# Patient Record
Sex: Female | Born: 1935 | Race: White | Hispanic: No | Marital: Married | Smoking: Former smoker
Health system: Southern US, Community
[De-identification: ages and names within clinical notes are randomized; demographics above are authoritative.]

## PROBLEM LIST (undated history)

## (undated) DIAGNOSIS — I509 Heart failure, unspecified: Secondary | ICD-10-CM

## (undated) DIAGNOSIS — E039 Hypothyroidism, unspecified: Secondary | ICD-10-CM

## (undated) DIAGNOSIS — I779 Disorder of arteries and arterioles, unspecified: Secondary | ICD-10-CM

## (undated) DIAGNOSIS — I10 Essential (primary) hypertension: Secondary | ICD-10-CM

## (undated) DIAGNOSIS — E785 Hyperlipidemia, unspecified: Secondary | ICD-10-CM

## (undated) DIAGNOSIS — R0602 Shortness of breath: Secondary | ICD-10-CM

## (undated) DIAGNOSIS — G459 Transient cerebral ischemic attack, unspecified: Secondary | ICD-10-CM

## (undated) DIAGNOSIS — I739 Peripheral vascular disease, unspecified: Secondary | ICD-10-CM

## (undated) DIAGNOSIS — I4891 Unspecified atrial fibrillation: Secondary | ICD-10-CM

## (undated) HISTORY — DX: Hyperlipidemia, unspecified: E78.5

## (undated) HISTORY — DX: Hypothyroidism, unspecified: E03.9

## (undated) HISTORY — DX: Disorder of arteries and arterioles, unspecified: I77.9

## (undated) HISTORY — DX: Shortness of breath: R06.02

## (undated) HISTORY — DX: Essential (primary) hypertension: I10

## (undated) HISTORY — DX: Peripheral vascular disease, unspecified: I73.9

## (undated) HISTORY — DX: Unspecified atrial fibrillation: I48.91

## (undated) HISTORY — DX: Transient cerebral ischemic attack, unspecified: G45.9

## (undated) HISTORY — PX: CHOLECYSTECTOMY: SHX55

---

## 1973-12-23 HISTORY — PX: ABDOMINAL HYSTERECTOMY: SHX81

## 1998-05-04 ENCOUNTER — Ambulatory Visit (HOSPITAL_COMMUNITY): Admission: RE | Admit: 1998-05-04 | Discharge: 1998-05-04 | Payer: Self-pay | Admitting: Pediatrics

## 1998-06-01 ENCOUNTER — Ambulatory Visit: Admission: RE | Admit: 1998-06-01 | Discharge: 1998-06-01 | Payer: Self-pay | Admitting: Vascular Surgery

## 1998-06-01 ENCOUNTER — Ambulatory Visit (HOSPITAL_COMMUNITY): Admission: RE | Admit: 1998-06-01 | Discharge: 1998-06-01 | Payer: Self-pay | Admitting: Vascular Surgery

## 1998-06-15 ENCOUNTER — Inpatient Hospital Stay: Admission: RE | Admit: 1998-06-15 | Discharge: 1998-06-16 | Payer: Self-pay | Admitting: Vascular Surgery

## 1999-12-24 DIAGNOSIS — G459 Transient cerebral ischemic attack, unspecified: Secondary | ICD-10-CM

## 1999-12-24 HISTORY — PX: CAROTID ENDARTERECTOMY: SUR193

## 1999-12-24 HISTORY — DX: Transient cerebral ischemic attack, unspecified: G45.9

## 2000-04-09 ENCOUNTER — Encounter: Payer: Self-pay | Admitting: Internal Medicine

## 2000-04-09 ENCOUNTER — Ambulatory Visit (HOSPITAL_COMMUNITY): Admission: RE | Admit: 2000-04-09 | Discharge: 2000-04-09 | Payer: Self-pay | Admitting: Obstetrics

## 2007-02-23 ENCOUNTER — Emergency Department (HOSPITAL_COMMUNITY): Admission: EM | Admit: 2007-02-23 | Discharge: 2007-02-23 | Payer: Self-pay | Admitting: Emergency Medicine

## 2007-02-27 ENCOUNTER — Ambulatory Visit: Payer: Self-pay | Admitting: Family Medicine

## 2007-02-27 LAB — CONVERTED CEMR LAB
ALT: 24 units/L (ref 0–35)
AST: 37 units/L (ref 0–37)
BUN: 14 mg/dL (ref 6–23)
Bilirubin, Direct: <0.1 mg/dL (ref 0.0–0.3)
CO2: 27 meq/L (ref 19–32)
Calcium: 10.3 mg/dL (ref 8.4–10.5)
Cholesterol: 246 mg/dL — ABNORMAL HIGH (ref 0–200)
Eosinophils Absolute: 0.2 10*3/uL (ref 0.0–0.7)
Eosinophils Relative: 3 % (ref 0–5)
Glucose, Bld: 77 mg/dL (ref 70–99)
HCT: 43.6 % (ref 36.0–46.0)
Hemoglobin: 14.4 g/dL (ref 12.0–15.0)
Lymphocytes Relative: 34 % (ref 12–46)
Lymphs Abs: 1.9 10*3/uL (ref 0.7–3.3)
MCV: 101.2 fL — ABNORMAL HIGH (ref 78.0–100.0)
Monocytes Absolute: 0.4 10*3/uL (ref 0.2–0.7)
Monocytes Relative: 8 % (ref 3–11)
RBC: 4.31 M/uL (ref 3.87–5.11)
T4, Total: 2.5 ug/dL — ABNORMAL LOW (ref 5.0–12.5)
TSH: 88.715 microintl units/mL — ABNORMAL HIGH (ref 0.350–5.50)
Total Bilirubin: 0.5 mg/dL (ref 0.3–1.2)
WBC: 5.5 10*3/uL (ref 4.0–10.5)

## 2007-03-07 ENCOUNTER — Emergency Department (HOSPITAL_COMMUNITY): Admission: EM | Admit: 2007-03-07 | Discharge: 2007-03-08 | Payer: Self-pay | Admitting: Emergency Medicine

## 2007-03-12 ENCOUNTER — Ambulatory Visit: Payer: Self-pay | Admitting: Family Medicine

## 2007-03-18 ENCOUNTER — Ambulatory Visit: Payer: Self-pay | Admitting: Family Medicine

## 2007-03-18 LAB — CONVERTED CEMR LAB
Free T4: 0.4 ng/dL — ABNORMAL LOW (ref 0.6–1.6)
T3 Uptake Ratio: 33.4 % (ref 22.5–37.0)
T3, Free: 2.4 pg/mL (ref 2.3–4.2)

## 2007-03-20 ENCOUNTER — Ambulatory Visit: Payer: Self-pay | Admitting: Family Medicine

## 2007-03-26 ENCOUNTER — Ambulatory Visit: Payer: Self-pay

## 2007-03-27 ENCOUNTER — Encounter: Admission: RE | Admit: 2007-03-27 | Discharge: 2007-03-27 | Payer: Self-pay | Admitting: Family Medicine

## 2007-03-30 ENCOUNTER — Ambulatory Visit: Payer: Self-pay | Admitting: Family Medicine

## 2007-04-08 ENCOUNTER — Encounter: Admission: RE | Admit: 2007-04-08 | Discharge: 2007-04-08 | Payer: Self-pay | Admitting: Family Medicine

## 2007-04-15 ENCOUNTER — Ambulatory Visit: Payer: Self-pay | Admitting: Family Medicine

## 2007-04-22 ENCOUNTER — Ambulatory Visit: Payer: Self-pay | Admitting: Internal Medicine

## 2007-05-05 ENCOUNTER — Encounter: Payer: Self-pay | Admitting: Family Medicine

## 2007-06-10 ENCOUNTER — Encounter: Payer: Self-pay | Admitting: Family Medicine

## 2007-06-10 DIAGNOSIS — E039 Hypothyroidism, unspecified: Secondary | ICD-10-CM

## 2007-07-03 ENCOUNTER — Telehealth: Payer: Self-pay | Admitting: Family Medicine

## 2007-07-03 ENCOUNTER — Ambulatory Visit: Payer: Self-pay | Admitting: Family Medicine

## 2007-07-03 DIAGNOSIS — I10 Essential (primary) hypertension: Secondary | ICD-10-CM

## 2007-07-03 DIAGNOSIS — E785 Hyperlipidemia, unspecified: Secondary | ICD-10-CM | POA: Insufficient documentation

## 2007-07-07 LAB — CONVERTED CEMR LAB
ALT: 32 units/L (ref 0–35)
AST: 44 units/L — ABNORMAL HIGH (ref 0–37)
Albumin: 3.9 g/dL (ref 3.5–5.2)
Calcium: 9.1 mg/dL (ref 8.4–10.5)
Chloride: 103 meq/L (ref 96–112)
Cholesterol: 192 mg/dL (ref 0–200)
Creatinine, Ser: 0.8 mg/dL (ref 0.4–1.2)
GFR calc non Af Amer: 76 mL/min
HDL: 60.4 mg/dL (ref >39.0)
LDL Cholesterol: 107 mg/dL — ABNORMAL HIGH (ref 0–99)
Sodium: 139 meq/L (ref 135–145)
Total Bilirubin: 0.8 mg/dL (ref 0.3–1.2)
VLDL: 25 mg/dL (ref 0–40)

## 2007-07-21 ENCOUNTER — Ambulatory Visit: Payer: Self-pay | Admitting: Family Medicine

## 2007-07-25 ENCOUNTER — Encounter: Payer: Self-pay | Admitting: Family Medicine

## 2007-07-30 LAB — CONVERTED CEMR LAB
Metaneph Total, Ur: 193 ug/24hr (ref 95–475)
Metanephrines, Ur: 50 (ref 19–140)
Normetanephrine, 24H Ur: 143 (ref 52–310)

## 2007-08-14 ENCOUNTER — Ambulatory Visit: Payer: Self-pay | Admitting: Family Medicine

## 2007-09-29 ENCOUNTER — Telehealth (INDEPENDENT_AMBULATORY_CARE_PROVIDER_SITE_OTHER): Payer: Self-pay | Admitting: *Deleted

## 2007-09-29 HISTORY — PX: DOPPLER ECHOCARDIOGRAPHY: SHX263

## 2007-11-17 ENCOUNTER — Ambulatory Visit: Payer: Self-pay | Admitting: Family Medicine

## 2007-11-17 DIAGNOSIS — M545 Low back pain: Secondary | ICD-10-CM

## 2007-11-26 ENCOUNTER — Encounter: Payer: Self-pay | Admitting: Family Medicine

## 2007-12-13 ENCOUNTER — Emergency Department (HOSPITAL_COMMUNITY): Admission: EM | Admit: 2007-12-13 | Discharge: 2007-12-13 | Payer: Self-pay | Admitting: Emergency Medicine

## 2007-12-14 ENCOUNTER — Ambulatory Visit: Payer: Self-pay | Admitting: Internal Medicine

## 2007-12-14 ENCOUNTER — Telehealth (INDEPENDENT_AMBULATORY_CARE_PROVIDER_SITE_OTHER): Payer: Self-pay | Admitting: *Deleted

## 2008-03-31 ENCOUNTER — Encounter: Payer: Self-pay | Admitting: Family Medicine

## 2008-06-06 ENCOUNTER — Encounter: Payer: Self-pay | Admitting: Family Medicine

## 2009-02-01 ENCOUNTER — Encounter: Payer: Self-pay | Admitting: Family Medicine

## 2009-02-23 ENCOUNTER — Ambulatory Visit: Payer: Self-pay | Admitting: Family Medicine

## 2009-02-23 DIAGNOSIS — M899 Disorder of bone, unspecified: Secondary | ICD-10-CM | POA: Insufficient documentation

## 2009-02-23 DIAGNOSIS — M949 Disorder of cartilage, unspecified: Secondary | ICD-10-CM

## 2009-04-10 ENCOUNTER — Encounter: Admission: RE | Admit: 2009-04-10 | Discharge: 2009-04-10 | Payer: Self-pay | Admitting: Family Medicine

## 2009-04-10 ENCOUNTER — Encounter: Payer: Self-pay | Admitting: Family Medicine

## 2009-04-12 ENCOUNTER — Encounter (INDEPENDENT_AMBULATORY_CARE_PROVIDER_SITE_OTHER): Payer: Self-pay | Admitting: *Deleted

## 2009-06-28 ENCOUNTER — Ambulatory Visit: Payer: Self-pay | Admitting: Family Medicine

## 2009-10-12 ENCOUNTER — Ambulatory Visit: Payer: Self-pay | Admitting: Family Medicine

## 2009-10-12 DIAGNOSIS — M7660 Achilles tendinitis, unspecified leg: Secondary | ICD-10-CM

## 2010-01-09 ENCOUNTER — Emergency Department (HOSPITAL_COMMUNITY): Admission: EM | Admit: 2010-01-09 | Discharge: 2010-01-10 | Payer: Self-pay | Admitting: Emergency Medicine

## 2010-01-19 ENCOUNTER — Encounter: Payer: Self-pay | Admitting: Family Medicine

## 2010-02-13 HISTORY — PX: OTHER SURGICAL HISTORY: SHX169

## 2010-02-13 HISTORY — PX: NM MYOVIEW LTD: HXRAD82

## 2010-02-13 HISTORY — PX: DOPPLER ECHOCARDIOGRAPHY: SHX263

## 2010-02-19 ENCOUNTER — Encounter: Payer: Self-pay | Admitting: Family Medicine

## 2010-03-28 ENCOUNTER — Ambulatory Visit: Payer: Self-pay | Admitting: Family Medicine

## 2010-03-28 DIAGNOSIS — L293 Anogenital pruritus, unspecified: Secondary | ICD-10-CM

## 2010-03-28 DIAGNOSIS — J019 Acute sinusitis, unspecified: Secondary | ICD-10-CM

## 2010-05-11 ENCOUNTER — Ambulatory Visit: Payer: Self-pay | Admitting: Family Medicine

## 2010-05-11 ENCOUNTER — Telehealth (INDEPENDENT_AMBULATORY_CARE_PROVIDER_SITE_OTHER): Payer: Self-pay | Admitting: *Deleted

## 2010-05-11 DIAGNOSIS — M546 Pain in thoracic spine: Secondary | ICD-10-CM

## 2010-06-21 ENCOUNTER — Ambulatory Visit: Payer: Self-pay | Admitting: Family Medicine

## 2010-06-21 ENCOUNTER — Telehealth (INDEPENDENT_AMBULATORY_CARE_PROVIDER_SITE_OTHER): Payer: Self-pay | Admitting: *Deleted

## 2010-06-21 DIAGNOSIS — M79609 Pain in unspecified limb: Secondary | ICD-10-CM | POA: Insufficient documentation

## 2010-06-22 ENCOUNTER — Encounter: Payer: Self-pay | Admitting: Family Medicine

## 2010-06-22 ENCOUNTER — Ambulatory Visit: Payer: Self-pay

## 2010-09-18 ENCOUNTER — Encounter: Payer: Self-pay | Admitting: Family Medicine

## 2010-11-01 HISTORY — PX: OTHER SURGICAL HISTORY: SHX169

## 2011-01-22 NOTE — Assessment & Plan Note (Signed)
Summary: sharp pain in side//lch   Vital Signs:  Patient profile:   76 year old female Weight:      184 pounds Pulse rate:   70 / minute Pulse rhythm:   regular BP sitting:   130 / 82  (left arm) Cuff size:   large  Vitals Entered By: Army Fossa CMA (May 11, 2010 11:29 AM) CC: Pt here c/o upper back pain that goes up to her shoulder onset Monday. , Back Pain   History of Present Illness:       This is a 76 year old woman who presents with Back Pain.  The symptoms began 5 days ago.  The patient denies fever, chills, weakness, loss of sensation, fecal incontinence, urinary incontinence, urinary retention, dysuria, rest pain, inability to work, and inability to care for self.  The pain is located in the mid thoracic back.  The pain began at home and suddenly.  The pain radiates to the right flank.  The pain is made worse by lying down, standing or walking, and activity.  The pain is made better by inactivity and acetaminophen.    Current Medications (verified): 1)  Fosamax 70 Mg  Tabs (Alendronate Sodium) .... Once Weekly 2)  Adult Aspirin Low Strength 81 Mg  Tbdp (Aspirin) .Marland Kitchen.. 1 Once Daily 3)  Synthroid 100 Mcg  Tabs (Levothyroxine Sodium) .Marland Kitchen.. 1 By Mouth Qd 4)  Lisinopril-Hydrochlorothiazide 20-12.5 Mg  Tabs (Lisinopril-Hydrochlorothiazide) .Marland Kitchen.. 1 [place] in Am 1 By Mouth in Pm 5)  Calcium 600mg  .... 1 By Mouth Bid 6)  Magnesium .Marland Kitchen.. 1 By Mouth Qd 7)  Atenolol 50 Mg Tabs (Atenolol) .... Take 1 Tab By Mouth Every Day 8)  Vitamin D3 1000 Unit Caps (Cholecalciferol) .... 2 By Mouth Once Daily 9)  Pravachol 20 Mg Tabs (Pravastatin Sodium) .Marland Kitchen.. 1 By Mouth Daily  Allergies (verified): No Known Drug Allergies  Past History:  Past medical, surgical, family and social histories (including risk factors) reviewed for relevance to current acute and chronic problems.  Past Medical History: Reviewed history from 07/03/2007 and no changes  required. Hyperlipidemia Hypertension Hypothyroidism Transient ischemic attack, hx of (2001)  Past Surgical History: Reviewed history from 07/03/2007 and no changes required. Cholecystectomy Hysterectomy (1975), TAH, BSO Carotid endarterectomy (2001), Left  Family History: Reviewed history and no changes required.  Social History: Reviewed history and no changes required.  Physical Exam  General:  Well-developed,well-nourished,in no acute distress; alert,appropriate and cooperative throughout examination Msk:  + muscle spasm/ tenderness R side paraspinally Extremities:  No clubbing, cyanosis, edema, or deformity noted with normal full range of motion of all joints.   Neurologic:  No cranial nerve deficits noted. Station and gait are normal. Plantar reflexes are down-going bilaterally. DTRs are symmetrical throughout. Sensory, motor and coordinative functions appear intact. Skin:  Intact without suspicious lesions or rashes Psych:  Oriented X3 and normally interactive.     Impression & Recommendations:  Problem # 1:  PAIN IN THORACIC SPINE (ICD-724.1)  Her updated medication list for this problem includes:    Adult Aspirin Low Strength 81 Mg Tbdp (Aspirin) .Marland Kitchen... 1 once daily    Ultram 50 Mg Tabs (Tramadol hcl) .Marland Kitchen... 1 by mouth every 6 hours as needed    Flexeril 10 Mg Tabs (Cyclobenzaprine hcl) .Marland Kitchen... 1 by mouth three times a day as needed as needed  Orders: T-2 View CXR ([id number]) T-Thoracic Spine 2 Views [phone])  Discussed use of moist heat or ice, modified activities, medications, and stretching/strengthening exercises. Back  care instructions given. To be seen in 2 weeks if no improvement; sooner if worsening of symptoms.   Complete Medication List: 1)  Fosamax 70 Mg Tabs (Alendronate sodium) .... Once weekly 2)  Adult Aspirin Low Strength 81 Mg Tbdp (Aspirin) .Marland Kitchen.. 1 once daily 3)  Synthroid 100 Mcg Tabs (Levothyroxine sodium) .Marland Kitchen.. 1 by mouth qd 4)   Lisinopril-hydrochlorothiazide 20-12.5 Mg Tabs (Lisinopril-hydrochlorothiazide) .Marland Kitchen.. 1 bymouth in am 1 by mouth in pm 5)  Calcium 600mg   .... 1 by mouth bid 6)  Magnesium  .Marland KitchenMarland Kitchen. 1 by mouth qd 7)  Atenolol 50 Mg Tabs (Atenolol) .... Take 1 tab by mouth every day 8)  Vitamin D3 1000 Unit Caps (Cholecalciferol) .... 2 by mouth once daily 9)  Pravachol 20 Mg Tabs (Pravastatin sodium) .Marland Kitchen.. 1 by mouth daily 10)  Ultram 50 Mg Tabs (Tramadol hcl) .Marland Kitchen.. 1 by mouth every 6 hours as needed 11)  Flexeril 10 Mg Tabs (Cyclobenzaprine hcl) .Marland Kitchen.. 1 by mouth three times a day as needed as needed Prescriptions: FLEXERIL 10 MG TABS (CYCLOBENZAPRINE HCL) 1 by mouth three times a day as needed as needed  #30 x 0   Entered and Authorized by:   Loreen Freud DO   Signed by:   Loreen Freud DO on 05/11/2010   Method used:   Electronically to        [facility]  [phone]* (retail)       [address]       [place], [place]  [address]       Ph: [phone] or [phone]       Fax: [phone]   RxID:   [id number] ULTRAM 50 MG TABS (TRAMADOL HCL) 1 by mouth every 6 hours as needed  #60 x 1   Entered and Authorized by:   Loreen Freud DO   Signed by:   Loreen Freud DO on 05/11/2010   Method used:   Electronically to        [facility]  [phone]* (retail)       [address]       [place], [place]  [address]       Ph: [phone] or [phone]       Fax: [phone]   RxID:   [id number]

## 2011-01-22 NOTE — Miscellaneous (Signed)
Summary: Orders Update  Clinical Lists Changes  Orders: Added new Test order of Venous Duplex Lower Extremity (Venous Duplex Lower) - Signed 

## 2011-01-22 NOTE — Letter (Signed)
Summary: [facility]  [facility]   Imported By: Lanelle Bal 09/28/2010 14:06:25  _____________________________________________________________________  External Attachment:    Type:   Image     Comment:   External Document

## 2011-01-22 NOTE — Assessment & Plan Note (Signed)
Summary: LEFT FOOT SWOLLEN--PH   Vital Signs:  Patient profile:   76 year old female Weight:      185 pounds Pulse rate:   46 / minute BP sitting:   130 / 80  (left arm)  Vitals Entered By: Doristine Devoid (June 21, 2010 11:46 AM) CC: L foot swollen and discolored    History of Present Illness: 76 yo woman here today for L foot pain.  sxs started Tuesday (9 days ago).  'had a great big bump'- points to lateral malleolus.  last night foot became discolored.  no pain w/ walking, only palpation.  no recent immobility or long trips.  no hx of clots.  takes ASA daily.  denies injury to ankle or lower leg.  ambulating normally, able to still wear heels.  Current Medications (verified): 1)  Fosamax 70 Mg  Tabs (Alendronate Sodium) .... Once Weekly 2)  Adult Aspirin Low Strength 81 Mg  Tbdp (Aspirin) .Marland Kitchen.. 1 Once Daily 3)  Synthroid 100 Mcg  Tabs (Levothyroxine Sodium) .Marland Kitchen.. 1 By Mouth Qd 4)  Lisinopril-Hydrochlorothiazide 20-12.5 Mg  Tabs (Lisinopril-Hydrochlorothiazide) .Marland Kitchen.. 1 [place] in Am 1 By Mouth in Pm 5)  Calcium 1500 Mg Tabs (Calcium Carbonate) .... 3 By Mouth Daily. 6)  Magnesium .Marland Kitchen.. 1 By Mouth Qd 7)  Atenolol 50 Mg Tabs (Atenolol) .... Take 1 Tab By Mouth Every Day 8)  Pravachol 20 Mg Tabs (Pravastatin Sodium) .Marland Kitchen.. 1 By Mouth Daily 9)  Vitamin D 2000 Unit Tabs (Cholecalciferol) .... Take One Tablet Daily  Allergies (verified): No Known Drug Allergies  Review of Systems      See HPI  Physical Exam  General:  Well-developed,well-nourished,in no acute distress; alert,appropriate and cooperative throughout examination Pulses:  +2 DP/PT Extremities:  + swelling and induration superior to L lateral malleolus, + TTP.  full ROM of L ankle.  + discoloration of dorsum of L foot consistent w/ bruising. Neurologic:  sensation intact to light touch.   Skin:  numerous broken superficial vessels on L lower leg   Impression & Recommendations:  Problem # 1:  LEG PAIN, LEFT  (ICD-729.5) Assessment New pt's lump on lower leg only painful to palpation.  able to ambulate normally and still wear heels.  discoloration of foot is likely due to gravity while actual injury appears to be just above lateral malleolus.  pt doesn't recall trauma.  + broken superficial vessels on the skin.  check xrays.  if no yield, despite low suspicion for clot will check [place].  reviewed supportive care and red flags that should prompt return.  Pt expresses understanding and is in agreement w/ this plan. Orders: T-Ankle Comp Left Min 3 Views ([id number])  Complete Medication List: 1)  Fosamax 70 Mg Tabs (Alendronate sodium) .... Once weekly 2)  Adult Aspirin Low Strength 81 Mg Tbdp (Aspirin) .Marland Kitchen.. 1 once daily 3)  Synthroid 100 Mcg Tabs (Levothyroxine sodium) .Marland Kitchen.. 1 by mouth qd 4)  Lisinopril-hydrochlorothiazide 20-12.5 Mg Tabs (Lisinopril-hydrochlorothiazide) .Marland Kitchen.. 1 bymouth in am 1 by mouth in pm 5)  Calcium 1500 Mg Tabs (Calcium carbonate) .... 3 by mouth daily. 6)  Magnesium  .Marland KitchenMarland Kitchen. 1 by mouth qd 7)  Atenolol 50 Mg Tabs (Atenolol) .... Take 1 tab by mouth every day 8)  Pravachol 20 Mg Tabs (Pravastatin sodium) .Marland Kitchen.. 1 by mouth daily 9)  Vitamin D 2000 Unit Tabs (Cholecalciferol) .... Take one tablet daily  Patient Instructions: 1)  Go to [address] for your xrays- we'll call you with  your results 2)  Ice, elevate, and Aleve (2 aleve every 12 hrs)- TAKE W/ FOOD!! 3)  If no info from xrays will proceed w/ Ultrasound 4)  Hang in there!!!

## 2011-01-22 NOTE — Progress Notes (Signed)
Summary: xray report  Phone Note Outgoing Call   Call placed by: Doristine Devoid,  June 21, 2010 4:31 PM Call placed to: Patient Summary of Call: no obvious injury to ankle or lower leg.  please arrange for pt to have LE doppler done tomorrow and let her know  Follow-up for Phone Call        patient aware of xray and appt for doppler to be set up tomorrow.......Marland KitchenDoristine Devoid  June 21, 2010 4:31 PM

## 2011-01-22 NOTE — Progress Notes (Signed)
Summary: X-ray Result  Phone Note Outgoing Call   Call placed by: Army Fossa CMA,  May 11, 2010 5:00 PM Reason for Call: Discuss lab or test results Summary of Call: X-ray Results, LMTCB:  arthritic changes and osteopenia call if symptoms do not improve take calcium 1500mg  daily with 1000u vita D Signed by Loreen Freud DO on 05/11/2010 at 4:57 PM  chest x-ray: Nothing acute   Follow-up for Phone Call        Pt is aware, will call if symptoms do not improve. Army Fossa CMA  May 14, 2010 9:18 AM     New/Updated Medications: CALCIUM 1500 MG TABS (CALCIUM CARBONATE) 1 by mouth daily.

## 2011-01-22 NOTE — Letter (Signed)
Summary: [facility]  [facility]   Imported By: Lanelle Bal 01/31/2010 10:25:23  _____________________________________________________________________  External Attachment:    Type:   Image     Comment:   External Document

## 2011-01-22 NOTE — Letter (Signed)
Summary: [facility]  [facility]   Imported By: Lanelle Bal 02/26/2010 12:19:01  _____________________________________________________________________  External Attachment:    Type:   Image     Comment:   External Document

## 2011-01-22 NOTE — Assessment & Plan Note (Signed)
Summary: COLD?/KDC   Vital Signs:  Patient profile:   76 year old female Height:      65.5 inches Weight:      183.25 pounds BMI:     30.14 Pulse rate:   60 / minute BP sitting:   120 / 80  Vitals Entered By: Kandice Hams (March 28, 2010 8:19 AM) CC: c/o cough keeps me up at night, raspy throat, denies fever no bodyaches, Cough   History of Present Illness:  Cough      This is a 76 year old woman who presents with Cough.  The symptoms began 6 days ago.  Pt started coughing since Thursday driving back from [place].  The patient reports productive cough, but denies non-productive cough, pleuritic chest pain, shortness of breath, wheezing, exertional dyspnea, fever, hemoptysis, and malaise.  Associated symtpoms include cold/URI symptoms, nasal congestion, and chronic rhinitis.  The patient denies the following symptoms: sore throat, weight loss, acid reflux symptoms, and peripheral edema.  The cough is worse with lying down.  Ineffective prior treatments have included OTC cough medication.    Allergies: No Known Drug Allergies  Physical Exam  General:  Well-developed,well-nourished,in no acute distress; alert,appropriate and cooperative throughout examination Ears:  External ear exam shows no significant lesions or deformities.  Otoscopic examination reveals clear canals, tympanic membranes are intact bilaterally without bulging, retraction, inflammation or discharge. Hearing is grossly normal bilaterally. Nose:  L maxillary sinus tenderness and R maxillary sinus tenderness.   Mouth:  Oral mucosa and oropharynx without lesions or exudates.  Teeth in good repair. Neck:  No deformities, masses, or tenderness noted. Lungs:  Normal respiratory effort, chest expands symmetrically. Lungs are clear to auscultation, no crackles or wheezes. Heart:  normal rate.   Psych:  Oriented X3 and normally interactive.     Impression & Recommendations:  Problem # 1:  ACUTE SINUSITIS, UNSPECIFIED  (ICD-461.9)  Her updated medication list for this problem includes:    Augmentin 875-125 Mg Tabs (Amoxicillin-pot clavulanate) .Marland Kitchen... 1 by mouth two times a day    Cheratussin Ac 100-10 Mg/51ml Syrp (Guaifenesin-codeine) .Marland Kitchen... 1-2 tsp by mouth at bedtime  Instructed on treatment. Call if symptoms persist or worsen.   Problem # 2:  VAGINAL PRURITUS (ICD-698.1) lotrisone cream externally diflucan secondary to abx rto after tx for sinusitis and we can do gyn exam  Complete Medication List: 1)  Fosamax 70 Mg Tabs (Alendronate sodium) .... Once weekly 2)  Adult Aspirin Low Strength 81 Mg Tbdp (Aspirin) .Marland Kitchen.. 1 once daily 3)  Synthroid 100 Mcg Tabs (Levothyroxine sodium) .Marland Kitchen.. 1 by mouth qd 4)  Lisinopril-hydrochlorothiazide 20-12.5 Mg Tabs (Lisinopril-hydrochlorothiazide) .Marland Kitchen.. 1 bymouth in am 1 by mouth in pm 5)  Calcium 600mg   .... 1 by mouth bid 6)  Magnesium  .Marland KitchenMarland Kitchen. 1 by mouth qd 7)  Atenolol 50 Mg Tabs (Atenolol) .... Take 1 tab by mouth every day 8)  Vitamin D3 1000 Unit Caps (Cholecalciferol) .... 2 by mouth once daily 9)  Pravachol 20 Mg Tabs (Pravastatin sodium) .Marland Kitchen.. 1 by mouth daily 10)  Augmentin 875-125 Mg Tabs (Amoxicillin-pot clavulanate) .Marland Kitchen.. 1 by mouth two times a day 11)  Cheratussin Ac 100-10 Mg/78ml Syrp (Guaifenesin-codeine) .Marland Kitchen.. 1-2 tsp by mouth at bedtime 12)  Lotrisone 1-0.05 % Crea (Clotrimazole-betamethasone) .... Apply two times a day 13)  Fluconazole 150 Mg Tabs (Fluconazole) .Marland Kitchen.. 1 by mouth x1 ,   may repeat 3 days as needed Prescriptions: FLUCONAZOLE 150 MG TABS (FLUCONAZOLE) 1 by mouth x1 ,  may repeat 3 days as needed  #2 x 0   Entered and Authorized by:   Loreen Freud DO   Signed by:   Loreen Freud DO on 03/28/2010   Method used:   Electronically to        [facility]  [phone]* (retail)       [address]       [place], [place]  [address]       Ph: [phone] or [phone]       Fax: [phone]   RxID:    [id number] LOTRISONE 1-0.05 % CREA (CLOTRIMAZOLE-BETAMETHASONE) APPLY two times a day  #30 g x 1   Entered and Authorized by:   Loreen Freud DO   Signed by:   Loreen Freud DO on 03/28/2010   Method used:   Electronically to        [facility]  [phone]* (retail)       [address]       [place], [place]  [address]       Ph: [phone] or [phone]       Fax: [phone]   RxID:   [id number] CHERATUSSIN AC 100-10 MG/5ML SYRP (GUAIFENESIN-CODEINE) 1-2 tsp by mouth at bedtime  #6 oz x 0   Entered and Authorized by:   Loreen Freud DO   Signed by:   Loreen Freud DO on 03/28/2010   Method used:   Print then Give to Patient   RxID:   [id number] AUGMENTIN 875-125 MG TABS (AMOXICILLIN-POT CLAVULANATE) 1 by mouth two times a day  #20 x 0   Entered and Authorized by:   Loreen Freud DO   Signed by:   Loreen Freud DO on 03/28/2010   Method used:   Electronically to        [facility]  [phone]* (retail)       [address]       [place], [place]  [address]       Ph: [phone] or [phone]       Fax: [phone]   RxID:   [id number]

## 2011-01-22 NOTE — Letter (Signed)
Summary: Results Follow-up Letter  [facility]  [address] [place], [place] [address]   Phone: [phone]  Fax: [phone]    04/12/2009       [facility] [address] [place], [place]  [address]  Dear Ms. NORLANDER,   The following are the results of your recent test(s):  Test     Result     Pap Smear    Normal_______  Not Normal_____       Comments: _________________________________________________________ Cholesterol LDL(Bad cholesterol):          Your goal is less than:         HDL (Good cholesterol):        Your goal is more than: _________________________________________________________ Other Tests:   _________________________________________________________  Please call for an appointment Or PLEASE SEE ATTACHED BONE DENSITY_________________________________________________________ _________________________________________________________ _________________________________________________________  Sincerely,  Ardyth Man [place] at [facility]

## 2011-03-11 LAB — URINALYSIS, ROUTINE W REFLEX MICROSCOPIC
Glucose, UA: NEGATIVE mg/dL
Ketones, ur: NEGATIVE mg/dL
Nitrite: NEGATIVE
Protein, ur: NEGATIVE mg/dL
Urobilinogen, UA: 1 mg/dL (ref 0.0–1.0)

## 2011-03-11 LAB — POCT I-STAT, CHEM 8
BUN: 12 mg/dL (ref 6–23)
Chloride: 108 mEq/L (ref 96–112)
Glucose, Bld: 104 mg/dL — ABNORMAL HIGH (ref 70–99)
HCT: 37 % (ref 36.0–46.0)
Potassium: 4.1 mEq/L (ref 3.5–5.1)

## 2011-03-11 LAB — URINE MICROSCOPIC-ADD ON

## 2011-05-10 NOTE — Assessment & Plan Note (Signed)
[facility]                                 ON-CALL NOTE   ALEYNA, CUEVA                         MRN:          [id number]  DATE:03/21/2007                            DOB:          1935-03-10    Mr. Jonita Albee is the caller at 8:44 a.m., phone number is [phone].  Dr.  Laury Axon is her regular doctor.   CHIEF COMPLAINT:  High blood pressure.  Mr. Jonita Albee states that Mrs.  Jonita Albee has been seeing Dr. Laury Axon for a while for high blood pressure.  She was diagnosed with a thyroid problem, and recently had her medicine  cut in half.  They have been having a very difficult time getting her  blood pressure down, and it usually peaks in the morning, goes up into  the mid 200s over 90s.  He said they have been to the emergency room 2-3  times with no avail.  They aer currently waiting for an endocrinologist  appointment/referral from Dr. Laury Axon to get this under control.  When her  blood pressure goes up, she gets flushed, but otherwise feels okay.  Her  blood pressure this morning, he said, is 202/99, which is actually down  10 points from usual.  She is on Coreg and clonidine, currently.  I told  him that I could not give him the endocrinology referral over the  weekend, but I would certainly try to contact Dr. Laury Axon first thing  Monday to let her know that that is a pressing issue.  I told Mr. Jonita Albee  that if Mrs. Worden's blood pressure went up any more, or she began to  feel flushed again, or had any other symptoms, to either take her to the  emergency room or call back, and, otherwise, to continue her regular  medicines, and that is what they are going to do.     Marne A. Tower, MD  Electronically Signed    MAT/MedQ  DD: 03/21/2007  DT: 03/21/2007  Job #: [id number]   cc:   Lelon Perla, DO

## 2011-06-19 ENCOUNTER — Ambulatory Visit (INDEPENDENT_AMBULATORY_CARE_PROVIDER_SITE_OTHER): Payer: Medicare Other | Admitting: Internal Medicine

## 2011-06-19 ENCOUNTER — Encounter: Payer: Self-pay | Admitting: Internal Medicine

## 2011-06-19 VITALS — BP 124/86 | HR 58 | Wt 187.4 lb

## 2011-06-19 DIAGNOSIS — IMO0002 Reserved for concepts with insufficient information to code with codable children: Secondary | ICD-10-CM

## 2011-06-19 DIAGNOSIS — M5410 Radiculopathy, site unspecified: Secondary | ICD-10-CM

## 2011-06-19 MED ORDER — HYDROCODONE-ACETAMINOPHEN 7.5-750 MG PO TABS: 1.0000 | ORAL_TABLET | Freq: Four times a day (QID) | ORAL | Status: AC | PRN

## 2011-06-19 MED ORDER — PREDNISONE 10 MG PO TABS: ORAL_TABLET | ORAL | Status: AC

## 2011-06-19 NOTE — Assessment & Plan Note (Addendum)
Given distribution of the pain,  suspect a S1 radiculopathy : Recommend conservative treatment for 10 days, if not better, she will let me know. Will need further testing/referral

## 2011-06-19 NOTE — Patient Instructions (Signed)
Rest, no heavy lifting. Warm compress to the back Prednisone as prescribed Tylenol as needed for pain, hydrocodone if pain severe. Watch for drowsiness. Call in 10 days if not improving, call anytime if the symptoms are severe

## 2011-06-19 NOTE — Progress Notes (Signed)
  Subjective:    Patient ID: ESMAY AMSPACHER, female    DOB: 1935/10/16, 76 y.o.   MRN: [id number]  HPI 3 day history of pain in that right knee  from the buttock down following an S1 distribution. The pain is described as a steady ache, is worse when she tries to stand up and walk.  Past Medical History  Diagnosis Date  . Hyperlipidemia   . Hypertension   . Hypothyroidism   . TIA (transient ischemic attack) 2001    Past Surgical History  Procedure Date  . Cholecystectomy   . Abdominal hysterectomy 1975    TAH,BSO  . Carotid endarterectomy 2001    left    Review of Systems Denies any history of fall or previous injury No fever or chills No bladder or bowel incontinence. No gross hematuria Some paresthesias in the third fourth and fifth toes on the left. No lower extremity edema. No rash    Objective:   Physical Exam  Constitutional: She appears well-developed and well-nourished. No distress.  Cardiovascular:       Palpable pedal pulses bilaterally  Abdominal: Soft. She exhibits no distension. There is no tenderness. There is no rebound and no guarding.  Musculoskeletal: She exhibits no edema.       Walk with some difficulty due to pain  Neurological:       DTRs symmetrical and decreased in the ankles. DTR right knee normal, left knee decreased. Motor exam normal + straight leg test elevation on the left. Pinprick examination normal          Assessment & Plan:

## 2011-10-25 ENCOUNTER — Other Ambulatory Visit: Payer: Self-pay | Admitting: Family Medicine

## 2012-11-05 ENCOUNTER — Other Ambulatory Visit (HOSPITAL_COMMUNITY): Payer: Self-pay | Admitting: Cardiovascular Disease

## 2012-11-05 DIAGNOSIS — I6529 Occlusion and stenosis of unspecified carotid artery: Secondary | ICD-10-CM

## 2012-11-24 ENCOUNTER — Ambulatory Visit (HOSPITAL_COMMUNITY)
Admission: RE | Admit: 2012-11-24 | Discharge: 2012-11-24 | Disposition: A | Discharge status: Home / Self Care | Payer: Medicare Other | Source: Ambulatory Visit | Attending: Cardiovascular Disease | Admitting: Cardiovascular Disease

## 2012-11-24 DIAGNOSIS — I6529 Occlusion and stenosis of unspecified carotid artery: Secondary | ICD-10-CM | POA: Insufficient documentation

## 2012-11-24 HISTORY — PX: OTHER SURGICAL HISTORY: SHX169

## 2012-11-24 NOTE — Progress Notes (Signed)
Carotid Doppler completed. Kajsa Butrum D  

## 2013-07-15 ENCOUNTER — Encounter: Payer: Self-pay | Admitting: Cardiovascular Disease

## 2013-07-15 ENCOUNTER — Ambulatory Visit (INDEPENDENT_AMBULATORY_CARE_PROVIDER_SITE_OTHER): Payer: Medicare Other | Admitting: Cardiovascular Disease

## 2013-07-15 VITALS — BP 118/72 | HR 48 | Ht 65.0 in | Wt 174.0 lb

## 2013-07-15 DIAGNOSIS — I779 Disorder of arteries and arterioles, unspecified: Secondary | ICD-10-CM

## 2013-07-15 DIAGNOSIS — I739 Peripheral vascular disease, unspecified: Secondary | ICD-10-CM

## 2013-07-15 DIAGNOSIS — E785 Hyperlipidemia, unspecified: Secondary | ICD-10-CM

## 2013-07-15 DIAGNOSIS — I10 Essential (primary) hypertension: Secondary | ICD-10-CM

## 2013-07-15 NOTE — Assessment & Plan Note (Signed)
Well-controlled on current medications 

## 2013-07-15 NOTE — Patient Instructions (Addendum)
Your physician wants you to follow-up in: 12 months with Dr Berry. You will receive a reminder letter in the mail two months in advance. If you don't receive a letter, please call our office to schedule the follow-up appointment.  

## 2013-07-15 NOTE — Assessment & Plan Note (Signed)
On statin therapy followed by her PCP. Her most recent lipid profile performed 02/02/13 revealed a total cholesterol of 165, LDL of 89 and HDL of 59.

## 2013-07-15 NOTE — Assessment & Plan Note (Signed)
Status post TIA approximately 1111 and 12 years ago with left carotid endarterectomy performed by Dr. Tawanna Cooler early. We follow her in our office by duplex ultrasound annually.

## 2013-07-15 NOTE — Progress Notes (Signed)
07/15/2013 Colleen [name]   08-04-1935  [id number]  Primary Physician Colleen Freud, DO Primary Cardiologist: Runell Gess MD Roseanne Reno   HPI:  The patient is a 78 year old mildly to moderately overweight married Caucasian female, the mother of 2, grandmother to 5 grandchildren, whom I last saw 7 months ago. She has a history of PVOD, status post left carotid endarterectomy in the past preceded by a TIA approximately 10-12 years ago by Dr. Tawanna Cooler Early. We have been following her Dopplers here in our office. Her most recent carotid Dopplers, performed November 01, 2011, revealed moderate right ICA stenosis with a widely patent left endarterectomy site. Other problems include hypertension and hyperlipidemia. She quit smoking approximately 20 years ago. She denies chest pain or shortness of breath. Echo and Myoview performed February 2011 were normal. Dr. Talmage Nap follows her lipid profile. Since I last saw her 06/19/12 she has remained completely asymptomatic. She specifically denies chest pain or shortness of breath.      Current Outpatient Prescriptions  Medication Sig Dispense Refill  . aspirin 81 MG tablet Take 81 mg by mouth daily. 2 tablets      . atenolol (TENORMIN) 50 MG tablet Take 50 mg by mouth daily.        . Calcium Carbonate (GNP CALCIUM) 1500 MG TABS Take by mouth.        . Ergocalciferol (VITAMIN D2) 2000 UNITS TABS Take by mouth.        . levothyroxine (SYNTHROID, LEVOTHROID) 100 MCG tablet Take 100 mcg by mouth daily.        Marland Kitchen lisinopril-hydrochlorothiazide (PRINZIDE,ZESTORETIC) 20-12.5 MG per tablet 1 tablet. 1 by mouth in the pm.      . magnesium 30 MG tablet Take 30 mg by mouth 2 (two) times daily.        . niacin 500 MG tablet Take 500 mg by mouth daily with breakfast. otc      . Potassium 99 MG TABS Take 1 tablet by mouth daily. otc      . pravastatin (PRAVACHOL) 20 MG tablet Take 20 mg by mouth daily.        . vitamin B-12 (CYANOCOBALAMIN) 500 MCG tablet  Take 500 mcg by mouth daily.       No current facility-administered medications for this visit.    No Known Allergies  History   Social History  . Marital Status: Married    Spouse Name: N/A    Number of Children: N/A  . Years of Education: N/A   Occupational History  . Not on file.   Social History Main Topics  . Smoking status: Never Smoker   . Smokeless tobacco: Not on file  . Alcohol Use: Not on file  . Drug Use: Not on file  . Sexually Active: Not on file   Other Topics Concern  . Not on file   Social History Narrative  . No narrative on file     Review of Systems: General: negative for chills, fever, night sweats or weight changes.  Cardiovascular: negative for chest pain, dyspnea on exertion, edema, orthopnea, palpitations, paroxysmal nocturnal dyspnea or shortness of breath Dermatological: negative for rash Respiratory: negative for cough or wheezing Urologic: negative for hematuria Abdominal: negative for nausea, vomiting, diarrhea, bright red blood per rectum, melena, or hematemesis Neurologic: negative for visual changes, syncope, or dizziness All other systems reviewed and are otherwise negative except as noted above.    Blood pressure 118/72, pulse 48, height 5\' 5"  (1.651 m),  weight 174 lb (78.926 kg).  General appearance: alert and no distress Neck: no adenopathy, no carotid bruit, no JVD, supple, symmetrical, trachea midline and thyroid not enlarged, symmetric, no tenderness/mass/nodules Lungs: clear to auscultation bilaterally Heart: regular rate and rhythm, S1, S2 normal, no murmur, click, rub or gallop Extremities: extremities normal, atraumatic, no cyanosis or edema  EKG sinus bradycardia at 49 with a left axis deviation  ASSESSMENT AND PLAN:   HYPERLIPIDEMIA On statin therapy followed by her PCP. Her most recent lipid profile performed 02/02/13 revealed a total cholesterol of 165, LDL of 89 and HDL of 59.  Unspecified essential  hypertension Well-controlled on current medications  Carotid artery disease Status post TIA approximately 1111 and 12 years ago with left carotid endarterectomy performed by Dr. Tawanna Cooler early. We follow her in our office by duplex ultrasound annually.      Runell Gess MD FACP,FACC,FAHA, [facility] 07/15/2013 11:25 AM

## 2013-07-23 ENCOUNTER — Encounter: Payer: Self-pay | Admitting: Cardiovascular Disease

## 2013-09-22 ENCOUNTER — Other Ambulatory Visit: Payer: Self-pay | Admitting: *Deleted

## 2013-09-22 MED ORDER — LISINOPRIL-HYDROCHLOROTHIAZIDE 20-12.5 MG PO TABS: 1.0000 | ORAL_TABLET | Freq: Every day | ORAL | Status: DC

## 2014-02-04 ENCOUNTER — Other Ambulatory Visit (HOSPITAL_COMMUNITY): Payer: Self-pay | Admitting: Cardiovascular Disease

## 2014-02-04 DIAGNOSIS — I6529 Occlusion and stenosis of unspecified carotid artery: Secondary | ICD-10-CM

## 2014-02-11 ENCOUNTER — Ambulatory Visit (HOSPITAL_COMMUNITY)
Admission: RE | Admit: 2014-02-11 | Discharge: 2014-02-11 | Disposition: A | Discharge status: Home / Self Care | Payer: Medicare Other | Source: Ambulatory Visit | Attending: Internal Medicine | Admitting: Internal Medicine

## 2014-02-11 DIAGNOSIS — I672 Cerebral atherosclerosis: Secondary | ICD-10-CM | POA: Insufficient documentation

## 2014-02-11 DIAGNOSIS — I6529 Occlusion and stenosis of unspecified carotid artery: Secondary | ICD-10-CM

## 2014-02-11 NOTE — Progress Notes (Signed)
Carotid Duplex Completed. °Brianna L Mazza,RVT °

## 2014-02-27 ENCOUNTER — Telehealth: Payer: Self-pay | Admitting: *Deleted

## 2014-02-27 ENCOUNTER — Encounter: Payer: Self-pay | Admitting: *Deleted

## 2014-02-27 DIAGNOSIS — I6529 Occlusion and stenosis of unspecified carotid artery: Secondary | ICD-10-CM

## 2014-02-27 NOTE — Telephone Encounter (Signed)
Message copied by Chauncy Lean on Sun Feb 27, 2014 10:03 PM ------      Message from: Lorretta Harp      Created: Sun Feb 27, 2014  5:32 PM       No change from prior study. Repeat in 12 months. ------

## 2014-02-27 NOTE — Telephone Encounter (Signed)
Order placed for repeat carotid dopplers in 1 year  

## 2014-04-18 ENCOUNTER — Ambulatory Visit (INDEPENDENT_AMBULATORY_CARE_PROVIDER_SITE_OTHER): Payer: Medicare Other | Admitting: Family Medicine

## 2014-04-18 ENCOUNTER — Encounter: Payer: Self-pay | Admitting: Family Medicine

## 2014-04-18 VITALS — BP 122/84 | HR 56 | Temp 98.8°F | Ht 65.0 in | Wt 176.2 lb

## 2014-04-18 DIAGNOSIS — R197 Diarrhea, unspecified: Secondary | ICD-10-CM

## 2014-04-18 DIAGNOSIS — J209 Acute bronchitis, unspecified: Secondary | ICD-10-CM

## 2014-04-18 MED ORDER — PROMETHAZINE-DM 6.25-15 MG/5ML PO SYRP: 5.0000 mL | ORAL_SOLUTION | Freq: Four times a day (QID) | ORAL | Status: DC | PRN

## 2014-04-18 MED ORDER — AMOXICILLIN-POT CLAVULANATE 875-125 MG PO TABS
1.0000 | ORAL_TABLET | Freq: Two times a day (BID) | ORAL | Status: DC
Start: 2014-04-18 — End: 2014-09-23

## 2014-04-18 MED ORDER — FLUTICASONE PROPIONATE 50 MCG/ACT NA SUSP: 2.0000 | Freq: Every day | NASAL | Status: DC

## 2014-04-18 NOTE — Patient Instructions (Signed)

## 2014-04-18 NOTE — Progress Notes (Signed)
Pre visit review using our clinic review tool, if applicable. No additional management support is needed unless otherwise documented below in the visit note. 

## 2014-04-18 NOTE — Progress Notes (Signed)
  Subjective:     Colleen [name] is a 79 y.o. female who presents for evaluation of symptoms of a URI. Symptoms include bilateral ear pressure/pain, cough described as productive, nasal congestion and sore throat. Onset of symptoms was 5 days ago, and has been gradually worsening since that time. Treatment to date: antihistamines and cough suppressants.   The following portions of the patient's history were reviewed and updated as appropriate: allergies, current medications, past family history, past medical history, past social history, past surgical history and problem list. Review of Systems Pertinent items are noted in HPI.   Objective:    BP 122/84  Pulse 56  Temp(Src) 98.8 F (37.1 C) (Oral)  Ht 5\' 5"  (1.651 m)  Wt 176 lb 3.2 oz (79.924 kg)  BMI 29.32 kg/m2  SpO2 95% General appearance: alert, cooperative and no distress Ears: normal TM's and external ear canals both ears Nose: green discharge, moderate congestion, no sinus tenderness Throat: lips, mucosa, and tongue normal; teeth and gums normal Neck: moderate anterior cervical adenopathy, supple, symmetrical, trachea midline and thyroid not enlarged, symmetric, no tenderness/mass/nodules Lungs: rhonchi bilaterally Heart: S1, S2 normal   Assessment:    bronchitis   Plan:    Suggested symptomatic OTC remedies. Nasal saline spray for congestion. Augmentin per orders. Nasal steroids per orders. Follow up as needed.

## 2014-04-25 ENCOUNTER — Encounter: Payer: Self-pay | Admitting: Internal Medicine

## 2014-05-05 ENCOUNTER — Other Ambulatory Visit: Payer: Self-pay | Admitting: Cardiovascular Disease

## 2014-05-06 NOTE — Telephone Encounter (Signed)
Rx refill sent to patient pharmacy   

## 2014-06-16 ENCOUNTER — Ambulatory Visit: Payer: Medicare Other | Admitting: Internal Medicine

## 2014-07-11 ENCOUNTER — Other Ambulatory Visit: Payer: Self-pay | Admitting: *Deleted

## 2014-07-11 MED ORDER — LISINOPRIL-HYDROCHLOROTHIAZIDE 20-12.5 MG PO TABS: ORAL_TABLET | ORAL | Status: DC

## 2014-07-11 NOTE — Telephone Encounter (Signed)
Rx refill sent to patient pharmacy with note to make appointment for further refills

## 2014-08-12 ENCOUNTER — Other Ambulatory Visit: Payer: Self-pay | Admitting: Cardiovascular Disease

## 2014-08-12 NOTE — Telephone Encounter (Signed)
Spoke with patient husband as patient was unavailable and reminded him that patient needs to make an appointment before any further refills can be sent in. Message sent to schedulers to call patient for appointment.  Husband voiced understanding.

## 2014-09-23 ENCOUNTER — Encounter: Payer: Self-pay | Admitting: Cardiovascular Disease

## 2014-09-23 ENCOUNTER — Ambulatory Visit (INDEPENDENT_AMBULATORY_CARE_PROVIDER_SITE_OTHER): Payer: Medicare Other | Admitting: Cardiovascular Disease

## 2014-09-23 VITALS — BP 149/74 | HR 61 | Ht 65.0 in | Wt 178.3 lb

## 2014-09-23 DIAGNOSIS — I779 Disorder of arteries and arterioles, unspecified: Secondary | ICD-10-CM

## 2014-09-23 DIAGNOSIS — I739 Peripheral vascular disease, unspecified: Principal | ICD-10-CM

## 2014-09-23 DIAGNOSIS — I10 Essential (primary) hypertension: Secondary | ICD-10-CM

## 2014-09-23 NOTE — Patient Instructions (Signed)
Your physician wants you to follow-up in: 1 year with Dr Berry. You will receive a reminder letter in the mail two months in advance. If you don't receive a letter, please call our office to schedule the follow-up appointment.  

## 2014-09-23 NOTE — Assessment & Plan Note (Signed)
Status post remote right carotid endarterectomy performed by Dr. Donnetta Hutching 11 or 12 years ago. We have been following this by duplex ultrasounds most recently performed 02/11/14 revealing a widely patent endarterectomy site unchanged from the prior study.

## 2014-09-23 NOTE — Progress Notes (Signed)
     09/23/2014 Colleen [name]   1935-01-06  [id number]  Primary Physician Garnet Koyanagi, DO Primary Cardiologist: Lorretta Harp MD [name]   HPI:  The patient is a 79 year old mildly to moderately overweight married Caucasian female, the mother of 2, grandmother to 5 grandchildren, whom I last saw 14 months ago. She has a history of PVOD, status post left carotid endarterectomy in the past preceded by a TIA approximately 10-12 years ago by Dr. Sherren Mocha Early. We have been following her Dopplers here in our office. Her most recent carotid Dopplers, performed November 01, 2011, revealed moderate right ICA stenosis with a widely patent left endarterectomy site. Other problems include hypertension and hyperlipidemia. She quit smoking approximately 20 years ago. She denies chest pain or shortness of breath. Echo and Myoview performed February 2011 were normal. Dr. Chalmers Cater follows her lipid profile. Since I last saw her 07/15/13 she has remained completely asymptomatic. She specifically denies chest pain or shortness of breath.     No Known Allergies  History   Social History  . Marital Status: Married    Spouse Name: N/A    Number of Children: N/A  . Years of Education: N/A   Occupational History  . Not on file.   Social History Main Topics  . Smoking status: Never Smoker   . Smokeless tobacco: Not on file  . Alcohol Use: Not on file  . Drug Use: Not on file  . Sexual Activity: Not on file   Other Topics Concern  . Not on file   Social History Narrative  . No narrative on file     Review of Systems: General: negative for chills, fever, night sweats or weight changes.  Cardiovascular: negative for chest pain, dyspnea on exertion, edema, orthopnea, palpitations, paroxysmal nocturnal dyspnea or shortness of breath Dermatological: negative for rash Respiratory: negative for cough or wheezing Urologic: negative for hematuria Abdominal: negative for nausea, vomiting,  diarrhea, bright red blood per rectum, melena, or hematemesis Neurologic: negative for visual changes, syncope, or dizziness All other systems reviewed and are otherwise negative except as noted above.    Blood pressure 149/74, pulse 61, height 5\' 5"  (1.651 m), weight 178 lb 4.8 oz (80.876 kg).  General appearance: alert and no distress Neck: no adenopathy, no carotid bruit, no JVD, supple, symmetrical, trachea midline and thyroid not enlarged, symmetric, no tenderness/mass/nodules Lungs: clear to auscultation bilaterally  EKG normal sinus rhythm at 61 with no ST or T wave changes  ASSESSMENT AND PLAN:   Carotid artery disease Status post remote right carotid endarterectomy performed by Dr. Donnetta Hutching 11 or 12 years ago. We have been following this by duplex ultrasounds most recently performed 02/11/14 revealing a widely patent endarterectomy site unchanged from the prior study.  Essential hypertension Controlled on current medications  HYPERLIPIDEMIA Followed by her endocrinologist Dr. Elvera Lennox MD [facility], [facility] 09/23/2014 12:55 PM

## 2014-09-23 NOTE — Assessment & Plan Note (Signed)
Controlled on current medications 

## 2014-09-23 NOTE — Assessment & Plan Note (Signed)
Followed by her endocrinologist Dr. Chalmers Cater

## 2014-10-14 ENCOUNTER — Ambulatory Visit: Payer: Medicare Other | Admitting: Cardiovascular Disease

## 2014-11-29 ENCOUNTER — Other Ambulatory Visit: Payer: Self-pay | Admitting: Cardiovascular Disease

## 2014-11-29 NOTE — Telephone Encounter (Signed)
Rx was sent to pharmacy electronically. 

## 2015-02-13 ENCOUNTER — Ambulatory Visit (HOSPITAL_COMMUNITY)
Admission: RE | Admit: 2015-02-13 | Discharge: 2015-02-13 | Disposition: A | Discharge status: Home / Self Care | Payer: Medicare Other | Source: Ambulatory Visit | Attending: Cardiovascular Disease | Admitting: Cardiovascular Disease

## 2015-02-13 DIAGNOSIS — I6521 Occlusion and stenosis of right carotid artery: Secondary | ICD-10-CM

## 2015-02-13 DIAGNOSIS — I779 Disorder of arteries and arterioles, unspecified: Secondary | ICD-10-CM

## 2015-02-13 DIAGNOSIS — I6529 Occlusion and stenosis of unspecified carotid artery: Secondary | ICD-10-CM

## 2015-02-13 NOTE — Progress Notes (Signed)
Carotid Duplex Completed. Colleen [name], BS, RDMS, RVT  

## 2015-02-15 ENCOUNTER — Ambulatory Visit (INDEPENDENT_AMBULATORY_CARE_PROVIDER_SITE_OTHER): Payer: Medicare Other | Admitting: Physician Assistant

## 2015-02-15 ENCOUNTER — Encounter: Payer: Self-pay | Admitting: Cardiovascular Disease

## 2015-02-15 ENCOUNTER — Encounter: Payer: Self-pay | Admitting: Physician Assistant

## 2015-02-15 VITALS — BP 138/88 | HR 58 | Ht 65.5 in | Wt 180.4 lb

## 2015-02-15 DIAGNOSIS — I209 Angina pectoris, unspecified: Secondary | ICD-10-CM | POA: Diagnosis not present

## 2015-02-15 DIAGNOSIS — I10 Essential (primary) hypertension: Secondary | ICD-10-CM

## 2015-02-15 DIAGNOSIS — E785 Hyperlipidemia, unspecified: Secondary | ICD-10-CM

## 2015-02-15 DIAGNOSIS — R079 Chest pain, unspecified: Secondary | ICD-10-CM | POA: Insufficient documentation

## 2015-02-15 DIAGNOSIS — R0789 Other chest pain: Secondary | ICD-10-CM | POA: Diagnosis not present

## 2015-02-15 DIAGNOSIS — I779 Disorder of arteries and arterioles, unspecified: Secondary | ICD-10-CM

## 2015-02-15 DIAGNOSIS — I739 Peripheral vascular disease, unspecified: Secondary | ICD-10-CM

## 2015-02-15 NOTE — Assessment & Plan Note (Signed)
Followed by Dr. Balan. 

## 2015-02-15 NOTE — Assessment & Plan Note (Signed)
Monitoring Dopplers were just completed on the 22nd. Review of the study pending

## 2015-02-15 NOTE — Patient Instructions (Signed)
Your physician recommends that you schedule a follow-up appointment in: after you have completed your stress test  We have ordered a stress test for you to get done

## 2015-02-15 NOTE — Assessment & Plan Note (Signed)
Blood pressure is mildly elevated. We'll continue to monitor and review at next office visit

## 2015-02-15 NOTE — Progress Notes (Signed)
Patient ID: Colleen [name], female   DOB: 05/11/1935, 80 y.o.   MRN: [id number]    Date:  02/15/2015   ID:  Colleen [name], DOB 05-09-1935, MRN [id number]  PCP:  Garnet Koyanagi, DO  Primary Cardiologist:  Gwenlyn Found   Chief Complaint  Patient presents with  . Chest Pain     History of Present Illness: Colleen [name] is a 80 y.o. female  overweight married Caucasian female, the mother of 2, grandmother to 5 grandchildren, whom I last saw 14 months ago. She has a history of PVOD, status post left carotid endarterectomy in the past preceded by a TIA approximately 10-12 years ago by Dr. Sherren Mocha Early. We have been following her Dopplers here in our office. Her most recent carotid Dopplers, performed November 01, 2011, revealed moderate right ICA stenosis with a widely patent left endarterectomy site. Other problems include hypertension and hyperlipidemia. She quit smoking approximately 20 years ago. She denies chest pain or shortness of breath. Echo and Myoview performed February 2011 were normal. Dr. Chalmers Cater follows her lipid profile.   Patient presented today with complaints of chest tightness, short of breath and fatigue for the last 1-1-1/2 weeks. She first noticed it all she was at the store carrying a bunch groceries and hurrying to get through the store. She's also noticed it while working in the kitchen and also occasionally while sitting eating dinner. She says if she takes a deep breath it seems to help. She denies any nausea, vomiting, radiation, diaphoresis.  The patient currently denies fever, orthopnea, dizziness, PND, cough, congestion, abdominal pain, hematochezia, melena, lower extremity edema, claudication.  Wt Readings from Last 3 Encounters:  02/15/15 180 lb 6.4 oz (81.829 kg)  09/23/14 178 lb 4.8 oz (80.876 kg)  04/18/14 176 lb 3.2 oz (79.924 kg)     Past Medical History  Diagnosis Date  . Hyperlipidemia   . Hypertension   . Hypothyroidism   . TIA (transient ischemic attack) 2001  .  Carotid artery disease     Current Outpatient Prescriptions  Medication Sig Dispense Refill  . atenolol (TENORMIN) 50 MG tablet Take 50 mg by mouth daily.      . Calcium Carbonate (GNP CALCIUM) 1500 MG TABS Take by mouth.      . Ergocalciferol (VITAMIN D2) 2000 UNITS TABS Take by mouth.      . levothyroxine (SYNTHROID, LEVOTHROID) 100 MCG tablet Take 100 mcg by mouth daily.      Marland Kitchen lisinopril-hydrochlorothiazide (PRINZIDE,ZESTORETIC) 20-12.5 MG per tablet Take 1 tablet by mouth daily. 30 tablet 10  . magnesium 30 MG tablet Take 30 mg by mouth 2 (two) times daily.      . niacin 500 MG tablet Take 500 mg by mouth daily with breakfast. otc    . Potassium 99 MG TABS Take 1 tablet by mouth daily. otc    . vitamin B-12 (CYANOCOBALAMIN) 500 MCG tablet Take 500 mcg by mouth daily.     No current facility-administered medications for this visit.    Allergies:   No Known Allergies  Social History:  The patient  reports that she has never smoked. She does not have any smokeless tobacco history on file.   Family history:  No family history on file.  ROS:  Please see the history of present illness.  All other systems reviewed and negative.   PHYSICAL EXAM: VS:  BP 138/88 mmHg  Pulse 58  Ht 5' 5.5" (1.664 m)  Wt 180 lb 6.4 oz (81.829  kg)  BMI 29.55 kg/m2 Overweight well developed, in no acute distress HEENT: Pupils are equal round react to light accommodation extraocular movements are intact.  Neck: no JVDNo cervical lymphadenopathy. Cardiac: Regular irregular without murmurs rubs or gallops. Lungs:  clear to auscultation bilaterally, no wheezing, rhonchi or rales Abd: soft, nontender, positive bowel sounds all quadrants, no hepatosplenomegaly Ext: Trace lower extremity edema.  2+ radial and dorsalis pedis pulses. Skin: warm and dry Neuro:  Grossly normal  EKG:  Sinus rhythm with regular PACs first-degree AV block   ASSESSMENT AND PLAN:  Problem List Items Addressed This Visit     Hyperlipidemia    Followed by Dr. Chalmers Cater      Essential hypertension    Blood pressure is mildly elevated. We'll continue to monitor and review at next office visit      Chest pain    Certainly concerning for angina. We'll schedule Lexiscan stress test.      Relevant Orders   EKG 12-Lead   Myocardial Perfusion Imaging   Carotid artery disease    Monitoring Dopplers were just completed on the 22nd. Review of the study pending       Other Visit Diagnoses    Other chest pain    -  Primary    Relevant Orders    EKG 12-Lead    Myocardial Perfusion Imaging

## 2015-02-15 NOTE — Assessment & Plan Note (Signed)
Certainly concerning for angina. We'll schedule Lexiscan stress test.

## 2015-02-20 ENCOUNTER — Encounter (HOSPITAL_COMMUNITY): Payer: Self-pay | Admitting: Emergency Medicine

## 2015-02-20 ENCOUNTER — Observation Stay (HOSPITAL_COMMUNITY)
Admission: EM | Admit: 2015-02-20 | Discharge: 2015-02-21 | Disposition: A | Discharge status: Home / Self Care | Payer: Medicare Other | Attending: Cardiovascular Disease | Admitting: Cardiovascular Disease

## 2015-02-20 ENCOUNTER — Emergency Department (HOSPITAL_COMMUNITY): Payer: Medicare Other

## 2015-02-20 DIAGNOSIS — I779 Disorder of arteries and arterioles, unspecified: Secondary | ICD-10-CM | POA: Insufficient documentation

## 2015-02-20 DIAGNOSIS — Z008 Encounter for other general examination: Secondary | ICD-10-CM

## 2015-02-20 DIAGNOSIS — I209 Angina pectoris, unspecified: Secondary | ICD-10-CM

## 2015-02-20 DIAGNOSIS — E785 Hyperlipidemia, unspecified: Secondary | ICD-10-CM | POA: Diagnosis not present

## 2015-02-20 DIAGNOSIS — R002 Palpitations: Secondary | ICD-10-CM

## 2015-02-20 DIAGNOSIS — G459 Transient cerebral ischemic attack, unspecified: Secondary | ICD-10-CM | POA: Diagnosis not present

## 2015-02-20 DIAGNOSIS — R079 Chest pain, unspecified: Principal | ICD-10-CM | POA: Insufficient documentation

## 2015-02-20 DIAGNOSIS — E039 Hypothyroidism, unspecified: Secondary | ICD-10-CM | POA: Diagnosis not present

## 2015-02-20 DIAGNOSIS — I10 Essential (primary) hypertension: Secondary | ICD-10-CM | POA: Insufficient documentation

## 2015-02-20 DIAGNOSIS — I739 Peripheral vascular disease, unspecified: Secondary | ICD-10-CM

## 2015-02-20 DIAGNOSIS — E038 Other specified hypothyroidism: Secondary | ICD-10-CM

## 2015-02-20 LAB — BASIC METABOLIC PANEL
Anion gap: 8 (ref 5–15)
BUN: 11 mg/dL (ref 6–23)
CALCIUM: 9.4 mg/dL (ref 8.4–10.5)
CO2: 26 mmol/L (ref 19–32)
Chloride: 108 mmol/L (ref 96–112)
Creatinine, Ser: 0.65 mg/dL (ref 0.50–1.10)
GFR calc Af Amer: >90 mL/min (ref >90)
GFR, EST NON AFRICAN AMERICAN: 83 mL/min — AB (ref >90)
GLUCOSE: 118 mg/dL — AB (ref 70–99)
Potassium: 3.6 mmol/L (ref 3.5–5.1)
SODIUM: 142 mmol/L (ref 135–145)

## 2015-02-20 LAB — CBC WITH DIFFERENTIAL/PLATELET
BASOS ABS: 0 10*3/uL (ref 0.0–0.1)
BASOS PCT: 0 % (ref 0–1)
EOS PCT: 4 % (ref 0–5)
Eosinophils Absolute: 0.2 10*3/uL (ref 0.0–0.7)
HCT: 41.3 % (ref 36.0–46.0)
Hemoglobin: 13.6 g/dL (ref 12.0–15.0)
LYMPHS PCT: 28 % (ref 12–46)
Lymphs Abs: 1.5 10*3/uL (ref 0.7–4.0)
MCH: 32.9 pg (ref 26.0–34.0)
MCHC: 32.9 g/dL (ref 30.0–36.0)
MCV: 100 fL (ref 78.0–100.0)
MONO ABS: 0.5 10*3/uL (ref 0.1–1.0)
Monocytes Relative: 9 % (ref 3–12)
Neutro Abs: 3.1 10*3/uL (ref 1.7–7.7)
Neutrophils Relative %: 59 % (ref 43–77)
PLATELETS: 200 10*3/uL (ref 150–400)
RBC: 4.13 MIL/uL (ref 3.87–5.11)
RDW: 12.7 % (ref 11.5–15.5)
WBC: 5.3 10*3/uL (ref 4.0–10.5)

## 2015-02-20 LAB — I-STAT TROPONIN, ED: Troponin i, poc: 0.01 ng/mL (ref 0.00–0.08)

## 2015-02-20 LAB — TROPONIN I

## 2015-02-20 LAB — TSH: TSH: 0.814 u[IU]/mL (ref 0.350–4.500)

## 2015-02-20 MED ORDER — LISINOPRIL 20 MG PO TABS
20.0000 mg | ORAL_TABLET | Freq: Every day | ORAL | Status: DC
  Administered 2015-02-20: 20 mg via ORAL
  Filled 2015-02-20: qty 1

## 2015-02-20 MED ORDER — LISINOPRIL-HYDROCHLOROTHIAZIDE 20-12.5 MG PO TABS: 1.0000 | ORAL_TABLET | Freq: Every day | ORAL | Status: DC

## 2015-02-20 MED ORDER — ACETAMINOPHEN 325 MG PO TABS: 650.0000 mg | ORAL_TABLET | ORAL | Status: DC | PRN

## 2015-02-20 MED ORDER — ASPIRIN 81 MG PO CHEW
324.0000 mg | CHEWABLE_TABLET | Freq: Once | ORAL | Status: AC
  Administered 2015-02-20: 324 mg via ORAL
  Filled 2015-02-20: qty 4

## 2015-02-20 MED ORDER — ATORVASTATIN CALCIUM 10 MG PO TABS
10.0000 mg | ORAL_TABLET | Freq: Every day | ORAL | Status: DC
  Administered 2015-02-20: 10 mg via ORAL
  Filled 2015-02-20 (×2): qty 1

## 2015-02-20 MED ORDER — NITROGLYCERIN 0.4 MG SL SUBL: 0.4000 mg | SUBLINGUAL_TABLET | SUBLINGUAL | Status: DC | PRN

## 2015-02-20 MED ORDER — LEVOTHYROXINE SODIUM 100 MCG PO TABS
100.0000 ug | ORAL_TABLET | Freq: Every day | ORAL | Status: DC
  Administered 2015-02-21: 100 ug via ORAL
  Filled 2015-02-20: qty 1

## 2015-02-20 MED ORDER — HYDROCHLOROTHIAZIDE 12.5 MG PO CAPS
12.5000 mg | ORAL_CAPSULE | Freq: Every day | ORAL | Status: DC
  Administered 2015-02-20: 12.5 mg via ORAL
  Filled 2015-02-20: qty 1

## 2015-02-20 MED ORDER — ONDANSETRON HCL 4 MG/2ML IJ SOLN: 4.0000 mg | Freq: Four times a day (QID) | INTRAMUSCULAR | Status: DC | PRN

## 2015-02-20 MED ORDER — HYDROCHLOROTHIAZIDE 12.5 MG PO CAPS: 12.5000 mg | ORAL_CAPSULE | Freq: Every day | ORAL | Status: DC

## 2015-02-20 MED ORDER — ASPIRIN EC 81 MG PO TBEC
81.0000 mg | DELAYED_RELEASE_TABLET | Freq: Every day | ORAL | Status: DC
  Administered 2015-02-21: 81 mg via ORAL
  Filled 2015-02-20: qty 1

## 2015-02-20 MED ORDER — ATENOLOL 25 MG PO TABS
50.0000 mg | ORAL_TABLET | Freq: Every day | ORAL | Status: DC
  Filled 2015-02-20: qty 1

## 2015-02-20 MED ORDER — LEVOTHYROXINE SODIUM 100 MCG PO TABS
100.0000 ug | ORAL_TABLET | Freq: Every day | ORAL | Status: DC
  Administered 2015-02-20: 100 ug via ORAL
  Filled 2015-02-20: qty 1

## 2015-02-20 MED ORDER — ATENOLOL 25 MG PO TABS
50.0000 mg | ORAL_TABLET | Freq: Every day | ORAL | Status: DC
  Administered 2015-02-20: 50 mg via ORAL
  Filled 2015-02-20: qty 2

## 2015-02-20 MED ORDER — LISINOPRIL 20 MG PO TABS: 20.0000 mg | ORAL_TABLET | Freq: Every day | ORAL | Status: DC

## 2015-02-20 NOTE — Plan of Care (Signed)
Problem: Phase II Progression Outcomes Goal: Stress Test if indicated Outcome: Progressing Scheduled for tomorrow AM.

## 2015-02-20 NOTE — ED Notes (Signed)
Pt states she woke up @ 0415 with midsternal chest tightness, return hx of heartburn. Pt states she felt her heart was working hard. Pt due to have stress test 3/8.

## 2015-02-20 NOTE — H&P (Signed)
Colleen [name] is an 80 y.o. female.   Chief Complaint:  CP HPI:   Colleen [name] is a 80 y.o. female overweight married Caucasian female, the mother of 2, grandmother to 5 grandchildren, whom I last saw 14 months ago. She has a history of PVOD, status post left carotid endarterectomy in the past preceded by a TIA approximately 10-12 years ago by Dr. Sherren Mocha Early. We have been following her Dopplers here in our office. Her most recent carotid Dopplers, performed November 01, 2011, revealed moderate right ICA stenosis with a widely patent left endarterectomy site. Other problems include hypertension and hyperlipidemia. She quit smoking approximately 20 years ago. Echo and Myoview performed February 2011 were normal. Dr. Chalmers Cater follows her lipid profile.   I saw the patient on 02/15/15 at which time she was complaining of chest tightness, shortness of breath and fatigue for 1-1-1/2 weeks. She first noticed it all she was at the store carrying a bunch groceries and hurrying to get through the store. She's also noticed it while working in the kitchen and also occasionally while sitting eating dinner. She says if she takes a deep breath it seems to help.  I ordered a Lexiscan which has not been completed yet.  She present's to the ER With chest tightness which started this morning around 0430 hrs.  She also reports a lot of gas and belching.  She says her heart was also pounding, BP was over 200 and HR irregular.  BP in the ER 193/86 at 0500hrs.  Still with some chest tightness in the ER. She denies nausea, vomiting, fever, shortness of breath, orthopnea, dizziness, PND, cough, congestion, abdominal pain, hematochezia, melena, lower extremity edema, claudication.     Past Medical History  Diagnosis Date  . Hyperlipidemia   . Hypertension   . Hypothyroidism   . TIA (transient ischemic attack) 2001  . Carotid artery disease     Past Surgical History  Procedure Laterality Date  . Cholecystectomy      . Abdominal hysterectomy  1975    TAH,BSO  . Carotid endarterectomy  2001    left    No family history on file. Social History:  reports that she has quit smoking. She does not have any smokeless tobacco history on file. She reports that she drinks alcohol. She reports that she does not use illicit drugs.  Allergies: No Known Allergies   (Not in a hospital admission)  Results for orders placed or performed during the hospital encounter of 02/20/15 (from the past 48 hour(s))  CBC with Differential/Platelet     Status: None   Collection Time: 02/20/15  6:31 AM  Result Value Ref Range   WBC 5.3 4.0 - 10.5 K/uL   RBC 4.13 3.87 - 5.11 MIL/uL   Hemoglobin 13.6 12.0 - 15.0 g/dL   HCT 41.3 36.0 - 46.0 %   MCV 100.0 78.0 - 100.0 fL   MCH 32.9 26.0 - 34.0 pg   MCHC 32.9 30.0 - 36.0 g/dL   RDW 12.7 11.5 - 15.5 %   Platelets 200 150 - 400 K/uL   Neutrophils Relative % 59 43 - 77 %   Neutro Abs 3.1 1.7 - 7.7 K/uL   Lymphocytes Relative 28 12 - 46 %   Lymphs Abs 1.5 0.7 - 4.0 K/uL   Monocytes Relative 9 3 - 12 %   Monocytes Absolute 0.5 0.1 - 1.0 K/uL   Eosinophils Relative 4 0 - 5 %  Eosinophils Absolute 0.2 0.0 - 0.7 K/uL   Basophils Relative 0 0 - 1 %   Basophils Absolute 0.0 0.0 - 0.1 K/uL  Basic metabolic panel     Status: Abnormal   Collection Time: 02/20/15  6:31 AM  Result Value Ref Range   Sodium 142 135 - 145 mmol/L   Potassium 3.6 3.5 - 5.1 mmol/L   Chloride 108 96 - 112 mmol/L   CO2 26 19 - 32 mmol/L   Glucose, Bld 118 (H) 70 - 99 mg/dL   BUN 11 6 - 23 mg/dL   Creatinine, Ser 0.65 0.50 - 1.10 mg/dL   Calcium 9.4 8.4 - 10.5 mg/dL   GFR calc non Af Amer 83 (L) >90 mL/min   GFR calc Af Amer >90 >90 mL/min    Comment: (NOTE) The eGFR has been calculated using the CKD EPI equation. This calculation has not been validated in all clinical situations. eGFR's persistently <90 mL/min signify possible Chronic Kidney Disease.    Anion gap 8 5 - 15  I-Stat Troponin, ED  (not at [facility])     Status: None   Collection Time: 02/20/15  6:53 AM  Result Value Ref Range   Troponin i, poc 0.01 0.00 - 0.08 ng/mL   Comment 3            Comment: Due to the release kinetics of cTnI, a negative result within the first hours of the onset of symptoms does not rule out myocardial infarction with certainty. If myocardial infarction is still suspected, repeat the test at appropriate intervals.    Dg Chest 2 View  02/20/2015   CLINICAL DATA:  New onset chest tightness and elevated blood pressure.  EXAM: CHEST  2 VIEW  COMPARISON:  None.  FINDINGS: There is mild pulmonary hyperinflation and interstitial coarsening, possible COPD. There is no edema, consolidation, effusion, or pneumothorax. Borderline cardiomegaly; aortic and hilar contours are negative. Noted eventration of the right diaphragm.  Cholecystectomy and left neck surgery.  IMPRESSION: No active cardiopulmonary disease.   Electronically Signed   By: Monte Fantasia M.D.   On: 02/20/2015 06:34    Review of Systems  Constitutional: Negative for fever and diaphoresis.  HENT: Negative for congestion and sore throat.   Respiratory: Negative for cough and shortness of breath.   Cardiovascular: Positive for chest pain and palpitations. Negative for leg swelling and PND.  Gastrointestinal: Negative for nausea, vomiting, abdominal pain, blood in stool and melena.  Musculoskeletal: Negative for myalgias.  Neurological: Positive for dizziness (A little last night).  All other systems reviewed and are negative.   Blood pressure 131/59, pulse 45, temperature 98.6 F (37 C), temperature source Oral, resp. rate 16, height _0  (1.651 m), weight 178 lb (80.74 kg), SpO2 94 %. Physical Exam  Nursing note and vitals reviewed. Constitutional: She is oriented to person, place, and time. She appears well-developed and well-nourished. No distress.  HENT:  Head: Normocephalic and atraumatic.  Eyes: EOM are normal. Pupils are equal,  round, and reactive to light.  Neck: Normal range of motion. Neck supple. No JVD present.  Cardiovascular: Normal rate, regular rhythm, S1 normal and S2 normal.   No murmur heard. Pulses:      Radial pulses are 2+ on the right side, and 2+ on the left side.       Dorsalis pedis pulses are 2+ on the right side, and 2+ on the left side.  No carotid Bruit  Respiratory: Effort normal and breath sounds  normal. She has no wheezes. She has no rales.  GI: Soft. Bowel sounds are normal. She exhibits no distension. There is no tenderness.  Musculoskeletal: She exhibits no edema.  Lymphadenopathy:    She has no cervical adenopathy.  Neurological: She is alert and oriented to person, place, and time. She exhibits normal muscle tone.  Skin: Skin is warm and dry.  Psychiatric: She has a normal mood and affect.     Assessment/Plan Principal Problem:   Chest pain Active Problems:   Hypothyroidism   Hyperlipidemia   Essential hypertension  Plan: Will admit to tele at [facility] and do a lexiscan tomorrow.  Cycle troponin which was negative x1.  No ischemic EKG changes.  This could be related to HTN and GI symptoms.  The symptoms a few weeks ago appeared exertional.  Continue home meds and monitor BP.  Nothing acute on CXR.  ASA, BB, liptior.  TSH and lipid panel  Lydie Stammen, PA-C 02/20/2015, 11:04 AM

## 2015-02-20 NOTE — ED Notes (Signed)
Report called to [facility] [address] [facility], [place] contacted

## 2015-02-20 NOTE — ED Provider Notes (Signed)
CSN: [id number]     Arrival date & time 02/20/15  0532 History   First MD Initiated Contact with Patient 02/20/15 0559     Chief Complaint  Patient presents with  . Palpitations     (Consider location/radiation/quality/duration/timing/severity/associated sxs/prior Treatment) HPI Comments: Patient presents to the ED with a chief complaint of heart palpitations.  She states that the symptoms started this morning at 0415.  States symptoms awoke her from sleep.  She reports feeling associated chest tightness, which has since resolved.  States that she had heartburn yesterday.  She states that she took a lisinopril this morning.  She denies any associated SOB, diaphoresis, or radiating symptoms.  She was just seen by Dr. Gwenlyn Found in cardiology clinic.  Scheduled for a stress test next week.  The history is provided by the patient. No language interpreter was used.    Past Medical History  Diagnosis Date  . Hyperlipidemia   . Hypertension   . Hypothyroidism   . TIA (transient ischemic attack) 2001  . Carotid artery disease    Past Surgical History  Procedure Laterality Date  . Cholecystectomy    . Abdominal hysterectomy  1975    TAH,BSO  . Carotid endarterectomy  2001    left   No family history on file. History  Substance Use Topics  . Smoking status: Former Research scientist (life sciences)  . Smokeless tobacco: Not on file  . Alcohol Use: Yes   OB History    No data available     Review of Systems  Constitutional: Negative for fever and chills.  Respiratory: Positive for chest tightness. Negative for shortness of breath.   Cardiovascular: Positive for chest pain and palpitations.  Gastrointestinal: Negative for nausea, vomiting, diarrhea and constipation.  Genitourinary: Negative for dysuria.  All other systems reviewed and are negative.     Allergies  Review of patient's allergies indicates no known allergies.  Home Medications   Prior to Admission medications   Medication Sig Start Date  End Date Taking? Authorizing Provider  atenolol (TENORMIN) 50 MG tablet Take 50 mg by mouth daily.      Historical Provider, MD  Calcium Carbonate (GNP CALCIUM) 1500 MG TABS Take by mouth.      Historical Provider, MD  Ergocalciferol (VITAMIN D2) 2000 UNITS TABS Take by mouth.      Historical Provider, MD  levothyroxine (SYNTHROID, LEVOTHROID) 100 MCG tablet Take 100 mcg by mouth daily.      Historical Provider, MD  lisinopril-hydrochlorothiazide (PRINZIDE,ZESTORETIC) 20-12.5 MG per tablet Take 1 tablet by mouth daily. 11/29/14   Lorretta Harp, MD  magnesium 30 MG tablet Take 30 mg by mouth 2 (two) times daily.      Historical Provider, MD  niacin 500 MG tablet Take 500 mg by mouth daily with breakfast. otc    Historical Provider, MD  Potassium 99 MG TABS Take 1 tablet by mouth daily. otc    Historical Provider, MD  vitamin B-12 (CYANOCOBALAMIN) 500 MCG tablet Take 500 mcg by mouth daily.    Historical Provider, MD   BP 193/86 mmHg  Pulse 63  Temp(Src) 97.8 F (36.6 C) (Oral)  Resp 18  Ht 5\' 5"  (1.651 m)  Wt 178 lb (80.74 kg)  BMI 29.62 kg/m2  SpO2 96% Physical Exam  Constitutional: She is oriented to person, place, and time. She appears well-developed and well-nourished.  HENT:  Head: Normocephalic and atraumatic.  Eyes: Conjunctivae and EOM are normal. Pupils are equal, round, and reactive to light.  Neck: Normal range of motion. Neck supple.  Cardiovascular: Normal rate and regular rhythm.  Exam reveals no gallop and no friction rub.   No murmur heard. Pulmonary/Chest: Effort normal and breath sounds normal. No respiratory distress. She has no wheezes. She has no rales. She exhibits no tenderness.  Abdominal: Soft. Bowel sounds are normal. She exhibits no distension and no mass. There is no tenderness. There is no rebound and no guarding.  Musculoskeletal: Normal range of motion. She exhibits no edema or tenderness.  Neurological: She is alert and oriented to person, place, and  time.  Skin: Skin is warm and dry.  Psychiatric: She has a normal mood and affect. Her behavior is normal. Judgment and thought content normal.  Nursing note and vitals reviewed.   ED Course  Procedures (including critical care time) Results for orders placed or performed during the hospital encounter of 02/20/15  CBC with Differential/Platelet  Result Value Ref Range   WBC 5.3 4.0 - 10.5 K/uL   RBC 4.13 3.87 - 5.11 MIL/uL   Hemoglobin 13.6 12.0 - 15.0 g/dL   HCT 41.3 36.0 - 46.0 %   MCV 100.0 78.0 - 100.0 fL   MCH 32.9 26.0 - 34.0 pg   MCHC 32.9 30.0 - 36.0 g/dL   RDW 12.7 11.5 - 15.5 %   Platelets 200 150 - 400 K/uL   Neutrophils Relative % 59 43 - 77 %   Neutro Abs 3.1 1.7 - 7.7 K/uL   Lymphocytes Relative 28 12 - 46 %   Lymphs Abs 1.5 0.7 - 4.0 K/uL   Monocytes Relative 9 3 - 12 %   Monocytes Absolute 0.5 0.1 - 1.0 K/uL   Eosinophils Relative 4 0 - 5 %   Eosinophils Absolute 0.2 0.0 - 0.7 K/uL   Basophils Relative 0 0 - 1 %   Basophils Absolute 0.0 0.0 - 0.1 K/uL  Basic metabolic panel  Result Value Ref Range   Sodium 142 135 - 145 mmol/L   Potassium 3.6 3.5 - 5.1 mmol/L   Chloride 108 96 - 112 mmol/L   CO2 26 19 - 32 mmol/L   Glucose, Bld 118 (H) 70 - 99 mg/dL   BUN 11 6 - 23 mg/dL   Creatinine, Ser 0.65 0.50 - 1.10 mg/dL   Calcium 9.4 8.4 - 10.5 mg/dL   GFR calc non Af Amer 83 (L) >90 mL/min   GFR calc Af Amer >90 >90 mL/min   Anion gap 8 5 - 15  I-Stat Troponin, ED (not at [facility])  Result Value Ref Range   Troponin i, poc 0.01 0.00 - 0.08 ng/mL   Comment 3           Dg Chest 2 View  02/20/2015   CLINICAL DATA:  New onset chest tightness and elevated blood pressure.  EXAM: CHEST  2 VIEW  COMPARISON:  None.  FINDINGS: There is mild pulmonary hyperinflation and interstitial coarsening, possible COPD. There is no edema, consolidation, effusion, or pneumothorax. Borderline cardiomegaly; aortic and hilar contours are negative. Noted eventration of the right diaphragm.   Cholecystectomy and left neck surgery.  IMPRESSION: No active cardiopulmonary disease.   Electronically Signed   By: Monte Fantasia M.D.   On: 02/20/2015 06:34      EKG Interpretation   Date/Time:  Monday February 20 2015 05:44:11 EST Ventricular Rate:  57 PR Interval:  202 QRS Duration: 94 QT Interval:  416 QTC Calculation: 405 R Axis:   -44 Text Interpretation:  Sinus rhythm Atrial premature complexes Left  anterior fascicular block unchanged from prior Reconfirmed by OTTER  MD,  OLGA ([id number]) on 02/20/2015 5:57:34 AM      MDM   Final diagnoses:  Encounter for medical assessment  Palpitations    Patient with hx of HTN, HL, CAD seen today for chest pain and palpitations that awoke her from sleep.  States that she is feeling better now.  States that she was not feeling well last week and was seen by cardiology.  Plans on having stress test next week.  Will check labs.  Patient seen by and discussed with Dr. Sharol Given, who recommends cardiology consultation and probable admission.  Patient to be admitted by cardiology and transferred to [facility].    Montine Circle, PA-C 02/20/15 [place], MD 02/21/15 [phone]

## 2015-02-21 ENCOUNTER — Observation Stay (HOSPITAL_COMMUNITY): Payer: Medicare Other

## 2015-02-21 ENCOUNTER — Inpatient Hospital Stay (HOSPITAL_COMMUNITY): Admit: 2015-02-21 | Payer: Medicare Other

## 2015-02-21 ENCOUNTER — Other Ambulatory Visit (HOSPITAL_COMMUNITY): Payer: Medicare Other

## 2015-02-21 DIAGNOSIS — I10 Essential (primary) hypertension: Secondary | ICD-10-CM | POA: Diagnosis not present

## 2015-02-21 DIAGNOSIS — R079 Chest pain, unspecified: Secondary | ICD-10-CM

## 2015-02-21 DIAGNOSIS — E785 Hyperlipidemia, unspecified: Secondary | ICD-10-CM | POA: Diagnosis not present

## 2015-02-21 DIAGNOSIS — E039 Hypothyroidism, unspecified: Secondary | ICD-10-CM | POA: Diagnosis not present

## 2015-02-21 LAB — BASIC METABOLIC PANEL
ANION GAP: 10 (ref 5–15)
BUN: 13 mg/dL (ref 6–23)
CO2: 29 mmol/L (ref 19–32)
CREATININE: 0.86 mg/dL (ref 0.50–1.10)
Calcium: 9.2 mg/dL (ref 8.4–10.5)
Chloride: 102 mmol/L (ref 96–112)
GFR calc Af Amer: 74 mL/min — ABNORMAL LOW (ref >90)
GFR calc non Af Amer: 64 mL/min — ABNORMAL LOW (ref >90)
Glucose, Bld: 104 mg/dL — ABNORMAL HIGH (ref 70–99)
POTASSIUM: 3.6 mmol/L (ref 3.5–5.1)
Sodium: 141 mmol/L (ref 135–145)

## 2015-02-21 LAB — LIPID PANEL
Cholesterol: 148 mg/dL (ref 0–200)
HDL: 47 mg/dL (ref >39)
LDL Cholesterol: 83 mg/dL (ref 0–99)
Total CHOL/HDL Ratio: 3.1 RATIO
Triglycerides: 90 mg/dL (ref <150)
VLDL: 18 mg/dL (ref 0–40)

## 2015-02-21 LAB — TROPONIN I: Troponin I: <0.03 ng/mL (ref <0.031)

## 2015-02-21 MED ORDER — TECHNETIUM TC 99M SESTAMIBI GENERIC - CARDIOLITE
30.0000 | Freq: Once | INTRAVENOUS | Status: AC | PRN
  Administered 2015-02-21: 30 via INTRAVENOUS

## 2015-02-21 MED ORDER — TECHNETIUM TC 99M SESTAMIBI GENERIC - CARDIOLITE
10.0000 | Freq: Once | INTRAVENOUS | Status: AC | PRN
  Administered 2015-02-21: 10 via INTRAVENOUS

## 2015-02-21 MED ORDER — LISINOPRIL 20 MG PO TABS: 30.0000 mg | ORAL_TABLET | Freq: Every day | ORAL | Status: DC

## 2015-02-21 MED ORDER — LISINOPRIL 30 MG PO TABS: 30.0000 mg | ORAL_TABLET | Freq: Every day | ORAL | Status: DC

## 2015-02-21 MED ORDER — HYDROCHLOROTHIAZIDE 12.5 MG PO CAPS: 12.5000 mg | ORAL_CAPSULE | Freq: Every day | ORAL | Status: DC

## 2015-02-21 MED ORDER — REGADENOSON 0.4 MG/5ML IV SOLN
INTRAVENOUS | Status: AC
  Administered 2015-02-21: 0.4 mg
  Filled 2015-02-21: qty 5

## 2015-02-21 NOTE — Progress Notes (Signed)
UR completed 

## 2015-02-21 NOTE — Progress Notes (Signed)
    Subjective: CP free.  Objective: Vital signs in last 24 hours: Temp:  [98.2 F (36.8 C)-98.6 F (37 C)] 98.5 F (36.9 C) (03/01 0500) Pulse Rate:  [42-59] 42 (03/01 0500) Resp:  [9-19] 18 (03/01 0500) BP: (119-190)/(57-99) 119/62 mmHg (03/01 0919) SpO2:  [91 %-96 %] 96 % (03/01 0500) Weight:  [177 lb 1.6 oz (80.332 kg)-179 lb 9.6 oz (81.466 kg)] 177 lb 1.6 oz (80.332 kg) (03/01 0500)    Intake/Output from previous day:   Intake/Output this shift:    Medications Current Facility-Administered Medications  Medication Dose Route Frequency Provider Last Rate Last Dose  . acetaminophen (TYLENOL) tablet 650 mg  650 mg Oral Q4H PRN Brett Canales, PA-C      . aspirin EC tablet 81 mg  81 mg Oral Daily Brett Canales, PA-C      . atenolol (TENORMIN) tablet 50 mg  50 mg Oral Daily Lorretta Harp, MD   50 mg at 02/20/15 2205  . atorvastatin (LIPITOR) tablet 10 mg  10 mg Oral q1800 Brett Canales, PA-C   10 mg at 02/20/15 1928  . lisinopril (PRINIVIL,ZESTRIL) tablet 20 mg  20 mg Oral Daily Lorretta Harp, MD   20 mg at 02/20/15 2205   And  . hydrochlorothiazide (MICROZIDE) capsule 12.5 mg  12.5 mg Oral QHS Lorretta Harp, MD   12.5 mg at 02/20/15 2205  . levothyroxine (SYNTHROID, LEVOTHROID) tablet 100 mcg  100 mcg Oral QAC breakfast Lorretta Harp, MD   100 mcg at 02/21/15 0609  . nitroGLYCERIN (NITROSTAT) SL tablet 0.4 mg  0.4 mg Sublingual Q5 Min x 3 PRN Brett Canales, PA-C      . ondansetron [facility]) injection 4 mg  4 mg Intravenous Q6H PRN Brett Canales, PA-C        PE: General appearance: alert, cooperative and no distress Lungs: clear to auscultation bilaterally Heart: reg rhythm.  rate slow.  no MM Extremities: No LEE Pulses: 2+ and symmetric Skin: Warm and dry Neurologic: Grossly normal  Lab Results:   Recent Labs  02/20/15 0631  WBC 5.3  HGB 13.6  HCT 41.3  PLT 200   BMET  Recent Labs  02/20/15 0631 02/21/15 0335  NA 142 141  K 3.6 3.6  CL 108  102  CO2 26 29  GLUCOSE 118* 104*  BUN 11 13  CREATININE 0.65 0.86  CALCIUM 9.4 9.2   PT/INR No results for input(s): LABPROT, INR in the last 72 hours. Cholesterol  Recent Labs  02/21/15 0335  CHOL 148   Lipid Panel     Component Value Date/Time   CHOL 148 02/21/2015 0335   TRIG 90 02/21/2015 0335   HDL 47 02/21/2015 0335   CHOLHDL 3.1 02/21/2015 0335   VLDL 18 02/21/2015 0335   LDLCALC 83 02/21/2015 0335     Assessment/Plan    Principal Problem:   Chest pain Active Problems:   Hypothyroidism   Hyperlipidemia   Essential hypertension    Plan:   Troponin negative x1.  Serial troponins were not drawn??.   She tolerated the lexiscan well.  DC home this afternoon if negative.  Increase lisinopril for better bp controll.  Lipids look good   Neev Mcmains PA-C 02/21/2015 9:37 AM

## 2015-02-21 NOTE — Discharge Summary (Addendum)
Physician Discharge Summary     Cardiologist Patient ID: Colleen [name] MRN: [id number] DOB/AGE: 1935/10/21 80 y.o.  Admit date: 02/20/2015 Discharge date: 02/21/2015  Admission Diagnoses:  Chest pain  Discharge Diagnoses:  Principal Problem:   Chest pain Active Problems:   Hypothyroidism   Hyperlipidemia   Essential hypertension   Discharged Condition: stable  Hospital Course:   Colleen [name] is a 80 y.o. female overweight married Caucasian female, the mother of 2, grandmother to 5 grandchildren, who was last seen 47 months ago. She has a history of PVOD, status post left carotid endarterectomy in the past preceded by a TIA approximately 10-12 years ago by Dr. Sherren Mocha Early. Dr. Tamala Julian  has been following her Dopplers here in  office. Her most recent carotid Dopplers, performed November 01, 2011, revealed moderate right ICA stenosis with a widely patent left endarterectomy site. Other problems include hypertension and hyperlipidemia. She quit smoking approximately 20 years ago. Echo and Myoview performed February 2011 were normal. Dr. Chalmers Cater follows her lipid profile.  Dr. Synetta Shadow the patient on 02/15/15 at which time she was complaining of chest tightness, shortness of breath and fatigue for 1-1-1/2 weeks. She first noticed it all she was at the store carrying a bunch groceries and hurrying to get through the store. She's also noticed it while working in the kitchen and also occasionally while sitting eating dinner. She says if she takes a deep breath it seems to help. He ordered a [place] which had not been completed prior to admission. She presented to the ER With chest tightness which started around 0430 hrs. She also reported a lot of gas and belching. She said her heart was also pounding, BP was over 200 and HR irregular. BP in the ER 193/86 at 0500hrs. Still with some chest tightness in the ER. She denied nausea, vomiting, fever, shortness of breath,  orthopnea, dizziness, PND, cough, congestion, abdominal pain, hematochezia, melena, lower extremity edema, claudication.  She was admitted for observation. She had no further chest pain. She ruled out for MI. She underwent a Lexiscan Myoview which was negative for ischemia and low risk. Lisinopril was increased to 30 mg daily due to poor blood pressure control.  Atenolol was discontinued due to persistent bradycardia in the 40s and 50s. The patient was seen by Dr. Radford Pax who felt she was stable for DC home.   Consults: none  Significant Diagnostic Studies:  MYOCARDIAL IMAGING WITH SPECT (REST AND PHARMACOLOGIC-STRESS)  GATED LEFT VENTRICULAR WALL MOTION STUDY  LEFT VENTRICULAR EJECTION FRACTION  TECHNIQUE: Standard myocardial SPECT imaging was performed after resting intravenous injection of 10 mCi Tc-43m sestamibi. Subsequently, intravenous infusion of Lexiscan was performed under the supervision of the Cardiology staff. At peak effect of the drug, 30 mCi Tc-78m sestamibi was injected intravenously and standard myocardial SPECT imaging was performed. Quantitative gated imaging was also performed to evaluate left ventricular wall motion, and estimate left ventricular ejection fraction.  COMPARISON: Chest radiograph of 02/20/2015.  FINDINGS: Perfusion: No decreased activity in the left ventricle on stress imaging to suggest reversible ischemia or infarction.  Wall Motion: Normal left ventricular wall motion. No left ventricular dilation.  Left Ventricular Ejection Fraction: 73 %  End diastolic volume 74 ml  End systolic volume 20 ml  IMPRESSION: 1. No reversible ischemia or infarction.  2. Normal left ventricular wall motion.  3. Left ventricular ejection fraction 73%  4. Low-risk stress test findings*.  Treatments: See above  Discharge Exam: Blood pressure 128/46, pulse 51, temperature  98.3 F (36.8 C), temperature source Oral, resp. rate 20, height 5'  5.5" (1.664 m), weight 177 lb 1.6 oz (80.332 kg), SpO2 95 %.   Disposition:       Discharge Instructions    Diet - low sodium heart healthy    Complete by:  As directed      Increase activity slowly    Complete by:  As directed             Medication List    STOP taking these medications        atenolol 50 MG tablet  Commonly known as:  TENORMIN     lisinopril-hydrochlorothiazide 20-12.5 MG per tablet  Commonly known as:  PRINZIDE,ZESTORETIC      TAKE these medications        GNP CALCIUM 1500 (600 CA) MG Tabs  Generic drug:  Calcium Carbonate  Take 1,500 mg by mouth daily.     hydrochlorothiazide 12.5 MG capsule  Commonly known as:  MICROZIDE  Take 1 capsule (12.5 mg total) by mouth at bedtime.     levothyroxine 100 MCG tablet  Commonly known as:  SYNTHROID, LEVOTHROID  Take 100 mcg by mouth daily.     lisinopril 30 MG tablet  Commonly known as:  PRINIVIL,ZESTRIL  Take 1 tablet (30 mg total) by mouth daily.     magnesium 30 MG tablet  Take 30 mg by mouth daily.     niacin 500 MG tablet  Take 500 mg by mouth daily with breakfast. otc     Potassium 99 MG Tabs  Take 99 mg by mouth daily. otc     vitamin B-12 500 MCG tablet  Commonly known as:  CYANOCOBALAMIN  Take 500 mcg by mouth daily.     Vitamin D3 5000 UNITS Caps  Take 5,000 Units by mouth every Monday.       Follow-up Information    Follow up with Lorretta Harp, MD On 04/04/2015.   Specialty:  Cardiology   Why:  11:45 AM   Contact information:   [address] [place] [address] [phone]      Greater than 30 minutes was spent completing the patient's discharge.    SignedSueanne Margarita, PAC 02/22/2015, 1:52 PM

## 2015-02-28 ENCOUNTER — Encounter (HOSPITAL_COMMUNITY): Payer: Medicare Other

## 2015-02-28 ENCOUNTER — Telehealth: Payer: Self-pay | Admitting: *Deleted

## 2015-02-28 DIAGNOSIS — I6523 Occlusion and stenosis of bilateral carotid arteries: Secondary | ICD-10-CM

## 2015-02-28 NOTE — Telephone Encounter (Signed)
-----   Message from Lorretta Harp, MD sent at 02/21/2015  4:05 PM EST ----- No change from prior study. Repeat in 24 months.

## 2015-02-28 NOTE — Telephone Encounter (Signed)
Carotid doppler results called. Voiced understanding.  Order placed for recheck in 2 years.

## 2015-03-08 ENCOUNTER — Ambulatory Visit: Payer: Medicare Other | Admitting: Cardiovascular Disease

## 2015-04-04 ENCOUNTER — Ambulatory Visit (INDEPENDENT_AMBULATORY_CARE_PROVIDER_SITE_OTHER): Payer: Medicare Other | Admitting: Cardiovascular Disease

## 2015-04-04 ENCOUNTER — Encounter: Payer: Self-pay | Admitting: Cardiovascular Disease

## 2015-04-04 VITALS — BP 138/82 | HR 57 | Ht 65.0 in | Wt 180.0 lb

## 2015-04-04 DIAGNOSIS — I779 Disorder of arteries and arterioles, unspecified: Secondary | ICD-10-CM | POA: Diagnosis not present

## 2015-04-04 DIAGNOSIS — I739 Peripheral vascular disease, unspecified: Secondary | ICD-10-CM

## 2015-04-04 DIAGNOSIS — R079 Chest pain, unspecified: Secondary | ICD-10-CM

## 2015-04-04 DIAGNOSIS — E785 Hyperlipidemia, unspecified: Secondary | ICD-10-CM | POA: Diagnosis not present

## 2015-04-04 DIAGNOSIS — I10 Essential (primary) hypertension: Secondary | ICD-10-CM

## 2015-04-04 NOTE — Assessment & Plan Note (Signed)
History of carotid artery disease status post left carotid endarterectomy performed by Dr. Verita Schneiders approximately 12 years ago. Recent Dopplers performed in our office 12/12/15 revealed mild right ICA stenosis with a widely patent left endarterectomy site. We will repeat these in 2 years.

## 2015-04-04 NOTE — Assessment & Plan Note (Signed)
History of hypertension blood pressure measured today 130/82. She is on atenolol, lisinopril and hydrochlorothiazide.. Continue current meds at current dosing

## 2015-04-04 NOTE — Patient Instructions (Signed)
Your physician wants you to follow-up in: 1 year with Dr Berry. You will receive a reminder letter in the mail two months in advance. If you don't receive a letter, please call our office to schedule the follow-up appointment.  

## 2015-04-04 NOTE — Assessment & Plan Note (Signed)
Colleen [name] had a recent admission for chest pain in February. A Myoview stress test was completely normal. She's had no recurrent symptoms.

## 2015-04-04 NOTE — Progress Notes (Signed)
04/04/2015 Colleen [name]   1934-12-27  [id number]  Primary Physician Garnet Koyanagi, DO Primary Cardiologist: Lorretta Harp MD [name]   HPI:   The patient is a 80 year old mildly to moderately overweight married Caucasian female, the mother of 2, grandmother to 5 grandchildren, whom I last saw 6 months ago. She has a history of PVOD, status post left carotid endarterectomy in the past preceded by a TIA approximately 10-12 years ago by Dr. Sherren Mocha Early. We have been following her Dopplers here in our office. Her most recent carotid Dopplers, performed November 01, 2011, revealed moderate right ICA stenosis with a widely patent left endarterectomy site. Other problems include hypertension and hyperlipidemia. She quit smoking approximately 20 years ago. She denies chest pain or shortness of breath. Echo and Myoview performed February 2011 were normal. Dr. Chalmers Cater follows her lipid profile. Since I saw her in the office back in October she did develop chest pain and was admitted to [facility] 02/20/15. She ruled out for myocardial infarction. A Myoview stress test was low risk and nonischemic with normal LV function. She has had no recurrent symptoms.  Current Outpatient Prescriptions  Medication Sig Dispense Refill  . atenolol (TENORMIN) 25 MG tablet Take 25 mg by mouth daily.    . Calcium Carbonate (GNP CALCIUM) 1500 MG TABS Take 1,500 mg by mouth daily.     . Cholecalciferol (VITAMIN D3) 5000 UNITS CAPS Take 5,000 Units by mouth every Monday.    . levothyroxine (SYNTHROID, LEVOTHROID) 100 MCG tablet Take 100 mcg by mouth daily.      Marland Kitchen lisinopril-hydrochlorothiazide (PRINZIDE,ZESTORETIC) 20-12.5 MG per tablet Take 1 tablet by mouth daily.    . magnesium 30 MG tablet Take 30 mg by mouth daily.     . niacin 500 MG tablet Take 500 mg by mouth daily with breakfast. otc    . Potassium 99 MG TABS Take 99 mg by mouth daily. otc    . vitamin B-12 (CYANOCOBALAMIN) 500 MCG tablet  Take 500 mcg by mouth daily.     No current facility-administered medications for this visit.    No Known Allergies  History   Social History  . Marital Status: Married    Spouse Name: N/A  . Number of Children: N/A  . Years of Education: N/A   Occupational History  . Not on file.   Social History Main Topics  . Smoking status: Former Research scientist (life sciences)  . Smokeless tobacco: Not on file  . Alcohol Use: Yes  . Drug Use: No  . Sexual Activity: Not on file   Other Topics Concern  . Not on file   Social History Narrative     Review of Systems: General: negative for chills, fever, night sweats or weight changes.  Cardiovascular: negative for chest pain, dyspnea on exertion, edema, orthopnea, palpitations, paroxysmal nocturnal dyspnea or shortness of breath Dermatological: negative for rash Respiratory: negative for cough or wheezing Urologic: negative for hematuria Abdominal: negative for nausea, vomiting, diarrhea, bright red blood per rectum, melena, or hematemesis Neurologic: negative for visual changes, syncope, or dizziness All other systems reviewed and are otherwise negative except as noted above.    Blood pressure 138/82, pulse 57, height 5\' 5"  (1.651 m), weight 180 lb (81.647 kg).  General appearance: alert and no distress Neck: no adenopathy, no carotid bruit, no JVD, supple, symmetrical, trachea midline and thyroid not enlarged, symmetric, no tenderness/mass/nodules Lungs: clear to auscultation bilaterally Heart: regular rate and rhythm, S1, S2 normal,  no murmur, click, rub or gallop Extremities: extremities normal, atraumatic, no cyanosis or edema  EKG sinus bradycardia 57 with left axis deviation. I personally reviewed this EKG  ASSESSMENT AND PLAN:   Hyperlipidemia History of hyperlipidemia with recent lipid profile performed 02/21/15 revealing a total cholesterol 140, LDL of 83 and HDL 47   Essential hypertension History of hypertension blood pressure measured  today 130/82. She is on atenolol, lisinopril and hydrochlorothiazide.. Continue current meds at current dosing   Chest pain Ms. Latella had a recent admission for chest pain in February. A Myoview stress test was completely normal. She's had no recurrent symptoms.   Carotid artery disease History of carotid artery disease status post left carotid endarterectomy performed by Dr. Verita Schneiders approximately 12 years ago. Recent Dopplers performed in our office 12/12/15 revealed mild right ICA stenosis with a widely patent left endarterectomy site. We will repeat these in 2 years.       Lorretta Harp MD FACP,FACC,FAHA, [facility] 04/04/2015 12:59 PM

## 2015-04-04 NOTE — Assessment & Plan Note (Signed)
History of hyperlipidemia with recent lipid profile performed 02/21/15 revealing a total cholesterol 140, LDL of 83 and HDL 47

## 2015-07-22 ENCOUNTER — Emergency Department (HOSPITAL_COMMUNITY): Payer: Medicare Other

## 2015-07-22 ENCOUNTER — Emergency Department (HOSPITAL_COMMUNITY)
Admission: EM | Admit: 2015-07-22 | Discharge: 2015-07-22 | Disposition: A | Discharge status: Home / Self Care | Payer: Medicare Other | Attending: Emergency Medicine | Admitting: Emergency Medicine

## 2015-07-22 ENCOUNTER — Encounter (HOSPITAL_COMMUNITY): Payer: Self-pay | Admitting: *Deleted

## 2015-07-22 DIAGNOSIS — I459 Conduction disorder, unspecified: Secondary | ICD-10-CM | POA: Diagnosis not present

## 2015-07-22 DIAGNOSIS — I10 Essential (primary) hypertension: Secondary | ICD-10-CM | POA: Diagnosis not present

## 2015-07-22 DIAGNOSIS — Z8673 Personal history of transient ischemic attack (TIA), and cerebral infarction without residual deficits: Secondary | ICD-10-CM | POA: Diagnosis not present

## 2015-07-22 DIAGNOSIS — E039 Hypothyroidism, unspecified: Secondary | ICD-10-CM | POA: Insufficient documentation

## 2015-07-22 DIAGNOSIS — R0602 Shortness of breath: Secondary | ICD-10-CM | POA: Diagnosis present

## 2015-07-22 DIAGNOSIS — Z79899 Other long term (current) drug therapy: Secondary | ICD-10-CM | POA: Insufficient documentation

## 2015-07-22 DIAGNOSIS — E785 Hyperlipidemia, unspecified: Secondary | ICD-10-CM | POA: Insufficient documentation

## 2015-07-22 DIAGNOSIS — Z87891 Personal history of nicotine dependence: Secondary | ICD-10-CM | POA: Insufficient documentation

## 2015-07-22 DIAGNOSIS — J90 Pleural effusion, not elsewhere classified: Secondary | ICD-10-CM | POA: Diagnosis not present

## 2015-07-22 LAB — BASIC METABOLIC PANEL
ANION GAP: 9 (ref 5–15)
BUN: 26 mg/dL — ABNORMAL HIGH (ref 6–20)
CO2: 25 mmol/L (ref 22–32)
CREATININE: 1.17 mg/dL — AB (ref 0.44–1.00)
Calcium: 9.1 mg/dL (ref 8.9–10.3)
Chloride: 102 mmol/L (ref 101–111)
GFR calc Af Amer: 51 mL/min — ABNORMAL LOW (ref >60)
GFR, EST NON AFRICAN AMERICAN: 44 mL/min — AB (ref >60)
Glucose, Bld: 122 mg/dL — ABNORMAL HIGH (ref 65–99)
Potassium: 4.3 mmol/L (ref 3.5–5.1)
SODIUM: 136 mmol/L (ref 135–145)

## 2015-07-22 LAB — I-STAT TROPONIN, ED: Troponin i, poc: 0.02 ng/mL (ref 0.00–0.08)

## 2015-07-22 LAB — CBC
HCT: 33.8 % — ABNORMAL LOW (ref 36.0–46.0)
Hemoglobin: 11.1 g/dL — ABNORMAL LOW (ref 12.0–15.0)
MCH: 33.6 pg (ref 26.0–34.0)
MCHC: 32.8 g/dL (ref 30.0–36.0)
MCV: 102.4 fL — ABNORMAL HIGH (ref 78.0–100.0)
Platelets: 223 10*3/uL (ref 150–400)
RBC: 3.3 MIL/uL — ABNORMAL LOW (ref 3.87–5.11)
RDW: 13 % (ref 11.5–15.5)
WBC: 6.2 10*3/uL (ref 4.0–10.5)

## 2015-07-22 LAB — BRAIN NATRIURETIC PEPTIDE: B NATRIURETIC PEPTIDE 5: 394.9 pg/mL — AB (ref 0.0–100.0)

## 2015-07-22 MED ORDER — FUROSEMIDE 10 MG/ML IJ SOLN
40.0000 mg | INTRAMUSCULAR | Status: AC
  Administered 2015-07-22: 40 mg via INTRAVENOUS
  Filled 2015-07-22: qty 4

## 2015-07-22 MED ORDER — FUROSEMIDE 20 MG PO TABS: 40.0000 mg | ORAL_TABLET | Freq: Every day | ORAL | Status: DC

## 2015-07-22 MED ORDER — ATENOLOL 25 MG PO TABS: 12.5000 mg | ORAL_TABLET | Freq: Every evening | ORAL | Status: DC

## 2015-07-22 NOTE — ED Provider Notes (Addendum)
CSN: [id number]     Arrival date & time 07/22/15  1645 History   First MD Initiated Contact with Patient 07/22/15 1712     Chief Complaint  Patient presents with  . Shortness of Breath     (Consider location/radiation/quality/duration/timing/severity/associated sxs/prior Treatment) HPI Comments: The patient is a 80 year old female, she has a history of hyperlipidemia, hypertension, hypothyroidism and an carotid endarterectomy on the left many years ago. She currently takes lisinopril and hydrochlorothiazide for hypertension and levothyroxine for hypothyroidism. She reports approximately 10 days ago she underwent right eye retinal surgery for a retinal growth. Since that time she has had ongoing dyspnea on exertion and notes that she cannot even climb the stairs without significant shortness of breath. She denies orthopnea or exertional chest pain but has had a heaviness or pressure feeling in her lower neck since surgery. This was not general anesthesia, she was not intubated, she has had no difficulty speaking swallowing or breathing and has had no fevers or chills. She does have coughing today only. She denies any chest pain on exertion, she states she had a stress test approximately 4 or 5 months ago which she reports to be normal.  Patient is a 80 y.o. female presenting with shortness of breath. The history is provided by the patient.  Shortness of Breath   Past Medical History  Diagnosis Date  . Hyperlipidemia   . Hypertension   . Hypothyroidism   . TIA (transient ischemic attack) 2001  . Carotid artery disease    Past Surgical History  Procedure Laterality Date  . Cholecystectomy    . Abdominal hysterectomy  1975    TAH,BSO  . Carotid endarterectomy  2001    left  . Doppler echocardiography  02/13/2010    EF>55%  . Doppler echocardiography  09/29/2007  . Nm myoview ltd  02/13/2010  . Carotid duplex  11/24/2012  . Carotid duplex  11/01/2010  . Renal duplex  02/13/2010   No  family history on file. History  Substance Use Topics  . Smoking status: Former Research scientist (life sciences)  . Smokeless tobacco: Not on file  . Alcohol Use: Yes   OB History    No data available     Review of Systems  Respiratory: Positive for shortness of breath.       Allergies  Review of patient's allergies indicates no known allergies.  Home Medications   Prior to Admission medications   Medication Sig Start Date End Date Taking? Authorizing Provider  Calcium Carbonate (GNP CALCIUM) 1500 MG TABS Take 1,500 mg by mouth every evening.    Yes Historical Provider, MD  Cholecalciferol 1000 UNITS tablet Take 1,000 Units by mouth every evening.    Yes Historical Provider, MD  levothyroxine (SYNTHROID, LEVOTHROID) 100 MCG tablet Take 100 mcg by mouth daily before breakfast.    Yes Historical Provider, MD  lisinopril-hydrochlorothiazide (PRINZIDE,ZESTORETIC) 20-12.5 MG per tablet Take 1 tablet by mouth every evening.    Yes Historical Provider, MD  magnesium 30 MG tablet Take 30 mg by mouth every evening.    Yes Historical Provider, MD  Misc Natural Products (TURMERIC CURCUMIN) CAPS Take 1 capsule by mouth every evening.   Yes Historical Provider, MD  niacin 500 MG tablet Take 500 mg by mouth every evening. otc   Yes Historical Provider, MD  Potassium 99 MG TABS Take 99 mg by mouth every evening. otc   Yes Historical Provider, MD  vitamin B-12 (CYANOCOBALAMIN) 500 MCG tablet Take 500 mcg by mouth every evening.  Yes Historical Provider, MD  atenolol (TENORMIN) 25 MG tablet Take 0.5 tablets (12.5 mg total) by mouth every evening. 07/22/15   Noemi Chapel, MD   BP 133/61 mmHg  Pulse 78  Temp(Src) 98.9 F (37.2 C)  Resp 17  SpO2 96% Physical Exam  Constitutional: She appears well-developed and well-nourished. No distress.  HENT:  Head: Normocephalic and atraumatic.  Mouth/Throat: Oropharynx is clear and moist. No oropharyngeal exudate.  Eyes: Conjunctivae and EOM are normal. Pupils are equal,  round, and reactive to light. Right eye exhibits no discharge. Left eye exhibits no discharge. No scleral icterus.  Neck: Normal range of motion. Neck supple. No JVD present. No thyromegaly present.  Cardiovascular: Normal rate, normal heart sounds and intact distal pulses.  Exam reveals no gallop and no friction rub.   No murmur heard. Occasional ectopy - seems to be dropping beats on the monitor  Pulmonary/Chest: Effort normal and breath sounds normal. No respiratory distress. She has no wheezes. She has no rales.  Abdominal: Soft. Bowel sounds are normal. She exhibits no distension and no mass. There is no tenderness.  Musculoskeletal: Normal range of motion. She exhibits no edema or tenderness.  Lymphadenopathy:    She has no cervical adenopathy.  Neurological: She is alert. Coordination normal.  Skin: Skin is warm and dry. No rash noted. No erythema.  Psychiatric: She has a normal mood and affect. Her behavior is normal.  Nursing note and vitals reviewed.   ED Course  Procedures (including critical care time) Labs Review Labs Reviewed  BASIC METABOLIC PANEL - Abnormal; Notable for the following:    Glucose, Bld 122 (*)    BUN 26 (*)    Creatinine, Ser 1.17 (*)    GFR calc non Af Amer 44 (*)    GFR calc Af Amer 51 (*)    All other components within normal limits  CBC - Abnormal; Notable for the following:    RBC 3.30 (*)    Hemoglobin 11.1 (*)    HCT 33.8 (*)    MCV 102.4 (*)    All other components within normal limits  BRAIN NATRIURETIC PEPTIDE - Abnormal; Notable for the following:    B Natriuretic Peptide 394.9 (*)    All other components within normal limits  I-STAT TROPOININ, ED    Imaging Review Dg Chest 2 View  07/22/2015   CLINICAL DATA:  Shortness of breath, dry cough.  EXAM: CHEST  2 VIEW  COMPARISON:  02/20/2015  FINDINGS: There is mild cardiomegaly and vascular congestion. No overt edema. No confluent opacities or effusions. No acute bony abnormality.   IMPRESSION: Cardiomegaly, vascular congestion.   Electronically Signed   By: Rolm Baptise M.D.   On: 07/22/2015 18:48     EKG Interpretation   Date/Time:  Saturday July 22 2015 17:04:13 EDT Ventricular Rate:  79 PR Interval:    QRS Duration: 84 QT Interval:  382 QTC Calculation: 438 R Axis:   -17 Text Interpretation:   Critical Test Result: AV Block Sinus tachycardia  with 2nd degree A-V block (Mobitz I) Low voltage QRS Cannot rule out  Anterior infarct , age undetermined Abnormal ECG prolonged PR interval  new, ddropped QRS new Abnormal ekg Confirmed by Freddrick Gladson  MD, Evaluna Utke ([id number])  on 07/22/2015 5:27:55 PM      MDM   Final diagnoses:  Pleural effusion  Heart block  Last stress test report from 3/16:  IMPRESSION: 1. No reversible ischemia or infarction.  2. Normal left ventricular wall  motion.  3. Left ventricular ejection fraction 73%  4. Low-risk stress test findings*.   The patient has evidence of a type II second-degree AV block, she has no prior first-degree AV block on prior EKGs of this is concerning. She has no history of cardiac disease but does have a low voltage QRS. Will obtain a bedside ultrasound to look for pericardial effusion. Chest x-ray, labs, troponin, chest x-ray.  The pt has had some  Low blood pressure in the last 24 hours and thus did not take her atenolol last night.    Will d/w Cardiology - Dr. Gwenlyn Found is primary cardiologist.  Bedside Echo shows that there are skipped beats every 4-5 beats.    Paged Cardiology at 6:35 PM - No signs of peripheral edema, no JVD and no rales / wheezing.  Discussed care with the cardiologist who has seen the patient in the emergency department at 7:00. He states that atenolol should be held this evening, start back at a half dose tomorrow at 12.5 mg. He also recommends Lasix here and for the next several days. She can follow up outpatient with her cardiologist. He believes this potential block is likely related to  the beta blocker. The patient has no hypotension, no dizziness, no generalized weakness to suggest that she has a clinically significant heart block. The patient is in agreement with the plan.   the patient has been seen by the cardiologist, she understands indications for return, she continues to have intermittent dropped beats, she has ambulated and has a hypoxic response going down to 89% on room air. She states that she does not want stay in the hospital after I explained why this would be important given her new hypoxia but would rather follow up outpatient. I have discussed with her the indications for return and she expresses her understanding.   Meds given in ED:  Medications  furosemide (LASIX) injection 40 mg (40 mg Intravenous Given 07/22/15 1953)    New Prescriptions   No medications on file      Noemi Chapel, MD 07/22/15 2125  Noemi Chapel, MD 08/11/15 [phone]

## 2015-07-22 NOTE — ED Notes (Signed)
Dr Miller at bedside. 

## 2015-07-22 NOTE — ED Notes (Signed)
Pt reports increasing SOB for 1 week. Pt denies chest pain SOB at rest and with exertion.

## 2015-07-22 NOTE — ED Notes (Signed)
Pt. Ambulated maintaining 90/91% o2 room air, lowest 87%. Resp 24, slight sob while walking and had to stop once. Pt. States breathing easier than before but still difficult.

## 2015-07-22 NOTE — Discharge Instructions (Signed)
Don't take the Atenolol tonight Start taking 12.5 mg tomorrow.  Return to the ER immediately if you develop increased weakness, fainting, chest pain or difficulty breathing worse than it is tonight.

## 2015-07-26 ENCOUNTER — Telehealth: Payer: Self-pay | Admitting: Family Medicine

## 2015-07-26 NOTE — Telephone Encounter (Signed)
Pt would like to transfer care to [facility] due to closer location

## 2015-07-27 NOTE — Telephone Encounter (Signed)
That is fine 

## 2015-07-27 NOTE — Telephone Encounter (Signed)
Pls advise.  

## 2015-07-28 ENCOUNTER — Ambulatory Visit (INDEPENDENT_AMBULATORY_CARE_PROVIDER_SITE_OTHER): Payer: Medicare Other | Admitting: Cardiovascular Disease

## 2015-07-28 ENCOUNTER — Other Ambulatory Visit: Payer: Self-pay | Admitting: Cardiovascular Disease

## 2015-07-28 ENCOUNTER — Encounter: Payer: Self-pay | Admitting: Cardiovascular Disease

## 2015-07-28 ENCOUNTER — Telehealth: Payer: Self-pay | Admitting: Cardiovascular Disease

## 2015-07-28 ENCOUNTER — Telehealth: Payer: Self-pay | Admitting: *Deleted

## 2015-07-28 ENCOUNTER — Encounter (HOSPITAL_COMMUNITY): Payer: Self-pay

## 2015-07-28 ENCOUNTER — Ambulatory Visit (HOSPITAL_COMMUNITY)
Admission: RE | Admit: 2015-07-28 | Discharge: 2015-07-28 | Disposition: A | Discharge status: Home / Self Care | Payer: Medicare Other | Source: Ambulatory Visit | Attending: Cardiovascular Disease | Admitting: Cardiovascular Disease

## 2015-07-28 ENCOUNTER — Ambulatory Visit (HOSPITAL_BASED_OUTPATIENT_CLINIC_OR_DEPARTMENT_OTHER)
Admission: RE | Admit: 2015-07-28 | Discharge: 2015-07-28 | Disposition: A | Discharge status: Home / Self Care | Payer: Medicare Other | Source: Ambulatory Visit | Attending: Cardiovascular Disease | Admitting: Cardiovascular Disease

## 2015-07-28 VITALS — BP 130/66 | HR 60 | Ht 65.0 in | Wt 185.0 lb

## 2015-07-28 DIAGNOSIS — R590 Localized enlarged lymph nodes: Secondary | ICD-10-CM

## 2015-07-28 DIAGNOSIS — E785 Hyperlipidemia, unspecified: Secondary | ICD-10-CM

## 2015-07-28 DIAGNOSIS — R0602 Shortness of breath: Secondary | ICD-10-CM

## 2015-07-28 DIAGNOSIS — I081 Rheumatic disorders of both mitral and tricuspid valves: Secondary | ICD-10-CM | POA: Insufficient documentation

## 2015-07-28 DIAGNOSIS — I2510 Atherosclerotic heart disease of native coronary artery without angina pectoris: Secondary | ICD-10-CM | POA: Insufficient documentation

## 2015-07-28 DIAGNOSIS — Z87891 Personal history of nicotine dependence: Secondary | ICD-10-CM | POA: Insufficient documentation

## 2015-07-28 DIAGNOSIS — J90 Pleural effusion, not elsewhere classified: Secondary | ICD-10-CM | POA: Insufficient documentation

## 2015-07-28 DIAGNOSIS — I517 Cardiomegaly: Secondary | ICD-10-CM

## 2015-07-28 DIAGNOSIS — R3911 Hesitancy of micturition: Secondary | ICD-10-CM

## 2015-07-28 DIAGNOSIS — R001 Bradycardia, unspecified: Secondary | ICD-10-CM | POA: Insufficient documentation

## 2015-07-28 DIAGNOSIS — I779 Disorder of arteries and arterioles, unspecified: Secondary | ICD-10-CM

## 2015-07-28 DIAGNOSIS — I10 Essential (primary) hypertension: Secondary | ICD-10-CM

## 2015-07-28 DIAGNOSIS — I739 Peripheral vascular disease, unspecified: Secondary | ICD-10-CM

## 2015-07-28 DIAGNOSIS — I5033 Acute on chronic diastolic (congestive) heart failure: Secondary | ICD-10-CM | POA: Diagnosis not present

## 2015-07-28 MED ORDER — IOHEXOL 350 MG/ML SOLN
100.0000 mL | Freq: Once | INTRAVENOUS | Status: AC | PRN
  Administered 2015-07-28: 100 mL via INTRAVENOUS

## 2015-07-28 NOTE — Progress Notes (Signed)
07/28/2015 Colleen [name]   25-Sep-1935  [id number]  Primary Physician Garnet Koyanagi, DO Primary Cardiologist: Lorretta Harp MD [name]   HPI:    The patient is a 80 year old mildly to moderately overweight married Caucasian female, the mother of 2, grandmother to 5 grandchildren, whom I last saw /12/16.Marland Kitchen She has a history of PVOD, status post left carotid endarterectomy in the past preceded by a TIA approximately 10-12 years ago by Dr. Sherren Mocha Early. We have been following her Dopplers here in our office. Her most recent carotid Dopplers, performed November 01, 2011, revealed moderate right ICA stenosis with a widely patent left endarterectomy site. Other problems include hypertension and hyperlipidemia. She quit smoking approximately 20 years ago. She denies chest pain or shortness of breath. Echo and Myoview performed February 2011 were normal. Dr. Chalmers Cater follows her lipid profile. Since I saw her in the office back in October she did develop chest pain and was admitted to [facility] 02/20/15. She ruled out for myocardial infarction. A Myoview stress test was low risk and nonischemic with normal LV function. She has had no recurrent symptoms. She had retinal surgery performed as an outpatient 07/12/15 and was somewhat inactive after that time. She gradually developed increasing shortness of breath at rest and with exertion. She was seen in the emergency room 07/22/15 where chest x-ray showed subtle interstitial edema. BNP was elevated at 394. Troponins were negative. She had when she block and a informal consultation with cardiology suggested that they decrease her beta blocker. She does appear mildly dyspneic. Her EKG shows junctional bradycardia at 52. She denies chest pain.   Current Outpatient Prescriptions  Medication Sig Dispense Refill  . atenolol (TENORMIN) 25 MG tablet Take 0.5 tablets (12.5 mg total) by mouth every evening. 30 tablet 1  . Calcium Carbonate (GNP  CALCIUM) 1500 MG TABS Take 1,500 mg by mouth every evening.     . Cholecalciferol 1000 UNITS tablet Take 1,000 Units by mouth every evening.     . furosemide (LASIX) 20 MG tablet Take 2 tablets (40 mg total) by mouth daily. 10 tablet 0  . levothyroxine (SYNTHROID, LEVOTHROID) 100 MCG tablet Take 100 mcg by mouth daily before breakfast.     . lisinopril-hydrochlorothiazide (PRINZIDE,ZESTORETIC) 20-12.5 MG per tablet Take 1 tablet by mouth every evening.     . magnesium 30 MG tablet Take 30 mg by mouth every evening.     . Misc Natural Products (TURMERIC CURCUMIN) CAPS Take 1 capsule by mouth every evening.    . niacin 500 MG tablet Take 500 mg by mouth every evening. otc    . Potassium 99 MG TABS Take 99 mg by mouth every evening. otc    . vitamin B-12 (CYANOCOBALAMIN) 500 MCG tablet Take 500 mcg by mouth every evening.      No current facility-administered medications for this visit.    No Known Allergies  History   Social History  . Marital Status: Married    Spouse Name: N/A  . Number of Children: N/A  . Years of Education: N/A   Occupational History  . Not on file.   Social History Main Topics  . Smoking status: Former Research scientist (life sciences)  . Smokeless tobacco: Not on file  . Alcohol Use: Yes  . Drug Use: No  . Sexual Activity: Not on file   Other Topics Concern  . Not on file   Social History Narrative     Review of Systems: General: negative  for chills, fever, night sweats or weight changes.  Cardiovascular: negative for chest pain, dyspnea on exertion, edema, orthopnea, palpitations, paroxysmal nocturnal dyspnea or shortness of breath Dermatological: negative for rash Respiratory: negative for cough or wheezing Urologic: negative for hematuria Abdominal: negative for nausea, vomiting, diarrhea, bright red blood per rectum, melena, or hematemesis Neurologic: negative for visual changes, syncope, or dizziness All other systems reviewed and are otherwise negative except as noted  above.    Blood pressure 130/66, pulse 60, height 5\' 5"  (1.651 m), weight 185 lb (83.915 kg).  General appearance: alert and no distress Neck: no adenopathy, no JVD, supple, symmetrical, trachea midline, thyroid not enlarged, symmetric, no tenderness/mass/nodules and bilateral carotid bruits, mild increased jugular venous pressure Lungs: clear to auscultation bilaterally Heart: regular rate and rhythm, S1, S2 normal, no murmur, click, rub or gallop Extremities: extremities normal, atraumatic, no cyanosis or edema  EKG junctional bradycardia at 52 without ST or T-wave changes.  ASSESSMENT AND PLAN:   Hyperlipidemia History of hyperlipidemia not on a statin drug with recent lipid profile performed 02/21/15 revealing total cholesterol 140, LDL 83 and HDL 47  Essential hypertension History of hypertension with blood pressure measured at 130/66. She is on lisinopril and hydrochlorothiazide as well as a reduced dose of atenolol. Continue current meds at current dosing  Carotid artery disease History of left carotid endarterectomy in the past performed by Dr. Donnetta Hutching over 10 years ago. Her most recent Dopplers performed 02/11/15 revealed mild right ICA stenosis with a widely patent left endarterectomy site  Junctional bradycardia The patient's EKG today shows junctional bradycardia with a heart rate of 52. She is on low-dose beta blocker which was recently reduced in the setting of Mobitz type II second-degree AV block. I'm going to discontinue her beta blocker today. This may be contributing to her shortness of breath  Shortness of breath Ms. Merced had retinal surgery on July 20, was somewhat inactive after that and developed increasing shortness of breath 4-5 days later requiring an ER visit on 7/30. She smoked remotely having stopped 15-20 years ago. She's had normal LV function in the past and a nonischemic Myoview as recently as March of this year. In the emergency room her chest x-ray showed  mild interstitial edema. She is placed on Lasix 40 mg a day which resulted in mild improvement. Her BNP was measured at 394. I'm going to stop her beta blocker today. We'll check a stat today. Limited echo for LV function and obtain a CT angiogram rule out embolism given the fact that she was inactive for a week. She'll be seen in [facility] next week for follow-up of her tests in the past week after.      Lorretta Harp MD FACP,FACC,FAHA, [facility] 07/28/2015 9:46 AM

## 2015-07-28 NOTE — Assessment & Plan Note (Signed)
History of hypertension with blood pressure measured at 130/66. She is on lisinopril and hydrochlorothiazide as well as a reduced dose of atenolol. Continue current meds at current dosing

## 2015-07-28 NOTE — Telephone Encounter (Signed)
Tedra Coupe notified that report is negative for PE. They will let patient go home. Patient will be notified of final report when it comes to MD basket  Received a call from [facility] with radiology and notified her that MD is aware test is negative for PE and that report is in [facility]

## 2015-07-28 NOTE — Telephone Encounter (Signed)
Diane is calling in to give a report on the pt's CT Angio chest test that was done today  Thanks

## 2015-07-28 NOTE — Patient Instructions (Signed)
Medication Instructions:   Wean off your Atenolol by taking 12.5mg  every other day for one week then stop.   Labwork:  n/a  Testing/Procedures:  1.  Echocardiogram. Echocardiography is a painless test that uses sound waves to create images of your heart. It provides your doctor with information about the size and shape of your heart and how well your heart's chambers and valves are working. This procedure takes approximately one hour. There are no restrictions for this procedure. This test is performed at [address]rd floor.    2. Ct Angio of your chest to rule out a blood clot in your lungs.   Follow-Up:  Next week with a provider or extender  2 weeks with Dr Gwenlyn Found  Any Other Special Instructions Will Be Listed Below (If Applicable).

## 2015-07-28 NOTE — Assessment & Plan Note (Addendum)
Colleen [name] had retinal surgery on July 20, was somewhat inactive after that and developed increasing shortness of breath 4-5 days later requiring an ER visit on 7/30. She smoked remotely having stopped 15-20 years ago. She's had normal LV function in the past and a nonischemic Myoview as recently as March of this year. In the emergency room her chest x-ray showed mild interstitial edema. She is placed on Lasix 40 mg a day which resulted in mild improvement. Her BNP was measured at 394. I'm going to stop her beta blocker today. We'll check a stat today. Limited echo for LV function and obtain a CT angiogram rule out embolism given the fact that she was inactive for a week. She'll be seen in [facility] next week for follow-up of her tests in the past week after.  N.B. Her resting oxygen saturations 92% on room air which caused 85% with talking.

## 2015-07-28 NOTE — Assessment & Plan Note (Signed)
History of left carotid endarterectomy in the past performed by Dr. Donnetta Hutching over 10 years ago. Her most recent Dopplers performed 02/11/15 revealed mild right ICA stenosis with a widely patent left endarterectomy site

## 2015-07-28 NOTE — Assessment & Plan Note (Signed)
History of hyperlipidemia not on a statin drug with recent lipid profile performed 02/21/15 revealing total cholesterol 140, LDL 83 and HDL 47

## 2015-07-28 NOTE — Telephone Encounter (Signed)
Colleen [name] is calling to see if you received a CT report on Mrs. Colleen [name]

## 2015-07-28 NOTE — Telephone Encounter (Signed)
I contacted patient to see how she was doing.  She reported that when she takes her lasix, she is not able to empty her bladder.  She just "sits there on the toilet."  She denies burning or discomfort.  I spoke with Dr Gwenlyn Found, we will go ahead and order a urine culture. Patient aware and will go tomorrow for a sample.

## 2015-07-28 NOTE — Assessment & Plan Note (Signed)
The patient's EKG today shows junctional bradycardia with a heart rate of 52. She is on low-dose beta blocker which was recently reduced in the setting of Mobitz type II second-degree AV block. I'm going to discontinue her beta blocker today. This may be contributing to her shortness of breath

## 2015-07-29 LAB — URINALYSIS
BILIRUBIN URINE: NEGATIVE
Glucose, UA: NEGATIVE
KETONES UR: NEGATIVE
NITRITE: NEGATIVE
PH: 5 (ref 5.0–8.0)
Specific Gravity, Urine: 1.036 — ABNORMAL HIGH (ref 1.001–1.035)

## 2015-07-30 ENCOUNTER — Inpatient Hospital Stay (HOSPITAL_COMMUNITY)
Admission: EM | Admit: 2015-07-30 | Discharge: 2015-08-16 | DRG: 286 | Disposition: A | Discharge status: Home / Self Care | Payer: Medicare Other | Attending: Internal Medicine | Admitting: Internal Medicine

## 2015-07-30 ENCOUNTER — Encounter (HOSPITAL_COMMUNITY): Payer: Self-pay | Admitting: *Deleted

## 2015-07-30 ENCOUNTER — Emergency Department (HOSPITAL_COMMUNITY): Payer: Medicare Other

## 2015-07-30 DIAGNOSIS — I5031 Acute diastolic (congestive) heart failure: Secondary | ICD-10-CM | POA: Diagnosis not present

## 2015-07-30 DIAGNOSIS — J9600 Acute respiratory failure, unspecified whether with hypoxia or hypercapnia: Secondary | ICD-10-CM

## 2015-07-30 DIAGNOSIS — I10 Essential (primary) hypertension: Secondary | ICD-10-CM | POA: Diagnosis present

## 2015-07-30 DIAGNOSIS — N179 Acute kidney failure, unspecified: Secondary | ICD-10-CM | POA: Diagnosis not present

## 2015-07-30 DIAGNOSIS — R0602 Shortness of breath: Secondary | ICD-10-CM

## 2015-07-30 DIAGNOSIS — Z79899 Other long term (current) drug therapy: Secondary | ICD-10-CM

## 2015-07-30 DIAGNOSIS — R001 Bradycardia, unspecified: Secondary | ICD-10-CM | POA: Diagnosis present

## 2015-07-30 DIAGNOSIS — E669 Obesity, unspecified: Secondary | ICD-10-CM | POA: Diagnosis present

## 2015-07-30 DIAGNOSIS — E039 Hypothyroidism, unspecified: Secondary | ICD-10-CM | POA: Diagnosis present

## 2015-07-30 DIAGNOSIS — I509 Heart failure, unspecified: Secondary | ICD-10-CM

## 2015-07-30 DIAGNOSIS — M7989 Other specified soft tissue disorders: Secondary | ICD-10-CM | POA: Diagnosis not present

## 2015-07-30 DIAGNOSIS — R918 Other nonspecific abnormal finding of lung field: Secondary | ICD-10-CM

## 2015-07-30 DIAGNOSIS — I495 Sick sinus syndrome: Secondary | ICD-10-CM | POA: Diagnosis not present

## 2015-07-30 DIAGNOSIS — I272 Other secondary pulmonary hypertension: Secondary | ICD-10-CM | POA: Diagnosis present

## 2015-07-30 DIAGNOSIS — E871 Hypo-osmolality and hyponatremia: Secondary | ICD-10-CM | POA: Diagnosis present

## 2015-07-30 DIAGNOSIS — J9621 Acute and chronic respiratory failure with hypoxia: Secondary | ICD-10-CM | POA: Diagnosis present

## 2015-07-30 DIAGNOSIS — R748 Abnormal levels of other serum enzymes: Secondary | ICD-10-CM | POA: Diagnosis present

## 2015-07-30 DIAGNOSIS — E876 Hypokalemia: Secondary | ICD-10-CM | POA: Diagnosis not present

## 2015-07-30 DIAGNOSIS — Z87891 Personal history of nicotine dependence: Secondary | ICD-10-CM | POA: Diagnosis not present

## 2015-07-30 DIAGNOSIS — J9620 Acute and chronic respiratory failure, unspecified whether with hypoxia or hypercapnia: Secondary | ICD-10-CM

## 2015-07-30 DIAGNOSIS — G4700 Insomnia, unspecified: Secondary | ICD-10-CM | POA: Diagnosis not present

## 2015-07-30 DIAGNOSIS — Z6828 Body mass index (BMI) 28.0-28.9, adult: Secondary | ICD-10-CM

## 2015-07-30 DIAGNOSIS — E785 Hyperlipidemia, unspecified: Secondary | ICD-10-CM | POA: Diagnosis present

## 2015-07-30 DIAGNOSIS — J189 Pneumonia, unspecified organism: Secondary | ICD-10-CM | POA: Diagnosis not present

## 2015-07-30 DIAGNOSIS — I441 Atrioventricular block, second degree: Secondary | ICD-10-CM | POA: Diagnosis present

## 2015-07-30 DIAGNOSIS — J9601 Acute respiratory failure with hypoxia: Secondary | ICD-10-CM | POA: Diagnosis not present

## 2015-07-30 DIAGNOSIS — Z9981 Dependence on supplemental oxygen: Secondary | ICD-10-CM

## 2015-07-30 DIAGNOSIS — R0600 Dyspnea, unspecified: Secondary | ICD-10-CM | POA: Diagnosis not present

## 2015-07-30 DIAGNOSIS — T502X5A Adverse effect of carbonic-anhydrase inhibitors, benzothiadiazides and other diuretics, initial encounter: Secondary | ICD-10-CM | POA: Diagnosis not present

## 2015-07-30 DIAGNOSIS — Z8673 Personal history of transient ischemic attack (TIA), and cerebral infarction without residual deficits: Secondary | ICD-10-CM | POA: Diagnosis not present

## 2015-07-30 DIAGNOSIS — I472 Ventricular tachycardia: Secondary | ICD-10-CM | POA: Diagnosis not present

## 2015-07-30 DIAGNOSIS — R7989 Other specified abnormal findings of blood chemistry: Secondary | ICD-10-CM

## 2015-07-30 DIAGNOSIS — I5033 Acute on chronic diastolic (congestive) heart failure: Secondary | ICD-10-CM | POA: Diagnosis present

## 2015-07-30 DIAGNOSIS — J439 Emphysema, unspecified: Secondary | ICD-10-CM | POA: Diagnosis present

## 2015-07-30 DIAGNOSIS — R0902 Hypoxemia: Secondary | ICD-10-CM

## 2015-07-30 DIAGNOSIS — R778 Other specified abnormalities of plasma proteins: Secondary | ICD-10-CM

## 2015-07-30 HISTORY — DX: Heart failure, unspecified: I50.9

## 2015-07-30 LAB — CBC
HCT: 34 % — ABNORMAL LOW (ref 36.0–46.0)
Hemoglobin: 11.3 g/dL — ABNORMAL LOW (ref 12.0–15.0)
MCH: 33.4 pg (ref 26.0–34.0)
MCHC: 33.2 g/dL (ref 30.0–36.0)
MCV: 100.6 fL — ABNORMAL HIGH (ref 78.0–100.0)
Platelets: 283 K/uL (ref 150–400)
RBC: 3.38 MIL/uL — ABNORMAL LOW (ref 3.87–5.11)
RDW: 13.2 % (ref 11.5–15.5)
WBC: 7.4 K/uL (ref 4.0–10.5)

## 2015-07-30 LAB — URINALYSIS, ROUTINE W REFLEX MICROSCOPIC
Bilirubin Urine: NEGATIVE
Glucose, UA: NEGATIVE mg/dL
Hgb urine dipstick: NEGATIVE
Ketones, ur: NEGATIVE mg/dL
Leukocytes, UA: NEGATIVE
Nitrite: NEGATIVE
PH: 5 (ref 5.0–8.0)
Protein, ur: NEGATIVE mg/dL
Specific Gravity, Urine: 1.005 (ref 1.005–1.030)
UROBILINOGEN UA: 0.2 mg/dL (ref 0.0–1.0)

## 2015-07-30 LAB — TROPONIN I: Troponin I: 0.04 ng/mL — ABNORMAL HIGH

## 2015-07-30 LAB — DIFFERENTIAL

## 2015-07-30 LAB — BASIC METABOLIC PANEL
ANION GAP: 12 (ref 5–15)
BUN: 31 mg/dL — AB (ref 6–20)
CHLORIDE: 95 mmol/L — AB (ref 101–111)
CO2: 24 mmol/L (ref 22–32)
CREATININE: 1.49 mg/dL — AB (ref 0.44–1.00)
Calcium: 9.2 mg/dL (ref 8.9–10.3)
GFR calc Af Amer: 38 mL/min — ABNORMAL LOW (ref >60)
GFR, EST NON AFRICAN AMERICAN: 33 mL/min — AB (ref >60)
Glucose, Bld: 115 mg/dL — ABNORMAL HIGH (ref 65–99)
POTASSIUM: 3.9 mmol/L (ref 3.5–5.1)
SODIUM: 131 mmol/L — AB (ref 135–145)

## 2015-07-30 LAB — BRAIN NATRIURETIC PEPTIDE: B Natriuretic Peptide: 346.3 pg/mL — ABNORMAL HIGH (ref 0.0–100.0)

## 2015-07-30 LAB — URINE CULTURE: Colony Count: >=100000

## 2015-07-30 MED ORDER — FUROSEMIDE 10 MG/ML IJ SOLN
40.0000 mg | Freq: Once | INTRAMUSCULAR | Status: AC
  Administered 2015-07-30: 40 mg via INTRAVENOUS
  Filled 2015-07-30: qty 4

## 2015-07-30 NOTE — ED Provider Notes (Signed)
CSN: [id number]     Arrival date & time 07/30/15  2022 History   First MD Initiated Contact with Patient 07/30/15 2042     Chief Complaint  Patient presents with  . Shortness of Breath     (Consider location/radiation/quality/duration/timing/severity/associated sxs/prior Treatment) Patient is a 80 y.o. female presenting with shortness of breath. The history is provided by the patient.  Shortness of Breath She had been in the emergency department one week ago with dyspnea and was sent home with prescription for furosemide. Since then, she's been taking the furosemide daily but states that she has not been urinating. She is gained 12 pounds in the week, and has noted that her legs are swelling. She did see her cardiologist to obtained a CT angiogram which showed no evidence of pulmonary embolism. She returns the ED because she was told to come back if symptoms were not getting any better. She states they're not better but they're not worse either. She does notice dyspnea on minimal exertion, and also orthopnea. She denies chest pain, heaviness, tightness, pressure. She has some mild nausea which is chronic. She denies diaphoresis. She denies palpitations.  Past Medical History  Diagnosis Date  . Hyperlipidemia   . Hypertension   . Hypothyroidism   . TIA (transient ischemic attack) 2001  . Carotid artery disease   . Shortness of breath   . CHF (congestive heart failure)    Past Surgical History  Procedure Laterality Date  . Cholecystectomy    . Abdominal hysterectomy  1975    TAH,BSO  . Carotid endarterectomy  2001    left  . Doppler echocardiography  02/13/2010    EF>55%  . Doppler echocardiography  09/29/2007  . Nm myoview ltd  02/13/2010  . Carotid duplex  11/24/2012  . Carotid duplex  11/01/2010  . Renal duplex  02/13/2010   No family history on file. History  Substance Use Topics  . Smoking status: Former Research scientist (life sciences)  . Smokeless tobacco: Not on file  . Alcohol Use: Yes   OB  History    No data available     Review of Systems  Respiratory: Positive for shortness of breath.   All other systems reviewed and are negative.     Allergies  Review of patient's allergies indicates no known allergies.  Home Medications   Prior to Admission medications   Medication Sig Start Date End Date Taking? Authorizing Provider  Calcium Carbonate (GNP CALCIUM) 1500 MG TABS Take 1,500 mg by mouth every evening.     Historical Provider, MD  Cholecalciferol 1000 UNITS tablet Take 1,000 Units by mouth every evening.     Historical Provider, MD  furosemide (LASIX) 20 MG tablet Take 2 tablets (40 mg total) by mouth daily. 07/22/15   Noemi Chapel, MD  levothyroxine (SYNTHROID, LEVOTHROID) 100 MCG tablet Take 100 mcg by mouth daily before breakfast.     Historical Provider, MD  lisinopril-hydrochlorothiazide (PRINZIDE,ZESTORETIC) 20-12.5 MG per tablet Take 1 tablet by mouth every evening.     Historical Provider, MD  magnesium 30 MG tablet Take 30 mg by mouth every evening.     Historical Provider, MD  Misc Natural Products (TURMERIC CURCUMIN) CAPS Take 1 capsule by mouth every evening.    Historical Provider, MD  niacin 500 MG tablet Take 500 mg by mouth every evening. otc    Historical Provider, MD  Potassium 99 MG TABS Take 99 mg by mouth every evening. otc    Historical Provider, MD  vitamin B-12 (  CYANOCOBALAMIN) 500 MCG tablet Take 500 mcg by mouth every evening.     Historical Provider, MD   Pulse 52  Temp(Src) 98.1 F (36.7 C) (Oral)  Resp 28  Wt 190 lb (86.183 kg)  SpO2 92% Physical Exam  Nursing note and vitals reviewed.  80 year old female, resting comfortably and in no acute distress. Vital signs are significant for bradycardia, and tachypnea. Oxygen saturation is 92%, which is normal. However, oxygen saturations 92% is with supper mental oxygen. At triage, she was reported to have oxygen saturation of 85% on room air, which is hypoxic. Head is normocephalic and  atraumatic. PERRLA, EOMI. Oropharynx is clear. Neck is nontender and supple without adenopathy. JVD is noted when sitting upright. Back is nontender and there is no CVA tenderness. Lungs are clear without rales, wheezes, or rhonchi. Chest is nontender. Heart has bradycardia with regular rate and rhythm without murmur. Abdomen is soft, flat, nontender without masses or hepatosplenomegaly and peristalsis is normoactive. Extremities have 2+ edema, full range of motion is present. There is no presacral edema. Skin is warm and dry without rash. Neurologic: Mental status is normal, cranial nerves are intact, there are no motor or sensory deficits.  ED Course  Procedures (including critical care time) Labs Review Results for orders placed or performed during the hospital encounter of 00/71/21  Basic metabolic panel  Result Value Ref Range   Sodium 131 (L) 135 - 145 mmol/L   Potassium 3.9 3.5 - 5.1 mmol/L   Chloride 95 (L) 101 - 111 mmol/L   CO2 24 22 - 32 mmol/L   Glucose, Bld 115 (H) 65 - 99 mg/dL   BUN 31 (H) 6 - 20 mg/dL   Creatinine, Ser 1.49 (H) 0.44 - 1.00 mg/dL   Calcium 9.2 8.9 - 10.3 mg/dL   GFR calc non Af Amer 33 (L) >60 mL/min   GFR calc Af Amer 38 (L) >60 mL/min   Anion gap 12 5 - 15  CBC  Result Value Ref Range   WBC 7.4 4.0 - 10.5 K/uL   RBC 3.38 (L) 3.87 - 5.11 MIL/uL   Hemoglobin 11.3 (L) 12.0 - 15.0 g/dL   HCT 34.0 (L) 36.0 - 46.0 %   MCV 100.6 (H) 78.0 - 100.0 fL   MCH 33.4 26.0 - 34.0 pg   MCHC 33.2 30.0 - 36.0 g/dL   RDW 13.2 11.5 - 15.5 %   Platelets 283 150 - 400 K/uL  Troponin I  Result Value Ref Range   Troponin I 0.04 (H) <0.031 ng/mL  Brain natriuretic peptide  Result Value Ref Range   B Natriuretic Peptide 346.3 (H) 0.0 - 100.0 pg/mL  Differential  Result Value Ref Range   Neutrophils Relative % PENDING 43 - 77 %   Neutro Abs PENDING 1.7 - 7.7 K/uL   Band Neutrophils PENDING 0 - 10 %   Lymphocytes Relative PENDING 12 - 46 %   Lymphs Abs PENDING  0.7 - 4.0 K/uL   Monocytes Relative PENDING 3 - 12 %   Monocytes Absolute PENDING 0.1 - 1.0 K/uL   Eosinophils Relative PENDING 0 - 5 %   Eosinophils Absolute PENDING 0.0 - 0.7 K/uL   Basophils Relative PENDING 0 - 1 %   Basophils Absolute PENDING 0.0 - 0.1 K/uL   WBC Morphology PENDING    RBC Morphology PENDING    Smear Review PENDING    nRBC PENDING 0 /100 WBC   Metamyelocytes Relative PENDING %   Myelocytes  PENDING %   Promyelocytes Absolute PENDING %   Blasts PENDING %  Urinalysis, Routine w reflex microscopic (not at [facility])  Result Value Ref Range   Color, Urine YELLOW YELLOW   APPearance CLEAR CLEAR   Specific Gravity, Urine 1.005 1.005 - 1.030   pH 5.0 5.0 - 8.0   Glucose, UA NEGATIVE NEGATIVE mg/dL   Hgb urine dipstick NEGATIVE NEGATIVE   Bilirubin Urine NEGATIVE NEGATIVE   Ketones, ur NEGATIVE NEGATIVE mg/dL   Protein, ur NEGATIVE NEGATIVE mg/dL   Urobilinogen, UA 0.2 0.0 - 1.0 mg/dL   Nitrite NEGATIVE NEGATIVE   Leukocytes, UA NEGATIVE NEGATIVE   Imaging Review Dg Chest Port 1 View  07/30/2015   CLINICAL DATA:  Patient with shortness of breath since last Saturday. Recent diagnosis congestive heart failure.  EXAM: PORTABLE CHEST - 1 VIEW  COMPARISON:  Chest CT 07/28/2015; chest radiograph 07/22/2015  FINDINGS: Monitoring leads overlie the patient. Cardiomegaly. Moderate left and small right pleural effusions. Bilateral perihilar interstitial pulmonary opacities. No pneumothorax.  IMPRESSION: Cardiomegaly with increasing interstitial opacities concerning for pulmonary edema. Moderate left and small right pleural effusions.   Electronically Signed   By: Lovey Newcomer M.D.   On: 07/30/2015 21:18   Images viewed by me.   EKG Interpretation   Date/Time:  Sunday July 30 2015 20:31:21 EDT Ventricular Rate:  52 PR Interval:    QRS Duration: 88 QT Interval:  444 QTC Calculation: 412 R Axis:   83 Text Interpretation:  Sinus bradycardia Low voltage QRS Cannot rule out   Anterior infarct , age undetermined Abnormal ECG When compared with ECG of  07/22/2015, HEART RATE has decreased Confirmed by [facility]  MD, Kenry Daubert ([id number])  on 07/30/2015 8:53:02 PM      MDM   Final diagnoses:  Acute exacerbation of CHF (congestive heart failure)  Acute kidney injury (nontraumatic)  Elevated troponin I level    Persistent dyspnea with worsening peripheral edema consistent with congestive heart failure. Reason for failure to respond to oral furosemide is unclear. Old records were reviewed confirming recent EEG visits and also recent cardiology office visit for dyspnea. CT angiogram obtained 2 days ago was consistent with congestive heart failure. She will be given intravenous furosemide in the ED and screening labs obtained. She may need to be admitted for failure to respond to outpatient management.  She did have some diuresis in response to furosemide. Laboratory workup shows mild elevation of BNP which is unchanged from one week ago. However, renal failure is significantly worse and this may account for her failure to respond to outpatient furosemide. Troponin has come back minimally elevated of uncertain significance. Without ECG changes and without chest pain, do not feel that she needs anticoagulation. Case is discussed with Dr. Arnoldo Morale [facility] who agrees to admit the patient.  Delora Fuel, MD 66/44/03 [id number]

## 2015-07-30 NOTE — ED Notes (Signed)
Bladder scan revealed 282 mL residual urine.

## 2015-07-30 NOTE — ED Notes (Signed)
The pt is c/o sob since last Saturday and was diagnosed with chf.  She saw her cards doctor on this past Friday and everything checked out.  She has also had trouble voiding since she was started on lasix.  Sob in triage

## 2015-07-30 NOTE — H&P (Signed)
[facility] Admission History and Physical       ZEHRA RUCCI EXN:[id number] DOB: [birth year] DOA: 07/30/2015  Referring physician: EDP PCP: Garnet Koyanagi, DO  Specialists:   Chief Complaint: Worsening SOB   HPI: Colleen [name] is a 80 y.o. female with recent diagnosis of Diastolic CHF who presents to the ED with complaints of worsening SOB, DOE, Orthopnea and Edema of both lower legs over a 1 week period.   She had been seen by her PCP and Cardiologist and sent for a CTA of the Chest  And 2D ECHO on 08/05 which were negative  For findings of  A Pulmonary embolism but revealed congestive hear Failure and  An EF > 55%.   She was placed on Lasix 40 mg daily for 5 days but reports that she had little response to the treatment.   She continued to worsen so she came to the ED.   She denies any chest pain or fevers or chills.      Review of Systems:  Constitutional: No Weight Loss, No Weight Gain, Night Sweats, Fevers, Chills, Dizziness, Light Headedness, Fatigue, or Generalized Weakness HEENT: No Headaches, Difficulty Swallowing,Tooth/Dental Problems,Sore Throat,  No Sneezing, Rhinitis, Ear Ache, Nasal Congestion, or Post Nasal Drip,  Cardio-vascular:  No Chest pain,+ Orthopnea, PND, +Edema in Lower Extremities, Anasarca, Dizziness, Palpitations  Resp:  +Dyspnea,  +DOE, No Productive Cough, No Non-Productive Cough, No Hemoptysis, No Wheezing.    GI: No Heartburn, Indigestion, Abdominal Pain, Nausea, Vomiting, Diarrhea, Constipation, Hematemesis, Hematochezia, Melena, Change in Bowel Habits,  Loss of Appetite  GU: No Dysuria, No Change in Color of Urine, No Urgency or Urinary Frequency, No Flank pain.  Musculoskeletal: No Joint Pain or Swelling, No Decreased Range of Motion, No Back Pain.  Neurologic: No Syncope, No Seizures, Muscle Weakness, Paresthesia, Vision Disturbance or Loss, No Diplopia, No Vertigo, No Difficulty Walking,  Skin: No Rash or Lesions. Psych: No Change in Mood or  Affect, No Depression or Anxiety, No Memory loss, No Confusion, or Hallucinations   Past Medical History  Diagnosis Date  . Hyperlipidemia   . Hypertension   . Hypothyroidism   . TIA (transient ischemic attack) 2001  . Carotid artery disease   . Shortness of breath   . CHF (congestive heart failure)      Past Surgical History  Procedure Laterality Date  . Cholecystectomy    . Abdominal hysterectomy  1975    TAH,BSO  . Carotid endarterectomy  2001    left  . Doppler echocardiography  02/13/2010    EF>55%  . Doppler echocardiography  09/29/2007  . Nm myoview ltd  02/13/2010  . Carotid duplex  11/24/2012  . Carotid duplex  11/01/2010  . Renal duplex  02/13/2010      Prior to Admission medications   Medication Sig Start Date End Date Taking? Authorizing Provider  Calcium Carbonate (GNP CALCIUM) 1500 MG TABS Take 1,500 mg by mouth every evening.     Historical Provider, MD  Cholecalciferol 1000 UNITS tablet Take 1,000 Units by mouth every evening.     Historical Provider, MD  furosemide (LASIX) 20 MG tablet Take 2 tablets (40 mg total) by mouth daily. 07/22/15   Noemi Chapel, MD  levothyroxine (SYNTHROID, LEVOTHROID) 100 MCG tablet Take 100 mcg by mouth daily before breakfast.     Historical Provider, MD  lisinopril-hydrochlorothiazide (PRINZIDE,ZESTORETIC) 20-12.5 MG per tablet Take 1 tablet by mouth every evening.     Historical Provider, MD  magnesium 30 MG  tablet Take 30 mg by mouth every evening.     Historical Provider, MD  Misc Natural Products (TURMERIC CURCUMIN) CAPS Take 1 capsule by mouth every evening.    Historical Provider, MD  niacin 500 MG tablet Take 500 mg by mouth every evening. otc    Historical Provider, MD  Potassium 99 MG TABS Take 99 mg by mouth every evening. otc    Historical Provider, MD  vitamin B-12 (CYANOCOBALAMIN) 500 MCG tablet Take 500 mcg by mouth every evening.     Historical Provider, MD     No Known Allergies  Social History:  reports that  she has quit smoking. She does not have any smokeless tobacco history on file. She reports that she drinks alcohol. She reports that she does not use illicit drugs.    No family history on file.     Physical Exam:  GEN:  Pleasant Obese Elderly  80 y.o. Caucasian female examined and in no acute distress; cooperative with exam Filed Vitals:   07/30/15 2200 07/30/15 2215 07/30/15 2230 07/30/15 2245  BP: 135/53 135/53 136/66 142/54  Pulse: 41 44 46 44  Temp:      TempSrc:      Resp: 18 21 19 18   Weight:      SpO2: 95% 94% 94% 95%   Blood pressure 142/54, pulse 44, temperature 98.1 F (36.7 C), temperature source Oral, resp. rate 18, weight 86.183 kg (190 lb), SpO2 95 %. PSYCH: She is alert and oriented x4; does not appear anxious does not appear depressed; affect is normal HEENT: Normocephalic and Atraumatic, Mucous membranes pink; PERRLA; EOM intact; Fundi:  Benign;  No scleral icterus, Nares: Patent, Oropharynx: Clear, Edentulous with Dentures Present,    Neck:  FROM, No Cervical Lymphadenopathy nor Thyromegaly or Carotid Bruit; No JVD; Breasts:: Not examined CHEST WALL: No tenderness CHEST:    Bibasilar Rales, Normal respiration, No Rhonchi or Wheezes HEART: Regular rate and rhythm; no murmurs rubs or gallops BACK: No kyphosis or scoliosis; No CVA tenderness ABDOMEN: Positive Bowel Sounds, Obese, Soft Non-Tender, No Rebound or Guarding; No Masses, No Organomegaly. Rectal Exam: Not done EXTREMITIES: No Cyanosis, Clubbing, 1+ BLE Edema; No Ulcerations. Genitalia: not examined PULSES: 2+ and symmetric SKIN: Normal hydration no rash or ulceration CNS:  Alert and Oriented x 4, No Focal Deficits Vascular: pulses palpable throughout    Labs on Admission:  Basic Metabolic Panel:  Recent Labs Lab 07/30/15 2050  NA 131*  K 3.9  CL 95*  CO2 24  GLUCOSE 115*  BUN 31*  CREATININE 1.49*  CALCIUM 9.2   Liver Function Tests: No results for input(s): AST, ALT, ALKPHOS, BILITOT,  PROT, ALBUMIN in the last 168 hours. No results for input(s): LIPASE, AMYLASE in the last 168 hours. No results for input(s): AMMONIA in the last 168 hours. CBC:  Recent Labs Lab 07/30/15 2050  WBC 7.4  NEUTROABS PENDING  HGB 11.3*  HCT 34.0*  MCV 100.6*  PLT 283   Cardiac Enzymes:  Recent Labs Lab 07/30/15 2050  TROPONINI 0.04*    BNP (last 3 results)  Recent Labs  07/22/15 1715 07/30/15 2050  BNP 394.9* 346.3*    ProBNP (last 3 results) No results for input(s): PROBNP in the last 8760 hours.  CBG: No results for input(s): GLUCAP in the last 168 hours.  Radiological Exams on Admission: Dg Chest Port 1 View  07/30/2015   CLINICAL DATA:  Patient with shortness of breath since last Saturday. Recent diagnosis congestive heart  failure.  EXAM: PORTABLE CHEST - 1 VIEW  COMPARISON:  Chest CT 07/28/2015; chest radiograph 07/22/2015  FINDINGS: Monitoring leads overlie the patient. Cardiomegaly. Moderate left and small right pleural effusions. Bilateral perihilar interstitial pulmonary opacities. No pneumothorax.  IMPRESSION: Cardiomegaly with increasing interstitial opacities concerning for pulmonary edema. Moderate left and small right pleural effusions.   Electronically Signed   By: Lovey Newcomer M.D.   On: 07/30/2015 21:18     EKG: Independently reviewed. Sinus Bradycardia rate = 52   Assessment/Plan:     80 y.o. female with  Principal Problem:   1.     Acute diastolic CHF (congestive heart failure)   Acute CHF Protocol -diurese with Lasix 80 mg IV q 12 hrs x 4 doses   Strict  I/Os, and Daily Weights   2D ECHO done on 07/28/2015   EF > 55%   No Beta blocker  Due to Bradycardia   No ACE/ARB  Rx due to Renal Insufficiency      Active Problems:   2.    Acute respiratory failure with hypoxia   O2 PRN   Monitor O2 sats     3.    Sinus bradycardia   Cardiac monitoring     4.    Hyponatremia- due to fluid Overload   Monitor Na+ Trend     5.    AKI (acute kidney  injury)- due to #1   Monitor BUN/Cr   Hold ACE Rx        6.    Essential hypertension   On Lasix IV     7.    Hyperlipidemia- Hypertriglyceridemia   On Niacin rx     8.    Hypothyroidism   Continue Levothyroxine Rx   Check TSH     9.   DVT Prophylaxis   Lovenox    Code Status:     FULL CODE        Family Communication:   Husband at Bedside  Disposition Plan:    Inpatient  Status        Time spent:  [facility] Pager [phone]   If [place] Please Contact the Day Rounding Team MD for [facility]  If 7PM-7AM, Please Contact Night-Floor Coverage  [email or web address] Password [email or web address] 07/30/2015, 11:36 PM     ADDENDUM:   Patient was seen and examined on 07/30/2015

## 2015-07-30 NOTE — ED Notes (Signed)
No pain

## 2015-07-31 ENCOUNTER — Ambulatory Visit: Payer: Medicare Other | Admitting: Cardiovascular Disease

## 2015-07-31 DIAGNOSIS — R001 Bradycardia, unspecified: Secondary | ICD-10-CM

## 2015-07-31 DIAGNOSIS — I5031 Acute diastolic (congestive) heart failure: Secondary | ICD-10-CM

## 2015-07-31 DIAGNOSIS — N179 Acute kidney failure, unspecified: Secondary | ICD-10-CM

## 2015-07-31 LAB — TROPONIN I
TROPONIN I: 0.04 ng/mL — AB (ref <0.031)
Troponin I: 0.03 ng/mL (ref <0.031)
Troponin I: 0.04 ng/mL — ABNORMAL HIGH (ref <0.031)

## 2015-07-31 LAB — BASIC METABOLIC PANEL
Anion gap: 9 (ref 5–15)
BUN: 31 mg/dL — ABNORMAL HIGH (ref 6–20)
CALCIUM: 8.9 mg/dL (ref 8.9–10.3)
CHLORIDE: 96 mmol/L — AB (ref 101–111)
CO2: 26 mmol/L (ref 22–32)
CREATININE: 1.52 mg/dL — AB (ref 0.44–1.00)
GFR calc Af Amer: 37 mL/min — ABNORMAL LOW (ref >60)
GFR, EST NON AFRICAN AMERICAN: 32 mL/min — AB (ref >60)
GLUCOSE: 101 mg/dL — AB (ref 65–99)
Potassium: 3.8 mmol/L (ref 3.5–5.1)
SODIUM: 131 mmol/L — AB (ref 135–145)

## 2015-07-31 LAB — TSH: TSH: 3.342 u[IU]/mL (ref 0.350–4.500)

## 2015-07-31 LAB — MAGNESIUM: Magnesium: 1.9 mg/dL (ref 1.7–2.4)

## 2015-07-31 MED ORDER — MAGNESIUM 30 MG PO TABS: 30.0000 mg | ORAL_TABLET | Freq: Every evening | ORAL | Status: DC

## 2015-07-31 MED ORDER — LEVOTHYROXINE SODIUM 100 MCG PO TABS
100.0000 ug | ORAL_TABLET | Freq: Every day | ORAL | Status: DC
  Administered 2015-07-31 – 2015-08-16 (×17): 100 ug via ORAL
  Filled 2015-07-31 (×22): qty 1

## 2015-07-31 MED ORDER — VITAMIN D 1000 UNITS PO TABS
1000.0000 [IU] | ORAL_TABLET | Freq: Every evening | ORAL | Status: DC
  Administered 2015-07-31: 1000 [IU] via ORAL
  Filled 2015-07-31 (×8): qty 1

## 2015-07-31 MED ORDER — NIACIN 500 MG PO TABS
500.0000 mg | ORAL_TABLET | Freq: Every evening | ORAL | Status: DC
  Administered 2015-07-31: 500 mg via ORAL
  Filled 2015-07-31 (×2): qty 1

## 2015-07-31 MED ORDER — CALCIUM CARBONATE 1250 (500 CA) MG PO TABS
1250.0000 mg | ORAL_TABLET | Freq: Every evening | ORAL | Status: DC
  Administered 2015-07-31: 1250 mg via ORAL
  Filled 2015-07-31 (×8): qty 1

## 2015-07-31 MED ORDER — CYANOCOBALAMIN 500 MCG PO TABS
500.0000 ug | ORAL_TABLET | Freq: Every evening | ORAL | Status: DC
  Administered 2015-07-31: 500 ug via ORAL
  Filled 2015-07-31 (×8): qty 1

## 2015-07-31 MED ORDER — ACETAMINOPHEN 325 MG PO TABS
650.0000 mg | ORAL_TABLET | ORAL | Status: DC | PRN
  Filled 2015-07-31: qty 2

## 2015-07-31 MED ORDER — POTASSIUM CHLORIDE CRYS ER 10 MEQ PO TBCR
10.0000 meq | EXTENDED_RELEASE_TABLET | Freq: Two times a day (BID) | ORAL | Status: DC
  Administered 2015-07-31 (×2): 10 meq via ORAL
  Filled 2015-07-31 (×4): qty 1

## 2015-07-31 MED ORDER — PNEUMOCOCCAL VAC POLYVALENT 25 MCG/0.5ML IJ INJ
0.5000 mL | INJECTION | INTRAMUSCULAR | Status: AC
  Administered 2015-08-01: 0.5 mL via INTRAMUSCULAR
  Filled 2015-07-31: qty 0.5

## 2015-07-31 MED ORDER — FUROSEMIDE 10 MG/ML IJ SOLN
80.0000 mg | Freq: Two times a day (BID) | INTRAMUSCULAR | Status: AC
  Administered 2015-07-31 – 2015-08-01 (×4): 80 mg via INTRAVENOUS
  Filled 2015-07-31 (×4): qty 8

## 2015-07-31 MED ORDER — ENOXAPARIN SODIUM 30 MG/0.3ML ~~LOC~~ SOLN
30.0000 mg | SUBCUTANEOUS | Status: DC
  Administered 2015-07-31 – 2015-08-03 (×4): 30 mg via SUBCUTANEOUS
  Filled 2015-07-31 (×4): qty 0.3

## 2015-07-31 MED ORDER — SODIUM CHLORIDE 0.9 % IJ SOLN
3.0000 mL | Freq: Two times a day (BID) | INTRAMUSCULAR | Status: DC
  Administered 2015-07-31 – 2015-08-14 (×27): 3 mL via INTRAVENOUS

## 2015-07-31 MED ORDER — SODIUM CHLORIDE 0.9 % IV SOLN
250.0000 mL | INTRAVENOUS | Status: DC | PRN
  Administered 2015-08-09: 250 mL via INTRAVENOUS

## 2015-07-31 MED ORDER — SODIUM CHLORIDE 0.9 % IJ SOLN
3.0000 mL | INTRAMUSCULAR | Status: DC | PRN
  Administered 2015-08-11: 3 mL via INTRAVENOUS
  Filled 2015-07-31: qty 3

## 2015-07-31 MED ORDER — ONDANSETRON HCL 4 MG/2ML IJ SOLN: 4.0000 mg | Freq: Four times a day (QID) | INTRAMUSCULAR | Status: DC | PRN

## 2015-07-31 NOTE — Progress Notes (Signed)
[facility] PROGRESS NOTE  Colleen [name] VOJ:[id number] DOB: 1935/11/03 DOA: 07/30/2015 PCP: Garnet Koyanagi, DO  Assessment/Plan: Acute diastolic CHF (congestive heart failure): Daily weight:  Continue with IV lasix.  Cardiology consulted.  2D ECHO done on 07/28/2015 EF > 55% Urine out put 1.1 L.   Acute respiratory failure with hypoxia In setting of HF.  Continue with lasix.   Sinus bradycardia Cardiology consulted, will this be contributing to HF, dyspnea.  BB stopped by Primary cardiology on 8-05 clinic visit.   AKI (acute kidney injury)- due to #1 Hold ACE Rx Had to had in and out last night. If continue to have retention will place foley.  Repeat urine culture. Urine from 8-5 growing multiple bacterial morphotype.   Hypothyroidism Continue with synthroid.   Hyponatremia; related to hypervolemia in setting HF.  On IV lasix.   Mildly elevated troponin; in setting of HF.  Cardiology following.   Code Status: Full Code.  Family Communication: care discussed with patient.  Disposition Plan: remain inpatient.    Consultants: cardiology   Procedures:  none  Antibiotics:  none  HPI/Subjective: Still with SOB. She denies dizziness. Denies chest pain.   Objective: Filed Vitals:   07/31/15 0552  BP: 123/49  Pulse: 43  Temp: 97.8 F (36.6 C)  Resp: 20    Intake/Output Summary (Last 24 hours) at 07/31/15 1110 Last data filed at 07/31/15 1036  Gross per 24 hour  Intake    360 ml  Output    995 ml  Net   -635 ml   Filed Weights   07/30/15 2033 07/31/15 0222  Weight: 86.183 kg (190 lb) 85.412 kg (188 lb 4.8 oz)    Exam:   General:  Alert mild dyspnea.   Cardiovascular: S 1, S 2 RRR  Respiratory: Bilateral crackles.   Abdomen: BS present, soft, nt  Musculoskeletal: trace edema.    Data Reviewed: Basic Metabolic Panel:  Recent Labs Lab 07/30/15 2050 07/31/15 0454 07/31/15 1012  NA 131* 131*  --   K 3.9 3.8  --   CL 95* 96*   --   CO2 24 26  --   GLUCOSE 115* 101*  --   BUN 31* 31*  --   CREATININE 1.49* 1.52*  --   CALCIUM 9.2 8.9  --   MG  --   --  1.9   Liver Function Tests: No results for input(s): AST, ALT, ALKPHOS, BILITOT, PROT, ALBUMIN in the last 168 hours. No results for input(s): LIPASE, AMYLASE in the last 168 hours. No results for input(s): AMMONIA in the last 168 hours. CBC:  Recent Labs Lab 07/30/15 2050  WBC 7.4  NEUTROABS PENDING  HGB 11.3*  HCT 34.0*  MCV 100.6*  PLT 283   Cardiac Enzymes:  Recent Labs Lab 07/30/15 2050 07/31/15 1012  TROPONINI 0.04* 0.04*   BNP (last 3 results)  Recent Labs  07/22/15 1715 07/30/15 2050  BNP 394.9* 346.3*    ProBNP (last 3 results) No results for input(s): PROBNP in the last 8760 hours.  CBG: No results for input(s): GLUCAP in the last 168 hours.  Recent Results (from the past 240 hour(s))  Urine Culture     Status: None   Collection Time: 07/28/15  5:48 PM  Result Value Ref Range Status   Colony Count >=100,000 COLONIES/ML  Final   Organism ID, Bacteria Multiple bacterial morphotypes present, none  Final   Organism ID, Bacteria predominant. Suggest appropriate recollection if   Final  Organism ID, Bacteria clinically indicated.  Final     Studies: Dg Chest Port 1 View  07/30/2015   CLINICAL DATA:  Patient with shortness of breath since last Saturday. Recent diagnosis congestive heart failure.  EXAM: PORTABLE CHEST - 1 VIEW  COMPARISON:  Chest CT 07/28/2015; chest radiograph 07/22/2015  FINDINGS: Monitoring leads overlie the patient. Cardiomegaly. Moderate left and small right pleural effusions. Bilateral perihilar interstitial pulmonary opacities. No pneumothorax.  IMPRESSION: Cardiomegaly with increasing interstitial opacities concerning for pulmonary edema. Moderate left and small right pleural effusions.   Electronically Signed   By: Lovey Newcomer M.D.   On: 07/30/2015 21:18    Scheduled Meds: . calcium carbonate  1,250 mg  Oral QPM  . cholecalciferol  1,000 Units Oral QPM  . cyanocobalamin  500 mcg Oral QPM  . enoxaparin (LOVENOX) injection  30 mg Subcutaneous Q24H  . furosemide  80 mg Intravenous Q12H  . levothyroxine  100 mcg Oral QAC breakfast  . niacin  500 mg Oral QPM  . [START ON 08/01/2015] pneumococcal 23 valent vaccine  0.5 mL Intramuscular Tomorrow-1000  . potassium chloride  10 mEq Oral BID  . sodium chloride  3 mL Intravenous Q12H   Continuous Infusions:   Principal Problem:   Acute diastolic CHF (congestive heart failure) Active Problems:   Hypothyroidism   Hyperlipidemia   Essential hypertension   Acute respiratory failure with hypoxia   AKI (acute kidney injury)   Sinus bradycardia   Hyponatremia    Time spent: 30 minutes.     Niel Hummer A  [facility] Pager [phone]. If 7PM-7AM, please contact night-coverage at [email or web address], password [facility] 07/31/2015, 11:10 AM  LOS: 1 day

## 2015-07-31 NOTE — Consult Note (Signed)
CARDIOLOGY CONSULT NOTE      Patient ID: Colleen [name] MRN: [id number] DOB/AGE: November 14, 1935 80 y.o.  Admit date: 07/30/2015 Referring PhysicianBelkys Desiree Lucy, MD Primary Seymour, DO Primary Cardiologist: Dr. Gwenlyn Found Reason for Consultation: CHF/ Bradycardia  HPI: Colleen [name] is a 80 yo female with PMHx of diastolic CHF (echo 06/24/52 EF 65-70%), HTN, Hypothyroidism, and sinus bradycardia who  presented to the ED on 07/30/15 with complaint of DOE, weight gain, orthopnea and increased edema. Patient's BNP was elevated at 346.3 (394 on 7/30) and CXR showed cardiomegaly with increased interstitial opacities concerning for pulmonary edema. There was also a moderate left and small right pleural effusions. Weight on admission was 189 (baseline weight 176-178 per patient). Patient was admitted for a CHF exacerbation.   Patient was recently seen in the ED for similar complaints of 07/22/15. Patient followed up with Dr. Gwenlyn Found in the clinic on 8/5. At that time, beta blocker was discontinued due to bradycardia and Mobitz Type II Heart Block. CTA was performed to r/o PE which was negative. Lasix 40 mg daily was initiated and lisinopril-HCTZ 20-12.5 mg daily was continued.   Patient was given lasix 40 mg IV in the ED and started on lasix 80 mg IV BID. Patient was seen and examined this morning. She continues to have shortness of breath, orthopnea and DOE but states the swelling in her legs have gone down some. She denies any chest pain, lightheadedness or syncope.   Review of systems complete and found to be negative unless listed above   Past Medical History  Diagnosis Date  . Hyperlipidemia   . Hypertension   . Hypothyroidism   . TIA (transient ischemic attack) 2001  . Carotid artery disease   . Shortness of breath   . CHF (congestive heart failure)     No family history on file.  History   Social History  . Marital Status: Married    Spouse Name: N/A  . Number of Children: N/A  .  Years of Education: N/A   Occupational History  . Not on file.   Social History Main Topics  . Smoking status: Former Research scientist (life sciences)  . Smokeless tobacco: Not on file  . Alcohol Use: Yes  . Drug Use: No  . Sexual Activity: Not on file   Other Topics Concern  . Not on file   Social History Narrative    Past Surgical History  Procedure Laterality Date  . Cholecystectomy    . Abdominal hysterectomy  1975    TAH,BSO  . Carotid endarterectomy  2001    left  . Doppler echocardiography  02/13/2010    EF>55%  . Doppler echocardiography  09/29/2007  . Nm myoview ltd  02/13/2010  . Carotid duplex  11/24/2012  . Carotid duplex  11/01/2010  . Renal duplex  02/13/2010     Prescriptions prior to admission  Medication Sig Dispense Refill Last Dose  . Calcium Carbonate (GNP CALCIUM) 1500 MG TABS Take 1,500 mg by mouth every evening.    Past Month at Unknown time  . Cholecalciferol 1000 UNITS tablet Take 1,000 Units by mouth every evening.    Past Month at Unknown time  . levothyroxine (SYNTHROID, LEVOTHROID) 100 MCG tablet Take 100 mcg by mouth daily before breakfast.    07/30/2015 at Unknown time  . lisinopril-hydrochlorothiazide (PRINZIDE,ZESTORETIC) 20-12.5 MG per tablet Take 1 tablet by mouth every evening.    07/29/2015  . magnesium 30 MG tablet Take 30 mg by mouth every evening.  Past Month at Unknown time  . Misc Natural Products (TURMERIC CURCUMIN) CAPS Take 1 capsule by mouth every evening.   Past Month at Unknown time  . niacin 500 MG tablet Take 500 mg by mouth every evening. otc   Past Month at Unknown time  . Potassium 99 MG TABS Take 99 mg by mouth every evening. otc   Past Month at Unknown time  . vitamin B-12 (CYANOCOBALAMIN) 500 MCG tablet Take 500 mcg by mouth every evening.    Past Month at Unknown time    Physical Exam: Filed Vitals:   07/31/15 0130 07/31/15 0145 07/31/15 0222 07/31/15 0552  BP: 129/53 121/51 150/52 123/49  Pulse: 43 42 43 43  Temp:   97.7 F (36.5 C) 97.8  F (36.6 C)  TempSrc:   Oral Oral  Resp: 14 16 21 20  Height:   5' 5" (1.651 m)   Weight:   188 lb 4.8 oz (85.412 kg)   SpO2: 96% 95% 93% 94%   General: Vital signs reviewed.  Patient is well-developed and well-nourished, in no acute distress and cooperative with exam.  Neck: + JVD, no carotid bruit present.  Cardiovascular: Bradycardic, S1 normal, S2 normal. Pulmonary/Chest: Mild inspiratory crackles in lower lung fields, no wheezes, or rhonchi. Abdominal: Soft, non-tender, non-distended, BS + Extremities: 1+ pitting edema in ankles bilaterally, pulses symmetric and intact bilaterally.  Neurological: A&O x3 Psychiatric: Normal mood and affect. speech and behavior is normal. Cognition and memory are normal.   I&O's:    Intake/Output Summary (Last 24 hours) at 07/31/15 1032 Last data filed at 07/31/15 1000  Gross per 24 hour  Intake    360 ml  Output    895 ml  Net   -535 ml   Labs:   Lab Results  Component Value Date   WBC 7.4 07/30/2015   HGB 11.3* 07/30/2015   HCT 34.0* 07/30/2015   MCV 100.6* 07/30/2015   PLT 283 07/30/2015     Recent Labs Lab 07/31/15 0454  NA 131*  K 3.8  CL 96*  CO2 26  BUN 31*  CREATININE 1.52*  CALCIUM 8.9  GLUCOSE 101*   Lab Results  Component Value Date   TROPONINI 0.04* 07/30/2015    Lab Results  Component Value Date   CHOL 148 02/21/2015   CHOL 192 07/03/2007   CHOL 246* 02/27/2007   Lab Results  Component Value Date   HDL 47 02/21/2015   HDL 60.4 07/03/2007   HDL 61 02/27/2007   Lab Results  Component Value Date   LDLCALC 83 02/21/2015   LDLCALC 107* 07/03/2007   LDLCALC 139* 02/27/2007   Lab Results  Component Value Date   TRIG 90 02/21/2015   TRIG 124 07/03/2007   TRIG 229* 02/27/2007   Lab Results  Component Value Date   CHOLHDL 3.1 02/21/2015   CHOLHDL 3.2 CALC 07/03/2007   CHOLHDL 4.0 Ratio 02/27/2007   EKG: Narrow Complex Bradycardia  ASSESSMENT AND PLAN:  Principal Problem:   Acute diastolic  CHF (congestive heart failure) Active Problems:   Hypothyroidism   Hyperlipidemia   Essential hypertension   Acute respiratory failure with hypoxia   AKI (acute kidney injury)   Sinus bradycardia   Hyponatremia  Diastolic CHF Exacerbation: Echo on 07/28/15 showed EF of 65-70%. BNP elevated at 346.3. CXR showed cardiomegaly, increased interstitial opacities concerning for pulmonary edema, and pleural effusions. Patient was given lasix 40 mg IV in the ED and started on lasix 80 mg IV   BID. Urine output since admission is -775 for total net of -415. ACEI is being held in the setting of AKI. No beta blocker secondary to bradycardia. Recommend continuing IV diuresis and following I/Os and daily weights.  -Continue Lasix 80 mg IV BID -Repeat BMET tomorrow am -Strict I/Os -Daily weights -Check TSH  Acute Kidney Injury: Baseline creatinine is 0.8-1.0. Creatinine on admission 1.49>1.52 this morning.  -Hold lisinopril -Repeat BMET tomorrow am  Narrow Complex Bradycardia: Likely Junctional Bradycardia. Previous diagnosis of Mobitz Type II. Beta blocker was discontinued 1-2 weeks ago. EKG shows sinus bradycardia with HR 52. Patient denies any lightheadedness, dizziness or syncopal episodes. Once patient is adequately diuresed, patient should be ambulated with monitoring of her heart rate. If patient's heart rate fails to increase with exertion, she develops symptomatic bradycardia, or heart rates begin to shift into the 30s-40s, pacemaker placement should be considered.  -Telemetry -Ambulate after diuresis with heart rate monitoring -Consider EP evaluation if criteria above is met  HTN: Well controlled 123/49 this morning.  -Continue to hold lisinopril -Avoid beta blockers or other rate slowing drugs. -Lasix 80 mg IV BID   Signed: Osa Craver, DO PGY-2 Internal Medicine Resident Pager # [phone] 07/31/2015 10:32 AM   I have examined the patient and reviewed assessment and plan and discussed  with patient.  Agree with above as stated.  D/w Dr. Marvel Plan. Continue diuresis as noted above. Episodic junctional heart rhythm. Will need to check for chronotropic competence. This may be contributing to her dyspnea on exertion as well. Watch for any other symptomatic bradycardia. Would need to consider EP consultation if she has further issues with bradycardia and this is contributing to her heart failure.  She has been off of atenolol for a week.    Danen Lapaglia S.

## 2015-07-31 NOTE — ED Notes (Signed)
Pt expelled 200 mL of urine. Appearance clear, straw yellow.

## 2015-07-31 NOTE — ED Notes (Signed)
Unsure on belongings - can go hoe with husband who is at the bedside.

## 2015-07-31 NOTE — Progress Notes (Signed)
Pt reports having hot flushes with her niacin, pt prefers flush free niacin or she don't want med at all. Reported off to oncoming RN. Francis Gaines Roddie Riegler RN.

## 2015-08-01 DIAGNOSIS — J9601 Acute respiratory failure with hypoxia: Secondary | ICD-10-CM

## 2015-08-01 DIAGNOSIS — I5033 Acute on chronic diastolic (congestive) heart failure: Secondary | ICD-10-CM | POA: Diagnosis not present

## 2015-08-01 DIAGNOSIS — I10 Essential (primary) hypertension: Secondary | ICD-10-CM

## 2015-08-01 LAB — BASIC METABOLIC PANEL
ANION GAP: 8 (ref 5–15)
BUN: 32 mg/dL — ABNORMAL HIGH (ref 6–20)
CHLORIDE: 96 mmol/L — AB (ref 101–111)
CO2: 28 mmol/L (ref 22–32)
Calcium: 9.4 mg/dL (ref 8.9–10.3)
Creatinine, Ser: 1.34 mg/dL — ABNORMAL HIGH (ref 0.44–1.00)
GFR calc Af Amer: 43 mL/min — ABNORMAL LOW (ref >60)
GFR calc non Af Amer: 37 mL/min — ABNORMAL LOW (ref >60)
Glucose, Bld: 113 mg/dL — ABNORMAL HIGH (ref 65–99)
POTASSIUM: 3.8 mmol/L (ref 3.5–5.1)
Sodium: 132 mmol/L — ABNORMAL LOW (ref 135–145)

## 2015-08-01 MED ORDER — POTASSIUM CHLORIDE CRYS ER 20 MEQ PO TBCR
20.0000 meq | EXTENDED_RELEASE_TABLET | Freq: Two times a day (BID) | ORAL | Status: DC
  Administered 2015-08-01 – 2015-08-07 (×14): 20 meq via ORAL
  Filled 2015-08-01 (×19): qty 1

## 2015-08-01 MED ORDER — METOLAZONE 2.5 MG PO TABS
2.5000 mg | ORAL_TABLET | Freq: Every day | ORAL | Status: DC
  Administered 2015-08-01 – 2015-08-09 (×9): 2.5 mg via ORAL
  Filled 2015-08-01 (×13): qty 1

## 2015-08-01 MED ORDER — METOLAZONE 2.5 MG PO TABS
2.5000 mg | ORAL_TABLET | Freq: Every day | ORAL | Status: DC
  Filled 2015-08-01 (×2): qty 1

## 2015-08-01 NOTE — Progress Notes (Signed)
Bladder scanned patient post void. Pt had post void residual of 243 mL. She urinated approximately 200 mL prior to bladder scan. Patient states she has difficulty with initiating urine stream. She was able to return to [facility] 10-15 minutes later to empty remainder of her bladder.

## 2015-08-01 NOTE — Progress Notes (Addendum)
[facility] PROGRESS NOTE  Colleen [name] LKG:[id number] DOB: October 09, 1935 DOA: 07/30/2015 PCP: Garnet Koyanagi, DO  Assessment/Plan: Colleen [name] is a 80 y.o. female with recent diagnosis of Diastolic CHF who presents to the ED with complaints of worsening SOB, DOE, Orthopnea and Edema of both lower legs over a 1 week period. She had been seen by her PCP and Cardiologist and sent for a CTA of the Chest And 2D ECHO on 08/05 which were negative For findings of A Pulmonary embolism but revealed congestive hear Failure and An EF > 55%. She was placed on Lasix 40 mg daily for 5 days but reports that she had little response to the treatment. She continued to worsen so she came to the ED 8-7. She denies any chest pain or fevers or chills.  Admitted for Heart Failure exacerbation.   Acute diastolic CHF (congestive heart failure): Daily weight: 86---85 Continue with IV lasix.  Cardiology consulted and following/  2D ECHO done on 07/28/2015 EF > 55% Urine out put 2.2 L. On 808 Started on metolazone 2.5 mg daily on 8-9  Acute respiratory failure with hypoxia In setting of HF.  Continue with lasix.   Sinus bradycardia Cardiology consulted, will this be contributing to HF, dyspnea.  BB stopped by Primary cardiology on 8-05 clinic visit.  Asymptomatic   AKI (acute kidney injury)- due to #1 Hold ACE Rx Had to had in and out during this admission.  If continue to have retention will place foley.  Repeat urine culture. Urine from 8-5 growing multiple bacterial morphotype.  Cr trending down.   Hypothyroidism Continue with synthroid.   Hyponatremia; related to hypervolemia in setting HF.  On IV lasix.   Mildly elevated troponin; in setting of HF.  Cardiology following.   Code Status: Full Code.  Family Communication: care discussed with patient.  Disposition Plan: remain inpatient for diuresis.     Consultants: cardiology    Procedures:  none  Antibiotics:  none  HPI/Subjective: Feeling better today, but still with SOB, not at baseline. She is on 3 L oxygen. She does not use oxygen at home/   Objective: Filed Vitals:   08/01/15 1427  BP: 141/58  Pulse: 53  Temp: 98.2 F (36.8 C)  Resp: 18    Intake/Output Summary (Last 24 hours) at 08/01/15 1555 Last data filed at 08/01/15 1520  Gross per 24 hour  Intake   1400 ml  Output   2520 ml  Net  -1120 ml   Filed Weights   07/30/15 2033 07/31/15 0222 08/01/15 0500  Weight: 86.183 kg (190 lb) 85.412 kg (188 lb 4.8 oz) 85.14 kg (187 lb 11.2 oz)    Exam:   General:  Alert no acute distress.   Cardiovascular: S 1, S 2 RRR  Respiratory: Bilateral crackles.   Abdomen: BS present, soft, nt  Musculoskeletal: trace edema.    Data Reviewed: Basic Metabolic Panel:  Recent Labs Lab 07/30/15 2050 07/31/15 0454 07/31/15 1012 08/01/15 0524  NA 131* 131*  --  132*  K 3.9 3.8  --  3.8  CL 95* 96*  --  96*  CO2 24 26  --  28  GLUCOSE 115* 101*  --  113*  BUN 31* 31*  --  32*  CREATININE 1.49* 1.52*  --  1.34*  CALCIUM 9.2 8.9  --  9.4  MG  --   --  1.9  --    Liver Function Tests: No results for input(s): AST, ALT, ALKPHOS,  BILITOT, PROT, ALBUMIN in the last 168 hours. No results for input(s): LIPASE, AMYLASE in the last 168 hours. No results for input(s): AMMONIA in the last 168 hours. CBC:  Recent Labs Lab 07/30/15 2050  WBC 7.4  NEUTROABS PENDING  HGB 11.3*  HCT 34.0*  MCV 100.6*  PLT 283   Cardiac Enzymes:  Recent Labs Lab 07/30/15 2050 07/31/15 1012 07/31/15 1735 07/31/15 2256  TROPONINI 0.04* 0.04* 0.03 0.04*   BNP (last 3 results)  Recent Labs  07/22/15 1715 07/30/15 2050  BNP 394.9* 346.3*    ProBNP (last 3 results) No results for input(s): PROBNP in the last 8760 hours.  CBG: No results for input(s): GLUCAP in the last 168 hours.  Recent Results (from the past 240 hour(s))  Urine Culture      Status: None   Collection Time: 07/28/15  5:48 PM  Result Value Ref Range Status   Colony Count >=100,000 COLONIES/ML  Final   Organism ID, Bacteria Multiple bacterial morphotypes present, none  Final   Organism ID, Bacteria predominant. Suggest appropriate recollection if   Final   Organism ID, Bacteria clinically indicated.  Final     Studies: Dg Chest Port 1 View  07/30/2015   CLINICAL DATA:  Patient with shortness of breath since last Saturday. Recent diagnosis congestive heart failure.  EXAM: PORTABLE CHEST - 1 VIEW  COMPARISON:  Chest CT 07/28/2015; chest radiograph 07/22/2015  FINDINGS: Monitoring leads overlie the patient. Cardiomegaly. Moderate left and small right pleural effusions. Bilateral perihilar interstitial pulmonary opacities. No pneumothorax.  IMPRESSION: Cardiomegaly with increasing interstitial opacities concerning for pulmonary edema. Moderate left and small right pleural effusions.   Electronically Signed   By: Lovey Newcomer M.D.   On: 07/30/2015 21:18    Scheduled Meds: . calcium carbonate  1,250 mg Oral QPM  . cholecalciferol  1,000 Units Oral QPM  . cyanocobalamin  500 mcg Oral QPM  . enoxaparin (LOVENOX) injection  30 mg Subcutaneous Q24H  . furosemide  80 mg Intravenous Q12H  . levothyroxine  100 mcg Oral QAC breakfast  . metolazone  2.5 mg Oral Daily  . potassium chloride  20 mEq Oral BID  . sodium chloride  3 mL Intravenous Q12H   Continuous Infusions:   Principal Problem:   Acute diastolic CHF (congestive heart failure) Active Problems:   Hypothyroidism   Hyperlipidemia   Essential hypertension   Acute respiratory failure with hypoxia   AKI (acute kidney injury)   Sinus bradycardia   Hyponatremia    Time spent: 30 minutes.     Colleen [name] A  [facility] Pager [phone]. If 7PM-7AM, please contact night-coverage at [email or web address], password [facility] 08/01/2015, 3:55 PM  LOS: 2 days

## 2015-08-01 NOTE — Progress Notes (Signed)
Patient Name: Colleen [name] Date of Encounter: 08/01/2015  Principal Problem:   Acute diastolic CHF (congestive heart failure) Active Problems:   Hypothyroidism   Hyperlipidemia   Essential hypertension   Acute respiratory failure with hypoxia   AKI (acute kidney injury)   Sinus bradycardia   Hyponatremia   Primary Cardiologist: Dr Gwenlyn Found  Patient Profile: 80 yo female with PMHx of diastolic CHF (echo 05/28/05 EF 65-70%), HTN, Hypothyroidism, and sinus bradycardia, admitted 08/08 w/ SOB  SUBJECTIVE: Breathing a little better today. This has been coming on for a while. She was in the ER 07/30 with volume overload, and IV Lasix helped but she became more SOB since then. She was drinking more water, trying to increase her urination.  OBJECTIVE Filed Vitals:   07/31/15 1430 07/31/15 2045 07/31/15 2153 08/01/15 0500  BP: 132/59 159/58 154/65 155/60  Pulse: 50 53 60 56  Temp: 98.2 F (36.8 C) 98.2 F (36.8 C) 98.1 F (36.7 C) 98.4 F (36.9 C)  TempSrc: Oral Oral Oral Oral  Resp: 18 22 22 18   Height:      Weight:    187 lb 11.2 oz (85.14 kg)  SpO2: 95% 91% 94% 95%    Intake/Output Summary (Last 24 hours) at 08/01/15 0806 Last data filed at 08/01/15 0626  Gross per 24 hour  Intake   1320 ml  Output   2240 ml  Net   -920 ml   Filed Weights   07/30/15 2033 07/31/15 0222 08/01/15 0500  Weight: 190 lb (86.183 kg) 188 lb 4.8 oz (85.412 kg) 187 lb 11.2 oz (85.14 kg)    PHYSICAL EXAM General: Well developed, well nourished, female in no acute distress. Head: Normocephalic, atraumatic.  Neck: Supple without bruits, JVD 9 cm Lungs:  Resp regular and unlabored, decreased BS bases bilaterally w/ rales. Heart: RRR, S1, S2, no S3, S4, or murmur; no rub. Abdomen: Soft, non-tender, non-distended, BS + x 4.  Extremities: No clubbing, cyanosis, edema.  Neuro: Alert and oriented X 3. Moves all extremities spontaneously. Psych: Normal affect.  LABS: CBC: Recent Labs   07/30/15 2050  WBC 7.4  NEUTROABS PENDING  HGB 11.3*  HCT 34.0*  MCV 100.6*  PLT 301   Basic Metabolic Panel: Recent Labs  07/31/15 0454 07/31/15 1012 08/01/15 0524  NA 131*  --  132*  K 3.8  --  3.8  CL 96*  --  96*  CO2 26  --  28  GLUCOSE 101*  --  113*  BUN 31*  --  32*  CREATININE 1.52*  --  1.34*  CALCIUM 8.9  --  9.4  MG  --  1.9  --    Cardiac Enzymes: Recent Labs  07/31/15 1012 07/31/15 1735 07/31/15 2256  TROPONINI 0.04* 0.03 0.04*   BNP:  B NATRIURETIC PEPTIDE  Date/Time Value Ref Range Status  07/30/2015 08:50 PM 346.3* 0.0 - 100.0 pg/mL Final  07/22/2015 05:15 PM 394.9* 0.0 - 100.0 pg/mL Final   Thyroid Function Tests: Recent Labs  07/31/15 1012  TSH 3.342   TELE:  SR, sinus brady, 40s at times      Radiology/Studies: Dg Chest Port 1 View 07/30/2015   CLINICAL DATA:  Patient with shortness of breath since last Saturday. Recent diagnosis congestive heart failure.  EXAM: PORTABLE CHEST - 1 VIEW  COMPARISON:  Chest CT 07/28/2015; chest radiograph 07/22/2015  FINDINGS: Monitoring leads overlie the patient. Cardiomegaly. Moderate left and small right pleural effusions. Bilateral perihilar interstitial  pulmonary opacities. No pneumothorax.  IMPRESSION: Cardiomegaly with increasing interstitial opacities concerning for pulmonary edema. Moderate left and small right pleural effusions.   Electronically Signed   By: Lovey Newcomer M.D.   On: 07/30/2015 21:18   Ct Angio Chest Pe W/cm &/or Wo Cm 07/28/2015   CLINICAL DATA:  Shortness breath for 2 weeks. Personal history of smoking. The patient quit smoking 15 years ago.  EXAM: CT ANGIOGRAPHY CHEST WITH CONTRAST  TECHNIQUE: Multidetector CT imaging of the chest was performed using the standard protocol during bolus administration of intravenous contrast. Multiplanar CT image reconstructions and MIPs were obtained to evaluate the vascular anatomy.  CONTRAST:  132mL OMNIPAQUE IOHEXOL 350 MG/ML SOLN  COMPARISON:  Two-view  chest x-ray 07/22/2015.  FINDINGS: Pulmonary arterial opacification is satisfactory. The study is mildly degraded by patient breathing motion. No focal filling defects are evident to suggest pulmonary emboli.  The heart is mildly enlarged. Coronary artery calcifications are present. There is no significant pericardial effusion.  A precarinal lymph node measures 1.2 cm in short axis. Subcentimeter prevascular lymph nodes are present. No other pathologically enlarged nodes are present. No significant axillary nodes are present.  Small moderate bilateral pleural effusions are present. There is some associated atelectasis.  Diffuse ground-glass attenuation is present throughout both lungs, likely representing edema. No significant airspace consolidation is present.  The bone windows demonstrate exaggerated kyphosis in the lower thoracic spine. No focal lytic or blastic lesions are present.  Review of the MIP images confirms the above findings.  IMPRESSION: 1. No evidence for pulmonary embolus. 2. Cardiomegaly, interstitial edema, and bilateral pleural effusions are compatible with congestive heart failure. 3. No focal airspace opacification other than dependent atelectasis. 4. Enlarged precarinal lymph node. Other smaller mediastinal nodes are present as well. These are likely reactive in the setting of congestive heart failure. These results will be called to the ordering clinician or representative by the Radiologist Assistant, and communication documented in the PACS or zVision Dashboard.   Electronically Signed   By: San Morelle M.D.   On: 07/28/2015 11:15    Current Medications:  . calcium carbonate  1,250 mg Oral QPM  . cholecalciferol  1,000 Units Oral QPM  . cyanocobalamin  500 mcg Oral QPM  . enoxaparin (LOVENOX) injection  30 mg Subcutaneous Q24H  . furosemide  80 mg Intravenous Q12H  . levothyroxine  100 mcg Oral QAC breakfast  . pneumococcal 23 valent vaccine  0.5 mL Intramuscular  Tomorrow-1000  . potassium chloride  10 mEq Oral BID  . sodium chloride  3 mL Intravenous Q12H      ASSESSMENT AND PLAN: Principal Problem:   Acute diastolic CHF (congestive heart failure) - diuresing some but Lasix dose is high, add metolazone for a short course to enhance diuresis - she may need PRN metolazone as OP - says oral Lasix not effective PTA, may need to switch to torsemide as OP - continue to follow weights and I/O, renal function - Increase K+ supp  Otherwise, per IM; Renal function improved from admission, but worse than 07/30 Active Problems:   Hypothyroidism   Hyperlipidemia   Essential hypertension   Acute respiratory failure with hypoxia   AKI (acute kidney injury)   Sinus bradycardia   Hyponatremia   Signed, Barrett, Rhonda , PA-C 8:06 AM 08/01/2015  Agree with note by Rosaria Ferries PA-C  Appears more comfortable. Diuresing on IV lasix. I/O neg. No CP. Labs OK. Scr improved. Exam benign. Lungs clear. Still has 1-2+  pitting ankle edema. Agree with adding metolazone to enhance diuresis for now.  Lorretta Harp, M.D., [place], [facility], [place] [address] [place], [place]  [id number]  [phone] 08/01/2015 8:50 AM

## 2015-08-02 DIAGNOSIS — E871 Hypo-osmolality and hyponatremia: Secondary | ICD-10-CM

## 2015-08-02 DIAGNOSIS — N179 Acute kidney failure, unspecified: Secondary | ICD-10-CM | POA: Insufficient documentation

## 2015-08-02 LAB — BASIC METABOLIC PANEL
ANION GAP: 12 (ref 5–15)
BUN: 27 mg/dL — ABNORMAL HIGH (ref 6–20)
CALCIUM: 9.4 mg/dL (ref 8.9–10.3)
CO2: 30 mmol/L (ref 22–32)
Chloride: 93 mmol/L — ABNORMAL LOW (ref 101–111)
Creatinine, Ser: 1.15 mg/dL — ABNORMAL HIGH (ref 0.44–1.00)
GFR calc Af Amer: 52 mL/min — ABNORMAL LOW (ref >60)
GFR, EST NON AFRICAN AMERICAN: 45 mL/min — AB (ref >60)
Glucose, Bld: 124 mg/dL — ABNORMAL HIGH (ref 65–99)
Potassium: 3.7 mmol/L (ref 3.5–5.1)
Sodium: 135 mmol/L (ref 135–145)

## 2015-08-02 LAB — CBC
HCT: 32.3 % — ABNORMAL LOW (ref 36.0–46.0)
HEMOGLOBIN: 10.5 g/dL — AB (ref 12.0–15.0)
MCH: 33.1 pg (ref 26.0–34.0)
MCHC: 32.5 g/dL (ref 30.0–36.0)
MCV: 101.9 fL — ABNORMAL HIGH (ref 78.0–100.0)
Platelets: 234 10*3/uL (ref 150–400)
RBC: 3.17 MIL/uL — AB (ref 3.87–5.11)
RDW: 13.3 % (ref 11.5–15.5)
WBC: 8.5 10*3/uL (ref 4.0–10.5)

## 2015-08-02 LAB — MAGNESIUM: Magnesium: 1.8 mg/dL (ref 1.7–2.4)

## 2015-08-02 MED ORDER — POTASSIUM CHLORIDE CRYS ER 20 MEQ PO TBCR
40.0000 meq | EXTENDED_RELEASE_TABLET | Freq: Once | ORAL | Status: AC
  Administered 2015-08-02: 40 meq via ORAL

## 2015-08-02 MED ORDER — FUROSEMIDE 10 MG/ML IJ SOLN
80.0000 mg | Freq: Two times a day (BID) | INTRAMUSCULAR | Status: DC
  Administered 2015-08-02 – 2015-08-07 (×12): 80 mg via INTRAVENOUS
  Filled 2015-08-02 (×15): qty 8

## 2015-08-02 NOTE — Progress Notes (Signed)
PATIENT DETAILS Name: Colleen [name] Age: 80 y.o. Sex: female Date of Birth: 12-01-1935 Admit Date: 07/30/2015 Admitting Physician Theressa Millard, MD TTS:[id number] [name], DO  Subjective: Shortness of breath has significantly improved, significant improvement in lower extremity edema.  Assessment/Plan: Principal Problem: Acute diastolic CHF: Improved with IV Lasix and metolazone, weight decreased to 182 pounds (190 on admit), negative balance of 3.8 L. Continue diuretics and follow closely. Appreciate cardiology follow-up  Active Problems: Acute hypoxic respiratory failure: Secondary to above. Titrate down oxygen as tolerated.  Sinus bradycardia: Heart rate much better, continued to monitor off beta blockers. Reviewed most recent outpatient cardiology note-patient had a history of junctional bradycardia, she apparently also had a history of Mobitz type II AV block.  ARF: Suspect secondary to CHF/diuretics. Creatinine improving, follow closely.  Hyponatremia: Likely secondary to hypervolemia, resolved with IV Lasix.  Hypertension: Continue to hold lisinopril/HCTZ-blood pressure controlled with IV Lasix. Follow  Hypothyroidism: Continue with Synthroid  Hyperlipidemia: Resume niacin on discharge  Disposition: Remain inpatient-home the next 2-3 days  Antimicrobial agents  See below  Anti-infectives    None      DVT Prophylaxis: Prophylactic Lovenox   Code Status: Full code   Family Communication None at bedside  Procedures: None  CONSULTS:  cardiology  Time spent 30 minutes-Greater than 50% of this time was spent in counseling, explanation of diagnosis, planning of further management, and coordination of care.  MEDICATIONS: Scheduled Meds: . calcium carbonate  1,250 mg Oral QPM  . cholecalciferol  1,000 Units Oral QPM  . cyanocobalamin  500 mcg Oral QPM  . enoxaparin (LOVENOX) injection  30 mg Subcutaneous Q24H  . furosemide  80 mg  Intravenous Q12H  . levothyroxine  100 mcg Oral QAC breakfast  . metolazone  2.5 mg Oral Daily  . potassium chloride  20 mEq Oral BID  . sodium chloride  3 mL Intravenous Q12H   Continuous Infusions:  PRN Meds:.sodium chloride, acetaminophen, ondansetron (ZOFRAN) IV, sodium chloride    PHYSICAL EXAM: Vital signs in last 24 hours: Filed Vitals:   08/01/15 2002 08/02/15 0311 08/02/15 0800 08/02/15 1023  BP: 147/54 163/62 128/51   Pulse: 56 64 56   Temp: 98.7 F (37.1 C) 98.1 F (36.7 C) 97.8 F (36.6 C)   TempSrc: Oral Oral Oral   Resp: 21 21 18    Height:      Weight:  82.736 kg (182 lb 6.4 oz)    SpO2: 95% 98% 97% 94%    Weight change: -2.404 kg (-5 lb 4.8 oz) Filed Weights   07/31/15 0222 08/01/15 0500 08/02/15 0311  Weight: 85.412 kg (188 lb 4.8 oz) 85.14 kg (187 lb 11.2 oz) 82.736 kg (182 lb 6.4 oz)   Body mass index is 30.35 kg/(m^2).   Gen Exam: Awake and alert with clear speech.   Neck: Supple, No JVD.   Chest: Few bibasilar rales CVS: S1 S2 Regular, no murmurs.  Abdomen: soft, BS +, non tender, non distended.  Extremities: + edema, lower extremities warm to touch. Neurologic: Non Focal.  Skin: No Rash.   Wounds: N/A.    Intake/Output from previous day:  Intake/Output Summary (Last 24 hours) at 08/02/15 1245 Last data filed at 08/02/15 1125  Gross per 24 hour  Intake   1220 ml  Output   2500 ml  Net  -1280 ml     LAB RESULTS: CBC  Recent Labs Lab  07/30/15 2050 08/02/15 0352  WBC 7.4 8.5  HGB 11.3* 10.5*  HCT 34.0* 32.3*  PLT 283 234  MCV 100.6* 101.9*  MCH 33.4 33.1  MCHC 33.2 32.5  RDW 13.2 13.3  LYMPHSABS PENDING  --   MONOABS PENDING  --   EOSABS PENDING  --   BASOSABS PENDING  --     Chemistries   Recent Labs Lab 07/30/15 2050 07/31/15 0454 07/31/15 1012 08/01/15 0524 08/02/15 0352  NA 131* 131*  --  132* 135  K 3.9 3.8  --  3.8 3.7  CL 95* 96*  --  96* 93*  CO2 24 26  --  28 30  GLUCOSE 115* 101*  --  113* 124*  BUN  31* 31*  --  32* 27*  CREATININE 1.49* 1.52*  --  1.34* 1.15*  CALCIUM 9.2 8.9  --  9.4 9.4  MG  --   --  1.9  --  1.8    CBG: No results for input(s): GLUCAP in the last 168 hours.  GFR Estimated Creatinine Clearance: 43.5 mL/min (by C-G formula based on Cr of 1.15).  Coagulation profile No results for input(s): INR, PROTIME in the last 168 hours.  Cardiac Enzymes  Recent Labs Lab 07/31/15 1012 07/31/15 1735 07/31/15 2256  TROPONINI 0.04* 0.03 0.04*    Invalid input(s): POCBNP No results for input(s): DDIMER in the last 72 hours. No results for input(s): HGBA1C in the last 72 hours. No results for input(s): CHOL, HDL, LDLCALC, TRIG, CHOLHDL, LDLDIRECT in the last 72 hours.  Recent Labs  07/31/15 1012  TSH 3.342   No results for input(s): VITAMINB12, FOLATE, FERRITIN, TIBC, IRON, RETICCTPCT in the last 72 hours. No results for input(s): LIPASE, AMYLASE in the last 72 hours.  Urine Studies No results for input(s): UHGB, CRYS in the last 72 hours.  Invalid input(s): UACOL, UAPR, USPG, UPH, UTP, UGL, UKET, UBIL, UNIT, UROB, ULEU, UEPI, UWBC, URBC, UBAC, CAST, UCOM, BILUA  MICROBIOLOGY: Recent Results (from the past 240 hour(s))  Urine Culture     Status: None   Collection Time: 07/28/15  5:48 PM  Result Value Ref Range Status   Colony Count >=100,000 COLONIES/ML  Final   Organism ID, Bacteria Multiple bacterial morphotypes present, none  Final   Organism ID, Bacteria predominant. Suggest appropriate recollection if   Final   Organism ID, Bacteria clinically indicated.  Final  Urine culture     Status: None (Preliminary result)   Collection Time: 08/01/15  4:31 PM  Result Value Ref Range Status   Specimen Description URINE, CLEAN CATCH  Final   Special Requests NONE  Final   Culture CULTURE REINCUBATED FOR BETTER GROWTH  Final   Report Status PENDING  Incomplete    RADIOLOGY STUDIES/RESULTS: Dg Chest 2 View  07/22/2015   CLINICAL DATA:  Shortness of breath,  dry cough.  EXAM: CHEST  2 VIEW  COMPARISON:  02/20/2015  FINDINGS: There is mild cardiomegaly and vascular congestion. No overt edema. No confluent opacities or effusions. No acute bony abnormality.  IMPRESSION: Cardiomegaly, vascular congestion.   Electronically Signed   By: Rolm Baptise M.D.   On: 07/22/2015 18:48   Ct Angio Chest Pe W/cm &/or Wo Cm  07/28/2015   CLINICAL DATA:  Shortness breath for 2 weeks. Personal history of smoking. The patient quit smoking 15 years ago.  EXAM: CT ANGIOGRAPHY CHEST WITH CONTRAST  TECHNIQUE: Multidetector CT imaging of the chest was performed using the standard protocol during  bolus administration of intravenous contrast. Multiplanar CT image reconstructions and MIPs were obtained to evaluate the vascular anatomy.  CONTRAST:  190mL OMNIPAQUE IOHEXOL 350 MG/ML SOLN  COMPARISON:  Two-view chest x-ray 07/22/2015.  FINDINGS: Pulmonary arterial opacification is satisfactory. The study is mildly degraded by patient breathing motion. No focal filling defects are evident to suggest pulmonary emboli.  The heart is mildly enlarged. Coronary artery calcifications are present. There is no significant pericardial effusion.  A precarinal lymph node measures 1.2 cm in short axis. Subcentimeter prevascular lymph nodes are present. No other pathologically enlarged nodes are present. No significant axillary nodes are present.  Small moderate bilateral pleural effusions are present. There is some associated atelectasis.  Diffuse ground-glass attenuation is present throughout both lungs, likely representing edema. No significant airspace consolidation is present.  The bone windows demonstrate exaggerated kyphosis in the lower thoracic spine. No focal lytic or blastic lesions are present.  Review of the MIP images confirms the above findings.  IMPRESSION: 1. No evidence for pulmonary embolus. 2. Cardiomegaly, interstitial edema, and bilateral pleural effusions are compatible with congestive  heart failure. 3. No focal airspace opacification other than dependent atelectasis. 4. Enlarged precarinal lymph node. Other smaller mediastinal nodes are present as well. These are likely reactive in the setting of congestive heart failure. These results will be called to the ordering clinician or representative by the Radiologist Assistant, and communication documented in the PACS or zVision Dashboard.   Electronically Signed   By: San Morelle M.D.   On: 07/28/2015 11:15   Dg Chest Port 1 View  07/30/2015   CLINICAL DATA:  Patient with shortness of breath since last Saturday. Recent diagnosis congestive heart failure.  EXAM: PORTABLE CHEST - 1 VIEW  COMPARISON:  Chest CT 07/28/2015; chest radiograph 07/22/2015  FINDINGS: Monitoring leads overlie the patient. Cardiomegaly. Moderate left and small right pleural effusions. Bilateral perihilar interstitial pulmonary opacities. No pneumothorax.  IMPRESSION: Cardiomegaly with increasing interstitial opacities concerning for pulmonary edema. Moderate left and small right pleural effusions.   Electronically Signed   By: Lovey Newcomer M.D.   On: 07/30/2015 21:18    Oren Binet, MD  [facility] Pager:[phone]  If 7PM-7AM, please contact night-coverage [email or web address] Password [email or web address] 08/02/2015, 12:45 PM   LOS: 3 days

## 2015-08-02 NOTE — Care Management Important Message (Signed)
Important Message  Patient Details  Name: Colleen [name] MRN: [id number] Date of Birth: 13-Jan-1935   Medicare Important Message Given:  Yes-second notification given    Pricilla Handler 08/02/2015, 10:02 AM

## 2015-08-02 NOTE — Evaluation (Signed)
Physical Therapy Evaluation Patient Details Name: Colleen [name] MRN: [id number] DOB: 12-Oct-1935 Today's Date: 08/02/2015   History of Present Illness  80 y.o. female with recent diagnosis of Diastolic CHF who presents to the ED with complaints of worsening SOB, DOE, Orthopnea and Edema of both lower legs over a 1 week period.  Clinical Impression  Pt admitted with above diagnosis. Pt currently with functional limitations due to the deficits listed below (see PT Problem List). Pt will benefit from skilled PT to increase their independence and safety with mobility to allow discharge to the venue listed below.  Pt presents with decreased activity tolerance with functional mobility.  Educated on pursed lip breathing.  She would benefit from acute PT for gait and stair training.  Do not feel she will need any post acute PT.     Follow Up Recommendations No PT follow up    Equipment Recommendations  None recommended by PT    Recommendations for Other Services       Precautions / Restrictions Precautions Precautions: Other (comment) Precaution Comments: monitor o2      Mobility  Bed Mobility Overal bed mobility: Modified Independent                Transfers Overall transfer level: Modified independent               General transfer comment: Pt MOD I with transfers from bed and BSC  Ambulation/Gait Ambulation/Gait assistance: Supervision Ambulation Distance (Feet): 120 Feet Assistive device: None Gait Pattern/deviations: Step-through pattern     General Gait Details: Amb with step through pattern on 2/4 dyspnea on 3L/min o2 in high 90's.  Decreased o2 to 2L and o2 90-91%.  Stairs            Wheelchair Mobility    Modified Rankin (Stroke Patients Only)       Balance Overall balance assessment: No apparent balance deficits (not formally assessed)                                           Pertinent Vitals/Pain Pain Assessment:  No/denies pain    Home Living Family/patient expects to be discharged to:: Private residence Living Arrangements: Spouse/significant other;Children Available Help at Discharge: Family;Available 24 hours/day Type of Home: House Home Access: Stairs to enter Entrance Stairs-Rails: None Entrance Stairs-Number of Steps: 3 Home Layout: Multi-level Home Equipment: Walker - 2 wheels;Cane - single point      Prior Function Level of Independence: Independent         Comments: occasional use of cane due to L knee pain     Hand Dominance        Extremity/Trunk Assessment   Upper Extremity Assessment: Overall WFL for tasks assessed           Lower Extremity Assessment: Overall WFL for tasks assessed      Cervical / Trunk Assessment: Normal  Communication   Communication: HOH  Cognition Arousal/Alertness: Awake/alert Behavior During Therapy: WFL for tasks assessed/performed Overall Cognitive Status: Within Functional Limits for tasks assessed                      General Comments General comments (skin integrity, edema, etc.): At rest on 2 L/min 02 sats 90-91%.  Educated on pursed lip breathing.    Exercises        Assessment/Plan  PT Assessment Patient needs continued PT services  PT Diagnosis Difficulty walking   PT Problem List Cardiopulmonary status limiting activity;Decreased mobility;Decreased balance;Decreased activity tolerance  PT Treatment Interventions Functional mobility training;Therapeutic activities;Therapeutic exercise;Gait training;Stair training   PT Goals (Current goals can be found in the Care Plan section) Acute Rehab PT Goals Patient Stated Goal: Get stronger PT Goal Formulation: With patient Time For Goal Achievement: 08/16/15 Potential to Achieve Goals: Good    Frequency Min 3X/week   Barriers to discharge        Co-evaluation               End of Session Equipment Utilized During Treatment: Gait  belt;Oxygen Activity Tolerance: Patient tolerated treatment well Patient left: in chair;with call bell/phone within reach;with family/visitor present Nurse Communication: Mobility status         Time: 1962-2297 PT Time Calculation (min) (ACUTE ONLY): 19 min   Charges:   PT Evaluation $Initial PT Evaluation Tier I: 1 Procedure     PT G Codes:        Adewale Pucillo LUBECK 08/02/2015, 12:42 PM

## 2015-08-02 NOTE — Progress Notes (Signed)
Patient Name: Colleen [name] Date of Encounter: 08/02/2015  Principal Problem:   Acute diastolic CHF (congestive heart failure) Active Problems:   Hypothyroidism   Hyperlipidemia   Essential hypertension   Acute respiratory failure with hypoxia   AKI (acute kidney injury)   Sinus bradycardia   Hyponatremia   Primary Cardiologist: Dr Gwenlyn Found  Patient Profile: 80 yo female with PMHx of diastolic CHF (echo 07/30/55 EF 65-70%), HTN, Hypothyroidism, and sinus bradycardia, admitted 08/08 w/ SOB  SUBJECTIVE: SOB has started to improve. Her dry weight is 175-176 lbs. Not on O2 at home.  OBJECTIVE Filed Vitals:   08/01/15 1427 08/01/15 2002 08/02/15 0311 08/02/15 0800  BP: 141/58 147/54 163/62 128/51  Pulse: 53 56 64 56  Temp: 98.2 F (36.8 C) 98.7 F (37.1 C) 98.1 F (36.7 C) 97.8 F (36.6 C)  TempSrc: Oral Oral Oral Oral  Resp: 18 21 21 18   Height:      Weight:   182 lb 6.4 oz (82.736 kg)   SpO2: 95% 95% 98% 97%    Intake/Output Summary (Last 24 hours) at 08/02/15 0853 Last data filed at 08/02/15 0800  Gross per 24 hour  Intake   1220 ml  Output   3075 ml  Net  -1855 ml   Filed Weights   07/31/15 0222 08/01/15 0500 08/02/15 0311  Weight: 188 lb 4.8 oz (85.412 kg) 187 lb 11.2 oz (85.14 kg) 182 lb 6.4 oz (82.736 kg)    PHYSICAL EXAM General: Well developed, well nourished, female in no acute distress. Head: Normocephalic, atraumatic.  Neck: Supple without bruits, JVD 9 cm. Lungs:  Resp regular and unlabored, decreased BS bases w/ rales. Heart: RRR, S1, S2, no S3, S4, or murmur; no rub. Abdomen: Soft, non-tender, non-distended, BS + x 4.  Extremities: No clubbing, cyanosis, edema.  Neuro: Alert and oriented X 3. Moves all extremities spontaneously. Psych: Normal affect.  LABS: CBC: Recent Labs  07/30/15 2050 08/02/15 0352  WBC 7.4 8.5  NEUTROABS PENDING  --   HGB 11.3* 10.5*  HCT 34.0* 32.3*  MCV 100.6* 101.9*  PLT 283 433   Basic Metabolic  Panel: Recent Labs  07/31/15 1012 08/01/15 0524 08/02/15 0352  NA  --  132* 135  K  --  3.8 3.7  CL  --  96* 93*  CO2  --  28 30  GLUCOSE  --  113* 124*  BUN  --  32* 27*  CREATININE  --  1.34* 1.15*  CALCIUM  --  9.4 9.4  MG 1.9  --  1.8   Cardiac Enzymes: Recent Labs  07/31/15 1012 07/31/15 1735 07/31/15 2256  TROPONINI 0.04* 0.03 0.04*   BNP:  B NATRIURETIC PEPTIDE  Date/Time Value Ref Range Status  07/30/2015 08:50 PM 346.3* 0.0 - 100.0 pg/mL Final  07/22/2015 05:15 PM 394.9* 0.0 - 100.0 pg/mL Final   Thyroid Function Tests: Recent Labs  07/31/15 1012  TSH 3.342   TELE:  Junctional rhythm, 6 bt run NSVT      Current Medications:  . calcium carbonate  1,250 mg Oral QPM  . cholecalciferol  1,000 Units Oral QPM  . cyanocobalamin  500 mcg Oral QPM  . enoxaparin (LOVENOX) injection  30 mg Subcutaneous Q24H  . levothyroxine  100 mcg Oral QAC breakfast  . metolazone  2.5 mg Oral Daily  . potassium chloride  20 mEq Oral BID  . sodium chloride  3 mL Intravenous Q12H      ASSESSMENT  AND PLAN: Principal Problem:  Acute diastolic CHF (congestive heart failure) - diuresing now that metolazone added (08/09) to enhance diuresis  - I/O neg > 2 L overnight - she may need PRN metolazone as OP - says oral Lasix not effective PTA, may need to switch to torsemide as OP - continue to follow weights and I/O, renal function - Increase K+ supp today only     Junctional Bradycardia - no rate-lowering rx - TSH OK - May need to assess for chronotropic incompetence, as this could contribute to DOE - increase activity as tolerated and follow HR with this    NSVT - asymptomatic - will give extra K+ today - ck Mg in am  Otherwise, per IM; Renal function improved to 07/30 values, worse than 03/01 Active Problems:  Hypothyroidism  Hyperlipidemia  Essential hypertension  Acute respiratory failure with hypoxia  AKI (acute kidney injury)  Sinus bradycardia   Hyponatremia  Signed, Barrett, [address] 8:53 AM 08/02/2015   Agree with note by Rosaria Ferries PA-C  Clinically improving with IV lasix and metolazone. 2L neg yesterday. Less SOB. Lungs clear SCr improved. Junct brady not on neg chronotropes. I would cont IV lasix today and tomorrow, transition to POP torsemide on Friday. Prob home Sat.  Lorretta Harp, M.D., [place], [facility], [place] [address] [place], [place]  [address]  [phone] 08/02/2015 9:37 AM

## 2015-08-02 NOTE — Research (Signed)
ReDS @ Discharge Informed Consent   Subject Name: Colleen [name]  Subject met inclusion and exclusion criteria.  The informed consent form, study requirements and expectations were reviewed with the subject and questions and concerns were addressed prior to the signing of the consent form.  The subject verbalized understanding of the trail requirements.  The subject agreed to participate in the ReDS @ Discharge study and signed the informed consent on 08/01/2015 at 1600.  The informed consent was obtained prior to performance of any protocol-specific procedures for the subject.  A copy of the signed informed consent was given to the subject and a copy was placed in the subject's medical record.  Blossom Hoops 08/02/2015, 8:52 AM

## 2015-08-03 LAB — BASIC METABOLIC PANEL
Anion gap: 11 (ref 5–15)
BUN: 28 mg/dL — ABNORMAL HIGH (ref 6–20)
CHLORIDE: 88 mmol/L — AB (ref 101–111)
CO2: 36 mmol/L — ABNORMAL HIGH (ref 22–32)
Calcium: 9.4 mg/dL (ref 8.9–10.3)
Creatinine, Ser: 1.23 mg/dL — ABNORMAL HIGH (ref 0.44–1.00)
GFR calc Af Amer: 48 mL/min — ABNORMAL LOW (ref >60)
GFR calc non Af Amer: 41 mL/min — ABNORMAL LOW (ref >60)
Glucose, Bld: 112 mg/dL — ABNORMAL HIGH (ref 65–99)
Potassium: 4.2 mmol/L (ref 3.5–5.1)
Sodium: 135 mmol/L (ref 135–145)

## 2015-08-03 LAB — URINE CULTURE

## 2015-08-03 LAB — MAGNESIUM: Magnesium: 1.9 mg/dL (ref 1.7–2.4)

## 2015-08-03 MED ORDER — CALCIUM CARBONATE ANTACID 500 MG PO CHEW
400.0000 mg | CHEWABLE_TABLET | Freq: Three times a day (TID) | ORAL | Status: DC | PRN
  Administered 2015-08-03: 400 mg via ORAL
  Filled 2015-08-03 (×2): qty 2

## 2015-08-03 MED ORDER — ENOXAPARIN SODIUM 40 MG/0.4ML ~~LOC~~ SOLN
40.0000 mg | SUBCUTANEOUS | Status: DC
  Administered 2015-08-04 – 2015-08-09 (×6): 40 mg via SUBCUTANEOUS
  Filled 2015-08-03 (×9): qty 0.4

## 2015-08-03 NOTE — Progress Notes (Signed)
Subjective:  Feeling much better, still on O2  Objective:  Temp:  [97.8 F (36.6 C)-98.7 F (37.1 C)] 97.8 F (36.6 C) (08/11 0643) Pulse Rate:  [54-64] 64 (08/11 0643) Resp:  [14-18] 18 (08/11 0643) BP: (129-140)/(44-52) 129/51 mmHg (08/11 0643) SpO2:  [93 %-99 %] 99 % (08/11 0643) Weight:  [181 lb (82.101 kg)] 181 lb (82.101 kg) (08/11 0643) Weight change: -1 lb 6.4 oz (-0.635 kg)  Intake/Output from previous day: 08/10 0701 - 08/11 0700 In: 1170 [P.O.:1170] Out: 2075 [Urine:2075]  Intake/Output from this shift:    Physical Exam: General appearance: alert and no distress Neck: no adenopathy, no carotid bruit, no JVD, supple, symmetrical, trachea midline and thyroid not enlarged, symmetric, no tenderness/mass/nodules Lungs: clear to auscultation bilaterally Heart: regular rate and rhythm, S1, S2 normal, no murmur, click, rub or gallop Extremities: extremities normal, atraumatic, no cyanosis or edema  Lab Results: Results for orders placed or performed during the hospital encounter of 07/30/15 (from the past 48 hour(s))  Urine culture     Status: None (Preliminary result)   Collection Time: 08/01/15  4:31 PM  Result Value Ref Range   Specimen Description URINE, CLEAN CATCH    Special Requests NONE    Culture CULTURE REINCUBATED FOR BETTER GROWTH    Report Status PENDING   Basic metabolic panel     Status: Abnormal   Collection Time: 08/02/15  3:52 AM  Result Value Ref Range   Sodium 135 135 - 145 mmol/L   Potassium 3.7 3.5 - 5.1 mmol/L   Chloride 93 (L) 101 - 111 mmol/L   CO2 30 22 - 32 mmol/L   Glucose, Bld 124 (H) 65 - 99 mg/dL   BUN 27 (H) 6 - 20 mg/dL   Creatinine, Ser 1.15 (H) 0.44 - 1.00 mg/dL   Calcium 9.4 8.9 - 10.3 mg/dL   GFR calc non Af Amer 45 (L) >60 mL/min   GFR calc Af Amer 52 (L) >60 mL/min    Comment: (NOTE) The eGFR has been calculated using the CKD EPI equation. This calculation has not been validated in all clinical  situations. eGFR's persistently <60 mL/min signify possible Chronic Kidney Disease.    Anion gap 12 5 - 15  Magnesium     Status: None   Collection Time: 08/02/15  3:52 AM  Result Value Ref Range   Magnesium 1.8 1.7 - 2.4 mg/dL  CBC     Status: Abnormal   Collection Time: 08/02/15  3:52 AM  Result Value Ref Range   WBC 8.5 4.0 - 10.5 K/uL   RBC 3.17 (L) 3.87 - 5.11 MIL/uL   Hemoglobin 10.5 (L) 12.0 - 15.0 g/dL   HCT 32.3 (L) 36.0 - 46.0 %   MCV 101.9 (H) 78.0 - 100.0 fL   MCH 33.1 26.0 - 34.0 pg   MCHC 32.5 30.0 - 36.0 g/dL   RDW 13.3 11.5 - 15.5 %   Platelets 234 150 - 400 K/uL  Basic metabolic panel     Status: Abnormal   Collection Time: 08/03/15  5:52 AM  Result Value Ref Range   Sodium 135 135 - 145 mmol/L   Potassium 4.2 3.5 - 5.1 mmol/L   Chloride 88 (L) 101 - 111 mmol/L   CO2 36 (H) 22 - 32 mmol/L   Glucose, Bld 112 (H) 65 - 99 mg/dL   BUN 28 (H) 6 - 20 mg/dL   Creatinine, Ser 1.23 (H) 0.44 - 1.00 mg/dL  Calcium 9.4 8.9 - 10.3 mg/dL   GFR calc non Af Amer 41 (L) >60 mL/min   GFR calc Af Amer 48 (L) >60 mL/min    Comment: (NOTE) The eGFR has been calculated using the CKD EPI equation. This calculation has not been validated in all clinical situations. eGFR's persistently <60 mL/min signify possible Chronic Kidney Disease.    Anion gap 11 5 - 15  Magnesium     Status: None   Collection Time: 08/03/15  5:52 AM  Result Value Ref Range   Magnesium 1.9 1.7 - 2.4 mg/dL    Imaging: Imaging results have been reviewed  Tele- SB 58  Assessment/Plan:   1. Principal Problem: 2.   Acute diastolic CHF (congestive heart failure) 3. Active Problems: 4.   Hypothyroidism 5.   Hyperlipidemia 6.   Essential hypertension 7.   Acute respiratory failure with hypoxia 8.   AKI (acute kidney injury) 9.   Sinus bradycardia 10.   Hyponatremia 11.   Acute kidney injury (nontraumatic) 12.   Time Spent Directly with Patient:  20 minutes  Length of Stay:  LOS: 4 days    Improving. I/O - > 4 L. On IV Lasix and metolazone. Wgt 181 # (dry wgt 176). Labs OK. 2D nl LV fxn. SB in high 50s. Will re check 12 lead. Can prob Tx to PO lasix ( 40 mg PO BID ) tomorrow and DC. She already has a scheduled appointment with me 8/16  Quay Burow 08/03/2015, 9:14 AM

## 2015-08-03 NOTE — Progress Notes (Signed)
Heart Failure Navigator Consult Note  Presentation: Colleen [name] is a 80 y.o. female with recent diagnosis of Diastolic CHF who presents to the ED with complaints of worsening SOB, DOE, Orthopnea and Edema of both lower legs over a 1 week period. She had been seen by her PCP and Cardiologist and sent for a CTA of the Chest And 2D ECHO on 08/05 which were negative For findings of A Pulmonary embolism but revealed Congestive Heart Failure and An EF > 55%. She was placed on Lasix 40 mg daily for 5 days but reports that she had little response to the treatment. She continued to worsen so she came to the ED. She denies any chest pain or fevers or chills.   Past Medical History  Diagnosis Date  . Hyperlipidemia   . Hypertension   . Hypothyroidism   . TIA (transient ischemic attack) 2001  . Carotid artery disease   . Shortness of breath   . CHF (congestive heart failure)     Social History   Social History  . Marital Status: Married    Spouse Name: N/A  . Number of Children: N/A  . Years of Education: N/A   Social History Main Topics  . Smoking status: Former Research scientist (life sciences)  . Smokeless tobacco: None  . Alcohol Use: Yes  . Drug Use: No  . Sexual Activity: Not Asked   Other Topics Concern  . None   Social History Narrative    ECHO:Study Conclusions--07/28/15  - Left ventricle: The cavity size was normal. Wall thickness was normal. Systolic function was vigorous. The estimated ejection fraction was in the range of 65% to 70%. Wall motion was normal; there were no regional wall motion abnormalities. - Mitral valve: Calcified annulus. There was mild regurgitation. - Left atrium: The atrium was mildly dilated. - Tricuspid valve: There was mild-moderate regurgitation directed centrally. - Pulmonary arteries: Systolic pressure was moderately increased. PA peak pressure: 49 mm Hg (S).  Transthoracic echocardiography. M-mode, limited 2D, limited spectral Doppler,  and color Doppler. Birthdate: Patient birthdate: 05/03/1935. Age: Patient is 80 yr old. Sex: Gender: female.  BMI: 31 kg/m^2. Blood pressure:   130/66 Patient status: Outpatient. Study date: Study date: 07/28/2015. Study time: 10:05 AM. BNP    Component Value Date/Time   BNP 346.3* 07/30/2015 2050    ProBNP No results found for: PROBNP   Education Assessment and Provision:  Detailed education and instructions provided on heart failure disease management including the following:  Signs and symptoms of Heart Failure When to call the physician Importance of daily weights Low sodium diet Fluid restriction Medication management Anticipated future follow-up appointments  Patient education given on each of the above topics.  Patient acknowledges understanding and acceptance of all instructions.  I spoke with Colleen [name] regarding her HF.  She tells me that this information is all "new" for her.  We discussed the importance of daily weights and how they pertain to signs and symptoms of HF.  She says that she was weighing prior to admission and had seen an increase in her weight even though her appetite was poor.  We reviewed a low sodium diet and high sodium foods to avoid.  She had been eating chicken noodle soup prior to admission.  She admits that she has struggles with not using salt.  I emphasized that sodium is in most foods, that she must be very careful and read labels.  I will also refer a dietician for more diet education.  She says  that she can get and take all medications with no issue.  She lives in [place] with her husband.  She sees Dr. Gwenlyn Found as an outpatient.   Education Materials:  "Living Better With Heart Failure" Booklet, Daily Weight Tracker Tool .   High Risk Criteria for Readmission and/or Poor Patient Outcomes:   EF <30%- No-65-70%  2 or more admissions in 6 months- yes --was recently here with HF  Difficult social situation- No  Demonstrates  medication noncompliance- No  Barriers of Care:  Knowledge and compliance  Discharge Planning:   Plans to return to home with husband

## 2015-08-03 NOTE — Progress Notes (Signed)
PATIENT DETAILS Name: Colleen [name] Age: 80 y.o. Sex: female Date of Birth: Apr 13, 1935 Admit Date: 07/30/2015 Admitting Physician Theressa Millard, MD [id number] [name], DO  Subjective: Shortness of breath much better this morning-almost close to usual baseline  Assessment/Plan: Principal Problem: Acute diastolic CHF: Improved with IV Lasix and metolazone, weight decreased to 181 pounds (190 on admit), negative balance of 4.6 L. Continue diuretics per cardiology  Active Problems: Acute hypoxic respiratory failure: Secondary to above. Titrate down oxygen as tolerated.  Sinus bradycardia: Heart rate much better, continued to monitor off beta blockers. Reviewed most recent outpatient cardiology note-patient had a history of junctional bradycardia, she apparently also had a history of Mobitz type II AV block.  ARF: Suspect secondary to CHF/diuretics. Creatinine stable  Hyponatremia: Likely secondary to hypervolemia, resolved with IV Lasix.  Hypertension: Continue to hold lisinopril/HCTZ-blood pressure controlled with IV Lasix. Follow  Hypothyroidism: Continue with Synthroid  Hyperlipidemia: Resume niacin on discharge  Disposition: Remain inpatient-home in the next 2 days  Antimicrobial agents  See below  Anti-infectives    None      DVT Prophylaxis: Prophylactic Lovenox   Code Status: Full code   Family Communication None at bedside  Procedures: None  CONSULTS:  cardiology  Time spent 25  minutes-Greater than 50% of this time was spent in counseling, explanation of diagnosis, planning of further management, and coordination of care.  MEDICATIONS: Scheduled Meds: . calcium carbonate  1,250 mg Oral QPM  . cholecalciferol  1,000 Units Oral QPM  . cyanocobalamin  500 mcg Oral QPM  . [START ON 08/04/2015] enoxaparin (LOVENOX) injection  40 mg Subcutaneous Q24H  . furosemide  80 mg Intravenous Q12H  . levothyroxine  100 mcg Oral QAC  breakfast  . metolazone  2.5 mg Oral Daily  . potassium chloride  20 mEq Oral BID  . sodium chloride  3 mL Intravenous Q12H   Continuous Infusions:  PRN Meds:.sodium chloride, acetaminophen, ondansetron (ZOFRAN) IV, sodium chloride    PHYSICAL EXAM: Vital signs in last 24 hours: Filed Vitals:   08/02/15 1401 08/02/15 1800 08/02/15 2112 08/03/15 0643  BP: 135/44 140/52 134/47 129/51  Pulse: 60 54 56 64  Temp: 98.5 F (36.9 C) 97.9 F (36.6 C) 98.7 F (37.1 C) 97.8 F (36.6 C)  TempSrc: Oral Oral Oral Oral  Resp: 14 18 18 18   Height:      Weight:    82.101 kg (181 lb)  SpO2: 94% 93% 95% 99%    Weight change: -0.635 kg (-1 lb 6.4 oz) Filed Weights   08/01/15 0500 08/02/15 0311 08/03/15 0643  Weight: 85.14 kg (187 lb 11.2 oz) 82.736 kg (182 lb 6.4 oz) 82.101 kg (181 lb)   Body mass index is 30.12 kg/(m^2).   Gen Exam: Awake and alert with clear speech.   Neck: Supple, No JVD.   Chest: B/L Clear CVS: S1 S2 Regular, no murmurs.  Abdomen: soft, BS +, non tender, non distended.  Extremities:trace edema, lower extremities warm to touch. Neurologic: Non Focal.  Skin: No Rash.   Wounds: N/A.    Intake/Output from previous day:  Intake/Output Summary (Last 24 hours) at 08/03/15 1307 Last data filed at 08/03/15 5427  Gross per 24 hour  Intake    600 ml  Output   1350 ml  Net   -750 ml     LAB RESULTS: CBC  Recent Labs Lab 07/30/15 2050 08/02/15  0352  WBC 7.4 8.5  HGB 11.3* 10.5*  HCT 34.0* 32.3*  PLT 283 234  MCV 100.6* 101.9*  MCH 33.4 33.1  MCHC 33.2 32.5  RDW 13.2 13.3  LYMPHSABS PENDING  --   MONOABS PENDING  --   EOSABS PENDING  --   BASOSABS PENDING  --     Chemistries   Recent Labs Lab 07/30/15 2050 07/31/15 0454 07/31/15 1012 08/01/15 0524 08/02/15 0352 08/03/15 0552  NA 131* 131*  --  132* 135 135  K 3.9 3.8  --  3.8 3.7 4.2  CL 95* 96*  --  96* 93* 88*  CO2 24 26  --  28 30 36*  GLUCOSE 115* 101*  --  113* 124* 112*  BUN 31* 31*   --  32* 27* 28*  CREATININE 1.49* 1.52*  --  1.34* 1.15* 1.23*  CALCIUM 9.2 8.9  --  9.4 9.4 9.4  MG  --   --  1.9  --  1.8 1.9    CBG: No results for input(s): GLUCAP in the last 168 hours.  GFR Estimated Creatinine Clearance: 40.5 mL/min (by C-G formula based on Cr of 1.23).  Coagulation profile No results for input(s): INR, PROTIME in the last 168 hours.  Cardiac Enzymes  Recent Labs Lab 07/31/15 1012 07/31/15 1735 07/31/15 2256  TROPONINI 0.04* 0.03 0.04*    Invalid input(s): POCBNP No results for input(s): DDIMER in the last 72 hours. No results for input(s): HGBA1C in the last 72 hours. No results for input(s): CHOL, HDL, LDLCALC, TRIG, CHOLHDL, LDLDIRECT in the last 72 hours. No results for input(s): TSH, T4TOTAL, T3FREE, THYROIDAB in the last 72 hours.  Invalid input(s): FREET3 No results for input(s): VITAMINB12, FOLATE, FERRITIN, TIBC, IRON, RETICCTPCT in the last 72 hours. No results for input(s): LIPASE, AMYLASE in the last 72 hours.  Urine Studies No results for input(s): UHGB, CRYS in the last 72 hours.  Invalid input(s): UACOL, UAPR, USPG, UPH, UTP, UGL, UKET, UBIL, UNIT, UROB, ULEU, UEPI, UWBC, URBC, UBAC, CAST, UCOM, BILUA  MICROBIOLOGY: Recent Results (from the past 240 hour(s))  Urine Culture     Status: None   Collection Time: 07/28/15  5:48 PM  Result Value Ref Range Status   Colony Count >=100,000 COLONIES/ML  Final   Organism ID, Bacteria Multiple bacterial morphotypes present, none  Final   Organism ID, Bacteria predominant. Suggest appropriate recollection if   Final   Organism ID, Bacteria clinically indicated.  Final  Urine culture     Status: None   Collection Time: 08/01/15  4:31 PM  Result Value Ref Range Status   Specimen Description URINE, CLEAN CATCH  Final   Special Requests NONE  Final   Culture MULTIPLE SPECIES PRESENT, SUGGEST RECOLLECTION  Final   Report Status 08/03/2015 FINAL  Final    RADIOLOGY STUDIES/RESULTS: Dg  Chest 2 View  07/22/2015   CLINICAL DATA:  Shortness of breath, dry cough.  EXAM: CHEST  2 VIEW  COMPARISON:  02/20/2015  FINDINGS: There is mild cardiomegaly and vascular congestion. No overt edema. No confluent opacities or effusions. No acute bony abnormality.  IMPRESSION: Cardiomegaly, vascular congestion.   Electronically Signed   By: Rolm Baptise M.D.   On: 07/22/2015 18:48   Ct Angio Chest Pe W/cm &/or Wo Cm  07/28/2015   CLINICAL DATA:  Shortness breath for 2 weeks. Personal history of smoking. The patient quit smoking 15 years ago.  EXAM: CT ANGIOGRAPHY CHEST WITH CONTRAST  TECHNIQUE:  Multidetector CT imaging of the chest was performed using the standard protocol during bolus administration of intravenous contrast. Multiplanar CT image reconstructions and MIPs were obtained to evaluate the vascular anatomy.  CONTRAST:  123mL OMNIPAQUE IOHEXOL 350 MG/ML SOLN  COMPARISON:  Two-view chest x-ray 07/22/2015.  FINDINGS: Pulmonary arterial opacification is satisfactory. The study is mildly degraded by patient breathing motion. No focal filling defects are evident to suggest pulmonary emboli.  The heart is mildly enlarged. Coronary artery calcifications are present. There is no significant pericardial effusion.  A precarinal lymph node measures 1.2 cm in short axis. Subcentimeter prevascular lymph nodes are present. No other pathologically enlarged nodes are present. No significant axillary nodes are present.  Small moderate bilateral pleural effusions are present. There is some associated atelectasis.  Diffuse ground-glass attenuation is present throughout both lungs, likely representing edema. No significant airspace consolidation is present.  The bone windows demonstrate exaggerated kyphosis in the lower thoracic spine. No focal lytic or blastic lesions are present.  Review of the MIP images confirms the above findings.  IMPRESSION: 1. No evidence for pulmonary embolus. 2. Cardiomegaly, interstitial edema,  and bilateral pleural effusions are compatible with congestive heart failure. 3. No focal airspace opacification other than dependent atelectasis. 4. Enlarged precarinal lymph node. Other smaller mediastinal nodes are present as well. These are likely reactive in the setting of congestive heart failure. These results will be called to the ordering clinician or representative by the Radiologist Assistant, and communication documented in the PACS or zVision Dashboard.   Electronically Signed   By: San Morelle M.D.   On: 07/28/2015 11:15   Dg Chest Port 1 View  07/30/2015   CLINICAL DATA:  Patient with shortness of breath since last Saturday. Recent diagnosis congestive heart failure.  EXAM: PORTABLE CHEST - 1 VIEW  COMPARISON:  Chest CT 07/28/2015; chest radiograph 07/22/2015  FINDINGS: Monitoring leads overlie the patient. Cardiomegaly. Moderate left and small right pleural effusions. Bilateral perihilar interstitial pulmonary opacities. No pneumothorax.  IMPRESSION: Cardiomegaly with increasing interstitial opacities concerning for pulmonary edema. Moderate left and small right pleural effusions.   Electronically Signed   By: Lovey Newcomer M.D.   On: 07/30/2015 21:18    Oren Binet, MD  [facility] Pager:[phone]  If 7PM-7AM, please contact night-coverage [email or web address] Password [email or web address] 08/03/2015, 1:07 PM   LOS: 4 days

## 2015-08-04 ENCOUNTER — Inpatient Hospital Stay (HOSPITAL_COMMUNITY): Payer: Medicare Other

## 2015-08-04 LAB — BASIC METABOLIC PANEL
Anion gap: 12 (ref 5–15)
BUN: 30 mg/dL — ABNORMAL HIGH (ref 6–20)
CALCIUM: 9.6 mg/dL (ref 8.9–10.3)
CO2: 37 mmol/L — ABNORMAL HIGH (ref 22–32)
CREATININE: 1.09 mg/dL — AB (ref 0.44–1.00)
Chloride: 87 mmol/L — ABNORMAL LOW (ref 101–111)
GFR calc Af Amer: 55 mL/min — ABNORMAL LOW (ref >60)
GFR, EST NON AFRICAN AMERICAN: 48 mL/min — AB (ref >60)
GLUCOSE: 105 mg/dL — AB (ref 65–99)
Potassium: 3.3 mmol/L — ABNORMAL LOW (ref 3.5–5.1)
SODIUM: 136 mmol/L (ref 135–145)

## 2015-08-04 MED ORDER — POTASSIUM CHLORIDE CRYS ER 20 MEQ PO TBCR
20.0000 meq | EXTENDED_RELEASE_TABLET | Freq: Once | ORAL | Status: AC
  Administered 2015-08-04: 20 meq via ORAL
  Filled 2015-08-04: qty 1

## 2015-08-04 NOTE — Progress Notes (Signed)
SATURATION QUALIFICATIONS: (This note is used to comply with regulatory documentation for home oxygen)  Patient Saturations on Room Air at Rest = 87%  Patient Saturations on Room Air while Ambulating = 81%  Patient Saturations on 2 Liters of oxygen while Ambulating = 92%  Please briefly explain why patient needs home oxygen: pt O2 on room air was 87, she took some deep breaths and O2 came up to 89, while amb on room air O2 dropped to 81%, put on 2L went to 92%

## 2015-08-04 NOTE — Research (Signed)
ReDS Vest Discharge Study  Results of ReDS reading  Your patient is in the Blinded arm of the Vest at Discharge study.  Your patient has had a ReDS Vest reading and the reading has been transmitted to the cloud.  Your patient is ok for discharge.    Thank You   The research team    

## 2015-08-04 NOTE — Progress Notes (Signed)
UR completed 

## 2015-08-04 NOTE — Progress Notes (Signed)
Nutrition Education Note  RD consulted for nutrition education regarding low sodium diet for CHF.  RD discussed "Heart Failure Nutrition Therapy" handout from the Academy of [employer]. Reviewed patient's dietary recall. Provided examples on ways to decrease sodium intake in diet. Discouraged intake of processed foods and use of salt shaker. Encouraged fresh fruits and vegetables as well as whole grain sources of carbohydrates to maximize fiber intake. Provided cooking tips, seasoning tips, and resources for low sodium recipes.   RD discussed why it is important for patient to adhere to diet recommendations, and emphasized the role of fluids, foods to avoid, and importance of weighing self daily. Teach back method used.  Expect good compliance.  Body mass index is 29.54 kg/(m^2). Pt meets criteria for Overweight based on current BMI.  Current diet order is Heart Healthy, patient is consuming approximately 100% of meals at this time. Labs and medications reviewed. No further nutrition interventions warranted at this time. RD contact information provided. If additional nutrition issues arise, please re-consult RD.   Scarlette Ar RD, LDN Inpatient Clinical Dietitian Pager: [phone] After Hours Pager: [phone]

## 2015-08-04 NOTE — Progress Notes (Signed)
Pt a/o, no c/o pain, pt having DOE, humidification put on O2 for dry nares and pt educated on incentive spirometer, pt stable

## 2015-08-04 NOTE — Progress Notes (Signed)
Physical Therapy Treatment Patient Details Name: Colleen [name] MRN: [id number] DOB: [birth year] Today's Date: 08/04/2015    History of Present Illness 80 y.o. female with recent diagnosis of Diastolic CHF who presents to the ED with complaints of worsening SOB, DOE, Orthopnea and Edema of both lower legs over a 1 week period.    PT Comments    Pt was too tired from walks without O2 to walk with PT, but did get a short walk in after radiology transporter arrived to take her to test.  Will be able to continue therapy next session and may try to eliminate O2 if able.  Follow Up Recommendations  No PT follow up     Equipment Recommendations  None recommended by PT    Recommendations for Other Services       Precautions / Restrictions Precautions Precautions: Fall;Other (comment) (O2 sats dropping without O2) Precaution Comments: monitor o2 Restrictions Weight Bearing Restrictions: No    Mobility  Bed Mobility               General bed mobility comments: up when PT entered  Transfers Overall transfer level: Modified independent                  Ambulation/Gait Ambulation/Gait assistance: Supervision Ambulation Distance (Feet): 15 Feet Assistive device: None Gait Pattern/deviations: Step-through pattern;Wide base of support Gait velocity: reduced Gait velocity interpretation: Below normal speed for age/gender General Gait Details: Limited walk due to arrival of radiology transporter to take for imaging   Stairs            Wheelchair Mobility    Modified Rankin (Stroke Patients Only)       Balance Overall balance assessment: No apparent balance deficits (not formally assessed)                                  Cognition Arousal/Alertness: Awake/alert Behavior During Therapy: WFL for tasks assessed/performed Overall Cognitive Status: Within Functional Limits for tasks assessed                      Exercises General  Exercises - Lower Extremity Ankle Circles/Pumps: AAROM;Both;5 reps Quad Sets: AROM;Both;10 reps Short Arc Quad: Strengthening;Both;10 reps Heel Slides: Both;Strengthening;10 reps Hip ABduction/ADduction: Strengthening;Both;10 reps Hip Flexion/Marching: AROM;Both;5 reps    General Comments General comments (skin integrity, edema, etc.): Pt had walk with nursing earlier to determine that her O2 sats are below 88% with no O2 and did not want to take an extensive walk with PT due to fatigue.      Pertinent Vitals/Pain Pain Assessment: No/denies pain    Home Living                      Prior Function            PT Goals (current goals can now be found in the care plan section) Acute Rehab PT Goals Patient Stated Goal: to get home Progress towards PT goals: Progressing toward goals    Frequency  Min 3X/week    PT Plan Current plan remains appropriate    Co-evaluation             End of Session Equipment Utilized During Treatment: Oxygen Activity Tolerance: Patient tolerated treatment well;Patient limited by fatigue Patient left: in chair;Other (comment) (leaving with tech/transporter to radiology)     Time: 0630-1601 PT Time Calculation (min) (ACUTE ONLY):  14 min  Charges:  $Therapeutic Exercise: 8-22 mins                    G Codes:      Ramond Dial 2015-08-30, 1:25 PM   [name], PT MS Acute Rehab Dept. Number: [facility] [phone] and [place] [phone]

## 2015-08-04 NOTE — Progress Notes (Signed)
PATIENT DETAILS Name: Colleen [name] Age: 80 y.o. Sex: female Date of Birth: 1935/06/11 Admit Date: 07/30/2015 Admitting Physician Theressa Millard, MD [id number] [name], DO  Subjective: Feels Shortness of breath on ambulation.  Assessment/Plan: Principal Problem: Acute diastolic CHF: Improved with IV Lasix and metolazone, weight decreased to 177 pounds (190 on admit), negative balance of 5.5 L. Although improved-hypoxic ambulation-therefore will hold planned discharge, and continue with IV Diuretics-CXR still shows congestion.    Active Problems: Acute hypoxic respiratory failure: Secondary to above. Titrate down oxygen as tolerated.  Sinus bradycardia: Heart rate much better, continued to monitor off beta blockers. Reviewed most recent outpatient cardiology note-patient had a history of junctional bradycardia, she apparently also had a history of Mobitz type II AV block.  ARF: Suspect secondary to CHF/diuretics. Creatinine stable  Hyponatremia: Likely secondary to hypervolemia, resolved with IV Lasix.  Hypertension: Continue to hold lisinopril/HCTZ-blood pressure controlled with IV Lasix. Follow  Hypothyroidism: Continue with Synthroid  Hyperlipidemia: Resume niacin on discharge  Disposition: Remain inpatient-home in the next 2 days  Antimicrobial agents  See below  Anti-infectives    None      DVT Prophylaxis: Prophylactic Lovenox   Code Status: Full code   Family Communication None at bedside  Procedures: None  CONSULTS:  cardiology  Time spent 25  minutes-Greater than 50% of this time was spent in counseling, explanation of diagnosis, planning of further management, and coordination of care.  MEDICATIONS: Scheduled Meds: . calcium carbonate  1,250 mg Oral QPM  . cholecalciferol  1,000 Units Oral QPM  . cyanocobalamin  500 mcg Oral QPM  . enoxaparin (LOVENOX) injection  40 mg Subcutaneous Q24H  . furosemide  80 mg Intravenous  Q12H  . levothyroxine  100 mcg Oral QAC breakfast  . metolazone  2.5 mg Oral Daily  . potassium chloride  20 mEq Oral BID  . sodium chloride  3 mL Intravenous Q12H   Continuous Infusions:  PRN Meds:.sodium chloride, acetaminophen, calcium carbonate, ondansetron (ZOFRAN) IV, sodium chloride    PHYSICAL EXAM: Vital signs in last 24 hours: Filed Vitals:   08/03/15 0643 08/03/15 1513 08/03/15 2100 08/04/15 0549  BP: 129/51 131/52 142/55 138/41  Pulse: 64 53 54 54  Temp: 97.8 F (36.6 C) 97.6 F (36.4 C) 98 F (36.7 C) 98 F (36.7 C)  TempSrc: Oral Oral Oral Oral  Resp: 18   18  Height:      Weight: 82.101 kg (181 lb)   80.513 kg (177 lb 8 oz)  SpO2: 99% 93% 93% 94%    Weight change: -1.588 kg (-3 lb 8 oz) Filed Weights   08/02/15 0311 08/03/15 0643 08/04/15 0549  Weight: 82.736 kg (182 lb 6.4 oz) 82.101 kg (181 lb) 80.513 kg (177 lb 8 oz)   Body mass index is 29.54 kg/(m^2).   Gen Exam: Awake and alert with clear speech.   Neck: Supple, No JVD.   Chest: B/L Clear CVS: S1 S2 Regular, no murmurs.  Abdomen: soft, BS +, non tender, non distended.  Extremities:trace edema, lower extremities warm to touch. Neurologic: Non Focal.  Skin: No Rash.   Wounds: N/A.    Intake/Output from previous day:  Intake/Output Summary (Last 24 hours) at 08/04/15 1310 Last data filed at 08/04/15 0600  Gross per 24 hour  Intake    720 ml  Output   1701 ml  Net   -981 ml  LAB RESULTS: CBC  Recent Labs Lab 07/30/15 2050 08/02/15 0352  WBC 7.4 8.5  HGB 11.3* 10.5*  HCT 34.0* 32.3*  PLT 283 234  MCV 100.6* 101.9*  MCH 33.4 33.1  MCHC 33.2 32.5  RDW 13.2 13.3  LYMPHSABS PENDING  --   MONOABS PENDING  --   EOSABS PENDING  --   BASOSABS PENDING  --     Chemistries   Recent Labs Lab 07/31/15 0454 07/31/15 1012 08/01/15 0524 08/02/15 0352 08/03/15 0552 08/04/15 0500  NA 131*  --  132* 135 135 136  K 3.8  --  3.8 3.7 4.2 3.3*  CL 96*  --  96* 93* 88* 87*  CO2 26   --  28 30 36* 37*  GLUCOSE 101*  --  113* 124* 112* 105*  BUN 31*  --  32* 27* 28* 30*  CREATININE 1.52*  --  1.34* 1.15* 1.23* 1.09*  CALCIUM 8.9  --  9.4 9.4 9.4 9.6  MG  --  1.9  --  1.8 1.9  --     CBG: No results for input(s): GLUCAP in the last 168 hours.  GFR Estimated Creatinine Clearance: 45.3 mL/min (by C-G formula based on Cr of 1.09).  Coagulation profile No results for input(s): INR, PROTIME in the last 168 hours.  Cardiac Enzymes  Recent Labs Lab 07/31/15 1012 07/31/15 1735 07/31/15 2256  TROPONINI 0.04* 0.03 0.04*    Invalid input(s): POCBNP No results for input(s): DDIMER in the last 72 hours. No results for input(s): HGBA1C in the last 72 hours. No results for input(s): CHOL, HDL, LDLCALC, TRIG, CHOLHDL, LDLDIRECT in the last 72 hours. No results for input(s): TSH, T4TOTAL, T3FREE, THYROIDAB in the last 72 hours.  Invalid input(s): FREET3 No results for input(s): VITAMINB12, FOLATE, FERRITIN, TIBC, IRON, RETICCTPCT in the last 72 hours. No results for input(s): LIPASE, AMYLASE in the last 72 hours.  Urine Studies No results for input(s): UHGB, CRYS in the last 72 hours.  Invalid input(s): UACOL, UAPR, USPG, UPH, UTP, UGL, UKET, UBIL, UNIT, UROB, ULEU, UEPI, UWBC, URBC, UBAC, CAST, UCOM, BILUA  MICROBIOLOGY: Recent Results (from the past 240 hour(s))  Urine Culture     Status: None   Collection Time: 07/28/15  5:48 PM  Result Value Ref Range Status   Colony Count >=100,000 COLONIES/ML  Final   Organism ID, Bacteria Multiple bacterial morphotypes present, none  Final   Organism ID, Bacteria predominant. Suggest appropriate recollection if   Final   Organism ID, Bacteria clinically indicated.  Final  Urine culture     Status: None   Collection Time: 08/01/15  4:31 PM  Result Value Ref Range Status   Specimen Description URINE, CLEAN CATCH  Final   Special Requests NONE  Final   Culture MULTIPLE SPECIES PRESENT, SUGGEST RECOLLECTION  Final    Report Status 08/03/2015 FINAL  Final    RADIOLOGY STUDIES/RESULTS: Dg Chest 2 View  08/04/2015   CLINICAL DATA:  Shortness of breath, low oxygen saturation  EXAM: CHEST  2 VIEW  COMPARISON:  07/30/2015  FINDINGS: There is stable cardiomegaly. There are trace bilateral pleural effusions. There is bilateral diffuse interstitial thickening. There is no pneumothorax. There is no acute osseous abnormality.  IMPRESSION: Findings most consistent with mild CHF.   Electronically Signed   By: Kathreen Devoid   On: 08/04/2015 12:36   Dg Chest 2 View  07/22/2015   CLINICAL DATA:  Shortness of breath, dry cough.  EXAM: CHEST  2 VIEW  COMPARISON:  02/20/2015  FINDINGS: There is mild cardiomegaly and vascular congestion. No overt edema. No confluent opacities or effusions. No acute bony abnormality.  IMPRESSION: Cardiomegaly, vascular congestion.   Electronically Signed   By: Rolm Baptise M.D.   On: 07/22/2015 18:48   Ct Angio Chest Pe W/cm &/or Wo Cm  07/28/2015   CLINICAL DATA:  Shortness breath for 2 weeks. Personal history of smoking. The patient quit smoking 15 years ago.  EXAM: CT ANGIOGRAPHY CHEST WITH CONTRAST  TECHNIQUE: Multidetector CT imaging of the chest was performed using the standard protocol during bolus administration of intravenous contrast. Multiplanar CT image reconstructions and MIPs were obtained to evaluate the vascular anatomy.  CONTRAST:  123mL OMNIPAQUE IOHEXOL 350 MG/ML SOLN  COMPARISON:  Two-view chest x-ray 07/22/2015.  FINDINGS: Pulmonary arterial opacification is satisfactory. The study is mildly degraded by patient breathing motion. No focal filling defects are evident to suggest pulmonary emboli.  The heart is mildly enlarged. Coronary artery calcifications are present. There is no significant pericardial effusion.  A precarinal lymph node measures 1.2 cm in short axis. Subcentimeter prevascular lymph nodes are present. No other pathologically enlarged nodes are present. No significant  axillary nodes are present.  Small moderate bilateral pleural effusions are present. There is some associated atelectasis.  Diffuse ground-glass attenuation is present throughout both lungs, likely representing edema. No significant airspace consolidation is present.  The bone windows demonstrate exaggerated kyphosis in the lower thoracic spine. No focal lytic or blastic lesions are present.  Review of the MIP images confirms the above findings.  IMPRESSION: 1. No evidence for pulmonary embolus. 2. Cardiomegaly, interstitial edema, and bilateral pleural effusions are compatible with congestive heart failure. 3. No focal airspace opacification other than dependent atelectasis. 4. Enlarged precarinal lymph node. Other smaller mediastinal nodes are present as well. These are likely reactive in the setting of congestive heart failure. These results will be called to the ordering clinician or representative by the Radiologist Assistant, and communication documented in the PACS or zVision Dashboard.   Electronically Signed   By: San Morelle M.D.   On: 07/28/2015 11:15   Dg Chest Port 1 View  07/30/2015   CLINICAL DATA:  Patient with shortness of breath since last Saturday. Recent diagnosis congestive heart failure.  EXAM: PORTABLE CHEST - 1 VIEW  COMPARISON:  Chest CT 07/28/2015; chest radiograph 07/22/2015  FINDINGS: Monitoring leads overlie the patient. Cardiomegaly. Moderate left and small right pleural effusions. Bilateral perihilar interstitial pulmonary opacities. No pneumothorax.  IMPRESSION: Cardiomegaly with increasing interstitial opacities concerning for pulmonary edema. Moderate left and small right pleural effusions.   Electronically Signed   By: Lovey Newcomer M.D.   On: 07/30/2015 21:18    Oren Binet, MD  [facility] Pager:[phone]  If 7PM-7AM, please contact night-coverage [email or web address] Password [email or web address] 08/04/2015, 1:10 PM   LOS: 5 days

## 2015-08-04 NOTE — Progress Notes (Signed)
Patient Profile: Colleen [name] is a 80 yo female with PMHx of diastolic CHF (echo 06/25/93 EF 65-70%), HTN, Hypothyroidism, and sinus bradycardia who presented to the ED on 07/30/15 with complaint of DOE, weight gain, orthopnea and increased edema. W/u including CXR and BNP c/w acute CHF exacerbation  Subjective: Feels better. Breathing improved.   Objective: Vital signs in last 24 hours: Temp:  [97.6 F (36.4 C)-98 F (36.7 C)] 98 F (36.7 C) (08/12 0549) Pulse Rate:  [53-54] 54 (08/12 0549) Resp:  [18] 18 (08/12 0549) BP: (131-142)/(41-55) 138/41 mmHg (08/12 0549) SpO2:  [93 %-94 %] 94 % (08/12 0549) Weight:  [177 lb 8 oz (80.513 kg)] 177 lb 8 oz (80.513 kg) (08/12 0549) Last BM Date: 08/03/15  Intake/Output from previous day: 08/11 0701 - 08/12 0700 In: 63 [P.O.:960] Out: 1901 [Urine:1900; Stool:1] Intake/Output this shift:    Medications Current Facility-Administered Medications  Medication Dose Route Frequency Provider Last Rate Last Dose  . 0.9 %  sodium chloride infusion  250 mL Intravenous PRN Theressa Millard, MD      . acetaminophen (TYLENOL) tablet 650 mg  650 mg Oral Q4H PRN Theressa Millard, MD      . calcium carbonate (OS-CAL - dosed in mg of elemental calcium) tablet 1,250 mg  1,250 mg Oral QPM Theressa Millard, MD   1,250 mg at 07/31/15 1745  . calcium carbonate (TUMS - dosed in mg elemental calcium) chewable tablet 400 mg of elemental calcium  400 mg of elemental calcium Oral TID PRN Rhetta Mura Schorr, NP   400 mg of elemental calcium at 08/03/15 2157  . cholecalciferol (VITAMIN D) tablet 1,000 Units  1,000 Units Oral QPM Theressa Millard, MD   1,000 Units at 07/31/15 1745  . cyanocobalamin tablet 500 mcg  500 mcg Oral QPM Theressa Millard, MD   500 mcg at 07/31/15 1745  . enoxaparin (LOVENOX) injection 40 mg  40 mg Subcutaneous Q24H Jonetta Osgood, MD      . furosemide (LASIX) injection 80 mg  80 mg Intravenous Q12H Rhonda G Barrett, PA-C   80 mg  at 08/03/15 2124  . levothyroxine (SYNTHROID, LEVOTHROID) tablet 100 mcg  100 mcg Oral QAC breakfast Theressa Millard, MD   100 mcg at 08/04/15 [phone]  . metolazone (ZAROXOLYN) tablet 2.5 mg  2.5 mg Oral Daily Belkys A Regalado, MD   2.5 mg at 08/03/15 1030  . ondansetron (ZOFRAN) injection 4 mg  4 mg Intravenous Q6H PRN Theressa Millard, MD      . potassium chloride SA (K-DUR,KLOR-CON) CR tablet 20 mEq  20 mEq Oral BID Evelene Croon Barrett, PA-C   20 mEq at 08/03/15 2124  . sodium chloride 0.9 % injection 3 mL  3 mL Intravenous Q12H Theressa Millard, MD   3 mL at 08/03/15 2124  . sodium chloride 0.9 % injection 3 mL  3 mL Intravenous PRN Theressa Millard, MD        PE: General appearance: alert, cooperative and no distress Neck: no carotid bruit and no JVD Lungs: faint left sided basilar rales Heart: regular rate and rhythm, S1, S2 normal, no murmur, click, rub or gallop Extremities: no LEE Pulses: 2+ and symmetric Skin: warm and dry Neurologic: Grossly normal  Lab Results:   Recent Labs  08/02/15 0352  WBC 8.5  HGB 10.5*  HCT 32.3*  PLT 234   BMET  Recent Labs  08/02/15 0352 08/03/15 0552 08/04/15 0500  NA 135 135 136  K 3.7 4.2 3.3*  CL 93* 88* 87*  CO2 30 36* 37*  GLUCOSE 124* 112* 105*  BUN 27* 28* 30*  CREATININE 1.15* 1.23* 1.09*  CALCIUM 9.4 9.4 9.6     Assessment/Plan  Principal Problem:   Acute diastolic CHF (congestive heart failure) Active Problems:   Hypothyroidism   Hyperlipidemia   Essential hypertension   Acute respiratory failure with hypoxia   AKI (acute kidney injury)   Sinus bradycardia   Hyponatremia   Acute kidney injury (nontraumatic)   1. A/C Diastolic CHF: EF normal at 65-70%.  She continues to diurese well. -1.9 L out in past 24 hrs. I/Os net negative 5.6L since admit. She is near her dry weight of 176 lb. Weight today is 177. Still with faint left basilar rales that improve after cough. Can transition to PO lasix, 40 mg BID.     2. HTN: BP is well controlled on current regimen.   3. Acute Kidney Injury: Scr continues to improve, now at 1.09 (1.23 yesterday). Recheck BMP as an outpatient and resume ACE-I once normalized.   4. Hypokalemia: K is 3.3 today. Supplement with K-dur.   Dispo: stable for discharge from a cardiac standpoint. F/u with Dr. Gwenlyn Found 08/08/15.   LOS: 5 days    Brittainy M. Ladoris Gene 08/04/2015 8:07 AM   Agree with note written by Ellen Henri  [facility]  Looks great. Good diuresis. Back to baseline. I/O  Neg > 5L. Labs OK. Can DC home on lasix 40 mg PO BID. Will get BMET as an OP Monday. She already has an appointment with me next Tuesday  Quay Burow 08/04/2015 11:06 AM

## 2015-08-05 LAB — BASIC METABOLIC PANEL
ANION GAP: 13 (ref 5–15)
BUN: 33 mg/dL — ABNORMAL HIGH (ref 6–20)
CO2: 37 mmol/L — ABNORMAL HIGH (ref 22–32)
Calcium: 9.5 mg/dL (ref 8.9–10.3)
Chloride: 84 mmol/L — ABNORMAL LOW (ref 101–111)
Creatinine, Ser: 1.16 mg/dL — ABNORMAL HIGH (ref 0.44–1.00)
GFR calc Af Amer: 51 mL/min — ABNORMAL LOW (ref >60)
GFR calc non Af Amer: 44 mL/min — ABNORMAL LOW (ref >60)
Glucose, Bld: 113 mg/dL — ABNORMAL HIGH (ref 65–99)
Potassium: 3.2 mmol/L — ABNORMAL LOW (ref 3.5–5.1)
SODIUM: 134 mmol/L — AB (ref 135–145)

## 2015-08-05 MED ORDER — POTASSIUM CHLORIDE CRYS ER 20 MEQ PO TBCR
20.0000 meq | EXTENDED_RELEASE_TABLET | Freq: Once | ORAL | Status: AC
  Administered 2015-08-05: 20 meq via ORAL
  Filled 2015-08-05: qty 1

## 2015-08-05 NOTE — Progress Notes (Signed)
PATIENT DETAILS Name: Colleen [name] Age: 80 y.o. Sex: female Date of Birth: 1935/02/03 Admit Date: 07/30/2015 Admitting Physician Theressa Millard, MD [id number] [name], DO  Subjective: Still Short of breath on ambulation, also hypoxic on ambulation 81% on room air.   Assessment/Plan: Principal Problem: Acute diastolic CHF: Improved with IV Lasix and metolazone, weight decreased to 177 pounds (190 on admit), negative balance of 5.5 L. Although improved-hypoxic on ambulation- continue with IV Diuretics-CXR on 8/12 still shows congestion.    Active Problems: Acute hypoxic respiratory failure: Secondary to above.Will check lower ext doppler's. Titrate down oxygen as tolerated.  Sinus bradycardia: Heart rate much better, continue to monitor off beta blockers. Reviewed most recent outpatient cardiology note-patient had a history of junctional bradycardia, she apparently also had a history of Mobitz type II AV block.  ARF: Suspect secondary to CHF/diuretics. Creatinine stable  Hyponatremia: Likely secondary to hypervolemia, improved with IV Lasix.  Hypertension: Continue to hold lisinopril/HCTZ-blood pressure controlled with IV Lasix. Follow  Hypothyroidism: Continue with Synthroid  Hyperlipidemia: Resume niacin on discharge  Disposition: Remain inpatient-home when no longer hypoxic/SOB on ambulation  Antimicrobial agents  See below  Anti-infectives    None      DVT Prophylaxis: Prophylactic Lovenox   Code Status: Full code   Family Communication None at bedside  Procedures: None  CONSULTS:  cardiology  Time spent 25  minutes-Greater than 50% of this time was spent in counseling, explanation of diagnosis, planning of further management, and coordination of care.  MEDICATIONS: Scheduled Meds: . calcium carbonate  1,250 mg Oral QPM  . cholecalciferol  1,000 Units Oral QPM  . cyanocobalamin  500 mcg Oral QPM  . enoxaparin (LOVENOX) injection   40 mg Subcutaneous Q24H  . furosemide  80 mg Intravenous Q12H  . levothyroxine  100 mcg Oral QAC breakfast  . metolazone  2.5 mg Oral Daily  . potassium chloride  20 mEq Oral BID  . sodium chloride  3 mL Intravenous Q12H   Continuous Infusions:  PRN Meds:.sodium chloride, acetaminophen, calcium carbonate, ondansetron (ZOFRAN) IV, sodium chloride    PHYSICAL EXAM: Vital signs in last 24 hours: Filed Vitals:   08/04/15 1412 08/04/15 2050 08/05/15 0553 08/05/15 1006  BP: 136/47 141/53 126/51 138/50  Pulse:  55 50 56  Temp: 98.2 F (36.8 C) 97.9 F (36.6 C) 97.9 F (36.6 C) 98 F (36.7 C)  TempSrc: Oral Oral Oral Oral  Resp: 20 18 18 20   Height:      Weight:   79.198 kg (174 lb 9.6 oz)   SpO2: 91% 94% 95% 92%    Weight change: -1.315 kg (-2 lb 14.4 oz) Filed Weights   08/03/15 0643 08/04/15 0549 08/05/15 0553  Weight: 82.101 kg (181 lb) 80.513 kg (177 lb 8 oz) 79.198 kg (174 lb 9.6 oz)   Body mass index is 29.05 kg/(m^2).   Gen Exam: Awake and alert with clear speech.   Neck: Supple, No JVD.   Chest: B/L Clear CVS: S1 S2 Regular, no murmurs.  Abdomen: soft, BS +, non tender, non distended.  Extremities:trace edema, lower extremities warm to touch. Neurologic: Non Focal.  Skin: No Rash.   Wounds: N/A.    Intake/Output from previous day:  Intake/Output Summary (Last 24 hours) at 08/05/15 1131 Last data filed at 08/05/15 1009  Gross per 24 hour  Intake   1013 ml  Output   1550 ml  Net   -537 ml     LAB RESULTS: CBC  Recent Labs Lab 07/30/15 2050 08/02/15 0352  WBC 7.4 8.5  HGB 11.3* 10.5*  HCT 34.0* 32.3*  PLT 283 234  MCV 100.6* 101.9*  MCH 33.4 33.1  MCHC 33.2 32.5  RDW 13.2 13.3  LYMPHSABS PENDING  --   MONOABS PENDING  --   EOSABS PENDING  --   BASOSABS PENDING  --     Chemistries   Recent Labs Lab 07/31/15 1012 08/01/15 0524 08/02/15 0352 08/03/15 0552 08/04/15 0500 08/05/15 0359  NA  --  132* 135 135 136 134*  K  --  3.8 3.7  4.2 3.3* 3.2*  CL  --  96* 93* 88* 87* 84*  CO2  --  28 30 36* 37* 37*  GLUCOSE  --  113* 124* 112* 105* 113*  BUN  --  32* 27* 28* 30* 33*  CREATININE  --  1.34* 1.15* 1.23* 1.09* 1.16*  CALCIUM  --  9.4 9.4 9.4 9.6 9.5  MG 1.9  --  1.8 1.9  --   --     CBG: No results for input(s): GLUCAP in the last 168 hours.  GFR Estimated Creatinine Clearance: 42.3 mL/min (by C-G formula based on Cr of 1.16).  Coagulation profile No results for input(s): INR, PROTIME in the last 168 hours.  Cardiac Enzymes  Recent Labs Lab 07/31/15 1012 07/31/15 1735 07/31/15 2256  TROPONINI 0.04* 0.03 0.04*    Invalid input(s): POCBNP No results for input(s): DDIMER in the last 72 hours. No results for input(s): HGBA1C in the last 72 hours. No results for input(s): CHOL, HDL, LDLCALC, TRIG, CHOLHDL, LDLDIRECT in the last 72 hours. No results for input(s): TSH, T4TOTAL, T3FREE, THYROIDAB in the last 72 hours.  Invalid input(s): FREET3 No results for input(s): VITAMINB12, FOLATE, FERRITIN, TIBC, IRON, RETICCTPCT in the last 72 hours. No results for input(s): LIPASE, AMYLASE in the last 72 hours.  Urine Studies No results for input(s): UHGB, CRYS in the last 72 hours.  Invalid input(s): UACOL, UAPR, USPG, UPH, UTP, UGL, UKET, UBIL, UNIT, UROB, ULEU, UEPI, UWBC, URBC, UBAC, CAST, UCOM, BILUA  MICROBIOLOGY: Recent Results (from the past 240 hour(s))  Urine Culture     Status: None   Collection Time: 07/28/15  5:48 PM  Result Value Ref Range Status   Colony Count >=100,000 COLONIES/ML  Final   Organism ID, Bacteria Multiple bacterial morphotypes present, none  Final   Organism ID, Bacteria predominant. Suggest appropriate recollection if   Final   Organism ID, Bacteria clinically indicated.  Final  Urine culture     Status: None   Collection Time: 08/01/15  4:31 PM  Result Value Ref Range Status   Specimen Description URINE, CLEAN CATCH  Final   Special Requests NONE  Final   Culture  MULTIPLE SPECIES PRESENT, SUGGEST RECOLLECTION  Final   Report Status 08/03/2015 FINAL  Final    RADIOLOGY STUDIES/RESULTS: Dg Chest 2 View  08/04/2015   CLINICAL DATA:  Shortness of breath, low oxygen saturation  EXAM: CHEST  2 VIEW  COMPARISON:  07/30/2015  FINDINGS: There is stable cardiomegaly. There are trace bilateral pleural effusions. There is bilateral diffuse interstitial thickening. There is no pneumothorax. There is no acute osseous abnormality.  IMPRESSION: Findings most consistent with mild CHF.   Electronically Signed   By: Kathreen Devoid   On: 08/04/2015 12:36   Dg Chest 2 View  07/22/2015   CLINICAL DATA:  Shortness of breath, dry cough.  EXAM: CHEST  2 VIEW  COMPARISON:  02/20/2015  FINDINGS: There is mild cardiomegaly and vascular congestion. No overt edema. No confluent opacities or effusions. No acute bony abnormality.  IMPRESSION: Cardiomegaly, vascular congestion.   Electronically Signed   By: Rolm Baptise M.D.   On: 07/22/2015 18:48   Ct Angio Chest Pe W/cm &/or Wo Cm  07/28/2015   CLINICAL DATA:  Shortness breath for 2 weeks. Personal history of smoking. The patient quit smoking 15 years ago.  EXAM: CT ANGIOGRAPHY CHEST WITH CONTRAST  TECHNIQUE: Multidetector CT imaging of the chest was performed using the standard protocol during bolus administration of intravenous contrast. Multiplanar CT image reconstructions and MIPs were obtained to evaluate the vascular anatomy.  CONTRAST:  19mL OMNIPAQUE IOHEXOL 350 MG/ML SOLN  COMPARISON:  Two-view chest x-ray 07/22/2015.  FINDINGS: Pulmonary arterial opacification is satisfactory. The study is mildly degraded by patient breathing motion. No focal filling defects are evident to suggest pulmonary emboli.  The heart is mildly enlarged. Coronary artery calcifications are present. There is no significant pericardial effusion.  A precarinal lymph node measures 1.2 cm in short axis. Subcentimeter prevascular lymph nodes are present. No other  pathologically enlarged nodes are present. No significant axillary nodes are present.  Small moderate bilateral pleural effusions are present. There is some associated atelectasis.  Diffuse ground-glass attenuation is present throughout both lungs, likely representing edema. No significant airspace consolidation is present.  The bone windows demonstrate exaggerated kyphosis in the lower thoracic spine. No focal lytic or blastic lesions are present.  Review of the MIP images confirms the above findings.  IMPRESSION: 1. No evidence for pulmonary embolus. 2. Cardiomegaly, interstitial edema, and bilateral pleural effusions are compatible with congestive heart failure. 3. No focal airspace opacification other than dependent atelectasis. 4. Enlarged precarinal lymph node. Other smaller mediastinal nodes are present as well. These are likely reactive in the setting of congestive heart failure. These results will be called to the ordering clinician or representative by the Radiologist Assistant, and communication documented in the PACS or zVision Dashboard.   Electronically Signed   By: San Morelle M.D.   On: 07/28/2015 11:15   Dg Chest Port 1 View  07/30/2015   CLINICAL DATA:  Patient with shortness of breath since last Saturday. Recent diagnosis congestive heart failure.  EXAM: PORTABLE CHEST - 1 VIEW  COMPARISON:  Chest CT 07/28/2015; chest radiograph 07/22/2015  FINDINGS: Monitoring leads overlie the patient. Cardiomegaly. Moderate left and small right pleural effusions. Bilateral perihilar interstitial pulmonary opacities. No pneumothorax.  IMPRESSION: Cardiomegaly with increasing interstitial opacities concerning for pulmonary edema. Moderate left and small right pleural effusions.   Electronically Signed   By: Lovey Newcomer M.D.   On: 07/30/2015 21:18    Oren Binet, MD  [facility] Pager:[phone]  If 7PM-7AM, please contact night-coverage [email or web address] Password [facility] 08/05/2015,  11:31 AM   LOS: 6 days

## 2015-08-05 NOTE — Progress Notes (Signed)
Patient Profile: Colleen [name] is a 80 yo female with PMHx of diastolic CHF (echo 08/31/82 EF 65-70%), HTN, Hypothyroidism, and sinus bradycardia who presented to the ED on 07/30/15 with complaint of DOE, weight gain, orthopnea and increased edema. W/u including CXR and BNP c/w acute CHF exacerbation  Subjective: Feels better. Earlier today still feels short of breath with activity   Objective: Vital signs in last 24 hours: Temp:  [97.9 F (36.6 C)-98.2 F (36.8 C)] 98 F (36.7 C) (08/13 1006) Pulse Rate:  [50-56] 56 (08/13 1006) Resp:  [18-20] 20 (08/13 1006) BP: (126-141)/(47-53) 138/50 mmHg (08/13 1006) SpO2:  [91 %-95 %] 92 % (08/13 1006) Weight:  [174 lb 9.6 oz (79.198 kg)] 174 lb 9.6 oz (79.198 kg) (08/13 0553) Last BM Date: 08/04/15  Intake/Output from previous day: 08/12 0701 - 08/13 0700 In: 890 [P.O.:890] Out: 1400 [Urine:1400] Intake/Output this shift: Total I/O In: 603 [P.O.:600; I.V.:3] Out: 650 [Urine:650]  Medications Current Facility-Administered Medications  Medication Dose Route Frequency Provider Last Rate Last Dose  . 0.9 %  sodium chloride infusion  250 mL Intravenous PRN Theressa Millard, MD      . acetaminophen (TYLENOL) tablet 650 mg  650 mg Oral Q4H PRN Theressa Millard, MD      . calcium carbonate (OS-CAL - dosed in mg of elemental calcium) tablet 1,250 mg  1,250 mg Oral QPM Theressa Millard, MD   1,250 mg at 07/31/15 1745  . calcium carbonate (TUMS - dosed in mg elemental calcium) chewable tablet 400 mg of elemental calcium  400 mg of elemental calcium Oral TID PRN Rhetta Mura Schorr, NP   400 mg of elemental calcium at 08/03/15 2157  . cholecalciferol (VITAMIN D) tablet 1,000 Units  1,000 Units Oral QPM Theressa Millard, MD   1,000 Units at 07/31/15 1745  . cyanocobalamin tablet 500 mcg  500 mcg Oral QPM Theressa Millard, MD   500 mcg at 07/31/15 1745  . enoxaparin (LOVENOX) injection 40 mg  40 mg Subcutaneous Q24H Jonetta Osgood, MD   40  mg at 08/05/15 1009  . furosemide (LASIX) injection 80 mg  80 mg Intravenous Q12H Rhonda G Barrett, PA-C   80 mg at 08/05/15 1050  . levothyroxine (SYNTHROID, LEVOTHROID) tablet 100 mcg  100 mcg Oral QAC breakfast Theressa Millard, MD   100 mcg at 08/05/15 0602  . metolazone (ZAROXOLYN) tablet 2.5 mg  2.5 mg Oral Daily Belkys A Regalado, MD   2.5 mg at 08/05/15 1008  . ondansetron (ZOFRAN) injection 4 mg  4 mg Intravenous Q6H PRN Theressa Millard, MD      . potassium chloride SA (K-DUR,KLOR-CON) CR tablet 20 mEq  20 mEq Oral BID Rhonda G Barrett, PA-C   20 mEq at 08/05/15 1009  . sodium chloride 0.9 % injection 3 mL  3 mL Intravenous Q12H Theressa Millard, MD   3 mL at 08/05/15 1009  . sodium chloride 0.9 % injection 3 mL  3 mL Intravenous PRN Theressa Millard, MD        PE: General appearance: alert, cooperative and no distress Neck: no carotid bruit and no JVD Lungs: faint left sided basilar rales Heart: regular rate and rhythm, S1, S2 normal, no murmur, click, rub or gallop Extremities: no LEE Pulses: 2+ and symmetric Skin: warm and dry Neurologic: Grossly normal  Lab Results:  No results for input(s): WBC, HGB, HCT, PLT in the last 72 hours. BMET  Recent  Labs  08/03/15 0552 08/04/15 0500 08/05/15 0359  NA 135 136 134*  K 4.2 3.3* 3.2*  CL 88* 87* 84*  CO2 36* 37* 37*  GLUCOSE 112* 105* 113*  BUN 28* 30* 33*  CREATININE 1.23* 1.09* 1.16*  CALCIUM 9.4 9.6 9.5     Assessment/Plan  Principal Problem:   Acute diastolic CHF (congestive heart failure) Active Problems:   Hypothyroidism   Hyperlipidemia   Essential hypertension   Acute respiratory failure with hypoxia   AKI (acute kidney injury)   Sinus bradycardia   Hyponatremia   Acute kidney injury (nontraumatic)   1. A/C Diastolic CHF: EF normal at 65-70%.  She continues to diurese well. -1.4 L out in past 24 hrs. I/Os net negative 6.1L since admit. Prior dry weight of 176 lb. Weight today is 174. Still  with faint left basilar rales that improve after cough. She is still feeling shortness breath with activity and we are giving her 1 extra day of IV Lasix with metolazone.  2. HTN: BP is well controlled on current regimen.   3. Acute Kidney Injury: Scr continues to improve, now at 1.09 (1.23 yesterday). Recheck BMP as an outpatient and resume ACE-I once normalized.   4. Hypokalemia: K is 3.2 today. Supplement with K-dur.   Dispo: Hopefully she will be able to go home tomorrow. F/u with Dr. Gwenlyn Found 08/08/15.   LOS: 6 days     Sanyia Dini 08/05/2015 1:58 PM

## 2015-08-06 ENCOUNTER — Inpatient Hospital Stay (HOSPITAL_COMMUNITY): Payer: Medicare Other

## 2015-08-06 DIAGNOSIS — M7989 Other specified soft tissue disorders: Secondary | ICD-10-CM

## 2015-08-06 DIAGNOSIS — R0602 Shortness of breath: Secondary | ICD-10-CM

## 2015-08-06 LAB — BRAIN NATRIURETIC PEPTIDE: B Natriuretic Peptide: 189.9 pg/mL — ABNORMAL HIGH (ref 0.0–100.0)

## 2015-08-06 LAB — BASIC METABOLIC PANEL
Anion gap: 12 (ref 5–15)
BUN: 38 mg/dL — ABNORMAL HIGH (ref 6–20)
CO2: 40 mmol/L — ABNORMAL HIGH (ref 22–32)
Calcium: 9.5 mg/dL (ref 8.9–10.3)
Chloride: 83 mmol/L — ABNORMAL LOW (ref 101–111)
Creatinine, Ser: 1.2 mg/dL — ABNORMAL HIGH (ref 0.44–1.00)
GFR calc Af Amer: 49 mL/min — ABNORMAL LOW (ref >60)
GFR, EST NON AFRICAN AMERICAN: 42 mL/min — AB (ref >60)
GLUCOSE: 116 mg/dL — AB (ref 65–99)
POTASSIUM: 3.3 mmol/L — AB (ref 3.5–5.1)
SODIUM: 135 mmol/L (ref 135–145)

## 2015-08-06 MED ORDER — POTASSIUM CHLORIDE CRYS ER 20 MEQ PO TBCR
40.0000 meq | EXTENDED_RELEASE_TABLET | Freq: Once | ORAL | Status: AC
  Administered 2015-08-06: 40 meq via ORAL
  Filled 2015-08-06: qty 2

## 2015-08-06 NOTE — Progress Notes (Signed)
Ambulate patient in hall way oxygen saturation start 84%/RA drop to 81%. No c/o pain, mild shortness of breath. MD notified. No new order given. Will continue to monitor patient.

## 2015-08-06 NOTE — Progress Notes (Signed)
PATIENT DETAILS Name: Colleen [name] Age: 80 y.o. Sex: female Date of Birth: November 04, 1935 Admit Date: 07/30/2015 Admitting Physician Theressa Millard, MD GNO:[id number] [name], DO  Subjective: Still Short of breath on ambulation  Assessment/Plan: Principal Problem: Acute diastolic CHF: Improved with IV Lasix and metolazone, weight decreased to 173 pounds (190 on admit), negative balance of 6.7 L. Although improved-hypoxic on ambulation- continue with IV Diuretics-CXR on 8/12 still shows congestion.    Active Problems: Acute hypoxic respiratory failure: Secondary to above.Will check lower ext doppler's. Titrate down oxygen as tolerated.  Sinus bradycardia: Heart rate much better, continue to monitor off beta blockers. Reviewed most recent outpatient cardiology note-patient had a history of junctional bradycardia, she apparently also had a history of Mobitz type II AV block.  ARF: Suspect secondary to CHF/diuretics. Creatinine stable around 1.2-follow lytes closely while on diuretics  Hyponatremia: Likely secondary to hypervolemia, improved with IV Lasix.  Hypertension: Continue to hold lisinopril/HCTZ-blood pressure controlled with IV Lasix. Follow  Hypothyroidism: Continue with Synthroid  Hyperlipidemia: Resume niacin on discharge  Disposition: Remain inpatient-home when no longer hypoxic/SOB on ambulation  Antimicrobial agents  See below  Anti-infectives    None      DVT Prophylaxis: Prophylactic Lovenox   Code Status: Full code   Family Communication None at bedside  Procedures: None  CONSULTS:  cardiology  Time spent 25  minutes-Greater than 50% of this time was spent in counseling, explanation of diagnosis, planning of further management, and coordination of care.  MEDICATIONS: Scheduled Meds: . calcium carbonate  1,250 mg Oral QPM  . cholecalciferol  1,000 Units Oral QPM  . cyanocobalamin  500 mcg Oral QPM  . enoxaparin (LOVENOX)  injection  40 mg Subcutaneous Q24H  . furosemide  80 mg Intravenous Q12H  . levothyroxine  100 mcg Oral QAC breakfast  . metolazone  2.5 mg Oral Daily  . potassium chloride  20 mEq Oral BID  . sodium chloride  3 mL Intravenous Q12H   Continuous Infusions:  PRN Meds:.sodium chloride, acetaminophen, calcium carbonate, ondansetron (ZOFRAN) IV, sodium chloride    PHYSICAL EXAM: Vital signs in last 24 hours: Filed Vitals:   08/05/15 1402 08/05/15 2119 08/06/15 0520 08/06/15 0952  BP: 137/52 144/58 125/50 133/46  Pulse: 55 52 54 54  Temp: 98.9 F (37.2 C) 98.6 F (37 C) 98.4 F (36.9 C) 98.6 F (37 C)  TempSrc: Oral Oral Oral Oral  Resp: 20 18 18 20   Height:      Weight:   78.79 kg (173 lb 11.2 oz)   SpO2: 94% 94% 92% 96%    Weight change: -0.408 kg (-14.4 oz) Filed Weights   08/04/15 0549 08/05/15 0553 08/06/15 0520  Weight: 80.513 kg (177 lb 8 oz) 79.198 kg (174 lb 9.6 oz) 78.79 kg (173 lb 11.2 oz)   Body mass index is 28.91 kg/(m^2).   Gen Exam: Awake and alert with clear speech.   Neck: Supple, No JVD.   Chest: B/L Clear CVS: S1 S2 Regular, no murmurs.  Abdomen: soft, BS +, non tender, non distended.  Extremities:trace edema, lower extremities warm to touch. Neurologic: Non Focal.  Skin: No Rash.   Wounds: N/A.    Intake/Output from previous day:  Intake/Output Summary (Last 24 hours) at 08/06/15 1157 Last data filed at 08/06/15 0528  Gross per 24 hour  Intake    720 ml  Output   1600 ml  Net   -880 ml     LAB RESULTS: CBC  Recent Labs Lab 07/30/15 2050 08/02/15 0352  WBC 7.4 8.5  HGB 11.3* 10.5*  HCT 34.0* 32.3*  PLT 283 234  MCV 100.6* 101.9*  MCH 33.4 33.1  MCHC 33.2 32.5  RDW 13.2 13.3  LYMPHSABS PENDING  --   MONOABS PENDING  --   EOSABS PENDING  --   BASOSABS PENDING  --     Chemistries   Recent Labs Lab 07/31/15 1012  08/02/15 0352 08/03/15 0552 08/04/15 0500 08/05/15 0359 08/06/15 0342  NA  --   < > 135 135 136 134* 135    K  --   < > 3.7 4.2 3.3* 3.2* 3.3*  CL  --   < > 93* 88* 87* 84* 83*  CO2  --   < > 30 36* 37* 37* 40*  GLUCOSE  --   < > 124* 112* 105* 113* 116*  BUN  --   < > 27* 28* 30* 33* 38*  CREATININE  --   < > 1.15* 1.23* 1.09* 1.16* 1.20*  CALCIUM  --   < > 9.4 9.4 9.6 9.5 9.5  MG 1.9  --  1.8 1.9  --   --   --   < > = values in this interval not displayed.  CBG: No results for input(s): GLUCAP in the last 168 hours.  GFR Estimated Creatinine Clearance: 40.7 mL/min (by C-G formula based on Cr of 1.2).  Coagulation profile No results for input(s): INR, PROTIME in the last 168 hours.  Cardiac Enzymes  Recent Labs Lab 07/31/15 1012 07/31/15 1735 07/31/15 2256  TROPONINI 0.04* 0.03 0.04*    Invalid input(s): POCBNP No results for input(s): DDIMER in the last 72 hours. No results for input(s): HGBA1C in the last 72 hours. No results for input(s): CHOL, HDL, LDLCALC, TRIG, CHOLHDL, LDLDIRECT in the last 72 hours. No results for input(s): TSH, T4TOTAL, T3FREE, THYROIDAB in the last 72 hours.  Invalid input(s): FREET3 No results for input(s): VITAMINB12, FOLATE, FERRITIN, TIBC, IRON, RETICCTPCT in the last 72 hours. No results for input(s): LIPASE, AMYLASE in the last 72 hours.  Urine Studies No results for input(s): UHGB, CRYS in the last 72 hours.  Invalid input(s): UACOL, UAPR, USPG, UPH, UTP, UGL, UKET, UBIL, UNIT, UROB, ULEU, UEPI, UWBC, URBC, UBAC, CAST, UCOM, BILUA  MICROBIOLOGY: Recent Results (from the past 240 hour(s))  Urine Culture     Status: None   Collection Time: 07/28/15  5:48 PM  Result Value Ref Range Status   Colony Count >=100,000 COLONIES/ML  Final   Organism ID, Bacteria Multiple bacterial morphotypes present, none  Final   Organism ID, Bacteria predominant. Suggest appropriate recollection if   Final   Organism ID, Bacteria clinically indicated.  Final  Urine culture     Status: None   Collection Time: 08/01/15  4:31 PM  Result Value Ref Range  Status   Specimen Description URINE, CLEAN CATCH  Final   Special Requests NONE  Final   Culture MULTIPLE SPECIES PRESENT, SUGGEST RECOLLECTION  Final   Report Status 08/03/2015 FINAL  Final    RADIOLOGY STUDIES/RESULTS: Dg Chest 2 View  08/04/2015   CLINICAL DATA:  Shortness of breath, low oxygen saturation  EXAM: CHEST  2 VIEW  COMPARISON:  07/30/2015  FINDINGS: There is stable cardiomegaly. There are trace bilateral pleural effusions. There is bilateral diffuse interstitial thickening. There is no pneumothorax. There is no acute osseous  abnormality.  IMPRESSION: Findings most consistent with mild CHF.   Electronically Signed   By: Kathreen Devoid   On: 08/04/2015 12:36   Dg Chest 2 View  07/22/2015   CLINICAL DATA:  Shortness of breath, dry cough.  EXAM: CHEST  2 VIEW  COMPARISON:  02/20/2015  FINDINGS: There is mild cardiomegaly and vascular congestion. No overt edema. No confluent opacities or effusions. No acute bony abnormality.  IMPRESSION: Cardiomegaly, vascular congestion.   Electronically Signed   By: Rolm Baptise M.D.   On: 07/22/2015 18:48   Ct Angio Chest Pe W/cm &/or Wo Cm  07/28/2015   CLINICAL DATA:  Shortness breath for 2 weeks. Personal history of smoking. The patient quit smoking 15 years ago.  EXAM: CT ANGIOGRAPHY CHEST WITH CONTRAST  TECHNIQUE: Multidetector CT imaging of the chest was performed using the standard protocol during bolus administration of intravenous contrast. Multiplanar CT image reconstructions and MIPs were obtained to evaluate the vascular anatomy.  CONTRAST:  145mL OMNIPAQUE IOHEXOL 350 MG/ML SOLN  COMPARISON:  Two-view chest x-ray 07/22/2015.  FINDINGS: Pulmonary arterial opacification is satisfactory. The study is mildly degraded by patient breathing motion. No focal filling defects are evident to suggest pulmonary emboli.  The heart is mildly enlarged. Coronary artery calcifications are present. There is no significant pericardial effusion.  A precarinal lymph  node measures 1.2 cm in short axis. Subcentimeter prevascular lymph nodes are present. No other pathologically enlarged nodes are present. No significant axillary nodes are present.  Small moderate bilateral pleural effusions are present. There is some associated atelectasis.  Diffuse ground-glass attenuation is present throughout both lungs, likely representing edema. No significant airspace consolidation is present.  The bone windows demonstrate exaggerated kyphosis in the lower thoracic spine. No focal lytic or blastic lesions are present.  Review of the MIP images confirms the above findings.  IMPRESSION: 1. No evidence for pulmonary embolus. 2. Cardiomegaly, interstitial edema, and bilateral pleural effusions are compatible with congestive heart failure. 3. No focal airspace opacification other than dependent atelectasis. 4. Enlarged precarinal lymph node. Other smaller mediastinal nodes are present as well. These are likely reactive in the setting of congestive heart failure. These results will be called to the ordering clinician or representative by the Radiologist Assistant, and communication documented in the PACS or zVision Dashboard.   Electronically Signed   By: San Morelle M.D.   On: 07/28/2015 11:15   Dg Chest Port 1 View  07/30/2015   CLINICAL DATA:  Patient with shortness of breath since last Saturday. Recent diagnosis congestive heart failure.  EXAM: PORTABLE CHEST - 1 VIEW  COMPARISON:  Chest CT 07/28/2015; chest radiograph 07/22/2015  FINDINGS: Monitoring leads overlie the patient. Cardiomegaly. Moderate left and small right pleural effusions. Bilateral perihilar interstitial pulmonary opacities. No pneumothorax.  IMPRESSION: Cardiomegaly with increasing interstitial opacities concerning for pulmonary edema. Moderate left and small right pleural effusions.   Electronically Signed   By: Lovey Newcomer M.D.   On: 07/30/2015 21:18    Oren Binet, MD  [facility] Pager:[phone]  [phone]  If 7PM-7AM, please contact night-coverage [email or web address] Password [facility] 08/06/2015, 11:57 AM   LOS: 7 days

## 2015-08-06 NOTE — Progress Notes (Signed)
Patient Profile: Colleen [name] is a 80 yo female with PMHx of diastolic CHF (echo 01/29/36 EF 65-70%), HTN, Hypothyroidism, and sinus bradycardia who presented to the ED on 07/30/15 with complaint of DOE, weight gain, orthopnea and increased edema. W/u including CXR and BNP c/w acute CHF exacerbation  Subjective: Feels better. Earlier today still feels short of breath with activity, desaturation in hallway to 81%. She jokingly states that she can't be here forever.  Objective: Vital signs in last 24 hours: Temp:  [98.4 F (36.9 C)-98.9 F (37.2 C)] 98.6 F (37 C) (08/14 0952) Pulse Rate:  [52-55] 54 (08/14 0952) Resp:  [18-20] 20 (08/14 0952) BP: (125-144)/(46-58) 133/46 mmHg (08/14 0952) SpO2:  [92 %-96 %] 96 % (08/14 0952) FiO2 (%):  [2 %] 2 % (08/13 1402) Weight:  [173 lb 11.2 oz (78.79 kg)] 173 lb 11.2 oz (78.79 kg) (08/14 0520) Last BM Date: 08/04/15  Intake/Output from previous day: 08/13 0701 - 08/14 0700 In: 1083 [P.O.:1080; I.V.:3] Out: 1750 [Urine:1750] Intake/Output this shift:    Medications Current Facility-Administered Medications  Medication Dose Route Frequency Provider Last Rate Last Dose  . 0.9 %  sodium chloride infusion  250 mL Intravenous PRN Theressa Millard, MD      . acetaminophen (TYLENOL) tablet 650 mg  650 mg Oral Q4H PRN Theressa Millard, MD      . calcium carbonate (OS-CAL - dosed in mg of elemental calcium) tablet 1,250 mg  1,250 mg Oral QPM Theressa Millard, MD   1,250 mg at 07/31/15 1745  . calcium carbonate (TUMS - dosed in mg elemental calcium) chewable tablet 400 mg of elemental calcium  400 mg of elemental calcium Oral TID PRN Rhetta Mura Schorr, NP   400 mg of elemental calcium at 08/03/15 2157  . cholecalciferol (VITAMIN D) tablet 1,000 Units  1,000 Units Oral QPM Theressa Millard, MD   1,000 Units at 07/31/15 1745  . cyanocobalamin tablet 500 mcg  500 mcg Oral QPM Theressa Millard, MD   500 mcg at 07/31/15 1745  . enoxaparin  (LOVENOX) injection 40 mg  40 mg Subcutaneous Q24H Jonetta Osgood, MD   40 mg at 08/06/15 0954  . furosemide (LASIX) injection 80 mg  80 mg Intravenous Q12H Evelene Croon Barrett, PA-C   80 mg at 08/05/15 2021  . levothyroxine (SYNTHROID, LEVOTHROID) tablet 100 mcg  100 mcg Oral QAC breakfast Theressa Millard, MD   100 mcg at 08/06/15 0729  . metolazone (ZAROXOLYN) tablet 2.5 mg  2.5 mg Oral Daily Belkys A Regalado, MD   2.5 mg at 08/06/15 0954  . ondansetron (ZOFRAN) injection 4 mg  4 mg Intravenous Q6H PRN Theressa Millard, MD      . potassium chloride SA (K-DUR,KLOR-CON) CR tablet 20 mEq  20 mEq Oral BID Evelene Croon Barrett, PA-C   20 mEq at 08/06/15 0954  . sodium chloride 0.9 % injection 3 mL  3 mL Intravenous Q12H Theressa Millard, MD   3 mL at 08/05/15 2200  . sodium chloride 0.9 % injection 3 mL  3 mL Intravenous PRN Theressa Millard, MD        PE: General appearance: alert, cooperative and no distress Neck: no carotid bruit and no JVD Lungs: faint left sided basilar rales Heart: regular rate and rhythm, S1, S2 normal, no murmur, click, rub or gallop Extremities: no LEE Pulses: 2+ and symmetric Skin: warm and dry Neurologic: Grossly normal  Lab Results:  No results for input(s): WBC, HGB, HCT, PLT in the last 72 hours. BMET  Recent Labs  08/04/15 0500 08/05/15 0359 08/06/15 0342  NA 136 134* 135  K 3.3* 3.2* 3.3*  CL 87* 84* 83*  CO2 37* 37* 40*  GLUCOSE 105* 113* 116*  BUN 30* 33* 38*  CREATININE 1.09* 1.16* 1.20*  CALCIUM 9.6 9.5 9.5     Assessment/Plan  Principal Problem:   Acute diastolic CHF (congestive heart failure) Active Problems:   Hypothyroidism   Hyperlipidemia   Essential hypertension   Acute respiratory failure with hypoxia   AKI (acute kidney injury)   Sinus bradycardia   Hyponatremia   Acute kidney injury (nontraumatic)   1. A/C Diastolic CHF: EF normal at 65-70%.   Prior dry weight of 176 lb. Weight today is 173. Lungs sound better  today. She is still feeling shortness breath with activity and we are giving her 1 extra day of IV Lasix with metolazone. BUN Creat mildly increased. Presumed lung water causing desaturation. I would presume that her BUN and creatinine will increase once again tomorrow and we should probably start by mouth Lasix. Chest x-ray on 08/04/15 showed findings consistent with heart failure.  2. HTN: BP is well controlled on current regimen.   3. Acute Kidney Injury: resume ACE-I once normalized.   4. Hypokalemia: K is 3.2 today. Supplement with K-dur.   Dispo: Hopefully she will be able to go home tomorrow. F/u with Dr. Gwenlyn Found 08/08/15.   LOS: 7 days     SKAINS, [place] 08/06/2015 10:37 AM

## 2015-08-06 NOTE — Progress Notes (Signed)
Bilateral lower extremity venous duplex completed:  No evidence of DVT, superficial thrombosis, or Baker's Cyst.

## 2015-08-07 ENCOUNTER — Inpatient Hospital Stay (HOSPITAL_COMMUNITY): Payer: Medicare Other

## 2015-08-07 DIAGNOSIS — J9620 Acute and chronic respiratory failure, unspecified whether with hypoxia or hypercapnia: Secondary | ICD-10-CM

## 2015-08-07 DIAGNOSIS — R918 Other nonspecific abnormal finding of lung field: Secondary | ICD-10-CM | POA: Insufficient documentation

## 2015-08-07 LAB — BASIC METABOLIC PANEL
Anion gap: 11 (ref 5–15)
BUN: 37 mg/dL — AB (ref 6–20)
CALCIUM: 9.5 mg/dL (ref 8.9–10.3)
CO2: 41 mmol/L — AB (ref 22–32)
Chloride: 83 mmol/L — ABNORMAL LOW (ref 101–111)
Creatinine, Ser: 1.15 mg/dL — ABNORMAL HIGH (ref 0.44–1.00)
GFR calc non Af Amer: 45 mL/min — ABNORMAL LOW (ref >60)
GFR, EST AFRICAN AMERICAN: 52 mL/min — AB (ref >60)
Glucose, Bld: 98 mg/dL (ref 65–99)
Potassium: 3 mmol/L — ABNORMAL LOW (ref 3.5–5.1)
Sodium: 135 mmol/L (ref 135–145)

## 2015-08-07 LAB — PULMONARY FUNCTION TEST
FEF 25-75 PRE: 0.66 L/s
FEF2575-%Pred-Pre: 41 %
FEV1-%Pred-Pre: 56 %
FEV1-Pre: 1.18 L
FEV1FVC-%PRED-PRE: 92 %
FEV6-%Pred-Pre: 64 %
FEV6-PRE: 1.72 L
FEV6FVC-%Pred-Pre: 104 %
FVC-%PRED-PRE: 60 %
PRE FEV6/FVC RATIO: 100 %
Pre FEV1/FVC ratio: 69 %

## 2015-08-07 LAB — SEDIMENTATION RATE: Sed Rate: 92 mm/hr — ABNORMAL HIGH (ref 0–22)

## 2015-08-07 LAB — CBC
HCT: 34.1 % — ABNORMAL LOW (ref 36.0–46.0)
Hemoglobin: 11.1 g/dL — ABNORMAL LOW (ref 12.0–15.0)
MCH: 33.2 pg (ref 26.0–34.0)
MCHC: 32.6 g/dL (ref 30.0–36.0)
MCV: 102.1 fL — ABNORMAL HIGH (ref 78.0–100.0)
PLATELETS: 258 10*3/uL (ref 150–400)
RBC: 3.34 MIL/uL — AB (ref 3.87–5.11)
RDW: 13 % (ref 11.5–15.5)
WBC: 7 10*3/uL (ref 4.0–10.5)

## 2015-08-07 LAB — C-REACTIVE PROTEIN: CRP: 0.7 mg/dL (ref <1.0)

## 2015-08-07 LAB — PROCALCITONIN: Procalcitonin: <0.1 ng/mL

## 2015-08-07 MED ORDER — TIOTROPIUM BROMIDE MONOHYDRATE 18 MCG IN CAPS
18.0000 ug | ORAL_CAPSULE | Freq: Every day | RESPIRATORY_TRACT | Status: DC
  Administered 2015-08-08 – 2015-08-16 (×7): 18 ug via RESPIRATORY_TRACT
  Filled 2015-08-07 (×2): qty 5

## 2015-08-07 NOTE — Progress Notes (Signed)
UR completed 

## 2015-08-07 NOTE — Progress Notes (Signed)
Patient Profile: [name] is a 80 yo female with PMHx of diastolic CHF (echo 05/24/68 EF 65-70%), HTN, Hypothyroidism, and sinus bradycardia who presented to the ED on 07/30/15 with complaint of DOE, weight gain, orthopnea and increased edema. W/u including CXR and BNP c/w acute CHF exacerbation  Subjective: +SOB with exertion  Objective: Vital signs in last 24 hours: Temp:  [98 F (36.7 C)-98.6 F (37 C)] 98.2 F (36.8 C) (08/15 0705) Pulse Rate:  [51-55] 51 (08/15 0705) Resp:  [18-20] 18 (08/15 0705) BP: (133-144)/(46-54) 136/49 mmHg (08/15 0705) SpO2:  [91 %-96 %] 94 % (08/15 0705) Weight:  [172 lb 6.4 oz (78.2 kg)] 172 lb 6.4 oz (78.2 kg) (08/15 0705) Weight change:  Last BM Date: 08/04/15 Intake/Output from previous day: -1870 08/14 0701 - 08/15 0700 In: 1080 [P.O.:1080] Out: 2950 [Urine:2950] Intake/Output this shift:    PE: General:Pleasant affect, NAD Skin:Warm and dry, brisk capillary refill HEENT:normocephalic, sclera clear, mucus membranes moist Neck:supple, + JVD Heart:S1S2 RRR without murmur, gallup, rub or click Lungs:clear with few rales, rhonchi, or wheezes SWN:IOEV, non tender, + BS, do not palpate liver spleen or masses Ext:no lower ext edema, 2+ pedal pulses, 2+ radial pulses Neuro:alert and oriented X 3, MAE, follows commands, + facial symmetry Tele: Junctional rhythm down to 44,     Lab Results:  Recent Labs  08/07/15 0315  WBC 7.0  HGB 11.1*  HCT 34.1*  PLT 258   BMET  Recent Labs  08/06/15 0342 08/07/15 0315  NA 135 135  K 3.3* 3.0*  CL 83* 83*  CO2 40* 41*  GLUCOSE 116* 98  BUN 38* 37*  CREATININE 1.20* 1.15*  CALCIUM 9.5 9.5   No results for input(s): TROPONINI in the last 72 hours.  Invalid input(s): CK, MB  Lab Results  Component Value Date   CHOL 148 02/21/2015   HDL 47 02/21/2015   LDLCALC 83 02/21/2015   TRIG 90 02/21/2015   CHOLHDL 3.1 02/21/2015   No results found for: HGBA1C   Lab Results    Component Value Date   TSH 3.342 07/31/2015     BNP (last 3 results)  Recent Labs  07/22/15 1715 07/30/15 2050 08/06/15 1703  BNP 394.9* 346.3* 189.9*      Studies/Results: Dg Chest Port 1 View  08/07/2015   CLINICAL DATA:  Shortness of Breath  EXAM: PORTABLE CHEST - 1 VIEW  COMPARISON:  08/04/2015  FINDINGS: Cardiac shadow remains enlarged. Prominent vascular congestion is again right greater than left with interstitial changes changes have progressed slightly in the interval from the prior exam although some of this may be technically related. Aortic calcifications are again seen. The bony structures are stable.  IMPRESSION: Changes consistent with CHF which have worsened slightly in the interval from the prior exam.   Electronically Signed   By: Inez Catalina M.D.   On: 08/07/2015 07:39    Medications: I have reviewed the patient's current medications. Scheduled Meds: . calcium carbonate  1,250 mg Oral QPM  . cholecalciferol  1,000 Units Oral QPM  . cyanocobalamin  500 mcg Oral QPM  . enoxaparin (LOVENOX) injection  40 mg Subcutaneous Q24H  . furosemide  80 mg Intravenous Q12H  . levothyroxine  100 mcg Oral QAC breakfast  . metolazone  2.5 mg Oral Daily  . potassium chloride  20 mEq Oral BID  . sodium chloride  3 mL Intravenous Q12H   Continuous Infusions:  PRN Meds:.sodium chloride, acetaminophen,  calcium carbonate, ondansetron (ZOFRAN) IV, sodium chloride  Assessment/Plan: Principal Problem:   Acute diastolic CHF (congestive heart failure) Active Problems:   Hypothyroidism   Hyperlipidemia   Essential hypertension   Acute respiratory failure with hypoxia   AKI (acute kidney injury)   Sinus bradycardia   Hyponatremia   Acute kidney injury (nontraumatic)  1. A/C Diastolic CHF: EF normal at 65-70%. Prior dry weight of 176 lb. Weight today is 172.6.  She is still feeling shortness breath with activity and rec'd 1 extra day of IV Lasix with metolazone and is -1870  today.  Since admit she in negative 8628.   BUN Creat stable today. Presumed lung water causing desaturation. Yesterday desat to 84% ON RA with walking.  Chest x-ray on 08/04/15 showed findings consistent with heart failure.  BNP is decreased.  Despite improvement with diuresis her hypoxia has increased.   2. HTN: BP is well controlled on current regimen.   3. Acute Kidney Injury: resume ACE-I once normalized. improved today.   4. Hypokalemia: K is 3.0 today. Supplement with K-dur. She is on 20 meq BID   5. Junctional rhythm, down to 44 previously was Mobitz II -her BB was stopped 07/28/15 HR still low ? EP consult, is this playing a role with her SOB   LOS: 8 days   Time spent with pt. : 15 minutes. [facility] R  Nurse Practitioner Certified Pager [phone] or after 5pm and on weekends call [phone] 08/07/2015, 8:29 AM   Patient examined chart reviewed  Still with crackles/rales in lungs.  Clinical picture is confusing with dyspnea disproportionate to cardiac findings.  She is a straight shooter and clearly has had a decline in breathing and functional Capacity since end of July after laser eye surgery.  She has had bradycardia for a long time and atenolol stopped 2 weeks ago. Don't think this is the issue  Would consult pulmonary.  Will get bubble study to r/o pulmonary shunt Continue diuretics.  BNP is under 200  Had normal stress test in March  [facility]

## 2015-08-07 NOTE — Care Management Important Message (Signed)
Important Message  Patient Details  Name: SHULAMIT DONOFRIO MRN: [id number] Date of Birth: 1935/03/03   Medicare Important Message Given:  Yes-third notification given    Pricilla Handler 08/07/2015, 3:02 PM

## 2015-08-07 NOTE — Progress Notes (Signed)
PT Cancellation Note  Patient Details Name: Colleen [name] MRN: [id number] DOB: 1935/07/15   Cancelled Treatment:    Reason Eval/Treat Not Completed: Patient at procedure or test/unavailable (RT doing test in room.  Will return tomorrow. )   Irwin Brakeman F 08/07/2015, 3:33 PM  [facility] [phone] (pager)

## 2015-08-07 NOTE — Progress Notes (Signed)
Patient's RN called and asked that vitamin D, calcium and vitamin B12 be removed from hospital profile.  Patient does not wish to take while she is here. She wishes to resume these when she is home.  [name] D. Kartik Fernando, PharmD, BCPS Clinical Pharmacist 08/07/2015 6:29 PM

## 2015-08-07 NOTE — Consult Note (Signed)
PULMONARY / CRITICAL CARE MEDICINE   Name: Colleen [name] MRN: [id number] DOB: 11-19-1935   PCP Garnet Koyanagi, DO   ADMISSION DATE:  07/30/2015 CONSULTATION DATE:  08/07/15 LOS 8 Justin Mend MD :  Dr Lovena Neighbours  CHIEF COMPLAINT:  Dyspnea, hypoxemia     HISTORY OF PRESENT ILLNESS:    80 year old female. Consult for dyspnea. This is associated with hypoxemia. History is obtained from the patient, review of the chart and in discussion with the referring hospitalist. Patient is ex heavy 40 pack per day smoker quit 15 years ago but without any residual dyspnea or oxygen use other than extremely mild dyspnea for heavy exertion. She's always been active with her activities of daily living. In February/March 2016 she had an admission for atypical chest pain. Myoview stress test was normal Approximately 3 weeks ago she decompensated with worsening dyspnea and was admitted to the hospital. In addition she says she had an admission in end of July 2016 presumably to this hospital and apparently she was diuresed and discharged. I do not see evidence of this hospitalization in our computer records. Within a week of discharge she had worsening edema and shortness of breath and associated hypoxemia and was admitted. This admission was on 07/30/2015. CT scan of the chest just prior to that on 07/28/2015 shows evidence of pulmonary congestion with bilateral pleural effusion [film was personally visualized]. According to the hospital she's been aggressively diuresed with no improvement in hypoxemia. She is now dependent on oxygen. According to the patient she still continues to be dyspneic but is significantly "at least 80%" improved since admission but she's not anywhere near her baseline. At admission over a week ago she was dyspneic even at rest but now she is able to go to the toilet and come back but she still does need oxygen. Pulmonary has been consulted for any pulmonary causes for dyspnea and  hypoxemia  Repeat CT scan high-resolution's of the chest today without contrast shows significant improvement compared to August 2016 based on personal review of the image. There is still some nonspecific infiltrates bilaterally consistent with a groundglass pattern. Formal report is still pending. Effusions are resolved. There is also some emphysema in the upper lobes    dyspnea and hypoxemia related workup  - Echo and Myoview in February 2011 are normal --Myoview March 2016 with a left ventricular ejection fraction of 73% low risk findings  -Ex heavy smoker 40 pack 15 years ago  - Pulmonary embolism ruled out 07/28/2015 CT angiogram chest  - DVT ruled out on Doppler ultrasound leg 08/06/2015   PAST MEDICAL HISTORY :   has a past medical history of Hyperlipidemia; Hypertension; Hypothyroidism; TIA (transient ischemic attack) (2001); Carotid artery disease; Shortness of breath; and CHF (congestive heart failure).  has past surgical history that includes Cholecystectomy; Abdominal hysterectomy (1975); Carotid endarterectomy (2001); doppler echocardiography (02/13/2010); doppler echocardiography (09/29/2007); NM MYOVIEW LTD (02/13/2010); carotid duplex (11/24/2012); carotid duplex (11/01/2010); and renal duplex (02/13/2010). Prior to Admission medications   Medication Sig Start Date End Date Taking? Authorizing Provider  Calcium Carbonate (GNP CALCIUM) 1500 MG TABS Take 1,500 mg by mouth every evening.    Yes Historical Provider, MD  Cholecalciferol 1000 UNITS tablet Take 1,000 Units by mouth every evening.    Yes Historical Provider, MD  levothyroxine (SYNTHROID, LEVOTHROID) 100 MCG tablet Take 100 mcg by mouth daily before breakfast.    Yes Historical Provider, MD  lisinopril-hydrochlorothiazide (PRINZIDE,ZESTORETIC) 20-12.5 MG per tablet Take 1 tablet by  mouth every evening.    Yes Historical Provider, MD  magnesium 30 MG tablet Take 30 mg by mouth every evening.    Yes Historical Provider, MD   Misc Natural Products (TURMERIC CURCUMIN) CAPS Take 1 capsule by mouth every evening.   Yes Historical Provider, MD  niacin 500 MG tablet Take 500 mg by mouth every evening. otc   Yes Historical Provider, MD  Potassium 99 MG TABS Take 99 mg by mouth every evening. otc   Yes Historical Provider, MD  vitamin B-12 (CYANOCOBALAMIN) 500 MCG tablet Take 500 mcg by mouth every evening.    Yes Historical Provider, MD   No Known Allergies  FAMILY HISTORY:  has no family status information on file.  SOCIAL HISTORY:  reports that she has quit smoking. She does not have any smokeless tobacco history on file. She reports that she drinks alcohol. She reports that she does not use illicit drugs.  REVIEW OF SYSTEMS:  *Positive for dyspnea. Anxious to go home. Needing oxygen. Otherwise 11 point review of systems is as per history of present illness and otherwise negative  VITAL SIGNS: Temp:  [98 F (36.7 C)-98.2 F (36.8 C)] 98.2 F (36.8 C) (08/15 0705) Pulse Rate:  [51-58] 58 (08/15 1123) Resp:  [18-20] 18 (08/15 0705) BP: (124-144)/(43-54) 124/43 mmHg (08/15 1123) SpO2:  [91 %-95 %] 94 % (08/15 0705) Weight:  [78.2 kg (172 lb 6.4 oz)] 78.2 kg (172 lb 6.4 oz) (08/15 0705) HEMODYNAMICS:   VENTILATOR SETTINGS:   INTAKE / OUTPUT:  Intake/Output Summary (Last 24 hours) at 08/07/15 1209 Last data filed at 08/07/15 1118  Gross per 24 hour  Intake   1495 ml  Output   2450 ml  Net   -955 ml    PHYSICAL EXAMINATION: General:  mildly obese lady. Sitting comfortable. Looks mildly deconditioned  Neuro:  Alert and oriented 3. Speech is normal. HEENT:  Mallampati class II airway. No neck nodes. Pupils equal and reactive to light. No elevated JVP. Oxygen on CardiovasculRegular rate and rhythm. No murmurs  Abdomen:  Soft nontender no organomegaly Musvloskeletal:  no cyanosis no clubbing no edema  Skin:  Intact LABS:  CBC  Recent Labs Lab 08/02/15 0352 08/07/15 0315  WBC 8.5 7.0  HGB 10.5*  11.1*  HCT 32.3* 34.1*  PLT 234 258   Coag's No results for input(s): APTT, INR in the last 168 hours. BMET  Recent Labs Lab 08/05/15 0359 08/06/15 0342 08/07/15 0315  NA 134* 135 135  K 3.2* 3.3* 3.0*  CL 84* 83* 83*  CO2 37* 40* 41*  BUN 33* 38* 37*  CREATININE 1.16* 1.20* 1.15*  GLUCOSE 113* 116* 98   Electrolytes  Recent Labs Lab 08/02/15 0352 08/03/15 0552  08/05/15 0359 08/06/15 0342 08/07/15 0315  CALCIUM 9.4 9.4  < > 9.5 9.5 9.5  MG 1.8 1.9  --   --   --   --   < > = values in this interval not displayed. Sepsis Markers No results for input(s): LATICACIDVEN, PROCALCITON, O2SATVEN in the last 168 hours. ABG No results for input(s): PHART, PCO2ART, PO2ART in the last 168 hours. Liver Enzymes No results for input(s): AST, ALT, ALKPHOS, BILITOT, ALBUMIN in the last 168 hours. Cardiac Enzymes  Recent Labs Lab 07/31/15 1735 07/31/15 2256  TROPONINI 0.03 0.04*   Glucose No results for input(s): GLUCAP in the last 168 hours.  Imaging Dg Chest Port 1 View  08/07/2015   CLINICAL DATA:  Shortness of Breath  EXAM: PORTABLE CHEST - 1 VIEW  COMPARISON:  08/04/2015  FINDINGS: Cardiac shadow remains enlarged. Prominent vascular congestion is again right greater than left with interstitial changes changes have progressed slightly in the interval from the prior exam although some of this may be technically related. Aortic calcifications are again seen. The bony structures are stable.  IMPRESSION: Changes consistent with CHF which have worsened slightly in the interval from the prior exam.   Electronically Signed   By: Inez Catalina M.D.   On: 08/07/2015 07:39     ASSESSMENT / PLAN:  PULMONARY  A: acute on chronic hypoxemic respiratory failure - class III dyspnea Requiring 2-3 L nasal cannula oxygen    - Clearly the first component was diastolic heart failure. Based on her history and history of diuresis and objective improvement on CT scan of the chest she is  significantly better. The issue is that of residual dyspnea and hypoxemia. At this point in time this can best be explained by rest with infiltrates and also some emphysema. There might be some underlying pulmonary hypertension but this is unclear at this point  - Etiology of the infiltrates could be viral or spontaneous clearing of HCAP or BOOP/ILD  P: - Await formal interpretation of the high-resolution CT scan of the chest - Get spirometry to document presence of obstructive lung disease and severity if any  - Empiric Spiriva inhaler treatment - Get autoimmune profile and sedimentation rate -  would like to figure out if she needs  steroid therapy systemic  -Oxygen therapy for pulse ox greater than 88%  - Regardless of the etiology of infiltrates it appears that the presence of these infiltrates suggest that it might take few to several weeks for them to resolve and it is possible that she may need to go home with oxygen therapy         Dr. Brand Males, M.D., [facility].C.P Pulmonary and Critical Care Medicine Staff Physician [facility] Pager: [phone], If no answer or between  15:00h - 7:00h: call [phone]  08/07/2015 12:21 PM

## 2015-08-07 NOTE — Progress Notes (Signed)
Pt refused bed and chair alarm.

## 2015-08-07 NOTE — Progress Notes (Signed)
SATURATION QUALIFICATIONS: (This note is used to comply with regulatory documentation for home oxygen)  Patient Saturations on Room Air at Rest = 90%  Patient Saturations on Room Air while Ambulating = 88 - 91%  Patient Saturations on 2 Liters of oxygen while Ambulating = 93%  Please briefly explain why patient needs home oxygen:  Pt does not qualify for home O2.

## 2015-08-07 NOTE — Progress Notes (Signed)
PT Cancellation Note  Patient Details Name: Colleen [name] MRN: [id number] DOB: 10/20/1935   Cancelled Treatment:    Reason Eval/Treat Not Completed: Patient at procedure or test/unavailable (Chest CTS, will check back as time permits.)   [name], SPT [phone]  08/07/2015, 10:24 AM

## 2015-08-07 NOTE — Progress Notes (Signed)
PATIENT DETAILS Name: Colleen [name] Age: 80 y.o. Sex: female Date of Birth: 10/29/1935 Admit Date: 07/30/2015 Admitting Physician Theressa Millard, MD GYJ:EHUDJS [name], DO  Subjective: Still Short of breath on ambulation-but without leg edema.   Assessment/Plan: Principal Problem: Acute hypoxic respiratory failure:Likely etiology felt to be  Acute diastolic heart failure, however despite of being below dry weight and significant diuresis, continues to have worsening x-ray changes along with hypoxia on ambulation. Have consulted PCCM-to evaluate for possible interstitial lung disease. CTA chest and lower extremity Dopplers negative for venous thromboembolism  Acute diastolic CHF: Improved with IV Lasix and metolazone, weight decreased to 172 pounds (190 on admit), negative balance of 8.1 L. Has no other signs of decompensation-still hypoxic in spite of significant diuresis. Await PCCM evaluation-if felt not to have a primary lung problem-may need a right heart catheterization   Sinus bradycardia: Heart rate much better, continue to monitor off beta blockers. Reviewed most recent outpatient cardiology note-patient had a history of junctional bradycardia, she apparently also had a history of Mobitz type II AV block.  ARF: Suspect secondary to CHF/diuretics. Creatinine stable-follow lytes closely while on diuretics  Hyponatremia: Likely secondary to hypervolemia, improved with IV Lasix.  Hypertension: Continue to hold lisinopril/HCTZ-blood pressure controlled with IV Lasix. Follow  Hypothyroidism: Continue with Synthroid  Hyperlipidemia: Resume niacin on discharge  Disposition: Remain inpatient-home when no longer hypoxic/SOB on ambulation  Antimicrobial agents  See below  Anti-infectives    None      DVT Prophylaxis: Prophylactic Lovenox   Code Status: Full code   Family Communication None at bedside  Procedures: None  CONSULTS:   cardiology  Time spent 25  minutes-Greater than 50% of this time was spent in counseling, explanation of diagnosis, planning of further management, and coordination of care.  MEDICATIONS: Scheduled Meds: . calcium carbonate  1,250 mg Oral QPM  . cholecalciferol  1,000 Units Oral QPM  . cyanocobalamin  500 mcg Oral QPM  . enoxaparin (LOVENOX) injection  40 mg Subcutaneous Q24H  . furosemide  80 mg Intravenous Q12H  . levothyroxine  100 mcg Oral QAC breakfast  . metolazone  2.5 mg Oral Daily  . potassium chloride  20 mEq Oral BID  . sodium chloride  3 mL Intravenous Q12H   Continuous Infusions:  PRN Meds:.sodium chloride, acetaminophen, calcium carbonate, ondansetron (ZOFRAN) IV, sodium chloride    PHYSICAL EXAM: Vital signs in last 24 hours: Filed Vitals:   08/06/15 0952 08/06/15 1413 08/06/15 2245 08/07/15 0705  BP: 133/46 140/52 144/54 136/49  Pulse: 54 55 54 51  Temp: 98.6 F (37 C) 98 F (36.7 C) 98 F (36.7 C) 98.2 F (36.8 C)  TempSrc: Oral Oral Oral Oral  Resp: 20 20 18 18   Height:      Weight:    78.2 kg (172 lb 6.4 oz)  SpO2: 96% 95% 91% 94%    Weight change:  Filed Weights   08/05/15 0553 08/06/15 0520 08/07/15 0705  Weight: 79.198 kg (174 lb 9.6 oz) 78.79 kg (173 lb 11.2 oz) 78.2 kg (172 lb 6.4 oz)   Body mass index is 28.69 kg/(m^2).   Gen Exam: Awake and alert with clear speech.   Neck: Supple, No JVD.   Chest: Few bibasilar rales-but mostly clear CVS: S1 S2 Regular, no murmurs.  Abdomen: soft, BS +, non tender, non distended.  Extremities:trace edema, lower extremities warm to touch. Neurologic:  Non Focal.  Skin: No Rash.   Wounds: N/A.    Intake/Output from previous day:  Intake/Output Summary (Last 24 hours) at 08/07/15 1041 Last data filed at 08/07/15 0919  Gross per 24 hour  Intake   1320 ml  Output   2450 ml  Net  -1130 ml     LAB RESULTS: CBC  Recent Labs Lab 08/02/15 0352 08/07/15 0315  WBC 8.5 7.0  HGB 10.5* 11.1*  HCT  32.3* 34.1*  PLT 234 258  MCV 101.9* 102.1*  MCH 33.1 33.2  MCHC 32.5 32.6  RDW 13.3 13.0    Chemistries   Recent Labs Lab 08/02/15 0352 08/03/15 0552 08/04/15 0500 08/05/15 0359 08/06/15 0342 08/07/15 0315  NA 135 135 136 134* 135 135  K 3.7 4.2 3.3* 3.2* 3.3* 3.0*  CL 93* 88* 87* 84* 83* 83*  CO2 30 36* 37* 37* 40* 41*  GLUCOSE 124* 112* 105* 113* 116* 98  BUN 27* 28* 30* 33* 38* 37*  CREATININE 1.15* 1.23* 1.09* 1.16* 1.20* 1.15*  CALCIUM 9.4 9.4 9.6 9.5 9.5 9.5  MG 1.8 1.9  --   --   --   --     CBG: No results for input(s): GLUCAP in the last 168 hours.  GFR Estimated Creatinine Clearance: 42.4 mL/min (by C-G formula based on Cr of 1.15).  Coagulation profile No results for input(s): INR, PROTIME in the last 168 hours.  Cardiac Enzymes  Recent Labs Lab 07/31/15 1735 07/31/15 2256  TROPONINI 0.03 0.04*    Invalid input(s): POCBNP No results for input(s): DDIMER in the last 72 hours. No results for input(s): HGBA1C in the last 72 hours. No results for input(s): CHOL, HDL, LDLCALC, TRIG, CHOLHDL, LDLDIRECT in the last 72 hours. No results for input(s): TSH, T4TOTAL, T3FREE, THYROIDAB in the last 72 hours.  Invalid input(s): FREET3 No results for input(s): VITAMINB12, FOLATE, FERRITIN, TIBC, IRON, RETICCTPCT in the last 72 hours. No results for input(s): LIPASE, AMYLASE in the last 72 hours.  Urine Studies No results for input(s): UHGB, CRYS in the last 72 hours.  Invalid input(s): UACOL, UAPR, USPG, UPH, UTP, UGL, UKET, UBIL, UNIT, UROB, ULEU, UEPI, UWBC, URBC, UBAC, CAST, UCOM, BILUA  MICROBIOLOGY: Recent Results (from the past 240 hour(s))  Urine Culture     Status: None   Collection Time: 07/28/15  5:48 PM  Result Value Ref Range Status   Colony Count >=100,000 COLONIES/ML  Final   Organism ID, Bacteria Multiple bacterial morphotypes present, none  Final   Organism ID, Bacteria predominant. Suggest appropriate recollection if   Final    Organism ID, Bacteria clinically indicated.  Final  Urine culture     Status: None   Collection Time: 08/01/15  4:31 PM  Result Value Ref Range Status   Specimen Description URINE, CLEAN CATCH  Final   Special Requests NONE  Final   Culture MULTIPLE SPECIES PRESENT, SUGGEST RECOLLECTION  Final   Report Status 08/03/2015 FINAL  Final    RADIOLOGY STUDIES/RESULTS: Dg Chest 2 View  08/04/2015   CLINICAL DATA:  Shortness of breath, low oxygen saturation  EXAM: CHEST  2 VIEW  COMPARISON:  07/30/2015  FINDINGS: There is stable cardiomegaly. There are trace bilateral pleural effusions. There is bilateral diffuse interstitial thickening. There is no pneumothorax. There is no acute osseous abnormality.  IMPRESSION: Findings most consistent with mild CHF.   Electronically Signed   By: Kathreen Devoid   On: 08/04/2015 12:36   Dg Chest 2  View  07/22/2015   CLINICAL DATA:  Shortness of breath, dry cough.  EXAM: CHEST  2 VIEW  COMPARISON:  02/20/2015  FINDINGS: There is mild cardiomegaly and vascular congestion. No overt edema. No confluent opacities or effusions. No acute bony abnormality.  IMPRESSION: Cardiomegaly, vascular congestion.   Electronically Signed   By: Rolm Baptise M.D.   On: 07/22/2015 18:48   Ct Angio Chest Pe W/cm &/or Wo Cm  07/28/2015   CLINICAL DATA:  Shortness breath for 2 weeks. Personal history of smoking. The patient quit smoking 15 years ago.  EXAM: CT ANGIOGRAPHY CHEST WITH CONTRAST  TECHNIQUE: Multidetector CT imaging of the chest was performed using the standard protocol during bolus administration of intravenous contrast. Multiplanar CT image reconstructions and MIPs were obtained to evaluate the vascular anatomy.  CONTRAST:  188mL OMNIPAQUE IOHEXOL 350 MG/ML SOLN  COMPARISON:  Two-view chest x-ray 07/22/2015.  FINDINGS: Pulmonary arterial opacification is satisfactory. The study is mildly degraded by patient breathing motion. No focal filling defects are evident to suggest pulmonary  emboli.  The heart is mildly enlarged. Coronary artery calcifications are present. There is no significant pericardial effusion.  A precarinal lymph node measures 1.2 cm in short axis. Subcentimeter prevascular lymph nodes are present. No other pathologically enlarged nodes are present. No significant axillary nodes are present.  Small moderate bilateral pleural effusions are present. There is some associated atelectasis.  Diffuse ground-glass attenuation is present throughout both lungs, likely representing edema. No significant airspace consolidation is present.  The bone windows demonstrate exaggerated kyphosis in the lower thoracic spine. No focal lytic or blastic lesions are present.  Review of the MIP images confirms the above findings.  IMPRESSION: 1. No evidence for pulmonary embolus. 2. Cardiomegaly, interstitial edema, and bilateral pleural effusions are compatible with congestive heart failure. 3. No focal airspace opacification other than dependent atelectasis. 4. Enlarged precarinal lymph node. Other smaller mediastinal nodes are present as well. These are likely reactive in the setting of congestive heart failure. These results will be called to the ordering clinician or representative by the Radiologist Assistant, and communication documented in the PACS or zVision Dashboard.   Electronically Signed   By: San Morelle M.D.   On: 07/28/2015 11:15   Dg Chest Port 1 View  08/07/2015   CLINICAL DATA:  Shortness of Breath  EXAM: PORTABLE CHEST - 1 VIEW  COMPARISON:  08/04/2015  FINDINGS: Cardiac shadow remains enlarged. Prominent vascular congestion is again right greater than left with interstitial changes changes have progressed slightly in the interval from the prior exam although some of this may be technically related. Aortic calcifications are again seen. The bony structures are stable.  IMPRESSION: Changes consistent with CHF which have worsened slightly in the interval from the prior  exam.   Electronically Signed   By: Inez Catalina M.D.   On: 08/07/2015 07:39   Dg Chest Port 1 View  07/30/2015   CLINICAL DATA:  Patient with shortness of breath since last Saturday. Recent diagnosis congestive heart failure.  EXAM: PORTABLE CHEST - 1 VIEW  COMPARISON:  Chest CT 07/28/2015; chest radiograph 07/22/2015  FINDINGS: Monitoring leads overlie the patient. Cardiomegaly. Moderate left and small right pleural effusions. Bilateral perihilar interstitial pulmonary opacities. No pneumothorax.  IMPRESSION: Cardiomegaly with increasing interstitial opacities concerning for pulmonary edema. Moderate left and small right pleural effusions.   Electronically Signed   By: Lovey Newcomer M.D.   On: 07/30/2015 21:18    Oren Binet, MD  [facility]  Pager:[phone]  If 7PM-7AM, please contact night-coverage [email or web address] Password [email or web address] 08/07/2015, 10:41 AM   LOS: 8 days

## 2015-08-08 ENCOUNTER — Ambulatory Visit: Payer: Medicare Other | Admitting: Cardiovascular Disease

## 2015-08-08 ENCOUNTER — Inpatient Hospital Stay (HOSPITAL_COMMUNITY): Payer: Medicare Other

## 2015-08-08 DIAGNOSIS — J9621 Acute and chronic respiratory failure with hypoxia: Secondary | ICD-10-CM

## 2015-08-08 DIAGNOSIS — E039 Hypothyroidism, unspecified: Secondary | ICD-10-CM

## 2015-08-08 DIAGNOSIS — J9601 Acute respiratory failure with hypoxia: Secondary | ICD-10-CM

## 2015-08-08 DIAGNOSIS — R0600 Dyspnea, unspecified: Secondary | ICD-10-CM

## 2015-08-08 LAB — BASIC METABOLIC PANEL
ANION GAP: 11 (ref 5–15)
BUN: 35 mg/dL — ABNORMAL HIGH (ref 6–20)
CALCIUM: 9.3 mg/dL (ref 8.9–10.3)
CO2: 41 mmol/L — AB (ref 22–32)
Chloride: 82 mmol/L — ABNORMAL LOW (ref 101–111)
Creatinine, Ser: 1.17 mg/dL — ABNORMAL HIGH (ref 0.44–1.00)
GFR, EST AFRICAN AMERICAN: 51 mL/min — AB (ref >60)
GFR, EST NON AFRICAN AMERICAN: 44 mL/min — AB (ref >60)
Glucose, Bld: 120 mg/dL — ABNORMAL HIGH (ref 65–99)
Potassium: 3.2 mmol/L — ABNORMAL LOW (ref 3.5–5.1)
SODIUM: 134 mmol/L — AB (ref 135–145)

## 2015-08-08 LAB — RHEUMATOID FACTOR: Rhuematoid fact SerPl-aCnc: 57.8 IU/mL — ABNORMAL HIGH (ref 0.0–13.9)

## 2015-08-08 LAB — HIV ANTIBODY (ROUTINE TESTING W REFLEX): HIV Screen 4th Generation wRfx: NONREACTIVE

## 2015-08-08 MED ORDER — SODIUM CHLORIDE 0.9 % IV SOLN
Freq: Once | INTRAVENOUS | Status: AC
  Administered 2015-08-08: 13:00:00 via INTRAVENOUS

## 2015-08-08 MED ORDER — FUROSEMIDE 10 MG/ML IJ SOLN
80.0000 mg | Freq: Two times a day (BID) | INTRAMUSCULAR | Status: DC
  Administered 2015-08-08 – 2015-08-09 (×3): 80 mg via INTRAVENOUS
  Filled 2015-08-08 (×6): qty 8

## 2015-08-08 MED ORDER — POTASSIUM CHLORIDE CRYS ER 20 MEQ PO TBCR
40.0000 meq | EXTENDED_RELEASE_TABLET | Freq: Two times a day (BID) | ORAL | Status: DC
  Administered 2015-08-08 – 2015-08-10 (×5): 40 meq via ORAL
  Filled 2015-08-08: qty 2
  Filled 2015-08-08: qty 4
  Filled 2015-08-08 (×6): qty 2

## 2015-08-08 NOTE — Progress Notes (Signed)
Home oxygen ordered as requested. B Javonnie Illescas RN,MHA,BSN [phone] 

## 2015-08-08 NOTE — Progress Notes (Signed)
PULMONARY / CRITICAL CARE MEDICINE   Name: Colleen [name] MRN: [id number] DOB: [birth year]   PCP Garnet Koyanagi, DO   ADMISSION DATE:  07/30/2015 CONSULTATION DATE:  08/07/15 LOS 80 das   REFERRING MD :  Dr Lovena Neighbours  CHIEF COMPLAINT:  Dyspnea, hypoxemia     HISTORY OF PRESENT ILLNESS:    80 year old female. Consult for dyspnea. This is associated with hypoxemia. History is obtained from the patient, review of the chart and in discussion with the referring hospitalist. Patient is ex heavy 40 pack per day smoker quit 15 years ago but without any residual dyspnea or oxygen use other than extremely mild dyspnea for heavy exertion. She's always been active with her activities of daily living. In February/March 2016 she had an admission for atypical chest pain. Myoview stress test was normal Approximately 3 weeks ago she decompensated with worsening dyspnea and was admitted to the hospital. In addition she says she had an admission in end of July 2016 presumably to this hospital and apparently she was diuresed and discharged. I do not see evidence of this hospitalization in our computer records. Within a week of discharge she had worsening edema and shortness of breath and associated hypoxemia and was admitted. This admission was on 07/30/2015. CT scan of the chest just prior to that on 07/28/2015 shows evidence of pulmonary congestion with bilateral pleural effusion [film was personally visualized]. According to the hospital she's been aggressively diuresed with no improvement in hypoxemia. She is now dependent on oxygen. According to the patient she still continues to be dyspneic but is significantly "at least 80%" improved since admission but she's not anywhere near her baseline. At admission over a week ago she was dyspneic even at rest but now she is able to go to the toilet and come back but she still does need oxygen. Pulmonary has been consulted for any pulmonary causes for dyspnea and  hypoxemia  Repeat CT scan high-resolution's of the chest today without contrast shows significant improvement compared to August 2016 based on personal review of the image. There is still some nonspecific infiltrates bilaterally consistent with a groundglass pattern. Formal report is still pending. Effusions are resolved. There is also some emphysema in the upper lobes    dyspnea and hypoxemia related workup  - Echo and Myoview in February 2011 are normal --Myoview March 2016 with a left ventricular ejection fraction of 73% low risk findings  -Ex heavy smoker 40 pack 15 years ago  - Pulmonary embolism ruled out 07/28/2015 CT angiogram chest  - DVT ruled out on Doppler ultrasound leg 08/06/2015    VITAL SIGNS: Temp:  [97.8 F (36.6 C)-98.4 F (36.9 C)] 97.8 F (36.6 C) (08/16 0542) Pulse Rate:  [54-58] 58 (08/16 0944) Resp:  [18-20] 18 (08/16 0542) BP: (124-145)/(39-61) 140/39 mmHg (08/16 0944) SpO2:  [92 %-96 %] 95 % (08/16 0818) Weight:  [171 lb 1.6 oz (77.61 kg)] 171 lb 1.6 oz (77.61 kg) (08/16 0542) HEMODYNAMICS:   VENTILATOR SETTINGS:   INTAKE / OUTPUT:  Intake/Output Summary (Last 24 hours) at 08/08/15 1000 Last data filed at 08/08/15 0945  Gross per 24 hour  Intake    955 ml  Output   2200 ml  Net  -1245 ml    PHYSICAL EXAMINATION: General:  mildly obese lady. Sitting comfortable. Looks mildly deconditioned  Neuro:  Alert and oriented 3. Speech is normal. HEENT:  Mallampati class II airway. No neck nodes. Pupils equal and reactive to light. No elevated JVP. Oxygen  on CardiovasculRegular rate and rhythm. No murmurs Lung: CTA Abdomen:  Soft nontender no organomegaly Musuloskeletal:  no cyanosis no clubbing no edema  Skin:  Intact LABS:  CBC  Recent Labs Lab 08/02/15 0352 08/07/15 0315  WBC 8.5 7.0  HGB 10.5* 11.1*  HCT 32.3* 34.1*  PLT 234 258   Coag's No results for input(s): APTT, INR in the last 168 hours. BMET  Recent Labs Lab 08/06/15 0342  08/07/15 0315 08/08/15 0356  NA 135 135 134*  K 3.3* 3.0* 3.2*  CL 83* 83* 82*  CO2 40* 41* 41*  BUN 38* 37* 35*  CREATININE 1.20* 1.15* 1.17*  GLUCOSE 116* 98 120*   Electrolytes  Recent Labs Lab 08/02/15 0352 08/03/15 0552  08/06/15 0342 08/07/15 0315 08/08/15 0356  CALCIUM 9.4 9.4  < > 9.5 9.5 9.3  MG 1.8 1.9  --   --   --   --   < > = values in this interval not displayed. Sepsis Markers  Recent Labs Lab 08/07/15 1222  PROCALCITON <0.10   ABG No results for input(s): PHART, PCO2ART, PO2ART in the last 168 hours. Liver Enzymes No results for input(s): AST, ALT, ALKPHOS, BILITOT, ALBUMIN in the last 168 hours. Cardiac Enzymes No results for input(s): TROPONINI, PROBNP in the last 168 hours. Glucose No results for input(s): GLUCAP in the last 168 hours.  Imaging Ct Chest High Resolution  08/07/2015   CLINICAL DATA:  80 year old female with history of diastolic congestive heart failure. Dyspnea on exertion. Weight gain. Orthopnea new increasing edema. History of asbestos exposure.  EXAM: CT CHEST WITHOUT CONTRAST  TECHNIQUE: Multidetector CT imaging of the chest was performed following the standard protocol without intravenous contrast. High resolution imaging of the lungs, as well as inspiratory and expiratory imaging, was performed.  COMPARISON:  Chest CT 07/28/2015.  FINDINGS: Mediastinum/Lymph Nodes: Heart size is mildly enlarged. There is no significant pericardial fluid, thickening or pericardial calcification. There is atherosclerosis of the thoracic aorta, the great vessels of the mediastinum and the coronary arteries, including calcified atherosclerotic plaque in the left anterior descending and right coronary arteries. Calcifications of the mitral annulus. Calcifications of the aortic valve. Multiple prominent borderline enlarged and mildly enlarged mediastinal lymph nodes, measuring up to 12 mm in short axis in the low right paratracheal station (unchanged).  Esophagus is unremarkable in appearance. No axillary lymphadenopathy.  Lungs/Pleura: Compared to the prior examination from 07/28/2015 there are multiple nodular and mass-like areas of peribronchovascular airspace consolidation now noted in the lower lobes of the lungs bilaterally, presumably infectious or inflammatory in etiology given their rapid development. There are additional patchy areas of ground-glass attenuation scattered throughout the lungs bilaterally, most confluent in the apex of the right upper lobe. Background of mild interlobular septal thickening, suggestive of a background of mild interstitial pulmonary edema. Previously noted small bilateral pleural effusions have nearly completely resolved (trace bilateral pleural effusions are noted on today's examination). Evaluation of high-resolution images is limited given the acute findings on today's examination. There are some areas of mild subpleural reticulation, however, this could simply be related to underlying edema. No calcified pleural plaques. Inspiratory and expiratory imaging demonstrates extensive air trapping, indicative of small airways disease.  Upper Abdomen: Status post cholecystectomy. Slightly nodular contour of the liver, could indicate early changes of cirrhosis. Atherosclerosis.  Musculoskeletal/Soft Tissues: There are no aggressive appearing lytic or blastic lesions noted in the visualized portions of the skeleton.  IMPRESSION: 1. Assessment for underlying interstitial lung disease is  significantly limited on today's examination secondary to multiple acute findings, as discussed above. No overt changes suggestive of interstitial lung disease are confidently identified. If there continues to be clinical concern for interstitial lung disease, follow-up outpatient evaluation with high-resolution chest CT in 6-12 months would be recommended. 2. Interval development of multifocal peribronchovascular nodular and mass-like appearing  airspace consolidation most evident in the lower lobes of the lungs bilaterally (left greater than right), presumably infectious or inflammatory in etiology. 3. Extensive air trapping, indicative of small airways disease. 4. No calcified pleural plaques to suggest asbestos related pleural disease. 5. Mild diffuse interlobular septal thickening, likely to reflect a background of mild interstitial pulmonary edema. Small bilateral pleural effusions have nearly completely resolved compared to the prior examination. 6. Mild cardiomegaly. 7. Atherosclerosis, including 2 vessel coronary artery disease. Assessment for potential risk factor modification, dietary therapy or pharmacologic therapy may be warranted, if clinically indicated. 8. Additional incidental findings, as above.   Electronically Signed   By: Vinnie Langton M.D.   On: 08/07/2015 12:29     ASSESSMENT / PLAN:  PULMONARY  A: acute on chronic hypoxemic respiratory failure - class III dyspnea Requiring 2-3 L nasal cannula oxygen    - Clearly the first component was diastolic heart failure. Based on her history and history of diuresis and objective improvement on CT scan of the chest she is significantly better. The issue is that of residual dyspnea and hypoxemia. At this point in time this can best be explained by rest with infiltrates and also some emphysema. There might be some underlying pulmonary hypertension but this is unclear at this point  - Etiology of the infiltrates could be viral or spontaneous clearing of HCAP or BOOP/ILD  P:  - Get spirometry to document presence of obstructive lung disease and severity if any  - Empiric Spiriva inhaler treatment - Get autoimmune profile and sedimentation rate(92) RA 57.8)-  would like to figure out if she needs  steroid therapy systemic  -Oxygen therapy for pulse ox greater than 88% -Consider FOB for examination possible bx.  - Regardless of the etiology of infiltrates it appears that the  presence of these infiltrates suggest that it might take few to several weeks for them to resolve and it is possible that she may need to go home with oxygen therapy        Richardson Landry Lakendra Helling ACNP Maryanna Shape PCCM Pager [phone] till 3 pm If no answer page [phone] 08/08/2015, 10:01 AM

## 2015-08-08 NOTE — Progress Notes (Signed)
Physical Therapy Treatment Patient Details Name: Colleen [name] MRN: [id number] DOB: April 08, 1935 Today's Date: 08/08/2015    History of Present Illness 80 y.o. female with recent diagnosis of Diastolic CHF who presents to the ED with complaints of worsening SOB, DOE, Orthopnea and Edema of both lower legs over a 1 week period.    PT Comments    Patient seated EOB, eager to participate in PT today. Patient was able to ambulate and transfer as described below. See O2 saturation note and Vitals for sats during ambulation. Patient reports she will walk with nursing staff to continue exercise when not being seen by PT. Patient will benefit from continued PT to address ambulatory endurance and independence to return her to her independent lifestyle.  Follow Up Recommendations  No PT follow up     Equipment Recommendations  Other (comment) (Home oxygen)    Recommendations for Other Services       Precautions / Restrictions Precautions Precautions: Fall Restrictions Weight Bearing Restrictions: No    Mobility  Bed Mobility               General bed mobility comments: Sitting EOB on PT arrival.  Transfers Overall transfer level: Modified independent               General transfer comment: Patient able to transfer with no physical assitance.  Ambulation/Gait Ambulation/Gait assistance: Supervision Ambulation Distance (Feet): 200 Feet Assistive device: None Gait Pattern/deviations: Step-through pattern;Narrow base of support;Drifts right/left Gait velocity: Decreased Gait velocity interpretation: Below normal speed for age/gender General Gait Details: Monitored O2 throughout ambulation, mod VC's to breathe through nose to utilize O2. Patient reports feeling "about 88%" of her baseline abilities, reports baseline minimally staggering gait but no LOB noted. She did use furniture in the room to help navigate, needed no physical assistance during ambulation in the  hallway.   Stairs            Wheelchair Mobility    Modified Rankin (Stroke Patients Only)       Balance Overall balance assessment: No apparent balance deficits (not formally assessed)                                  Cognition Arousal/Alertness: Awake/alert Behavior During Therapy: WFL for tasks assessed/performed Overall Cognitive Status: Within Functional Limits for tasks assessed                      Exercises      General Comments        Pertinent Vitals/Pain Pain Assessment: No/denies pain  Patient Saturations on Room Air at Rest, standing = 83-84%  Patient Saturations on Room Air while Ambulating = not completed due to low sats during rest  Patient Saturations on 2 Liters of oxygen while Ambulating = 88-90%  Patient Saturations on 2 Liters of oxygen at rest = 95%    Home Living                      Prior Function            PT Goals (current goals can now be found in the care plan section) Acute Rehab PT Goals Patient Stated Goal: to get home PT Goal Formulation: With patient Time For Goal Achievement: 08/16/15 Potential to Achieve Goals: Good Progress towards PT goals: Progressing toward goals    Frequency  Min 3X/week  PT Plan Current plan remains appropriate    Co-evaluation             End of Session Equipment Utilized During Treatment: Gait belt;Oxygen Activity Tolerance: Patient tolerated treatment well Patient left: in chair;with call bell/phone within reach     Time: 0905-0924 PT Time Calculation (min) (ACUTE ONLY): 19 min  Charges:  $Gait Training: 8-22 mins                    G CodesRoanna Epley, SPT [phone]  08/08/2015, 11:46 AM  I have read, reviewed and agree with student's note.   [place] [phone] (pager)

## 2015-08-08 NOTE — Progress Notes (Signed)
PATIENT DETAILS Name: Colleen [name] Age: 80 y.o. Sex: female Date of Birth: 11/17/1935 Admit Date: 07/30/2015 Admitting Physician Theressa Millard, MD EHU:[id number] [name], DO  Subjective: Continued shortness of breath on ambulation. No leg edema. Denies chest pain or fever.  Assessment/Plan: Principal Problem: Acute hypoxic respiratory failure: Likely etiology felt to be acute diastolic heart failure, however despite of being below dry weight and significant diuresis, continues to have worsening x-ray changes along with hypoxia on ambulation. CTA chest and lower extremity Dopplers negative for venous thromboembolism. PCCM attribute the residual dyspnea and hypoxemia to pulmonary infiltrates and some emphysema, possibly some underlying pulmonary hypertension as well. Cause of infiltrates could be viral or spontaneous clearing of HCAP or BOOP/ILD which might take several weeks to resolve; patient will likely need to go home with oxygen therapy. PCCM is following.  Acute diastolic CHF: Improved with IV Lasix and metolazone, weight decreased to 171 pounds (190 on admit), negative balance of 9.5 L. Has no other signs of decompensation, still hypoxic in spite of significant diuresis. PCCM is following. 8/15 CT showed mild pulmonary edema and CXR with slightly worse CHF. Even though patient is likely near dry weight, continue IV diuretics due to some evidence of mild CHF on imaging yesterday per Cardiology.  Sinus bradycardia: Heart rate is stable in the 50s, continue to monitor off beta blockers. History of Mobitz type II AV block as well as junctional bradycardia which is likely not contributing to her SOB per cardiology.  ARF: Suspect secondary to CHF/diuretics. Creatinine is 1.17. Resume Prinzide when creatinine is normalized.   Hypokalemia: K has bumped up to 3.2 from 3.0 yesterday. Continue to replenish, add on K-dur per IM.   Hyponatremia: Likely secondary to hypervolemia,  improved with IV Lasix.  Hypertension: BP is currently well controlled with IV Lasix. Continue to hold lisinopril/HCTZ. Resume home Prinzide when creatinine is normalized.   Hypothyroidism: Continue with Synthroid  Hyperlipidemia: Resume niacin on discharge  Disposition: Remain inpatient - discharge likely in 1-2 days with supplemental O2  Antimicrobial agents  See below  Anti-infectives    None      DVT Prophylaxis: Prophylactic Lovenox   Code Status: Full code   Family Communication None at bedside  Procedures: None  CONSULTS:  cardiology  Time spent 25  minutes-Greater than 50% of this time was spent in counseling, explanation of diagnosis, planning of further management, and coordination of care.  MEDICATIONS: Scheduled Meds: . enoxaparin (LOVENOX) injection  40 mg Subcutaneous Q24H  . furosemide  80 mg Intravenous BID  . levothyroxine  100 mcg Oral QAC breakfast  . metolazone  2.5 mg Oral Daily  . potassium chloride  40 mEq Oral BID  . sodium chloride  3 mL Intravenous Q12H  . tiotropium  18 mcg Inhalation Daily   Continuous Infusions:  PRN Meds:.sodium chloride, acetaminophen, calcium carbonate, ondansetron (ZOFRAN) IV, sodium chloride    PHYSICAL EXAM: Vital signs in last 24 hours: Filed Vitals:   08/07/15 2108 08/08/15 0542 08/08/15 0818 08/08/15 0944  BP: 135/56 130/52  140/39  Pulse: 56 54  58  Temp: 98.1 F (36.7 C) 97.8 F (36.6 C)    TempSrc: Oral Oral    Resp: 18 18    Height:      Weight:  77.61 kg (171 lb 1.6 oz)    SpO2: 95% 96% 95%     Weight change:  [employer]  08/06/15 0520 08/07/15 0705 08/08/15 0542  Weight: 78.79 kg (173 lb 11.2 oz) 78.2 kg (172 lb 6.4 oz) 77.61 kg (171 lb 1.6 oz)   Body mass index is 28.47 kg/(m^2).   Gen Exam: Awake and alert with clear speech.   Neck: Supple, No JVD.   Chest: Few bibasilar rales-but mostly clear CVS: S1 S2 Regular, no murmurs.  Abdomen: soft, BS +, non tender, non  distended.  Extremities:trace edema, lower extremities warm to touch. Neurologic: Non Focal.  Skin: No Rash.   Wounds: N/A.    Intake/Output from previous day:  Intake/Output Summary (Last 24 hours) at 08/08/15 1013 Last data filed at 08/08/15 0945  Gross per 24 hour  Intake    955 ml  Output   2200 ml  Net  -1245 ml     LAB RESULTS: CBC  Recent Labs Lab 08/02/15 0352 08/07/15 0315  WBC 8.5 7.0  HGB 10.5* 11.1*  HCT 32.3* 34.1*  PLT 234 258  MCV 101.9* 102.1*  MCH 33.1 33.2  MCHC 32.5 32.6  RDW 13.3 13.0    Chemistries   Recent Labs Lab 08/02/15 0352 08/03/15 0552 08/04/15 0500 08/05/15 0359 08/06/15 0342 08/07/15 0315 08/08/15 0356  NA 135 135 136 134* 135 135 134*  K 3.7 4.2 3.3* 3.2* 3.3* 3.0* 3.2*  CL 93* 88* 87* 84* 83* 83* 82*  CO2 30 36* 37* 37* 40* 41* 41*  GLUCOSE 124* 112* 105* 113* 116* 98 120*  BUN 27* 28* 30* 33* 38* 37* 35*  CREATININE 1.15* 1.23* 1.09* 1.16* 1.20* 1.15* 1.17*  CALCIUM 9.4 9.4 9.6 9.5 9.5 9.5 9.3  MG 1.8 1.9  --   --   --   --   --     GFR Estimated Creatinine Clearance: 41.4 mL/min (by C-G formula based on Cr of 1.17).  MICROBIOLOGY: Recent Results (from the past 240 hour(s))  Urine culture     Status: None   Collection Time: 08/01/15  4:31 PM  Result Value Ref Range Status   Specimen Description URINE, CLEAN CATCH  Final   Special Requests NONE  Final   Culture MULTIPLE SPECIES PRESENT, SUGGEST RECOLLECTION  Final   Report Status 08/03/2015 FINAL  Final    RADIOLOGY STUDIES/RESULTS: Dg Chest 2 View  08/04/2015   CLINICAL DATA:  Shortness of breath, low oxygen saturation  EXAM: CHEST  2 VIEW  COMPARISON:  07/30/2015  FINDINGS: There is stable cardiomegaly. There are trace bilateral pleural effusions. There is bilateral diffuse interstitial thickening. There is no pneumothorax. There is no acute osseous abnormality.  IMPRESSION: Findings most consistent with mild CHF.   Electronically Signed   By: Kathreen Devoid    On: 08/04/2015 12:36   Dg Chest 2 View  07/22/2015   CLINICAL DATA:  Shortness of breath, dry cough.  EXAM: CHEST  2 VIEW  COMPARISON:  02/20/2015  FINDINGS: There is mild cardiomegaly and vascular congestion. No overt edema. No confluent opacities or effusions. No acute bony abnormality.  IMPRESSION: Cardiomegaly, vascular congestion.   Electronically Signed   By: Rolm Baptise M.D.   On: 07/22/2015 18:48   Ct Angio Chest Pe W/cm &/or Wo Cm  07/28/2015   CLINICAL DATA:  Shortness breath for 2 weeks. Personal history of smoking. The patient quit smoking 15 years ago.  EXAM: CT ANGIOGRAPHY CHEST WITH CONTRAST  TECHNIQUE: Multidetector CT imaging of the chest was performed using the standard protocol during bolus administration of intravenous contrast. Multiplanar CT image reconstructions  and MIPs were obtained to evaluate the vascular anatomy.  CONTRAST:  153mL OMNIPAQUE IOHEXOL 350 MG/ML SOLN  COMPARISON:  Two-view chest x-ray 07/22/2015.  FINDINGS: Pulmonary arterial opacification is satisfactory. The study is mildly degraded by patient breathing motion. No focal filling defects are evident to suggest pulmonary emboli.  The heart is mildly enlarged. Coronary artery calcifications are present. There is no significant pericardial effusion.  A precarinal lymph node measures 1.2 cm in short axis. Subcentimeter prevascular lymph nodes are present. No other pathologically enlarged nodes are present. No significant axillary nodes are present.  Small moderate bilateral pleural effusions are present. There is some associated atelectasis.  Diffuse ground-glass attenuation is present throughout both lungs, likely representing edema. No significant airspace consolidation is present.  The bone windows demonstrate exaggerated kyphosis in the lower thoracic spine. No focal lytic or blastic lesions are present.  Review of the MIP images confirms the above findings.  IMPRESSION: 1. No evidence for pulmonary embolus. 2.  Cardiomegaly, interstitial edema, and bilateral pleural effusions are compatible with congestive heart failure. 3. No focal airspace opacification other than dependent atelectasis. 4. Enlarged precarinal lymph node. Other smaller mediastinal nodes are present as well. These are likely reactive in the setting of congestive heart failure. These results will be called to the ordering clinician or representative by the Radiologist Assistant, and communication documented in the PACS or zVision Dashboard.   Electronically Signed   By: San Morelle M.D.   On: 07/28/2015 11:15   Ct Chest High Resolution  08/07/2015   CLINICAL DATA:  80 year old female with history of diastolic congestive heart failure. Dyspnea on exertion. Weight gain. Orthopnea new increasing edema. History of asbestos exposure.  EXAM: CT CHEST WITHOUT CONTRAST  TECHNIQUE: Multidetector CT imaging of the chest was performed following the standard protocol without intravenous contrast. High resolution imaging of the lungs, as well as inspiratory and expiratory imaging, was performed.  COMPARISON:  Chest CT 07/28/2015.  FINDINGS: Mediastinum/Lymph Nodes: Heart size is mildly enlarged. There is no significant pericardial fluid, thickening or pericardial calcification. There is atherosclerosis of the thoracic aorta, the great vessels of the mediastinum and the coronary arteries, including calcified atherosclerotic plaque in the left anterior descending and right coronary arteries. Calcifications of the mitral annulus. Calcifications of the aortic valve. Multiple prominent borderline enlarged and mildly enlarged mediastinal lymph nodes, measuring up to 12 mm in short axis in the low right paratracheal station (unchanged). Esophagus is unremarkable in appearance. No axillary lymphadenopathy.  Lungs/Pleura: Compared to the prior examination from 07/28/2015 there are multiple nodular and mass-like areas of peribronchovascular airspace consolidation now  noted in the lower lobes of the lungs bilaterally, presumably infectious or inflammatory in etiology given their rapid development. There are additional patchy areas of ground-glass attenuation scattered throughout the lungs bilaterally, most confluent in the apex of the right upper lobe. Background of mild interlobular septal thickening, suggestive of a background of mild interstitial pulmonary edema. Previously noted small bilateral pleural effusions have nearly completely resolved (trace bilateral pleural effusions are noted on today's examination). Evaluation of high-resolution images is limited given the acute findings on today's examination. There are some areas of mild subpleural reticulation, however, this could simply be related to underlying edema. No calcified pleural plaques. Inspiratory and expiratory imaging demonstrates extensive air trapping, indicative of small airways disease.  Upper Abdomen: Status post cholecystectomy. Slightly nodular contour of the liver, could indicate early changes of cirrhosis. Atherosclerosis.  Musculoskeletal/Soft Tissues: There are no aggressive appearing lytic or  blastic lesions noted in the visualized portions of the skeleton.  IMPRESSION: 1. Assessment for underlying interstitial lung disease is significantly limited on today's examination secondary to multiple acute findings, as discussed above. No overt changes suggestive of interstitial lung disease are confidently identified. If there continues to be clinical concern for interstitial lung disease, follow-up outpatient evaluation with high-resolution chest CT in 6-12 months would be recommended. 2. Interval development of multifocal peribronchovascular nodular and mass-like appearing airspace consolidation most evident in the lower lobes of the lungs bilaterally (left greater than right), presumably infectious or inflammatory in etiology. 3. Extensive air trapping, indicative of small airways disease. 4. No  calcified pleural plaques to suggest asbestos related pleural disease. 5. Mild diffuse interlobular septal thickening, likely to reflect a background of mild interstitial pulmonary edema. Small bilateral pleural effusions have nearly completely resolved compared to the prior examination. 6. Mild cardiomegaly. 7. Atherosclerosis, including 2 vessel coronary artery disease. Assessment for potential risk factor modification, dietary therapy or pharmacologic therapy may be warranted, if clinically indicated. 8. Additional incidental findings, as above.   Electronically Signed   By: Vinnie Langton M.D.   On: 08/07/2015 12:29   Dg Chest Port 1 View  08/07/2015   CLINICAL DATA:  Shortness of Breath  EXAM: PORTABLE CHEST - 1 VIEW  COMPARISON:  08/04/2015  FINDINGS: Cardiac shadow remains enlarged. Prominent vascular congestion is again right greater than left with interstitial changes changes have progressed slightly in the interval from the prior exam although some of this may be technically related. Aortic calcifications are again seen. The bony structures are stable.  IMPRESSION: Changes consistent with CHF which have worsened slightly in the interval from the prior exam.   Electronically Signed   By: Inez Catalina M.D.   On: 08/07/2015 07:39   Dg Chest Port 1 View  07/30/2015   CLINICAL DATA:  Patient with shortness of breath since last Saturday. Recent diagnosis congestive heart failure.  EXAM: PORTABLE CHEST - 1 VIEW  COMPARISON:  Chest CT 07/28/2015; chest radiograph 07/22/2015  FINDINGS: Monitoring leads overlie the patient. Cardiomegaly. Moderate left and small right pleural effusions. Bilateral perihilar interstitial pulmonary opacities. No pneumothorax.  IMPRESSION: Cardiomegaly with increasing interstitial opacities concerning for pulmonary edema. Moderate left and small right pleural effusions.   Electronically Signed   By: Lovey Newcomer M.D.   On: 07/30/2015 21:18    Pharell Rolfson L. Guerry Bruin, PA-S  Oren Binet, MD [facility] Pager:[phone]  If 7PM-7AM, please contact night-coverage [email or web address] Password [email or web address] 08/08/2015, 10:13 AM   LOS: 9 days

## 2015-08-08 NOTE — Progress Notes (Signed)
SATURATION QUALIFICATIONS: (This note is used to comply with regulatory documentation for home oxygen)  Patient Saturations on Room Air at Rest, standing = 83-84%  Patient Saturations on Room Air while Ambulating = not completed due to low sats during rest  Patient Saturations on 2 Liters of oxygen while Ambulating = 88-90%  Patient Saturations on 2 Liters of oxygen at rest = 95%  Please briefly explain why patient needs home oxygen: At this time, patient requires O2 for sats to remain close to 90% during ambulation and >90% at rest. Thanks,  [name], SPT [phone] I have read, reviewed and agree with student's note.   [place] [phone] (pager)

## 2015-08-08 NOTE — Progress Notes (Signed)
Echocardiogram 2D Echocardiogram has been performed.  Colleen [name] 08/08/2015, 8:40 AM

## 2015-08-08 NOTE — Progress Notes (Signed)
Patient Name: Colleen [name] Date of Encounter: 08/08/2015     Principal Problem:   Acute diastolic CHF (congestive heart failure) Active Problems:   Hypothyroidism   Hyperlipidemia   Essential hypertension   Acute respiratory failure with hypoxia   AKI (acute kidney injury)   Sinus bradycardia   Hyponatremia   Acute kidney injury (nontraumatic)   Acute on chronic respiratory failure    SUBJECTIVE  Feeling "100% better than when she arrived, but not back to baseline." No CP and still with DOE.  CURRENT MEDS . enoxaparin (LOVENOX) injection  40 mg Subcutaneous Q24H  . furosemide  80 mg Intravenous BID  . levothyroxine  100 mcg Oral QAC breakfast  . metolazone  2.5 mg Oral Daily  . potassium chloride  40 mEq Oral BID  . sodium chloride  3 mL Intravenous Q12H  . tiotropium  18 mcg Inhalation Daily    OBJECTIVE  Filed Vitals:   08/07/15 1331 08/07/15 2108 08/08/15 0542 08/08/15 0818  BP: 145/61 135/56 130/52   Pulse: 57 56 54   Temp: 98.4 F (36.9 C) 98.1 F (36.7 C) 97.8 F (36.6 C)   TempSrc: Oral Oral Oral   Resp: _0 Height:      Weight:   171 lb 1.6 oz (77.61 kg)   SpO2: 92% 95% 96% 95%    Intake/Output Summary (Last 24 hours) at 08/08/15 0831 Last data filed at 08/08/15 0541  Gross per 24 hour  Intake   1255 ml  Output   2200 ml  Net   -945 ml   Filed Weights   08/06/15 0520 08/07/15 0705 08/08/15 0542  Weight: 173 lb 11.2 oz (78.79 kg) 172 lb 6.4 oz (78.2 kg) 171 lb 1.6 oz (77.61 kg)    PHYSICAL EXAM   PE: General:Pleasant affect, NAD Skin:Warm and dry, brisk capillary refill HEENT:normocephalic, sclera clear, mucus membranes moist Neck:supple, + JVD Heart:S1S2 RRR without murmur, gallup, rub or click Lungs:clear with few rales, rhonchi, or wheezes KVQ:QVZD, non tender, + BS, do not palpate liver spleen or masses Ext:no lower ext edema, 2+ pedal pulses, 2+ radial pulses Neuro:alert and oriented X 3, MAE, follows commands, + facial  symmetry  Accessory Clinical Findings  CBC  Recent Labs  08/07/15 0315  WBC 7.0  HGB 11.1*  HCT 34.1*  MCV 102.1*  PLT 638   Basic Metabolic Panel  Recent Labs  08/07/15 0315 08/08/15 0356  NA 135 134*  K 3.0* 3.2*  CL 83* 82*  CO2 41* 41*  GLUCOSE 98 120*  BUN 37* 35*  CREATININE 1.15* 1.17*  CALCIUM 9.5 9.3    TELE  Junctional rhythm HR in 50s.  Radiology/Studies  Dg Chest 2 View  08/04/2015   CLINICAL DATA:  Shortness of breath, low oxygen saturation  EXAM: CHEST  2 VIEW  COMPARISON:  07/30/2015  FINDINGS: There is stable cardiomegaly. There are trace bilateral pleural effusions. There is bilateral diffuse interstitial thickening. There is no pneumothorax. There is no acute osseous abnormality.  IMPRESSION: Findings most consistent with mild CHF.   Electronically Signed   By: Kathreen Devoid   On: 08/04/2015 12:36   Dg Chest 2 View  07/22/2015   CLINICAL DATA:  Shortness of breath, dry cough.  EXAM: CHEST  2 VIEW  COMPARISON:  02/20/2015  FINDINGS: There is mild cardiomegaly and vascular congestion. No overt edema. No confluent opacities or effusions. No acute bony abnormality.  IMPRESSION: Cardiomegaly, vascular congestion.  Electronically Signed   By: Rolm Baptise M.D.   On: 07/22/2015 18:48   Ct Angio Chest Pe W/cm &/or Wo Cm  07/28/2015   CLINICAL DATA:  Shortness breath for 2 weeks. Personal history of smoking. The patient quit smoking 15 years ago.  EXAM: CT ANGIOGRAPHY CHEST WITH CONTRAST  TECHNIQUE: Multidetector CT imaging of the chest was performed using the standard protocol during bolus administration of intravenous contrast. Multiplanar CT image reconstructions and MIPs were obtained to evaluate the vascular anatomy.  CONTRAST:  148m OMNIPAQUE IOHEXOL 350 MG/ML SOLN  COMPARISON:  Two-view chest x-ray 07/22/2015.  FINDINGS: Pulmonary arterial opacification is satisfactory. The study is mildly degraded by patient breathing motion. No focal filling defects are  evident to suggest pulmonary emboli.  The heart is mildly enlarged. Coronary artery calcifications are present. There is no significant pericardial effusion.  A precarinal lymph node measures 1.2 cm in short axis. Subcentimeter prevascular lymph nodes are present. No other pathologically enlarged nodes are present. No significant axillary nodes are present.  Small moderate bilateral pleural effusions are present. There is some associated atelectasis.  Diffuse ground-glass attenuation is present throughout both lungs, likely representing edema. No significant airspace consolidation is present.  The bone windows demonstrate exaggerated kyphosis in the lower thoracic spine. No focal lytic or blastic lesions are present.  Review of the MIP images confirms the above findings.  IMPRESSION: 1. No evidence for pulmonary embolus. 2. Cardiomegaly, interstitial edema, and bilateral pleural effusions are compatible with congestive heart failure. 3. No focal airspace opacification other than dependent atelectasis. 4. Enlarged precarinal lymph node. Other smaller mediastinal nodes are present as well. These are likely reactive in the setting of congestive heart failure. These results will be called to the ordering clinician or representative by the Radiologist Assistant, and communication documented in the PACS or zVision Dashboard.   Electronically Signed   By: CSan MorelleM.D.   On: 07/28/2015 11:15   Ct Chest High Resolution  08/07/2015   CLINICAL DATA:  80year old female with history of diastolic congestive heart failure. Dyspnea on exertion. Weight gain. Orthopnea new increasing edema. History of asbestos exposure.  EXAM: CT CHEST WITHOUT CONTRAST  TECHNIQUE: Multidetector CT imaging of the chest was performed following the standard protocol without intravenous contrast. High resolution imaging of the lungs, as well as inspiratory and expiratory imaging, was performed.  COMPARISON:  Chest CT 07/28/2015.   FINDINGS: Mediastinum/Lymph Nodes: Heart size is mildly enlarged. There is no significant pericardial fluid, thickening or pericardial calcification. There is atherosclerosis of the thoracic aorta, the great vessels of the mediastinum and the coronary arteries, including calcified atherosclerotic plaque in the left anterior descending and right coronary arteries. Calcifications of the mitral annulus. Calcifications of the aortic valve. Multiple prominent borderline enlarged and mildly enlarged mediastinal lymph nodes, measuring up to 12 mm in short axis in the low right paratracheal station (unchanged). Esophagus is unremarkable in appearance. No axillary lymphadenopathy.  Lungs/Pleura: Compared to the prior examination from 07/28/2015 there are multiple nodular and mass-like areas of peribronchovascular airspace consolidation now noted in the lower lobes of the lungs bilaterally, presumably infectious or inflammatory in etiology given their rapid development. There are additional patchy areas of ground-glass attenuation scattered throughout the lungs bilaterally, most confluent in the apex of the right upper lobe. Background of mild interlobular septal thickening, suggestive of a background of mild interstitial pulmonary edema. Previously noted small bilateral pleural effusions have nearly completely resolved (trace bilateral pleural effusions are noted on  today's examination). Evaluation of high-resolution images is limited given the acute findings on today's examination. There are some areas of mild subpleural reticulation, however, this could simply be related to underlying edema. No calcified pleural plaques. Inspiratory and expiratory imaging demonstrates extensive air trapping, indicative of small airways disease.  Upper Abdomen: Status post cholecystectomy. Slightly nodular contour of the liver, could indicate early changes of cirrhosis. Atherosclerosis.  Musculoskeletal/Soft Tissues: There are no aggressive  appearing lytic or blastic lesions noted in the visualized portions of the skeleton.  IMPRESSION: 1. Assessment for underlying interstitial lung disease is significantly limited on today's examination secondary to multiple acute findings, as discussed above. No overt changes suggestive of interstitial lung disease are confidently identified. If there continues to be clinical concern for interstitial lung disease, follow-up outpatient evaluation with high-resolution chest CT in 6-12 months would be recommended. 2. Interval development of multifocal peribronchovascular nodular and mass-like appearing airspace consolidation most evident in the lower lobes of the lungs bilaterally (left greater than right), presumably infectious or inflammatory in etiology. 3. Extensive air trapping, indicative of small airways disease. 4. No calcified pleural plaques to suggest asbestos related pleural disease. 5. Mild diffuse interlobular septal thickening, likely to reflect a background of mild interstitial pulmonary edema. Small bilateral pleural effusions have nearly completely resolved compared to the prior examination. 6. Mild cardiomegaly. 7. Atherosclerosis, including 2 vessel coronary artery disease. Assessment for potential risk factor modification, dietary therapy or pharmacologic therapy may be warranted, if clinically indicated. 8. Additional incidental findings, as above.   Electronically Signed   By: Vinnie Langton M.D.   On: 08/07/2015 12:29   Dg Chest Port 1 View  08/07/2015   CLINICAL DATA:  Shortness of Breath  EXAM: PORTABLE CHEST - 1 VIEW  COMPARISON:  08/04/2015  FINDINGS: Cardiac shadow remains enlarged. Prominent vascular congestion is again right greater than left with interstitial changes changes have progressed slightly in the interval from the prior exam although some of this may be technically related. Aortic calcifications are again seen. The bony structures are stable.  IMPRESSION: Changes consistent  with CHF which have worsened slightly in the interval from the prior exam.   Electronically Signed   By: Inez Catalina M.D.   On: 08/07/2015 07:39   Dg Chest Port 1 View  07/30/2015   CLINICAL DATA:  Patient with shortness of breath since last Saturday. Recent diagnosis congestive heart failure.  EXAM: PORTABLE CHEST - 1 VIEW  COMPARISON:  Chest CT 07/28/2015; chest radiograph 07/22/2015  FINDINGS: Monitoring leads overlie the patient. Cardiomegaly. Moderate left and small right pleural effusions. Bilateral perihilar interstitial pulmonary opacities. No pneumothorax.  IMPRESSION: Cardiomegaly with increasing interstitial opacities concerning for pulmonary edema. Moderate left and small right pleural effusions.   Electronically Signed   By: Lovey Newcomer M.D.   On: 07/30/2015 21:18   High Resolution CT 08/07/15:  IMPRESSION: 1. Assessment for underlying interstitial lung disease is significantly limited on today's examination secondary to multiple acute findings, as discussed above. No overt changes suggestive of interstitial lung disease are confidently identified. If there continues to be clinical concern for interstitial lung disease, follow-up outpatient evaluation with high-resolution chest CT in 6-12 months would be recommended. 2. Interval development of multifocal peribronchovascular nodular and mass-like appearing airspace consolidation most evident in the lower lobes of the lungs bilaterally (left greater than right), presumably infectious or inflammatory in etiology. 3. Extensive air trapping, indicative of small airways disease. 4. No calcified pleural plaques to suggest asbestos related  pleural disease. 5. Mild diffuse interlobular septal thickening, likely to reflect a background of mild interstitial pulmonary edema. Small bilateral pleural effusions have nearly completely resolved compared to the prior examination. 6. Mild cardiomegaly. 7. Atherosclerosis, including 2 vessel  coronary artery disease. Assessment for potential risk factor modification, dietary therapy or pharmacologic therapy may be warranted, if clinically indicated. 8. Additional incidental findings, as above.   ASSESSMENT AND PLAN  Ms. Quinones is a 80 yo female with PMHx of diastolic CHF (echo 0/3/54 EF 65-70%), HTN, Hypothyroidism, and sinus bradycardia who presented to the ED on 07/30/15 with complaint of DOE, weight gain, orthopnea and increased edema. W/u including CXR and BNP c/w acute CHF exacerbation and she has been diuresed. She has had continued problems with dyspnea and hypoxemia out of proportion to her cardiac disease and pulmonology has been consulted.   Dyspnea/Hypoxia- patient seen by PCCM yesterday. CT shows infiltrates of unknown etiology (viral or spontaneous clearing of HCAP or BOOP/ILD).  -- High-resolution CT scan of the chest 08/07/15 with interval development of multifocal peribronchovascular nodular and mass-like appearing airspace consolidation most evident in the lower lobes of the lungs bilaterally (L>R), presumably infectious or inflammatory in etiology and extensive air trapping, indicative of small airways disease (Full report copied above).  -- Awaiting spirometry and autoimmune profile and sedimentation rate per PCCM.  -- Started on empiric Spiriva inhaler treatment -- Oxygen therapy for pulse ox greater than 88% -- ECHO with bubble study to r/o pulmonary shunt done this morning- not yet read.   A/C Diastolic CHF: EF normal at 65-70%.   -- Net neg 9.5 L. Weight down 19lbs ( 190--> 171). Prior dry weight 176 lbs.  -- Despite improvement with diuresis her hypoxia has increased.  -- She is currently on IV Lasix 82m BID and metolazone 2.52mqd. CT yesterday with mild pulmonary edema and CXR with slightly worse CHF than prior exam. Physical exam improved. I think she is close to her dry weight. Would continue IV diuretics today as there was some evidence of mild CHF on CT  and CXR yesterday. Consider switching to PO tomorrow if she continues to do well.   HTN: BP is well controlled currently. Resume home Prinzide when creat normalized and she is fully diuresed.  Acute Kidney Injury: resume ACE-I once normalized. Creat 1.17 today.  Hypokalemia: K is 3.2 today. Supplement with K-dur per IM. She is on 20 meq BID   Junctional rhythm- She has had bradycardia for a long time and atenolol stopped 2 weeks ago. This is not felt to be contributing to her SOB.   SiJudy PimpleA-C  Pager [phone] examined chart reviewed  CT suggesting more inflammatory lung issues which makes more sense. Appreciate Pulmonary consult  Suspect ESR will be up and steroids appropriate.  CXR progressive despite Diuresis also supports non cardiac etiology.  Basilar crackles on exam  PeJenkins Rouge

## 2015-08-09 ENCOUNTER — Encounter (HOSPITAL_COMMUNITY)
Admission: EM | Disposition: A | Discharge status: Home / Self Care | Payer: Self-pay | Source: Home / Self Care | Attending: Internal Medicine

## 2015-08-09 ENCOUNTER — Inpatient Hospital Stay (HOSPITAL_COMMUNITY): Payer: Medicare Other

## 2015-08-09 DIAGNOSIS — J189 Pneumonia, unspecified organism: Secondary | ICD-10-CM

## 2015-08-09 HISTORY — PX: VIDEO BRONCHOSCOPY: SHX5072

## 2015-08-09 LAB — BASIC METABOLIC PANEL
Anion gap: 12 (ref 5–15)
BUN: 41 mg/dL — AB (ref 6–20)
CALCIUM: 9.2 mg/dL (ref 8.9–10.3)
CO2: 37 mmol/L — ABNORMAL HIGH (ref 22–32)
CREATININE: 1.07 mg/dL — AB (ref 0.44–1.00)
Chloride: 84 mmol/L — ABNORMAL LOW (ref 101–111)
GFR calc Af Amer: 57 mL/min — ABNORMAL LOW (ref >60)
GFR, EST NON AFRICAN AMERICAN: 49 mL/min — AB (ref >60)
Glucose, Bld: 119 mg/dL — ABNORMAL HIGH (ref 65–99)
Potassium: 2.9 mmol/L — ABNORMAL LOW (ref 3.5–5.1)
SODIUM: 133 mmol/L — AB (ref 135–145)

## 2015-08-09 LAB — PROCALCITONIN

## 2015-08-09 LAB — ANTINUCLEAR ANTIBODIES, IFA: ANTINUCLEAR ANTIBODIES, IFA: NEGATIVE

## 2015-08-09 LAB — MAGNESIUM: MAGNESIUM: 2.2 mg/dL (ref 1.7–2.4)

## 2015-08-09 SURGERY — VIDEO BRONCHOSCOPY WITHOUT FLUORO
Anesthesia: Moderate Sedation | Laterality: Bilateral

## 2015-08-09 MED ORDER — LIDOCAINE HCL 1 % IJ SOLN
INTRAMUSCULAR | Status: DC | PRN
  Administered 2015-08-09: 10 mL

## 2015-08-09 MED ORDER — POLYETHYLENE GLYCOL 3350 17 G PO PACK
17.0000 g | PACK | Freq: Two times a day (BID) | ORAL | Status: DC
  Administered 2015-08-14 – 2015-08-16 (×2): 17 g via ORAL
  Filled 2015-08-09 (×16): qty 1

## 2015-08-09 MED ORDER — FENTANYL CITRATE (PF) 100 MCG/2ML IJ SOLN
INTRAMUSCULAR | Status: DC | PRN
  Administered 2015-08-09: 25 ug via INTRAVENOUS

## 2015-08-09 MED ORDER — POTASSIUM CHLORIDE 10 MEQ/100ML IV SOLN
10.0000 meq | INTRAVENOUS | Status: DC
  Administered 2015-08-09: 10 meq via INTRAVENOUS
  Filled 2015-08-09: qty 100

## 2015-08-09 MED ORDER — FENTANYL CITRATE (PF) 100 MCG/2ML IJ SOLN
INTRAMUSCULAR | Status: AC
  Filled 2015-08-09: qty 4

## 2015-08-09 MED ORDER — LISINOPRIL 10 MG PO TABS
10.0000 mg | ORAL_TABLET | Freq: Every day | ORAL | Status: DC
  Administered 2015-08-09: 10 mg via ORAL
  Filled 2015-08-09 (×2): qty 1

## 2015-08-09 MED ORDER — MIDAZOLAM HCL 5 MG/ML IJ SOLN
INTRAMUSCULAR | Status: AC
  Filled 2015-08-09: qty 2

## 2015-08-09 MED ORDER — FUROSEMIDE 40 MG PO TABS
40.0000 mg | ORAL_TABLET | Freq: Two times a day (BID) | ORAL | Status: DC
  Administered 2015-08-09: 40 mg via ORAL
  Filled 2015-08-09 (×3): qty 1

## 2015-08-09 MED ORDER — MIDAZOLAM HCL 10 MG/2ML IJ SOLN
INTRAMUSCULAR | Status: DC | PRN
  Administered 2015-08-09: 1 mg via INTRAVENOUS

## 2015-08-09 NOTE — Progress Notes (Signed)
Text page sent to MD on call at 0540 regarding critical lab k+2.9. Received new orders for potassium runs. NPO after midnight per order for Bronchoscopy wash this morning. Uneventful night and safety maintained.

## 2015-08-09 NOTE — Progress Notes (Signed)
PATIENT DETAILS Name: Colleen [name] Age: 80 y.o. Sex: female Date of Birth: [birth year] Admit Date: 07/30/2015 Admitting Physician Theressa Millard, MD [id number] [name], DO  Subjective: Continued shortness of breath. Denies dizziness or weakness.  Assessment/Plan: Principal Problem: Acute hypoxic respiratory failure: Initially felt to be acute diastolic heart failure however despite of being below dry weight and significant diuresis, continues to have worsening x-ray changes along with hypoxia on ambulation. CTA chest negative and lower extremity Dopplers negative for venous thromboembolism. Due to her elevated ESR and rheumatoid factor, PCCM suspects an underlying inflammatory process. Despite negative Procalcitonin from her serum, an infectious etiology cannot be completely ruled out. Serum ANA & anti-CCP are currently pending. She was scheduled to undergo bronchoscopy with lavage today to rule out an infectious cause before initiating immunosuppression with prednisone but procedure was postponed due to patient being unable to perform 2/2 oxygen requirement. Procalcitonin remains undetectable indicating infectious etiology unlikely. PCCM following.  Acute diastolic CHF: EF normal at 65-70%. Improved with IV Lasix and metolazone, weight decreased 20 pounds, negative balance of 11.3 L. Has no other signs of decompensation, still hypoxic in spite of significant diuresis. Change IV to PO lasix. Cardiology following, myocardial perfusion imaging ordered.  Sinus bradycardia: Beta blocker was stopped 2 weeks ago. History of Mobitz type II AV block as well as long history of bradycardia which is likely not contributing to her SOB per cardiology.  ARF: Suspect secondary to CHF/diuretics. Creatinine is much improved at 1.07, resume Prinzide when normal.   Hypokalemia: K has dropped to 2.9. Magnesium is WNL. Discontinued metolazone and IV lasix. Replenished by IV K and  IM.  Hyponatremia: Likely secondary to hypervolemia, improved with IV Lasix.  Hypertension: BP is stable at this time, was well controlled on IV lasix. Creatinine nearing normal. Monitor BP's while transitioned from IV lasix to PO. Continue to hold Prinzide until creatinine normalized.   Hypothyroidism: Continue with Synthroid  Hyperlipidemia: Resume niacin on discharge  Disposition: Remain inpatient - discharge likely in 1-2 days with supplemental O2  Antimicrobial agents  See below  Anti-infectives    None      DVT Prophylaxis: Prophylactic Lovenox   Code Status: Full code   Family Communication: no family at bedside  Procedures: None  CONSULTS:  Cardiology  PCCM  Time spent 25 minutes-Greater than 50% of this time was spent in counseling, explanation of diagnosis, planning of further management, and coordination of care.  MEDICATIONS: Scheduled Meds: . enoxaparin (LOVENOX) injection  40 mg Subcutaneous Q24H  . furosemide  40 mg Oral BID  . levothyroxine  100 mcg Oral QAC breakfast  . potassium chloride  40 mEq Oral BID  . sodium chloride  3 mL Intravenous Q12H  . tiotropium  18 mcg Inhalation Daily   Continuous Infusions:  PRN Meds:.sodium chloride, acetaminophen, calcium carbonate, fentaNYL, lidocaine, midazolam, ondansetron (ZOFRAN) IV, sodium chloride    PHYSICAL EXAM: Vital signs in last 24 hours: Filed Vitals:   08/09/15 0800 08/09/15 0805 08/09/15 0810 08/09/15 0930  BP: 139/50 135/42  129/49  Pulse: 48 48 50 51  Temp:      TempSrc:      Resp: _0 Height:      Weight:      SpO2: 87% 91% 97%     Weight change: -1 kg (-2 lb 3.3 oz) Filed Weights   08/07/15 0705 08/08/15 0542 08/09/15  0640  Weight: 78.2 kg (172 lb 6.4 oz) 77.61 kg (171 lb 1.6 oz) 77.2 kg (170 lb 3.1 oz)   Body mass index is 28.32 kg/(m^2).   Gen Exam: Pleasant, sitting up on the side of the bed, in no acute distress. Neck: Supple, No JVD.   Chest: Few rales  noted but mostly clear CVS: S1 and S2 heard. Regular, no murmurs.  Abdomen: soft, BS +, non tender, non distended.  Extremities: no edema, lower extremities warm to touch. Neurologic: Non Focal.  Skin: No Rash.   Wounds: N/A.    Intake/Output from previous day:  Intake/Output Summary (Last 24 hours) at 08/09/15 1210 Last data filed at 08/09/15 4492  Gross per 24 hour  Intake 825.17 ml  Output   2200 ml  Net -1374.83 ml     LAB RESULTS: CBC  Recent Labs Lab 08/07/15 0315  WBC 7.0  HGB 11.1*  HCT 34.1*  PLT 258  MCV 102.1*  MCH 33.2  MCHC 32.6  RDW 13.0    Chemistries   Recent Labs Lab 08/03/15 0552  08/05/15 0359 08/06/15 0342 08/07/15 0315 08/08/15 0356 08/09/15 0351  NA 135  < > 134* 135 135 134* 133*  K 4.2  < > 3.2* 3.3* 3.0* 3.2* 2.9*  CL 88*  < > 84* 83* 83* 82* 84*  CO2 36*  < > 37* 40* 41* 41* 37*  GLUCOSE 112*  < > 113* 116* 98 120* 119*  BUN 28*  < > 33* 38* 37* 35* 41*  CREATININE 1.23*  < > 1.16* 1.20* 1.15* 1.17* 1.07*  CALCIUM 9.4  < > 9.5 9.5 9.5 9.3 9.2  MG 1.9  --   --   --   --   --  2.2  < > = values in this interval not displayed.  GFR Estimated Creatinine Clearance: 45.3 mL/min (by C-G formula based on Cr of 1.07).  MICROBIOLOGY: Recent Results (from the past 240 hour(s))  Urine culture     Status: None   Collection Time: 08/01/15  4:31 PM  Result Value Ref Range Status   Specimen Description URINE, CLEAN CATCH  Final   Special Requests NONE  Final   Culture MULTIPLE SPECIES PRESENT, SUGGEST RECOLLECTION  Final   Report Status 08/03/2015 FINAL  Final    RADIOLOGY STUDIES/RESULTS: Dg Chest 2 View  08/04/2015   CLINICAL DATA:  Shortness of breath, low oxygen saturation  EXAM: CHEST  2 VIEW  COMPARISON:  07/30/2015  FINDINGS: There is stable cardiomegaly. There are trace bilateral pleural effusions. There is bilateral diffuse interstitial thickening. There is no pneumothorax. There is no acute osseous abnormality.  IMPRESSION:  Findings most consistent with mild CHF.   Electronically Signed   By: Kathreen Devoid   On: 08/04/2015 12:36   Dg Chest 2 View  07/22/2015   CLINICAL DATA:  Shortness of breath, dry cough.  EXAM: CHEST  2 VIEW  COMPARISON:  02/20/2015  FINDINGS: There is mild cardiomegaly and vascular congestion. No overt edema. No confluent opacities or effusions. No acute bony abnormality.  IMPRESSION: Cardiomegaly, vascular congestion.   Electronically Signed   By: Rolm Baptise M.D.   On: 07/22/2015 18:48   Ct Angio Chest Pe W/cm &/or Wo Cm  07/28/2015   CLINICAL DATA:  Shortness breath for 2 weeks. Personal history of smoking. The patient quit smoking 15 years ago.  EXAM: CT ANGIOGRAPHY CHEST WITH CONTRAST  TECHNIQUE: Multidetector CT imaging of the chest was performed using  the standard protocol during bolus administration of intravenous contrast. Multiplanar CT image reconstructions and MIPs were obtained to evaluate the vascular anatomy.  CONTRAST:  154m OMNIPAQUE IOHEXOL 350 MG/ML SOLN  COMPARISON:  Two-view chest x-ray 07/22/2015.  FINDINGS: Pulmonary arterial opacification is satisfactory. The study is mildly degraded by patient breathing motion. No focal filling defects are evident to suggest pulmonary emboli.  The heart is mildly enlarged. Coronary artery calcifications are present. There is no significant pericardial effusion.  A precarinal lymph node measures 1.2 cm in short axis. Subcentimeter prevascular lymph nodes are present. No other pathologically enlarged nodes are present. No significant axillary nodes are present.  Small moderate bilateral pleural effusions are present. There is some associated atelectasis.  Diffuse ground-glass attenuation is present throughout both lungs, likely representing edema. No significant airspace consolidation is present.  The bone windows demonstrate exaggerated kyphosis in the lower thoracic spine. No focal lytic or blastic lesions are present.  Review of the MIP images  confirms the above findings.  IMPRESSION: 1. No evidence for pulmonary embolus. 2. Cardiomegaly, interstitial edema, and bilateral pleural effusions are compatible with congestive heart failure. 3. No focal airspace opacification other than dependent atelectasis. 4. Enlarged precarinal lymph node. Other smaller mediastinal nodes are present as well. These are likely reactive in the setting of congestive heart failure. These results will be called to the ordering clinician or representative by the Radiologist Assistant, and communication documented in the PACS or zVision Dashboard.   Electronically Signed   By: CSan MorelleM.D.   On: 07/28/2015 11:15   Ct Chest High Resolution  08/07/2015   CLINICAL DATA:  80year old female with history of diastolic congestive heart failure. Dyspnea on exertion. Weight gain. Orthopnea new increasing edema. History of asbestos exposure.  EXAM: CT CHEST WITHOUT CONTRAST  TECHNIQUE: Multidetector CT imaging of the chest was performed following the standard protocol without intravenous contrast. High resolution imaging of the lungs, as well as inspiratory and expiratory imaging, was performed.  COMPARISON:  Chest CT 07/28/2015.  FINDINGS: Mediastinum/Lymph Nodes: Heart size is mildly enlarged. There is no significant pericardial fluid, thickening or pericardial calcification. There is atherosclerosis of the thoracic aorta, the great vessels of the mediastinum and the coronary arteries, including calcified atherosclerotic plaque in the left anterior descending and right coronary arteries. Calcifications of the mitral annulus. Calcifications of the aortic valve. Multiple prominent borderline enlarged and mildly enlarged mediastinal lymph nodes, measuring up to 12 mm in short axis in the low right paratracheal station (unchanged). Esophagus is unremarkable in appearance. No axillary lymphadenopathy.  Lungs/Pleura: Compared to the prior examination from 07/28/2015 there are  multiple nodular and mass-like areas of peribronchovascular airspace consolidation now noted in the lower lobes of the lungs bilaterally, presumably infectious or inflammatory in etiology given their rapid development. There are additional patchy areas of ground-glass attenuation scattered throughout the lungs bilaterally, most confluent in the apex of the right upper lobe. Background of mild interlobular septal thickening, suggestive of a background of mild interstitial pulmonary edema. Previously noted small bilateral pleural effusions have nearly completely resolved (trace bilateral pleural effusions are noted on today's examination). Evaluation of high-resolution images is limited given the acute findings on today's examination. There are some areas of mild subpleural reticulation, however, this could simply be related to underlying edema. No calcified pleural plaques. Inspiratory and expiratory imaging demonstrates extensive air trapping, indicative of small airways disease.  Upper Abdomen: Status post cholecystectomy. Slightly nodular contour of the liver, could indicate early changes  of cirrhosis. Atherosclerosis.  Musculoskeletal/Soft Tissues: There are no aggressive appearing lytic or blastic lesions noted in the visualized portions of the skeleton.  IMPRESSION: 1. Assessment for underlying interstitial lung disease is significantly limited on today's examination secondary to multiple acute findings, as discussed above. No overt changes suggestive of interstitial lung disease are confidently identified. If there continues to be clinical concern for interstitial lung disease, follow-up outpatient evaluation with high-resolution chest CT in 6-12 months would be recommended. 2. Interval development of multifocal peribronchovascular nodular and mass-like appearing airspace consolidation most evident in the lower lobes of the lungs bilaterally (left greater than right), presumably infectious or inflammatory in  etiology. 3. Extensive air trapping, indicative of small airways disease. 4. No calcified pleural plaques to suggest asbestos related pleural disease. 5. Mild diffuse interlobular septal thickening, likely to reflect a background of mild interstitial pulmonary edema. Small bilateral pleural effusions have nearly completely resolved compared to the prior examination. 6. Mild cardiomegaly. 7. Atherosclerosis, including 2 vessel coronary artery disease. Assessment for potential risk factor modification, dietary therapy or pharmacologic therapy may be warranted, if clinically indicated. 8. Additional incidental findings, as above.   Electronically Signed   By: Vinnie Langton M.D.   On: 08/07/2015 12:29   Dg Chest Port 1 View  08/07/2015   CLINICAL DATA:  Shortness of Breath  EXAM: PORTABLE CHEST - 1 VIEW  COMPARISON:  08/04/2015  FINDINGS: Cardiac shadow remains enlarged. Prominent vascular congestion is again right greater than left with interstitial changes changes have progressed slightly in the interval from the prior exam although some of this may be technically related. Aortic calcifications are again seen. The bony structures are stable.  IMPRESSION: Changes consistent with CHF which have worsened slightly in the interval from the prior exam.   Electronically Signed   By: Inez Catalina M.D.   On: 08/07/2015 07:39   Dg Chest Port 1 View  07/30/2015   CLINICAL DATA:  Patient with shortness of breath since last Saturday. Recent diagnosis congestive heart failure.  EXAM: PORTABLE CHEST - 1 VIEW  COMPARISON:  Chest CT 07/28/2015; chest radiograph 07/22/2015  FINDINGS: Monitoring leads overlie the patient. Cardiomegaly. Moderate left and small right pleural effusions. Bilateral perihilar interstitial pulmonary opacities. No pneumothorax.  IMPRESSION: Cardiomegaly with increasing interstitial opacities concerning for pulmonary edema. Moderate left and small right pleural effusions.   Electronically Signed   By:  Lovey Newcomer M.D.   On: 07/30/2015 21:18    Julia Alkhatib L. Guerry Bruin, PA-S  Oren Binet, MD [facility] Pager:[phone]  If 7PM-7AM, please contact night-coverage [email or web address] Password [email or web address] 08/09/2015, 12:10 PM   LOS: 10 days

## 2015-08-09 NOTE — Progress Notes (Signed)
Patient in for video bronchoscopy.  Upon arrival, initial EKG rhythm was a junctional rhythm at 50.  MD aware; orders received to proceed.  Patient prepped per policy.  Patient started to desaturate.  Oxygen increased to 4l, then 6l [place] with increase of SpO2 to 95%.  MD stopped procedure.  Report called to RN.

## 2015-08-09 NOTE — Progress Notes (Signed)
Patient Name: Colleen [name] Date of Encounter: 08/09/2015     Principal Problem:   Acute diastolic CHF (congestive heart failure) Active Problems:   Hypothyroidism   Hyperlipidemia   Essential hypertension   Acute respiratory failure with hypoxia   AKI (acute kidney injury)   Sinus bradycardia   Hyponatremia   Acute kidney injury (nontraumatic)   Acute on chronic respiratory failure    SUBJECTIVE   No CP and still with mild DOE, much improved from admission.   CURRENT MEDS . enoxaparin (LOVENOX) injection  40 mg Subcutaneous Q24H  . furosemide  80 mg Intravenous BID  . levothyroxine  100 mcg Oral QAC breakfast  . metolazone  2.5 mg Oral Daily  . potassium chloride  10 mEq Intravenous Q1 Hr x 2  . potassium chloride  40 mEq Oral BID  . sodium chloride  3 mL Intravenous Q12H  . tiotropium  18 mcg Inhalation Daily    OBJECTIVE  Filed Vitals:   08/09/15 0735 08/09/15 0740 08/09/15 0745 08/09/15 0750  BP: 152/50 148/47 123/43 108/38  Pulse:  54 50 50  Temp:      TempSrc:      Resp: $Remo'15 18 12 14  'uMBiP$ Height:      Weight:      SpO2:  91% 92% 95%    Intake/Output Summary (Last 24 hours) at 08/09/15 0752 Last data filed at 08/09/15 8563  Gross per 24 hour  Intake 1070.17 ml  Output   2300 ml  Net -1229.83 ml   Filed Weights   08/07/15 0705 08/08/15 0542 08/09/15 0640  Weight: 172 lb 6.4 oz (78.2 kg) 171 lb 1.6 oz (77.61 kg) 170 lb 3.1 oz (77.2 kg)    PHYSICAL EXAM   PE: General:Pleasant affect, NAD Skin:Warm and dry, brisk capillary refill HEENT:normocephalic, sclera clear, mucus membranes moist Neck:supple, no JVD Heart:S1S2 RRR without murmur, gallup, rub or click Lungs:clear with few rales, rhonchi, or wheezes JSH:FWYO, non tender, + BS, do not palpate liver spleen or masses Ext:no lower ext edema, 2+ pedal pulses, 2+ radial pulses Neuro:alert and oriented X 3, MAE, follows commands, + facial symmetry  Accessory Clinical Findings  CBC  Recent  Labs  08/07/15 0315  WBC 7.0  HGB 11.1*  HCT 34.1*  MCV 102.1*  PLT 378   Basic Metabolic Panel  Recent Labs  08/08/15 0356 08/09/15 0351  NA 134* 133*  K 3.2* 2.9*  CL 82* 84*  CO2 41* 37*  GLUCOSE 120* 119*  BUN 35* 41*  CREATININE 1.17* 1.07*  CALCIUM 9.3 9.2    TELE  Sinus brady with HR in high 40s and 50s.   Radiology/Studies  Dg Chest 2 View  08/04/2015   CLINICAL DATA:  Shortness of breath, low oxygen saturation  EXAM: CHEST  2 VIEW  COMPARISON:  07/30/2015  FINDINGS: There is stable cardiomegaly. There are trace bilateral pleural effusions. There is bilateral diffuse interstitial thickening. There is no pneumothorax. There is no acute osseous abnormality.  IMPRESSION: Findings most consistent with mild CHF.   Electronically Signed   By: Kathreen Devoid   On: 08/04/2015 12:36   Dg Chest 2 View  07/22/2015   CLINICAL DATA:  Shortness of breath, dry cough.  EXAM: CHEST  2 VIEW  COMPARISON:  02/20/2015  FINDINGS: There is mild cardiomegaly and vascular congestion. No overt edema. No confluent opacities or effusions. No acute bony abnormality.  IMPRESSION: Cardiomegaly, vascular congestion.   Electronically Signed   By: Lennette Bihari  Dover M.D.   On: 07/22/2015 18:48   Ct Angio Chest Pe W/cm &/or Wo Cm  07/28/2015   CLINICAL DATA:  Shortness breath for 2 weeks. Personal history of smoking. The patient quit smoking 15 years ago.  EXAM: CT ANGIOGRAPHY CHEST WITH CONTRAST  TECHNIQUE: Multidetector CT imaging of the chest was performed using the standard protocol during bolus administration of intravenous contrast. Multiplanar CT image reconstructions and MIPs were obtained to evaluate the vascular anatomy.  CONTRAST:  176m OMNIPAQUE IOHEXOL 350 MG/ML SOLN  COMPARISON:  Two-view chest x-ray 07/22/2015.  FINDINGS: Pulmonary arterial opacification is satisfactory. The study is mildly degraded by patient breathing motion. No focal filling defects are evident to suggest pulmonary emboli.  The  heart is mildly enlarged. Coronary artery calcifications are present. There is no significant pericardial effusion.  A precarinal lymph node measures 1.2 cm in short axis. Subcentimeter prevascular lymph nodes are present. No other pathologically enlarged nodes are present. No significant axillary nodes are present.  Small moderate bilateral pleural effusions are present. There is some associated atelectasis.  Diffuse ground-glass attenuation is present throughout both lungs, likely representing edema. No significant airspace consolidation is present.  The bone windows demonstrate exaggerated kyphosis in the lower thoracic spine. No focal lytic or blastic lesions are present.  Review of the MIP images confirms the above findings.  IMPRESSION: 1. No evidence for pulmonary embolus. 2. Cardiomegaly, interstitial edema, and bilateral pleural effusions are compatible with congestive heart failure. 3. No focal airspace opacification other than dependent atelectasis. 4. Enlarged precarinal lymph node. Other smaller mediastinal nodes are present as well. These are likely reactive in the setting of congestive heart failure. These results will be called to the ordering clinician or representative by the Radiologist Assistant, and communication documented in the PACS or zVision Dashboard.   Electronically Signed   By: CSan MorelleM.D.   On: 07/28/2015 11:15   Ct Chest High Resolution  08/07/2015   CLINICAL DATA:  80year old female with history of diastolic congestive heart failure. Dyspnea on exertion. Weight gain. Orthopnea new increasing edema. History of asbestos exposure.  EXAM: CT CHEST WITHOUT CONTRAST  TECHNIQUE: Multidetector CT imaging of the chest was performed following the standard protocol without intravenous contrast. High resolution imaging of the lungs, as well as inspiratory and expiratory imaging, was performed.  COMPARISON:  Chest CT 07/28/2015.  FINDINGS: Mediastinum/Lymph Nodes: Heart size is  mildly enlarged. There is no significant pericardial fluid, thickening or pericardial calcification. There is atherosclerosis of the thoracic aorta, the great vessels of the mediastinum and the coronary arteries, including calcified atherosclerotic plaque in the left anterior descending and right coronary arteries. Calcifications of the mitral annulus. Calcifications of the aortic valve. Multiple prominent borderline enlarged and mildly enlarged mediastinal lymph nodes, measuring up to 12 mm in short axis in the low right paratracheal station (unchanged). Esophagus is unremarkable in appearance. No axillary lymphadenopathy.  Lungs/Pleura: Compared to the prior examination from 07/28/2015 there are multiple nodular and mass-like areas of peribronchovascular airspace consolidation now noted in the lower lobes of the lungs bilaterally, presumably infectious or inflammatory in etiology given their rapid development. There are additional patchy areas of ground-glass attenuation scattered throughout the lungs bilaterally, most confluent in the apex of the right upper lobe. Background of mild interlobular septal thickening, suggestive of a background of mild interstitial pulmonary edema. Previously noted small bilateral pleural effusions have nearly completely resolved (trace bilateral pleural effusions are noted on today's examination). Evaluation of high-resolution images is  limited given the acute findings on today's examination. There are some areas of mild subpleural reticulation, however, this could simply be related to underlying edema. No calcified pleural plaques. Inspiratory and expiratory imaging demonstrates extensive air trapping, indicative of small airways disease.  Upper Abdomen: Status post cholecystectomy. Slightly nodular contour of the liver, could indicate early changes of cirrhosis. Atherosclerosis.  Musculoskeletal/Soft Tissues: There are no aggressive appearing lytic or blastic lesions noted in the  visualized portions of the skeleton.  IMPRESSION: 1. Assessment for underlying interstitial lung disease is significantly limited on today's examination secondary to multiple acute findings, as discussed above. No overt changes suggestive of interstitial lung disease are confidently identified. If there continues to be clinical concern for interstitial lung disease, follow-up outpatient evaluation with high-resolution chest CT in 6-12 months would be recommended. 2. Interval development of multifocal peribronchovascular nodular and mass-like appearing airspace consolidation most evident in the lower lobes of the lungs bilaterally (left greater than right), presumably infectious or inflammatory in etiology. 3. Extensive air trapping, indicative of small airways disease. 4. No calcified pleural plaques to suggest asbestos related pleural disease. 5. Mild diffuse interlobular septal thickening, likely to reflect a background of mild interstitial pulmonary edema. Small bilateral pleural effusions have nearly completely resolved compared to the prior examination. 6. Mild cardiomegaly. 7. Atherosclerosis, including 2 vessel coronary artery disease. Assessment for potential risk factor modification, dietary therapy or pharmacologic therapy may be warranted, if clinically indicated. 8. Additional incidental findings, as above.   Electronically Signed   By: Vinnie Langton M.D.   On: 08/07/2015 12:29   Dg Chest Port 1 View  08/07/2015   CLINICAL DATA:  Shortness of Breath  EXAM: PORTABLE CHEST - 1 VIEW  COMPARISON:  08/04/2015  FINDINGS: Cardiac shadow remains enlarged. Prominent vascular congestion is again right greater than left with interstitial changes changes have progressed slightly in the interval from the prior exam although some of this may be technically related. Aortic calcifications are again seen. The bony structures are stable.  IMPRESSION: Changes consistent with CHF which have worsened slightly in the  interval from the prior exam.   Electronically Signed   By: Inez Catalina M.D.   On: 08/07/2015 07:39   Dg Chest Port 1 View  07/30/2015   CLINICAL DATA:  Patient with shortness of breath since last Saturday. Recent diagnosis congestive heart failure.  EXAM: PORTABLE CHEST - 1 VIEW  COMPARISON:  Chest CT 07/28/2015; chest radiograph 07/22/2015  FINDINGS: Monitoring leads overlie the patient. Cardiomegaly. Moderate left and small right pleural effusions. Bilateral perihilar interstitial pulmonary opacities. No pneumothorax.  IMPRESSION: Cardiomegaly with increasing interstitial opacities concerning for pulmonary edema. Moderate left and small right pleural effusions.   Electronically Signed   By: Lovey Newcomer M.D.   On: 07/30/2015 21:18   High Resolution CT 08/07/15:  IMPRESSION: 1. Assessment for underlying interstitial lung disease is significantly limited on today's examination secondary to multiple acute findings, as discussed above. No overt changes suggestive of interstitial lung disease are confidently identified. If there continues to be clinical concern for interstitial lung disease, follow-up outpatient evaluation with high-resolution chest CT in 6-12 months would be recommended. 2. Interval development of multifocal peribronchovascular nodular and mass-like appearing airspace consolidation most evident in the lower lobes of the lungs bilaterally (left greater than right), presumably infectious or inflammatory in etiology. 3. Extensive air trapping, indicative of small airways disease. 4. No calcified pleural plaques to suggest asbestos related pleural disease. 5. Mild diffuse interlobular septal  thickening, likely to reflect a background of mild interstitial pulmonary edema. Small bilateral pleural effusions have nearly completely resolved compared to the prior examination. 6. Mild cardiomegaly. 7. Atherosclerosis, including 2 vessel coronary artery disease. Assessment for potential  risk factor modification, dietary therapy or pharmacologic therapy may be warranted, if clinically indicated. 8. Additional incidental findings, as above.   ASSESSMENT AND PLAN  Ms. Cureton is a 80 yo female with PMHx of diastolic CHF (echo 12/28/58 EF 65-70%), HTN, Hypothyroidism, and sinus bradycardia who presented to the ED on 07/30/15 with complaint of DOE, weight gain, orthopnea and increased edema. W/u including CXR and BNP c/w acute CHF exacerbation and she has been diuresed. She has had continued problems with dyspnea and hypoxemia out of proportion to her cardiac disease and pulmonology has been consulted.   Dyspnea/Hypoxia- patient seen by PCCM. CT shows infiltrates of unknown etiology (viral or spontaneous clearing of HCAP or BOOP/ILD).  -- ECHO with bubble study negative -- PCCM suspects underlying inflammatory process that is autoimmune in etiology given her elevated ESR & rheumatoid factor. An infectious etiology cannot be completely ruled out despite negative Procalcitonin from her serum. Serum ANA & anti-CCP are pending. She will undergo bronchoscopy with lavage with the patient to rule out an infectious etiology prior to initiating immunosuppression with prednisone. This was attempted this AM but unable to perform 2/2 oxygen requirement. Procalcitonin remains undetectable indicating low probability for an infectious etiology. Awaiting results of further autoimmune workup with ANA & anti-CCP. May need to consider initiation of steroids without lavage to confirm lack of infectious etiology  A/C Diastolic CHF: EF normal at 65-70%.   -- Net neg 10.8 L. Weight down 20lbs ( 190--> 170). Prior dry weight 176 lbs.  -- She is currently on IV Lasix $Remove'80mg'TkvzOPi$  BID and metolazone 2.$RemoveBeforeDE'5mg'CuVYpMEiGGeSCTE$  qd. Consider switching her to PO lasix as she appear euvolemic. MD TO SEE.  HTN: BP is well controlled currently. Her home Prinzide has been held since admission   Acute Kidney Injury:  Creat normalizing: 1.07  today.  Hypokalemia: K is 2.9. This is being supplemented by IM w/ IV K+  Sinus bradycardia- She has had bradycardia for a long time and atenolol stopped 2 weeks ago. This is not felt to be contributing to her SOB.    Judy Pimple PA-C  Pager [phone]  Patient examined chart reviewed agree with oral diuretics at lower dose.  Dyspnea would not appear to be primarily cardiac in etiology.  Bubble study reviewed and negative for cardiac or pulmonary shunt.  Jenkins Rouge

## 2015-08-09 NOTE — Procedures (Addendum)
Bronchoscopy Procedure Note  Pre-Procedure Diagnoses: 1.  Bilateral pneumonia  Consent:  Informed consent was obtained from the patient after discussing the risks and benefits of the procedure including bleeding, infection, pneumothorax, medication allergy, vocal chord injury, and potentially death.  Medications Administered:   1. Lidocaine 1% gargle 10cc 2. Versed 1 mg IV 3. Fentanyl 25 g IV  Pre-Procedure Physical Exam: General:  No acute distress. Awake. Alert. ASA Class 3. HEENT:  Moist mucus membranes. No oral ulcers. Mallampati Class III. Cardiovascular:  Regular rhythm. Junctional bradycardia on telemetry. No edema. No appreciable JVD. Pulmonary:  Clear to auscultation with good aeration bilaterally. Normal work of breathing on supplemental oxygen. Abdomen:  Soft. Nontender. Nondistended. Normal bowel sounds. Musculoskeletal:  Normal bulk and tone. Normal neck flexion & extension. Neurological:  Oriented to person, place, and time. Moving all 4 extremities equally.  Description of Procedure: Patient was brought back to the endoscopy procedure room.  A time out was performed to identify the correct patient and procedure.  Lidocaine gargle was performed.  Patient was laid recumbent and conscious sedation was administered by respiratory therapy with Fentanyl & Versed.  Bite block was inserted and towel was placed over the patient's eyes.  The patient was saturating only 87% on 2 L/m via nasal cannula. This ultimately was titrated up to 6 L/m to achieve a saturation greater than 96%. Given the degree of oxygen requirement the procedure could not be performed safely with lavage with anticipated desaturation. The bite block was removed and the patient was returned to the upright position.  Blood Loss:  None.  Complications:  None.  Post Procedure Instructions: Patient to be returned to her room. At approximately 8:30 she may undergo bedside swallow evaluation by her nurse and if she  passes may resume her diet. We will continue to await results from her anti-CCP & ANA. Continuing to titrate oxygen to maintain saturation greater than 92%.

## 2015-08-09 NOTE — Progress Notes (Signed)
PULMONARY / CRITICAL CARE MEDICINE   Name: Colleen [name] MRN: [id number] DOB: [birth year]   PCP Garnet Koyanagi, DO   ADMISSION DATE:  07/30/2015 CONSULTATION DATE:  08/07/15 LOS 15 Justin Mend MD :  Dr Lovena Neighbours  CHIEF COMPLAINT:  Dyspnea, hypoxemia  SUBJECTIVE: Patient continues to report breathing has improved since admission. Denies any significant cough. Denies any subjective fever, chills, or sweats. Currently receiving IV potassium this morning for hypokalemia.  ROS: No nausea or vomiting. No chest pain or pressure.  VITAL SIGNS: Temp:  [98 F (36.7 C)-98.9 F (37.2 C)] 98.4 F (36.9 C) (08/17 0731) Pulse Rate:  [48-58] 50 (08/17 0810) Resp:  [11-18] 14 (08/17 0810) BP: (108-152)/(38-52) 135/42 mmHg (08/17 0805) SpO2:  [87 %-98 %] 97 % (08/17 0810) Weight:  [170 lb 3.1 oz (77.2 kg)] 170 lb 3.1 oz (77.2 kg) (08/17 0640) HEMODYNAMICS:   VENTILATOR SETTINGS:   INTAKE / OUTPUT:  Intake/Output Summary (Last 24 hours) at 08/09/15 0811 Last data filed at 08/09/15 3748  Gross per 24 hour  Intake 1070.17 ml  Output   2300 ml  Net -1229.83 ml    PHYSICAL EXAMINATION: General:  Awake. Alert. No acute distress. Laying in bed in endoscopy suite. Integument:  Warm & dry. No rash on exposed skin. No bruising. HEENT:  Moist mucus membranes. No oral ulcers. No scleral injection or icterus. PERRL. Cardiovascular:  Regular rate. No edema. No appreciable JVD.  Pulmonary:  Good aeration & clear to auscultation bilaterally. Symmetric chest wall expansion. No accessory muscle use. Abdomen: Soft. Normal bowel sounds. Nondistended. Grossly nontender. Neurological:  CN 2-12 grossly in tact. No meningismus. Moving all 4 extremities equally.   LABS:  CBC  Recent Labs Lab 08/07/15 0315  WBC 7.0  HGB 11.1*  HCT 34.1*  PLT 258   Coag's No results for input(s): APTT, INR in the last 168 hours. BMET  Recent Labs Lab 08/07/15 0315 08/08/15 0356 08/09/15 0351  NA 135 134*  133*  K 3.0* 3.2* 2.9*  CL 83* 82* 84*  CO2 41* 41* 37*  BUN 37* 35* 41*  CREATININE 1.15* 1.17* 1.07*  GLUCOSE 98 120* 119*   Electrolytes  Recent Labs Lab 08/03/15 0552  08/07/15 0315 08/08/15 0356 08/09/15 0351  CALCIUM 9.4  < > 9.5 9.3 9.2  MG 1.9  --   --   --   --   < > = values in this interval not displayed. Sepsis Markers  Recent Labs Lab 08/07/15 1222 08/09/15 0351  PROCALCITON <0.10 <0.10   ABG No results for input(s): PHART, PCO2ART, PO2ART in the last 168 hours. Liver Enzymes No results for input(s): AST, ALT, ALKPHOS, BILITOT, ALBUMIN in the last 168 hours. Cardiac Enzymes No results for input(s): TROPONINI, PROBNP in the last 168 hours. Glucose No results for input(s): GLUCAP in the last 168 hours.  Imaging No results found.   ASSESSMENT / PLAN: 1. Bilateral pneumonia: Unable to perform bronchoscopy and lavage today secondary to oxygen requirement. Procalcitonin remains undetectable indicating low probability for an infectious etiology. Awaiting results of further autoimmune workup with ANA & anti-CCP. May need to consider initiation of steroids without lavage to confirm lack of infectious etiology. 2. Possible rheumatoid arthritis: Awaiting serum ANA & anti-CCP. 3. Acute hypoxic respiratory failure: Recommend continuing diuresis with Lasix as renal function allows. Recommend continuing to wean FiO2 for saturation greater than 92%. 3. Hypokalemia: Receiving IV potassium chloride previously ordered. I have contacted the lab to add a serum  magnesium to her previously drawn blood work.    Sonia Baller Ashok Cordia, M.D. [facility] Pager:  [phone] After 3pm or if no response, call [phone] 08/09/2015, 8:11 AM

## 2015-08-10 ENCOUNTER — Telehealth: Payer: Self-pay | Admitting: *Deleted

## 2015-08-10 ENCOUNTER — Encounter (HOSPITAL_COMMUNITY): Payer: Self-pay | Admitting: Emergency Medicine

## 2015-08-10 ENCOUNTER — Telehealth: Payer: Self-pay | Admitting: Pulmonary Disease

## 2015-08-10 ENCOUNTER — Telehealth: Payer: Self-pay | Admitting: Cardiovascular Disease

## 2015-08-10 DIAGNOSIS — R918 Other nonspecific abnormal finding of lung field: Secondary | ICD-10-CM | POA: Insufficient documentation

## 2015-08-10 LAB — BASIC METABOLIC PANEL
Anion gap: 11 (ref 5–15)
BUN: 46 mg/dL — AB (ref 6–20)
CALCIUM: 9.1 mg/dL (ref 8.9–10.3)
CO2: 35 mmol/L — AB (ref 22–32)
Chloride: 88 mmol/L — ABNORMAL LOW (ref 101–111)
Creatinine, Ser: 2.23 mg/dL — ABNORMAL HIGH (ref 0.44–1.00)
GFR calc Af Amer: 23 mL/min — ABNORMAL LOW (ref >60)
GFR, EST NON AFRICAN AMERICAN: 20 mL/min — AB (ref >60)
GLUCOSE: 94 mg/dL (ref 65–99)
Potassium: 3.8 mmol/L (ref 3.5–5.1)
Sodium: 134 mmol/L — ABNORMAL LOW (ref 135–145)

## 2015-08-10 MED ORDER — POTASSIUM CHLORIDE CRYS ER 20 MEQ PO TBCR: 20.0000 meq | EXTENDED_RELEASE_TABLET | Freq: Two times a day (BID) | ORAL | Status: DC

## 2015-08-10 MED ORDER — FUROSEMIDE 40 MG PO TABS: 40.0000 mg | ORAL_TABLET | Freq: Every day | ORAL | Status: DC

## 2015-08-10 MED ORDER — TIOTROPIUM BROMIDE MONOHYDRATE 18 MCG IN CAPS: 18.0000 ug | ORAL_CAPSULE | Freq: Every day | RESPIRATORY_TRACT | Status: DC

## 2015-08-10 MED ORDER — FUROSEMIDE 40 MG PO TABS
40.0000 mg | ORAL_TABLET | Freq: Every day | ORAL | Status: DC
  Filled 2015-08-10: qty 1

## 2015-08-10 MED ORDER — ALBUTEROL SULFATE HFA 108 (90 BASE) MCG/ACT IN AERS: 2.0000 | INHALATION_SPRAY | Freq: Four times a day (QID) | RESPIRATORY_TRACT | Status: DC | PRN

## 2015-08-10 MED ORDER — POTASSIUM CHLORIDE CRYS ER 20 MEQ PO TBCR
40.0000 meq | EXTENDED_RELEASE_TABLET | Freq: Every day | ORAL | Status: DC
  Administered 2015-08-11: 40 meq via ORAL
  Filled 2015-08-10: qty 2

## 2015-08-10 MED ORDER — ENOXAPARIN SODIUM 30 MG/0.3ML ~~LOC~~ SOLN
30.0000 mg | SUBCUTANEOUS | Status: DC
  Administered 2015-08-11 – 2015-08-14 (×4): 30 mg via SUBCUTANEOUS
  Filled 2015-08-10 (×4): qty 0.3

## 2015-08-10 NOTE — Telephone Encounter (Signed)
Yes. Please wiggle her in for an appointment in the next week. Thanks.

## 2015-08-10 NOTE — Progress Notes (Signed)
 Patient Name: Colleen [name] Date of Encounter: 08/10/2015     Principal Problem:   Acute diastolic CHF (congestive heart failure) Active Problems:   Hypothyroidism   Hyperlipidemia   Essential hypertension   Acute respiratory failure with hypoxia   AKI (acute kidney injury)   Sinus bradycardia   Hyponatremia   Acute kidney injury (nontraumatic)   Acute on chronic respiratory failure    SUBJECTIVE  Eager to go home. She is feeling much better. No complaints.   CURRENT MEDS . enoxaparin (LOVENOX) injection  40 mg Subcutaneous Q24H  . furosemide  40 mg Oral Daily  . levothyroxine  100 mcg Oral QAC breakfast  . polyethylene glycol  17 g Oral BID  . potassium chloride  40 mEq Oral BID  . sodium chloride  3 mL Intravenous Q12H  . tiotropium  18 mcg Inhalation Daily    OBJECTIVE  Filed Vitals:   08/09/15 1703 08/09/15 1704 08/09/15 2110 08/10/15 0458  BP: 140/50 140/50 95/41 95/29  Pulse:   56 47  Temp:   98.2 F (36.8 C) 97.8 F (36.6 C)  TempSrc:   Oral Oral  Resp:   18 20  Height:      Weight:    174 lb 9.6 oz (79.198 kg)  SpO2:   100% 96%    Intake/Output Summary (Last 24 hours) at 08/10/15 0753 Last data filed at 08/10/15 0615  Gross per 24 hour  Intake 1359.83 ml  Output   1352 ml  Net   7.83 ml   Filed Weights   08/08/15 0542 08/09/15 0640 08/10/15 0458  Weight: 171 lb 1.6 oz (77.61 kg) 170 lb 3.1 oz (77.2 kg) 174 lb 9.6 oz (79.198 kg)    PHYSICAL EXAM   PE: General:Pleasant affect, NAD Skin:Warm and dry, brisk capillary refill HEENT:normocephalic, sclera clear, mucus membranes moist Neck:supple, no JVD Heart:S1S2 RRR without murmur, gallup, rub or click Lungs:clear with few rales, rhonchi, or wheezes Abd:soft, non tender, + BS, do not palpate liver spleen or masses Ext:no lower ext edema, 2+ pedal pulses, 2+ radial pulses Neuro:alert and oriented X 3, MAE, follows commands, + facial symmetry  Accessory Clinical Findings  CBC No results  for input(s): WBC, NEUTROABS, HGB, HCT, MCV, PLT in the last 72 hours. Basic Metabolic Panel  Recent Labs  08/09/15 0351 08/10/15 0510  NA 133* 134*  K 2.9* 3.8  CL 84* 88*  CO2 37* 35*  GLUCOSE 119* 94  BUN 41* 46*  CREATININE 1.07* 2.23*  CALCIUM 9.2 9.1  MG 2.2  --     TELE  Sinus brady with HR in high 40s and 50s.   Radiology/Studies  Dg Chest 2 View  08/04/2015   CLINICAL DATA:  Shortness of breath, low oxygen saturation  EXAM: CHEST  2 VIEW  COMPARISON:  07/30/2015  FINDINGS: There is stable cardiomegaly. There are trace bilateral pleural effusions. There is bilateral diffuse interstitial thickening. There is no pneumothorax. There is no acute osseous abnormality.  IMPRESSION: Findings most consistent with mild CHF.   Electronically Signed   By: Hetal  Patel   On: 08/04/2015 12:36   Dg Chest 2 View  07/22/2015   CLINICAL DATA:  Shortness of breath, dry cough.  EXAM: CHEST  2 VIEW  COMPARISON:  02/20/2015  FINDINGS: There is mild cardiomegaly and vascular congestion. No overt edema. No confluent opacities or effusions. No acute bony abnormality.  IMPRESSION: Cardiomegaly, vascular congestion.   Electronically Signed   By: Kevin    Dover M.D.   On: 07/22/2015 18:48   Ct Angio Chest Pe W/cm &/or Wo Cm  07/28/2015   CLINICAL DATA:  Shortness breath for 2 weeks. Personal history of smoking. The patient quit smoking 15 years ago.  EXAM: CT ANGIOGRAPHY CHEST WITH CONTRAST  TECHNIQUE: Multidetector CT imaging of the chest was performed using the standard protocol during bolus administration of intravenous contrast. Multiplanar CT image reconstructions and MIPs were obtained to evaluate the vascular anatomy.  CONTRAST:  176m OMNIPAQUE IOHEXOL 350 MG/ML SOLN  COMPARISON:  Two-view chest x-ray 07/22/2015.  FINDINGS: Pulmonary arterial opacification is satisfactory. The study is mildly degraded by patient breathing motion. No focal filling defects are evident to suggest pulmonary emboli.  The  heart is mildly enlarged. Coronary artery calcifications are present. There is no significant pericardial effusion.  A precarinal lymph node measures 1.2 cm in short axis. Subcentimeter prevascular lymph nodes are present. No other pathologically enlarged nodes are present. No significant axillary nodes are present.  Small moderate bilateral pleural effusions are present. There is some associated atelectasis.  Diffuse ground-glass attenuation is present throughout both lungs, likely representing edema. No significant airspace consolidation is present.  The bone windows demonstrate exaggerated kyphosis in the lower thoracic spine. No focal lytic or blastic lesions are present.  Review of the MIP images confirms the above findings.  IMPRESSION: 1. No evidence for pulmonary embolus. 2. Cardiomegaly, interstitial edema, and bilateral pleural effusions are compatible with congestive heart failure. 3. No focal airspace opacification other than dependent atelectasis. 4. Enlarged precarinal lymph node. Other smaller mediastinal nodes are present as well. These are likely reactive in the setting of congestive heart failure. These results will be called to the ordering clinician or representative by the Radiologist Assistant, and communication documented in the PACS or zVision Dashboard.   Electronically Signed   By: CSan MorelleM.D.   On: 07/28/2015 11:15   Ct Chest High Resolution  08/07/2015   CLINICAL DATA:  80year old female with history of diastolic congestive heart failure. Dyspnea on exertion. Weight gain. Orthopnea new increasing edema. History of asbestos exposure.  EXAM: CT CHEST WITHOUT CONTRAST  TECHNIQUE: Multidetector CT imaging of the chest was performed following the standard protocol without intravenous contrast. High resolution imaging of the lungs, as well as inspiratory and expiratory imaging, was performed.  COMPARISON:  Chest CT 07/28/2015.  FINDINGS: Mediastinum/Lymph Nodes: Heart size is  mildly enlarged. There is no significant pericardial fluid, thickening or pericardial calcification. There is atherosclerosis of the thoracic aorta, the great vessels of the mediastinum and the coronary arteries, including calcified atherosclerotic plaque in the left anterior descending and right coronary arteries. Calcifications of the mitral annulus. Calcifications of the aortic valve. Multiple prominent borderline enlarged and mildly enlarged mediastinal lymph nodes, measuring up to 12 mm in short axis in the low right paratracheal station (unchanged). Esophagus is unremarkable in appearance. No axillary lymphadenopathy.  Lungs/Pleura: Compared to the prior examination from 07/28/2015 there are multiple nodular and mass-like areas of peribronchovascular airspace consolidation now noted in the lower lobes of the lungs bilaterally, presumably infectious or inflammatory in etiology given their rapid development. There are additional patchy areas of ground-glass attenuation scattered throughout the lungs bilaterally, most confluent in the apex of the right upper lobe. Background of mild interlobular septal thickening, suggestive of a background of mild interstitial pulmonary edema. Previously noted small bilateral pleural effusions have nearly completely resolved (trace bilateral pleural effusions are noted on today's examination). Evaluation of high-resolution images is  limited given the acute findings on today's examination. There are some areas of mild subpleural reticulation, however, this could simply be related to underlying edema. No calcified pleural plaques. Inspiratory and expiratory imaging demonstrates extensive air trapping, indicative of small airways disease.  Upper Abdomen: Status post cholecystectomy. Slightly nodular contour of the liver, could indicate early changes of cirrhosis. Atherosclerosis.  Musculoskeletal/Soft Tissues: There are no aggressive appearing lytic or blastic lesions noted in the  visualized portions of the skeleton.  IMPRESSION: 1. Assessment for underlying interstitial lung disease is significantly limited on today's examination secondary to multiple acute findings, as discussed above. No overt changes suggestive of interstitial lung disease are confidently identified. If there continues to be clinical concern for interstitial lung disease, follow-up outpatient evaluation with high-resolution chest CT in 6-12 months would be recommended. 2. Interval development of multifocal peribronchovascular nodular and mass-like appearing airspace consolidation most evident in the lower lobes of the lungs bilaterally (left greater than right), presumably infectious or inflammatory in etiology. 3. Extensive air trapping, indicative of small airways disease. 4. No calcified pleural plaques to suggest asbestos related pleural disease. 5. Mild diffuse interlobular septal thickening, likely to reflect a background of mild interstitial pulmonary edema. Small bilateral pleural effusions have nearly completely resolved compared to the prior examination. 6. Mild cardiomegaly. 7. Atherosclerosis, including 2 vessel coronary artery disease. Assessment for potential risk factor modification, dietary therapy or pharmacologic therapy may be warranted, if clinically indicated. 8. Additional incidental findings, as above.   Electronically Signed   By: Vinnie Langton M.D.   On: 08/07/2015 12:29   Dg Chest Port 1 View  08/07/2015   CLINICAL DATA:  Shortness of Breath  EXAM: PORTABLE CHEST - 1 VIEW  COMPARISON:  08/04/2015  FINDINGS: Cardiac shadow remains enlarged. Prominent vascular congestion is again right greater than left with interstitial changes changes have progressed slightly in the interval from the prior exam although some of this may be technically related. Aortic calcifications are again seen. The bony structures are stable.  IMPRESSION: Changes consistent with CHF which have worsened slightly in the  interval from the prior exam.   Electronically Signed   By: Inez Catalina M.D.   On: 08/07/2015 07:39   Dg Chest Port 1 View  07/30/2015   CLINICAL DATA:  Patient with shortness of breath since last Saturday. Recent diagnosis congestive heart failure.  EXAM: PORTABLE CHEST - 1 VIEW  COMPARISON:  Chest CT 07/28/2015; chest radiograph 07/22/2015  FINDINGS: Monitoring leads overlie the patient. Cardiomegaly. Moderate left and small right pleural effusions. Bilateral perihilar interstitial pulmonary opacities. No pneumothorax.  IMPRESSION: Cardiomegaly with increasing interstitial opacities concerning for pulmonary edema. Moderate left and small right pleural effusions.   Electronically Signed   By: Lovey Newcomer M.D.   On: 07/30/2015 21:18   High Resolution CT 08/07/15:  IMPRESSION: 1. Assessment for underlying interstitial lung disease is significantly limited on today's examination secondary to multiple acute findings, as discussed above. No overt changes suggestive of interstitial lung disease are confidently identified. If there continues to be clinical concern for interstitial lung disease, follow-up outpatient evaluation with high-resolution chest CT in 6-12 months would be recommended. 2. Interval development of multifocal peribronchovascular nodular and mass-like appearing airspace consolidation most evident in the lower lobes of the lungs bilaterally (left greater than right), presumably infectious or inflammatory in etiology. 3. Extensive air trapping, indicative of small airways disease. 4. No calcified pleural plaques to suggest asbestos related pleural disease. 5. Mild diffuse interlobular septal  thickening, likely to reflect a background of mild interstitial pulmonary edema. Small bilateral pleural effusions have nearly completely resolved compared to the prior examination. 6. Mild cardiomegaly. 7. Atherosclerosis, including 2 vessel coronary artery disease. Assessment for potential  risk factor modification, dietary therapy or pharmacologic therapy may be warranted, if clinically indicated. 8. Additional incidental findings, as above.   ASSESSMENT AND PLAN  Colleen [name] is a 79 yo female with PMHx of diastolic CHF (echo 07/28/15 EF 65-70%), HTN, Hypothyroidism, and sinus bradycardia who presented to the ED on 07/30/15 with complaint of DOE, weight gain, orthopnea and increased edema. W/u including CXR and BNP c/w acute CHF exacerbation and she has been diuresed. She has had continued problems with dyspnea and hypoxemia out of proportion to her cardiac disease and pulmonology has been consulted.   Dyspnea/Hypoxia- patient seen by PCCM. CT shows infiltrates of unknown etiology (viral or spontaneous clearing of HCAP or BOOP/ILD).  -- ECHO with bubble study negative for cardiac or pulmonary shunt. -- PCCM suspects underlying inflammatory process that is autoimmune in etiology given her elevated ESR & rheumatoid factor. An infectious etiology cannot be completely ruled out despite negative Procalcitonin from her serum. Serum ANA & anti-CCP are pending. She will undergo bronchoscopy with lavage with the patient to rule out an infectious etiology prior to initiating immunosuppression with prednisone. This was attempted this AM but unable to perform 2/2 oxygen requirement. Procalcitonin remains undetectable indicating low probability for an infectious etiology. Awaiting results of further autoimmune workup with ANA & anti-CCP. May need to consider initiation of steroids without lavage to confirm lack of infectious etiology  A/C Diastolic CHF: EF normal at 65-70%.   -- Net neg 10.8 L. Weight down 16lbs ( 190--> 174). Prior dry weight 176 lbs.  -- IV Lasix 80mg BID and metolazone 2.5mg qd discontinued yesterday as she appeared to be euvolemic. She was started on Lasix 40mg po qd. Her weight is now up 4 lbs. (170--> 174) -- She is feeling well in terms of dyspnea and eager to go home. We went  over low salt diet and daily weights. I have made her a TOC appointment next week with laura ingold NP for close follow up and BMET.  HTN: BP is well controlled- a little soft currently. Her home Prinzide has been held since admission. I would continue to hold this at discharge. BP meds can be re-evaluated as an outpatient  Acute Kidney Injury:  Creat had normalized and now bumped to 2.23 overnight. BMET next week.   Hypokalemia: repleted  Sinus bradycardia- She has had bradycardia for a long time and atenolol stopped 2 weeks ago. This is not felt to be contributing to her SOB.    Signed, THOMPSON, KATHRYN R PA-C  Pager [phone]  Dyspnea improved but still significant.  With ESR 92 suspect she would benefit from steroids  Currently does not Appear to be any active cardiac issues.  Will sign off Needs close pulmonary f/u with f/u CT scan in about 8 weeks time  Peter Nishan  

## 2015-08-10 NOTE — Telephone Encounter (Signed)
Dr. Ashok Cordia, do you want to work the patient in sooner than 9/8 (next available).

## 2015-08-10 NOTE — Telephone Encounter (Signed)
New message     Pt has TOC w/Laura Ingold 8.25.16 per Katie(PA)

## 2015-08-10 NOTE — Telephone Encounter (Signed)
Do not call Dr. Sloan Leiter if an earlier appt is available, please call patient.

## 2015-08-10 NOTE — Progress Notes (Signed)
Physical Therapy Treatment Patient Details Name: Colleen [name] MRN: [id number] DOB: [birth year] Today's Date: 08/10/2015    History of Present Illness 80 y.o. female with recent diagnosis of Diastolic CHF who presents to the ED with complaints of worsening SOB, DOE, Orthopnea and Edema of both lower legs over a 1 week period.    PT Comments    Patient in bed, agreeable to participate in PT today. Patient was able to ambulate and navigate stairs as described below.  See Vitals below to see pertinent vitals during session, O2 sats monitored throughout. See updated goals as patient met both goals during today's session. Patient educated on benefits of exercising at moderate level (be able to maintain conversation) multiple times each day for her condition, as well as knowing when to take rest breaks and not to rush activity. Patient will benefit from continued PT to address ambulatory endurance, especially with stairs.  Follow Up Recommendations  No PT follow up     Equipment Recommendations  Other (comment) (Home oxygen)    Recommendations for Other Services       Precautions / Restrictions Precautions Precautions: Fall Restrictions Weight Bearing Restrictions: No    Mobility  Bed Mobility Overal bed mobility: Modified Independent Bed Mobility: Supine to Sit     Supine to sit: Modified independent (Device/Increase time)     General bed mobility comments: Used bed rails to rise from supine  Transfers Overall transfer level: Independent Equipment used: None             General transfer comment: Patient able to transfer with no physical assitance.  Ambulation/Gait Ambulation/Gait assistance: Independent Ambulation Distance (Feet): 150 Feet Assistive device: None Gait Pattern/deviations: Step-through pattern;WFL(Within Functional Limits)   Gait velocity interpretation: Below normal speed for age/gender General Gait Details: Monitored O2 throughout ambulation,  required less VC's for breathing techniques this session. Patient able to navigate hallway while managing her oxygen tank. She remained able to hold conversation while ambulating during most of the session.   Stairs Stairs: Yes Stairs assistance: Modified independent (Device/Increase time) Stair Management: Two rails;One rail Left;Alternating pattern;Step to pattern Number of Stairs: 5 (x2) General stair comments: Patient required assistance for management of oxygen tank, reports that family will help her with this task. First time up the stairs, patient required both rails on the way down for balance and support. After a rest break, patient able to navigate stairs using only the left handrail and a step-to pattern on the way down. Both times left rail, alternating pattern going up the stairs.  Wheelchair Mobility    Modified Rankin (Stroke Patients Only)       Balance Overall balance assessment: No apparent balance deficits (not formally assessed)                                  Cognition Arousal/Alertness: Awake/alert Behavior During Therapy: WFL for tasks assessed/performed Overall Cognitive Status: Within Functional Limits for tasks assessed                      Exercises      General Comments        Pertinent Vitals/Pain Pain Assessment: No/denies pain  O2 sats on RA in sitting = 88% O2 sats on 2L [place] during ambulation = 90-91% O2 sats after stairs = 95-97%    Home Living  Prior Function            PT Goals (current goals can now be found in the care plan section) Acute Rehab PT Goals Patient Stated Goal: To get home and cook meals. PT Goal Formulation: With patient Time For Goal Achievement: 08/24/15 Potential to Achieve Goals: Good Progress towards PT goals: Goals met and updated - see care plan    Frequency  Min 3X/week    PT Plan Current plan remains appropriate    Co-evaluation              End of Session Equipment Utilized During Treatment: Gait belt;Oxygen Activity Tolerance: Patient limited by fatigue Patient left: in chair;with call bell/phone within reach     Time: 0923-0941 PT Time Calculation (min) (ACUTE ONLY): 18 min  Charges:  $Gait Training: 8-22 mins                    G CodesRoanna Epley, SPT [phone] 08/10/2015, 11:02 AM

## 2015-08-10 NOTE — Progress Notes (Signed)
PULMONARY / CRITICAL CARE MEDICINE   Name: Colleen [name] MRN: [id number] DOB: 1935/05/02   PCP Garnet Koyanagi, DO   ADMISSION DATE:  07/30/2015 CONSULTATION DATE:  08/07/15 LOS 12 das   REFERRING MD :  Dr Lovena Neighbours  CHIEF COMPLAINT:  Dyspnea, hypoxemia  SUBJECTIVE: Patient again reaffirms this morning that her dyspnea is improving. Denies any significant cough. Denies any subjective fever, chills, or sweats. Of note the patient does have elevation of her creatinine this morning.  ROS: No nausea or vomiting. No chest pain, tightness, or pressure.  VITAL SIGNS: Temp:  [97.8 F (36.6 C)-98.4 F (36.9 C)] 97.8 F (36.6 C) (08/18 0458) Pulse Rate:  [47-56] 47 (08/18 0458) Resp:  [14-20] 20 (08/18 0458) BP: (95-140)/(29-55) 95/29 mmHg (08/18 0458) SpO2:  [96 %-100 %] 96 % (08/18 0458) Weight:  [174 lb 9.6 oz (79.198 kg)] 174 lb 9.6 oz (79.198 kg) (08/18 0458) HEMODYNAMICS:   VENTILATOR SETTINGS:   INTAKE / OUTPUT:  Intake/Output Summary (Last 24 hours) at 08/10/15 1008 Last data filed at 08/10/15 0615  Gross per 24 hour  Intake 1184.83 ml  Output   1102 ml  Net  82.83 ml    PHYSICAL EXAMINATION: General:  Awake. Alert. No distress.  Integument:  Warm & dry. No rash on exposed skin.  HEENT:  Moist mucus membranes. No oral ulcers. PERRL. Cardiovascular:  Regular rate. No edema.  Pulmonary:  Good aeration & clear to auscultation bilaterally. Symmetric chest wall expansion. No accessory muscle use on nasal cannula oxygen. Abdomen: Soft. Normal bowel sounds. Mildly protuberant. Grossly nontender.  LABS:  CBC  Recent Labs Lab 08/07/15 0315  WBC 7.0  HGB 11.1*  HCT 34.1*  PLT 258   Coag's No results for input(s): APTT, INR in the last 168 hours. BMET  Recent Labs Lab 08/08/15 0356 08/09/15 0351 08/10/15 0510  NA 134* 133* 134*  K 3.2* 2.9* 3.8  CL 82* 84* 88*  CO2 41* 37* 35*  BUN 35* 41* 46*  CREATININE 1.17* 1.07* 2.23*  GLUCOSE 120* 119* 94    Electrolytes  Recent Labs Lab 08/08/15 0356 08/09/15 0351 08/10/15 0510  CALCIUM 9.3 9.2 9.1  MG  --  2.2  --    Sepsis Markers  Recent Labs Lab 08/07/15 1222 08/09/15 0351  PROCALCITON <0.10 <0.10   ABG No results for input(s): PHART, PCO2ART, PO2ART in the last 168 hours. Liver Enzymes No results for input(s): AST, ALT, ALKPHOS, BILITOT, ALBUMIN in the last 168 hours. Cardiac Enzymes No results for input(s): TROPONINI, PROBNP in the last 168 hours. Glucose No results for input(s): GLUCAP in the last 168 hours.  Imaging No results found.   ASSESSMENT / PLAN: 1. Bilateral pneumonia: Unable to perform bronchoscopy and lavage secondary to oxygen requirement. ANA negative and anti-CCP are pending. Plan for close follow-up and possible steroid therapy depending upon this result. 2. Possible rheumatoid arthritis: Awaiting serum anti-CCP. Continues to have no joint symptoms. 3. Acute hypoxic respiratory failure: Diuresis ceased secondary to acute kidney injury/acute renal failure. Continuing to wean FiO2 for Sat >92%. 4. Hypokalemia: Resolved. 5. Followup:  Patient scheduled for followup with me on 8/26 at 11:30am.  Creig Hines E. Ashok Cordia, M.D. [facility] Pager:  [phone] After 3pm or if no response, call [phone] 08/10/2015, 10:08 AM

## 2015-08-10 NOTE — Progress Notes (Signed)
Systolic BP in 83'E -assymptomatic-holding discharge for today-repeat BMET in am. Giving 250 cc 0.9 NS over 2-3 hours. Hold Lasix today.

## 2015-08-10 NOTE — Discharge Summary (Addendum)
PATIENT DETAILS Name: Colleen [name] Age: 80 y.o. Sex: female Date of Birth: 1935/05/01 MRN: [id number]. Admitting Physician: Theressa Millard, MD ACZ:[id number] [name], DO  Admit Date: 07/30/2015 Discharge date: 08/10/2015  Recommendations for Outpatient Follow-up:  1. Please check BMET at next visit-and adjust dosing of Lasix accordingly 2. Lisinopril currently on hold 3.  Anti-CCP pending-PCCM-contemplating steroids at some point-outpatient appointment with PCCM already scheduled.  4. Started on home O2  PRIMARY DISCHARGE DIAGNOSIS:  Principal Problem:   Acute diastolic CHF (congestive heart failure) Active Problems:   Hypothyroidism   Hyperlipidemia   Essential hypertension   Acute respiratory failure with hypoxia   AKI (acute kidney injury)   Sinus bradycardia   Hyponatremia   Acute kidney injury (nontraumatic)   Acute on chronic respiratory failure   Pulmonary infiltrate      PAST MEDICAL HISTORY: Past Medical History  Diagnosis Date  . Hyperlipidemia   . Hypertension   . Hypothyroidism   . TIA (transient ischemic attack) 2001  . Carotid artery disease   . Shortness of breath   . CHF (congestive heart failure)     DISCHARGE MEDICATIONS: Current Discharge Medication List    START taking these medications   Details  albuterol (PROVENTIL HFA;VENTOLIN HFA) 108 (90 BASE) MCG/ACT inhaler Inhale 2 puffs into the lungs every 6 (six) hours as needed for wheezing or shortness of breath. Qty: 1 Inhaler, Refills: 0    furosemide (LASIX) 40 MG tablet Take 1 tablet (40 mg total) by mouth daily. Qty: 30 tablet, Refills: 0    potassium chloride SA (K-DUR,KLOR-CON) 20 MEQ tablet Take 1 tablet (20 mEq total) by mouth 2 (two) times daily. Qty: 60 tablet, Refills: 0    tiotropium (SPIRIVA) 18 MCG inhalation capsule Place 1 capsule (18 mcg total) into inhaler and inhale daily. Qty: 30 capsule, Refills: 0      CONTINUE these medications which have NOT CHANGED   Details    Calcium Carbonate (GNP CALCIUM) 1500 MG TABS Take 1,500 mg by mouth every evening.     Cholecalciferol 1000 UNITS tablet Take 1,000 Units by mouth every evening.     levothyroxine (SYNTHROID, LEVOTHROID) 100 MCG tablet Take 100 mcg by mouth daily before breakfast.     magnesium 30 MG tablet Take 30 mg by mouth every evening.     Misc Natural Products (TURMERIC CURCUMIN) CAPS Take 1 capsule by mouth every evening.    niacin 500 MG tablet Take 500 mg by mouth every evening. otc    vitamin B-12 (CYANOCOBALAMIN) 500 MCG tablet Take 500 mcg by mouth every evening.       STOP taking these medications     lisinopril-hydrochlorothiazide (PRINZIDE,ZESTORETIC) 20-12.5 MG per tablet      Potassium 99 MG TABS         ALLERGIES:  No Known Allergies  BRIEF HPI:  See H&P, Labs, Consult and Test reports for all details in brief, patient is a 80 year old female with diagnosis of chronic systolic heart failure, presented to the ED with worsening shortness of breath.She was subsequently admitted for further evaluation and treatment  CONSULTATIONS:   cardiology and pulmonary/intensive care  PERTINENT RADIOLOGIC STUDIES: Dg Chest 2 View  08/04/2015   CLINICAL DATA:  Shortness of breath, low oxygen saturation  EXAM: CHEST  2 VIEW  COMPARISON:  07/30/2015  FINDINGS: There is stable cardiomegaly. There are trace bilateral pleural effusions. There is bilateral diffuse interstitial thickening. There is no pneumothorax. There is no acute  osseous abnormality.  IMPRESSION: Findings most consistent with mild CHF.   Electronically Signed   By: Kathreen Devoid   On: 08/04/2015 12:36   Dg Chest 2 View  07/22/2015   CLINICAL DATA:  Shortness of breath, dry cough.  EXAM: CHEST  2 VIEW  COMPARISON:  02/20/2015  FINDINGS: There is mild cardiomegaly and vascular congestion. No overt edema. No confluent opacities or effusions. No acute bony abnormality.  IMPRESSION: Cardiomegaly, vascular congestion.   Electronically  Signed   By: Rolm Baptise M.D.   On: 07/22/2015 18:48   Ct Angio Chest Pe W/cm &/or Wo Cm  07/28/2015   CLINICAL DATA:  Shortness breath for 2 weeks. Personal history of smoking. The patient quit smoking 15 years ago.  EXAM: CT ANGIOGRAPHY CHEST WITH CONTRAST  TECHNIQUE: Multidetector CT imaging of the chest was performed using the standard protocol during bolus administration of intravenous contrast. Multiplanar CT image reconstructions and MIPs were obtained to evaluate the vascular anatomy.  CONTRAST:  15mL OMNIPAQUE IOHEXOL 350 MG/ML SOLN  COMPARISON:  Two-view chest x-ray 07/22/2015.  FINDINGS: Pulmonary arterial opacification is satisfactory. The study is mildly degraded by patient breathing motion. No focal filling defects are evident to suggest pulmonary emboli.  The heart is mildly enlarged. Coronary artery calcifications are present. There is no significant pericardial effusion.  A precarinal lymph node measures 1.2 cm in short axis. Subcentimeter prevascular lymph nodes are present. No other pathologically enlarged nodes are present. No significant axillary nodes are present.  Small moderate bilateral pleural effusions are present. There is some associated atelectasis.  Diffuse ground-glass attenuation is present throughout both lungs, likely representing edema. No significant airspace consolidation is present.  The bone windows demonstrate exaggerated kyphosis in the lower thoracic spine. No focal lytic or blastic lesions are present.  Review of the MIP images confirms the above findings.  IMPRESSION: 1. No evidence for pulmonary embolus. 2. Cardiomegaly, interstitial edema, and bilateral pleural effusions are compatible with congestive heart failure. 3. No focal airspace opacification other than dependent atelectasis. 4. Enlarged precarinal lymph node. Other smaller mediastinal nodes are present as well. These are likely reactive in the setting of congestive heart failure. These results will be  called to the ordering clinician or representative by the Radiologist Assistant, and communication documented in the PACS or zVision Dashboard.   Electronically Signed   By: San Morelle M.D.   On: 07/28/2015 11:15   Ct Chest High Resolution  08/07/2015   CLINICAL DATA:  80 year old female with history of diastolic congestive heart failure. Dyspnea on exertion. Weight gain. Orthopnea new increasing edema. History of asbestos exposure.  EXAM: CT CHEST WITHOUT CONTRAST  TECHNIQUE: Multidetector CT imaging of the chest was performed following the standard protocol without intravenous contrast. High resolution imaging of the lungs, as well as inspiratory and expiratory imaging, was performed.  COMPARISON:  Chest CT 07/28/2015.  FINDINGS: Mediastinum/Lymph Nodes: Heart size is mildly enlarged. There is no significant pericardial fluid, thickening or pericardial calcification. There is atherosclerosis of the thoracic aorta, the great vessels of the mediastinum and the coronary arteries, including calcified atherosclerotic plaque in the left anterior descending and right coronary arteries. Calcifications of the mitral annulus. Calcifications of the aortic valve. Multiple prominent borderline enlarged and mildly enlarged mediastinal lymph nodes, measuring up to 12 mm in short axis in the low right paratracheal station (unchanged). Esophagus is unremarkable in appearance. No axillary lymphadenopathy.  Lungs/Pleura: Compared to the prior examination from 07/28/2015 there are multiple nodular and  mass-like areas of peribronchovascular airspace consolidation now noted in the lower lobes of the lungs bilaterally, presumably infectious or inflammatory in etiology given their rapid development. There are additional patchy areas of ground-glass attenuation scattered throughout the lungs bilaterally, most confluent in the apex of the right upper lobe. Background of mild interlobular septal thickening, suggestive of a  background of mild interstitial pulmonary edema. Previously noted small bilateral pleural effusions have nearly completely resolved (trace bilateral pleural effusions are noted on today's examination). Evaluation of high-resolution images is limited given the acute findings on today's examination. There are some areas of mild subpleural reticulation, however, this could simply be related to underlying edema. No calcified pleural plaques. Inspiratory and expiratory imaging demonstrates extensive air trapping, indicative of small airways disease.  Upper Abdomen: Status post cholecystectomy. Slightly nodular contour of the liver, could indicate early changes of cirrhosis. Atherosclerosis.  Musculoskeletal/Soft Tissues: There are no aggressive appearing lytic or blastic lesions noted in the visualized portions of the skeleton.  IMPRESSION: 1. Assessment for underlying interstitial lung disease is significantly limited on today's examination secondary to multiple acute findings, as discussed above. No overt changes suggestive of interstitial lung disease are confidently identified. If there continues to be clinical concern for interstitial lung disease, follow-up outpatient evaluation with high-resolution chest CT in 6-12 months would be recommended. 2. Interval development of multifocal peribronchovascular nodular and mass-like appearing airspace consolidation most evident in the lower lobes of the lungs bilaterally (left greater than right), presumably infectious or inflammatory in etiology. 3. Extensive air trapping, indicative of small airways disease. 4. No calcified pleural plaques to suggest asbestos related pleural disease. 5. Mild diffuse interlobular septal thickening, likely to reflect a background of mild interstitial pulmonary edema. Small bilateral pleural effusions have nearly completely resolved compared to the prior examination. 6. Mild cardiomegaly. 7. Atherosclerosis, including 2 vessel coronary artery  disease. Assessment for potential risk factor modification, dietary therapy or pharmacologic therapy may be warranted, if clinically indicated. 8. Additional incidental findings, as above.   Electronically Signed   By: Vinnie Langton M.D.   On: 08/07/2015 12:29   Dg Chest Port 1 View  08/07/2015   CLINICAL DATA:  Shortness of Breath  EXAM: PORTABLE CHEST - 1 VIEW  COMPARISON:  08/04/2015  FINDINGS: Cardiac shadow remains enlarged. Prominent vascular congestion is again right greater than left with interstitial changes changes have progressed slightly in the interval from the prior exam although some of this may be technically related. Aortic calcifications are again seen. The bony structures are stable.  IMPRESSION: Changes consistent with CHF which have worsened slightly in the interval from the prior exam.   Electronically Signed   By: Inez Catalina M.D.   On: 08/07/2015 07:39   Dg Chest Port 1 View  07/30/2015   CLINICAL DATA:  Patient with shortness of breath since last Saturday. Recent diagnosis congestive heart failure.  EXAM: PORTABLE CHEST - 1 VIEW  COMPARISON:  Chest CT 07/28/2015; chest radiograph 07/22/2015  FINDINGS: Monitoring leads overlie the patient. Cardiomegaly. Moderate left and small right pleural effusions. Bilateral perihilar interstitial pulmonary opacities. No pneumothorax.  IMPRESSION: Cardiomegaly with increasing interstitial opacities concerning for pulmonary edema. Moderate left and small right pleural effusions.   Electronically Signed   By: Lovey Newcomer M.D.   On: 07/30/2015 21:18     PERTINENT LAB RESULTS: CBC: No results for input(s): WBC, HGB, HCT, PLT in the last 72 hours. CMET CMP     Component Value Date/Time   NA  134* 08/10/2015 0510   K 3.8 08/10/2015 0510   CL 88* 08/10/2015 0510   CO2 35* 08/10/2015 0510   GLUCOSE 94 08/10/2015 0510   BUN 46* 08/10/2015 0510   CREATININE 2.23* 08/10/2015 0510   CALCIUM 9.1 08/10/2015 0510   PROT 7.3 07/03/2007 1416    ALBUMIN 3.9 07/03/2007 1416   AST 44* 07/03/2007 1416   ALT 32 07/03/2007 1416   ALKPHOS 44 07/03/2007 1416   BILITOT 0.8 07/03/2007 1416   GFRNONAA 20* 08/10/2015 0510   GFRAA 23* 08/10/2015 0510    GFR Estimated Creatinine Clearance: 22 mL/min (by C-G formula based on Cr of 2.23). No results for input(s): LIPASE, AMYLASE in the last 72 hours. No results for input(s): CKTOTAL, CKMB, CKMBINDEX, TROPONINI in the last 72 hours. Invalid input(s): POCBNP No results for input(s): DDIMER in the last 72 hours. No results for input(s): HGBA1C in the last 72 hours. No results for input(s): CHOL, HDL, LDLCALC, TRIG, CHOLHDL, LDLDIRECT in the last 72 hours. No results for input(s): TSH, T4TOTAL, T3FREE, THYROIDAB in the last 72 hours.  Invalid input(s): FREET3 No results for input(s): VITAMINB12, FOLATE, FERRITIN, TIBC, IRON, RETICCTPCT in the last 72 hours. Coags: No results for input(s): INR in the last 72 hours.  Invalid input(s): PT Microbiology: Recent Results (from the past 240 hour(s))  Urine culture     Status: None   Collection Time: 08/01/15  4:31 PM  Result Value Ref Range Status   Specimen Description URINE, CLEAN CATCH  Final   Special Requests NONE  Final   Culture MULTIPLE SPECIES PRESENT, SUGGEST RECOLLECTION  Final   Report Status 08/03/2015 FINAL  Final     BRIEF HOSPITAL COURSE:  Acute hypoxic respiratory failure: Initially felt to be acute diastolic heart failure however despite of being below dry weight and significant diuresis, continued to have worsening x-ray changes along with hypoxia on ambulation. CTA chest negative and lower extremity Dopplers negative for venous thromboembolism. PCCM was subsequently consulted, patient underwent a high resolution CT chest which showed multifocal peribronchovascular nodular and mass-like appearing airspace consolidation most evident in the lower lobes of the lungs bilaterally (left greater than right),presumably infectious or  inflammatory in etiology. PCCM attempted a bronchoscopy on 8/17, however this was aborted because of significant bradycardia. Her rheumatoid factor is significantly elevated, however ANA is negative. Anti-CCP is currently pending. High suspicion for rheumatoid lung disease. PCCM is contemplating starting her on prednisone at some point in the near future, for now plans are to await further serological tests. Patient is still mostly hypoxic on ambulation, she has qualified for home O2-she is being discharged home in a stable [facility] plans for close outpatient follow-up with pulmonology and cardiology.  Acute diastolic CHF: EF normal at 65-70%. Improved with IV Lasix and metolazone, weight  Stable at 179 ls (190 lb on admission. Initially probably did start off with acute diastolic heart failure, but now her hypoxia is thought to be from above noted causes. Unfortunately, her creatinine today has increased to 2.23-I suspect that this was probably from overdiuresis-patient was on intravenous Lasix and metolazone until yesterday. We have backed off on Lasix-currently just on Lasix 40 mg daily. Cardiology has already evaluated the patient, plans are for outpatient follow-up in one week for repeat blood work-up appointment has been made-please see below  Sinus bradycardia: Beta blocker was stopped 2 weeks ago. History of Mobitz type II AV block as well as long history of bradycardia which is likely not contributing to  her SOB per cardiology.  ARF: Suspect secondary to CHF/diuretics. Diuretics adjusted-now only 40 mg oral Lasix daily. See above for further details.  Hypokalemia: Secondary to diuretics-repleted-on potassium supplementation on discharge  Hyponatremia: Likely secondary to hypervolemia, improved Lasix. Follow closely  Hypertension: BP is stable-will continue to hold lisinopril-resume when able  Hypothyroidism: Continue with Synthroid  Hyperlipidemia: Resume niacin on discharge  TODAY-DAY  OF DISCHARGE:  Subjective:   Colleen [name] today has no headache,no chest abdominal pain,no new weakness tingling or numbness, feels much better wants to go home today.   Objective:   Blood pressure 83/30, pulse 52, temperature 97.8 F (36.6 C), temperature source Oral, resp. rate 20, height 5\' 5"  (1.651 m), weight 79.198 kg (174 lb 9.6 oz), SpO2 96 %.  Intake/Output Summary (Last 24 hours) at 08/10/15 1053 Last data filed at 08/10/15 0615  Gross per 24 hour  Intake 1184.83 ml  Output   1102 ml  Net  82.83 ml   Filed Weights   08/08/15 0542 08/09/15 0640 08/10/15 0458  Weight: 77.61 kg (171 lb 1.6 oz) 77.2 kg (170 lb 3.1 oz) 79.198 kg (174 lb 9.6 oz)    Exam Awake Alert, Oriented *3, No new F.N deficits, Normal affect [place].AT,PERRAL Supple Neck,No JVD, No cervical lymphadenopathy appriciated.  Symmetrical Chest wall movement, Good air movement bilaterally, CTAB RRR,No Gallops,Rubs or new Murmurs, No Parasternal Heave +ve B.Sounds, Abd Soft, Non tender, No organomegaly appriciated, No rebound -guarding or rigidity. No Cyanosis, Clubbing or edema, No new Rash or bruise  DISCHARGE CONDITION: Stable  DISPOSITION: Home  DISCHARGE INSTRUCTIONS:    Activity:  As tolerated   Diet recommendation: Heart Healthy diet  Discharge Instructions    (HEART FAILURE PATIENTS) Call MD:  Anytime you have any of the following symptoms: 1) 3 pound weight gain in 24 hours or 5 pounds in 1 week 2) shortness of breath, with or without a dry hacking cough 3) swelling in the hands, feet or stomach 4) if you have to sleep on extra pillows at night in order to breathe.    Complete by:  As directed      Call MD for:  difficulty breathing, headache or visual disturbances    Complete by:  As directed      Diet - low sodium heart healthy    Complete by:  As directed      Increase activity slowly    Complete by:  As directed            Follow-up Information    Follow up with Garnet Koyanagi, DO On  08/11/2015.   Specialty:  Family Medicine   Why:  @ 1:30 PM Mackie Pai, PA for a hospital follow-up. Confirmed appointment with Oran Rein information:   [address] [place] [address] [phone]       Follow up with [facility], NP On 08/17/2015.   Specialties:  Cardiology, Radiology   Why:  @ 9:45am with some lab work   Contact information:   [address] [place] [address] [phone]       Follow up with Tera Partridge, MD On 08/18/2015.   Specialty:  Pulmonary Disease   Why:  hospital follow up with Pulmonologist-appt at 11:30 am   Contact information:   [address] [place] [address] [phone]       Total Time spent on discharge equals  45 minutes.  SignedOren Binet 08/10/2015 10:53  AM

## 2015-08-10 NOTE — Progress Notes (Signed)
Patient seen and examined, she is doing well-and is VERY anxious to go home. She is requesting discharge-her creatinine has increased today-she was on IV Diuretics till yesterday-we will back of to Lasix 40 mg daily. Per cards-they have already made her a follow up appointment next week for repeat BMET. Spoke with PCCM-Dr Lysle Dingwall to discharge without starting steroids-follow up appt with him schduled from 9/8-he will see if he can get her sooner into the office. She is being discharged home at her own request-with plans for close outpatient follow up.

## 2015-08-10 NOTE — Care Management Important Message (Signed)
Important Message  Patient Details  Name: Colleen [name] MRN: [id number] Date of Birth: 09/20/1935   Medicare Important Message Given:  Yes-fourth notification given    Pricilla Handler 08/10/2015, 1:51 PM

## 2015-08-10 NOTE — Telephone Encounter (Signed)
ATC home # line busy Called cell # [email or web address] x1 Can schedule pt on 8/26 at 11:30 am

## 2015-08-10 NOTE — Progress Notes (Signed)
1030 BP low . Asymptomatic . Referrde to Dr. Amadeo Garnet . Lasix po not given . Follow up bp done and reported to MD .0.9nss as per MD 250 cc given . D/c order hold .

## 2015-08-10 NOTE — Telephone Encounter (Signed)
Unable to reach patient at time of TCM Call. Left message for patient to return call when available.  

## 2015-08-11 ENCOUNTER — Telehealth: Payer: Self-pay | Admitting: Medical

## 2015-08-11 ENCOUNTER — Encounter: Payer: Medicare Other | Admitting: Medical

## 2015-08-11 LAB — BASIC METABOLIC PANEL
Anion gap: 13 (ref 5–15)
BUN: 65 mg/dL — AB (ref 6–20)
CALCIUM: 8.6 mg/dL — AB (ref 8.9–10.3)
CO2: 25 mmol/L (ref 22–32)
CREATININE: 3.85 mg/dL — AB (ref 0.44–1.00)
Chloride: 88 mmol/L — ABNORMAL LOW (ref 101–111)
GFR calc non Af Amer: 10 mL/min — ABNORMAL LOW (ref >60)
GFR, EST AFRICAN AMERICAN: 12 mL/min — AB (ref >60)
Glucose, Bld: 90 mg/dL (ref 65–99)
Potassium: 4.7 mmol/L (ref 3.5–5.1)
SODIUM: 126 mmol/L — AB (ref 135–145)

## 2015-08-11 LAB — URINALYSIS, ROUTINE W REFLEX MICROSCOPIC
GLUCOSE, UA: NEGATIVE mg/dL
Ketones, ur: NEGATIVE mg/dL
Nitrite: NEGATIVE
PROTEIN: NEGATIVE mg/dL
Specific Gravity, Urine: 1.017 (ref 1.005–1.030)
UROBILINOGEN UA: 0.2 mg/dL (ref 0.0–1.0)
pH: 5 (ref 5.0–8.0)

## 2015-08-11 LAB — URINE MICROSCOPIC-ADD ON

## 2015-08-11 LAB — PROCALCITONIN: PROCALCITONIN: 0.24 ng/mL

## 2015-08-11 MED ORDER — FUROSEMIDE 10 MG/ML IJ SOLN
80.0000 mg | Freq: Once | INTRAMUSCULAR | Status: AC
  Administered 2015-08-11: 80 mg via INTRAVENOUS
  Filled 2015-08-11: qty 8

## 2015-08-11 MED ORDER — MIDODRINE HCL 5 MG PO TABS: 5.0000 mg | ORAL_TABLET | Freq: Two times a day (BID) | ORAL | Status: DC

## 2015-08-11 MED ORDER — MIDODRINE HCL 5 MG PO TABS
5.0000 mg | ORAL_TABLET | Freq: Three times a day (TID) | ORAL | Status: DC
  Administered 2015-08-11 – 2015-08-14 (×7): 5 mg via ORAL
  Filled 2015-08-11 (×15): qty 1

## 2015-08-11 NOTE — Telephone Encounter (Signed)
LM to schedule pt to visit with JN Can schedule pt on 8/26 at 11:30 am

## 2015-08-11 NOTE — Progress Notes (Signed)
This encounter was created in error - please disregard.

## 2015-08-11 NOTE — Progress Notes (Signed)
Pt resting in bed . Vs taken and recorder

## 2015-08-11 NOTE — Consult Note (Signed)
HPI: I was asked to see Colleen [name] who is a 80 y.o. 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 She was in the hospital on 06/2015 with volume overload, was put on home lasix. Despite that, her weight increased and she felt overloaded. CXR showed mod cardiomegaly and pulm edema. Was admitted for acute diastolic CHF. Was diuresed since then with IV lasix. Had asympto sinus brady, recently had bblocker stopped due to this by primary cardiologist.   - Pulmonary embolism ruled out 07/28/2015 CT angiogram chest  - DVT ruled out on Doppler ultrasound leg 08/06/2015   however despite of being below dry weight and significant diuresis, continued to have worsening x-ray changes along with hypoxia on ambulation. PCCM was consulted to evaluate for possible interstitial lung disease 8/15. High res CT was done. Did not show any interstitial lung disease clearly, did have new mass like air space consolidation on lower lobes B/l, thought to be from infectious cause or autoimmune. Bronch as attempted but patient could not tolerate due to hypoxia. Discharge was planned on 8/18 b/c was anxious to go home but ended up staying b/c BP was low in 80's. She was restarted on lisinopril 2 days before this happened. Crt also went up around the same time.  Lasix was held, was given 250cc bolus.   Currently patient is off lasix, her weight is going up again. She feels SOB is same but feels heavier and congested. Her UOP has decreased, feels that she cannot empty her bladder fully. Denies any other complaints currently.   Past Medical History  Diagnosis Date  . Hyperlipidemia   . Hypertension   . Hypothyroidism   . TIA (transient ischemic attack) 2001  . Carotid artery disease   . Shortness of breath   . CHF (congestive heart failure)    Past  Surgical History  Procedure Laterality Date  . Cholecystectomy    . Abdominal hysterectomy  1975    TAH,BSO  . Carotid endarterectomy  2001    left  . Doppler echocardiography  02/13/2010    EF>55%  . Doppler echocardiography  09/29/2007  . Nm myoview ltd  02/13/2010  . Carotid duplex  11/24/2012  . Carotid duplex  11/01/2010  . Renal duplex  02/13/2010  . Video bronchoscopy Bilateral 08/09/2015    Procedure: VIDEO BRONCHOSCOPY WITHOUT FLUORO;  Surgeon: Collene Gobble, MD;  Location: [place];  Service: Cardiopulmonary;  Laterality: Bilateral;   Social History:  reports that she has quit smoking. She does not have any smokeless tobacco history on file. She reports that she drinks alcohol. She reports that she does not use illicit drugs. Allergies: No Known Allergies No family history on file.  Medications: I have reviewed the patient's current medications.   ROS:   Review of Systems  Constitutional: Negative for fever, chills and diaphoresis.  HENT: Negative for congestion and sore throat.   Respiratory: Positive for shortness of breath. Negative for cough, hemoptysis, sputum production and wheezing.   Cardiovascular: Positive for orthopnea. Negative for chest pain and palpitations.  Gastrointestinal: Negative for nausea, vomiting and diarrhea.  Genitourinary:       Can't empty bladder fully  Musculoskeletal: Negative.   Skin: Negative.   Neurological: Negative for dizziness, seizures, loss of consciousness and headaches.  Endo/Heme/Allergies: Negative.   Psychiatric/Behavioral:  Negative for depression.    Blood pressure 127/39, pulse 50, temperature 98 F (36.7 C), temperature source Oral, resp. rate 16, height 5' 5" (1.651 m), weight 179 lb 4.8 oz (81.33 kg), SpO2 96 %.  Physical Exam  Constitutional: She is oriented to person, place, and time. She appears well-developed and well-nourished. No distress.  HENT:  Head: Normocephalic and atraumatic.  Eyes: EOM are  normal. Pupils are equal, round, and reactive to light. Right eye exhibits no discharge. Left eye exhibits no discharge.  Neck:  Has JVD.   Cardiovascular: Normal rate and regular rhythm.  Exam reveals no gallop and no friction rub.   No murmur heard. Respiratory: Effort normal and breath sounds normal. No respiratory distress. She has no wheezes. She exhibits no tenderness.  GI: Soft. Bowel sounds are normal. She exhibits no distension and no mass.  Has tenderness over right Mid-Upper quadrant.  Musculoskeletal: Normal range of motion. She exhibits no edema or tenderness.  Neurological: She is alert and oriented to person, place, and time. No cranial nerve deficit.  Skin: She is not diaphoretic.  Psychiatric: She has a normal mood and affect.    Results for orders placed or performed during the hospital encounter of 07/30/15 (from the past 48 hour(s))  Basic metabolic panel     Status: Abnormal   Collection Time: 08/10/15  5:10 AM  Result Value Ref Range   Sodium 134 (L) 135 - 145 mmol/L   Potassium 3.8 3.5 - 5.1 mmol/L    Comment: DELTA CHECK NOTED   Chloride 88 (L) 101 - 111 mmol/L   CO2 35 (H) 22 - 32 mmol/L   Glucose, Bld 94 65 - 99 mg/dL   BUN 46 (H) 6 - 20 mg/dL   Creatinine, Ser 2.23 (H) 0.44 - 1.00 mg/dL    Comment: DELTA CHECK NOTED   Calcium 9.1 8.9 - 10.3 mg/dL   GFR calc non Af Amer 20 (L) >60 mL/min   GFR calc Af Amer 23 (L) >60 mL/min    Comment: (NOTE) The eGFR has been calculated using the CKD EPI equation. This calculation has not been validated in all clinical situations. eGFR's persistently <60 mL/min signify possible Chronic Kidney Disease.    Anion gap 11 5 - 15  Procalcitonin     Status: None   Collection Time: 08/11/15  5:26 AM  Result Value Ref Range   Procalcitonin 0.24 ng/mL    Comment:        Interpretation: PCT (Procalcitonin) <= 0.5 ng/mL: Systemic infection (sepsis) is not likely. Local bacterial infection is possible. (NOTE)         ICU  PCT Algorithm               Non ICU PCT Algorithm    ----------------------------     ------------------------------         PCT < 0.25 ng/mL                 PCT < 0.1 ng/mL     Stopping of antibiotics            Stopping of antibiotics       strongly encouraged.               strongly encouraged.    ----------------------------     ------------------------------       PCT level decrease by               PCT < 0.25 ng/mL       >=  80% from peak PCT       OR PCT 0.25 - 0.5 ng/mL          Stopping of antibiotics                                             encouraged.     Stopping of antibiotics           encouraged.    ----------------------------     ------------------------------       PCT level decrease by              PCT >= 0.25 ng/mL       < 80% from peak PCT        AND PCT >= 0.5 ng/mL            Continuin g antibiotics                                              encouraged.       Continuing antibiotics            encouraged.    ----------------------------     ------------------------------     PCT level increase compared          PCT > 0.5 ng/mL         with peak PCT AND          PCT >= 0.5 ng/mL             Escalation of antibiotics                                          strongly encouraged.      Escalation of antibiotics        strongly encouraged.   Basic metabolic panel     Status: Abnormal   Collection Time: 08/11/15  5:26 AM  Result Value Ref Range   Sodium 126 (L) 135 - 145 mmol/L    Comment: DELTA CHECK NOTED   Potassium 4.7 3.5 - 5.1 mmol/L    Comment: DELTA CHECK NOTED   Chloride 88 (L) 101 - 111 mmol/L   CO2 25 22 - 32 mmol/L   Glucose, Bld 90 65 - 99 mg/dL   BUN 65 (H) 6 - 20 mg/dL   Creatinine, Ser 3.85 (H) 0.44 - 1.00 mg/dL    Comment: DELTA CHECK NOTED   Calcium 8.6 (L) 8.9 - 10.3 mg/dL   GFR calc non Af Amer 10 (L) >60 mL/min   GFR calc Af Amer 12 (L) >60 mL/min    Comment: (NOTE) The eGFR has been calculated using the CKD EPI equation. This  calculation has not been validated in all clinical situations. eGFR's persistently <60 mL/min signify possible Chronic Kidney Disease.    Anion gap 13 5 - 15  Urinalysis, Routine w reflex microscopic (not at [facility])     Status: Abnormal   Collection Time: 08/11/15 12:44 PM  Result Value Ref Range   Color, Urine YELLOW YELLOW   APPearance CLOUDY (A) CLEAR   Specific Gravity, Urine 1.017 1.005 - 1.030   pH 5.0 5.0 - 8.0   Glucose, UA  NEGATIVE NEGATIVE mg/dL   Hgb urine dipstick SMALL (A) NEGATIVE   Bilirubin Urine SMALL (A) NEGATIVE   Ketones, ur NEGATIVE NEGATIVE mg/dL   Protein, ur NEGATIVE NEGATIVE mg/dL   Urobilinogen, UA 0.2 0.0 - 1.0 mg/dL   Nitrite NEGATIVE NEGATIVE   Leukocytes, UA LARGE (A) NEGATIVE  Urine microscopic-add on     Status: Abnormal   Collection Time: 08/11/15 12:44 PM  Result Value Ref Range   Squamous Epithelial / LPF MANY (A) RARE   WBC, UA TOO NUMEROUS TO COUNT <3 WBC/hpf   RBC / HPF 3-6 <3 RBC/hpf   Bacteria, UA FEW (A) RARE   No results found.  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  1. AKI - Likely 2/2 to hemodynamically mediated etiology (BP dropped to 80's) + Ace Inhibitor (last dose 8/17) + CHF. Could also have some obstructive component as having incomplete bladder emptying. Baseline Crt 0.86, was initially elevated likely from Cardionreal etiology, then downtrended with diuresis, however, on 8/18 went up from 1.07 >> 2.23 >> 3.85. This around the same time when her BP dropped to as low as 83/30 on 8/18, also had last dose of Lisinopril on 8/17. UA shows many leuks. HIV neg, ANA neg, ccp pending, RA. UOP is low  2. Hyponatremia - likely hypervolemic hyponatremia as sodium 134>>126 after stopping lasix when her weight 170 >>179. Has JVD and hepatic congestion on exam. 3. Acute hypoxic resp failure - likely 2/2 to CHF, but other ddx include inflammatory lung  diasese - PCCM and cards on board. Does appear to be overloaded based on exam and weight currently.  Plan:  1. Asked RN for bladder scan. May need foley. Will do trial of lasix to help with volume status.  2. Avoid ACE I and other nephrotoxins.  3. Monitor kidney function.  Ahmed, Tasrif 08/11/2015, 2:33 PM    Renal Attending:  Complex picture best explained by hemodynamic insult causing AKI and volume excess resulting in hyponatremia.   Will diurese and await recovery of function. POWELL,ALVIN C

## 2015-08-11 NOTE — Progress Notes (Addendum)
PATIENT DETAILS Name: Colleen [name] Age: 80 y.o. Sex: female Date of Birth: 27-Oct-1935 Admit Date: 07/30/2015 Admitting Physician Theressa Millard, MD FMB:[id number] [name], DO  Brief narrative:  80 year old female with history of diastolic heart failure admitted on 8/7 with worsening shortness of breath. She was initially felt to have acute diastolic heart failure, and was started on IV Lasix. In spite of aggressive diuresis, and with her being below her dry weight-she continued to have shortness of breath, x-ray during this time showed worsening congestive heart failure. She subsequently underwent a high-resolution CT of the chest that showed new minus-like appearing airspace consolidation-highly suggestive of a inflammatory process/autoimmune process. PCCM was consulted, bronchoscopy was attempted-could not be completed because of worsening bradycardia. Serological workup so far is positive for a significantly elevated rheumatoid factor. PCCM is contemplating starting patient on steroids once further serological tests are back. Unfortunately latter half of the hospital course has been complicated by development of acute renal failure, likely hemodynamically mediated due to borderline hypotension. Please see below for further details  Subjective: SOB unchanged-no leg edema-but weight now increasing.   Assessment/Plan: Acute hypoxic respiratory failure: Initially etiology felt to be acute diastolic heart failure, however despite of being below dry weight and significant diuresis, continued to have worsening x-ray changes along with hypoxia on ambulation. CTA chest and lower extremity Dopplers negative for venous thromboembolism.PCCM was subsequently consulted, patient underwent a high resolution CT chest which showed multifocal peribronchovascular nodular and mass-like appearing airspace consolidation most evident in the lower lobes of the lungs bilaterally (left greater than  right),presumably infectious or inflammatory in etiology. PCCM attempted a bronchoscopy on 8/17, however this was aborted because of significant bradycardia. Her rheumatoid factor is significantly elevated, however ANA is negative. Anti-CCP is currently pending. High suspicion for rheumatoid lung disease/autoimmune etiology. PCCM is contemplating starting her on prednisone at some point in the near future, for now plans are to await further serological tests. Patient is still mostly hypoxic on ambulation, she has qualified for home O2.   Acute diastolic CHF: Improved with IV Lasix and metolazone, weight decreased to 171 pounds (190 on admit)-however because of ARF-lasix on hold for 2 days-weight now increasing-upto 179 lbs. Remains in a negative balance of 10.9 L. Has no other signs of decompensation-except for weight gain. Continue to hold Lasix for now  DJT:TSVXBLT hemodynamically mediated-given soft BP/Lasix-no significant volume overload on exam. However given suspicion for inflammatory lung disease/autoimmune -will repeat UA to assess proteinuria and RBC casts. Will try to support BP with midodrine to increase renal perfusion, renal consulted as well.   Sinus bradycardia: Heart rate is stable in the 50s, continue to monitor off beta blockers. History of Mobitz type II AV block as well as junctional bradycardia which is likely not contributing to her SOB per cardiology.  Hypokalemia: follow closey  Hyponatremia: Likely secondary to hypervolemia,iniytially improved with IV Lasix-but now downtrending as Lasix on hold-follow  Hypertension: BP now soft-diuretics on hold-see above regarding midodrine  Hypothyroidism: Continue with Synthroid  Hyperlipidemia: Resume niacin on discharge  Disposition: Remain inpatient  Antimicrobial agents  See below  Anti-infectives    None      DVT Prophylaxis: Prophylactic Lovenox   Code Status: Full code   Family Communication None at  [email or web address]  Procedures: None  CONSULTS:  cardiology, pulmonary/intensive care and nephrology  Time spent 30 minutes-Greater than 50% of this time was spent in  counseling, explanation of diagnosis, planning of further management, and coordination of care.  MEDICATIONS: Scheduled Meds: . enoxaparin (LOVENOX) injection  30 mg Subcutaneous Q24H  . levothyroxine  100 mcg Oral QAC breakfast  . midodrine  5 mg Oral BID WC  . polyethylene glycol  17 g Oral BID  . potassium chloride  40 mEq Oral Daily  . sodium chloride  3 mL Intravenous Q12H  . tiotropium  18 mcg Inhalation Daily   Continuous Infusions:  PRN Meds:.sodium chloride, acetaminophen, calcium carbonate, fentaNYL, lidocaine, midazolam, ondansetron (ZOFRAN) IV, sodium chloride    PHYSICAL EXAM: Vital signs in last 24 hours: Filed Vitals:   08/10/15 1425 08/10/15 1500 08/10/15 2100 08/11/15 0526  BP: 90/30 96/43 98/35  97/42  Pulse: 45  50 45  Temp:  97.9 F (36.6 C) 97.9 F (36.6 C) 98 F (36.7 C)  TempSrc:  Oral Oral Oral  Resp:  18 18 18   Height:      Weight:    81.33 kg (179 lb 4.8 oz)  SpO2:  98% 96% 96%    Weight change: 2.132 kg (4 lb 11.2 oz) Filed Weights   08/09/15 0640 08/10/15 0458 08/11/15 0526  Weight: 77.2 kg (170 lb 3.1 oz) 79.198 kg (174 lb 9.6 oz) 81.33 kg (179 lb 4.8 oz)   Body mass index is 29.84 kg/(m^2).   Gen Exam: Awake and alert with clear speech.  Neck: Supple, No JVD.   Chest: B/L Clear except for a few bibasilar rales CVS: S1 S2 Regular, no murmurs.  Abdomen: soft, BS +, non tender, non distended.  Extremities: no edema, lower extremities warm to touch. Neurologic: Non Focal.   Skin: No Rash.   Wounds: N/A.   Intake/Output from previous day:  Intake/Output Summary (Last 24 hours) at 08/11/15 0842 Last data filed at 08/11/15 0600  Gross per 24 hour  Intake    540 ml  Output    652 ml  Net   -112 ml     LAB RESULTS: CBC  Recent Labs Lab 08/07/15 0315  WBC 7.0  HGB  11.1*  HCT 34.1*  PLT 258  MCV 102.1*  MCH 33.2  MCHC 32.6  RDW 13.0    Chemistries   Recent Labs Lab 08/07/15 0315 08/08/15 0356 08/09/15 0351 08/10/15 0510 08/11/15 0526  NA 135 134* 133* 134* 126*  K 3.0* 3.2* 2.9* 3.8 4.7  CL 83* 82* 84* 88* 88*  CO2 41* 41* 37* 35* 25  GLUCOSE 98 120* 119* 94 90  BUN 37* 35* 41* 46* 65*  CREATININE 1.15* 1.17* 1.07* 2.23* 3.85*  CALCIUM 9.5 9.3 9.2 9.1 8.6*  MG  --   --  2.2  --   --     CBG: No results for input(s): GLUCAP in the last 168 hours.  GFR Estimated Creatinine Clearance: 12.9 mL/min (by C-G formula based on Cr of 3.85).  Coagulation profile No results for input(s): INR, PROTIME in the last 168 hours.  Cardiac Enzymes No results for input(s): CKMB, TROPONINI, MYOGLOBIN in the last 168 hours.  Invalid input(s): CK  Invalid input(s): POCBNP No results for input(s): DDIMER in the last 72 hours. No results for input(s): HGBA1C in the last 72 hours. No results for input(s): CHOL, HDL, LDLCALC, TRIG, CHOLHDL, LDLDIRECT in the last 72 hours. No results for input(s): TSH, T4TOTAL, T3FREE, THYROIDAB in the last 72 hours.  Invalid input(s): FREET3 No results for input(s): VITAMINB12, FOLATE, FERRITIN, TIBC, IRON, RETICCTPCT in the last 72 hours.  No results for input(s): LIPASE, AMYLASE in the last 72 hours.  Urine Studies No results for input(s): UHGB, CRYS in the last 72 hours.  Invalid input(s): UACOL, UAPR, USPG, UPH, UTP, UGL, UKET, UBIL, UNIT, UROB, ULEU, UEPI, UWBC, URBC, UBAC, CAST, UCOM, BILUA  MICROBIOLOGY: Recent Results (from the past 240 hour(s))  Urine culture     Status: None   Collection Time: 08/01/15  4:31 PM  Result Value Ref Range Status   Specimen Description URINE, CLEAN CATCH  Final   Special Requests NONE  Final   Culture MULTIPLE SPECIES PRESENT, SUGGEST RECOLLECTION  Final   Report Status 08/03/2015 FINAL  Final    RADIOLOGY STUDIES/RESULTS: Dg Chest 2 View  08/04/2015   CLINICAL  DATA:  Shortness of breath, low oxygen saturation  EXAM: CHEST  2 VIEW  COMPARISON:  07/30/2015  FINDINGS: There is stable cardiomegaly. There are trace bilateral pleural effusions. There is bilateral diffuse interstitial thickening. There is no pneumothorax. There is no acute osseous abnormality.  IMPRESSION: Findings most consistent with mild CHF.   Electronically Signed   By: Kathreen Devoid   On: 08/04/2015 12:36   Dg Chest 2 View  07/22/2015   CLINICAL DATA:  Shortness of breath, dry cough.  EXAM: CHEST  2 VIEW  COMPARISON:  02/20/2015  FINDINGS: There is mild cardiomegaly and vascular congestion. No overt edema. No confluent opacities or effusions. No acute bony abnormality.  IMPRESSION: Cardiomegaly, vascular congestion.   Electronically Signed   By: Rolm Baptise M.D.   On: 07/22/2015 18:48   Ct Angio Chest Pe W/cm &/or Wo Cm  07/28/2015   CLINICAL DATA:  Shortness breath for 2 weeks. Personal history of smoking. The patient quit smoking 15 years ago.  EXAM: CT ANGIOGRAPHY CHEST WITH CONTRAST  TECHNIQUE: Multidetector CT imaging of the chest was performed using the standard protocol during bolus administration of intravenous contrast. Multiplanar CT image reconstructions and MIPs were obtained to evaluate the vascular anatomy.  CONTRAST:  118mL OMNIPAQUE IOHEXOL 350 MG/ML SOLN  COMPARISON:  Two-view chest x-ray 07/22/2015.  FINDINGS: Pulmonary arterial opacification is satisfactory. The study is mildly degraded by patient breathing motion. No focal filling defects are evident to suggest pulmonary emboli.  The heart is mildly enlarged. Coronary artery calcifications are present. There is no significant pericardial effusion.  A precarinal lymph node measures 1.2 cm in short axis. Subcentimeter prevascular lymph nodes are present. No other pathologically enlarged nodes are present. No significant axillary nodes are present.  Small moderate bilateral pleural effusions are present. There is some associated  atelectasis.  Diffuse ground-glass attenuation is present throughout both lungs, likely representing edema. No significant airspace consolidation is present.  The bone windows demonstrate exaggerated kyphosis in the lower thoracic spine. No focal lytic or blastic lesions are present.  Review of the MIP images confirms the above findings.  IMPRESSION: 1. No evidence for pulmonary embolus. 2. Cardiomegaly, interstitial edema, and bilateral pleural effusions are compatible with congestive heart failure. 3. No focal airspace opacification other than dependent atelectasis. 4. Enlarged precarinal lymph node. Other smaller mediastinal nodes are present as well. These are likely reactive in the setting of congestive heart failure. These results will be called to the ordering clinician or representative by the Radiologist Assistant, and communication documented in the PACS or zVision Dashboard.   Electronically Signed   By: San Morelle M.D.   On: 07/28/2015 11:15   Ct Chest High Resolution  08/07/2015   CLINICAL DATA:  80 year old female  with history of diastolic congestive heart failure. Dyspnea on exertion. Weight gain. Orthopnea new increasing edema. History of asbestos exposure.  EXAM: CT CHEST WITHOUT CONTRAST  TECHNIQUE: Multidetector CT imaging of the chest was performed following the standard protocol without intravenous contrast. High resolution imaging of the lungs, as well as inspiratory and expiratory imaging, was performed.  COMPARISON:  Chest CT 07/28/2015.  FINDINGS: Mediastinum/Lymph Nodes: Heart size is mildly enlarged. There is no significant pericardial fluid, thickening or pericardial calcification. There is atherosclerosis of the thoracic aorta, the great vessels of the mediastinum and the coronary arteries, including calcified atherosclerotic plaque in the left anterior descending and right coronary arteries. Calcifications of the mitral annulus. Calcifications of the aortic valve. Multiple  prominent borderline enlarged and mildly enlarged mediastinal lymph nodes, measuring up to 12 mm in short axis in the low right paratracheal station (unchanged). Esophagus is unremarkable in appearance. No axillary lymphadenopathy.  Lungs/Pleura: Compared to the prior examination from 07/28/2015 there are multiple nodular and mass-like areas of peribronchovascular airspace consolidation now noted in the lower lobes of the lungs bilaterally, presumably infectious or inflammatory in etiology given their rapid development. There are additional patchy areas of ground-glass attenuation scattered throughout the lungs bilaterally, most confluent in the apex of the right upper lobe. Background of mild interlobular septal thickening, suggestive of a background of mild interstitial pulmonary edema. Previously noted small bilateral pleural effusions have nearly completely resolved (trace bilateral pleural effusions are noted on today's examination). Evaluation of high-resolution images is limited given the acute findings on today's examination. There are some areas of mild subpleural reticulation, however, this could simply be related to underlying edema. No calcified pleural plaques. Inspiratory and expiratory imaging demonstrates extensive air trapping, indicative of small airways disease.  Upper Abdomen: Status post cholecystectomy. Slightly nodular contour of the liver, could indicate early changes of cirrhosis. Atherosclerosis.  Musculoskeletal/Soft Tissues: There are no aggressive appearing lytic or blastic lesions noted in the visualized portions of the skeleton.  IMPRESSION: 1. Assessment for underlying interstitial lung disease is significantly limited on today's examination secondary to multiple acute findings, as discussed above. No overt changes suggestive of interstitial lung disease are confidently identified. If there continues to be clinical concern for interstitial lung disease, follow-up outpatient evaluation  with high-resolution chest CT in 6-12 months would be recommended. 2. Interval development of multifocal peribronchovascular nodular and mass-like appearing airspace consolidation most evident in the lower lobes of the lungs bilaterally (left greater than right), presumably infectious or inflammatory in etiology. 3. Extensive air trapping, indicative of small airways disease. 4. No calcified pleural plaques to suggest asbestos related pleural disease. 5. Mild diffuse interlobular septal thickening, likely to reflect a background of mild interstitial pulmonary edema. Small bilateral pleural effusions have nearly completely resolved compared to the prior examination. 6. Mild cardiomegaly. 7. Atherosclerosis, including 2 vessel coronary artery disease. Assessment for potential risk factor modification, dietary therapy or pharmacologic therapy may be warranted, if clinically indicated. 8. Additional incidental findings, as above.   Electronically Signed   By: Vinnie Langton M.D.   On: 08/07/2015 12:29   Dg Chest Port 1 View  08/07/2015   CLINICAL DATA:  Shortness of Breath  EXAM: PORTABLE CHEST - 1 VIEW  COMPARISON:  08/04/2015  FINDINGS: Cardiac shadow remains enlarged. Prominent vascular congestion is again right greater than left with interstitial changes changes have progressed slightly in the interval from the prior exam although some of this may be technically related. Aortic calcifications are again seen.  The bony structures are stable.  IMPRESSION: Changes consistent with CHF which have worsened slightly in the interval from the prior exam.   Electronically Signed   By: Inez Catalina M.D.   On: 08/07/2015 07:39   Dg Chest Port 1 View  07/30/2015   CLINICAL DATA:  Patient with shortness of breath since last Saturday. Recent diagnosis congestive heart failure.  EXAM: PORTABLE CHEST - 1 VIEW  COMPARISON:  Chest CT 07/28/2015; chest radiograph 07/22/2015  FINDINGS: Monitoring leads overlie the patient.  Cardiomegaly. Moderate left and small right pleural effusions. Bilateral perihilar interstitial pulmonary opacities. No pneumothorax.  IMPRESSION: Cardiomegaly with increasing interstitial opacities concerning for pulmonary edema. Moderate left and small right pleural effusions.   Electronically Signed   By: Lovey Newcomer M.D.   On: 07/30/2015 21:18    Oren Binet, MD  [facility] Pager:[phone]  If 7PM-7AM, please contact night-coverage [email or web address] Password [email or web address] 08/11/2015, 8:42 AM   LOS: 12 days

## 2015-08-11 NOTE — Telephone Encounter (Signed)
Unable to reach patient at time of TCM Call.  Did not leave message as patient has appointment today.

## 2015-08-11 NOTE — Progress Notes (Signed)
1700 bladder scan done 60 cc  Residual . Bladder not distended no urge to urinate

## 2015-08-11 NOTE — Progress Notes (Signed)
PULMONARY / CRITICAL CARE MEDICINE   Name: Colleen [name] MRN: [id number] DOB: 02/03/1935   PCP Garnet Koyanagi, DO   ADMISSION DATE:  07/30/2015 CONSULTATION DATE:  08/07/15 LOS 11 das   REFERRING MD :  Dr Lovena Neighbours  CHIEF COMPLAINT:  Dyspnea, hypoxemia  SUBJECTIVE: Denies any dyspnea at rest. Ambulated in hallway with dyspnea yesterday but no near syncope. Denies any chest pain with ambulation but did have bilateral "shoulder pain". Continues to denies any joint pain or swelling.  ROS: No nausea or vomiting. Reports minimal urine output. No abdominal pain. No fever, chills, or sweats.  VITAL SIGNS: Temp:  [97.9 F (36.6 C)-98 F (36.7 C)] 98 F (36.7 C) (08/19 0526) Pulse Rate:  [45-52] 45 (08/19 0526) Resp:  [18] 18 (08/19 0526) BP: (83-98)/(30-43) 97/42 mmHg (08/19 0526) SpO2:  [96 %-98 %] 96 % (08/19 0526) Weight:  [179 lb 4.8 oz (81.33 kg)] 179 lb 4.8 oz (81.33 kg) (08/19 0526) HEMODYNAMICS:   VENTILATOR SETTINGS:   INTAKE / OUTPUT:  Intake/Output Summary (Last 24 hours) at 08/11/15 1027 Last data filed at 08/11/15 0937  Gross per 24 hour  Intake    540 ml  Output    651 ml  Net   -111 ml    PHYSICAL EXAMINATION: General:  Awake. Alert. Watching TV in bed. Integument:  Warm & dry. No rash. HEENT:  Moist mucus membranes. No oral ulcers. No scleral icterus. Cardiovascular:  Regular rate. No edema.  Pulmonary:  Clear bilaterally to auscultation. Normal WOB on [place] oxygen. Abdomen: Soft. Normal bowel sounds. Mildly protuberant. Grossly nontender.  LABS:  CBC  Recent Labs Lab 08/07/15 0315  WBC 7.0  HGB 11.1*  HCT 34.1*  PLT 258   Coag's No results for input(s): APTT, INR in the last 168 hours. BMET  Recent Labs Lab 08/09/15 0351 08/10/15 0510 08/11/15 0526  NA 133* 134* 126*  K 2.9* 3.8 4.7  CL 84* 88* 88*  CO2 37* 35* 25  BUN 41* 46* 65*  CREATININE 1.07* 2.23* 3.85*  GLUCOSE 119* 94 90   Electrolytes  Recent Labs Lab 08/09/15 0351  08/10/15 0510 08/11/15 0526  CALCIUM 9.2 9.1 8.6*  MG 2.2  --   --    Sepsis Markers  Recent Labs Lab 08/07/15 1222 08/09/15 0351 08/11/15 0526  PROCALCITON <0.10 <0.10 0.24   ABG No results for input(s): PHART, PCO2ART, PO2ART in the last 168 hours. Liver Enzymes No results for input(s): AST, ALT, ALKPHOS, BILITOT, ALBUMIN in the last 168 hours. Cardiac Enzymes No results for input(s): TROPONINI, PROBNP in the last 168 hours. Glucose No results for input(s): GLUCAP in the last 168 hours.  Imaging No results found.   ASSESSMENT / PLAN: 1. Bilateral pneumonia: Unable to perform bronchoscopy and lavage secondary to oxygen requirement. ANA negative. Reordering Anti-CCP. Plan for close follow-up and possible steroid therapy depending upon this result. 2. Possible Rheumatoid Arthritis:  Continues to have no joint complaints. Awaiting Anti-CCP. 3. Acute hypoxic respiratory failure: Diuresis ceased secondary to acute kidney injury/acute renal failure. Continuing to wean FiO2 for Sat >92%. 4. Acute Renal Failure:  Likely due to diuresis. Per primary service. U/A pending. 5. Hypokalemia: Resolved. 6. Followup:  Patient scheduled for followup with me on 8/26 at 11:30am.  Patient will not be seen over the weekend. Please call us if there is a change in clinical status or we can be of help.  Sonia Baller Ashok Cordia, M.D. [facility] Pager:  [phone] After  3pm or if no response, call [phone] 08/11/2015, 10:27 AM

## 2015-08-11 NOTE — Progress Notes (Signed)
Physical Therapy Treatment Patient Details Name: Colleen [name] MRN: [id number] DOB: [birth year] Today's Date: Aug 23, 2015    History of Present Illness 80 y.o. female with recent diagnosis of Diastolic CHF who presents to the ED with complaints of worsening SOB, DOE, Orthopnea and Edema of both lower legs over a 1 week period.    PT Comments    Pt was able to walk to BR x PT and noted sats 98% and pulse 51 after sitting.  Did bed ex with pt and noted pulse 48 and sat 98% and followed up with nursing about this.  Her vitals are supporting oxygen and may be mainly HR that attributes to her fatigue with effort, due to low values.  Follow Up Recommendations  No PT follow up     Equipment Recommendations  None recommended by PT    Recommendations for Other Services       Precautions / Restrictions Precautions Precautions: Fall Precaution Comments: O2 sat ck Restrictions Weight Bearing Restrictions: No    Mobility  Bed Mobility Overal bed mobility: Modified Independent                Transfers Overall transfer level: Modified independent                  Ambulation/Gait                 Stairs            Wheelchair Mobility    Modified Rankin (Stroke Patients Only)       Balance Overall balance assessment: No apparent balance deficits (not formally assessed)                                  Cognition Arousal/Alertness: Awake/alert Behavior During Therapy: WFL for tasks assessed/performed Overall Cognitive Status: Within Functional Limits for tasks assessed                      Exercises General Exercises - Lower Extremity Ankle Circles/Pumps: AROM;Both;10 reps Quad Sets: AROM;Both;15 reps Gluteal Sets: AROM;Both;15 reps Heel Slides: AROM;Both;Strengthening;15 reps Hip ABduction/ADduction: AROM;Strengthening;Both;15 reps Hip Flexion/Marching: AROM;Both;10 reps    General Comments        Pertinent  Vitals/Pain Pain Assessment: No/denies pain    Home Living                      Prior Function            PT Goals (current goals can now be found in the care plan section) Acute Rehab PT Goals Patient Stated Goal: to get better and get home Progress towards PT goals: Progressing toward goals    Frequency  Min 3X/week    PT Plan Current plan remains appropriate    Co-evaluation             End of Session   Activity Tolerance: Patient limited by fatigue (O2 sats were stable with gait to BR at 98% and with exercise) Patient left: in bed;with call bell/phone within reach     Time: 1452-1509 PT Time Calculation (min) (ACUTE ONLY): 17 min  Charges:  $Therapeutic Exercise: 8-22 mins                    G Codes:      Ramond Dial 08/23/2015, 4:13 PM   [name], PT MS Acute Rehab Dept. Number: [facility]  [id number] and [place] [phone]

## 2015-08-11 NOTE — Progress Notes (Signed)
Pt had episode of HR dropping to 38 and 39, no s/s VS were WNL, MD notified will continue to monitor, Thanks Arvella Nigh RN

## 2015-08-12 DIAGNOSIS — R918 Other nonspecific abnormal finding of lung field: Secondary | ICD-10-CM

## 2015-08-12 LAB — RENAL FUNCTION PANEL
ANION GAP: 9 (ref 5–15)
Albumin: 3.2 g/dL — ABNORMAL LOW (ref 3.5–5.0)
BUN: 72 mg/dL — ABNORMAL HIGH (ref 6–20)
CALCIUM: 8.8 mg/dL — AB (ref 8.9–10.3)
CO2: 28 mmol/L (ref 22–32)
Chloride: 90 mmol/L — ABNORMAL LOW (ref 101–111)
Creatinine, Ser: 2.33 mg/dL — ABNORMAL HIGH (ref 0.44–1.00)
GFR, EST AFRICAN AMERICAN: 22 mL/min — AB (ref >60)
GFR, EST NON AFRICAN AMERICAN: 19 mL/min — AB (ref >60)
Glucose, Bld: 133 mg/dL — ABNORMAL HIGH (ref 65–99)
Phosphorus: 5.6 mg/dL — ABNORMAL HIGH (ref 2.5–4.6)
Potassium: 4.3 mmol/L (ref 3.5–5.1)
SODIUM: 127 mmol/L — AB (ref 135–145)

## 2015-08-12 LAB — PROTIME-INR
INR: 1.2 (ref 0.00–1.49)
PROTHROMBIN TIME: 15.4 s — AB (ref 11.6–15.2)

## 2015-08-12 LAB — CBC
HCT: 32.4 % — ABNORMAL LOW (ref 36.0–46.0)
HEMOGLOBIN: 10.7 g/dL — AB (ref 12.0–15.0)
MCH: 33 pg (ref 26.0–34.0)
MCHC: 33 g/dL (ref 30.0–36.0)
MCV: 100 fL (ref 78.0–100.0)
Platelets: 238 10*3/uL (ref 150–400)
RBC: 3.24 MIL/uL — AB (ref 3.87–5.11)
RDW: 13.2 % (ref 11.5–15.5)
WBC: 6.9 10*3/uL (ref 4.0–10.5)

## 2015-08-12 MED ORDER — METHYLPREDNISOLONE SODIUM SUCC 125 MG IJ SOLR
60.0000 mg | Freq: Every day | INTRAMUSCULAR | Status: DC
  Administered 2015-08-12 – 2015-08-14 (×3): 60 mg via INTRAVENOUS
  Filled 2015-08-12: qty 0.96
  Filled 2015-08-12: qty 2
  Filled 2015-08-12 (×2): qty 0.96

## 2015-08-12 MED ORDER — FUROSEMIDE 10 MG/ML IJ SOLN
80.0000 mg | Freq: Once | INTRAMUSCULAR | Status: AC
  Administered 2015-08-12: 80 mg via INTRAVENOUS

## 2015-08-12 NOTE — Progress Notes (Signed)
PATIENT DETAILS Name: Colleen [name] Age: 80 y.o. Sex: female Date of Birth: October 22, 1935 Admit Date: 07/30/2015 Admitting Physician Theressa Millard, MD [id number] [name], DO  Brief narrative:   80 year old female with history of diastolic heart failure admitted on 8/7 with worsening shortness of breath. She was initially felt to have acute diastolic heart failure, and was started on IV Lasix. In spite of aggressive diuresis, and with her being below her dry weight-she continued to have shortness of breath, x-ray during this time showed worsening congestive heart failure. She subsequently underwent a high-resolution CT of the chest that showed new minus-like appearing airspace consolidation-highly suggestive of a inflammatory process/autoimmune process. PCCM was consulted, bronchoscopy was attempted-could not be completed because of worsening bradycardia. Serological workup so far is positive for a significantly elevated rheumatoid factor. PCCM is contemplating starting patient on steroids once further serological tests are back. Unfortunately latter half of the hospital course has been complicated by development of acute renal failure, likely hemodynamically mediated due to borderline hypotension. Please see below for further details  Subjective:  Patient in bed, denies any headache chest pain, does have shortness of breath but better than what she was upon admission, no focal weakness.  Assessment/Plan:   1. Acute hypoxic respiratory failure: Due to combination of acute on chronic diastolic CHF along with nonspecific inflammatory change in the lungs   A. Acute on chronic diastolic CHF. Has been adequately diuresed, cardiology on board, echocardiogram shows EF of 65% without any wall motion abnormalities, currently not on diuretic, cardiology on board and they are contemplating right heart cath  [facility] Weights   08/10/15 0458 08/11/15 0526 08/12/15 0500  Weight: 79.198 kg  (174 lb 9.6 oz) 81.33 kg (179 lb 4.8 oz) 82.3 kg (181 lb 7 oz)    B. Nonspecific lung infiltrates on CT angiogram and CT chest high resolution. No PE on CT angiogram chest and on lower extremity duplex, pulmonary on board, she is still short of breath and hypoxic, cardiology has started her on Solu-Medrol on 08/12/2015. I have requested pulmonary to reevaluate. Bronchoscopy could not be done earlier due to severe bradycardia. Continue supportive care with oxygen nebulizer treatment. We'll request pulmonary to evaluate and advise on further treatment plan. Her rheumatoid factor is high, ANA negative, anti-CCP antibody pending, pro-calcitonin unremarkable, ESR 92.      ARF with hyponatremia: No evidence of fluid overload, blood pressure borderline, placed on midodrine, renal consulted. We'll defer further management of ARF and hyponatremia to nephrology..   Sinus bradycardia: Heart rate is stable in the 50s, continue to monitor off beta blockers. History of Mobitz type II AV block as well as junctional bradycardia which is likely not contributing to her SOB per cardiology. Cardiology on board defer further management to cardiology.  Hypertension: BP now soft-diuretics on hold- on midodrine  Hypothyroidism: Continue with Synthroid  Hyperlipidemia: Resume niacin on discharge   Disposition: Remain inpatient  Antimicrobial agents  See below  Anti-infectives    None      DVT Prophylaxis: Prophylactic Lovenox   Code Status: Full code   Family Communication None at betsiytde  Procedures:  TTE  - Left ventricle: The cavity size was normal. Wall thickness wasnormal. Systolic function was vigorous. The estimated ejectionfraction was in the range of 65% to 70%. Wall motion was normal;there were no regional wall motion abnormalities. - Mitral valve: Mildly calcified annulus. There was no significantregurgitation. -  Left atrium: The atrium was moderately to severely dilated. -  Right ventricle: The cavity size was mildly dilated. Systolicfunction was normal. - Right atrium: Prominent Eustachian valve. - Atrial septum: No defect or patent foramen ovale was identified.Echo contrast study showed no right-to-left atrial level shunt, at baseline or with provocation. - Tricuspid valve: Peak RV-RA gradient (S): 38 mm Hg. - Pulmonary arteries: PA peak pressure: 46 mm Hg (S). - Systemic veins: IVC measured 2.3 cm with > 50% respirophasicvariation, suggesting RA pressure 8 mmHg.  Impressions:  Limited echo for bubble study (negative).   CONSULTS:  cardiology, pulmonary/intensive care and nephrology  Time spent 30 minutes-Greater than 50% of this time was spent in counseling, explanation of diagnosis, planning of further management, and coordination of care.  MEDICATIONS: Scheduled Meds: . enoxaparin (LOVENOX) injection  30 mg Subcutaneous Q24H  . levothyroxine  100 mcg Oral QAC breakfast  . methylPREDNISolone (SOLU-MEDROL) injection  60 mg Intravenous Daily  . midodrine  5 mg Oral TID WC  . polyethylene glycol  17 g Oral BID  . sodium chloride  3 mL Intravenous Q12H  . tiotropium  18 mcg Inhalation Daily   Continuous Infusions:  PRN Meds:.sodium chloride, acetaminophen, calcium carbonate, fentaNYL, lidocaine, midazolam, ondansetron (ZOFRAN) IV, sodium chloride    PHYSICAL EXAM: Vital signs in last 24 hours: Filed Vitals:   08/11/15 1831 08/11/15 2121 08/12/15 0500 08/12/15 0809  BP: 124/38 123/40 110/45   Pulse: 51 53 50   Temp:  98.7 F (37.1 C) 98.2 F (36.8 C)   TempSrc:  Oral Oral   Resp:  18 18   Height:      Weight:   82.3 kg (181 lb 7 oz)   SpO2:  95% 95% 93%    Weight change: 0.97 kg (2 lb 2.2 oz) Filed Weights   08/10/15 0458 08/11/15 0526 08/12/15 0500  Weight: 79.198 kg (174 lb 9.6 oz) 81.33 kg (179 lb 4.8 oz) 82.3 kg (181 lb 7 oz)   Body mass index is 30.19 kg/(m^2).   Gen Exam: Awake and alert with clear speech.  Neck: Supple,  No JVD.   Chest: B/L Clear except for a few bibasilar rales CVS: S1 S2 Regular, no murmurs.  Abdomen: soft, BS +, non tender, non distended.  Extremities: no edema, lower extremities warm to touch. Neurologic: Non Focal.   Skin: No Rash.   Wounds: N/A.   Intake/Output from previous day:  Intake/Output Summary (Last 24 hours) at 08/12/15 1044 Last data filed at 08/12/15 1025  Gross per 24 hour  Intake    960 ml  Output    770 ml  Net    190 ml     LAB RESULTS: CBC  Recent Labs Lab 08/07/15 0315  WBC 7.0  HGB 11.1*  HCT 34.1*  PLT 258  MCV 102.1*  MCH 33.2  MCHC 32.6  RDW 13.0    Chemistries   Recent Labs Lab 08/08/15 0356 08/09/15 0351 08/10/15 0510 08/11/15 0526 08/12/15 0305  NA 134* 133* 134* 126* 127*  K 3.2* 2.9* 3.8 4.7 4.3  CL 82* 84* 88* 88* 90*  CO2 41* 37* 35* 25 28  GLUCOSE 120* 119* 94 90 133*  BUN 35* 41* 46* 65* 72*  CREATININE 1.17* 1.07* 2.23* 3.85* 2.33*  CALCIUM 9.3 9.2 9.1 8.6* 8.8*  MG  --  2.2  --   --   --     CBG: No results for input(s): GLUCAP in the last 168 hours.  GFR Estimated Creatinine Clearance: 21.4 mL/min (by C-G formula based on Cr of 2.33).  Coagulation profile No results for input(s): INR, PROTIME in the last 168 hours.  Cardiac Enzymes No results for input(s): CKMB, TROPONINI, MYOGLOBIN in the last 168 hours.  Invalid input(s): CK  Invalid input(s): POCBNP No results for input(s): DDIMER in the last 72 hours. No results for input(s): HGBA1C in the last 72 hours. No results for input(s): CHOL, HDL, LDLCALC, TRIG, CHOLHDL, LDLDIRECT in the last 72 hours. No results for input(s): TSH, T4TOTAL, T3FREE, THYROIDAB in the last 72 hours.  Invalid input(s): FREET3 No results for input(s): VITAMINB12, FOLATE, FERRITIN, TIBC, IRON, RETICCTPCT in the last 72 hours. No results for input(s): LIPASE, AMYLASE in the last 72 hours.  Urine Studies No results for input(s): UHGB, CRYS in the last 72 hours.  Invalid  input(s): UACOL, UAPR, USPG, UPH, UTP, UGL, UKET, UBIL, UNIT, UROB, ULEU, UEPI, UWBC, URBC, UBAC, CAST, UCOM, BILUA  MICROBIOLOGY: No results found for this or any previous visit (from the past 240 hour(s)).  RADIOLOGY STUDIES/RESULTS: Dg Chest 2 View  08/04/2015   CLINICAL DATA:  Shortness of breath, low oxygen saturation  EXAM: CHEST  2 VIEW  COMPARISON:  07/30/2015  FINDINGS: There is stable cardiomegaly. There are trace bilateral pleural effusions. There is bilateral diffuse interstitial thickening. There is no pneumothorax. There is no acute osseous abnormality.  IMPRESSION: Findings most consistent with mild CHF.   Electronically Signed   By: Kathreen Devoid   On: 08/04/2015 12:36   Dg Chest 2 View  07/22/2015   CLINICAL DATA:  Shortness of breath, dry cough.  EXAM: CHEST  2 VIEW  COMPARISON:  02/20/2015  FINDINGS: There is mild cardiomegaly and vascular congestion. No overt edema. No confluent opacities or effusions. No acute bony abnormality.  IMPRESSION: Cardiomegaly, vascular congestion.   Electronically Signed   By: Rolm Baptise M.D.   On: 07/22/2015 18:48   Ct Angio Chest Pe W/cm &/or Wo Cm  07/28/2015   CLINICAL DATA:  Shortness breath for 2 weeks. Personal history of smoking. The patient quit smoking 15 years ago.  EXAM: CT ANGIOGRAPHY CHEST WITH CONTRAST  TECHNIQUE: Multidetector CT imaging of the chest was performed using the standard protocol during bolus administration of intravenous contrast. Multiplanar CT image reconstructions and MIPs were obtained to evaluate the vascular anatomy.  CONTRAST:  130m OMNIPAQUE IOHEXOL 350 MG/ML SOLN  COMPARISON:  Two-view chest x-ray 07/22/2015.  FINDINGS: Pulmonary arterial opacification is satisfactory. The study is mildly degraded by patient breathing motion. No focal filling defects are evident to suggest pulmonary emboli.  The heart is mildly enlarged. Coronary artery calcifications are present. There is no significant pericardial effusion.  A  precarinal lymph node measures 1.2 cm in short axis. Subcentimeter prevascular lymph nodes are present. No other pathologically enlarged nodes are present. No significant axillary nodes are present.  Small moderate bilateral pleural effusions are present. There is some associated atelectasis.  Diffuse ground-glass attenuation is present throughout both lungs, likely representing edema. No significant airspace consolidation is present.  The bone windows demonstrate exaggerated kyphosis in the lower thoracic spine. No focal lytic or blastic lesions are present.  Review of the MIP images confirms the above findings.  IMPRESSION: 1. No evidence for pulmonary embolus. 2. Cardiomegaly, interstitial edema, and bilateral pleural effusions are compatible with congestive heart failure. 3. No focal airspace opacification other than dependent atelectasis. 4. Enlarged precarinal lymph node. Other smaller mediastinal nodes are present as well.  These are likely reactive in the setting of congestive heart failure. These results will be called to the ordering clinician or representative by the Radiologist Assistant, and communication documented in the PACS or zVision Dashboard.   Electronically Signed   By: San Morelle M.D.   On: 07/28/2015 11:15   Ct Chest High Resolution  08/07/2015   CLINICAL DATA:  80 year old female with history of diastolic congestive heart failure. Dyspnea on exertion. Weight gain. Orthopnea new increasing edema. History of asbestos exposure.  EXAM: CT CHEST WITHOUT CONTRAST  TECHNIQUE: Multidetector CT imaging of the chest was performed following the standard protocol without intravenous contrast. High resolution imaging of the lungs, as well as inspiratory and expiratory imaging, was performed.  COMPARISON:  Chest CT 07/28/2015.  FINDINGS: Mediastinum/Lymph Nodes: Heart size is mildly enlarged. There is no significant pericardial fluid, thickening or pericardial calcification. There is  atherosclerosis of the thoracic aorta, the great vessels of the mediastinum and the coronary arteries, including calcified atherosclerotic plaque in the left anterior descending and right coronary arteries. Calcifications of the mitral annulus. Calcifications of the aortic valve. Multiple prominent borderline enlarged and mildly enlarged mediastinal lymph nodes, measuring up to 12 mm in short axis in the low right paratracheal station (unchanged). Esophagus is unremarkable in appearance. No axillary lymphadenopathy.  Lungs/Pleura: Compared to the prior examination from 07/28/2015 there are multiple nodular and mass-like areas of peribronchovascular airspace consolidation now noted in the lower lobes of the lungs bilaterally, presumably infectious or inflammatory in etiology given their rapid development. There are additional patchy areas of ground-glass attenuation scattered throughout the lungs bilaterally, most confluent in the apex of the right upper lobe. Background of mild interlobular septal thickening, suggestive of a background of mild interstitial pulmonary edema. Previously noted small bilateral pleural effusions have nearly completely resolved (trace bilateral pleural effusions are noted on today's examination). Evaluation of high-resolution images is limited given the acute findings on today's examination. There are some areas of mild subpleural reticulation, however, this could simply be related to underlying edema. No calcified pleural plaques. Inspiratory and expiratory imaging demonstrates extensive air trapping, indicative of small airways disease.  Upper Abdomen: Status post cholecystectomy. Slightly nodular contour of the liver, could indicate early changes of cirrhosis. Atherosclerosis.  Musculoskeletal/Soft Tissues: There are no aggressive appearing lytic or blastic lesions noted in the visualized portions of the skeleton.  IMPRESSION: 1. Assessment for underlying interstitial lung disease is  significantly limited on today's examination secondary to multiple acute findings, as discussed above. No overt changes suggestive of interstitial lung disease are confidently identified. If there continues to be clinical concern for interstitial lung disease, follow-up outpatient evaluation with high-resolution chest CT in 6-12 months would be recommended. 2. Interval development of multifocal peribronchovascular nodular and mass-like appearing airspace consolidation most evident in the lower lobes of the lungs bilaterally (left greater than right), presumably infectious or inflammatory in etiology. 3. Extensive air trapping, indicative of small airways disease. 4. No calcified pleural plaques to suggest asbestos related pleural disease. 5. Mild diffuse interlobular septal thickening, likely to reflect a background of mild interstitial pulmonary edema. Small bilateral pleural effusions have nearly completely resolved compared to the prior examination. 6. Mild cardiomegaly. 7. Atherosclerosis, including 2 vessel coronary artery disease. Assessment for potential risk factor modification, dietary therapy or pharmacologic therapy may be warranted, if clinically indicated. 8. Additional incidental findings, as above.   Electronically Signed   By: Vinnie Langton M.D.   On: 08/07/2015 12:29   Dg Chest  Port 1 View  08/07/2015   CLINICAL DATA:  Shortness of Breath  EXAM: PORTABLE CHEST - 1 VIEW  COMPARISON:  08/04/2015  FINDINGS: Cardiac shadow remains enlarged. Prominent vascular congestion is again right greater than left with interstitial changes changes have progressed slightly in the interval from the prior exam although some of this may be technically related. Aortic calcifications are again seen. The bony structures are stable.  IMPRESSION: Changes consistent with CHF which have worsened slightly in the interval from the prior exam.   Electronically Signed   By: Inez Catalina M.D.   On: 08/07/2015 07:39   Dg  Chest Port 1 View  07/30/2015   CLINICAL DATA:  Patient with shortness of breath since last Saturday. Recent diagnosis congestive heart failure.  EXAM: PORTABLE CHEST - 1 VIEW  COMPARISON:  Chest CT 07/28/2015; chest radiograph 07/22/2015  FINDINGS: Monitoring leads overlie the patient. Cardiomegaly. Moderate left and small right pleural effusions. Bilateral perihilar interstitial pulmonary opacities. No pneumothorax.  IMPRESSION: Cardiomegaly with increasing interstitial opacities concerning for pulmonary edema. Moderate left and small right pleural effusions.   Electronically Signed   By: Lovey Newcomer M.D.   On: 07/30/2015 21:18    Thurnell Lose, MD  [facility] Pager:[phone]  If 7PM-7AM, please contact night-coverage [email or web address] Password [email or web address] 08/12/2015, 10:44 AM   LOS: 13 days

## 2015-08-12 NOTE — Progress Notes (Signed)
PULMONARY / CRITICAL CARE MEDICINE   Name: Colleen [name] MRN: [id number] DOB: [birth year]   PCP Garnet Koyanagi, DO   ADMISSION DATE:  07/30/2015 CONSULTATION DATE:  08/07/15 LOS 6 das   REFERRING MD :  Dr Lovena Neighbours  CHIEF COMPLAINT:  Dyspnea, hypoxemia  SUBJECTIVE: Increased dyspnea today. Feeling distinctly better already since starting solumedrol today.  ROS: No nausea or vomiting. Reports minimal urine output. No abdominal pain. No fever, chills, or sweats.  VITAL SIGNS: Temp:  [98.2 F (36.8 C)-99.1 F (37.3 C)] 99.1 F (37.3 C) (08/20 1417) Pulse Rate:  [50-53] 51 (08/20 1417) Resp:  [18-20] 20 (08/20 1417) BP: (110-129)/(38-47) 129/47 mmHg (08/20 1417) SpO2:  [93 %-95 %] 95 % (08/20 1417) Weight:  [82.3 kg (181 lb 7 oz)] 82.3 kg (181 lb 7 oz) (08/20 0500) HEMODYNAMICS:   VENTILATOR SETTINGS:   INTAKE / OUTPUT:  Intake/Output Summary (Last 24 hours) at 08/12/15 1704 Last data filed at 08/12/15 1500  Gross per 24 hour  Intake    720 ml  Output    870 ml  Net   -150 ml    PHYSICAL EXAMINATION: General:  Awake. Alert. Up in chair, relaxed and conversational with visitor. Integument:  Warm & dry. No rash. HEENT:  Moist mucus membranes. No oral ulcers. No scleral icterus. Cardiovascular:  Regular rate. No edema.  Pulmonary:  Clear bilaterally to auscultation. Normal WOB on [place] oxygen. Abdomen: Soft. Normal bowel sounds. Mildly protuberant. Grossly nontender.  LABS:  CBC  Recent Labs Lab 08/07/15 0315 08/12/15 1211  WBC 7.0 6.9  HGB 11.1* 10.7*  HCT 34.1* 32.4*  PLT 258 238   Coag's  Recent Labs Lab 08/12/15 1211  INR 1.20   BMET  Recent Labs Lab 08/10/15 0510 08/11/15 0526 08/12/15 0305  NA 134* 126* 127*  K 3.8 4.7 4.3  CL 88* 88* 90*  CO2 35* 25 28  BUN 46* 65* 72*  CREATININE 2.23* 3.85* 2.33*  GLUCOSE 94 90 133*   Electrolytes  Recent Labs Lab 08/09/15 0351 08/10/15 0510 08/11/15 0526 08/12/15 0305  CALCIUM 9.2 9.1 8.6* 8.8*   MG 2.2  --   --   --   PHOS  --   --   --  5.6*   Sepsis Markers  Recent Labs Lab 08/07/15 1222 08/09/15 0351 08/11/15 0526  PROCALCITON <0.10 <0.10 0.24   ABG No results for input(s): PHART, PCO2ART, PO2ART in the last 168 hours. Liver Enzymes  Recent Labs Lab 08/12/15 0305  ALBUMIN 3.2*   Cardiac Enzymes No results for input(s): TROPONINI, PROBNP in the last 168 hours. Glucose No results for input(s): GLUCAP in the last 168 hours.  Imaging No results found.   ASSESSMENT / PLAN: 1. Bilateral pneumonia: Unable to perform bronchoscopy and lavage secondary to oxygen requirement. ANA negative. Reordering Anti-CCP. She was distinctly more SOB this AM and I was asked to see. Very appropriate that steroids were started. This would be quite brisk response to steroids, but she now feels much better, so will continue. I will see her again tomorrow. CXR ordered for AM. 2. Possible Rheumatoid Arthritis:  Continues to have no joint complaints. Awaiting Anti-CCP. 3. Acute hypoxic respiratory failure: Diuresis ceased secondary to acute kidney injury/acute renal failure. Continuing to wean FiO2 for Sat >92%. 4. Acute Renal Failure:  Likely due to diuresis. Per primary service. U/A pending. 5. Hypokalemia: Resolved. 6. Followup:  Patient scheduled for followup with me on 8/26 at 11:30am.    CD Young,  MD, . [facility]  After 3pm or if no response, call [phone] 08/12/2015, 5:04 PM

## 2015-08-12 NOTE — Progress Notes (Signed)
Pt would prefer to have the cath in the R radial rather than the R groin, she had a very bad experience from a previous cardiac cath in the R groin, thanks Arvella Nigh RN.

## 2015-08-12 NOTE — Progress Notes (Signed)
Patient ID: KAILENE STEINHART, female   DOB: October 27, 1935, 80 y.o.   MRN: [id number]   Patient Name: Colleen [name] Date of Encounter: 08/12/2015     Principal Problem:   Acute diastolic CHF (congestive heart failure) Active Problems:   Hypothyroidism   Hyperlipidemia   Essential hypertension   Acute respiratory failure with hypoxia   AKI (acute kidney injury)   Sinus bradycardia   Hyponatremia   Acute kidney injury (nontraumatic)   Acute on chronic respiratory failure   Pulmonary infiltrate    SUBJECTIVE  Frustrated at lack of diagnosis.  Breathing ok urinating better   CURRENT MEDS . enoxaparin (LOVENOX) injection  30 mg Subcutaneous Q24H  . levothyroxine  100 mcg Oral QAC breakfast  . midodrine  5 mg Oral TID WC  . polyethylene glycol  17 g Oral BID  . sodium chloride  3 mL Intravenous Q12H  . tiotropium  18 mcg Inhalation Daily    OBJECTIVE  Filed Vitals:   08/11/15 1831 08/11/15 2121 08/12/15 0500 08/12/15 0809  BP: 124/38 123/40 110/45   Pulse: 51 53 50   Temp:  98.7 F (37.1 C) 98.2 F (36.8 C)   TempSrc:  Oral Oral   Resp:  18 18   Height:      Weight:   82.3 kg (181 lb 7 oz)   SpO2:  95% 95% 93%    Intake/Output Summary (Last 24 hours) at 08/12/15 1034 Last data filed at 08/12/15 1025  Gross per 24 hour  Intake    960 ml  Output    770 ml  Net    190 ml   Filed Weights   08/10/15 0458 08/11/15 0526 08/12/15 0500  Weight: 79.198 kg (174 lb 9.6 oz) 81.33 kg (179 lb 4.8 oz) 82.3 kg (181 lb 7 oz)    PHYSICAL EXAM   PE: General:Pleasant affect, NAD Skin:Warm and dry, brisk capillary refill HEENT:normocephalic, sclera clear, mucus membranes moist Neck:supple, no JVD Heart:S1S2 RRR SEM murmur, gallup, rub or click Lungs: few rales, rhonchi, or wheezes OTL:XBWI, non tender, + BS, do not palpate liver spleen or masses Ext:no lower ext edema, 2+ pedal pulses, 2+ radial pulses Neuro:alert and oriented X 3, MAE, follows commands, + facial  symmetry  Accessory Clinical Findings  CBC Basic Metabolic Panel  Recent Labs  08/11/15 0526 08/12/15 0305  NA 126* 127*  K 4.7 4.3  CL 88* 90*  CO2 25 28  GLUCOSE 90 133*  BUN 65* 72*  CREATININE 3.85* 2.33*  CALCIUM 8.6* 8.8*  PHOS  --  5.6*    TELE  Sinus brady with HR in high 40s and 50s.   Radiology/Studies  Dg Chest 2 View  08/04/2015   CLINICAL DATA:  Shortness of breath, low oxygen saturation  EXAM: CHEST  2 VIEW  COMPARISON:  07/30/2015  FINDINGS: There is stable cardiomegaly. There are trace bilateral pleural effusions. There is bilateral diffuse interstitial thickening. There is no pneumothorax. There is no acute osseous abnormality.  IMPRESSION: Findings most consistent with mild CHF.   Electronically Signed   By: Kathreen Devoid   On: 08/04/2015 12:36   Dg Chest 2 View  07/22/2015   CLINICAL DATA:  Shortness of breath, dry cough.  EXAM: CHEST  2 VIEW  COMPARISON:  02/20/2015  FINDINGS: There is mild cardiomegaly and vascular congestion. No overt edema. No confluent opacities or effusions. No acute bony abnormality.  IMPRESSION: Cardiomegaly, vascular congestion.   Electronically Signed   By: Lennette Bihari  [place] M.D.   On: 07/22/2015 18:48   Ct Angio Chest Pe W/cm &/or Wo Cm  07/28/2015   CLINICAL DATA:  Shortness breath for 2 weeks. Personal history of smoking. The patient quit smoking 15 years ago.  EXAM: CT ANGIOGRAPHY CHEST WITH CONTRAST  TECHNIQUE: Multidetector CT imaging of the chest was performed using the standard protocol during bolus administration of intravenous contrast. Multiplanar CT image reconstructions and MIPs were obtained to evaluate the vascular anatomy.  CONTRAST:  151m OMNIPAQUE IOHEXOL 350 MG/ML SOLN  COMPARISON:  Two-view chest x-ray 07/22/2015.  FINDINGS: Pulmonary arterial opacification is satisfactory. The study is mildly degraded by patient breathing motion. No focal filling defects are evident to suggest pulmonary emboli.  The heart is mildly  enlarged. Coronary artery calcifications are present. There is no significant pericardial effusion.  A precarinal lymph node measures 1.2 cm in short axis. Subcentimeter prevascular lymph nodes are present. No other pathologically enlarged nodes are present. No significant axillary nodes are present.  Small moderate bilateral pleural effusions are present. There is some associated atelectasis.  Diffuse ground-glass attenuation is present throughout both lungs, likely representing edema. No significant airspace consolidation is present.  The bone windows demonstrate exaggerated kyphosis in the lower thoracic spine. No focal lytic or blastic lesions are present.  Review of the MIP images confirms the above findings.  IMPRESSION: 1. No evidence for pulmonary embolus. 2. Cardiomegaly, interstitial edema, and bilateral pleural effusions are compatible with congestive heart failure. 3. No focal airspace opacification other than dependent atelectasis. 4. Enlarged precarinal lymph node. Other smaller mediastinal nodes are present as well. These are likely reactive in the setting of congestive heart failure. These results will be called to the ordering clinician or representative by the Radiologist Assistant, and communication documented in the PACS or zVision Dashboard.   Electronically Signed   By: CSan MorelleM.D.   On: 07/28/2015 11:15   Ct Chest High Resolution  08/07/2015   CLINICAL DATA:  80year old female with history of diastolic congestive heart failure. Dyspnea on exertion. Weight gain. Orthopnea new increasing edema. History of asbestos exposure.  EXAM: CT CHEST WITHOUT CONTRAST  TECHNIQUE: Multidetector CT imaging of the chest was performed following the standard protocol without intravenous contrast. High resolution imaging of the lungs, as well as inspiratory and expiratory imaging, was performed.  COMPARISON:  Chest CT 07/28/2015.  FINDINGS: Mediastinum/Lymph Nodes: Heart size is mildly enlarged.  There is no significant pericardial fluid, thickening or pericardial calcification. There is atherosclerosis of the thoracic aorta, the great vessels of the mediastinum and the coronary arteries, including calcified atherosclerotic plaque in the left anterior descending and right coronary arteries. Calcifications of the mitral annulus. Calcifications of the aortic valve. Multiple prominent borderline enlarged and mildly enlarged mediastinal lymph nodes, measuring up to 12 mm in short axis in the low right paratracheal station (unchanged). Esophagus is unremarkable in appearance. No axillary lymphadenopathy.  Lungs/Pleura: Compared to the prior examination from 07/28/2015 there are multiple nodular and mass-like areas of peribronchovascular airspace consolidation now noted in the lower lobes of the lungs bilaterally, presumably infectious or inflammatory in etiology given their rapid development. There are additional patchy areas of ground-glass attenuation scattered throughout the lungs bilaterally, most confluent in the apex of the right upper lobe. Background of mild interlobular septal thickening, suggestive of a background of mild interstitial pulmonary edema. Previously noted small bilateral pleural effusions have nearly completely resolved (trace bilateral pleural effusions are noted on today's examination). Evaluation of high-resolution images is  limited given the acute findings on today's examination. There are some areas of mild subpleural reticulation, however, this could simply be related to underlying edema. No calcified pleural plaques. Inspiratory and expiratory imaging demonstrates extensive air trapping, indicative of small airways disease.  Upper Abdomen: Status post cholecystectomy. Slightly nodular contour of the liver, could indicate early changes of cirrhosis. Atherosclerosis.  Musculoskeletal/Soft Tissues: There are no aggressive appearing lytic or blastic lesions noted in the visualized  portions of the skeleton.  IMPRESSION: 1. Assessment for underlying interstitial lung disease is significantly limited on today's examination secondary to multiple acute findings, as discussed above. No overt changes suggestive of interstitial lung disease are confidently identified. If there continues to be clinical concern for interstitial lung disease, follow-up outpatient evaluation with high-resolution chest CT in 6-12 months would be recommended. 2. Interval development of multifocal peribronchovascular nodular and mass-like appearing airspace consolidation most evident in the lower lobes of the lungs bilaterally (left greater than right), presumably infectious or inflammatory in etiology. 3. Extensive air trapping, indicative of small airways disease. 4. No calcified pleural plaques to suggest asbestos related pleural disease. 5. Mild diffuse interlobular septal thickening, likely to reflect a background of mild interstitial pulmonary edema. Small bilateral pleural effusions have nearly completely resolved compared to the prior examination. 6. Mild cardiomegaly. 7. Atherosclerosis, including 2 vessel coronary artery disease. Assessment for potential risk factor modification, dietary therapy or pharmacologic therapy may be warranted, if clinically indicated. 8. Additional incidental findings, as above.   Electronically Signed   By: Vinnie Langton M.D.   On: 08/07/2015 12:29   Dg Chest Port 1 View  08/07/2015   CLINICAL DATA:  Shortness of Breath  EXAM: PORTABLE CHEST - 1 VIEW  COMPARISON:  08/04/2015  FINDINGS: Cardiac shadow remains enlarged. Prominent vascular congestion is again right greater than left with interstitial changes changes have progressed slightly in the interval from the prior exam although some of this may be technically related. Aortic calcifications are again seen. The bony structures are stable.  IMPRESSION: Changes consistent with CHF which have worsened slightly in the interval from  the prior exam.   Electronically Signed   By: Inez Catalina M.D.   On: 08/07/2015 07:39   Dg Chest Port 1 View  07/30/2015   CLINICAL DATA:  Patient with shortness of breath since last Saturday. Recent diagnosis congestive heart failure.  EXAM: PORTABLE CHEST - 1 VIEW  COMPARISON:  Chest CT 07/28/2015; chest radiograph 07/22/2015  FINDINGS: Monitoring leads overlie the patient. Cardiomegaly. Moderate left and small right pleural effusions. Bilateral perihilar interstitial pulmonary opacities. No pneumothorax.  IMPRESSION: Cardiomegaly with increasing interstitial opacities concerning for pulmonary edema. Moderate left and small right pleural effusions.   Electronically Signed   By: Lovey Newcomer M.D.   On: 07/30/2015 21:18   High Resolution CT 08/07/15:  IMPRESSION: 1. Assessment for underlying interstitial lung disease is significantly limited on today's examination secondary to multiple acute findings, as discussed above. No overt changes suggestive of interstitial lung disease are confidently identified. If there continues to be clinical concern for interstitial lung disease, follow-up outpatient evaluation with high-resolution chest CT in 6-12 months would be recommended. 2. Interval development of multifocal peribronchovascular nodular and mass-like appearing airspace consolidation most evident in the lower lobes of the lungs bilaterally (left greater than right), presumably infectious or inflammatory in etiology. 3. Extensive air trapping, indicative of small airways disease. 4. No calcified pleural plaques to suggest asbestos related pleural disease. 5. Mild diffuse interlobular septal  thickening, likely to reflect a background of mild interstitial pulmonary edema. Small bilateral pleural effusions have nearly completely resolved compared to the prior examination. 6. Mild cardiomegaly. 7. Atherosclerosis, including 2 vessel coronary artery disease. Assessment for potential risk factor  modification, dietary therapy or pharmacologic therapy may be warranted, if clinically indicated. 8. Additional incidental findings, as above.   ASSESSMENT AND PLAN  Ms. Casselman is a 80 yo female with PMHx of diastolic CHF (echo 12/28/08 EF 65-70%), HTN, Hypothyroidism, and sinus bradycardia who presented to the ED on 07/30/15 with complaint of DOE, weight gain, orthopnea and increased edema. W/u including CXR and BNP c/w acute CHF exacerbation and she has been diuresed. She has had continued problems with dyspnea and hypoxemia out of proportion to her cardiac disease and pulmonology has been consulted.   Dyspnea/Hypoxia- patient seen by PCCM. CT shows infiltrates of unknown etiology (viral or spontaneous clearing of HCAP or BOOP/ILD).  -- ECHO with bubble study negative for cardiac or pulmonary shunt. -- PCCM suspects underlying inflammatory process that is autoimmune in etiology given her elevated ESR & rheumatoid factor. An infectious etiology cannot be completely ruled out despite negative Procalcitonin from her serum. Serum ANA & anti-CCP are pending.  Procalcitonin remains undetectable indicating low probability for an infectious etiology.  This is not a cardiac issue.  She has a sed rate of 92 and positive RF.  I think she should be on steroids but no one has started them?  Pulmonary ? IM ? Now that her Cr is elevated discussed right heart cath with her to measure pressures and see where with stand with EDP and PA pressure Will start on solumedrol 60 mg daily.  Plan right heart cath for Monday.  Not clear why weights are up 10 lbs but has had 10 L overall diuresis   A/C Diastolic CHF: EF normal at 65-70%.   See above right heart planned for tomorrow   HTN: BP is well controlled- a little soft currently. Her home Prinzide has been held since admission. I would continue to hold this at discharge. BP meds can be re-evaluated as an outpatient  Acute Kidney Injury:  See nephrology note.   Although diuresis, ACE and low BP likely explanation ? Goodpastures or other pulmonary/renal syndrome  Hypokalemia: repleted  Sinus bradycardia- She has had bradycardia for a long time and atenolol stopped 2 weeks ago. This is not felt to be contributing to her SOB.    Signed, Jenkins Rouge PA-C

## 2015-08-12 NOTE — Progress Notes (Signed)
Patient ambulates to bathroom to void. She miss the measuring hat, putting most urine output in the toilet. Offered to place a second measuring hat in the back of the toilet to catch all urine output but patient refused because she doesn't want stool to be caught in the hat because she stools most when she voids.

## 2015-08-12 NOTE — Progress Notes (Signed)
Subjective: Interval History: breathing is the same, some SOB. No cp. No leg edema currently.   Objective: Vital signs in last 24 hours: Temp:  [98 F (36.7 C)-98.7 F (37.1 C)] 98.2 F (36.8 C) (08/20 0500) Pulse Rate:  [50-53] 50 (08/20 0500) Resp:  [16-18] 18 (08/20 0500) BP: (110-129)/(38-45) 110/45 mmHg (08/20 0500) SpO2:  [93 %-96 %] 93 % (08/20 0809) Weight:  [181 lb 7 oz (82.3 kg)] 181 lb 7 oz (82.3 kg) (08/20 0500) Weight change: 2 lb 2.2 oz (0.97 kg)  Intake/Output from previous day: 08/19 0701 - 08/20 0700 In: 720 [P.O.:720] Out: 570 [Urine:570] Intake/Output this shift: Total I/O In: 480 [P.O.:480] Out: -   General: sitting up, pleasant HEENT: [place]/at, eomi, perrla Lungs: ctab. Heart: rrr, no m/r/g. Mild JVD.  Abd: soft, nt, nd Ext: no edema.   Lab Results: No results for input(s): WBC, HGB, HCT, PLT in the last 72 hours. BMET:  Recent Labs  08/11/15 0526 08/12/15 0305  NA 126* 127*  K 4.7 4.3  CL 88* 90*  CO2 25 28  GLUCOSE 90 133*  BUN 65* 72*  CREATININE 3.85* 2.33*  CALCIUM 8.6* 8.8*   No results for input(s): PTH in the last 72 hours. Iron Studies: No results for input(s): IRON, TIBC, TRANSFERRIN, FERRITIN in the last 72 hours. Studies/Results: No results found.  I have reviewed the patient's current medications.  Assessment/Plan: 80 yo female with diastolic CHF, morbitz II AV block and junctional brady, here with hypoxic resp failure and AKI.  1. AKI - Likely 2/2 to hemodynamically mediated etiology (BP dropped to 80's) + Ace Inhibitor (last dose 8/17) + CHF. Could also have some obstructive component as having incomplete bladder emptying. Baseline Crt 0.86. Was initially high likely from cardiorenal, which improved, but then went up from 1.07 >> 2.23 >> 3.85. This around the same time when her BP dropped to as low as 83/30 on 8/18, also had last dose of Lisinopril on 8/17. UA shows many leuks. HIV neg, ANA neg. Improving to 2.33 today.  This  is not Pulmonary-renal etiology given no protein in urine and improvement with lasix.  2. Hyponatremia - likely hypervolemic hyponatremia as sodium 134>>126 after stopping lasix when her weight 170 >>179. Improving slowly with lasix.  3. Acute hypoxic resp failure - likely combination of volume overload and possible inflammatory lung disease.  Does appear to be overloaded based on exam and weight currently. ESR 92, RA factor 57.8 (could be nonspecific - no RA symptoms). Cards started steroid.    Plan:  1. Continue lasix today at same 54m IV once dose. 2. Agree with trial of steroid in case she has superimposed inflammatory lung disease.  3. Avoid ACE I and other nephrotoxins.  4. Monitor kidney function.    LOS: 13 days   Ahmed, Tasrif 08/12/2015,8:29 AM  Renal Attending I agree with note as articulated above. We will continue as outline above. Annibelle Brazie C

## 2015-08-13 ENCOUNTER — Inpatient Hospital Stay (HOSPITAL_COMMUNITY): Payer: Medicare Other

## 2015-08-13 LAB — BASIC METABOLIC PANEL
ANION GAP: 10 (ref 5–15)
BUN: 65 mg/dL — ABNORMAL HIGH (ref 6–20)
CALCIUM: 9.2 mg/dL (ref 8.9–10.3)
CO2: 27 mmol/L (ref 22–32)
Chloride: 91 mmol/L — ABNORMAL LOW (ref 101–111)
Creatinine, Ser: 1.34 mg/dL — ABNORMAL HIGH (ref 0.44–1.00)
GFR, EST AFRICAN AMERICAN: 43 mL/min — AB (ref >60)
GFR, EST NON AFRICAN AMERICAN: 37 mL/min — AB (ref >60)
GLUCOSE: 147 mg/dL — AB (ref 65–99)
Potassium: 4.6 mmol/L (ref 3.5–5.1)
Sodium: 128 mmol/L — ABNORMAL LOW (ref 135–145)

## 2015-08-13 LAB — TSH: TSH: 1.011 u[IU]/mL (ref 0.350–4.500)

## 2015-08-13 LAB — MAGNESIUM: Magnesium: 2.3 mg/dL (ref 1.7–2.4)

## 2015-08-13 MED ORDER — SODIUM CHLORIDE 0.9 % IJ SOLN: 3.0000 mL | INTRAMUSCULAR | Status: DC | PRN

## 2015-08-13 MED ORDER — SODIUM CHLORIDE 0.9 % IV SOLN: 250.0000 mL | INTRAVENOUS | Status: DC | PRN

## 2015-08-13 MED ORDER — SODIUM CHLORIDE 0.9 % IJ SOLN
3.0000 mL | Freq: Two times a day (BID) | INTRAMUSCULAR | Status: DC
  Administered 2015-08-13 – 2015-08-14 (×2): 3 mL via INTRAVENOUS

## 2015-08-13 MED ORDER — DIPHENHYDRAMINE HCL 25 MG PO CAPS
25.0000 mg | ORAL_CAPSULE | Freq: Once | ORAL | Status: AC
  Administered 2015-08-14: 25 mg via ORAL
  Filled 2015-08-13: qty 1

## 2015-08-13 MED ORDER — SODIUM CHLORIDE 0.9 % WEIGHT BASED INFUSION
3.0000 mL/kg/h | INTRAVENOUS | Status: AC
  Administered 2015-08-14: 3 mL/kg/h via INTRAVENOUS

## 2015-08-13 MED ORDER — FUROSEMIDE 10 MG/ML IJ SOLN
80.0000 mg | Freq: Once | INTRAMUSCULAR | Status: AC
  Administered 2015-08-13: 80 mg via INTRAVENOUS

## 2015-08-13 MED ORDER — SODIUM CHLORIDE 0.9 % WEIGHT BASED INFUSION
1.0000 mL/kg/h | INTRAVENOUS | Status: DC
  Administered 2015-08-14: 1 mL/kg/h via INTRAVENOUS

## 2015-08-13 MED ORDER — FUROSEMIDE 10 MG/ML IJ SOLN
80.0000 mg | Freq: Two times a day (BID) | INTRAMUSCULAR | Status: DC
  Administered 2015-08-13: 80 mg via INTRAVENOUS
  Filled 2015-08-13 (×3): qty 8

## 2015-08-13 MED ORDER — ASPIRIN 81 MG PO CHEW
81.0000 mg | CHEWABLE_TABLET | ORAL | Status: AC
  Administered 2015-08-14: 81 mg via ORAL
  Filled 2015-08-13: qty 1

## 2015-08-13 NOTE — Progress Notes (Signed)
Subjective: Interval History: feels breathing improved significantly. Denies any complaint currently. Objective: Vital signs in last 24 hours: Temp:  [97.6 F (36.4 C)-99.1 F (37.3 C)] 98.4 F (36.9 C) (08/21 0918) Pulse Rate:  [37-54] 54 (08/21 0918) Resp:  [18-20] 18 (08/21 0629) BP: (129-140)/(46-65) 138/46 mmHg (08/21 0918) SpO2:  [92 %-95 %] 92 % (08/21 0918) FiO2 (%):  [93 %] 93 % (08/21 0741) Weight:  [179 lb 10.8 oz (81.5 kg)] 179 lb 10.8 oz (81.5 kg) (08/21 0629) Weight change: -1 lb 12.2 oz (-0.8 kg)  Intake/Output from previous day: 08/20 0701 - 08/21 0700 In: 980 [P.O.:980] Out: 1225 [Urine:1225] Intake/Output this shift:    General: sitting up, pleasant HEENT: [place]/at, eomi, perrla Lungs: ctab. Heart: rrr, no m/r/g. Mild JVD.  Abd: soft, nt, nd Ext: no edema.   Lab Results:  Recent Labs  08/12/15 1211  WBC 6.9  HGB 10.7*  HCT 32.4*  PLT 238   BMET:   Recent Labs  08/12/15 0305 08/13/15 0435  NA 127* 128*  K 4.3 4.6  CL 90* 91*  CO2 28 27  GLUCOSE 133* 147*  BUN 72* 65*  CREATININE 2.33* 1.34*  CALCIUM 8.8* 9.2   No results for input(s): PTH in the last 72 hours. Iron Studies: No results for input(s): IRON, TIBC, TRANSFERRIN, FERRITIN in the last 72 hours. Studies/Results: Dg Chest Port 1 View  08/13/2015   CLINICAL DATA:  Patient with history pleural effusion.  CHF.  EXAM: PORTABLE CHEST - 1 VIEW  COMPARISON:  Chest radiograph 08/07/2015  FINDINGS: Monitoring leads overlie the patient. Stable enlarged cardiac and mediastinal contours. Interval improvement in right-greater-than-left airspace opacities. Probable small bilateral pleural effusions.  IMPRESSION: Interval improvement in bilateral airspace opacities, likely secondary to improving edema or infection. Continued radiographic follow-up is recommended to ensure complete resolution.   Electronically Signed   By: Lovey Newcomer M.D.   On: 08/13/2015 10:01    I have reviewed the patient's  current medications.  Assessment/Plan: 80 yo female with diastolic CHF, morbitz II AV block and junctional brady, here with hypoxic resp failure and AKI.  1. AKI - Likely 2/2 to hemodynamically mediated etiology (BP dropped to 80's) + Ace Inhibitor (last dose 8/17) + CHF. Baseline Crt 0.86. Was initially high likely from cardiorenal, which improved, but then went up from 1.07 >> 2.23 >> 3.85. This around the same time when her BP dropped to as low as 83/30 on 8/18, also had last dose of Lisinopril on 8/17. Crt continues to improve with diuresis 1.34 today. This is not Pulmonary-renal etiology given no protein in urine and improvement with lasix.  2. Hyponatremia - likely hypervolemic hyponatremia as sodium 134>>126 after stopping lasix when her weight 170 >>179. Improving slowly with lasix.  3. Acute hypoxic resp failure - likely combination of volume overload and possible inflammatory lung disease. Feels that SOB is better with steroid (al though only got it for 1 day before improvement). CXR also slightly better with lasix. ESR 92, RA factor 57.8 (could be nonspecific - no RA symptoms).    Plan:  1. Continue further diuresis. Will increase to lasix 72m IV BID today. Home dose was 471monce a day, we suspect she will need a higher dose discharge.  2. Agree with continuation of prednisone.  3. Avoid ACE I and other nephrotoxins.  4. Monitor kidney function.    LOS: 14 days   Ahmed, Tasrif 08/13/2015,12:50 PM  Renal Attending: Hemodynamically mediated AKI improving and hypervolemic  hyponatremia associated with CHF improving.  Will continue current plan as articulated above. Greyden Besecker C

## 2015-08-13 NOTE — Progress Notes (Signed)
PATIENT DETAILS Name: Colleen [name] Age: 80 y.o. Sex: female Date of Birth: July 29, 1935 Admit Date: 07/30/2015 Admitting Physician Theressa Millard, MD DHW:YSHUOH Colleen Sjogren, DO  Brief narrative:   80 year old female with history of diastolic heart failure admitted on 8/7 with worsening shortness of breath. She was initially felt to have acute diastolic heart failure, and was started on IV Lasix. In spite of aggressive diuresis, and with her being below her dry weight-she continued to have shortness of breath, x-ray during this time showed worsening congestive heart failure. She subsequently underwent a high-resolution CT of the chest that showed new minus-like appearing airspace consolidation-highly suggestive of a inflammatory process/autoimmune process. PCCM was consulted, bronchoscopy was attempted-could not be completed because of worsening bradycardia. Serological workup so far is positive for a significantly elevated rheumatoid factor. PCCM is contemplating starting patient on steroids once further serological tests are back. Unfortunately latter half of the hospital course has been complicated by development of acute renal failure, likely hemodynamically mediated due to borderline hypotension. Please see below for further details  Subjective:  Patient in bed, denies any headache chest pain, does have shortness of breath but better than what she was upon admission, no focal weakness.  Assessment/Plan:   1. Acute hypoxic respiratory failure: Due to combination of acute on chronic diastolic CHF along with nonspecific inflammatory change in the lungs   A. Acute on chronic diastolic CHF. Has been adequately diuresed, cardiology on board, echocardiogram shows EF of 65% without any wall motion abnormalities, currently not on diuretic, cardiology on board and they are contemplating right heart cath  [facility] Weights   08/11/15 0526 08/12/15 0500 08/13/15 0629  Weight: 81.33 kg  (179 lb 4.8 oz) 82.3 kg (181 lb 7 oz) 81.5 kg (179 lb 10.8 oz)    B. Nonspecific lung infiltrates on CT angiogram and CT chest high resolution. No PE on CT angiogram chest and on lower extremity duplex, pulmonary on board, she is still short of breath and hypoxic, she is currently on Solu-Medrol since 08/12/2015 with some clinical improvement and chest x-ray improvement on 08/13/2015. I have requested pulmonary to reevaluate. Bronchoscopy could not be done earlier due to severe bradycardia. Continue supportive care with oxygen nebulizer treatment. We'll request pulmonary to evaluate and advise on further treatment plan. Her rheumatoid factor is high, ANA negative, anti-CCP antibody pending, pro-calcitonin unremarkable, ESR 92.      ARF with hyponatremia: No evidence of fluid overload, blood pressure borderline, placed on midodrine, renal consulted. We'll defer further management of ARF and hyponatremia to nephrology. Currently being gently diuresed with Lasix by nephrology.   Sinus bradycardia: Heart rate is stable in the 50s, continue to monitor off beta blockers. History of Mobitz type II AV block as well as junctional bradycardia which is likely not contributing to her SOB per cardiology. Cardiology on board who recommended no further treatment. She used to be on beta blocker but has been off for the last 2 weeks. TSH pending.  Hypertension: BP now soft-diuretics on hold- on midodrine  Hypothyroidism: Continue with Synthroid, check TSH due to mild bradycardia.  Hyperlipidemia: Resume niacin on discharge   Disposition: Remain inpatient  Antimicrobial agents  See below  Anti-infectives    None      DVT Prophylaxis: Prophylactic Lovenox   Code Status: Full code   Family Communication None at betsiytde  Procedures:  TTE  - Left ventricle: The cavity  size was normal. Wall thickness wasnormal. Systolic function was vigorous. The estimated ejectionfraction was in the range of  65% to 70%. Wall motion was normal;there were no regional wall motion abnormalities. - Mitral valve: Mildly calcified annulus. There was no significantregurgitation. - Left atrium: The atrium was moderately to severely dilated. - Right ventricle: The cavity size was mildly dilated. Systolicfunction was normal. - Right atrium: Prominent Eustachian valve. - Atrial septum: No defect or patent foramen ovale was identified.Echo contrast study showed no right-to-left atrial level shunt, at baseline or with provocation. - Tricuspid valve: Peak RV-RA gradient (S): 38 mm Hg. - Pulmonary arteries: PA peak pressure: 46 mm Hg (S). - Systemic veins: IVC measured 2.3 cm with > 50% respirophasicvariation, suggesting RA pressure 8 mmHg.  Impressions:  Limited echo for bubble study (negative).   CONSULTS:  cardiology, pulmonary/intensive care and nephrology  Time spent 30 minutes-Greater than 50% of this time was spent in counseling, explanation of diagnosis, planning of further management, and coordination of care.  MEDICATIONS: Scheduled Meds: . enoxaparin (LOVENOX) injection  30 mg Subcutaneous Q24H  . levothyroxine  100 mcg Oral QAC breakfast  . methylPREDNISolone (SOLU-MEDROL) injection  60 mg Intravenous Daily  . midodrine  5 mg Oral TID WC  . polyethylene glycol  17 g Oral BID  . sodium chloride  3 mL Intravenous Q12H  . tiotropium  18 mcg Inhalation Daily   Continuous Infusions:  PRN Meds:.sodium chloride, acetaminophen, calcium carbonate, fentaNYL, lidocaine, midazolam, ondansetron (ZOFRAN) IV, sodium chloride    PHYSICAL EXAM: Vital signs in last 24 hours: Filed Vitals:   08/12/15 2201 08/13/15 0122 08/13/15 0629 08/13/15 0918  BP: 140/47  136/65 138/46  Pulse: 53 37 49 54  Temp: 98.2 F (36.8 C)  97.6 F (36.4 C) 98.4 F (36.9 C)  TempSrc: Oral  Oral Oral  Resp: 18  18   Height:      Weight:   81.5 kg (179 lb 10.8 oz)   SpO2: 94%  94% 92%    Weight change: -0.8 kg  (-1 lb 12.2 oz) Filed Weights   08/11/15 0526 08/12/15 0500 08/13/15 0629  Weight: 81.33 kg (179 lb 4.8 oz) 82.3 kg (181 lb 7 oz) 81.5 kg (179 lb 10.8 oz)   Body mass index is 29.9 kg/(m^2).   Gen Exam: Awake and alert with clear speech.  Neck: Supple, No JVD.   Chest: B/L Clear except for a few bibasilar rales CVS: S1 S2 Regular, no murmurs.  Abdomen: soft, BS +, non tender, non distended.  Extremities: no edema, lower extremities warm to touch. Neurologic: Non Focal.   Skin: No Rash.   Wounds: N/A.   Intake/Output from previous day:  Intake/Output Summary (Last 24 hours) at 08/13/15 1002 Last data filed at 08/13/15 5631  Gross per 24 hour  Intake    500 ml  Output   1225 ml  Net   -725 ml     LAB RESULTS: CBC  Recent Labs Lab 08/07/15 0315 08/12/15 1211  WBC 7.0 6.9  HGB 11.1* 10.7*  HCT 34.1* 32.4*  PLT 258 238  MCV 102.1* 100.0  MCH 33.2 33.0  MCHC 32.6 33.0  RDW 13.0 13.2    Chemistries   Recent Labs Lab 08/09/15 0351 08/10/15 0510 08/11/15 0526 08/12/15 0305 08/13/15 0435  NA 133* 134* 126* 127* 128*  K 2.9* 3.8 4.7 4.3 4.6  CL 84* 88* 88* 90* 91*  CO2 37* 35* _0 GLUCOSE 119* 94 90  133* 147*  BUN 41* 46* 65* 72* 65*  CREATININE 1.07* 2.23* 3.85* 2.33* 1.34*  CALCIUM 9.2 9.1 8.6* 8.8* 9.2  MG 2.2  --   --   --  2.3    CBG: No results for input(s): GLUCAP in the last 168 hours.  GFR Estimated Creatinine Clearance: 37.1 mL/min (by C-G formula based on Cr of 1.34).  Coagulation profile  Recent Labs Lab 08/12/15 1211  INR 1.20    Cardiac Enzymes No results for input(s): CKMB, TROPONINI, MYOGLOBIN in the last 168 hours.  Invalid input(s): CK  Invalid input(s): POCBNP No results for input(s): DDIMER in the last 72 hours. No results for input(s): HGBA1C in the last 72 hours. No results for input(s): CHOL, HDL, LDLCALC, TRIG, CHOLHDL, LDLDIRECT in the last 72 hours. No results for input(s): TSH, T4TOTAL, T3FREE, THYROIDAB in  the last 72 hours.  Invalid input(s): FREET3 No results for input(s): VITAMINB12, FOLATE, FERRITIN, TIBC, IRON, RETICCTPCT in the last 72 hours. No results for input(s): LIPASE, AMYLASE in the last 72 hours.  Urine Studies No results for input(s): UHGB, CRYS in the last 72 hours.  Invalid input(s): UACOL, UAPR, USPG, UPH, UTP, UGL, UKET, UBIL, UNIT, UROB, ULEU, UEPI, UWBC, URBC, UBAC, CAST, UCOM, BILUA  MICROBIOLOGY: No results found for this or any previous visit (from the past 240 hour(s)).  RADIOLOGY STUDIES/RESULTS: Dg Chest 2 View  08/04/2015   CLINICAL DATA:  Shortness of breath, low oxygen saturation  EXAM: CHEST  2 VIEW  COMPARISON:  07/30/2015  FINDINGS: There is stable cardiomegaly. There are trace bilateral pleural effusions. There is bilateral diffuse interstitial thickening. There is no pneumothorax. There is no acute osseous abnormality.  IMPRESSION: Findings most consistent with mild CHF.   Electronically Signed   By: Kathreen Devoid   On: 08/04/2015 12:36   Dg Chest 2 View  07/22/2015   CLINICAL DATA:  Shortness of breath, dry cough.  EXAM: CHEST  2 VIEW  COMPARISON:  02/20/2015  FINDINGS: There is mild cardiomegaly and vascular congestion. No overt edema. No confluent opacities or effusions. No acute bony abnormality.  IMPRESSION: Cardiomegaly, vascular congestion.   Electronically Signed   By: Rolm Baptise M.D.   On: 07/22/2015 18:48   Ct Angio Chest Pe W/cm &/or Wo Cm  07/28/2015   CLINICAL DATA:  Shortness breath for 2 weeks. Personal history of smoking. The patient quit smoking 15 years ago.  EXAM: CT ANGIOGRAPHY CHEST WITH CONTRAST  TECHNIQUE: Multidetector CT imaging of the chest was performed using the standard protocol during bolus administration of intravenous contrast. Multiplanar CT image reconstructions and MIPs were obtained to evaluate the vascular anatomy.  CONTRAST:  156m OMNIPAQUE IOHEXOL 350 MG/ML SOLN  COMPARISON:  Two-view chest x-ray 07/22/2015.  FINDINGS:  Pulmonary arterial opacification is satisfactory. The study is mildly degraded by patient breathing motion. No focal filling defects are evident to suggest pulmonary emboli.  The heart is mildly enlarged. Coronary artery calcifications are present. There is no significant pericardial effusion.  A precarinal lymph node measures 1.2 cm in short axis. Subcentimeter prevascular lymph nodes are present. No other pathologically enlarged nodes are present. No significant axillary nodes are present.  Small moderate bilateral pleural effusions are present. There is some associated atelectasis.  Diffuse ground-glass attenuation is present throughout both lungs, likely representing edema. No significant airspace consolidation is present.  The bone windows demonstrate exaggerated kyphosis in the lower thoracic spine. No focal lytic or blastic lesions are present.  Review of  the MIP images confirms the above findings.  IMPRESSION: 1. No evidence for pulmonary embolus. 2. Cardiomegaly, interstitial edema, and bilateral pleural effusions are compatible with congestive heart failure. 3. No focal airspace opacification other than dependent atelectasis. 4. Enlarged precarinal lymph node. Other smaller mediastinal nodes are present as well. These are likely reactive in the setting of congestive heart failure. These results will be called to the ordering clinician or representative by the Radiologist Assistant, and communication documented in the PACS or zVision Dashboard.   Electronically Signed   By: San Morelle M.D.   On: 07/28/2015 11:15   Ct Chest High Resolution  08/07/2015   CLINICAL DATA:  80 year old female with history of diastolic congestive heart failure. Dyspnea on exertion. Weight gain. Orthopnea new increasing edema. History of asbestos exposure.  EXAM: CT CHEST WITHOUT CONTRAST  TECHNIQUE: Multidetector CT imaging of the chest was performed following the standard protocol without intravenous contrast. High  resolution imaging of the lungs, as well as inspiratory and expiratory imaging, was performed.  COMPARISON:  Chest CT 07/28/2015.  FINDINGS: Mediastinum/Lymph Nodes: Heart size is mildly enlarged. There is no significant pericardial fluid, thickening or pericardial calcification. There is atherosclerosis of the thoracic aorta, the great vessels of the mediastinum and the coronary arteries, including calcified atherosclerotic plaque in the left anterior descending and right coronary arteries. Calcifications of the mitral annulus. Calcifications of the aortic valve. Multiple prominent borderline enlarged and mildly enlarged mediastinal lymph nodes, measuring up to 12 mm in short axis in the low right paratracheal station (unchanged). Esophagus is unremarkable in appearance. No axillary lymphadenopathy.  Lungs/Pleura: Compared to the prior examination from 07/28/2015 there are multiple nodular and mass-like areas of peribronchovascular airspace consolidation now noted in the lower lobes of the lungs bilaterally, presumably infectious or inflammatory in etiology given their rapid development. There are additional patchy areas of ground-glass attenuation scattered throughout the lungs bilaterally, most confluent in the apex of the right upper lobe. Background of mild interlobular septal thickening, suggestive of a background of mild interstitial pulmonary edema. Previously noted small bilateral pleural effusions have nearly completely resolved (trace bilateral pleural effusions are noted on today's examination). Evaluation of high-resolution images is limited given the acute findings on today's examination. There are some areas of mild subpleural reticulation, however, this could simply be related to underlying edema. No calcified pleural plaques. Inspiratory and expiratory imaging demonstrates extensive air trapping, indicative of small airways disease.  Upper Abdomen: Status post cholecystectomy. Slightly nodular  contour of the liver, could indicate early changes of cirrhosis. Atherosclerosis.  Musculoskeletal/Soft Tissues: There are no aggressive appearing lytic or blastic lesions noted in the visualized portions of the skeleton.  IMPRESSION: 1. Assessment for underlying interstitial lung disease is significantly limited on today's examination secondary to multiple acute findings, as discussed above. No overt changes suggestive of interstitial lung disease are confidently identified. If there continues to be clinical concern for interstitial lung disease, follow-up outpatient evaluation with high-resolution chest CT in 6-12 months would be recommended. 2. Interval development of multifocal peribronchovascular nodular and mass-like appearing airspace consolidation most evident in the lower lobes of the lungs bilaterally (left greater than right), presumably infectious or inflammatory in etiology. 3. Extensive air trapping, indicative of small airways disease. 4. No calcified pleural plaques to suggest asbestos related pleural disease. 5. Mild diffuse interlobular septal thickening, likely to reflect a background of mild interstitial pulmonary edema. Small bilateral pleural effusions have nearly completely resolved compared to the prior examination. 6. Mild cardiomegaly.  7. Atherosclerosis, including 2 vessel coronary artery disease. Assessment for potential risk factor modification, dietary therapy or pharmacologic therapy may be warranted, if clinically indicated. 8. Additional incidental findings, as above.   Electronically Signed   By: Vinnie Langton M.D.   On: 08/07/2015 12:29   Dg Chest Port 1 View  08/07/2015   CLINICAL DATA:  Shortness of Breath  EXAM: PORTABLE CHEST - 1 VIEW  COMPARISON:  08/04/2015  FINDINGS: Cardiac shadow remains enlarged. Prominent vascular congestion is again right greater than left with interstitial changes changes have progressed slightly in the interval from the prior exam although some  of this may be technically related. Aortic calcifications are again seen. The bony structures are stable.  IMPRESSION: Changes consistent with CHF which have worsened slightly in the interval from the prior exam.   Electronically Signed   By: Inez Catalina M.D.   On: 08/07/2015 07:39   Dg Chest Port 1 View  07/30/2015   CLINICAL DATA:  Patient with shortness of breath since last Saturday. Recent diagnosis congestive heart failure.  EXAM: PORTABLE CHEST - 1 VIEW  COMPARISON:  Chest CT 07/28/2015; chest radiograph 07/22/2015  FINDINGS: Monitoring leads overlie the patient. Cardiomegaly. Moderate left and small right pleural effusions. Bilateral perihilar interstitial pulmonary opacities. No pneumothorax.  IMPRESSION: Cardiomegaly with increasing interstitial opacities concerning for pulmonary edema. Moderate left and small right pleural effusions.   Electronically Signed   By: Lovey Newcomer M.D.   On: 07/30/2015 21:18    Thurnell Lose, MD  [facility] Pager:[phone]  If 7PM-7AM, please contact night-coverage [email or web address] Password [email or web address] 08/13/2015, 10:02 AM   LOS: 14 days

## 2015-08-13 NOTE — Progress Notes (Addendum)
Charting error note for 3e06

## 2015-08-13 NOTE — Progress Notes (Signed)
Patient ID: Colleen [name], female   DOB: 29-May-1935, 80 y.o.   MRN: [id number] Patient had bad experience with previous heart cath from femoral approach. Painful and large hematoma.  Discussed the fact that she is having just a right Heart cath.  She is addiment about alternative approach  Indicated it can be done from antecubital vein or IJ She is willing to try this and will sign consent  Wrote order for iv team to start right medial antecubital iv  Jenkins Rouge

## 2015-08-13 NOTE — Progress Notes (Signed)
PULMONARY / CRITICAL CARE MEDICINE   Name: Colleen [name] MRN: [id number] DOB: 11/23/1935   PCP Garnet Koyanagi, DO   ADMISSION DATE:  07/30/2015 CONSULTATION DATE:  08/07/15 LOS 16 das   REFERRING MD :  Dr Lovena Neighbours  CHIEF COMPLAINT:  Dyspnea, hypoxemia  SUBJECTIVE: Breathing better, diuresing  ROS:  VITAL SIGNS: Temp:  [97.6 F (36.4 C)-99.1 F (37.3 C)] 98.4 F (36.9 C) (08/21 0918) Pulse Rate:  [37-54] 54 (08/21 0918) Resp:  [18-20] 18 (08/21 0629) BP: (129-140)/(46-65) 138/46 mmHg (08/21 0918) SpO2:  [92 %-95 %] 92 % (08/21 0918) FiO2 (%):  [93 %] 93 % (08/21 0741) Weight:  [81.5 kg (179 lb 10.8 oz)] 81.5 kg (179 lb 10.8 oz) (08/21 0629) HEMODYNAMICS:   VENTILATOR SETTINGS: Vent Mode:  [-]  FiO2 (%):  [93 %] 93 % INTAKE / OUTPUT:  Intake/Output Summary (Last 24 hours) at 08/13/15 1139 Last data filed at 08/13/15 3016  Gross per 24 hour  Intake    400 ml  Output   1025 ml  Net   -625 ml    PHYSICAL EXAMINATION: General:  Awake. Alert. Up in chair, relaxed and conversational  Integument:  Warm & dry. No rash. HEENT:  Moist mucus membranes. No oral ulcers. No scleral icterus. Cardiovascular:  Regular rate. No edema. No JVD Pulmonary:  Clear bilaterally to auscultation. Normal WOB on [place] oxygen. Abdomen: Soft. Normal bowel sounds. Mildly protuberant. Grossly nontender.  LABS:  CBC  Recent Labs Lab 08/07/15 0315 08/12/15 1211  WBC 7.0 6.9  HGB 11.1* 10.7*  HCT 34.1* 32.4*  PLT 258 238   Coag's  Recent Labs Lab 08/12/15 1211  INR 1.20   BMET  Recent Labs Lab 08/11/15 0526 08/12/15 0305 08/13/15 0435  NA 126* 127* 128*  K 4.7 4.3 4.6  CL 88* 90* 91*  CO2 25 28 27   BUN 65* 72* 65*  CREATININE 3.85* 2.33* 1.34*  GLUCOSE 90 133* 147*   Electrolytes  Recent Labs Lab 08/09/15 0351  08/11/15 0526 08/12/15 0305 08/13/15 0435  CALCIUM 9.2  < > 8.6* 8.8* 9.2  MG 2.2  --   --   --  2.3  PHOS  --   --   --  5.6*  --   < > = values in this  interval not displayed. Sepsis Markers  Recent Labs Lab 08/07/15 1222 08/09/15 0351 08/11/15 0526  PROCALCITON <0.10 <0.10 0.24   ABG No results for input(s): PHART, PCO2ART, PO2ART in the last 168 hours. Liver Enzymes  Recent Labs Lab 08/12/15 0305  ALBUMIN 3.2*   Cardiac Enzymes No results for input(s): TROPONINI, PROBNP in the last 168 hours. Glucose No results for input(s): GLUCAP in the last 168 hours.  Imaging Dg Chest Port 1 View  08/13/2015   CLINICAL DATA:  Patient with history pleural effusion.  CHF.  EXAM: PORTABLE CHEST - 1 VIEW  COMPARISON:  Chest radiograph 08/07/2015  FINDINGS: Monitoring leads overlie the patient. Stable enlarged cardiac and mediastinal contours. Interval improvement in right-greater-than-left airspace opacities. Probable small bilateral pleural effusions.  IMPRESSION: Interval improvement in bilateral airspace opacities, likely secondary to improving edema or infection. Continued radiographic follow-up is recommended to ensure complete resolution.   Electronically Signed   By: Lovey Newcomer M.D.   On: 08/13/2015 10:01     ASSESSMENT / PLAN: 1. Bilateral pneumonia: Much improved since steroids started. She is also beginning to diurese. Would continue solumedrol 60 mg/ daily today. CXR ordered for AM. 2. Possible  Rheumatoid Arthritis:  Continues to have no joint complaints. Awaiting Anti-CCP. 3. Acute hypoxic respiratory failure: Improving. Continuing to wean FiO2 for Sat >92%. 4. Acute Renal Failure:  Likely due to diuresis. Per primary service. U/A pending. May now be entering diuretic phase of ATN 5. Hypokalemia: Resolved. 6. Followup:  Patient scheduled for followup with Dr Ashok Cordia / [facility] on 8/26 at 11:30am.    CD Annamaria Boots, MD, . [facility] [phone] After 3pm or if no response, call [phone] 08/13/2015, 11:39 AM

## 2015-08-13 NOTE — Plan of Care (Signed)
Problem: Phase I Progression Outcomes Goal: EF % per last Echo/documented,Core Reminder form on chart Outcome: Completed/Met Date Met:  08/13/15 65-70%(08-08-15)

## 2015-08-13 NOTE — Progress Notes (Signed)
Pt HR dropped to 37 then back to 45-48, no s/s, pt was sleeping, MD notified will continue to monitor, thanks Arvella Nigh RN

## 2015-08-14 ENCOUNTER — Encounter (HOSPITAL_COMMUNITY): Payer: Self-pay | Admitting: Cardiology

## 2015-08-14 ENCOUNTER — Encounter (HOSPITAL_COMMUNITY)
Admission: EM | Disposition: A | Discharge status: Home / Self Care | Payer: Medicare Other | Source: Home / Self Care | Attending: Internal Medicine

## 2015-08-14 ENCOUNTER — Inpatient Hospital Stay (HOSPITAL_COMMUNITY): Payer: Medicare Other

## 2015-08-14 DIAGNOSIS — I495 Sick sinus syndrome: Secondary | ICD-10-CM

## 2015-08-14 DIAGNOSIS — R0600 Dyspnea, unspecified: Secondary | ICD-10-CM

## 2015-08-14 DIAGNOSIS — E876 Hypokalemia: Secondary | ICD-10-CM

## 2015-08-14 HISTORY — PX: CARDIAC CATHETERIZATION: SHX172

## 2015-08-14 LAB — POCT I-STAT 3, VENOUS BLOOD GAS (G3P V)
ACID-BASE EXCESS: 4 mmol/L — AB (ref 0.0–2.0)
Acid-Base Excess: 4 mmol/L — ABNORMAL HIGH (ref 0.0–2.0)
Bicarbonate: 29.8 mEq/L — ABNORMAL HIGH (ref 20.0–24.0)
Bicarbonate: 29.8 mEq/L — ABNORMAL HIGH (ref 20.0–24.0)
O2 SAT: 64 %
O2 Saturation: 66 %
PCO2 VEN: 47.3 mmHg (ref 45.0–50.0)
PH VEN: 7.407 — AB (ref 7.250–7.300)
PO2 VEN: 34 mmHg (ref 30.0–45.0)
TCO2: 31 mmol/L (ref 0–100)
TCO2: 31 mmol/L (ref 0–100)
pCO2, Ven: 48.6 mmHg (ref 45.0–50.0)
pH, Ven: 7.396 — ABNORMAL HIGH (ref 7.250–7.300)
pO2, Ven: 34 mmHg (ref 30.0–45.0)

## 2015-08-14 LAB — BASIC METABOLIC PANEL
Anion gap: 8 (ref 5–15)
BUN: 60 mg/dL — AB (ref 6–20)
CHLORIDE: 91 mmol/L — AB (ref 101–111)
CO2: 32 mmol/L (ref 22–32)
CREATININE: 1.18 mg/dL — AB (ref 0.44–1.00)
Calcium: 9.1 mg/dL (ref 8.9–10.3)
GFR calc Af Amer: 50 mL/min — ABNORMAL LOW (ref >60)
GFR calc non Af Amer: 43 mL/min — ABNORMAL LOW (ref >60)
GLUCOSE: 127 mg/dL — AB (ref 65–99)
Potassium: 3.8 mmol/L (ref 3.5–5.1)
SODIUM: 131 mmol/L — AB (ref 135–145)

## 2015-08-14 LAB — CBC WITH DIFFERENTIAL/PLATELET
BASOS ABS: 0 10*3/uL (ref 0.0–0.1)
Basophils Relative: 0 % (ref 0–1)
EOS PCT: 0 % (ref 0–5)
Eosinophils Absolute: 0 10*3/uL (ref 0.0–0.7)
HCT: 30.8 % — ABNORMAL LOW (ref 36.0–46.0)
Hemoglobin: 10 g/dL — ABNORMAL LOW (ref 12.0–15.0)
LYMPHS PCT: 17 % (ref 12–46)
Lymphs Abs: 0.8 10*3/uL (ref 0.7–4.0)
MCH: 32.6 pg (ref 26.0–34.0)
MCHC: 32.5 g/dL (ref 30.0–36.0)
MCV: 100.3 fL — AB (ref 78.0–100.0)
MONO ABS: 0.5 10*3/uL (ref 0.1–1.0)
MONOS PCT: 9 % (ref 3–12)
Neutro Abs: 3.5 10*3/uL (ref 1.7–7.7)
Neutrophils Relative %: 74 % (ref 43–77)
PLATELETS: 185 10*3/uL (ref 150–400)
RBC: 3.07 MIL/uL — ABNORMAL LOW (ref 3.87–5.11)
RDW: 13.2 % (ref 11.5–15.5)
WBC: 4.8 10*3/uL (ref 4.0–10.5)

## 2015-08-14 LAB — MAGNESIUM: MAGNESIUM: 2.1 mg/dL (ref 1.7–2.4)

## 2015-08-14 SURGERY — RIGHT HEART CATH
Anesthesia: LOCAL

## 2015-08-14 MED ORDER — FENTANYL CITRATE (PF) 100 MCG/2ML IJ SOLN
INTRAMUSCULAR | Status: AC
  Filled 2015-08-14: qty 4

## 2015-08-14 MED ORDER — MIDAZOLAM HCL 2 MG/2ML IJ SOLN
INTRAMUSCULAR | Status: DC | PRN
Start: 2015-08-14 — End: 2015-08-14
  Administered 2015-08-14: 1 mg via INTRAVENOUS

## 2015-08-14 MED ORDER — FUROSEMIDE 10 MG/ML IJ SOLN
80.0000 mg | Freq: Three times a day (TID) | INTRAMUSCULAR | Status: DC
  Administered 2015-08-14 – 2015-08-16 (×6): 80 mg via INTRAVENOUS
  Filled 2015-08-14 (×9): qty 8

## 2015-08-14 MED ORDER — LIDOCAINE HCL (PF) 1 % IJ SOLN
INTRAMUSCULAR | Status: AC
  Filled 2015-08-14: qty 30

## 2015-08-14 MED ORDER — POTASSIUM CHLORIDE CRYS ER 20 MEQ PO TBCR
40.0000 meq | EXTENDED_RELEASE_TABLET | Freq: Once | ORAL | Status: AC
  Administered 2015-08-14: 40 meq via ORAL
  Filled 2015-08-14: qty 2

## 2015-08-14 MED ORDER — HEPARIN (PORCINE) IN NACL 2-0.9 UNIT/ML-% IJ SOLN
INTRAMUSCULAR | Status: AC
  Filled 2015-08-14: qty 500

## 2015-08-14 MED ORDER — ENOXAPARIN SODIUM 40 MG/0.4ML ~~LOC~~ SOLN
40.0000 mg | SUBCUTANEOUS | Status: DC
  Administered 2015-08-15 – 2015-08-16 (×2): 40 mg via SUBCUTANEOUS
  Filled 2015-08-14 (×2): qty 0.4

## 2015-08-14 MED ORDER — MIDAZOLAM HCL 2 MG/2ML IJ SOLN
INTRAMUSCULAR | Status: AC
  Filled 2015-08-14: qty 4

## 2015-08-14 MED ORDER — DIPHENHYDRAMINE HCL 25 MG PO CAPS
25.0000 mg | ORAL_CAPSULE | Freq: Once | ORAL | Status: AC
  Administered 2015-08-14: 25 mg via ORAL
  Filled 2015-08-14: qty 1

## 2015-08-14 MED ORDER — FENTANYL CITRATE (PF) 100 MCG/2ML IJ SOLN
INTRAMUSCULAR | Status: DC | PRN
  Administered 2015-08-14: 25 ug via INTRAVENOUS

## 2015-08-14 SURGICAL SUPPLY — 5 items
CATH BALLN WEDGE 5F 110CM (CATHETERS) ×2 IMPLANT
KIT HEART LEFT (KITS) ×2 IMPLANT
PACK CARDIAC CATHETERIZATION (CUSTOM PROCEDURE TRAY) ×2 IMPLANT
SHEATH FAST CATH BRACH 5F 5CM (SHEATH) ×2 IMPLANT
TRANSDUCER W/STOPCOCK (MISCELLANEOUS) ×3 IMPLANT

## 2015-08-14 NOTE — Progress Notes (Signed)
1840 refused ambulation to cheched on O2 while walking as ordered

## 2015-08-14 NOTE — Progress Notes (Signed)
Subjective: Interval History: breathing continues to be better. Had some insomnia last night. Otherwise continues to feel better. Feels overall "lighter". Had one episode of blood per rectum.   Objective: Vital signs in last 24 hours: Temp:  [97.7 F (36.5 C)-98.4 F (36.9 C)] 97.7 F (36.5 C) (08/22 0551) Pulse Rate:  [50-59] 50 (08/22 0551) Resp:  [18-20] 18 (08/22 0551) BP: (131-150)/(46-56) 131/52 mmHg (08/22 0551) SpO2:  [92 %-96 %] 96 % (08/22 0820) Weight:  [179 lb 7.3 oz (81.4 kg)] 179 lb 7.3 oz (81.4 kg) (08/22 0551) Weight change: -3.5 oz (-0.1 kg)  Intake/Output from previous day: 08/21 0701 - 08/22 0700 In: 820 [P.O.:820] Out: 2350 [Urine:2350] Intake/Output this shift: Total I/O In: -  Out: 450 [Urine:450]  General: sitting up, pleasant HEENT: [place]/at, eomi, perrla Lungs: ctab. Heart: rrr, no m/r/g. No JVD. Abd: soft, nt, nd Ext: no edema.   Lab Results:  Recent Labs  08/12/15 1211 08/14/15 0338  WBC 6.9 4.8  HGB 10.7* 10.0*  HCT 32.4* 30.8*  PLT 238 185   BMET:   Recent Labs  08/13/15 0435 08/14/15 0338  NA 128* 131*  K 4.6 3.8  CL 91* 91*  CO2 27 32  GLUCOSE 147* 127*  BUN 65* 60*  CREATININE 1.34* 1.18*  CALCIUM 9.2 9.1   No results for input(s): PTH in the last 72 hours. Iron Studies: No results for input(s): IRON, TIBC, TRANSFERRIN, FERRITIN in the last 72 hours. Studies/Results: Dg Chest Port 1 View  08/14/2015   CLINICAL DATA:  Pneumonia .  EXAM: PORTABLE CHEST - 1 VIEW  COMPARISON:  08/13/2015.  FINDINGS: Mediastinum and hilar structures are normal. Stable cardiomegaly with pulmonary vascular prominence and bilateral interstitial prominence with small left pleural effusion. Findings consistent with congestive heart failure. Slight worsening from prior exam. No pneumothorax. Surgical clips left neck.  IMPRESSION: Cardiomegaly with pulmonary vascular prominence and bilateral interstitial prominence with small left pleural effusion.  Findings consistent with congestive heart failure. Slight worsening from prior exam.   Electronically Signed   By: Marcello Moores  Register   On: 08/14/2015 07:29   Dg Chest Port 1 View  08/13/2015   CLINICAL DATA:  Patient with history pleural effusion.  CHF.  EXAM: PORTABLE CHEST - 1 VIEW  COMPARISON:  Chest radiograph 08/07/2015  FINDINGS: Monitoring leads overlie the patient. Stable enlarged cardiac and mediastinal contours. Interval improvement in right-greater-than-left airspace opacities. Probable small bilateral pleural effusions.  IMPRESSION: Interval improvement in bilateral airspace opacities, likely secondary to improving edema or infection. Continued radiographic follow-up is recommended to ensure complete resolution.   Electronically Signed   By: Lovey Newcomer M.D.   On: 08/13/2015 10:01    I have reviewed the patient's current medications.  Assessment/Plan: 80 yo female with diastolic CHF, morbitz II AV block and junctional brady, here with hypoxic resp failure and AKI.  1. AKI - Likely 2/2 to hemodynamically mediated etiology (BP dropped to 80's) + Ace Inhibitor (last dose 8/17) + CHF. Baseline Crt 0.86. Was initially high likely from cardiorenal, which improved, but then went up from 1.07 >> 2.23 >> 3.85. This around the same time when her BP dropped to as low as 83/30 on 8/18, also had last dose of Lisinopril on 8/17. Crt continues to improve with diuresis 1.18 today. This is not Pulmonary-renal etiology given no protein in urine and improvement with lasix.  2. Hyponatremia - likely hypervolemic hyponatremia as sodium 134>>126 after stopping lasix when her weight 170 >>179. Improving slowly  with lasix.   3. Acute hypoxic resp failure - improving - likely combination of volume overload and possible inflammatory lung disease. Feels that SOB is better with steroid (al though only got it for 1 day before improvement). CXR also slightly better with lasix. ESR 92, RA factor 57.8 (could be  nonspecific - no RA symptoms). Cards doing RHC today. 4. BRBPR - only 1x per patient, FOBT pending.   Plan:  1. Continue further diuresis with lasix 55m IV BID today. Home dose was 440monce a day, we suspect she will need a higher dose discharge.  2. Agree with continuation of prednisone.  3. Avoid ACE I and other nephrotoxins.  4. Monitor kidney function.    LOS: 15 days   Ahmed, Tasrif 08/14/2015,8:40 AM I have seen and examined this patient and agree with plan as outlined by Dr. AhGenene Churn Continue with IV Diuresis and await right heart cath results.  Continue to follow sodium and Scr. Fern Canova A,MD 08/14/2015 11:03 AM

## 2015-08-14 NOTE — Progress Notes (Signed)
PCCM PROGRESS NOTE  ADMISSION DATE: 07/30/2015 CONSULT DATE: 08/07/2015 REFERRING PROVIDER: TRIAD  CC: Short of breath  SUBJECTIVE: Breathing continues to improve.  Still on supplemental oxygen.  Denies cough, wheeze, chest pain.  Had cardiac cath this AM.  OBJECTIVE: Temp:  [97.7 F (36.5 C)-98 F (36.7 C)] 97.7 F (36.5 C) (08/22 0551) Pulse Rate:  [50-59] 50 (08/22 0551) Resp:  [18-20] 18 (08/22 0551) BP: (131-150)/(48-56) 131/52 mmHg (08/22 0551) SpO2:  [93 %-96 %] 96 % (08/22 0820) Weight:  [179 lb 7.3 oz (81.4 kg)] 179 lb 7.3 oz (81.4 kg) (08/22 0551) General: no distress HEENT: no sinus tenderness Cardiac: regular Chest: scattered rhonchi, no wheeze Abd: soft, non tender Ext: no edema Neuro: normal strength Skin: no rashes   CMP Latest Ref Rng 08/14/2015 08/13/2015 08/12/2015  Glucose 65 - 99 mg/dL 127(H) 147(H) 133(H)  BUN 6 - 20 mg/dL 60(H) 65(H) 72(H)  Creatinine 0.44 - 1.00 mg/dL 1.18(H) 1.34(H) 2.33(H)  Sodium 135 - 145 mmol/L 131(L) 128(L) 127(L)  Potassium 3.5 - 5.1 mmol/L 3.8 4.6 4.3  Chloride 101 - 111 mmol/L 91(L) 91(L) 90(L)  CO2 22 - 32 mmol/L 32 27 28  Calcium 8.9 - 10.3 mg/dL 9.1 9.2 8.8(L)  Total Protein 6.0-8.3 g/dL - - -  Total Bilirubin 0.3-1.2 mg/dL - - -  Alkaline Phos 39-117 units/L - - -  AST 0-37 units/L - - -  ALT 0-35 units/L - - -    CBC Latest Ref Rng 08/14/2015 08/12/2015 08/07/2015  WBC 4.0 - 10.5 K/uL 4.8 6.9 7.0  Hemoglobin 12.0 - 15.0 g/dL 10.0(L) 10.7(L) 11.1(L)  Hematocrit 36.0 - 46.0 % 30.8(L) 32.4(L) 34.1(L)  Platelets 150 - 400 K/uL 185 238 258    BNP (last 3 results)  Recent Labs  07/22/15 1715 07/30/15 2050 08/06/15 1703  BNP 394.9* 346.3* 189.9*    Lab Results  Component Value Date   ESRSEDRATE 92* 08/07/2015    Dg Chest Port 1 View  08/14/2015   CLINICAL DATA:  Pneumonia .  EXAM: PORTABLE CHEST - 1 VIEW  COMPARISON:  08/13/2015.  FINDINGS: Mediastinum and hilar structures are normal. Stable cardiomegaly with  pulmonary vascular prominence and bilateral interstitial prominence with small left pleural effusion. Findings consistent with congestive heart failure. Slight worsening from prior exam. No pneumothorax. Surgical clips left neck.  IMPRESSION: Cardiomegaly with pulmonary vascular prominence and bilateral interstitial prominence with small left pleural effusion. Findings consistent with congestive heart failure. Slight worsening from prior exam.   Electronically Signed   By: Marcello Moores  Register   On: 08/14/2015 07:29   Dg Chest Port 1 View  08/13/2015   CLINICAL DATA:  Patient with history pleural effusion.  CHF.  EXAM: PORTABLE CHEST - 1 VIEW  COMPARISON:  Chest radiograph 08/07/2015  FINDINGS: Monitoring leads overlie the patient. Stable enlarged cardiac and mediastinal contours. Interval improvement in right-greater-than-left airspace opacities. Probable small bilateral pleural effusions.  IMPRESSION: Interval improvement in bilateral airspace opacities, likely secondary to improving edema or infection. Continued radiographic follow-up is recommended to ensure complete resolution.   Electronically Signed   By: Lovey Newcomer M.D.   On: 08/13/2015 10:01   STUDIES: 8/14 Doppler legs b/l >> no DVT 8/15 Spirometry >> FEV1 1.18 (56%), FEV1% 69 8/15 HR CT chest >> motion artifact, multifocal nodular/mass like ASD Lt > Rt, air trapping, interlobular septal thickening, small b/l effusions 8/15 Labs >> RF 57.8, ANA negative 8/16 Echo bubble study >> EF 65 to 70%, mod/severe LA dilation, PAS 46  mmHg, no shunt 8/17 Bronchoscopy 8/22 Rt heart cardiac cath >> RA 20, RV 72/20, PA 72/27/39, PCWP 29 with prominent V wave, CI 3.06, PVR 1.7  EVENTS: 8/07 Admit 8/08 Cardiology consulted 8/15 Add spiriva 8/19 Renal consulted 8/20 Add solumedrol  DISCUSSION: 80 yo female former smoker presented with dyspnea, edema with initial concern for CHF exacerbation.  She only had partial improvement with diuresis.  CT chest  showed b/l nodular/mass like ASD, elevated ESR, mildly elevated RF, and she did have clinical improvement after starting steroids.  Rt heart cath shows volume overload also.  ASSESSMENT/PLAN:  Acute hypoxic respiratory failure with b/l nodular pulmonary infiltrates with increased ESR >> improved with addition of steroids. Plan: - continue solumedrol today >> plan to transition to prednisone 8/23 - she will need to be assessed for home oxygen prior to discharge  COPD. Plan: - continue spiriva  Acute on chronic diastolic CHF. Hx of HTN, CAD, Mobitz type II, HLD. Plan: - continue diuresis per primary team and cardiology  AKI, hyponatremia. Hx of hypothyroidism. Plan: - per primary team   She has f/u with Dr. Ashok Cordia in pulmonary office on 08/18/15.  Updated pt's husband at bedside.  Chesley Mires, MD [facility] Pulmonary/Critical Care 08/14/2015, 10:57 AM Pager:  [phone] After 3pm call: [phone]

## 2015-08-14 NOTE — Telephone Encounter (Signed)
charge 

## 2015-08-14 NOTE — Interval H&P Note (Signed)
History and Physical Interval Note:  08/14/2015 9:55 AM  Colleen [name]  has presented today for surgery, with the diagnosis of heart failure  The various methods of treatment have been discussed with the patient and family. After consideration of risks, benefits and other options for treatment, the patient has consented to  Procedure(s): Right Heart Cath (N/A) as a surgical intervention .  The patient's history has been reviewed, patient examined, no change in status, stable for surgery.  I have reviewed the patient's chart and labs.  Questions were answered to the patient's satisfaction.     Colleen [name] [employer]

## 2015-08-14 NOTE — Telephone Encounter (Signed)
Called spoke with pt spouse. He reports pt is still currently hospitalized and has been told not sure when pt will be d/c'd. Pt spouse did not want to make an appt this week yet. He will call once they know for sure when pt will be d/c'd. FYI for Dr. Ashok Cordia.

## 2015-08-14 NOTE — Telephone Encounter (Signed)
Lmtcb. Patient needs to be scheduled to see Dr. Ashok Cordia on 08/18/2015 at 11:30am... Ok to [employer] per Dr. Ashok Cordia.

## 2015-08-14 NOTE — Telephone Encounter (Signed)
Pt was no show 08/11/15 1:30pm, hospital f/u appt, left msg for pt to call and reschedule, charge for no show?

## 2015-08-14 NOTE — H&P (View-Only) (Signed)
Patient ID: KAILENE STEINHART, female   DOB: October 27, 1935, 80 y.o.   MRN: [id number]   Patient Name: Colleen [name] Date of Encounter: 08/12/2015     Principal Problem:   Acute diastolic CHF (congestive heart failure) Active Problems:   Hypothyroidism   Hyperlipidemia   Essential hypertension   Acute respiratory failure with hypoxia   AKI (acute kidney injury)   Sinus bradycardia   Hyponatremia   Acute kidney injury (nontraumatic)   Acute on chronic respiratory failure   Pulmonary infiltrate    SUBJECTIVE  Frustrated at lack of diagnosis.  Breathing ok urinating better   CURRENT MEDS . enoxaparin (LOVENOX) injection  30 mg Subcutaneous Q24H  . levothyroxine  100 mcg Oral QAC breakfast  . midodrine  5 mg Oral TID WC  . polyethylene glycol  17 g Oral BID  . sodium chloride  3 mL Intravenous Q12H  . tiotropium  18 mcg Inhalation Daily    OBJECTIVE  Filed Vitals:   08/11/15 1831 08/11/15 2121 08/12/15 0500 08/12/15 0809  BP: 124/38 123/40 110/45   Pulse: 51 53 50   Temp:  98.7 F (37.1 C) 98.2 F (36.8 C)   TempSrc:  Oral Oral   Resp:  18 18   Height:      Weight:   82.3 kg (181 lb 7 oz)   SpO2:  95% 95% 93%    Intake/Output Summary (Last 24 hours) at 08/12/15 1034 Last data filed at 08/12/15 1025  Gross per 24 hour  Intake    960 ml  Output    770 ml  Net    190 ml   Filed Weights   08/10/15 0458 08/11/15 0526 08/12/15 0500  Weight: 79.198 kg (174 lb 9.6 oz) 81.33 kg (179 lb 4.8 oz) 82.3 kg (181 lb 7 oz)    PHYSICAL EXAM   PE: General:Pleasant affect, NAD Skin:Warm and dry, brisk capillary refill HEENT:normocephalic, sclera clear, mucus membranes moist Neck:supple, no JVD Heart:S1S2 RRR SEM murmur, gallup, rub or click Lungs: few rales, rhonchi, or wheezes OTL:XBWI, non tender, + BS, do not palpate liver spleen or masses Ext:no lower ext edema, 2+ pedal pulses, 2+ radial pulses Neuro:alert and oriented X 3, MAE, follows commands, + facial  symmetry  Accessory Clinical Findings  CBC Basic Metabolic Panel  Recent Labs  08/11/15 0526 08/12/15 0305  NA 126* 127*  K 4.7 4.3  CL 88* 90*  CO2 25 28  GLUCOSE 90 133*  BUN 65* 72*  CREATININE 3.85* 2.33*  CALCIUM 8.6* 8.8*  PHOS  --  5.6*    TELE  Sinus brady with HR in high 40s and 50s.   Radiology/Studies  Dg Chest 2 View  08/04/2015   CLINICAL DATA:  Shortness of breath, low oxygen saturation  EXAM: CHEST  2 VIEW  COMPARISON:  07/30/2015  FINDINGS: There is stable cardiomegaly. There are trace bilateral pleural effusions. There is bilateral diffuse interstitial thickening. There is no pneumothorax. There is no acute osseous abnormality.  IMPRESSION: Findings most consistent with mild CHF.   Electronically Signed   By: Kathreen Devoid   On: 08/04/2015 12:36   Dg Chest 2 View  07/22/2015   CLINICAL DATA:  Shortness of breath, dry cough.  EXAM: CHEST  2 VIEW  COMPARISON:  02/20/2015  FINDINGS: There is mild cardiomegaly and vascular congestion. No overt edema. No confluent opacities or effusions. No acute bony abnormality.  IMPRESSION: Cardiomegaly, vascular congestion.   Electronically Signed   By: Lennette Bihari  [place] M.D.   On: 07/22/2015 18:48   Ct Angio Chest Pe W/cm &/or Wo Cm  07/28/2015   CLINICAL DATA:  Shortness breath for 2 weeks. Personal history of smoking. The patient quit smoking 15 years ago.  EXAM: CT ANGIOGRAPHY CHEST WITH CONTRAST  TECHNIQUE: Multidetector CT imaging of the chest was performed using the standard protocol during bolus administration of intravenous contrast. Multiplanar CT image reconstructions and MIPs were obtained to evaluate the vascular anatomy.  CONTRAST:  151m OMNIPAQUE IOHEXOL 350 MG/ML SOLN  COMPARISON:  Two-view chest x-ray 07/22/2015.  FINDINGS: Pulmonary arterial opacification is satisfactory. The study is mildly degraded by patient breathing motion. No focal filling defects are evident to suggest pulmonary emboli.  The heart is mildly  enlarged. Coronary artery calcifications are present. There is no significant pericardial effusion.  A precarinal lymph node measures 1.2 cm in short axis. Subcentimeter prevascular lymph nodes are present. No other pathologically enlarged nodes are present. No significant axillary nodes are present.  Small moderate bilateral pleural effusions are present. There is some associated atelectasis.  Diffuse ground-glass attenuation is present throughout both lungs, likely representing edema. No significant airspace consolidation is present.  The bone windows demonstrate exaggerated kyphosis in the lower thoracic spine. No focal lytic or blastic lesions are present.  Review of the MIP images confirms the above findings.  IMPRESSION: 1. No evidence for pulmonary embolus. 2. Cardiomegaly, interstitial edema, and bilateral pleural effusions are compatible with congestive heart failure. 3. No focal airspace opacification other than dependent atelectasis. 4. Enlarged precarinal lymph node. Other smaller mediastinal nodes are present as well. These are likely reactive in the setting of congestive heart failure. These results will be called to the ordering clinician or representative by the Radiologist Assistant, and communication documented in the PACS or zVision Dashboard.   Electronically Signed   By: CSan MorelleM.D.   On: 07/28/2015 11:15   Ct Chest High Resolution  08/07/2015   CLINICAL DATA:  80year old female with history of diastolic congestive heart failure. Dyspnea on exertion. Weight gain. Orthopnea new increasing edema. History of asbestos exposure.  EXAM: CT CHEST WITHOUT CONTRAST  TECHNIQUE: Multidetector CT imaging of the chest was performed following the standard protocol without intravenous contrast. High resolution imaging of the lungs, as well as inspiratory and expiratory imaging, was performed.  COMPARISON:  Chest CT 07/28/2015.  FINDINGS: Mediastinum/Lymph Nodes: Heart size is mildly enlarged.  There is no significant pericardial fluid, thickening or pericardial calcification. There is atherosclerosis of the thoracic aorta, the great vessels of the mediastinum and the coronary arteries, including calcified atherosclerotic plaque in the left anterior descending and right coronary arteries. Calcifications of the mitral annulus. Calcifications of the aortic valve. Multiple prominent borderline enlarged and mildly enlarged mediastinal lymph nodes, measuring up to 12 mm in short axis in the low right paratracheal station (unchanged). Esophagus is unremarkable in appearance. No axillary lymphadenopathy.  Lungs/Pleura: Compared to the prior examination from 07/28/2015 there are multiple nodular and mass-like areas of peribronchovascular airspace consolidation now noted in the lower lobes of the lungs bilaterally, presumably infectious or inflammatory in etiology given their rapid development. There are additional patchy areas of ground-glass attenuation scattered throughout the lungs bilaterally, most confluent in the apex of the right upper lobe. Background of mild interlobular septal thickening, suggestive of a background of mild interstitial pulmonary edema. Previously noted small bilateral pleural effusions have nearly completely resolved (trace bilateral pleural effusions are noted on today's examination). Evaluation of high-resolution images is  limited given the acute findings on today's examination. There are some areas of mild subpleural reticulation, however, this could simply be related to underlying edema. No calcified pleural plaques. Inspiratory and expiratory imaging demonstrates extensive air trapping, indicative of small airways disease.  Upper Abdomen: Status post cholecystectomy. Slightly nodular contour of the liver, could indicate early changes of cirrhosis. Atherosclerosis.  Musculoskeletal/Soft Tissues: There are no aggressive appearing lytic or blastic lesions noted in the visualized  portions of the skeleton.  IMPRESSION: 1. Assessment for underlying interstitial lung disease is significantly limited on today's examination secondary to multiple acute findings, as discussed above. No overt changes suggestive of interstitial lung disease are confidently identified. If there continues to be clinical concern for interstitial lung disease, follow-up outpatient evaluation with high-resolution chest CT in 6-12 months would be recommended. 2. Interval development of multifocal peribronchovascular nodular and mass-like appearing airspace consolidation most evident in the lower lobes of the lungs bilaterally (left greater than right), presumably infectious or inflammatory in etiology. 3. Extensive air trapping, indicative of small airways disease. 4. No calcified pleural plaques to suggest asbestos related pleural disease. 5. Mild diffuse interlobular septal thickening, likely to reflect a background of mild interstitial pulmonary edema. Small bilateral pleural effusions have nearly completely resolved compared to the prior examination. 6. Mild cardiomegaly. 7. Atherosclerosis, including 2 vessel coronary artery disease. Assessment for potential risk factor modification, dietary therapy or pharmacologic therapy may be warranted, if clinically indicated. 8. Additional incidental findings, as above.   Electronically Signed   By: Vinnie Langton M.D.   On: 08/07/2015 12:29   Dg Chest Port 1 View  08/07/2015   CLINICAL DATA:  Shortness of Breath  EXAM: PORTABLE CHEST - 1 VIEW  COMPARISON:  08/04/2015  FINDINGS: Cardiac shadow remains enlarged. Prominent vascular congestion is again right greater than left with interstitial changes changes have progressed slightly in the interval from the prior exam although some of this may be technically related. Aortic calcifications are again seen. The bony structures are stable.  IMPRESSION: Changes consistent with CHF which have worsened slightly in the interval from  the prior exam.   Electronically Signed   By: Inez Catalina M.D.   On: 08/07/2015 07:39   Dg Chest Port 1 View  07/30/2015   CLINICAL DATA:  Patient with shortness of breath since last Saturday. Recent diagnosis congestive heart failure.  EXAM: PORTABLE CHEST - 1 VIEW  COMPARISON:  Chest CT 07/28/2015; chest radiograph 07/22/2015  FINDINGS: Monitoring leads overlie the patient. Cardiomegaly. Moderate left and small right pleural effusions. Bilateral perihilar interstitial pulmonary opacities. No pneumothorax.  IMPRESSION: Cardiomegaly with increasing interstitial opacities concerning for pulmonary edema. Moderate left and small right pleural effusions.   Electronically Signed   By: Lovey Newcomer M.D.   On: 07/30/2015 21:18   High Resolution CT 08/07/15:  IMPRESSION: 1. Assessment for underlying interstitial lung disease is significantly limited on today's examination secondary to multiple acute findings, as discussed above. No overt changes suggestive of interstitial lung disease are confidently identified. If there continues to be clinical concern for interstitial lung disease, follow-up outpatient evaluation with high-resolution chest CT in 6-12 months would be recommended. 2. Interval development of multifocal peribronchovascular nodular and mass-like appearing airspace consolidation most evident in the lower lobes of the lungs bilaterally (left greater than right), presumably infectious or inflammatory in etiology. 3. Extensive air trapping, indicative of small airways disease. 4. No calcified pleural plaques to suggest asbestos related pleural disease. 5. Mild diffuse interlobular septal  thickening, likely to reflect a background of mild interstitial pulmonary edema. Small bilateral pleural effusions have nearly completely resolved compared to the prior examination. 6. Mild cardiomegaly. 7. Atherosclerosis, including 2 vessel coronary artery disease. Assessment for potential risk factor  modification, dietary therapy or pharmacologic therapy may be warranted, if clinically indicated. 8. Additional incidental findings, as above.   ASSESSMENT AND PLAN  Ms. Casselman is a 80 yo female with PMHx of diastolic CHF (echo 12/28/08 EF 65-70%), HTN, Hypothyroidism, and sinus bradycardia who presented to the ED on 07/30/15 with complaint of DOE, weight gain, orthopnea and increased edema. W/u including CXR and BNP c/w acute CHF exacerbation and she has been diuresed. She has had continued problems with dyspnea and hypoxemia out of proportion to her cardiac disease and pulmonology has been consulted.   Dyspnea/Hypoxia- patient seen by PCCM. CT shows infiltrates of unknown etiology (viral or spontaneous clearing of HCAP or BOOP/ILD).  -- ECHO with bubble study negative for cardiac or pulmonary shunt. -- PCCM suspects underlying inflammatory process that is autoimmune in etiology given her elevated ESR & rheumatoid factor. An infectious etiology cannot be completely ruled out despite negative Procalcitonin from her serum. Serum ANA & anti-CCP are pending.  Procalcitonin remains undetectable indicating low probability for an infectious etiology.  This is not a cardiac issue.  She has a sed rate of 92 and positive RF.  I think she should be on steroids but no one has started them?  Pulmonary ? IM ? Now that her Cr is elevated discussed right heart cath with her to measure pressures and see where with stand with EDP and PA pressure Will start on solumedrol 60 mg daily.  Plan right heart cath for Monday.  Not clear why weights are up 10 lbs but has had 10 L overall diuresis   A/C Diastolic CHF: EF normal at 65-70%.   See above right heart planned for tomorrow   HTN: BP is well controlled- a little soft currently. Her home Prinzide has been held since admission. I would continue to hold this at discharge. BP meds can be re-evaluated as an outpatient  Acute Kidney Injury:  See nephrology note.   Although diuresis, ACE and low BP likely explanation ? Goodpastures or other pulmonary/renal syndrome  Hypokalemia: repleted  Sinus bradycardia- She has had bradycardia for a long time and atenolol stopped 2 weeks ago. This is not felt to be contributing to her SOB.    Signed, Jenkins Rouge PA-C

## 2015-08-14 NOTE — Progress Notes (Signed)
Patient ID: Colleen [name], female   DOB: March 21, 1935, 80 y.o.   MRN: [id number]   Patient Name: Colleen [name] Date of Encounter: 08/14/2015     Principal Problem:   Acute diastolic CHF (congestive heart failure) Active Problems:   Hypothyroidism   Hyperlipidemia   Essential hypertension   Acute respiratory failure with hypoxia   AKI (acute kidney injury)   Sinus bradycardia   Hyponatremia   Acute kidney injury (nontraumatic)   Acute on chronic respiratory failure   Pulmonary infiltrate    SUBJECTIVE  Right heart Cath today : Conclusion    Elevated right and left heart filling pressures.  Primarily pulmonary venous hypertension.   She will need ongoing diuresis, will increase Lasix to 80 mg IV every 8 hrs for now, follow creatinine closely.      Right Heart Pressures Procedural Findings: Hemodynamics (mmHg) RA mean 20 RV 72/20 PA 72/27, mean 39 PCWP mean 29, prominent V-waves  Oxygen saturations: PA 65% AO 97%  Cardiac Output (Fick) 5.75  Cardiac Index (Fick) 3.06  PVR 1.7 WU      CURRENT MEDS . enoxaparin (LOVENOX) injection  30 mg Subcutaneous Q24H  . furosemide  80 mg Intravenous Q8H  . levothyroxine  100 mcg Oral QAC breakfast  . methylPREDNISolone (SOLU-MEDROL) injection  60 mg Intravenous Daily  . midodrine  5 mg Oral TID WC  . polyethylene glycol  17 g Oral BID  . potassium chloride  40 mEq Oral Once  . sodium chloride  3 mL Intravenous Q12H  . sodium chloride  3 mL Intravenous Q12H  . tiotropium  18 mcg Inhalation Daily    OBJECTIVE  Filed Vitals:   08/14/15 0932 08/14/15 0937 08/14/15 0942 08/14/15 0946  BP: 154/64 135/63 139/62 137/66  Pulse: 45 50 50 68  Temp:      TempSrc:      Resp: _0 Height:      Weight:      SpO2: 91% 93% 95% 96%    Intake/Output Summary (Last 24 hours) at 08/14/15 1008 Last data filed at 08/14/15 0830  Gross per 24 hour  Intake    820 ml  Output   2600 ml  Net  -1780 ml   Filed Weights     08/12/15 0500 08/13/15 0629 08/14/15 0551  Weight: 82.3 kg (181 lb 7 oz) 81.5 kg (179 lb 10.8 oz) 81.4 kg (179 lb 7.3 oz)    PHYSICAL EXAM   PE: General:Pleasant affect, NAD Skin:Warm and dry, brisk capillary refill HEENT:normocephalic, sclera clear, mucus membranes moist Neck:supple, no JVD Heart:S1S2 RRR SEM murmur, gallup, rub or click Lungs: few rales, rhonchi, or wheezes YDX:AJOI, non tender, + BS, do not palpate liver spleen or masses Ext:no lower ext edema, 2+ pedal pulses, 2+ radial pulses, right bracial vein cath site looks good.  Neuro:alert and oriented X 3, MAE, follows commands, + facial symmetry  Accessory Clinical Findings  CBC Basic Metabolic Panel  Recent Labs  08/12/15 0305 08/13/15 0435 08/14/15 0338  NA 127* 128* 131*  K 4.3 4.6 3.8  CL 90* 91* 91*  CO2 28 27 32  GLUCOSE 133* 147* 127*  BUN 72* 65* 60*  CREATININE 2.33* 1.34* 1.18*  CALCIUM 8.8* 9.2 9.1  MG  --  2.3 2.1  PHOS 5.6*  --   --     TELE  Sinus brady with HR in high 40s and 50s.   Radiology/Studies  Dg Chest 2 View  08/04/2015  CLINICAL DATA:  Shortness of breath, low oxygen saturation  EXAM: CHEST  2 VIEW  COMPARISON:  07/30/2015  FINDINGS: There is stable cardiomegaly. There are trace bilateral pleural effusions. There is bilateral diffuse interstitial thickening. There is no pneumothorax. There is no acute osseous abnormality.  IMPRESSION: Findings most consistent with mild CHF.   Electronically Signed   By: Kathreen Devoid   On: 08/04/2015 12:36   Dg Chest 2 View  07/22/2015   CLINICAL DATA:  Shortness of breath, dry cough.  EXAM: CHEST  2 VIEW  COMPARISON:  02/20/2015  FINDINGS: There is mild cardiomegaly and vascular congestion. No overt edema. No confluent opacities or effusions. No acute bony abnormality.  IMPRESSION: Cardiomegaly, vascular congestion.   Electronically Signed   By: Rolm Baptise M.D.   On: 07/22/2015 18:48   Ct Angio Chest Pe W/cm &/or Wo Cm  07/28/2015    CLINICAL DATA:  Shortness breath for 2 weeks. Personal history of smoking. The patient quit smoking 15 years ago.  EXAM: CT ANGIOGRAPHY CHEST WITH CONTRAST  TECHNIQUE: Multidetector CT imaging of the chest was performed using the standard protocol during bolus administration of intravenous contrast. Multiplanar CT image reconstructions and MIPs were obtained to evaluate the vascular anatomy.  CONTRAST:  153m OMNIPAQUE IOHEXOL 350 MG/ML SOLN  COMPARISON:  Two-view chest x-ray 07/22/2015.  FINDINGS: Pulmonary arterial opacification is satisfactory. The study is mildly degraded by patient breathing motion. No focal filling defects are evident to suggest pulmonary emboli.  The heart is mildly enlarged. Coronary artery calcifications are present. There is no significant pericardial effusion.  A precarinal lymph node measures 1.2 cm in short axis. Subcentimeter prevascular lymph nodes are present. No other pathologically enlarged nodes are present. No significant axillary nodes are present.  Small moderate bilateral pleural effusions are present. There is some associated atelectasis.  Diffuse ground-glass attenuation is present throughout both lungs, likely representing edema. No significant airspace consolidation is present.  The bone windows demonstrate exaggerated kyphosis in the lower thoracic spine. No focal lytic or blastic lesions are present.  Review of the MIP images confirms the above findings.  IMPRESSION: 1. No evidence for pulmonary embolus. 2. Cardiomegaly, interstitial edema, and bilateral pleural effusions are compatible with congestive heart failure. 3. No focal airspace opacification other than dependent atelectasis. 4. Enlarged precarinal lymph node. Other smaller mediastinal nodes are present as well. These are likely reactive in the setting of congestive heart failure. These results will be called to the ordering clinician or representative by the Radiologist Assistant, and communication documented  in the PACS or zVision Dashboard.   Electronically Signed   By: CSan MorelleM.D.   On: 07/28/2015 11:15   Ct Chest High Resolution  08/07/2015   CLINICAL DATA:  80year old female with history of diastolic congestive heart failure. Dyspnea on exertion. Weight gain. Orthopnea new increasing edema. History of asbestos exposure.  EXAM: CT CHEST WITHOUT CONTRAST  TECHNIQUE: Multidetector CT imaging of the chest was performed following the standard protocol without intravenous contrast. High resolution imaging of the lungs, as well as inspiratory and expiratory imaging, was performed.  COMPARISON:  Chest CT 07/28/2015.  FINDINGS: Mediastinum/Lymph Nodes: Heart size is mildly enlarged. There is no significant pericardial fluid, thickening or pericardial calcification. There is atherosclerosis of the thoracic aorta, the great vessels of the mediastinum and the coronary arteries, including calcified atherosclerotic plaque in the left anterior descending and right coronary arteries. Calcifications of the mitral annulus. Calcifications of the aortic valve. Multiple prominent  borderline enlarged and mildly enlarged mediastinal lymph nodes, measuring up to 12 mm in short axis in the low right paratracheal station (unchanged). Esophagus is unremarkable in appearance. No axillary lymphadenopathy.  Lungs/Pleura: Compared to the prior examination from 07/28/2015 there are multiple nodular and mass-like areas of peribronchovascular airspace consolidation now noted in the lower lobes of the lungs bilaterally, presumably infectious or inflammatory in etiology given their rapid development. There are additional patchy areas of ground-glass attenuation scattered throughout the lungs bilaterally, most confluent in the apex of the right upper lobe. Background of mild interlobular septal thickening, suggestive of a background of mild interstitial pulmonary edema. Previously noted small bilateral pleural effusions have nearly  completely resolved (trace bilateral pleural effusions are noted on today's examination). Evaluation of high-resolution images is limited given the acute findings on today's examination. There are some areas of mild subpleural reticulation, however, this could simply be related to underlying edema. No calcified pleural plaques. Inspiratory and expiratory imaging demonstrates extensive air trapping, indicative of small airways disease.  Upper Abdomen: Status post cholecystectomy. Slightly nodular contour of the liver, could indicate early changes of cirrhosis. Atherosclerosis.  Musculoskeletal/Soft Tissues: There are no aggressive appearing lytic or blastic lesions noted in the visualized portions of the skeleton.  IMPRESSION: 1. Assessment for underlying interstitial lung disease is significantly limited on today's examination secondary to multiple acute findings, as discussed above. No overt changes suggestive of interstitial lung disease are confidently identified. If there continues to be clinical concern for interstitial lung disease, follow-up outpatient evaluation with high-resolution chest CT in 6-12 months would be recommended. 2. Interval development of multifocal peribronchovascular nodular and mass-like appearing airspace consolidation most evident in the lower lobes of the lungs bilaterally (left greater than right), presumably infectious or inflammatory in etiology. 3. Extensive air trapping, indicative of small airways disease. 4. No calcified pleural plaques to suggest asbestos related pleural disease. 5. Mild diffuse interlobular septal thickening, likely to reflect a background of mild interstitial pulmonary edema. Small bilateral pleural effusions have nearly completely resolved compared to the prior examination. 6. Mild cardiomegaly. 7. Atherosclerosis, including 2 vessel coronary artery disease. Assessment for potential risk factor modification, dietary therapy or pharmacologic therapy may be  warranted, if clinically indicated. 8. Additional incidental findings, as above.   Electronically Signed   By: Vinnie Langton M.D.   On: 08/07/2015 12:29   Dg Chest Port 1 View  08/14/2015   CLINICAL DATA:  Pneumonia .  EXAM: PORTABLE CHEST - 1 VIEW  COMPARISON:  08/13/2015.  FINDINGS: Mediastinum and hilar structures are normal. Stable cardiomegaly with pulmonary vascular prominence and bilateral interstitial prominence with small left pleural effusion. Findings consistent with congestive heart failure. Slight worsening from prior exam. No pneumothorax. Surgical clips left neck.  IMPRESSION: Cardiomegaly with pulmonary vascular prominence and bilateral interstitial prominence with small left pleural effusion. Findings consistent with congestive heart failure. Slight worsening from prior exam.   Electronically Signed   By: Marcello Moores  Register   On: 08/14/2015 07:29   Dg Chest Port 1 View  08/13/2015   CLINICAL DATA:  Patient with history pleural effusion.  CHF.  EXAM: PORTABLE CHEST - 1 VIEW  COMPARISON:  Chest radiograph 08/07/2015  FINDINGS: Monitoring leads overlie the patient. Stable enlarged cardiac and mediastinal contours. Interval improvement in right-greater-than-left airspace opacities. Probable small bilateral pleural effusions.  IMPRESSION: Interval improvement in bilateral airspace opacities, likely secondary to improving edema or infection. Continued radiographic follow-up is recommended to ensure complete resolution.   Electronically Signed  By: Lovey Newcomer M.D.   On: 08/13/2015 10:01   Dg Chest Port 1 View  08/07/2015   CLINICAL DATA:  Shortness of Breath  EXAM: PORTABLE CHEST - 1 VIEW  COMPARISON:  08/04/2015  FINDINGS: Cardiac shadow remains enlarged. Prominent vascular congestion is again right greater than left with interstitial changes changes have progressed slightly in the interval from the prior exam although some of this may be technically related. Aortic calcifications are again  seen. The bony structures are stable.  IMPRESSION: Changes consistent with CHF which have worsened slightly in the interval from the prior exam.   Electronically Signed   By: Inez Catalina M.D.   On: 08/07/2015 07:39   Dg Chest Port 1 View  07/30/2015   CLINICAL DATA:  Patient with shortness of breath since last Saturday. Recent diagnosis congestive heart failure.  EXAM: PORTABLE CHEST - 1 VIEW  COMPARISON:  Chest CT 07/28/2015; chest radiograph 07/22/2015  FINDINGS: Monitoring leads overlie the patient. Cardiomegaly. Moderate left and small right pleural effusions. Bilateral perihilar interstitial pulmonary opacities. No pneumothorax.  IMPRESSION: Cardiomegaly with increasing interstitial opacities concerning for pulmonary edema. Moderate left and small right pleural effusions.   Electronically Signed   By: Lovey Newcomer M.D.   On: 07/30/2015 21:18   High Resolution CT 08/07/15:  IMPRESSION: 1. Assessment for underlying interstitial lung disease is significantly limited on today's examination secondary to multiple acute findings, as discussed above. No overt changes suggestive of interstitial lung disease are confidently identified. If there continues to be clinical concern for interstitial lung disease, follow-up outpatient evaluation with high-resolution chest CT in 6-12 months would be recommended.  2. Interval development of multifocal peribronchovascular nodular and mass-like appearing airspace consolidation most evident in the lower lobes of the lungs bilaterally (left greater than right), presumably infectious or inflammatory in etiology. 3. Extensive air trapping, indicative of small airways disease. 4. No calcified pleural plaques to suggest asbestos related pleural disease. 5. Mild diffuse interlobular septal thickening, likely to reflect a background of mild interstitial pulmonary edema. Small bilateral pleural effusions have nearly completely resolved compared to the prior  examination. 6. Mild cardiomegaly. 7. Atherosclerosis, including 2 vessel coronary artery disease. Assessment for potential risk factor modification, dietary therapy or pharmacologic therapy may be warranted, if clinically indicated. 8. Additional incidental findings, as above.   ASSESSMENT AND PLAN  Ms. Torelli is a 80 yo female with PMHx of diastolic CHF (echo 05/29/43 EF 65-70%), HTN, Hypothyroidism, and sinus bradycardia who presented to the ED on 07/30/15 with complaint of DOE, weight gain, orthopnea and increased edema. W/u including CXR and BNP c/w acute CHF exacerbation and she has been diuresed. She has had continued problems with dyspnea and hypoxemia out of proportion to her cardiac disease and pulmonology has been consulted.   Dyspnea/Hypoxia- patient seen by PCCM. CT shows infiltrates of unknown etiology (viral or spontaneous clearing of HCAP or BOOP/ILD).  -- ECHO with bubble study negative for cardiac or pulmonary shunt. -- PCCM suspects underlying inflammatory process that is autoimmune in etiology given her elevated ESR & rheumatoid factor. An infectious etiology cannot be completely ruled out despite negative Procalcitonin from her serum. Serum ANA & anti-CCP are pending.  Procalcitonin remains undetectable indicating low probability for an infectious etiology. Prednisone was started this past weekend   A/C Diastolic CHF: EF normal at 65-70%.   Has elevated filling pressures.  Lasix has been increased.  HTN: BP is well controlled- a little soft currently. Her home Prinzide has  been held since admission. I would continue to hold this at discharge. BP meds can be re-evaluated as an outpatient  Acute Kidney Injury:  See nephrology note.  Although diuresis, ACE and low BP likely explanation ? Goodpastures or other pulmonary/renal syndrome  Hypokalemia: repleted  Sinus bradycardia- She has had bradycardia for a long time and atenolol stopped 2 weeks ago. This is not felt to be  contributing to her SOB.      Massiel Stipp, Wonda Cheng, MD  08/14/2015 10:12 AM    [address],  [place], [place]  [address] Pager [phone] Phone: [phone]; Fax: [phone]   [facility]  [address] [place], [place]  [address] [phone]   Fax [phone]

## 2015-08-14 NOTE — Telephone Encounter (Signed)
Pt husband returned call  [phone]

## 2015-08-14 NOTE — Telephone Encounter (Signed)
Ok. Thank you for the update

## 2015-08-14 NOTE — Telephone Encounter (Signed)
No charge if she missed due to hospitalization

## 2015-08-14 NOTE — Telephone Encounter (Signed)
Patient was in the hospital

## 2015-08-14 NOTE — Telephone Encounter (Signed)
Update..the patient was in the hospital

## 2015-08-14 NOTE — Progress Notes (Addendum)
PATIENT DETAILS Name: Colleen [name] Age: 80 y.o. Sex: female Date of Birth: 1935-06-21 Admit Date: 07/30/2015 Admitting Physician Theressa Millard, MD UEK:[id number] [name], DO  Brief narrative:   80 year old female with history of diastolic heart failure admitted on 8/7 with worsening shortness of breath. She was initially felt to have acute diastolic heart failure, and was started on IV Lasix. In spite of aggressive diuresis, and with her being below her dry weight-she continued to have shortness of breath, x-ray during this time showed worsening congestive heart failure. She subsequently underwent a high-resolution CT of the chest that showed new minus-like appearing airspace consolidation-highly suggestive of a inflammatory process/autoimmune process. PCCM was consulted, bronchoscopy was attempted-could not be completed because of worsening bradycardia. Serological workup so far is positive for a significantly elevated rheumatoid factor. PCCM is contemplating starting patient on steroids once further serological tests are back. Unfortunately latter half of the hospital course has been complicated by development of acute renal failure, likely hemodynamically mediated due to borderline hypotension. Please see below for further details  Subjective:  Patient in bed, denies any headache chest pain, does have shortness of breath but better than what she was upon admission, no focal weakness.  Assessment/Plan:   1. Acute hypoxic respiratory failure: Due to combination of acute on chronic diastolic CHF along with nonspecific inflammatory change in the lungs   A. Acute on chronic diastolic CHF. Has been adequately diuresed, cardiology on board, echocardiogram shows EF of 65% without any wall motion abnormalities, currently not on diuretic, cardiology on board and they are contemplating right heart cath  [facility] Weights   08/12/15 0500 08/13/15 0629 08/14/15 0551  Weight: 82.3 kg  (181 lb 7 oz) 81.5 kg (179 lb 10.8 oz) 81.4 kg (179 lb 7.3 oz)    B. Nonspecific lung infiltrates on CT angiogram and CT chest high resolution. No PE on CT angiogram chest and on lower extremity duplex, pulmonary on board, she is still short of breath and hypoxic, she is currently on Solu-Medrol since 08/12/2015 with some clinical improvement per pulmonary switch to oral prednisone on 08/15/2015 thereafter outpatient pulmonary follow-up. Pulmonary following as well. Bronchoscopy could not be done earlier due to severe bradycardia. Continue supportive care with oxygen nebulizer treatment. We'll request pulmonary to evaluate and advise on further treatment plan. Her rheumatoid factor is high, ANA negative, anti-CCP antibody pending, pro-calcitonin unremarkable, ESR 92.  She underwent a right heart catheterization by cardiology a few hours ago, report pending.      ARF with hyponatremia: No evidence of fluid overload, blood pressure borderline, placed on midodrine, renal consulted. We'll defer further management of ARF and hyponatremia to nephrology. Currently being gently diuresed with Lasix by nephrology.   Sinus bradycardia: Heart rate is stable in the 50s, continue to monitor off beta blockers. History of Mobitz type II AV block as well as junctional bradycardia which is likely not contributing to her SOB per cardiology. Cardiology on board who recommended no further treatment. She used to be on beta blocker but has been off for the last 2 weeks. TSH pending.  Hypertension: BP now soft-diuretics on hold- on midodrine  Hypothyroidism: Continue with Synthroid, Stable TSH .  Hyperlipidemia: Resume niacin on discharge     Disposition: Remain inpatient  Antimicrobial agents  See below  Anti-infectives    None      DVT Prophylaxis: Prophylactic Lovenox   Code Status: Full  code   Family Communication None at betsiytde  Procedures:  Right heart catheterization by cardiology on  08/14/2015.   TTE  - Left ventricle: The cavity size was normal. Wall thickness wasnormal. Systolic function was vigorous. The estimated ejectionfraction was in the range of 65% to 70%. Wall motion was normal;there were no regional wall motion abnormalities. - Mitral valve: Mildly calcified annulus. There was no significantregurgitation. - Left atrium: The atrium was moderately to severely dilated. - Right ventricle: The cavity size was mildly dilated. Systolicfunction was normal. - Right atrium: Prominent Eustachian valve. - Atrial septum: No defect or patent foramen ovale was identified.Echo contrast study showed no right-to-left atrial level shunt, at baseline or with provocation. - Tricuspid valve: Peak RV-RA gradient (S): 38 mm Hg. - Pulmonary arteries: PA peak pressure: 46 mm Hg (S). - Systemic veins: IVC measured 2.3 cm with > 50% respirophasicvariation, suggesting RA pressure 8 mmHg.  Impressions:  Limited echo for bubble study (negative).   CONSULTS:  cardiology, pulmonary/intensive care and nephrology  Time spent 30 minutes-Greater than 50% of this time was spent in counseling, explanation of diagnosis, planning of further management, and coordination of care.  MEDICATIONS: Scheduled Meds: . enoxaparin (LOVENOX) injection  30 mg Subcutaneous Q24H  . furosemide  80 mg Intravenous Q8H  . levothyroxine  100 mcg Oral QAC breakfast  . methylPREDNISolone (SOLU-MEDROL) injection  60 mg Intravenous Daily  . midodrine  5 mg Oral TID WC  . polyethylene glycol  17 g Oral BID  . potassium chloride  40 mEq Oral Once  . sodium chloride  3 mL Intravenous Q12H  . sodium chloride  3 mL Intravenous Q12H  . tiotropium  18 mcg Inhalation Daily   Continuous Infusions: . sodium chloride 1 mL/kg/hr (08/14/15 0135)   PRN Meds:.sodium chloride, sodium chloride, acetaminophen, calcium carbonate, fentaNYL, lidocaine, midazolam, ondansetron (ZOFRAN) IV, sodium chloride, sodium  chloride    PHYSICAL EXAM: Vital signs in last 24 hours: Filed Vitals:   08/14/15 0932 08/14/15 0937 08/14/15 0942 08/14/15 0946  BP: 154/64 135/63 139/62 137/66  Pulse: 45 50 50 68  Temp:      TempSrc:      Resp: '13 25 12 14  ' Height:      Weight:      SpO2: 91% 93% 95% 96%    Weight change: -0.1 kg (-3.5 oz) Filed Weights   08/12/15 0500 08/13/15 0629 08/14/15 0551  Weight: 82.3 kg (181 lb 7 oz) 81.5 kg (179 lb 10.8 oz) 81.4 kg (179 lb 7.3 oz)   Body mass index is 29.86 kg/(m^2).   Gen Exam: Awake and alert with clear speech.  Neck: Supple, No JVD.   Chest: B/L Clear except for a few bibasilar rales CVS: S1 S2 Regular, no murmurs.  Abdomen: soft, BS +, non tender, non distended.  Extremities: no edema, lower extremities warm to touch. Neurologic: Non Focal.   Skin: No Rash.   Wounds: N/A.   Intake/Output from previous day:  Intake/Output Summary (Last 24 hours) at 08/14/15 1138 Last data filed at 08/14/15 1114  Gross per 24 hour  Intake    820 ml  Output   2800 ml  Net  -1980 ml     LAB RESULTS: CBC  Recent Labs Lab 08/12/15 1211 08/14/15 0338  WBC 6.9 4.8  HGB 10.7* 10.0*  HCT 32.4* 30.8*  PLT 238 185  MCV 100.0 100.3*  MCH 33.0 32.6  MCHC 33.0 32.5  RDW 13.2 13.2  LYMPHSABS  --  0.8  MONOABS  --  0.5  EOSABS  --  0.0  BASOSABS  --  0.0    Chemistries   Recent Labs Lab 08/09/15 0351 08/10/15 0510 08/11/15 0526 08/12/15 0305 08/13/15 0435 08/14/15 0338  NA 133* 134* 126* 127* 128* 131*  K 2.9* 3.8 4.7 4.3 4.6 3.8  CL 84* 88* 88* 90* 91* 91*  CO2 37* 35* '25 28 27 ' 32  GLUCOSE 119* 94 90 133* 147* 127*  BUN 41* 46* 65* 72* 65* 60*  CREATININE 1.07* 2.23* 3.85* 2.33* 1.34* 1.18*  CALCIUM 9.2 9.1 8.6* 8.8* 9.2 9.1  MG 2.2  --   --   --  2.3 2.1    CBG: No results for input(s): GLUCAP in the last 168 hours.  GFR Estimated Creatinine Clearance: 42.1 mL/min (by C-G formula based on Cr of 1.18).  Coagulation profile  Recent  Labs Lab 08/12/15 1211  INR 1.20    Cardiac Enzymes No results for input(s): CKMB, TROPONINI, MYOGLOBIN in the last 168 hours.  Invalid input(s): CK  Invalid input(s): POCBNP No results for input(s): DDIMER in the last 72 hours. No results for input(s): HGBA1C in the last 72 hours. No results for input(s): CHOL, HDL, LDLCALC, TRIG, CHOLHDL, LDLDIRECT in the last 72 hours.  Recent Labs  08/13/15 1150  TSH 1.011   No results for input(s): VITAMINB12, FOLATE, FERRITIN, TIBC, IRON, RETICCTPCT in the last 72 hours. No results for input(s): LIPASE, AMYLASE in the last 72 hours.  Urine Studies No results for input(s): UHGB, CRYS in the last 72 hours.  Invalid input(s): UACOL, UAPR, USPG, UPH, UTP, UGL, UKET, UBIL, UNIT, UROB, ULEU, UEPI, UWBC, URBC, UBAC, CAST, UCOM, BILUA  MICROBIOLOGY: No results found for this or any previous visit (from the past 240 hour(s)).  RADIOLOGY STUDIES/RESULTS: Dg Chest 2 View  08/04/2015   CLINICAL DATA:  Shortness of breath, low oxygen saturation  EXAM: CHEST  2 VIEW  COMPARISON:  07/30/2015  FINDINGS: There is stable cardiomegaly. There are trace bilateral pleural effusions. There is bilateral diffuse interstitial thickening. There is no pneumothorax. There is no acute osseous abnormality.  IMPRESSION: Findings most consistent with mild CHF.   Electronically Signed   By: Kathreen Devoid   On: 08/04/2015 12:36   Dg Chest 2 View  07/22/2015   CLINICAL DATA:  Shortness of breath, dry cough.  EXAM: CHEST  2 VIEW  COMPARISON:  02/20/2015  FINDINGS: There is mild cardiomegaly and vascular congestion. No overt edema. No confluent opacities or effusions. No acute bony abnormality.  IMPRESSION: Cardiomegaly, vascular congestion.   Electronically Signed   By: Rolm Baptise M.D.   On: 07/22/2015 18:48   Ct Angio Chest Pe W/cm &/or Wo Cm  07/28/2015   CLINICAL DATA:  Shortness breath for 2 weeks. Personal history of smoking. The patient quit smoking 15 years ago.   EXAM: CT ANGIOGRAPHY CHEST WITH CONTRAST  TECHNIQUE: Multidetector CT imaging of the chest was performed using the standard protocol during bolus administration of intravenous contrast. Multiplanar CT image reconstructions and MIPs were obtained to evaluate the vascular anatomy.  CONTRAST:  127m OMNIPAQUE IOHEXOL 350 MG/ML SOLN  COMPARISON:  Two-view chest x-ray 07/22/2015.  FINDINGS: Pulmonary arterial opacification is satisfactory. The study is mildly degraded by patient breathing motion. No focal filling defects are evident to suggest pulmonary emboli.  The heart is mildly enlarged. Coronary artery calcifications are present. There is no significant pericardial effusion.  A precarinal lymph node measures 1.2 cm  in short axis. Subcentimeter prevascular lymph nodes are present. No other pathologically enlarged nodes are present. No significant axillary nodes are present.  Small moderate bilateral pleural effusions are present. There is some associated atelectasis.  Diffuse ground-glass attenuation is present throughout both lungs, likely representing edema. No significant airspace consolidation is present.  The bone windows demonstrate exaggerated kyphosis in the lower thoracic spine. No focal lytic or blastic lesions are present.  Review of the MIP images confirms the above findings.  IMPRESSION: 1. No evidence for pulmonary embolus. 2. Cardiomegaly, interstitial edema, and bilateral pleural effusions are compatible with congestive heart failure. 3. No focal airspace opacification other than dependent atelectasis. 4. Enlarged precarinal lymph node. Other smaller mediastinal nodes are present as well. These are likely reactive in the setting of congestive heart failure. These results will be called to the ordering clinician or representative by the Radiologist Assistant, and communication documented in the PACS or zVision Dashboard.   Electronically Signed   By: San Morelle M.D.   On: 07/28/2015 11:15    Ct Chest High Resolution  08/07/2015   CLINICAL DATA:  80 year old female with history of diastolic congestive heart failure. Dyspnea on exertion. Weight gain. Orthopnea new increasing edema. History of asbestos exposure.  EXAM: CT CHEST WITHOUT CONTRAST  TECHNIQUE: Multidetector CT imaging of the chest was performed following the standard protocol without intravenous contrast. High resolution imaging of the lungs, as well as inspiratory and expiratory imaging, was performed.  COMPARISON:  Chest CT 07/28/2015.  FINDINGS: Mediastinum/Lymph Nodes: Heart size is mildly enlarged. There is no significant pericardial fluid, thickening or pericardial calcification. There is atherosclerosis of the thoracic aorta, the great vessels of the mediastinum and the coronary arteries, including calcified atherosclerotic plaque in the left anterior descending and right coronary arteries. Calcifications of the mitral annulus. Calcifications of the aortic valve. Multiple prominent borderline enlarged and mildly enlarged mediastinal lymph nodes, measuring up to 12 mm in short axis in the low right paratracheal station (unchanged). Esophagus is unremarkable in appearance. No axillary lymphadenopathy.  Lungs/Pleura: Compared to the prior examination from 07/28/2015 there are multiple nodular and mass-like areas of peribronchovascular airspace consolidation now noted in the lower lobes of the lungs bilaterally, presumably infectious or inflammatory in etiology given their rapid development. There are additional patchy areas of ground-glass attenuation scattered throughout the lungs bilaterally, most confluent in the apex of the right upper lobe. Background of mild interlobular septal thickening, suggestive of a background of mild interstitial pulmonary edema. Previously noted small bilateral pleural effusions have nearly completely resolved (trace bilateral pleural effusions are noted on today's examination). Evaluation of  high-resolution images is limited given the acute findings on today's examination. There are some areas of mild subpleural reticulation, however, this could simply be related to underlying edema. No calcified pleural plaques. Inspiratory and expiratory imaging demonstrates extensive air trapping, indicative of small airways disease.  Upper Abdomen: Status post cholecystectomy. Slightly nodular contour of the liver, could indicate early changes of cirrhosis. Atherosclerosis.  Musculoskeletal/Soft Tissues: There are no aggressive appearing lytic or blastic lesions noted in the visualized portions of the skeleton.  IMPRESSION: 1. Assessment for underlying interstitial lung disease is significantly limited on today's examination secondary to multiple acute findings, as discussed above. No overt changes suggestive of interstitial lung disease are confidently identified. If there continues to be clinical concern for interstitial lung disease, follow-up outpatient evaluation with high-resolution chest CT in 6-12 months would be recommended. 2. Interval development of multifocal peribronchovascular nodular and  mass-like appearing airspace consolidation most evident in the lower lobes of the lungs bilaterally (left greater than right), presumably infectious or inflammatory in etiology. 3. Extensive air trapping, indicative of small airways disease. 4. No calcified pleural plaques to suggest asbestos related pleural disease. 5. Mild diffuse interlobular septal thickening, likely to reflect a background of mild interstitial pulmonary edema. Small bilateral pleural effusions have nearly completely resolved compared to the prior examination. 6. Mild cardiomegaly. 7. Atherosclerosis, including 2 vessel coronary artery disease. Assessment for potential risk factor modification, dietary therapy or pharmacologic therapy may be warranted, if clinically indicated. 8. Additional incidental findings, as above.   Electronically Signed    By: Vinnie Langton M.D.   On: 08/07/2015 12:29   Dg Chest Port 1 View  08/14/2015   CLINICAL DATA:  Pneumonia .  EXAM: PORTABLE CHEST - 1 VIEW  COMPARISON:  08/13/2015.  FINDINGS: Mediastinum and hilar structures are normal. Stable cardiomegaly with pulmonary vascular prominence and bilateral interstitial prominence with small left pleural effusion. Findings consistent with congestive heart failure. Slight worsening from prior exam. No pneumothorax. Surgical clips left neck.  IMPRESSION: Cardiomegaly with pulmonary vascular prominence and bilateral interstitial prominence with small left pleural effusion. Findings consistent with congestive heart failure. Slight worsening from prior exam.   Electronically Signed   By: Marcello Moores  Register   On: 08/14/2015 07:29   Dg Chest Port 1 View  08/13/2015   CLINICAL DATA:  Patient with history pleural effusion.  CHF.  EXAM: PORTABLE CHEST - 1 VIEW  COMPARISON:  Chest radiograph 08/07/2015  FINDINGS: Monitoring leads overlie the patient. Stable enlarged cardiac and mediastinal contours. Interval improvement in right-greater-than-left airspace opacities. Probable small bilateral pleural effusions.  IMPRESSION: Interval improvement in bilateral airspace opacities, likely secondary to improving edema or infection. Continued radiographic follow-up is recommended to ensure complete resolution.   Electronically Signed   By: Lovey Newcomer M.D.   On: 08/13/2015 10:01   Dg Chest Port 1 View  08/07/2015   CLINICAL DATA:  Shortness of Breath  EXAM: PORTABLE CHEST - 1 VIEW  COMPARISON:  08/04/2015  FINDINGS: Cardiac shadow remains enlarged. Prominent vascular congestion is again right greater than left with interstitial changes changes have progressed slightly in the interval from the prior exam although some of this may be technically related. Aortic calcifications are again seen. The bony structures are stable.  IMPRESSION: Changes consistent with CHF which have worsened slightly  in the interval from the prior exam.   Electronically Signed   By: Inez Catalina M.D.   On: 08/07/2015 07:39   Dg Chest Port 1 View  07/30/2015   CLINICAL DATA:  Patient with shortness of breath since last Saturday. Recent diagnosis congestive heart failure.  EXAM: PORTABLE CHEST - 1 VIEW  COMPARISON:  Chest CT 07/28/2015; chest radiograph 07/22/2015  FINDINGS: Monitoring leads overlie the patient. Cardiomegaly. Moderate left and small right pleural effusions. Bilateral perihilar interstitial pulmonary opacities. No pneumothorax.  IMPRESSION: Cardiomegaly with increasing interstitial opacities concerning for pulmonary edema. Moderate left and small right pleural effusions.   Electronically Signed   By: Lovey Newcomer M.D.   On: 07/30/2015 21:18    Thurnell Lose, MD  [facility] Pager:[phone]  If 7PM-7AM, please contact night-coverage [email or web address] Password [facility] 08/14/2015, 11:38 AM   LOS: 15 days

## 2015-08-15 LAB — BASIC METABOLIC PANEL
ANION GAP: 11 (ref 5–15)
BUN: 48 mg/dL — AB (ref 6–20)
CALCIUM: 9.3 mg/dL (ref 8.9–10.3)
CO2: 30 mmol/L (ref 22–32)
CREATININE: 1.04 mg/dL — AB (ref 0.44–1.00)
Chloride: 93 mmol/L — ABNORMAL LOW (ref 101–111)
GFR calc Af Amer: 59 mL/min — ABNORMAL LOW (ref >60)
GFR, EST NON AFRICAN AMERICAN: 50 mL/min — AB (ref >60)
GLUCOSE: 117 mg/dL — AB (ref 65–99)
Potassium: 4.1 mmol/L (ref 3.5–5.1)
Sodium: 134 mmol/L — ABNORMAL LOW (ref 135–145)

## 2015-08-15 MED ORDER — METOLAZONE 2.5 MG PO TABS
2.5000 mg | ORAL_TABLET | Freq: Once | ORAL | Status: AC
Start: 2015-08-15 — End: 2015-08-15
  Administered 2015-08-15: 2.5 mg via ORAL
  Filled 2015-08-15: qty 1

## 2015-08-15 MED ORDER — DIPHENHYDRAMINE HCL 25 MG PO CAPS
25.0000 mg | ORAL_CAPSULE | Freq: Every evening | ORAL | Status: DC | PRN
  Administered 2015-08-16: 25 mg via ORAL
  Filled 2015-08-15: qty 1

## 2015-08-15 MED ORDER — PREDNISONE 20 MG PO TABS
40.0000 mg | ORAL_TABLET | Freq: Every day | ORAL | Status: DC
  Administered 2015-08-15 – 2015-08-16 (×2): 40 mg via ORAL
  Filled 2015-08-15 (×2): qty 2

## 2015-08-15 MED FILL — Heparin Sodium (Porcine) 2 Unit/ML in Sodium Chloride 0.9%: INTRAMUSCULAR | Qty: 500 | Status: AC

## 2015-08-15 MED FILL — Lidocaine HCl Local Preservative Free (PF) Inj 1%: INTRAMUSCULAR | Qty: 30 | Status: AC

## 2015-08-15 NOTE — Progress Notes (Signed)
Physical Therapy Treatment Patient Details Name: Colleen [name] MRN: [id number] DOB: 12/09/1935 Today's Date: 08/15/2015    History of Present Illness 80 y.o. female with recent diagnosis of Diastolic CHF who presents to the ED with complaints of worsening SOB, DOE, Orthopnea and Edema of both lower legs over a 1 week period.    PT Comments    Pt progressing towards physical therapy goals. Was able to ambulate well in hall however O2 sats continue to drop during gait training. On 2L/min supplemental O2 initially and was increased to 3L/min when sats dropped to 84%. Pt was educated in self-monitoring fatigue level, pursed-lip breathing, and using RW for energy conservation. Will continue to follow and progress as able per POC.   Follow Up Recommendations  No PT follow up     Equipment Recommendations  None recommended by PT    Recommendations for Other Services       Precautions / Restrictions Precautions Precautions: Fall Precaution Comments: O2 sat ck Restrictions Weight Bearing Restrictions: No    Mobility  Bed Mobility               General bed mobility comments: Pt received up in the chair  Transfers Overall transfer level: Modified independent Equipment used: Rolling walker (2 wheeled);None             General transfer comment: Transferred with RW from recliner and no AD from the low toilet in room. Pt did not require any assistance.  Ambulation/Gait Ambulation/Gait assistance: Modified independent (Device/Increase time) Ambulation Distance (Feet): 375 Feet Assistive device: Rolling walker (2 wheeled) Gait Pattern/deviations: Step-through pattern;Decreased stride length Gait velocity: Decreased Gait velocity interpretation: Below normal speed for age/gender General Gait Details: Pt used RW for energy conservation mostly during session. Was able to ambulate in room without AD well. 1 standing rest break due to SOB/fatigue. O2 sats monitored and pt  dropped to 84% on 2L/min supplemental O2. O2 was increased to 3L/min and pt recovered to ~87% by the time she returned to the room, and up to 90% with seated rest break. Pt was returned to 2L/min after she reached sats of 90%.    Stairs         General stair comments: Pt declined stair training today.   Wheelchair Mobility    Modified Rankin (Stroke Patients Only)       Balance Overall balance assessment: No apparent balance deficits (not formally assessed)                                  Cognition Arousal/Alertness: Awake/alert Behavior During Therapy: WFL for tasks assessed/performed Overall Cognitive Status: Within Functional Limits for tasks assessed                      Exercises      General Comments General comments (skin integrity, edema, etc.): Verbally reviewed HEP and pt demonstrated a few reps of each.       Pertinent Vitals/Pain Pain Assessment: No/denies pain    Home Living                      Prior Function            PT Goals (current goals can now be found in the care plan section) Acute Rehab PT Goals Patient Stated Goal: to get better and get home PT Goal Formulation: With patient Time For Goal  Achievement: 08/24/15 Potential to Achieve Goals: Good Progress towards PT goals: Progressing toward goals    Frequency  Min 3X/week    PT Plan Current plan remains appropriate    Co-evaluation             End of Session Equipment Utilized During Treatment: Gait belt;Oxygen Activity Tolerance: Patient limited by fatigue;Treatment limited secondary to medical complications (Comment) (Decreased O2 status with mobility) Patient left: in chair;with call bell/phone within reach     Time: 4383-8184 PT Time Calculation (min) (ACUTE ONLY): 20 min  Charges:  $Gait Training: 8-22 mins                    G Codes:      [name] September 11, 2015, 2:53 PM   [name], PT, DPT Acute Rehabilitation  Services Pager: [phone]

## 2015-08-15 NOTE — Progress Notes (Signed)
Advanced Heart Failure Rounding Note   Subjective:   RHC with elevated filling pressures noted. Diuretic regimen increased. Negative 1.1 liters. Weight down 3 pounds.   Mid dyspnea with exertion   RHC 8/22 RA mean 20 RV 72/20 PA 72/27, mean 39 PCWP mean 29, prominent V-waves Oxygen saturations: PA 65% AO 97% Cardiac Output (Fick) 5.75  Cardiac Index (Fick) 3.06  PVR 1.7 WU  Objective:   Weight Range:  Vital Signs:   Temp:  [97.5 F (36.4 C)-97.9 F (36.6 C)] 97.7 F (36.5 C) (08/23 0609) Pulse Rate:  [45-99] 50 (08/23 0609) Resp:  [12-25] 18 (08/23 0609) BP: (133-178)/(59-82) 147/71 mmHg (08/23 0700) SpO2:  [91 %-96 %] 95 % (08/23 0609) Weight:  [176 lb 8 oz (80.06 kg)] 176 lb 8 oz (80.06 kg) (08/23 0544) Last BM Date: 08/13/15  Weight change: Filed Weights   08/13/15 0629 08/14/15 0551 08/15/15 0544  Weight: 179 lb 10.8 oz (81.5 kg) 179 lb 7.3 oz (81.4 kg) 176 lb 8 oz (80.06 kg)    Intake/Output:   Intake/Output Summary (Last 24 hours) at 08/15/15 0814 Last data filed at 08/15/15 0542  Gross per 24 hour  Intake    240 ml  Output    900 ml  Net   -660 ml     Physical Exam: General:  Well appearing. No resp difficulty. Sitting on the side of the bed.  HEENT: normal Neck: supple. JVP to jaw  Carotids 2+ bilat; no bruits. No lymphadenopathy or thryomegaly appreciated. Cor: PMI nondisplaced. Regular rate & rhythm. No rubs, gallops or murmurs. Lungs: clear Abdomen: soft, nontender, ++ distended. No hepatosplenomegaly. No bruits or masses. Good bowel sounds. Extremities: no cyanosis, clubbing, rash, edema Neuro: alert & orientedx3, cranial nerves grossly intact. moves all 4 extremities w/o difficulty. Affect pleasant  Telemetry: SR 60s   Labs: Basic Metabolic Panel:  Recent Labs Lab 08/09/15 0351  08/11/15 0526 08/12/15 0305 08/13/15 0435 08/14/15 0338 08/15/15 0557  NA 133*  < > 126* 127* 128* 131* 134*  K 2.9*  < > 4.7 4.3 4.6 3.8 4.1  CL 84*  <  > 88* 90* 91* 91* 93*  CO2 37*  < > '25 28 27 ' 32 30  GLUCOSE 119*  < > 90 133* 147* 127* 117*  BUN 41*  < > 65* 72* 65* 60* 48*  CREATININE 1.07*  < > 3.85* 2.33* 1.34* 1.18* 1.04*  CALCIUM 9.2  < > 8.6* 8.8* 9.2 9.1 9.3  MG 2.2  --   --   --  2.3 2.1  --   PHOS  --   --   --  5.6*  --   --   --   < > = values in this interval not displayed.  Liver Function Tests:  Recent Labs Lab 08/12/15 0305  ALBUMIN 3.2*   No results for input(s): LIPASE, AMYLASE in the last 168 hours. No results for input(s): AMMONIA in the last 168 hours.  CBC:  Recent Labs Lab 08/12/15 1211 08/14/15 0338  WBC 6.9 4.8  NEUTROABS  --  3.5  HGB 10.7* 10.0*  HCT 32.4* 30.8*  MCV 100.0 100.3*  PLT 238 185    Cardiac Enzymes: No results for input(s): CKTOTAL, CKMB, CKMBINDEX, TROPONINI in the last 168 hours.  BNP: BNP (last 3 results)  Recent Labs  07/22/15 1715 07/30/15 2050 08/06/15 1703  BNP 394.9* 346.3* 189.9*    ProBNP (last 3 results) No results for input(s): PROBNP in the last  8760 hours.    Other results:  Imaging: Dg Chest Port 1 View  08/14/2015   CLINICAL DATA:  Pneumonia .  EXAM: PORTABLE CHEST - 1 VIEW  COMPARISON:  08/13/2015.  FINDINGS: Mediastinum and hilar structures are normal. Stable cardiomegaly with pulmonary vascular prominence and bilateral interstitial prominence with small left pleural effusion. Findings consistent with congestive heart failure. Slight worsening from prior exam. No pneumothorax. Surgical clips left neck.  IMPRESSION: Cardiomegaly with pulmonary vascular prominence and bilateral interstitial prominence with small left pleural effusion. Findings consistent with congestive heart failure. Slight worsening from prior exam.   Electronically Signed   By: Marcello Moores  Register   On: 08/14/2015 07:29      Medications:     Scheduled Medications: . enoxaparin (LOVENOX) injection  40 mg Subcutaneous Q24H  . furosemide  80 mg Intravenous Q8H  . levothyroxine   100 mcg Oral QAC breakfast  . methylPREDNISolone (SOLU-MEDROL) injection  60 mg Intravenous Daily  . midodrine  5 mg Oral TID WC  . polyethylene glycol  17 g Oral BID  . tiotropium  18 mcg Inhalation Daily     Infusions: . sodium chloride 1 mL/kg/hr (08/14/15 0135)     PRN Medications:  acetaminophen, calcium carbonate, fentaNYL, lidocaine, midazolam, ondansetron (ZOFRAN) IV   Assessment/Plan :  Colleen [name] is a 80 yo female with PMHx of diastolic CHF (echo 12/27/95 EF 65-70%), HTN, hypothyroidism, and sinus bradycardia who presented to the ED on 07/30/15 with complaint of DOE, weight gain, orthopnea and increased edema.   1. Dyspnea/Hypoxia- patient seen by PCCM. CT shows infiltrates of unknown etiology with concern for inflammatory lung disease.  -- ECHO with bubble study negative for cardiac or pulmonary shunt. -- PCCM suspects underlying inflammatory process that is autoimmune in etiology given her elevated ESR & rheumatoid factor. An infectious etiology cannot be completely ruled out despite unremarkable PCT.  ANA negative.  Anti-CCP pending. Currently on Solumedrol. 2. A/C Diastolic CHF: EF normal at 65-70%, RV mildly dilated. RHC with elevated filling pressures noted as well as pulmonary venous hypertension. Lasix was increased 80 mg IV tid. Weight down 3 pounds.  -- Continue IV lasix 80 mg every 8 hours and add 2.5 mg metolazone today.  3. HTN: controlled. Continue current regimen.  4. Acute Kidney Injury: Renal function improving.  Will keep off ACEI.  Suspect AKI was mediated by hypotension.  She is now on midodrine but appears hypertensive.  Think we can probably hold this.  5. Hypokalemia: repleted 6. Sinus bradycardia: Off BB for the last 2 weeks. Rhythm has been a stable junctional rhythm here at times.  Will repeat ECG today.   Consult cardiac rehab.   Length of Stay: 16   Colleen [name],Colleen [name]  08/15/2015, 8:14 AM  Advanced Heart Failure Team Pager [phone] (M-F; 7a -  4p)  Please contact [facility] for night-coverage after hours (4p -7a ) and weekends on [email or web address]  Patient seen with NP, agree with the above note.  RHC with markedly elevated left and right heart filling pressures and pulmonary venous hypertension from diastolic CHF.  Will need ongoing diuresis as above.   Suspect co-existing inflammatory lung disease, on steroids per pulmonary service.   Renal function recovering, watch closely with diuresis.  Avoid ACEI and keep BP reasonable as AKI suspected due to hypotension.  Probably does not need midodrine at this point.   Colleen [name] 08/15/2015 8:46 AM

## 2015-08-15 NOTE — Progress Notes (Signed)
PATIENT DETAILS Name: Colleen [name] Age: 80 y.o. Sex: female Date of Birth: 1935/06/28 Admit Date: 07/30/2015 Admitting Physician Theressa Millard, MD YIR:SWNIOE [name], DO  Brief narrative:   80 year old female with history of diastolic heart failure admitted on 8/7 with worsening shortness of breath. She was initially felt to have acute diastolic heart failure, and was started on IV Lasix. In spite of aggressive diuresis, and with her being below her dry weight-she continued to have shortness of breath, x-ray during this time showed worsening congestive heart failure. She subsequently underwent a high-resolution CT of the chest that showed new minus-like appearing airspace consolidation-highly suggestive of a inflammatory process/autoimmune process. PCCM was consulted, bronchoscopy was attempted-could not be completed because of worsening bradycardia. Serological workup so far is positive for a significantly elevated rheumatoid factor. PCCM is contemplating starting patient on steroids once further serological tests are back. Unfortunately latter half of the hospital course has been complicated by development of acute renal failure, likely hemodynamically mediated due to borderline hypotension. Please see below for further details  Subjective:  Patient in bed, denies any headache chest pain, does have shortness of breath but better than what she was upon admission, no focal weakness.  Assessment/Plan:   1. Acute hypoxic respiratory failure: Due to combination of acute on chronic diastolic CHF along with nonspecific inflammatory change in the lungs   A. Acute on chronic diastolic CHF. Has been adequately diuresed, cardiology on board, echocardiogram shows EF of 65% without any wall motion abnormalities, currently not on diuretic, cardiology on board and they are contemplating right heart cath  [facility] Weights   08/13/15 0629 08/14/15 0551 08/15/15 0544  Weight: 81.5 kg  (179 lb 10.8 oz) 81.4 kg (179 lb 7.3 oz) 80.06 kg (176 lb 8 oz)    B. Nonspecific lung infiltrates on CT angiogram and CT chest high resolution. No PE on CT angiogram chest and on lower extremity duplex, pulmonary on board, she is still short of breath and hypoxic, she is currently on Solu-Medrol since 08/12/2015 with some clinical improvement per pulmonary switched to oral prednisone on 08/15/2015 thereafter outpatient pulmonary follow-up. Pulmonary following as well. Bronchoscopy could not be done earlier due to severe bradycardia. Continue supportive care with oxygen nebulizer treatment. We'll request pulmonary to evaluate and advise on further treatment plan. Her rheumatoid factor is high, ANA negative, anti-CCP antibody pending, pro-calcitonin unremarkable, ESR 92.  She underwent a right heart catheterization by cardiology on 08/14/2015 suggestive of pulmonary hypertension likely due to underlying diastolic dysfunction.      ARF with hyponatremia: . Currently being gently diuresed with Lasix by nephrology and cardiology.   Sinus bradycardia: Heart rate is stable in the 50s, continue to monitor off beta blockers. History of Mobitz type II AV block as well as junctional bradycardia which is likely not contributing to her SOB per cardiology. Cardiology on board who recommended no further treatment. She used to be on beta blocker but has been off for the last 2 weeks. TSH pending.  Hypertension: BP is stable continue to monitor closely stopped midodrine as she is now on prednisone with stable blood pressures.  Hypothyroidism: Continue with Synthroid, Stable TSH .  Hyperlipidemia: Resume niacin on discharge     Disposition: Remain inpatient  Antimicrobial agents  See below  Anti-infectives    None      DVT Prophylaxis: Prophylactic Lovenox   Code Status: Full code  Family Communication None at betsiytde  Procedures:  Right heart catheterization by cardiology on  08/14/2015.   TTE  - Left ventricle: The cavity size was normal. Wall thickness wasnormal. Systolic function was vigorous. The estimated ejectionfraction was in the range of 65% to 70%. Wall motion was normal;there were no regional wall motion abnormalities. - Mitral valve: Mildly calcified annulus. There was no significantregurgitation. - Left atrium: The atrium was moderately to severely dilated. - Right ventricle: The cavity size was mildly dilated. Systolicfunction was normal. - Right atrium: Prominent Eustachian valve. - Atrial septum: No defect or patent foramen ovale was identified.Echo contrast study showed no right-to-left atrial level shunt, at baseline or with provocation. - Tricuspid valve: Peak RV-RA gradient (S): 38 mm Hg. - Pulmonary arteries: PA peak pressure: 46 mm Hg (S). - Systemic veins: IVC measured 2.3 cm with > 50% respirophasicvariation, suggesting RA pressure 8 mmHg.  Impressions:  Limited echo for bubble study (negative).   CONSULTS:  cardiology, pulmonary/intensive care and nephrology  Time spent 30 minutes-Greater than 50% of this time was spent in counseling, explanation of diagnosis, planning of further management, and coordination of care.  MEDICATIONS: Scheduled Meds: . enoxaparin (LOVENOX) injection  40 mg Subcutaneous Q24H  . furosemide  80 mg Intravenous Q8H  . levothyroxine  100 mcg Oral QAC breakfast  . midodrine  5 mg Oral TID WC  . polyethylene glycol  17 g Oral BID  . predniSONE  40 mg Oral Q breakfast  . tiotropium  18 mcg Inhalation Daily   Continuous Infusions: . sodium chloride 1 mL/kg/hr (08/14/15 0135)   PRN Meds:.acetaminophen, calcium carbonate, fentaNYL, lidocaine, midazolam, ondansetron (ZOFRAN) IV    PHYSICAL EXAM: Vital signs in last 24 hours: Filed Vitals:   08/15/15 0544 08/15/15 0609 08/15/15 0700 08/15/15 0833  BP:  136/65 147/71   Pulse:  50    Temp:  97.7 F (36.5 C)    TempSrc:  Oral    Resp:  18      Height:      Weight: 80.06 kg (176 lb 8 oz)     SpO2:  95%  96%    Weight change: -1.34 kg (-2 lb 15.3 oz) Filed Weights   08/13/15 0629 08/14/15 0551 08/15/15 0544  Weight: 81.5 kg (179 lb 10.8 oz) 81.4 kg (179 lb 7.3 oz) 80.06 kg (176 lb 8 oz)   Body mass index is 29.37 kg/(m^2).   Gen Exam: Awake and alert with clear speech.  Neck: Supple, No JVD.   Chest: B/L Clear except for a few bibasilar rales CVS: S1 S2 Regular, no murmurs.  Abdomen: soft, BS +, non tender, non distended.  Extremities: no edema, lower extremities warm to touch. Neurologic: Non Focal.   Skin: No Rash.   Wounds: N/A.   Intake/Output from previous day:  Intake/Output Summary (Last 24 hours) at 08/15/15 1117 Last data filed at 08/15/15 0900  Gross per 24 hour  Intake    600 ml  Output    900 ml  Net   -300 ml     LAB RESULTS: CBC  Recent Labs Lab 08/12/15 1211 08/14/15 0338  WBC 6.9 4.8  HGB 10.7* 10.0*  HCT 32.4* 30.8*  PLT 238 185  MCV 100.0 100.3*  MCH 33.0 32.6  MCHC 33.0 32.5  RDW 13.2 13.2  LYMPHSABS  --  0.8  MONOABS  --  0.5  EOSABS  --  0.0  BASOSABS  --  0.0    Chemistries  Recent Labs Lab 08/09/15 0351  08/11/15 0526 08/12/15 0305 08/13/15 0435 08/14/15 0338 08/15/15 0557  NA 133*  < > 126* 127* 128* 131* 134*  K 2.9*  < > 4.7 4.3 4.6 3.8 4.1  CL 84*  < > 88* 90* 91* 91* 93*  CO2 37*  < > '25 28 27 ' 32 30  GLUCOSE 119*  < > 90 133* 147* 127* 117*  BUN 41*  < > 65* 72* 65* 60* 48*  CREATININE 1.07*  < > 3.85* 2.33* 1.34* 1.18* 1.04*  CALCIUM 9.2  < > 8.6* 8.8* 9.2 9.1 9.3  MG 2.2  --   --   --  2.3 2.1  --   < > = values in this interval not displayed.  CBG: No results for input(s): GLUCAP in the last 168 hours.  GFR Estimated Creatinine Clearance: 47.3 mL/min (by C-G formula based on Cr of 1.04).  Coagulation profile  Recent Labs Lab 08/12/15 1211  INR 1.20    Cardiac Enzymes No results for input(s): CKMB, TROPONINI, MYOGLOBIN in the last 168  hours.  Invalid input(s): CK  Invalid input(s): POCBNP No results for input(s): DDIMER in the last 72 hours. No results for input(s): HGBA1C in the last 72 hours. No results for input(s): CHOL, HDL, LDLCALC, TRIG, CHOLHDL, LDLDIRECT in the last 72 hours.  Recent Labs  08/13/15 1150  TSH 1.011   No results for input(s): VITAMINB12, FOLATE, FERRITIN, TIBC, IRON, RETICCTPCT in the last 72 hours. No results for input(s): LIPASE, AMYLASE in the last 72 hours.  Urine Studies No results for input(s): UHGB, CRYS in the last 72 hours.  Invalid input(s): UACOL, UAPR, USPG, UPH, UTP, UGL, UKET, UBIL, UNIT, UROB, ULEU, UEPI, UWBC, URBC, UBAC, CAST, UCOM, BILUA  MICROBIOLOGY: No results found for this or any previous visit (from the past 240 hour(s)).  RADIOLOGY STUDIES/RESULTS: Dg Chest 2 View  08/04/2015   CLINICAL DATA:  Shortness of breath, low oxygen saturation  EXAM: CHEST  2 VIEW  COMPARISON:  07/30/2015  FINDINGS: There is stable cardiomegaly. There are trace bilateral pleural effusions. There is bilateral diffuse interstitial thickening. There is no pneumothorax. There is no acute osseous abnormality.  IMPRESSION: Findings most consistent with mild CHF.   Electronically Signed   By: Kathreen Devoid   On: 08/04/2015 12:36   Dg Chest 2 View  07/22/2015   CLINICAL DATA:  Shortness of breath, dry cough.  EXAM: CHEST  2 VIEW  COMPARISON:  02/20/2015  FINDINGS: There is mild cardiomegaly and vascular congestion. No overt edema. No confluent opacities or effusions. No acute bony abnormality.  IMPRESSION: Cardiomegaly, vascular congestion.   Electronically Signed   By: Rolm Baptise M.D.   On: 07/22/2015 18:48   Ct Angio Chest Pe W/cm &/or Wo Cm  07/28/2015   CLINICAL DATA:  Shortness breath for 2 weeks. Personal history of smoking. The patient quit smoking 15 years ago.  EXAM: CT ANGIOGRAPHY CHEST WITH CONTRAST  TECHNIQUE: Multidetector CT imaging of the chest was performed using the standard  protocol during bolus administration of intravenous contrast. Multiplanar CT image reconstructions and MIPs were obtained to evaluate the vascular anatomy.  CONTRAST:  147m OMNIPAQUE IOHEXOL 350 MG/ML SOLN  COMPARISON:  Two-view chest x-ray 07/22/2015.  FINDINGS: Pulmonary arterial opacification is satisfactory. The study is mildly degraded by patient breathing motion. No focal filling defects are evident to suggest pulmonary emboli.  The heart is mildly enlarged. Coronary artery calcifications are present. There is no  significant pericardial effusion.  A precarinal lymph node measures 1.2 cm in short axis. Subcentimeter prevascular lymph nodes are present. No other pathologically enlarged nodes are present. No significant axillary nodes are present.  Small moderate bilateral pleural effusions are present. There is some associated atelectasis.  Diffuse ground-glass attenuation is present throughout both lungs, likely representing edema. No significant airspace consolidation is present.  The bone windows demonstrate exaggerated kyphosis in the lower thoracic spine. No focal lytic or blastic lesions are present.  Review of the MIP images confirms the above findings.  IMPRESSION: 1. No evidence for pulmonary embolus. 2. Cardiomegaly, interstitial edema, and bilateral pleural effusions are compatible with congestive heart failure. 3. No focal airspace opacification other than dependent atelectasis. 4. Enlarged precarinal lymph node. Other smaller mediastinal nodes are present as well. These are likely reactive in the setting of congestive heart failure. These results will be called to the ordering clinician or representative by the Radiologist Assistant, and communication documented in the PACS or zVision Dashboard.   Electronically Signed   By: San Morelle M.D.   On: 07/28/2015 11:15   Ct Chest High Resolution  08/07/2015   CLINICAL DATA:  80 year old female with history of diastolic congestive heart  failure. Dyspnea on exertion. Weight gain. Orthopnea new increasing edema. History of asbestos exposure.  EXAM: CT CHEST WITHOUT CONTRAST  TECHNIQUE: Multidetector CT imaging of the chest was performed following the standard protocol without intravenous contrast. High resolution imaging of the lungs, as well as inspiratory and expiratory imaging, was performed.  COMPARISON:  Chest CT 07/28/2015.  FINDINGS: Mediastinum/Lymph Nodes: Heart size is mildly enlarged. There is no significant pericardial fluid, thickening or pericardial calcification. There is atherosclerosis of the thoracic aorta, the great vessels of the mediastinum and the coronary arteries, including calcified atherosclerotic plaque in the left anterior descending and right coronary arteries. Calcifications of the mitral annulus. Calcifications of the aortic valve. Multiple prominent borderline enlarged and mildly enlarged mediastinal lymph nodes, measuring up to 12 mm in short axis in the low right paratracheal station (unchanged). Esophagus is unremarkable in appearance. No axillary lymphadenopathy.  Lungs/Pleura: Compared to the prior examination from 07/28/2015 there are multiple nodular and mass-like areas of peribronchovascular airspace consolidation now noted in the lower lobes of the lungs bilaterally, presumably infectious or inflammatory in etiology given their rapid development. There are additional patchy areas of ground-glass attenuation scattered throughout the lungs bilaterally, most confluent in the apex of the right upper lobe. Background of mild interlobular septal thickening, suggestive of a background of mild interstitial pulmonary edema. Previously noted small bilateral pleural effusions have nearly completely resolved (trace bilateral pleural effusions are noted on today's examination). Evaluation of high-resolution images is limited given the acute findings on today's examination. There are some areas of mild subpleural  reticulation, however, this could simply be related to underlying edema. No calcified pleural plaques. Inspiratory and expiratory imaging demonstrates extensive air trapping, indicative of small airways disease.  Upper Abdomen: Status post cholecystectomy. Slightly nodular contour of the liver, could indicate early changes of cirrhosis. Atherosclerosis.  Musculoskeletal/Soft Tissues: There are no aggressive appearing lytic or blastic lesions noted in the visualized portions of the skeleton.  IMPRESSION: 1. Assessment for underlying interstitial lung disease is significantly limited on today's examination secondary to multiple acute findings, as discussed above. No overt changes suggestive of interstitial lung disease are confidently identified. If there continues to be clinical concern for interstitial lung disease, follow-up outpatient evaluation with high-resolution chest CT in 6-12 months  would be recommended. 2. Interval development of multifocal peribronchovascular nodular and mass-like appearing airspace consolidation most evident in the lower lobes of the lungs bilaterally (left greater than right), presumably infectious or inflammatory in etiology. 3. Extensive air trapping, indicative of small airways disease. 4. No calcified pleural plaques to suggest asbestos related pleural disease. 5. Mild diffuse interlobular septal thickening, likely to reflect a background of mild interstitial pulmonary edema. Small bilateral pleural effusions have nearly completely resolved compared to the prior examination. 6. Mild cardiomegaly. 7. Atherosclerosis, including 2 vessel coronary artery disease. Assessment for potential risk factor modification, dietary therapy or pharmacologic therapy may be warranted, if clinically indicated. 8. Additional incidental findings, as above.   Electronically Signed   By: Vinnie Langton M.D.   On: 08/07/2015 12:29   Dg Chest Port 1 View  08/14/2015   CLINICAL DATA:  Pneumonia .  EXAM:  PORTABLE CHEST - 1 VIEW  COMPARISON:  08/13/2015.  FINDINGS: Mediastinum and hilar structures are normal. Stable cardiomegaly with pulmonary vascular prominence and bilateral interstitial prominence with small left pleural effusion. Findings consistent with congestive heart failure. Slight worsening from prior exam. No pneumothorax. Surgical clips left neck.  IMPRESSION: Cardiomegaly with pulmonary vascular prominence and bilateral interstitial prominence with small left pleural effusion. Findings consistent with congestive heart failure. Slight worsening from prior exam.   Electronically Signed   By: Marcello Moores  Register   On: 08/14/2015 07:29   Dg Chest Port 1 View  08/13/2015   CLINICAL DATA:  Patient with history pleural effusion.  CHF.  EXAM: PORTABLE CHEST - 1 VIEW  COMPARISON:  Chest radiograph 08/07/2015  FINDINGS: Monitoring leads overlie the patient. Stable enlarged cardiac and mediastinal contours. Interval improvement in right-greater-than-left airspace opacities. Probable small bilateral pleural effusions.  IMPRESSION: Interval improvement in bilateral airspace opacities, likely secondary to improving edema or infection. Continued radiographic follow-up is recommended to ensure complete resolution.   Electronically Signed   By: Lovey Newcomer M.D.   On: 08/13/2015 10:01   Dg Chest Port 1 View  08/07/2015   CLINICAL DATA:  Shortness of Breath  EXAM: PORTABLE CHEST - 1 VIEW  COMPARISON:  08/04/2015  FINDINGS: Cardiac shadow remains enlarged. Prominent vascular congestion is again right greater than left with interstitial changes changes have progressed slightly in the interval from the prior exam although some of this may be technically related. Aortic calcifications are again seen. The bony structures are stable.  IMPRESSION: Changes consistent with CHF which have worsened slightly in the interval from the prior exam.   Electronically Signed   By: Inez Catalina M.D.   On: 08/07/2015 07:39   Dg Chest Port  1 View  07/30/2015   CLINICAL DATA:  Patient with shortness of breath since last Saturday. Recent diagnosis congestive heart failure.  EXAM: PORTABLE CHEST - 1 VIEW  COMPARISON:  Chest CT 07/28/2015; chest radiograph 07/22/2015  FINDINGS: Monitoring leads overlie the patient. Cardiomegaly. Moderate left and small right pleural effusions. Bilateral perihilar interstitial pulmonary opacities. No pneumothorax.  IMPRESSION: Cardiomegaly with increasing interstitial opacities concerning for pulmonary edema. Moderate left and small right pleural effusions.   Electronically Signed   By: Lovey Newcomer M.D.   On: 07/30/2015 21:18    Thurnell Lose, MD  [facility] Pager:[phone]  If 7PM-7AM, please contact night-coverage [email or web address] Password [email or web address] 08/15/2015, 11:17 AM   LOS: 16 days

## 2015-08-15 NOTE — Progress Notes (Signed)
Subjective: Interval History: breathing continues to be better. RHC yesterday showed increased right and left sided filling pressure, Pulm HTN. Lasix was increased to 30m tid. No complaints today.   Objective: Vital signs in last 24 hours: Temp:  [97.5 F (36.4 C)-97.9 F (36.6 C)] 97.7 F (36.5 C) (08/23 0609) Pulse Rate:  [45-99] 50 (08/23 0609) Resp:  [12-25] 18 (08/23 0609) BP: (133-178)/(59-82) 136/65 mmHg (08/23 0609) SpO2:  [91 %-96 %] 95 % (08/23 0609) Weight:  [176 lb 8 oz (80.06 kg)] 176 lb 8 oz (80.06 kg) (08/23 0544) Weight change: -2 lb 15.3 oz (-1.34 kg)  Intake/Output from previous day: 08/22 0701 - 08/23 0700 In: 240 [P.O.:240] Out: 1350 [Urine:1350] Intake/Output this shift:    General: sitting up, pleasant HEENT: Carmen/at, eomi, perrla Lungs: ctab. Heart: rrr, no m/r/g. No JVD. Abd: soft, nt, nd Ext: no edema.   Lab Results:  Recent Labs  08/12/15 1211 08/14/15 0338  WBC 6.9 4.8  HGB 10.7* 10.0*  HCT 32.4* 30.8*  PLT 238 185   BMET:   Recent Labs  08/14/15 0338 08/15/15 0557  NA 131* 134*  K 3.8 4.1  CL 91* 93*  CO2 32 30  GLUCOSE 127* 117*  BUN 60* 48*  CREATININE 1.18* 1.04*  CALCIUM 9.1 9.3   No results for input(s): PTH in the last 72 hours. Iron Studies: No results for input(s): IRON, TIBC, TRANSFERRIN, FERRITIN in the last 72 hours. Studies/Results: Dg Chest Port 1 View  08/14/2015   CLINICAL DATA:  Pneumonia .  EXAM: PORTABLE CHEST - 1 VIEW  COMPARISON:  08/13/2015.  FINDINGS: Mediastinum and hilar structures are normal. Stable cardiomegaly with pulmonary vascular prominence and bilateral interstitial prominence with small left pleural effusion. Findings consistent with congestive heart failure. Slight worsening from prior exam. No pneumothorax. Surgical clips left neck.  IMPRESSION: Cardiomegaly with pulmonary vascular prominence and bilateral interstitial prominence with small left pleural effusion. Findings consistent with  congestive heart failure. Slight worsening from prior exam.   Electronically Signed   By: TMarcello Moores Register   On: 08/14/2015 07:29    I have reviewed the patient's current medications.  Assessment/Plan: 80yo female with diastolic CHF, morbitz II AV block and junctional brady, here with hypoxic resp failure and AKI.  1. AKI - Likely 2/2 to hemodynamically mediated etiology (BP dropped to 80's) + Ace Inhibitor (last dose 8/17) + CHF. Baseline Crt 0.86. Was initially high likely from cardiorenal, which improved, but then went up from 1.07 >> 2.23 >> 3.85. This around the same time when her BP dropped to as low as 83/30 on 8/18, also had last dose of Lisinopril on 8/17. Crt now back to baseline after diuresis. RHC consistent with volume overload.  This is not Pulmonary-renal etiology given no protein in urine and improvement with lasix.  2. Hyponatremia -Resolved with lasix.  likely hypervolemic hyponatremia as sodium 134>>126 after stopping lasix when her weight 170 >>179.   3. Acute hypoxic resp failure - improving - likely combination of volume overload and possible inflammatory lung disease. Feels that SOB is better with steroid (al though only got it for 1 day before improvement).  ESR 92, RA factor 57.8 (could be nonspecific - no RA symptoms). RHC consistent with volume overload.  Plan:  1. Continue further diuresis with lasix 852mIV today. Cards has increased it to 8059mID. Home dose was 34m34mce a day, we suspect she will need a higher dose discharge.  2. Agree with continuation  of prednisone.  3. Avoid ACE I and other nephrotoxins.  4. We will sign off as kidney function is at baseline and HypoNa+ has resolved.     LOS: 16 days   Ahmed, Tasrif 08/15/2015,7:16 AM  I have seen and examined this patient and agree with plan as outlined by Dr. Genene Churn.  Improving with diuresis.  Cardiology managing diuretics and nothing further to add.  No need to follow up with our practice as this is  related to CHF.  Will sign off.  Please call with questions or concerns. Keeva Reisen A,MD 08/15/2015 12:04 PM

## 2015-08-15 NOTE — Progress Notes (Signed)
PCCM PROGRESS NOTE  ADMISSION DATE: 07/30/2015 CONSULT DATE: 08/07/2015 REFERRING PROVIDER: TRIAD  CC: Short of breath  SUBJECTIVE: Anxious to go home.  OBJECTIVE: Temp:  [97.5 F (36.4 C)-97.9 F (36.6 C)] 97.7 F (36.5 C) (08/23 0609) Pulse Rate:  [50-68] 50 (08/23 0609) Resp:  [14-18] 18 (08/23 0609) BP: (133-150)/(59-71) 147/71 mmHg (08/23 0700) SpO2:  [92 %-96 %] 96 % (08/23 0833) Weight:  [176 lb 8 oz (80.06 kg)] 176 lb 8 oz (80.06 kg) (08/23 0544)   General: no distress HEENT: no sinus tenderness Cardiac: regular Chest: no wheeze Abd: soft, non tender Ext: no edema Neuro: normal strength Skin: no rashes   CMP Latest Ref Rng 08/15/2015 08/14/2015 08/13/2015  Glucose 65 - 99 mg/dL 117(H) 127(H) 147(H)  BUN 6 - 20 mg/dL 48(H) 60(H) 65(H)  Creatinine 0.44 - 1.00 mg/dL 1.04(H) 1.18(H) 1.34(H)  Sodium 135 - 145 mmol/L 134(L) 131(L) 128(L)  Potassium 3.5 - 5.1 mmol/L 4.1 3.8 4.6  Chloride 101 - 111 mmol/L 93(L) 91(L) 91(L)  CO2 22 - 32 mmol/L 30 32 27  Calcium 8.9 - 10.3 mg/dL 9.3 9.1 9.2  Total Protein 6.0-8.3 g/dL - - -  Total Bilirubin 0.3-1.2 mg/dL - - -  Alkaline Phos 39-117 units/L - - -  AST 0-37 units/L - - -  ALT 0-35 units/L - - -    CBC Latest Ref Rng 08/14/2015 08/12/2015 08/07/2015  WBC 4.0 - 10.5 K/uL 4.8 6.9 7.0  Hemoglobin 12.0 - 15.0 g/dL 10.0(L) 10.7(L) 11.1(L)  Hematocrit 36.0 - 46.0 % 30.8(L) 32.4(L) 34.1(L)  Platelets 150 - 400 K/uL 185 238 258    BNP (last 3 results)  Recent Labs  07/22/15 1715 07/30/15 2050 08/06/15 1703  BNP 394.9* 346.3* 189.9*    Lab Results  Component Value Date   ESRSEDRATE 92* 08/07/2015    Dg Chest Port 1 View  08/14/2015   CLINICAL DATA:  Pneumonia .  EXAM: PORTABLE CHEST - 1 VIEW  COMPARISON:  08/13/2015.  FINDINGS: Mediastinum and hilar structures are normal. Stable cardiomegaly with pulmonary vascular prominence and bilateral interstitial prominence with small left pleural effusion. Findings consistent  with congestive heart failure. Slight worsening from prior exam. No pneumothorax. Surgical clips left neck.  IMPRESSION: Cardiomegaly with pulmonary vascular prominence and bilateral interstitial prominence with small left pleural effusion. Findings consistent with congestive heart failure. Slight worsening from prior exam.   Electronically Signed   By: Marcello Moores  Register   On: 08/14/2015 07:29   STUDIES: 8/14 Doppler legs b/l >> no DVT 8/15 Spirometry >> FEV1 1.18 (56%), FEV1% 69 8/15 HR CT chest >> motion artifact, multifocal nodular/mass like ASD Lt > Rt, air trapping, interlobular septal thickening, small b/l effusions 8/15 Labs >> RF 57.8, ANA negative 8/16 Echo bubble study >> EF 65 to 70%, mod/severe LA dilation, PAS 46 mmHg, no shunt 8/17 Bronchoscopy 8/22 Rt heart cardiac cath >> RA 20, RV 72/20, PA 72/27/39, PCWP 29 with prominent V wave, CI 3.06, PVR 1.7  EVENTS: 8/07 Admit 8/08 Cardiology consulted 8/15 Add spiriva 8/19 Renal consulted 8/20 Add solumedrol 8/23 changed to prednisone  DISCUSSION: 80 yo female former smoker presented with dyspnea, edema with initial concern for CHF exacerbation.  She only had partial improvement with diuresis.  CT chest showed b/l nodular/mass like ASD, elevated ESR, mildly elevated RF, and she did have clinical improvement after starting steroids.  Rt heart cath shows volume overload also.  ASSESSMENT/PLAN:  Acute hypoxic respiratory failure with b/l nodular pulmonary infiltrates with  increased ESR >> improved with addition of steroids. Plan: - continue prednisone 40 mg daily until pulmonary follow up visit - she has home oxygen set up  COPD. Plan: - continue spiriva  Acute on chronic diastolic CHF. Hx of HTN, CAD, Mobitz type II, HLD. Plan: - continue diuresis per primary team and cardiology  AKI, hyponatremia. Hx of hypothyroidism. Plan: - per primary team   She has f/u with Dr. Ashok Cordia in pulmonary office on 08/18/15.  PCCM can  be available as needed while she remains in hospital >> call if there are any questions.  Chesley Mires, MD [facility] Pulmonary/Critical Care 08/15/2015, 9:46 AM Pager:  [phone] After 3pm call: [phone]

## 2015-08-16 LAB — BASIC METABOLIC PANEL
ANION GAP: 16 — AB (ref 5–15)
BUN: 52 mg/dL — ABNORMAL HIGH (ref 6–20)
CALCIUM: 9.6 mg/dL (ref 8.9–10.3)
CO2: 32 mmol/L (ref 22–32)
Chloride: 87 mmol/L — ABNORMAL LOW (ref 101–111)
Creatinine, Ser: 1.28 mg/dL — ABNORMAL HIGH (ref 0.44–1.00)
GFR calc Af Amer: 46 mL/min — ABNORMAL LOW (ref >60)
GFR, EST NON AFRICAN AMERICAN: 39 mL/min — AB (ref >60)
GLUCOSE: 113 mg/dL — AB (ref 65–99)
POTASSIUM: 3.4 mmol/L — AB (ref 3.5–5.1)
SODIUM: 135 mmol/L (ref 135–145)

## 2015-08-16 MED ORDER — FUROSEMIDE 40 MG PO TABS: 40.0000 mg | ORAL_TABLET | Freq: Two times a day (BID) | ORAL | Status: DC

## 2015-08-16 MED ORDER — FUROSEMIDE 40 MG PO TABS
40.0000 mg | ORAL_TABLET | Freq: Two times a day (BID) | ORAL | Status: DC
  Administered 2015-08-16: 40 mg via ORAL
  Filled 2015-08-16: qty 1

## 2015-08-16 MED ORDER — AMLODIPINE BESYLATE 10 MG PO TABS: 10.0000 mg | ORAL_TABLET | Freq: Every day | ORAL | Status: DC

## 2015-08-16 MED ORDER — CARVEDILOL 3.125 MG PO TABS: 3.1250 mg | ORAL_TABLET | Freq: Two times a day (BID) | ORAL | Status: DC

## 2015-08-16 MED ORDER — PREDNISONE 20 MG PO TABS: 40.0000 mg | ORAL_TABLET | Freq: Every day | ORAL | Status: DC

## 2015-08-16 MED ORDER — POTASSIUM CHLORIDE CRYS ER 20 MEQ PO TBCR
40.0000 meq | EXTENDED_RELEASE_TABLET | ORAL | Status: AC
  Administered 2015-08-16: 40 meq via ORAL
  Filled 2015-08-16: qty 2

## 2015-08-16 NOTE — Discharge Instructions (Signed)
Follow with Primary MD Garnet Koyanagi, DO in 7 days   Get CBC, CMP, 2 view Chest X ray checked  by Primary MD next visit.    Activity: As tolerated with Full fall precautions use walker/cane & assistance as needed   Disposition Home     Diet: Heart Healthy Check your Weight same time everyday, if you gain over 2 pounds, or you develop in leg swelling, experience more shortness of breath or chest pain, call your Primary MD immediately. Follow Cardiac Low Salt Diet and 1.5 lit/day fluid restriction.   On your next visit with your primary care physician please Get Medicines reviewed and adjusted.   Please request your Prim.MD to go over all Hospital Tests and Procedure/Radiological results at the follow up, please get all Hospital records sent to your Prim MD by signing hospital release before you go home.   If you experience worsening of your admission symptoms, develop shortness of breath, life threatening emergency, suicidal or homicidal thoughts you must seek medical attention immediately by calling 911 or calling your MD immediately  if symptoms less severe.  You Must read complete instructions/literature along with all the possible adverse reactions/side effects for all the Medicines you take and that have been prescribed to you. Take any new Medicines after you have completely understood and accpet all the possible adverse reactions/side effects.   Do not drive, operating heavy machinery, perform activities at heights, swimming or participation in water activities or provide baby sitting services if your were admitted for syncope or siezures until you have seen by Primary MD or a Neurologist and advised to do so again.  Do not drive when taking Pain medications.    Do not take more than prescribed Pain, Sleep and Anxiety Medications  Special Instructions: If you have smoked or chewed Tobacco  in the last 2 yrs please stop smoking, stop any regular Alcohol  and or any Recreational  drug use.  Wear Seat belts while driving.   Please note  You were cared for by a hospitalist during your hospital stay. If you have any questions about your discharge medications or the care you received while you were in the hospital after you are discharged, you can call the unit and asked to speak with the hospitalist on call if the hospitalist that took care of you is not available. Once you are discharged, your primary care physician will handle any further medical issues. Please note that NO REFILLS for any discharge medications will be authorized once you are discharged, as it is imperative that you return to your primary care physician (or establish a relationship with a primary care physician if you do not have one) for your aftercare needs so that they can reassess your need for medications and monitor your lab values.

## 2015-08-16 NOTE — Care Management Important Message (Signed)
Important Message  Patient Details  Name: Colleen [name] MRN: [id number] Date of Birth: 12/11/1935   Medicare Important Message Given:  Yes-second notification given    Pricilla Handler 08/16/2015, 12:05 PM

## 2015-08-16 NOTE — Discharge Summary (Signed)
Colleen [name], is a 80 y.o. female  DOB 01/20/1935  MRN [id number].  Admission date:  07/30/2015  Admitting Physician  Theressa Millard, MD  Discharge Date:  08/16/2015   Primary MD  Garnet Koyanagi, DO  Recommendations for primary care physician for things to follow:   Monitor CBC, BMP, Weight and diuretic use closely   Admission Diagnosis  SOB (shortness of breath) [R06.02] Acute kidney injury (nontraumatic) [N17.9] Acute exacerbation of CHF (congestive heart failure) [I50.9] Elevated troponin I level [R79.89]   Discharge Diagnosis  SOB (shortness of breath) [R06.02] Acute kidney injury (nontraumatic) [N17.9] Acute exacerbation of CHF (congestive heart failure) [I50.9] Elevated troponin I level [R79.89]     Principal Problem:   Acute diastolic CHF (congestive heart failure) Active Problems:   Hypothyroidism   Hyperlipidemia   Essential hypertension   Acute respiratory failure with hypoxia   AKI (acute kidney injury)   Sinus bradycardia   Hyponatremia   Acute kidney injury (nontraumatic)   Acute on chronic respiratory failure   Pulmonary infiltrate      Past Medical History  Diagnosis Date  . Hyperlipidemia   . Hypertension   . Hypothyroidism   . TIA (transient ischemic attack) 2001  . Carotid artery disease   . Shortness of breath   . CHF (congestive heart failure)     Past Surgical History  Procedure Laterality Date  . Cholecystectomy    . Abdominal hysterectomy  1975    TAH,BSO  . Carotid endarterectomy  2001    left  . Doppler echocardiography  02/13/2010    EF>55%  . Doppler echocardiography  09/29/2007  . Nm myoview ltd  02/13/2010  . Carotid duplex  11/24/2012  . Carotid duplex  11/01/2010  . Renal duplex  02/13/2010  . Video bronchoscopy Bilateral 08/09/2015    Procedure:  VIDEO BRONCHOSCOPY WITHOUT FLUORO;  Surgeon: Collene Gobble, MD;  Location: [facility];  Service: Cardiopulmonary;  Laterality: Bilateral;  . Cardiac catheterization N/A 08/14/2015    Procedure: Right Heart Cath;  Surgeon: Larey Dresser, MD;  Location: [facility];  Service: Cardiovascular;  Laterality: N/A;       HPI  from the history and physical done on the day of admission:    80 year old female with history of diastolic heart failure admitted on 8/7 with worsening shortness of breath. She was initially felt to have acute diastolic heart failure, and was started on IV Lasix. In spite of aggressive diuresis, and with her being below her dry weight-she continued to have shortness of breath, x-ray during this time showed worsening congestive heart failure. She subsequently underwent a high-resolution CT of the chest that showed new minus-like appearing airspace consolidation-highly suggestive of a inflammatory process/autoimmune process. PCCM was consulted, bronchoscopy was attempted-could not be completed because of worsening bradycardia. Serological workup so far is positive for a significantly elevated rheumatoid factor. PCCM is contemplating starting patient on steroids once further serological tests are back. Unfortunately latter half of the hospital course has been complicated  by development of acute renal failure, likely hemodynamically mediated due to borderline hypotension. Please see below for further details     Hospital Course:     1. Acute hypoxic respiratory failure: Due to combination of acute on chronic diastolic CHF along with nonspecific inflammatory change in the lungs   A. Acute on chronic diastolic CHF. Has been adequately diuresed, cardiology on board, echocardiogram shows EF of 65% without any wall motion abnormalities, currently not on diuretic, cardiology on board and they are contemplating right heart cath.  She underwent a right heart catheterization by  cardiology on 08/14/2015 suggestive of pulmonary hypertension likely due to underlying diastolic dysfunction. Cleared by cards for DC today.  B. Nonspecific lung infiltrates on CT angiogram and CT chest high resolution. No PE on CT angiogram chest and on lower extremity duplex, pulmonary on board, she is still short of breath and hypoxic, she is currently on Solu-Medrol since 08/12/2015 with good clinical improvement per pulmonary switched to oral prednisone on 08/15/2015 thereafter outpatient pulmonary follow-up. Pulmonary followed the patient as well. Bronchoscopy could not be done earlier due to severe bradycardia. Will get home oxygen . Cleared for DC with outpt follow up by PCCM. Her rheumatoid factor is high, ANA negative, anti-CCP antibody pending, pro-calcitonin unremarkable, ESR 92.      ARF with hyponatremia: Currently being gently diuresed with Lasix by nephrology and cardiology. Renal function stable now.  Sinus bradycardia: Heart rate is stable in the 50s, continue to monitor off beta blockers. History of Mobitz type II AV block as well as junctional bradycardia which is likely not contributing to her SOB per cardiology. Cardiology on board who recommended no further treatment. She used to be on beta blocker but has been off for the last 2 weeks. TSH stable.  Hypertension: BP is stable continue to monitor closely stopped midodrine as she is now on prednisone with stable blood pressures. Will add Norvasc.  Hypothyroidism: Continue with Synthroid, Stable TSH .  Hyperlipidemia: Resumed niacin on discharge    Discharge Condition: Stable  Follow UP  Follow-up Information    Follow up with Garnet Koyanagi, DO On 08/11/2015.   Specialty:  Family Medicine   Why:  @ 1:30 PM Mackie Pai, PA for a hospital follow-up. Confirmed appointment with Oran Rein information:   [address] [place] [address] [phone]       Follow up with [facility], NP  On 08/17/2015.   Specialties:  Cardiology, Radiology   Why:  @ 9:45am with some lab work   Contact information:   [address] [place] [address] [phone]       Follow up with Tera Partridge, MD On 08/31/2015.   Specialty:  Pulmonary Disease   Why:  hospital follow up with Pulmonologist-appt at 3 PM   Contact information:   [address] [place] [address] [phone]       Follow up with Loralie Champagne, MD On 08/24/2015.   Specialty:  Cardiology   Why:  at [facility] information:   [address] [place] [address] [phone]        Consults obtained - cardiology, pulmonary/intensive care and nephrology  Diet and Activity recommendation: See Discharge Instructions below  Discharge Instructions           Discharge Instructions    (HEART FAILURE PATIENTS) Call MD:  Anytime you have any  of the following symptoms: 1) 3 pound weight gain in 24 hours or 5 pounds in 1 week 2) shortness of breath, with or without a dry hacking cough 3) swelling in the hands, feet or stomach 4) if you have to sleep on extra pillows at night in order to breathe.    Complete by:  As directed      Call MD for:  difficulty breathing, headache or visual disturbances    Complete by:  As directed      Diet - low sodium heart healthy    Complete by:  As directed      Discharge instructions    Complete by:  As directed   Follow with Primary MD Garnet Koyanagi, DO in 7 days   Get CBC, CMP, 2 view Chest X ray checked  by Primary MD next visit.    Activity: As tolerated with Full fall precautions use walker/cane & assistance as needed   Disposition Home     Diet: Heart Healthy Check your Weight same time everyday, if you gain over 2 pounds, or you develop in leg swelling, experience more shortness of breath or chest pain, call your Primary MD immediately. Follow Cardiac Low Salt Diet and 1.5 lit/day fluid restriction.   On your  next visit with your primary care physician please Get Medicines reviewed and adjusted.   Please request your Prim.MD to go over all Hospital Tests and Procedure/Radiological results at the follow up, please get all Hospital records sent to your Prim MD by signing hospital release before you go home.   If you experience worsening of your admission symptoms, develop shortness of breath, life threatening emergency, suicidal or homicidal thoughts you must seek medical attention immediately by calling 911 or calling your MD immediately  if symptoms less severe.  You Must read complete instructions/literature along with all the possible adverse reactions/side effects for all the Medicines you take and that have been prescribed to you. Take any new Medicines after you have completely understood and accpet all the possible adverse reactions/side effects.   Do not drive, operating heavy machinery, perform activities at heights, swimming or participation in water activities or provide baby sitting services if your were admitted for syncope or siezures until you have seen by Primary MD or a Neurologist and advised to do so again.  Do not drive when taking Pain medications.    Do not take more than prescribed Pain, Sleep and Anxiety Medications  Special Instructions: If you have smoked or chewed Tobacco  in the last 2 yrs please stop smoking, stop any regular Alcohol  and or any Recreational drug use.  Wear Seat belts while driving.   Please note  You were cared for by a hospitalist during your hospital stay. If you have any questions about your discharge medications or the care you received while you were in the hospital after you are discharged, you can call the unit and asked to speak with the hospitalist on call if the hospitalist that took care of you is not available. Once you are discharged, your primary care physician will handle any further medical issues. Please note that NO REFILLS for any  discharge medications will be authorized once you are discharged, as it is imperative that you return to your primary care physician (or establish a relationship with a primary care physician if you do not have one) for your aftercare needs so that they can reassess your need for medications and monitor your lab values.  Increase activity slowly    Complete by:  As directed      Increase activity slowly    Complete by:  As directed              Discharge Medications       Medication List    STOP taking these medications        lisinopril-hydrochlorothiazide 20-12.5 MG per tablet  Commonly known as:  PRINZIDE,ZESTORETIC     Potassium 99 MG Tabs      TAKE these medications        albuterol 108 (90 BASE) MCG/ACT inhaler  Commonly known as:  PROVENTIL HFA;VENTOLIN HFA  Inhale 2 puffs into the lungs every 6 (six) hours as needed for wheezing or shortness of breath.     amLODipine 10 MG tablet  Commonly known as:  NORVASC  Take 1 tablet (10 mg total) by mouth daily.     Cholecalciferol 1000 UNITS tablet  Take 1,000 Units by mouth every evening.     furosemide 40 MG tablet  Commonly known as:  LASIX  Take 1 tablet (40 mg total) by mouth 2 (two) times daily.     GNP CALCIUM 1500 (600 CA) MG Tabs  Generic drug:  Calcium Carbonate  Take 1,500 mg by mouth every evening.     levothyroxine 100 MCG tablet  Commonly known as:  SYNTHROID, LEVOTHROID  Take 100 mcg by mouth daily before breakfast.     magnesium 30 MG tablet  Take 30 mg by mouth every evening.     niacin 500 MG tablet  Take 500 mg by mouth every evening. otc     potassium chloride SA 20 MEQ tablet  Commonly known as:  K-DUR,KLOR-CON  Take 1 tablet (20 mEq total) by mouth 2 (two) times daily.     predniSONE 20 MG tablet  Commonly known as:  DELTASONE  Take 2 tablets (40 mg total) by mouth daily with breakfast.     tiotropium 18 MCG inhalation capsule  Commonly known as:  SPIRIVA  Place 1 capsule (18  mcg total) into inhaler and inhale daily.     Turmeric Curcumin Caps  Take 1 capsule by mouth every evening.     vitamin B-12 500 MCG tablet  Commonly known as:  CYANOCOBALAMIN  Take 500 mcg by mouth every evening.        Major procedures and Radiology Reports - PLEASE review detailed and final reports for all details, in brief -    Procedures:  Right heart catheterization by cardiology on 08/14/2015.   TTE  - Left ventricle: The cavity size was normal. Wall thickness wasnormal. Systolic function was vigorous. The estimated ejectionfraction was in the range of 65% to 70%. Wall motion was normal;there were no regional wall motion abnormalities. - Mitral valve: Mildly calcified annulus. There was no significantregurgitation. - Left atrium: The atrium was moderately to severely dilated. - Right ventricle: The cavity size was mildly dilated. Systolicfunction was normal. - Right atrium: Prominent Eustachian valve. - Atrial septum: No defect or patent foramen ovale was identified.Echo contrast study showed no right-to-left atrial level shunt, at baseline or with provocation. - Tricuspid valve: Peak RV-RA gradient (S): 38 mm Hg. - Pulmonary arteries: PA peak pressure: 46 mm Hg (S). - Systemic veins: IVC measured 2.3 cm with > 50% respirophasicvariation, suggesting RA pressure 8 mmHg.  Impressions: Limited echo for bubble study (negative).   Dg Chest 2 View  08/04/2015   CLINICAL DATA:  Shortness of breath,  low oxygen saturation  EXAM: CHEST  2 VIEW  COMPARISON:  07/30/2015  FINDINGS: There is stable cardiomegaly. There are trace bilateral pleural effusions. There is bilateral diffuse interstitial thickening. There is no pneumothorax. There is no acute osseous abnormality.  IMPRESSION: Findings most consistent with mild CHF.   Electronically Signed   By: Kathreen Devoid   On: 08/04/2015 12:36   Dg Chest 2 View  07/22/2015   CLINICAL DATA:  Shortness of breath, dry cough.  EXAM:  CHEST  2 VIEW  COMPARISON:  02/20/2015  FINDINGS: There is mild cardiomegaly and vascular congestion. No overt edema. No confluent opacities or effusions. No acute bony abnormality.  IMPRESSION: Cardiomegaly, vascular congestion.   Electronically Signed   By: Rolm Baptise M.D.   On: 07/22/2015 18:48   Ct Angio Chest Pe W/cm &/or Wo Cm  07/28/2015   CLINICAL DATA:  Shortness breath for 2 weeks. Personal history of smoking. The patient quit smoking 15 years ago.  EXAM: CT ANGIOGRAPHY CHEST WITH CONTRAST  TECHNIQUE: Multidetector CT imaging of the chest was performed using the standard protocol during bolus administration of intravenous contrast. Multiplanar CT image reconstructions and MIPs were obtained to evaluate the vascular anatomy.  CONTRAST:  132m OMNIPAQUE IOHEXOL 350 MG/ML SOLN  COMPARISON:  Two-view chest x-ray 07/22/2015.  FINDINGS: Pulmonary arterial opacification is satisfactory. The study is mildly degraded by patient breathing motion. No focal filling defects are evident to suggest pulmonary emboli.  The heart is mildly enlarged. Coronary artery calcifications are present. There is no significant pericardial effusion.  A precarinal lymph node measures 1.2 cm in short axis. Subcentimeter prevascular lymph nodes are present. No other pathologically enlarged nodes are present. No significant axillary nodes are present.  Small moderate bilateral pleural effusions are present. There is some associated atelectasis.  Diffuse ground-glass attenuation is present throughout both lungs, likely representing edema. No significant airspace consolidation is present.  The bone windows demonstrate exaggerated kyphosis in the lower thoracic spine. No focal lytic or blastic lesions are present.  Review of the MIP images confirms the above findings.  IMPRESSION: 1. No evidence for pulmonary embolus. 2. Cardiomegaly, interstitial edema, and bilateral pleural effusions are compatible with congestive heart failure. 3. No  focal airspace opacification other than dependent atelectasis. 4. Enlarged precarinal lymph node. Other smaller mediastinal nodes are present as well. These are likely reactive in the setting of congestive heart failure. These results will be called to the ordering clinician or representative by the Radiologist Assistant, and communication documented in the PACS or zVision Dashboard.   Electronically Signed   By: CSan MorelleM.D.   On: 07/28/2015 11:15   Ct Chest High Resolution  08/07/2015   CLINICAL DATA:  80year old female with history of diastolic congestive heart failure. Dyspnea on exertion. Weight gain. Orthopnea new increasing edema. History of asbestos exposure.  EXAM: CT CHEST WITHOUT CONTRAST  TECHNIQUE: Multidetector CT imaging of the chest was performed following the standard protocol without intravenous contrast. High resolution imaging of the lungs, as well as inspiratory and expiratory imaging, was performed.  COMPARISON:  Chest CT 07/28/2015.  FINDINGS: Mediastinum/Lymph Nodes: Heart size is mildly enlarged. There is no significant pericardial fluid, thickening or pericardial calcification. There is atherosclerosis of the thoracic aorta, the great vessels of the mediastinum and the coronary arteries, including calcified atherosclerotic plaque in the left anterior descending and right coronary arteries. Calcifications of the mitral annulus. Calcifications of the aortic valve. Multiple prominent borderline enlarged and mildly enlarged mediastinal  lymph nodes, measuring up to 12 mm in short axis in the low right paratracheal station (unchanged). Esophagus is unremarkable in appearance. No axillary lymphadenopathy.  Lungs/Pleura: Compared to the prior examination from 07/28/2015 there are multiple nodular and mass-like areas of peribronchovascular airspace consolidation now noted in the lower lobes of the lungs bilaterally, presumably infectious or inflammatory in etiology given their rapid  development. There are additional patchy areas of ground-glass attenuation scattered throughout the lungs bilaterally, most confluent in the apex of the right upper lobe. Background of mild interlobular septal thickening, suggestive of a background of mild interstitial pulmonary edema. Previously noted small bilateral pleural effusions have nearly completely resolved (trace bilateral pleural effusions are noted on today's examination). Evaluation of high-resolution images is limited given the acute findings on today's examination. There are some areas of mild subpleural reticulation, however, this could simply be related to underlying edema. No calcified pleural plaques. Inspiratory and expiratory imaging demonstrates extensive air trapping, indicative of small airways disease.  Upper Abdomen: Status post cholecystectomy. Slightly nodular contour of the liver, could indicate early changes of cirrhosis. Atherosclerosis.  Musculoskeletal/Soft Tissues: There are no aggressive appearing lytic or blastic lesions noted in the visualized portions of the skeleton.  IMPRESSION: 1. Assessment for underlying interstitial lung disease is significantly limited on today's examination secondary to multiple acute findings, as discussed above. No overt changes suggestive of interstitial lung disease are confidently identified. If there continues to be clinical concern for interstitial lung disease, follow-up outpatient evaluation with high-resolution chest CT in 6-12 months would be recommended. 2. Interval development of multifocal peribronchovascular nodular and mass-like appearing airspace consolidation most evident in the lower lobes of the lungs bilaterally (left greater than right), presumably infectious or inflammatory in etiology. 3. Extensive air trapping, indicative of small airways disease. 4. No calcified pleural plaques to suggest asbestos related pleural disease. 5. Mild diffuse interlobular septal thickening, likely  to reflect a background of mild interstitial pulmonary edema. Small bilateral pleural effusions have nearly completely resolved compared to the prior examination. 6. Mild cardiomegaly. 7. Atherosclerosis, including 2 vessel coronary artery disease. Assessment for potential risk factor modification, dietary therapy or pharmacologic therapy may be warranted, if clinically indicated. 8. Additional incidental findings, as above.   Electronically Signed   By: Vinnie Langton M.D.   On: 08/07/2015 12:29   Dg Chest Port 1 View  08/14/2015   CLINICAL DATA:  Pneumonia .  EXAM: PORTABLE CHEST - 1 VIEW  COMPARISON:  08/13/2015.  FINDINGS: Mediastinum and hilar structures are normal. Stable cardiomegaly with pulmonary vascular prominence and bilateral interstitial prominence with small left pleural effusion. Findings consistent with congestive heart failure. Slight worsening from prior exam. No pneumothorax. Surgical clips left neck.  IMPRESSION: Cardiomegaly with pulmonary vascular prominence and bilateral interstitial prominence with small left pleural effusion. Findings consistent with congestive heart failure. Slight worsening from prior exam.   Electronically Signed   By: Marcello Moores  Register   On: 08/14/2015 07:29   Dg Chest Port 1 View  08/13/2015   CLINICAL DATA:  Patient with history pleural effusion.  CHF.  EXAM: PORTABLE CHEST - 1 VIEW  COMPARISON:  Chest radiograph 08/07/2015  FINDINGS: Monitoring leads overlie the patient. Stable enlarged cardiac and mediastinal contours. Interval improvement in right-greater-than-left airspace opacities. Probable small bilateral pleural effusions.  IMPRESSION: Interval improvement in bilateral airspace opacities, likely secondary to improving edema or infection. Continued radiographic follow-up is recommended to ensure complete resolution.   Electronically Signed   By: Lovey Newcomer  M.D.   On: 08/13/2015 10:01   Dg Chest Port 1 View  08/07/2015   CLINICAL DATA:  Shortness of  Breath  EXAM: PORTABLE CHEST - 1 VIEW  COMPARISON:  08/04/2015  FINDINGS: Cardiac shadow remains enlarged. Prominent vascular congestion is again right greater than left with interstitial changes changes have progressed slightly in the interval from the prior exam although some of this may be technically related. Aortic calcifications are again seen. The bony structures are stable.  IMPRESSION: Changes consistent with CHF which have worsened slightly in the interval from the prior exam.   Electronically Signed   By: Inez Catalina M.D.   On: 08/07/2015 07:39   Dg Chest Port 1 View  07/30/2015   CLINICAL DATA:  Patient with shortness of breath since last Saturday. Recent diagnosis congestive heart failure.  EXAM: PORTABLE CHEST - 1 VIEW  COMPARISON:  Chest CT 07/28/2015; chest radiograph 07/22/2015  FINDINGS: Monitoring leads overlie the patient. Cardiomegaly. Moderate left and small right pleural effusions. Bilateral perihilar interstitial pulmonary opacities. No pneumothorax.  IMPRESSION: Cardiomegaly with increasing interstitial opacities concerning for pulmonary edema. Moderate left and small right pleural effusions.   Electronically Signed   By: Lovey Newcomer M.D.   On: 07/30/2015 21:18    Micro Results      No results found for this or any previous visit (from the past 240 hour(s)).     Today   Subjective   Colleen [name] today has no headache,no chest abdominal pain,no new weakness tingling or numbness, feels much better wants to go home today.     Objective   Blood pressure 142/48, pulse 55, temperature 97.6 F (36.4 C), temperature source Oral, resp. rate 16, height _0  (1.651 m), weight 77.883 kg (171 lb 11.2 oz), SpO2 97 %.   Intake/Output Summary (Last 24 hours) at 08/16/15 1317 Last data filed at 08/16/15 1051  Gross per 24 hour  Intake    720 ml  Output   2870 ml  Net  -2150 ml    Exam  Awake Alert, Oriented x 3, No new F.N deficits, Normal affect [place].AT,PERRAL Supple  Neck,No JVD, No cervical lymphadenopathy appriciated.  Symmetrical Chest wall movement, Good air movement bilaterally, CTAB RRR,No Gallops,Rubs or new Murmurs, No Parasternal Heave +ve B.Sounds, Abd Soft, Non tender, No organomegaly appriciated, No rebound -guarding or rigidity. No Cyanosis, Clubbing or edema, No new Rash or bruise   Data Review   CBC w Diff:  Lab Results  Component Value Date   WBC 4.8 08/14/2015   HGB 10.0* 08/14/2015   HCT 30.8* 08/14/2015   PLT 185 08/14/2015   LYMPHOPCT 17 08/14/2015   BANDSPCT PENDING 07/30/2015   MONOPCT 9 08/14/2015   EOSPCT 0 08/14/2015   BASOPCT 0 08/14/2015    CMP:  Lab Results  Component Value Date   NA 135 08/16/2015   K 3.4* 08/16/2015   CL 87* 08/16/2015   CO2 32 08/16/2015   BUN 52* 08/16/2015   CREATININE 1.28* 08/16/2015   PROT 7.3 07/03/2007   ALBUMIN 3.2* 08/12/2015   BILITOT 0.8 07/03/2007   ALKPHOS 44 07/03/2007   AST 44* 07/03/2007   ALT 32 07/03/2007   Lab Results  Component Value Date   TSH 1.011 08/13/2015    Total Time in preparing paper work, data evaluation and todays exam - 35 minutes  Lala Lund K M.D on 08/16/2015 at 1:17 PM  [facility]   Office  [phone]

## 2015-08-16 NOTE — Progress Notes (Addendum)
Advanced Heart Failure Rounding Note   Subjective:    Yesterday diuresed with IV lasix+ metolazone. Weight down 5 pounds.   Denies SOB/Orthopnea.   RHC 8/22 RA mean 20 RV 72/20 PA 72/27, mean 39 PCWP mean 29, prominent V-waves Oxygen saturations: PA 65% AO 97% Cardiac Output (Fick) 5.75  Cardiac Index (Fick) 3.06  PVR 1.7 WU  Objective:   Weight Range:  Vital Signs:   Temp:  [97.6 F (36.4 C)-98.4 F (36.9 C)] 97.6 F (36.4 C) (08/24 5809) Pulse Rate:  [55-61] 55 (08/24 0633) Resp:  [16-18] 16 (08/24 0633) BP: (131-142)/(46-60) 142/48 mmHg (08/24 0633) SpO2:  [94 %-97 %] 97 % (08/24 9833) Weight:  [171 lb 11.2 oz (77.883 kg)] 171 lb 11.2 oz (77.883 kg) (08/24 0633) Last BM Date: 08/15/15  Weight change: Filed Weights   08/14/15 0551 08/15/15 0544 08/16/15 8250  Weight: 179 lb 7.3 oz (81.4 kg) 176 lb 8 oz (80.06 kg) 171 lb 11.2 oz (77.883 kg)    Intake/Output:   Intake/Output Summary (Last 24 hours) at 08/16/15 0818 Last data filed at 08/16/15 0630  Gross per 24 hour  Intake   1200 ml  Output   3270 ml  Net  -2070 ml     Physical Exam: General:  Well appearing. No resp difficulty. Standing in room.   HEENT: normal Neck: supple. JVP 8-9 cm.  Carotids 2+ bilat; no bruits. No lymphadenopathy or thryomegaly appreciated. Cor: PMI nondisplaced. Regular rate & rhythm. No rubs, gallops or murmurs. Lungs: clear Abdomen: soft, nontender, mildly distended. No hepatosplenomegaly. No bruits or masses. Good bowel sounds. Extremities: no cyanosis, clubbing, rash, edema Neuro: alert & orientedx3, cranial nerves grossly intact. moves all 4 extremities w/o difficulty. Affect pleasant  Telemetry: Junctional Rhythm 60s   Labs: Basic Metabolic Panel:  Recent Labs Lab 08/12/15 0305 08/13/15 0435 08/14/15 0338 08/15/15 0557 08/16/15 0317  NA 127* 128* 131* 134* 135  K 4.3 4.6 3.8 4.1 3.4*  CL 90* 91* 91* 93* 87*  CO2 28 27 32 30 32  GLUCOSE 133* 147* 127* 117*  113*  BUN 72* 65* 60* 48* 52*  CREATININE 2.33* 1.34* 1.18* 1.04* 1.28*  CALCIUM 8.8* 9.2 9.1 9.3 9.6  MG  --  2.3 2.1  --   --   PHOS 5.6*  --   --   --   --     Liver Function Tests:  Recent Labs Lab 08/12/15 0305  ALBUMIN 3.2*   No results for input(s): LIPASE, AMYLASE in the last 168 hours. No results for input(s): AMMONIA in the last 168 hours.  CBC:  Recent Labs Lab 08/12/15 1211 08/14/15 0338  WBC 6.9 4.8  NEUTROABS  --  3.5  HGB 10.7* 10.0*  HCT 32.4* 30.8*  MCV 100.0 100.3*  PLT 238 185    Cardiac Enzymes: No results for input(s): CKTOTAL, CKMB, CKMBINDEX, TROPONINI in the last 168 hours.  BNP: BNP (last 3 results)  Recent Labs  07/22/15 1715 07/30/15 2050 08/06/15 1703  BNP 394.9* 346.3* 189.9*    ProBNP (last 3 results) No results for input(s): PROBNP in the last 8760 hours.    Other results:  Imaging: No results found.   Medications:     Scheduled Medications: . enoxaparin (LOVENOX) injection  40 mg Subcutaneous Q24H  . furosemide  80 mg Intravenous Q8H  . levothyroxine  100 mcg Oral QAC breakfast  . polyethylene glycol  17 g Oral BID  . potassium chloride  40 mEq Oral Q4H  .  predniSONE  40 mg Oral Q breakfast  . tiotropium  18 mcg Inhalation Daily    Infusions: . sodium chloride 1 mL/kg/hr (08/14/15 0135)    PRN Medications: acetaminophen, calcium carbonate, diphenhydrAMINE, fentaNYL, lidocaine, midazolam, ondansetron (ZOFRAN) IV   Assessment/Plan :  Colleen [name] is a 80 yo female with PMHx of diastolic CHF (echo 01/29/50 EF 65-70%), HTN, hypothyroidism, and sinus bradycardia who presented to the ED on 07/30/15 with complaint of DOE, weight gain, orthopnea and increased edema.   1. Dyspnea/Hypoxia- patient seen by PCCM. CT shows infiltrates of unknown etiology with concern for inflammatory lung disease.  -- ECHO with bubble study negative for cardiac or pulmonary shunt. -- PCCM suspects underlying inflammatory process that is  autoimmune in etiology given her elevated ESR & rheumatoid factor. An infectious etiology cannot be completely ruled out despite unremarkable PCT.  ANA negative.  Anti-CCP pending. Currently on prednisone. 2. A/C Diastolic CHF: EF normal at 65-70%, RV mildly dilated. RHC with elevated filling pressures noted as well as pulmonary venous hypertension. Mild residual volume overload but diuresed well yesterday and weight down. Mild rise in creatinine.  - Stop IV lasix. Start po lasix today--> 40 mg twice daily .  3. HTN: controlled. Continue current regimen.  4. Acute Kidney Injury: Mild rise in creatinine today.  Will keep off ACEI.  Suspect AKI was mediated by hypotension.  Off midodrine now.   5. Hypokalemia: repleted 6. Junctional rhythm: Off BB for the last 2 weeks. Stable.    Will schedule follow up in HF clinic next week.   D/C meds Kdur 40 meq daily Lasix 40 mg twice daily Would stop lisinopril/HCT for now.    Length of Stay: 17  CLEGG,AMY NP-C  08/16/2015, 8:18 AM  Advanced Heart Failure Team Pager [phone] (M-F; [place])  Please contact [facility] for night-coverage after hours (4p -7a ) and weekends on [email or web address]  Patient seen with NP, agree with the above note.  Mild rise in creatinine, good diuresis with weight down.  Still with mild volume overload, but will deal with this slowly with po Lasix.   - Lasix 40 mg po bid - Continue to hold off on lisinopril/HCT  Suspected inflammatory lung disease => prednisone 40 mg daily until sees pulmonary as outpatient.  Looks like she will need home oxygen.   Overall improved.   Loralie Champagne 08/16/2015 8:37 AM

## 2015-08-17 ENCOUNTER — Telehealth: Payer: Self-pay

## 2015-08-17 ENCOUNTER — Encounter: Payer: Medicare Other | Admitting: Cardiology

## 2015-08-17 NOTE — Telephone Encounter (Signed)
Pt agreed to hospital follow up appointment.  TCM information provided below.  Admission date: 07/30/2015   Discharge Date: 08/16/2015   Recommendations for primary care physician for things to follow:  Monitor CBC, BMP, Weight and diuretic use closely   Admission Diagnosis SOB (shortness of breath) [R06.02]  Acute kidney injury (nontraumatic) [N17.9]  Acute exacerbation of CHF (congestive heart failure) [I50.9]  Elevated troponin I level [R79.89]   Transition Care Management Follow-up Telephone Call  Hospital Follow Up Appt Scheduled: 09/04/15 at 11:30 am with Dr. [name]    How have you been since you were released from the hospital?  Pt states she's doing much better today.  Weight is stable at 173 lbs.  No c/o chest pain, SOB, edema in extremities, or excessive weakness/fatigue.  She on O2 @ 3 L [place].  She was able to shower and dress today without difficulty.     Do you understand why you were in the hospital? yes   Do you understand the discharge instructions? yes  Items Reviewed:  Medications reviewed: yes, pt is not taking Spiriva due to cost.    Allergies reviewed: yes  Dietary changes reviewed: yes, low sodium heart healthy  Referrals reviewed: yes, appts scheduled.    Functional Questionnaire:   Activities of Daily Living (ADLs):   She states they are independent in the following: ambulation, bathing and hygiene, feeding, continence, grooming, toileting and dressing States they require assistance with the following: Husband drives her where she needs to go.  Pt is newly on oxygen and is afraid to drive by herself long distances.     Any transportation issues/concerns?: Yes, she can only go to appointment on the days her husband can drive her.     Any patient concerns? yes, pt would like to switch providers due to traveling distance.    Confirmed importance and date/time of follow-up visits scheduled: yes   Confirmed with patient if condition begins to worsen  call PCP or go to the ER: yes

## 2015-08-17 NOTE — Telephone Encounter (Signed)
Needs hosp f/u. 

## 2015-08-17 NOTE — Telephone Encounter (Addendum)
Colleen [name], is a 80 y.o. female DOB July 23, 1935 MRN [id number].  Admission date: 07/30/2015 Admitting Physician Theressa Millard, MD  Discharge Date: 08/16/2015   Primary MD Garnet Koyanagi, DO  Recommendations for primary care physician for things to follow:   Monitor CBC, BMP, Weight and diuretic use closely   Admission Diagnosis SOB (shortness of breath) [R06.02] Acute kidney injury (nontraumatic) [N17.9] Acute exacerbation of CHF (congestive heart failure) [I50.9] Elevated troponin I level [R79.89]    Called patient to schedule hospital follow up.  Pt stated that she was in the process of finding a new doctor because she is just not able to drive out to [facility] to see a doctor.  Pt was strongly advised to follow up with Dr. [name] first for at least the hospital follow up, because Dr. [name] is more familiar with her.  Pt declined again, stating she's going to have to see if her new PCP (once she finds one) will see her instead.  She again stated that she's not able to make it all the way out to [facility] and she does not have any one to take her.    Routing message to Dr. [name] for [facility]

## 2015-08-21 ENCOUNTER — Encounter: Payer: Self-pay | Admitting: Cardiology

## 2015-08-21 ENCOUNTER — Ambulatory Visit (INDEPENDENT_AMBULATORY_CARE_PROVIDER_SITE_OTHER): Payer: Medicare Other | Admitting: Cardiology

## 2015-08-21 VITALS — BP 138/62 | HR 60 | Ht 65.0 in | Wt 176.6 lb

## 2015-08-21 DIAGNOSIS — R918 Other nonspecific abnormal finding of lung field: Secondary | ICD-10-CM

## 2015-08-21 DIAGNOSIS — I10 Essential (primary) hypertension: Secondary | ICD-10-CM

## 2015-08-21 DIAGNOSIS — I5031 Acute diastolic (congestive) heart failure: Secondary | ICD-10-CM | POA: Diagnosis not present

## 2015-08-21 DIAGNOSIS — R001 Bradycardia, unspecified: Secondary | ICD-10-CM

## 2015-08-21 DIAGNOSIS — I779 Disorder of arteries and arterioles, unspecified: Secondary | ICD-10-CM

## 2015-08-21 DIAGNOSIS — I739 Peripheral vascular disease, unspecified: Secondary | ICD-10-CM

## 2015-08-21 DIAGNOSIS — R0789 Other chest pain: Secondary | ICD-10-CM | POA: Diagnosis not present

## 2015-08-21 LAB — BASIC METABOLIC PANEL
BUN: 32 mg/dL — ABNORMAL HIGH (ref 6–23)
CO2: 35 mEq/L — ABNORMAL HIGH (ref 19–32)
Calcium: 10.5 mg/dL (ref 8.4–10.5)
Chloride: 96 mEq/L (ref 96–112)
Creatinine, Ser: 1.04 mg/dL (ref 0.40–1.20)
GFR: 54.5 mL/min — ABNORMAL LOW (ref >60.00)
Glucose, Bld: 113 mg/dL — ABNORMAL HIGH (ref 70–99)
Potassium: 3.6 mEq/L (ref 3.5–5.1)
Sodium: 138 mEq/L (ref 135–145)

## 2015-08-21 MED ORDER — AMLODIPINE BESYLATE 10 MG PO TABS: 10.0000 mg | ORAL_TABLET | Freq: Every day | ORAL | Status: DC

## 2015-08-21 NOTE — Assessment & Plan Note (Signed)
Controlled, she had not taken her medications this.

## 2015-08-21 NOTE — Assessment & Plan Note (Signed)
On high dose steroids, pulmonary f/u 08/31/15

## 2015-08-21 NOTE — Assessment & Plan Note (Signed)
Low risk Myoview Feb 2016 

## 2015-08-21 NOTE — Patient Instructions (Addendum)
Medication Instructions:  Amlodipine( 10 mg) daily  was filled at patients office visit today sent per patients request, [facility] on [address]  Labwork: bmet  Testing/Procedures: -none  Follow-Up: Your physician recommends that you keep your scheduled  follow-up appointment with Dr. Gwenlyn Found on November 16th   Any Other Special Instructions Will Be Listed Below (If Applicable).

## 2015-08-21 NOTE — Assessment & Plan Note (Signed)
Grade 2 diastolic dysfunction, Rt heart cath 08/14/15

## 2015-08-21 NOTE — Assessment & Plan Note (Signed)
Taken off beta blocker

## 2015-08-21 NOTE — Progress Notes (Signed)
08/21/2015 Colleen [name]   [birth year]  [id number]  Primary Physician Garnet Koyanagi, DO Primary Cardiologist: Dr Gwenlyn Found  HPI:  80 y/o female, followed by Dr Gwenlyn Found. She had chest pain in Feb 2016 and had a low risk Myoview. She was admitted 8/7-8/24/16 with respiratory failure, acute diastolic CHF, and pulmonary inflammatory process. She was placed on high dose steroids and is seen in follow up today in the office. She has been breathing better but is still on O2.    Current Outpatient Prescriptions  Medication Sig Dispense Refill  . amLODipine (NORVASC) 10 MG tablet Take 1 tablet (10 mg total) by mouth daily. 90 tablet 2  . Calcium Carbonate (GNP CALCIUM) 1500 MG TABS Take 1,500 mg by mouth every evening.     . Cholecalciferol 1000 UNITS tablet Take 1,000 Units by mouth every evening.     . furosemide (LASIX) 40 MG tablet Take 1 tablet (40 mg total) by mouth 2 (two) times daily. 30 tablet 1  . levothyroxine (SYNTHROID, LEVOTHROID) 100 MCG tablet Take 100 mcg by mouth daily before breakfast.     . magnesium 30 MG tablet Take 30 mg by mouth every evening.     . Misc Natural Products (TURMERIC CURCUMIN) CAPS Take 1 capsule by mouth every evening.    . niacin 500 MG tablet Take 500 mg by mouth every evening. otc    . NON FORMULARY Place 1.5 L into the nose continuous. 1.5 liters in office/ 2 liters at home    . potassium chloride SA (K-DUR,KLOR-CON) 20 MEQ tablet Take 1 tablet (20 mEq total) by mouth 2 (two) times daily. 60 tablet 0  . predniSONE (DELTASONE) 20 MG tablet Take 2 tablets (40 mg total) by mouth daily with breakfast. 30 tablet 0  . tiotropium (SPIRIVA) 18 MCG inhalation capsule Place 1 capsule (18 mcg total) into inhaler and inhale daily. 30 capsule 0  . vitamin B-12 (CYANOCOBALAMIN) 500 MCG tablet Take 500 mcg by mouth every evening.      No current facility-administered medications for this visit.    No Known Allergies  Social History   Social History  . Marital  Status: Married    Spouse Name: N/A  . Number of Children: N/A  . Years of Education: N/A   Occupational History  . Not on file.   Social History Main Topics  . Smoking status: Former Research scientist (life sciences)  . Smokeless tobacco: Not on file  . Alcohol Use: Yes  . Drug Use: No  . Sexual Activity: Not on file   Other Topics Concern  . Not on file   Social History Narrative     Review of Systems: General: negative for chills, fever, night sweats or weight changes.  Cardiovascular: negative for chest pain, dyspnea on exertion, edema, orthopnea, palpitations, paroxysmal nocturnal dyspnea or shortness of breath Dermatological: negative for rash Respiratory: negative for cough or wheezing Urologic: negative for hematuria Abdominal: negative for nausea, vomiting, diarrhea, bright red blood per rectum, melena, or hematemesis Neurologic: negative for visual changes, syncope, or dizziness All other systems reviewed and are otherwise negative except as noted above.    Blood pressure 138/62, pulse 60, height 5\' 5"  (1.651 m), weight 176 lb 9.6 oz (80.105 kg).  General appearance: alert, cooperative and no distress Neck: no JVD Lungs: clear to auscultation bilaterally Heart: regular rate and rhythm Extremities: extremities normal, atraumatic, no cyanosis or edema    ASSESSMENT AND PLAN:   Acute diastolic heart failure Grade 2 diastolic  dysfunction, Rt heart cath 08/14/15  Chest pain Low risk Myoview Feb 2016  Carotid artery disease Prior CEA  Essential hypertension Controlled, she had not taken her medications this.  Pulmonary infiltrate On high dose steroids, pulmonary f/u 08/31/15  Sinus bradycardia Taken off beta blocker   PLAN  Check BMP today. She has a f/u with Dr Julianne Handler and pulmonary in a few days will keep this. JB in 3 months  Colleen [name] K PA-C 08/21/2015 10:27 AM

## 2015-08-21 NOTE — Assessment & Plan Note (Signed)
Prior CEA

## 2015-08-24 ENCOUNTER — Ambulatory Visit (HOSPITAL_COMMUNITY)
Admit: 2015-08-24 | Discharge: 2015-08-24 | Disposition: A | Discharge status: Home / Self Care | Payer: Medicare Other | Source: Ambulatory Visit | Attending: Internal Medicine | Admitting: Internal Medicine

## 2015-08-24 VITALS — BP 128/73 | HR 74 | Resp 18 | Wt 179.2 lb

## 2015-08-24 DIAGNOSIS — R918 Other nonspecific abnormal finding of lung field: Secondary | ICD-10-CM

## 2015-08-24 DIAGNOSIS — I4891 Unspecified atrial fibrillation: Secondary | ICD-10-CM | POA: Diagnosis not present

## 2015-08-24 DIAGNOSIS — Z8673 Personal history of transient ischemic attack (TIA), and cerebral infarction without residual deficits: Secondary | ICD-10-CM | POA: Insufficient documentation

## 2015-08-24 DIAGNOSIS — I272 Other secondary pulmonary hypertension: Secondary | ICD-10-CM | POA: Diagnosis not present

## 2015-08-24 DIAGNOSIS — I10 Essential (primary) hypertension: Secondary | ICD-10-CM | POA: Diagnosis not present

## 2015-08-24 DIAGNOSIS — E785 Hyperlipidemia, unspecified: Secondary | ICD-10-CM | POA: Insufficient documentation

## 2015-08-24 DIAGNOSIS — E039 Hypothyroidism, unspecified: Secondary | ICD-10-CM | POA: Diagnosis not present

## 2015-08-24 DIAGNOSIS — I5032 Chronic diastolic (congestive) heart failure: Secondary | ICD-10-CM

## 2015-08-24 DIAGNOSIS — I480 Paroxysmal atrial fibrillation: Secondary | ICD-10-CM

## 2015-08-24 DIAGNOSIS — Z79899 Other long term (current) drug therapy: Secondary | ICD-10-CM | POA: Insufficient documentation

## 2015-08-24 DIAGNOSIS — Z9981 Dependence on supplemental oxygen: Secondary | ICD-10-CM | POA: Diagnosis not present

## 2015-08-24 DIAGNOSIS — R001 Bradycardia, unspecified: Secondary | ICD-10-CM | POA: Diagnosis not present

## 2015-08-24 DIAGNOSIS — Z87891 Personal history of nicotine dependence: Secondary | ICD-10-CM | POA: Diagnosis not present

## 2015-08-24 DIAGNOSIS — J9610 Chronic respiratory failure, unspecified whether with hypoxia or hypercapnia: Secondary | ICD-10-CM | POA: Diagnosis not present

## 2015-08-24 MED ORDER — WARFARIN SODIUM 5 MG PO TABS: 5.0000 mg | ORAL_TABLET | Freq: Every day | ORAL | Status: DC

## 2015-08-24 NOTE — Progress Notes (Signed)
Advanced Heart Failure Medication Review by a Pharmacist  Does the patient  feel that his/her medications are working for him/her?  yes  Has the patient been experiencing any side effects to the medications prescribed?  no  Does the patient measure his/her own blood pressure or blood glucose at home?  yes   Does the patient have any problems obtaining medications due to transportation or finances?   no  Understanding of regimen: good Understanding of indications: good Potential of compliance: excellent    Pharmacist comments:  Colleen [name] is a pleasant 80 yo F presenting with her up-to-date medication list. She states excellent compliance with all of her medications and has a great understanding of the importance of continued use. She did not have any specific medication-related questions or concerns at this time.   Colleen [name]. Colleen [name], PharmD, BCPS, CPP Clinical Pharmacist Pager: [phone] Phone: [phone] 08/24/2015 10:59 AM

## 2015-08-24 NOTE — Progress Notes (Signed)
Patient ID: Colleen [name], female   DOB: 03/24/1935, 80 y.o.   MRN: [id number] PCP: Primary HF Cardiologist:Dr Aundra Dubin   HPI: Colleen [name] is a 80 yo female with PMHx of diastolic CHF (echo 01/30/24 EF 65-70%), HTN, hypothyroidism, and sinus bradycardia.   Admitted to [facility] 07/30/15 with complaint of DOE, weight gain, orthopnea and increased edema. Diuresed with IV lasix and transitioned to lasix 40 mg twice a day. Hospital course compliacted by hypoxic respiratory failure. CT of chest negative for PE but she had bilateral lung infiltrates concerning for inflammatory lung disease.  Therefore, placed on solumedrol then transtioned to prednisone Prinizide was stopped.  Discharge weight was 171 pounds.    She returns for post hospital follow up. Mild dyspnea with exertion. SOB with steps. Does admit to fatigue after performing ADLs. Wearing 1.5 liters oxygen daily. Weight at home had  Increased -->173-178 pounds. Remains prednisone. Increased lower extremity edema. Following low salt diet. Limiting fluid intake.   At times in the past, she has been noted to have a junctional rhythm.  Today, she is in clear atrial fibrillation with HR 60s.    RHC 08/14/2015  RA mean 20 RV 72/20 PA 72/27, mean 39 PCWP mean 29, prominent V-waves Oxygen saturations: PA 65% AO 97% Cardiac Output (Fick) 5.75  Cardiac Index (Fick) 3.06  PVR 1.7 WU  Labs 8/29//2016: K 3.6 Creatinine 1.04 , hemoglobin 10   ROS: All systems negative except as listed in HPI, PMH and Problem List.  SH:  Social History   Social History  . Marital Status: Married    Spouse Name: N/A  . Number of Children: N/A  . Years of Education: N/A   Occupational History  . Not on file.   Social History Main Topics  . Smoking status: Former Research scientist (life sciences)  . Smokeless tobacco: Not on file  . Alcohol Use: Yes  . Drug Use: No  . Sexual Activity: Not on file   Other Topics Concern  . Not on file   Social History Narrative    FH: No premature  CAD  Past Medical History  Diagnosis Date  . Hyperlipidemia   . Hypertension   . Hypothyroidism   . TIA (transient ischemic attack) 2001  . Carotid artery disease   . Shortness of breath   . CHF (congestive heart failure)     Current Outpatient Prescriptions  Medication Sig Dispense Refill  . amLODipine (NORVASC) 10 MG tablet Take 1 tablet (10 mg total) by mouth daily. 90 tablet 2  . Calcium Carbonate (GNP CALCIUM) 1500 MG TABS Take 1,500 mg by mouth every evening.     . Cholecalciferol 1000 UNITS tablet Take 1,000 Units by mouth every evening.     . furosemide (LASIX) 40 MG tablet Take 1 tablet (40 mg total) by mouth 2 (two) times daily. 30 tablet 1  . levothyroxine (SYNTHROID, LEVOTHROID) 100 MCG tablet Take 100 mcg by mouth daily before breakfast.     . magnesium 30 MG tablet Take 30 mg by mouth every evening.     . Misc Natural Products (TURMERIC CURCUMIN) CAPS Take 1 capsule by mouth every evening.    . niacin 500 MG tablet Take 500 mg by mouth every evening. otc    . NON FORMULARY Place 1.5 L into the nose continuous. 1.5 liters in office/ 2 liters at home    . potassium chloride SA (K-DUR,KLOR-CON) 20 MEQ tablet Take 1 tablet (20 mEq total) by mouth 2 (two) times daily. [place]  tablet 0  . predniSONE (DELTASONE) 20 MG tablet Take 2 tablets (40 mg total) by mouth daily with breakfast. 30 tablet 0  . vitamin B-12 (CYANOCOBALAMIN) 500 MCG tablet Take 500 mcg by mouth every evening.      No current facility-administered medications for this encounter.    Filed Vitals:   08/24/15 1049  BP: 128/73  Pulse: 74  Resp: 18  Weight: 179 lb 4 oz (81.307 kg)  SpO2: 94%    PHYSICAL EXAM:  General:  Well appearing. No resp difficulty Colleen [name] present   HEENT: normal Neck: supple. JVP ~10 . Carotids 2+ bilaterally; no bruits. No lymphadenopathy or thryomegaly appreciated. Cor: PMI normal. Irregular.  & rhythm. No rubs, gallops or murmurs. Lungs: clear Abdomen: soft, nontender,  nondistended. No hepatosplenomegaly. No bruits or masses. Good bowel sounds. Extremities: no cyanosis, clubbing, rash,  R and LLE 2+ edema Neuro: alert & orientedx3, cranial nerves grossly intact. Moves all 4 extremities w/o difficulty. Affect pleasant.  EKG: A fib 55 BPM    ASSESSMENT & PLAN:  1. Chronic  Respiratory Failure: Pulmonary work up - CT showed infiltrates of unknown etiology with concern for inflammatory lung disease. ECHO with bubble study negative for cardiac or pulmonary shunt.  PCCM suspects underlying inflammatory process that is autoimmune in etiology given her elevated ESR & rheumatoid factor. An infectious etiology cannot be completely ruled out despite unremarkable PCT. ANA negative.  - Completing prednisone course.  - Has followup with pulmonary.  - Remains on home oxygen.  2. Chronic Diastolic CHF: EF normal at 65-70%, RV mildly dilated. RHC with elevated filling pressures noted as well as pulmonary venous hypertension.  Volume status elevated.  - Will increase lasix. ---> X4  days. 80 mg twice daily + 40 meq K twice daily then decrease lasix to 60 mg twice daily + 20 meq K twice daily.   3. HTN: controlled. Continue current regimen.  4. H/o accelerated junctional rhythm 5. A fIB: Newly noted today.  CHADSVASC Score 6. EKG today --> A fib. Controlled rated. No BB with bradycardia. Needs to start coumadin. Does not want eliquis due to cost. Today the HF pharmacist provided educations on coumadin. Plan to send to Coumadin Clinic at [facility] to manage INR.   CLEGG,AMY NP-C  8:38 PM   Patient seen with NP, agree with the above note.  1. Chronic hypoxemic respiratory failure:  CT in hospital showed multifocal infiltrates of uncertain etiology.  There was concern for inflammatory lung disease with elevated ESR. She remains on oxygen.  She is getting steroids per pulmonary and has followup with pulmonary.  2. Chronic diastolic CHF: She remains volume overloaded on exam  today.  Will increase Lasix to 80 mg bid x 4 days then 60 mg bid after that.  Increase KCl to 40 bid.  BMET/BNP today and followup here in 2 wks.  3. Atrial fibrillation: With controlled response, HR 50s.  She has had periodic accelerated junctional rhythm in the past, but today is the first day atrial fibrillation has been noted.  CHADSVASC = 4.   - We discussed DOACs but she has to pay for all meds out of pocket so we will be starting coumadin. No h/o GI bleeding.  - No rate control meds needed.  - I do not think that cardioversion is going to be helpful, as her prior rhythm has frequently been junctional (sinus node dysfunction).   Loralie Champagne 08/25/2015 11:59 AM

## 2015-08-24 NOTE — Patient Instructions (Addendum)
INCREASE Lasix to 80mg  (2 tabs) twice daily for FOUR DAYS, then reduce back to normal dose of 40mg  (1 tab) twice daily.  INCREASE Potassium to 40 meq (2 tabs) twice daily for FOUR DAYS, then reduce back to normal dose of 20 meq (1 tab) twice daily.  START Coumadin 5mg  tablet once daily.  Will refer you to Coumadin Clinic. [facility]. Address: [address], [place], [place] [address]  Phone: [phone]  Follow up 2 weeks.  Do the following things EVERYDAY: 1) Weigh yourself in the morning before breakfast. Write it down and keep it in a log. 2) Take your medicines as prescribed 3) Eat low salt foods-Limit salt (sodium) to 2000 mg per day.  4) Stay as active as you can everyday 5) Limit all fluids for the day to less than 2 liters

## 2015-08-25 DIAGNOSIS — I5032 Chronic diastolic (congestive) heart failure: Secondary | ICD-10-CM | POA: Insufficient documentation

## 2015-08-30 ENCOUNTER — Ambulatory Visit (INDEPENDENT_AMBULATORY_CARE_PROVIDER_SITE_OTHER): Payer: Medicare Other | Admitting: Pharmacist Clinician (PhC)/ Clinical Pharmacy Specialist

## 2015-08-30 DIAGNOSIS — I480 Paroxysmal atrial fibrillation: Secondary | ICD-10-CM

## 2015-08-30 DIAGNOSIS — Z7901 Long term (current) use of anticoagulants: Secondary | ICD-10-CM | POA: Diagnosis not present

## 2015-08-30 LAB — POCT INR: INR: 3.4

## 2015-08-31 ENCOUNTER — Ambulatory Visit (INDEPENDENT_AMBULATORY_CARE_PROVIDER_SITE_OTHER): Payer: Medicare Other | Admitting: Pulmonary Disease

## 2015-08-31 ENCOUNTER — Encounter: Payer: Self-pay | Admitting: Pulmonary Disease

## 2015-08-31 ENCOUNTER — Telehealth: Payer: Self-pay | Admitting: Pulmonary Disease

## 2015-08-31 VITALS — BP 142/64 | HR 88 | Ht 65.5 in | Wt 174.6 lb

## 2015-08-31 DIAGNOSIS — J9611 Chronic respiratory failure with hypoxia: Secondary | ICD-10-CM | POA: Diagnosis not present

## 2015-08-31 DIAGNOSIS — R918 Other nonspecific abnormal finding of lung field: Secondary | ICD-10-CM

## 2015-08-31 MED ORDER — PREDNISONE 10 MG PO TABS: ORAL_TABLET | ORAL | Status: DC

## 2015-08-31 NOTE — Telephone Encounter (Signed)
PFT 08/07/15: FVC 1.72 L (60%) FEV1 1.18 L (86%) FEV1/FVC 0.69FEF 25-75 0.66 L (41%)  IMAGING HRCT CHEST W/O 08/07/15 (personally reviewed by me):mild cardiomegaly without pericardial effusion. Mediastinal adenopathy with largest lymph node measuring 1.2 cm in short axis.Nodularity overall bronchovascular bundles predominantly in the lower lobes.Mild intralobular septal thickening again predominantly in the lower lobes. Trace bilateral pleural effusions.No honeycombing changes. Patchy groundglass opacities appreciated.Nodular contour to the liver suggesting cirrhosis.  CARDIAC TTE W/ BUBBLE STUDY (08/08/15):  LV normal in size with normal wall thickness. EF 65-70%. No regional wall motion abnormalities. LA moderately to severely dilated. RA normal in size. No shunt or PFO on bubble study. RV mildly dilated with preserved systolic function. Pulmonary artery systolic pressure 46 mmHg. No mitral stenosis or regurg. IVC 2.3 cm.  LABS 08/21/15 BMP: 138/3.6/96/35/32/1.0/113/10.5  08/08/15 ANA:  Negative  08/07/15 HIV:  Negative ESR:  98 CRP:  0.7 Procalcitonin:  <0.1 RF:  57.8

## 2015-08-31 NOTE — Patient Instructions (Signed)
1. Monitoring her oxygen as we continue to wean your oral prednisone. 2. We will schedule you for a walking test to determine how much oxygen you need if any. 3. We will perform a breathing test at your next appointment to see how your lungs are functioning. 4. I am tapering her prednisone over a period of 3 weeks. Please contact me if her breathing worsens or you have any problems as we do this. 5. You will return to clinic in 3-4 weeks.

## 2015-08-31 NOTE — Progress Notes (Signed)
Subjective:    Patient ID: Colleen [name], female    DOB: 1935/08/09, 80 y.o.   MRN: [id number]  C.C.:  Follow-up for Lung Nodules & Hypoxic Respiratory Failure.  HPI Lung Nodules: Unable to perform bronchoscopy and lavage during hospitalization due to degree of hypoxia. Rheumatoid factor positive but ANA negative on serum testing. Patient empirically started on Solu-Medrol that was tapered to prednisone 40 mg by mouth daily while hospitalized. She reports her dyspnea on exertion is improving slightly. No coughing.   Hypoxic Respiratory Failure:  Patient underwent diuresis in hospital that caused acute renal failure.  She reports she is now off oxygen during the day. She reports she is currently using oxygen at 1 L/m at night while sleeping. She has adjusted it herself based on pulse oximetry at home. In the hospital she reports she was on 1 L/m.  Review of Systems No fever chills, or sweats. No chest pain or pressure. No nausea, emesis, or diarrhea.   No Known Allergies  Current Outpatient Prescriptions on File Prior to Visit  Medication Sig Dispense Refill  . amLODipine (NORVASC) 10 MG tablet Take 1 tablet (10 mg total) by mouth daily. 90 tablet 2  . Calcium Carbonate (GNP CALCIUM) 1500 MG TABS Take 1,500 mg by mouth every evening.     . Cholecalciferol 1000 UNITS tablet Take 1,000 Units by mouth every evening.     . furosemide (LASIX) 40 MG tablet Take 1 tablet (40 mg total) by mouth 2 (two) times daily. 30 tablet 1  . levothyroxine (SYNTHROID, LEVOTHROID) 100 MCG tablet Take 100 mcg by mouth daily before breakfast.     . magnesium 30 MG tablet Take 30 mg by mouth every evening.     . niacin 500 MG tablet Take 500 mg by mouth every evening. otc    . NON FORMULARY Place 1.5 L into the nose continuous. 1.5 liters in office/ 2 liters at home    . potassium chloride SA (K-DUR,KLOR-CON) 20 MEQ tablet Take 1 tablet (20 mEq total) by mouth 2 (two) times daily. 60 tablet 0  . vitamin B-12  (CYANOCOBALAMIN) 500 MCG tablet Take 500 mcg by mouth every evening.     . warfarin (COUMADIN) 5 MG tablet Take 1 tablet (5 mg total) by mouth daily. 30 tablet 3   No current facility-administered medications on file prior to visit.   Past Medical History  Diagnosis Date  . Hyperlipidemia   . Hypertension   . Hypothyroidism   . TIA (transient ischemic attack) 2001  . Carotid artery disease   . Shortness of breath   . CHF (congestive heart failure)   . Atrial fibrillation    Past Surgical History  Procedure Laterality Date  . Cholecystectomy    . Abdominal hysterectomy  1975    TAH,BSO  . Carotid endarterectomy  2001    left  . Doppler echocardiography  02/13/2010    EF>55%  . Doppler echocardiography  09/29/2007  . Nm myoview ltd  02/13/2010  . Carotid duplex  11/24/2012  . Carotid duplex  11/01/2010  . Renal duplex  02/13/2010  . Video bronchoscopy Bilateral 08/09/2015    Procedure: VIDEO BRONCHOSCOPY WITHOUT FLUORO;  Surgeon: Collene Gobble, MD;  Location: [facility];  Service: Cardiopulmonary;  Laterality: Bilateral;  . Cardiac catheterization N/A 08/14/2015    Procedure: Right Heart Cath;  Surgeon: Larey Dresser, MD;  Location: [facility];  Service: Cardiovascular;  Laterality: N/A;   Family History  Problem Relation Age of Onset  . Heart failure Mother   . Parkinson's disease Mother   . Arthritis Mother   . Arthritis Sister   . Colon cancer Brother   . Rheumatologic disease Neg Hx   . Lung disease Neg Hx    Social History   Social History  . Marital Status: Married    Spouse Name: N/A  . Number of Children: 2  . Years of Education: N/A   Occupational History  . retired    Social History Main Topics  . Smoking status: Former Smoker -- 1.00 packs/day for 47 years    Types: Cigarettes    Quit date: 12/24/1999  . Smokeless tobacco: None  . Alcohol Use: 0.0 oz/week    0 Standard drinks or equivalent per week     Comment: 1 glass of wine rarely  .  Drug Use: No  . Sexual Activity: Not Asked   Other Topics Concern  . None   Social History Narrative   Originally she is from [place]. She has previously lived in [place] & moved to [place] in 1985. 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 No mold or hot tub exposure. No bird exposure.       Objective:   Physical Exam Blood pressure 142/64, pulse 88, height 5' 5.5" (1.664 m), weight 174 lb 9.6 oz (79.198 kg), SpO2 94 %. General:  Awake. Alert. Mild central obesity. Accompanied by husband today.  Integument:  Warm & dry. No rash on exposed skin. Mild bruising on bilateral forearms. Lymphatics:  No appreciated cervical or supraclavicular lymphadenoapthy. HEENT:  Moist mucus membranes. No oral ulcers. No scleral injection or icterus.  Cardiovascular:  Regular rhythm & rate. No edema.   Pulmonary:  Good aeration & clear to auscultation bilaterally. Symmetric chest wall expansion. No accessory muscle use on room air. Musculoskeletal:  Normal bulk and tone. Hand grip strength 5/5 bilaterally. No joint deformity or effusion appreciated.  PFT 08/07/15: FVC 1.72 L (60%) FEV1 1.18 L (86%) FEV1/FVC 0.69FEF 25-75 0.66 L (41%)  IMAGING HRCT CHEST W/O 08/07/15 (personally reviewed by me):mild cardiomegaly without pericardial effusion. Mediastinal adenopathy with largest lymph node measuring 1.2 cm in short axis.Nodularity overall bronchovascular bundles predominantly in the lower lobes.Mild intralobular septal thickening again predominantly in the lower lobes. Trace bilateral pleural effusions.No honeycombing changes. Patchy groundglass opacities appreciated.Nodular contour to the liver suggesting cirrhosis.  CARDIAC TTE W/ BUBBLE STUDY (08/08/15): LV normal in size with normal wall thickness. EF 65-70%. No regional wall motion abnormalities. LA moderately to severely dilated. RA normal in size. No shunt or PFO on bubble study. RV mildly dilated with preserved systolic function. Pulmonary  artery systolic pressure 46 mmHg. No mitral stenosis or regurg. IVC 2.3 cm.  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 Symptomatically the patient did seem to have a significant response after initiation of steroid therapy. She is continuing to use oxygen at home but predominantly only uses at night while sleeping given her daytime improvement on her own portable pulse oximeter. I did caution the patient that as we continue to taper her steroid therapy her breathing may worsen. The patient is highly motivated to wean her  prednisone given that it is causing significant insomnia and preventing further weight loss. I instructed the patient to contact my office if she developed any new breathing problems before her next appointment.  1. 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 Tapering prednisone by 10 mg every 7 days. Repeat high-resolution CT scan of the chest without contrast. Checking full pulmonary function testing without bronchodilator challenge at next appointment. Did caution the patient we may need to bring in a steroid sparing agent such as CellCept or methotrexate if she does require prednisone moving forward. Before that I would check a QuantiFERON-TB and repeat serum autoimmune workup. 2. Chronic hypoxic respiratory failure: Patient appears euvolemic. Checking 6 minute walk test. 3. Follow-up: Patient to return to clinic in 3-4 weeks.

## 2015-09-04 ENCOUNTER — Ambulatory Visit (INDEPENDENT_AMBULATORY_CARE_PROVIDER_SITE_OTHER): Payer: Medicare Other | Admitting: Pharmacist Clinician (PhC)/ Clinical Pharmacy Specialist

## 2015-09-04 ENCOUNTER — Encounter: Payer: Self-pay | Admitting: Family Medicine

## 2015-09-04 ENCOUNTER — Ambulatory Visit (INDEPENDENT_AMBULATORY_CARE_PROVIDER_SITE_OTHER): Payer: Medicare Other | Admitting: Family Medicine

## 2015-09-04 VITALS — BP 122/70 | HR 84 | Resp 16 | Ht 66.0 in | Wt 174.4 lb

## 2015-09-04 DIAGNOSIS — J9611 Chronic respiratory failure with hypoxia: Secondary | ICD-10-CM

## 2015-09-04 DIAGNOSIS — E039 Hypothyroidism, unspecified: Secondary | ICD-10-CM

## 2015-09-04 DIAGNOSIS — I5032 Chronic diastolic (congestive) heart failure: Secondary | ICD-10-CM

## 2015-09-04 DIAGNOSIS — R918 Other nonspecific abnormal finding of lung field: Secondary | ICD-10-CM | POA: Diagnosis not present

## 2015-09-04 DIAGNOSIS — I480 Paroxysmal atrial fibrillation: Secondary | ICD-10-CM

## 2015-09-04 DIAGNOSIS — Z7901 Long term (current) use of anticoagulants: Secondary | ICD-10-CM

## 2015-09-04 DIAGNOSIS — N179 Acute kidney failure, unspecified: Secondary | ICD-10-CM | POA: Diagnosis not present

## 2015-09-04 DIAGNOSIS — J9610 Chronic respiratory failure, unspecified whether with hypoxia or hypercapnia: Secondary | ICD-10-CM | POA: Insufficient documentation

## 2015-09-04 LAB — POCT INR: INR: 3.6

## 2015-09-04 NOTE — Assessment & Plan Note (Signed)
Per pulm Pt on prednisone--- weaning off

## 2015-09-04 NOTE — Assessment & Plan Note (Signed)
Being followed by pulmonary.   

## 2015-09-04 NOTE — Assessment & Plan Note (Signed)
.   Lab Results  Component Value Date   CREATININE 1.04 08/21/2015

## 2015-09-04 NOTE — Assessment & Plan Note (Signed)
Lab Results  Component Value Date   TSH 1.011 08/13/2015   con't synthroid Recheck cpe

## 2015-09-04 NOTE — Progress Notes (Signed)
Pre visit review using our clinic review tool, if applicable. No additional management support is needed unless otherwise documented below in the visit note. 

## 2015-09-04 NOTE — Progress Notes (Signed)
Patient ID: Colleen [name], female    DOB: December 19, 1935  Age: 80 y.o. MRN: [id number]    Subjective:  Subjective HPI Colleen [name] presents for f/u hosp for dias/ sys chf and renal injury and resp failure hypoxia.   Pt has been weighing daily and weights have been stable.   No complaints.   She has upcoming appointments with pulmonary and cardiology.     Review of Systems  Constitutional: Negative for diaphoresis, appetite change, fatigue and unexpected weight change.  Eyes: Negative for pain, redness and visual disturbance.  Respiratory: Negative for cough, chest tightness, shortness of breath and wheezing.   Cardiovascular: Negative for chest pain, palpitations and leg swelling.  Endocrine: Negative for cold intolerance, heat intolerance, polydipsia, polyphagia and polyuria.  Genitourinary: Negative for dysuria, frequency and difficulty urinating.  Neurological: Negative for dizziness, light-headedness, numbness and headaches.    History Past Medical History  Diagnosis Date  . Hyperlipidemia   . Hypertension   . Hypothyroidism   . TIA (transient ischemic attack) 2001  . Carotid artery disease   . Shortness of breath   . CHF (congestive heart failure)   . Atrial fibrillation     She has past surgical history that includes Cholecystectomy; Abdominal hysterectomy (1975); Carotid endarterectomy (2001); doppler echocardiography (02/13/2010); doppler echocardiography (09/29/2007); NM MYOVIEW LTD (02/13/2010); carotid duplex (11/24/2012); carotid duplex (11/01/2010); renal duplex (02/13/2010); Video bronchoscopy (Bilateral, 08/09/2015); and Cardiac catheterization (N/A, 08/14/2015).   Her family history includes Arthritis in her mother and sister; Colon cancer in her brother; Heart failure in her mother; Parkinson's disease in her mother. There is no history of Rheumatologic disease or Lung disease.She reports that she quit smoking about 15 years ago. Her smoking use included Cigarettes. She  has a 47 pack-year smoking history. She does not have any smokeless tobacco history on file. She reports that she drinks alcohol. She reports that she does not use illicit drugs.  Current Outpatient Prescriptions on File Prior to Visit  Medication Sig Dispense Refill  . amLODipine (NORVASC) 10 MG tablet Take 1 tablet (10 mg total) by mouth daily. 90 tablet 2  . Calcium Carbonate (GNP CALCIUM) 1500 MG TABS Take 1,500 mg by mouth every evening.     . Cholecalciferol 1000 UNITS tablet Take 1,000 Units by mouth every evening.     . furosemide (LASIX) 40 MG tablet Take 1 tablet (40 mg total) by mouth 2 (two) times daily. 30 tablet 1  . levothyroxine (SYNTHROID, LEVOTHROID) 100 MCG tablet Take 100 mcg by mouth daily before breakfast.     . magnesium 30 MG tablet Take 30 mg by mouth every evening.     . niacin 500 MG tablet Take 500 mg by mouth every evening. otc    . NON FORMULARY Place 1.5 L into the nose continuous. 1.5 liters in office/ 2 liters at home    . potassium chloride SA (K-DUR,KLOR-CON) 20 MEQ tablet Take 1 tablet (20 mEq total) by mouth 2 (two) times daily. 60 tablet 0  . predniSONE (DELTASONE) 10 MG tablet Take 3 tabs daily x 7 days, 2 tabs daily x 7 days, then 1 tab daily x 7 days then stop 42 tablet 0  . vitamin B-12 (CYANOCOBALAMIN) 500 MCG tablet Take 500 mcg by mouth every evening.     . warfarin (COUMADIN) 5 MG tablet Take 1 tablet (5 mg total) by mouth daily. 30 tablet 3   No current facility-administered medications on file prior to visit.  Objective:  Objective Physical Exam  Constitutional: She is oriented to person, place, and time. She appears well-developed and well-nourished.  HENT:  Head: Normocephalic and atraumatic.  Eyes: Conjunctivae and EOM are normal.  Neck: Normal range of motion. Neck supple. No JVD present. Carotid bruit is not present. No thyromegaly present.  Cardiovascular: Normal rate, regular rhythm and normal heart sounds.   No murmur  heard. Pulmonary/Chest: Effort normal and breath sounds normal. No respiratory distress. She has no wheezes. She has no rales. She exhibits no tenderness.  Musculoskeletal: She exhibits no edema.  Neurological: She is alert and oriented to person, place, and time.  Psychiatric: She has a normal mood and affect.   BP 122/70 mmHg  Pulse 84  Resp 16  Ht 5\' 6"  (1.676 m)  Wt 174 lb 6.4 oz (79.107 kg)  BMI 28.16 kg/m2  SpO2 97% Wt Readings from Last 3 Encounters:  09/04/15 174 lb 6.4 oz (79.107 kg)  08/31/15 174 lb 9.6 oz (79.198 kg)  08/24/15 179 lb 4 oz (81.307 kg)     Lab Results  Component Value Date   WBC 4.8 08/14/2015   HGB 10.0* 08/14/2015   HCT 30.8* 08/14/2015   PLT 185 08/14/2015   GLUCOSE 113* 08/21/2015   CHOL 148 02/21/2015   TRIG 90 02/21/2015   HDL 47 02/21/2015   LDLCALC 83 02/21/2015   ALT 32 07/03/2007   AST 44* 07/03/2007   NA 138 08/21/2015   K 3.6 08/21/2015   CL 96 08/21/2015   CREATININE 1.04 08/21/2015   BUN 32* 08/21/2015   CO2 35* 08/21/2015   TSH 1.011 08/13/2015   INR 3.6 09/04/2015    No results found.   Assessment & Plan:  Plan I am having Colleen [name] maintain her Calcium Carbonate, magnesium, levothyroxine, vitamin B-12, niacin, Cholecalciferol, potassium chloride SA, furosemide, NON FORMULARY, amLODipine, warfarin, and predniSONE.  No orders of the defined types were placed in this encounter.    Problem List Items Addressed This Visit      Unprioritized   Multiple lung nodules on CT - Primary    Being followed by pulmonary      Hypothyroidism    Lab Results  Component Value Date   TSH 1.011 08/13/2015   con't synthroid Recheck cpe      Chronic respiratory failure    Per pulm Pt on prednisone--- weaning off      Chronic diastolic CHF (congestive heart failure)    con't lasix Pt doing well with weights No sob      AKI (acute kidney injury)    . Lab Results  Component Value Date   CREATININE 1.04 08/21/2015             Follow-up: Return if symptoms worsen or fail to improve.  Garnet Koyanagi, DO

## 2015-09-04 NOTE — Assessment & Plan Note (Signed)
con't lasix Pt doing well with weights No sob

## 2015-09-04 NOTE — Patient Instructions (Signed)

## 2015-09-05 ENCOUNTER — Encounter (HOSPITAL_COMMUNITY): Payer: Self-pay

## 2015-09-05 ENCOUNTER — Ambulatory Visit (HOSPITAL_COMMUNITY)
Admission: RE | Admit: 2015-09-05 | Discharge: 2015-09-05 | Disposition: A | Discharge status: Home / Self Care | Payer: Medicare Other | Source: Ambulatory Visit | Attending: Cardiology | Admitting: Cardiology

## 2015-09-05 VITALS — BP 128/70 | HR 78 | Wt 173.0 lb

## 2015-09-05 DIAGNOSIS — Z9981 Dependence on supplemental oxygen: Secondary | ICD-10-CM | POA: Diagnosis not present

## 2015-09-05 DIAGNOSIS — I272 Other secondary pulmonary hypertension: Secondary | ICD-10-CM | POA: Diagnosis not present

## 2015-09-05 DIAGNOSIS — Z7901 Long term (current) use of anticoagulants: Secondary | ICD-10-CM | POA: Insufficient documentation

## 2015-09-05 DIAGNOSIS — I10 Essential (primary) hypertension: Secondary | ICD-10-CM | POA: Diagnosis not present

## 2015-09-05 DIAGNOSIS — J9611 Chronic respiratory failure with hypoxia: Secondary | ICD-10-CM | POA: Diagnosis not present

## 2015-09-05 DIAGNOSIS — Z79899 Other long term (current) drug therapy: Secondary | ICD-10-CM | POA: Diagnosis not present

## 2015-09-05 DIAGNOSIS — E785 Hyperlipidemia, unspecified: Secondary | ICD-10-CM | POA: Insufficient documentation

## 2015-09-05 DIAGNOSIS — Z8673 Personal history of transient ischemic attack (TIA), and cerebral infarction without residual deficits: Secondary | ICD-10-CM | POA: Diagnosis not present

## 2015-09-05 DIAGNOSIS — I480 Paroxysmal atrial fibrillation: Secondary | ICD-10-CM | POA: Diagnosis not present

## 2015-09-05 DIAGNOSIS — Z87891 Personal history of nicotine dependence: Secondary | ICD-10-CM | POA: Insufficient documentation

## 2015-09-05 DIAGNOSIS — E039 Hypothyroidism, unspecified: Secondary | ICD-10-CM | POA: Diagnosis not present

## 2015-09-05 DIAGNOSIS — I5032 Chronic diastolic (congestive) heart failure: Secondary | ICD-10-CM | POA: Diagnosis present

## 2015-09-05 DIAGNOSIS — J9610 Chronic respiratory failure, unspecified whether with hypoxia or hypercapnia: Secondary | ICD-10-CM | POA: Insufficient documentation

## 2015-09-05 LAB — BASIC METABOLIC PANEL
ANION GAP: 8 (ref 5–15)
BUN: 20 mg/dL (ref 6–20)
CALCIUM: 9.2 mg/dL (ref 8.9–10.3)
CO2: 30 mmol/L (ref 22–32)
Chloride: 99 mmol/L — ABNORMAL LOW (ref 101–111)
Creatinine, Ser: 1.03 mg/dL — ABNORMAL HIGH (ref 0.44–1.00)
GFR, EST AFRICAN AMERICAN: 59 mL/min — AB (ref >60)
GFR, EST NON AFRICAN AMERICAN: 51 mL/min — AB (ref >60)
GLUCOSE: 159 mg/dL — AB (ref 65–99)
Potassium: 4.2 mmol/L (ref 3.5–5.1)
Sodium: 137 mmol/L (ref 135–145)

## 2015-09-05 NOTE — Progress Notes (Signed)
Patient ID: Colleen [name], female   DOB: 1935/12/11, 80 y.o.   MRN: [id number] PCP: Dr [name] Primary HF Cardiologist: Dr Aundra Dubin  Pulmonary: Dr Ashok Cordia  HPI: Colleen [name] is a 80 yo female with PMHx of diastolic CHF (echo 8/0/88 EF 65-70%), HTN, hypothyroidism, and paroxysmal atrial fibrillation.   Admitted to [facility] 07/30/15 with complaint of DOE, weight gain, orthopnea and increased edema. Diuresed with IV lasix and transitioned to lasix 40 mg twice a day. Hospital course compliacted by hypoxic respiratory failure. CT of chest negative for PE but she had bilateral lung infiltrates concerning for inflammatory lung disease.  Therefore, placed on solumedrol then transtioned to prednisone Prinizide was stopped.  Discharge weight was 171 pounds.    At last visit, she was noted to be in rate-controlled atrial fibrillation and was started on coumadin. Today she is back in NSR.   She returns for follow up.  Last visit,  Lasix was increased for 4 days then she went back to 40 mg twice a day. Overall feeling better. Breathing much better. More energy.  Weight down 6 lbs.  Denies PND/orthopnea.  Able to walk up steps.  Weaning off prednisone. No bleeding problems. Only wearing oxygen at night. Following low salt diet. Limiting fluid intake.   She saw pulmonary.  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  She will be getting another high resolution CT chest.   RHC 08/14/2015  RA mean 20 RV 72/20 PA 72/27, mean 39 PCWP mean 29, prominent V-waves Oxygen saturations: PA 65% AO 97% Cardiac Output (Fick) 5.75  Cardiac Index (Fick) 3.06  PVR 1.7 WU  Labs 8/29//2016: K 3.6 Creatinine 1.04 , hemoglobin 10  ECG: NSR, left axis deviation.   ROS: All systems negative except as listed in HPI, PMH and Problem List.  SH:  Social History   Social History  . Marital Status: Married    Spouse Name: N/A  . Number of Children: 2  . Years of Education: N/A   Occupational History  . retired    Social History Main  Topics  . Smoking status: Former Smoker -- 1.00 packs/day for 47 years    Types: Cigarettes    Quit date: 12/24/1999  . Smokeless tobacco: Not on file  . Alcohol Use: 0.0 oz/week    0 Standard drinks or equivalent per week     Comment: 1 glass of wine rarely  . Drug Use: No  . Sexual Activity: Not on file   Other Topics Concern  . Not on file   Social History Narrative   Originally she is from [place]. She has previously lived in [place] & moved to [place] in 1985. 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 No mold or hot tub exposure. No bird exposure.     FH: No premature CAD  Past Medical History  Diagnosis Date  . Hyperlipidemia   . Hypertension   . Hypothyroidism   . TIA (transient ischemic attack) 2001  . Carotid artery disease   . Shortness of breath   . CHF (congestive heart failure)   . Atrial fibrillation     Current Outpatient Prescriptions  Medication Sig Dispense Refill  . amLODipine (NORVASC) 10 MG tablet Take 1 tablet (10 mg total) by mouth daily. 90 tablet 2  . Calcium Carbonate (GNP CALCIUM) 1500 MG TABS Take 1,500 mg by mouth every evening.     . Cholecalciferol 1000 UNITS tablet Take 1,000 Units by mouth every evening.     Marland Kitchen  furosemide (LASIX) 40 MG tablet Take 1 tablet (40 mg total) by mouth 2 (two) times daily. 30 tablet 1  . levothyroxine (SYNTHROID, LEVOTHROID) 100 MCG tablet Take 100 mcg by mouth daily before breakfast.     . magnesium 30 MG tablet Take 30 mg by mouth every evening.     . niacin 500 MG tablet Take 500 mg by mouth every evening. otc    . NON FORMULARY Place 1.5 L into the nose continuous. 1.5 liters in office/ 2 liters at home    . potassium chloride SA (K-DUR,KLOR-CON) 20 MEQ tablet Take 1 tablet (20 mEq total) by mouth 2 (two) times daily. 60 tablet 0  . predniSONE (DELTASONE) 10 MG tablet Take 3 tabs daily x 7 days, 2 tabs daily x 7 days, then 1 tab daily x 7 days then stop 42 tablet 0  . vitamin B-12 (CYANOCOBALAMIN)  500 MCG tablet Take 500 mcg by mouth every evening.     . warfarin (COUMADIN) 5 MG tablet Take 1 tablet (5 mg total) by mouth daily. 30 tablet 3   No current facility-administered medications for this encounter.    Filed Vitals:   09/05/15 1138  BP: 128/70  Pulse: 78  Weight: 173 lb (78.472 kg)  SpO2: 97%    PHYSICAL EXAM:  General:  Well appearing. No resp difficulty  HEENT: normal Neck: supple. JVP 6-7 . Carotids 2+ bilaterally; no bruits. No lymphadenopathy or thryomegaly appreciated. Cor: PMI normal. Irregular.  & rhythm. No rubs, gallops or murmurs. Lungs: clear Abdomen: soft, nontender, nondistended. No hepatosplenomegaly. No bruits or masses. Good bowel sounds. Extremities: no cyanosis, clubbing, rash,  R and LLE trace edema.  Neuro: alert & orientedx3, cranial nerves grossly intact. Moves all 4 extremities w/o difficulty. Affect pleasant.  ASSESSMENT & PLAN:  1. Chronic  Respiratory Failure: Pulmonary work up - CT showed infiltrates of unknown etiology with concern for inflammatory lung disease. ECHO with bubble study negative for cardiac or pulmonary shunt.  PCCM suspects underlying inflammatory process that is autoimmune in etiology given her elevated ESR & rheumatoid factor. ANA negative.  - Tapering off prednisone.   - Has followup high resolution CT chest.  - Remains on home oxygen at night.  2. Chronic Diastolic CHF: EF normal at 65-70%, RV mildly dilated. RHC with elevated filling pressures noted as well as pulmonary venous hypertension.  NYHA II. Volume status improved with weight down.  - Continue lasix 40 mg twice a day + 20 meq K twice daily.  BMET today.  3. HTN: controlled. Continue current regimen.  4. H/o accelerated junctional rhythm 5. Atrial fibrillation: Paroxysmal, CHADSVASC Score 4.  She is in NSR today.  She did not require nodal blockade while she was in atrial fibrillation.  On coumadin. [facility]  [address] to manage INR.    Follow up in 8 weeks. Check BMET today   CLEGG,AMY NP-C  11:47 AM   Patient seen with NP, agree with the above note.  Volume status is much improved with weight down 6 lbs.  Continue Lasix 40 mg bid.  She will need a BMET.  Of note, she is back in NSR this visit.  Continue coumadin.   Loralie Champagne 09/05/2015

## 2015-09-05 NOTE — Patient Instructions (Addendum)
Labs today   TAKE Lasix 40 mg twice daily.  8 week follow up with Dr. Aundra Dubin.

## 2015-09-09 ENCOUNTER — Other Ambulatory Visit: Payer: Self-pay | Admitting: Physician Assistant

## 2015-09-09 DIAGNOSIS — I5032 Chronic diastolic (congestive) heart failure: Secondary | ICD-10-CM

## 2015-09-09 MED ORDER — POTASSIUM CHLORIDE CRYS ER 20 MEQ PO TBCR: 20.0000 meq | EXTENDED_RELEASE_TABLET | Freq: Two times a day (BID) | ORAL | Status: DC

## 2015-09-09 MED ORDER — FUROSEMIDE 40 MG PO TABS
40.0000 mg | ORAL_TABLET | Freq: Two times a day (BID) | ORAL | Status: DC
Start: 2015-09-09 — End: 2015-09-26

## 2015-09-11 ENCOUNTER — Ambulatory Visit (INDEPENDENT_AMBULATORY_CARE_PROVIDER_SITE_OTHER): Payer: Medicare Other | Admitting: Pharmacist Clinician (PhC)/ Clinical Pharmacy Specialist

## 2015-09-11 ENCOUNTER — Ambulatory Visit (INDEPENDENT_AMBULATORY_CARE_PROVIDER_SITE_OTHER)
Admission: RE | Admit: 2015-09-11 | Discharge: 2015-09-11 | Disposition: A | Discharge status: Home / Self Care | Payer: Medicare Other | Source: Ambulatory Visit | Attending: Pulmonary Disease | Admitting: Pulmonary Disease

## 2015-09-11 DIAGNOSIS — I480 Paroxysmal atrial fibrillation: Secondary | ICD-10-CM | POA: Diagnosis not present

## 2015-09-11 DIAGNOSIS — Z7901 Long term (current) use of anticoagulants: Secondary | ICD-10-CM | POA: Diagnosis not present

## 2015-09-11 DIAGNOSIS — R918 Other nonspecific abnormal finding of lung field: Secondary | ICD-10-CM | POA: Diagnosis not present

## 2015-09-11 LAB — POCT INR: INR: 2.5

## 2015-09-15 ENCOUNTER — Telehealth: Payer: Self-pay | Admitting: Pulmonary Disease

## 2015-09-15 NOTE — Telephone Encounter (Signed)
Called spoke with pt. Made her aware when she comes in for OV we are doing 47mw to determine if she still needs the O2. If not, then we can d/c then. Nothing further needed

## 2015-09-18 ENCOUNTER — Ambulatory Visit (INDEPENDENT_AMBULATORY_CARE_PROVIDER_SITE_OTHER): Payer: Medicare Other | Admitting: Pharmacist Clinician (PhC)/ Clinical Pharmacy Specialist

## 2015-09-18 DIAGNOSIS — I480 Paroxysmal atrial fibrillation: Secondary | ICD-10-CM | POA: Diagnosis not present

## 2015-09-18 DIAGNOSIS — Z7901 Long term (current) use of anticoagulants: Secondary | ICD-10-CM

## 2015-09-18 LAB — POCT INR: INR: 2.3

## 2015-09-18 MED ORDER — METOPROLOL TARTRATE 25 MG PO TABS: 12.5000 mg | ORAL_TABLET | Freq: Every day | ORAL | Status: DC

## 2015-09-18 NOTE — Progress Notes (Signed)
Patient in office for INR check, was having rapid heart rate and some DOE.  Also complained about increase in swelling of her feet, enough that she could not get shoes on last night to go to church.   RN did EKG and patient was in NSR with HR of 91.  She previously had been on atenolol, but that was discontinued in March, during hospitalization, because of bradycardia.   Reviewed EKG and information with Rosaria Ferries PA.  It was decided that most of her bradycardia noted in the chart occurred during night hours.  Therefore we will start her on metoprolol tartrated 12.5 mg once daily in the mornings.  She will continue to watch her HR at home with her pulse/ox.   As for the swelling, I have increased her furosemide from 40 mg bid to 80 mg am/40 mg pm for 3 days then she is to resume 40 mg bid.  She will take an extra potassium tablet for those 3 days as well.  If the swelling does not go down, she is to either call Dr. Kennon Holter RN or the HF clinic.    Reviewed this information with patient, she voices understanding.

## 2015-09-18 NOTE — Patient Instructions (Signed)
Start taking metoprolol tartrate 12.5 mg once daily in the mornings (1/2 of 25 mg tablet).  Continue checking heart rate and BP if able  Take 80 mg furosemide (2 tablets) each morning and 40 mg each afternoon for 3 days, then resume with 1 tablet twice daily.  Take extra potassium tablet each morning as well.  If swelling continues please call HF clinic or [place]

## 2015-09-26 ENCOUNTER — Telehealth (HOSPITAL_COMMUNITY): Payer: Self-pay | Admitting: Cardiology

## 2015-09-26 DIAGNOSIS — I4891 Unspecified atrial fibrillation: Secondary | ICD-10-CM

## 2015-09-26 DIAGNOSIS — I5022 Chronic systolic (congestive) heart failure: Secondary | ICD-10-CM

## 2015-09-26 MED ORDER — FUROSEMIDE 40 MG PO TABS: 60.0000 mg | ORAL_TABLET | Freq: Two times a day (BID) | ORAL | Status: DC

## 2015-09-26 MED ORDER — METOPROLOL SUCCINATE ER 25 MG PO TB24: 25.0000 mg | ORAL_TABLET | Freq: Every day | ORAL | Status: DC

## 2015-09-26 NOTE — Telephone Encounter (Signed)
PATIENT CALLED REGARDING INCREASED HR AND INCREASED SOB WEIGHT STILL UP ~3 LBS (175 LBS) HR 111-118  PATIENT WAS RECENTLY SEEN IN COUMADIN CLINIC AND WAS TACHY AT THIS VISIT PATIENT WAS STARTED ON METOPROLOL 12.5 MG DAILY LASIX WAS ALSO INCREASED TO 80 MG IN AM AND 40 IN THE PM X 3 DAYS AND RESUME 40 BID  PLEASE ADVISE AS PT IS STILL CONCERNED REGARDING HR

## 2015-09-26 NOTE — Telephone Encounter (Signed)
Pt aware and voiced understanding, will return for labs 09/28/15 Labs x 1 week 10/04/15 Return to see MD x 2-3 weeks

## 2015-09-26 NOTE — Telephone Encounter (Signed)
Start Toprol XL 25 mg bid (don't use metoprolol tartrate).  She also needs to come by for ECG to document atrial fibrillation.  She can increase Lasix to 60 mg bid.  Needs BMET in 1 week.  Needs followup in office. In the next 7-10 after these changes are made.

## 2015-09-28 ENCOUNTER — Encounter (HOSPITAL_COMMUNITY): Payer: Self-pay

## 2015-09-28 ENCOUNTER — Ambulatory Visit (HOSPITAL_COMMUNITY)
Admission: RE | Admit: 2015-09-28 | Discharge: 2015-09-28 | Disposition: A | Discharge status: Home / Self Care | Payer: Medicare Other | Source: Ambulatory Visit | Attending: Cardiology | Admitting: Cardiology

## 2015-09-28 DIAGNOSIS — R9431 Abnormal electrocardiogram [ECG] [EKG]: Secondary | ICD-10-CM | POA: Insufficient documentation

## 2015-09-28 DIAGNOSIS — I509 Heart failure, unspecified: Secondary | ICD-10-CM | POA: Insufficient documentation

## 2015-09-28 DIAGNOSIS — I5022 Chronic systolic (congestive) heart failure: Secondary | ICD-10-CM | POA: Diagnosis not present

## 2015-10-02 ENCOUNTER — Ambulatory Visit (INDEPENDENT_AMBULATORY_CARE_PROVIDER_SITE_OTHER): Payer: Medicare Other | Admitting: Pharmacist Clinician (PhC)/ Clinical Pharmacy Specialist

## 2015-10-02 DIAGNOSIS — Z7901 Long term (current) use of anticoagulants: Secondary | ICD-10-CM

## 2015-10-02 DIAGNOSIS — I480 Paroxysmal atrial fibrillation: Secondary | ICD-10-CM | POA: Diagnosis not present

## 2015-10-02 LAB — POCT INR: INR: 1.6

## 2015-10-03 ENCOUNTER — Ambulatory Visit: Payer: Medicare Other | Admitting: Pulmonary Disease

## 2015-10-03 ENCOUNTER — Ambulatory Visit: Payer: Medicare Other

## 2015-10-04 ENCOUNTER — Other Ambulatory Visit (HOSPITAL_COMMUNITY): Payer: Medicare Other

## 2015-10-05 ENCOUNTER — Ambulatory Visit (HOSPITAL_COMMUNITY)
Admission: RE | Admit: 2015-10-05 | Discharge: 2015-10-05 | Disposition: A | Discharge status: Home / Self Care | Payer: Medicare Other | Source: Ambulatory Visit | Attending: Cardiology | Admitting: Cardiology

## 2015-10-05 DIAGNOSIS — I5032 Chronic diastolic (congestive) heart failure: Secondary | ICD-10-CM | POA: Insufficient documentation

## 2015-10-05 DIAGNOSIS — I5031 Acute diastolic (congestive) heart failure: Secondary | ICD-10-CM

## 2015-10-05 LAB — BASIC METABOLIC PANEL
ANION GAP: 10 (ref 5–15)
BUN: 8 mg/dL (ref 6–20)
CALCIUM: 9.3 mg/dL (ref 8.9–10.3)
CHLORIDE: 98 mmol/L — AB (ref 101–111)
CO2: 30 mmol/L (ref 22–32)
Creatinine, Ser: 0.89 mg/dL (ref 0.44–1.00)
GFR calc Af Amer: >60 mL/min (ref >60)
GFR calc non Af Amer: >60 mL/min (ref >60)
GLUCOSE: 154 mg/dL — AB (ref 65–99)
Potassium: 3.5 mmol/L (ref 3.5–5.1)
Sodium: 138 mmol/L (ref 135–145)

## 2015-10-05 LAB — TSH: TSH: 1.955 u[IU]/mL (ref 0.350–4.500)

## 2015-10-05 LAB — T4, FREE: Free T4: 1.14 ng/dL — ABNORMAL HIGH (ref 0.61–1.12)

## 2015-10-06 LAB — T3, FREE: T3, Free: 2.9 pg/mL (ref 2.0–4.4)

## 2015-10-06 LAB — T4: T4, Total: 10.6 ug/dL (ref 4.5–12.0)

## 2015-10-06 LAB — T3: T3 TOTAL: 113 ng/dL (ref 71–180)

## 2015-10-16 ENCOUNTER — Ambulatory Visit (INDEPENDENT_AMBULATORY_CARE_PROVIDER_SITE_OTHER): Payer: Medicare Other | Admitting: Pharmacist Clinician (PhC)/ Clinical Pharmacy Specialist

## 2015-10-16 DIAGNOSIS — I480 Paroxysmal atrial fibrillation: Secondary | ICD-10-CM

## 2015-10-16 DIAGNOSIS — Z7901 Long term (current) use of anticoagulants: Secondary | ICD-10-CM | POA: Diagnosis not present

## 2015-10-16 LAB — POCT INR: INR: 2.1

## 2015-10-19 ENCOUNTER — Ambulatory Visit (INDEPENDENT_AMBULATORY_CARE_PROVIDER_SITE_OTHER): Payer: Medicare Other | Admitting: Pulmonary Disease

## 2015-10-19 ENCOUNTER — Encounter: Payer: Self-pay | Admitting: Pulmonary Disease

## 2015-10-19 ENCOUNTER — Other Ambulatory Visit: Payer: Medicare Other

## 2015-10-19 VITALS — BP 124/58 | HR 84 | Ht 65.0 in | Wt 176.0 lb

## 2015-10-19 DIAGNOSIS — J9611 Chronic respiratory failure with hypoxia: Secondary | ICD-10-CM

## 2015-10-19 DIAGNOSIS — J432 Centrilobular emphysema: Secondary | ICD-10-CM | POA: Diagnosis not present

## 2015-10-19 DIAGNOSIS — J439 Emphysema, unspecified: Secondary | ICD-10-CM | POA: Insufficient documentation

## 2015-10-19 DIAGNOSIS — J984 Other disorders of lung: Secondary | ICD-10-CM | POA: Diagnosis not present

## 2015-10-19 DIAGNOSIS — R0602 Shortness of breath: Secondary | ICD-10-CM

## 2015-10-19 LAB — PULMONARY FUNCTION TEST
DL/VA % pred: 61 %
DL/VA: 3.04 ml/min/mmHg/L
DLCO UNC % PRED: 39 %
DLCO unc: 10.04 ml/min/mmHg
FEF 25-75 PRE: 1.09 L/s
FEF 25-75 Post: 0.94 L/sec
FEF2575-%CHANGE-POST: -13 %
FEF2575-%PRED-POST: 60 %
FEF2575-%PRED-PRE: 69 %
FEV1-%CHANGE-POST: -2 %
FEV1-%Pred-Post: 69 %
FEV1-%Pred-Pre: 71 %
FEV1-PRE: 1.5 L
FEV1-Post: 1.46 L
FEV1FVC-%CHANGE-POST: 4 %
FEV1FVC-%PRED-PRE: 100 %
FEV6-%Change-Post: -7 %
FEV6-%PRED-POST: 70 %
FEV6-%Pred-Pre: 75 %
FEV6-PRE: 2.03 L
FEV6-Post: 1.88 L
FEV6FVC-%PRED-PRE: 105 %
FEV6FVC-%Pred-Post: 105 %
FVC-%CHANGE-POST: -6 %
FVC-%PRED-POST: 66 %
FVC-%Pred-Pre: 71 %
FVC-Post: 1.89 L
FVC-Pre: 2.03 L
POST FEV1/FVC RATIO: 78 %
PRE FEV6/FVC RATIO: 100 %
Post FEV6/FVC ratio: 100 %
Pre FEV1/FVC ratio: 74 %
RV % PRED: 72 %
RV: 1.74 L
TLC % pred: 72 %
TLC: 3.75 L

## 2015-10-19 MED ORDER — UMECLIDINIUM BROMIDE 62.5 MCG/INH IN AEPB: 1.0000 | INHALATION_SPRAY | Freq: Every day | RESPIRATORY_TRACT | Status: DC

## 2015-10-19 NOTE — Progress Notes (Signed)
PFT done today. 

## 2015-10-19 NOTE — Patient Instructions (Signed)
1. Use your Incruse inhaler once daily. Call me if this seems to significantly help your shortness of breath. If you have any trouble with this medication such as difficulty urinating, blurry vision, rash, etc. discontinue its use. 2. I'm repeating breathing tests at your next appointment. 3. I'm checking an overnight oxygen saturation on room air through your oxygen company. 4. Please contact my office if you have any new breathing problems before your next appointment.

## 2015-10-19 NOTE — Progress Notes (Signed)
PFT reviewed. Refer to progress note.

## 2015-10-19 NOTE — Progress Notes (Signed)
Subjective:    Patient ID: Colleen [name], female    DOB: 1935/06/07, 80 y.o.   MRN: [id number]  C.C.:  Follow-up for Emphysema, Lung Nodules & Hypoxic Respiratory Failure.  HPI  Emphysema: Mild central lobular emphysema noted on high-resolution CT scan. 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 The last week she has had a cough that she attributes to sinus congestion & drainage.   Lung Nodules: Likely due to autoimmune pathology given significant improvement with steroid therapy. Prior autoimmune workup inconclusive. No definitive ILD on high-resolution CT scan. She finished her steroid taper in late September.   Hypoxic Respiratory Failure:  Patient presently prescribed oxygen at 1 L per minute while sleeping. Patient demonstrated oxygen requirement of 2 L/m with exertion today. She reports 3 weeks ago she had intermittent palpitations with minimal exertion & had to resume oxygen.   Review of Systems She denies any chest pain or pressure. No syncope or near syncope. No fever, chills, or sweats.   No Known Allergies  Current Outpatient Prescriptions on File Prior to Visit  Medication Sig Dispense Refill  . amLODipine (NORVASC) 10 MG tablet Take 1 tablet (10 mg total) by mouth daily. 90 tablet 2  . Calcium Carbonate (GNP CALCIUM) 1500 MG TABS Take 1,500 mg by mouth every evening.     . Cholecalciferol 1000 UNITS tablet Take 1,000 Units by mouth every evening.     . furosemide (LASIX) 40 MG tablet Take 1.5 tablets (60 mg total) by mouth 2 (two) times daily. 90 tablet 5  . levothyroxine (SYNTHROID, LEVOTHROID) 100 MCG tablet Take 100 mcg by mouth daily before breakfast.     . magnesium 30 MG tablet Take 30 mg by mouth every evening.     . metoprolol succinate (TOPROL-XL) 25 MG 24 hr tablet Take 1 tablet (25 mg total) by mouth daily. 60 tablet 6  . niacin 500 MG tablet Take 500 mg by mouth every evening. otc    . NON FORMULARY Place 1.5 L into the nose  continuous. 1.5 liters in office/ 2 liters at home    . potassium chloride SA (K-DUR,KLOR-CON) 20 MEQ tablet Take 1 tablet (20 mEq total) by mouth 2 (two) times daily. 60 tablet 5  . vitamin B-12 (CYANOCOBALAMIN) 500 MCG tablet Take 500 mcg by mouth every evening.     . warfarin (COUMADIN) 5 MG tablet Take 1 tablet (5 mg total) by mouth daily. 30 tablet 3   No current facility-administered medications on file prior to visit.   Past Medical History  Diagnosis Date  . Hyperlipidemia   . Hypertension   . Hypothyroidism   . TIA (transient ischemic attack) 2001  . Carotid artery disease ([place])   . Shortness of breath   . CHF (congestive heart failure) ([place])   . Atrial fibrillation [facility])    Past Surgical History  Procedure Laterality Date  . Cholecystectomy    . Abdominal hysterectomy  1975    TAH,BSO  . Carotid endarterectomy  2001    left  . Doppler echocardiography  02/13/2010    EF>55%  . Doppler echocardiography  09/29/2007  . Nm myoview ltd  02/13/2010  . Carotid duplex  11/24/2012  . Carotid duplex  11/01/2010  . Renal duplex  02/13/2010  . Video bronchoscopy Bilateral 08/09/2015    Procedure: VIDEO BRONCHOSCOPY WITHOUT FLUORO;  Surgeon: Collene Gobble, MD;  Location: [place];  Service: Cardiopulmonary;  Laterality: Bilateral;  . Cardiac catheterization N/A 08/14/2015  Procedure: Right Heart Cath;  Surgeon: Larey Dresser, MD;  Location: [place] [facility];  Service: Cardiovascular;  Laterality: N/A;   Family History  Problem Relation Age of Onset  . Heart failure Mother   . Parkinson's disease Mother   . Arthritis Mother   . Arthritis Sister   . Colon cancer Brother   . Rheumatologic disease Neg Hx   . Lung disease Neg Hx    Social History   Social History  . Marital Status: Married    Spouse Name: N/A  . Number of Children: 2  . Years of Education: N/A   Occupational History  . retired    Social History Main Topics  . Smoking status: Former Smoker --  1.00 packs/day for 47 years    Types: Cigarettes    Quit date: 12/24/1999  . Smokeless tobacco: None  . Alcohol Use: 0.0 oz/week    0 Standard drinks or equivalent per week     Comment: 1 glass of wine rarely  . Drug Use: No  . Sexual Activity: Not Asked   Other Topics Concern  . None   Social History Narrative   Originally she is from [place]. She has previously lived in [place] & moved to [place] in 1985. 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 No mold or hot tub exposure. No bird exposure.       Objective:   Physical Exam Blood pressure 142/64, pulse 88, height 5' 5.5" (1.664 m), weight 174 lb 9.6 oz (79.198 kg), SpO2 94 %. General:  Awake. Alert. Mild central obesity. Accompanied by husband today.  Integument:  Warm & dry. No rash on exposed skin. Mild bruising on bilateral forearms. Lymphatics:  No appreciated cervical or supraclavicular lymphadenoapthy. HEENT:  Moist mucus membranes. No oral ulcers. Mild bilateral nasal turbinate swelling  Cardiovascular: Regular rate & rhythm. Bilateral lower extremity edema. Pulmonary:  Clear bilaterally to auscultation. Normal work of breathing on supplemental oxygen. Musculoskeletal:  Normal bulk and tone. No joint deformity or effusion appreciated.  PFT 10/19/15: FVC 2.03 L (71%) FEV1 1.50 L (71%) FEV1/FVC 0.74 FEF 25-75 1.09 L (69%) no bronchodilator response TLC 3.75 L (72%) RV 72% ERV 12% DLCO uncorrected 39%  08/07/15: FVC 1.72 L (60%) FEV1 1.18 L (86%) FEV1/FVC 0.69FEF 25-75 0.66 L (41%)  6MWT 10/19/15:  Walked 144 meters / Baseline Sat 92% on RA / Nadir Sat 84% on RA (stopped due to hip pain & required 2 L/m on exertion to maintain)  IMAGING HRCT CHEST W/O 09/11/15 (personally reviewed by me): Essentially complete resolution of prior groundglass opacities. No honeycombing changes or intralobular septal thickening to suggest ILD. No pleural effusion or thickening. No pathologic mediastinal adenopathy or mediastinal  mass. No pericardial effusion. Upper lobe predominant centrilobular emphysema noted.  HRCT CHEST W/O 08/07/15 (previously reviewed by me): Mild cardiomegaly without pericardial effusion. Mediastinal adenopathy with largest lymph node measuring 1.2 cm in short axis.Nodularity overall bronchovascular bundles predominantly in the lower lobes.Mild intralobular septal thickening again predominantly in the lower lobes. Trace bilateral pleural effusions.No honeycombing changes. Patchy groundglass opacities appreciated.Nodular contour to the liver suggesting cirrhosis.  CARDIAC TTE W/ BUBBLE STUDY (08/08/15): LV normal in size with normal wall thickness. EF 65-70%. No regional wall motion abnormalities. LA moderately to severely dilated. RA normal in size. No shunt or PFO on bubble study. RV mildly dilated with preserved systolic function. Pulmonary artery systolic pressure 46 mmHg. No mitral stenosis or regurg. IVC 2.3 cm.  LABS  08/21/15 BMP: 138/3.6/96/35/32/1.0/113/10.5  08/08/15 ANA: Negative  08/07/15 HIV: Negative ESR: 98 CRP: 0.7 Procalcitonin: <0.1 RF: 57.8    Assessment & Plan:  80 year old female with underlying emphysema & chronic hypoxic respiratory failure. Patient does have an oxygen requirement of 2 L/m with exertion on her walk test today. Her spirometry has never really improved since previous testing but she still has mild restriction on her lung volumes along with a substantially decreased carbon dioxide diffusion capacity. She does have follow-up appointment with cardiology to  further discuss her palpitations/tachycardia that she is experiencing.  her repeat high-resolution CT scan shows resolution of the lung nodules and I remain suspicious about an underlying autoimmune process despite prior testing. We discussed initiating inhaler therapy for her underlying emphysema, but the patient is highly against this given the potential for increased cost to her already fixed  income.  1. Centrilobular emphysema: Screening for alpha-1 antitrypsin deficiency. Patient given samples for Incruse and instructed on proper technique. Repeat spirometry with DLCO next appointment. 2. Chronic hypoxic respiratory failure: Prescribing the patient oxygen at 2 L/m with exertion. Checking overnight pulse oximetry on room air. Repeat 6 minute walk test with oxygen titration at next appointment. Inquiring regarding portable concentrator for the patient. 3. Mild restrictive lung disease: Likely secondary to underlying cardiomyopathy as well as central obesity. Repeat spirometry with DLCO at next appointment. 4. Lung nodules: Seemed to resolve. Still highly suspicious for underlying autoimmune process. If restriction or spirometry worsen consider repeat chest imaging. 5. Follow-up: Patient to return to clinic in 2-3 months or sooner if needed.

## 2015-10-23 ENCOUNTER — Ambulatory Visit (HOSPITAL_COMMUNITY)
Admission: RE | Admit: 2015-10-23 | Discharge: 2015-10-23 | Disposition: A | Discharge status: Home / Self Care | Payer: Medicare Other | Source: Ambulatory Visit | Attending: Cardiology | Admitting: Cardiology

## 2015-10-23 VITALS — BP 126/64 | HR 84 | Wt 177.0 lb

## 2015-10-23 DIAGNOSIS — E039 Hypothyroidism, unspecified: Secondary | ICD-10-CM | POA: Diagnosis not present

## 2015-10-23 DIAGNOSIS — I480 Paroxysmal atrial fibrillation: Secondary | ICD-10-CM | POA: Diagnosis not present

## 2015-10-23 DIAGNOSIS — I110 Hypertensive heart disease with heart failure: Secondary | ICD-10-CM | POA: Diagnosis not present

## 2015-10-23 DIAGNOSIS — I5032 Chronic diastolic (congestive) heart failure: Secondary | ICD-10-CM | POA: Diagnosis not present

## 2015-10-23 DIAGNOSIS — Z9981 Dependence on supplemental oxygen: Secondary | ICD-10-CM | POA: Diagnosis not present

## 2015-10-23 DIAGNOSIS — E785 Hyperlipidemia, unspecified: Secondary | ICD-10-CM | POA: Insufficient documentation

## 2015-10-23 DIAGNOSIS — Z8673 Personal history of transient ischemic attack (TIA), and cerebral infarction without residual deficits: Secondary | ICD-10-CM | POA: Insufficient documentation

## 2015-10-23 DIAGNOSIS — Z7901 Long term (current) use of anticoagulants: Secondary | ICD-10-CM | POA: Diagnosis not present

## 2015-10-23 DIAGNOSIS — Z87891 Personal history of nicotine dependence: Secondary | ICD-10-CM | POA: Diagnosis not present

## 2015-10-23 DIAGNOSIS — I5022 Chronic systolic (congestive) heart failure: Secondary | ICD-10-CM | POA: Diagnosis not present

## 2015-10-23 DIAGNOSIS — Z79899 Other long term (current) drug therapy: Secondary | ICD-10-CM | POA: Diagnosis not present

## 2015-10-23 DIAGNOSIS — J9610 Chronic respiratory failure, unspecified whether with hypoxia or hypercapnia: Secondary | ICD-10-CM | POA: Diagnosis not present

## 2015-10-23 MED ORDER — LISINOPRIL 10 MG PO TABS: 10.0000 mg | ORAL_TABLET | Freq: Every day | ORAL | Status: DC

## 2015-10-23 MED ORDER — FUROSEMIDE 40 MG PO TABS: 80.0000 mg | ORAL_TABLET | Freq: Two times a day (BID) | ORAL | Status: DC

## 2015-10-23 MED ORDER — ATENOLOL 25 MG PO TABS: 12.5000 mg | ORAL_TABLET | Freq: Every day | ORAL | Status: DC

## 2015-10-23 MED ORDER — POTASSIUM CHLORIDE CRYS ER 20 MEQ PO TBCR: EXTENDED_RELEASE_TABLET | ORAL | Status: DC

## 2015-10-23 NOTE — Patient Instructions (Signed)
CHANGE Lasix to 80 mg, 2 tabs twice a day CHANGE Potassium to 40 mg eQ ( 2 tabs ) in the AM and 20 meQ (1 tab) in the PM STOP AMLODIPINE AND TOPROL START Lisinopril 10 mg, one tab daily START Atenolol 12.5 mg, one tab daily   Your physician recommends that you schedule a follow-up appointment in: 2 weeks with labs (BMET)  Do the following things EVERYDAY: 1) Weigh yourself in the morning before breakfast. Write it down and keep it in a log. 2) Take your medicines as prescribed 3) Eat low salt foods-Limit salt (sodium) to 2000 mg per day.  4) Stay as active as you can everyday 5) Limit all fluids for the day to less than 2 liters 6)

## 2015-10-23 NOTE — Progress Notes (Signed)
Advanced Heart Failure Medication Review by a Pharmacist  Does the patient  feel that his/her medications are working for him/her?  yes  Has the patient been experiencing any side effects to the medications prescribed?  yes  Does the patient measure his/her own blood pressure or blood glucose at home?  yes   Does the patient have any problems obtaining medications due to transportation or finances?   yes  Understanding of regimen: good Understanding of indications: good Potential of compliance: good Patient understands to avoid NSAIDs. Patient understands to avoid decongestants.  Issues to address at subsequent visits: None   Pharmacist comments:  Colleen [name] is a pleasant 80 yo F presenting with her husband and without a medication list. She is able to verbalize all of her medications to me including dosages. She reports good compliance with her medications but does state that she has been having ankle/foot swelling since being switched from lisinopril/hctz to amlodipine. She states that she does not have Rx insurance coverage and since she never had Medicare Part D she would have to pay a fine for the last 12 years so she is not interested in obtaining coverage at this time.   Ruta Hinds. Velva Harman, PharmD, BCPS, CPP Clinical Pharmacist Pager: [phone] Phone: [phone] 10/23/2015 2:23 PM    Time with patient: 8 minutes Preparation and documentation time: 2 minutes Total time: 10 minutes

## 2015-10-24 NOTE — Progress Notes (Signed)
Patient ID: Colleen [name], female   DOB: 03/16/1935, 79 y.o.   MRN: [id number] PCP: Dr Lowne Primary HF Cardiologist: Dr McLean  Pulmonary: Dr Nestor  HPI: Colleen [name] is a 79 yo female with PMHx of diastolic CHF (echo 07/28/15 EF 65-70%), HTN, hypothyroidism, and paroxysmal atrial fibrillation.   [facility]/15 with complaint of DOE, weight gain, orthopnea and increased edema. Diuresed with IV lasix and transitioned to lasix 40 mg twice a day. Hospital course compliacted by hypoxic respiratory failure. CT of chest negative for PE but she had bilateral lung infiltrates concerning for inflammatory lung disease.  Therefore, placed on solumedrol then transtioned to prednisone Prinizide was stopped.  Discharge weight was 171 pounds.    At a prior visit, she was noted to be in rate-controlled atrial fibrillation and was started on coumadin. At last visit and today, she has been in NSR.    She saw pulmonary.  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   She returns for follow up.  Off prednisone.  Overall, seems to be doing worse.  Weight is up 4 lbs.  She has had to go back on oxygen at all times.  She has developed significant lower extremity edema since starting amlodipine.  She is short of breath walking short distances or going up steps.  Does ok generally if she walks slowly.  + orthopnea, +bendopnea.  No chest pain.  No tachypalpitations.  No lightheadedness.  She also says that Toprol XL is too expensive.   RHC 08/14/2015  RA mean 20 RV 72/20 PA 72/27, mean 39 PCWP mean 29, prominent V-waves Oxygen saturations: PA 65% AO 97% Cardiac Output (Fick) 5.75  Cardiac Index (Fick) 3.06  PVR 1.7 WU  Labs (08/21/2015): K 3.6 Creatinine 1.04 , hemoglobin 10 Labs (10/16): K 3.5, creatinine 0.89  ROS: All systems negative except as listed in HPI, PMH and Problem List.  SH:  Social History   Social History  . Marital Status: Married    Spouse Name: N/A  . Number of Children: 2  . Years of  Education: N/A   Occupational History  . retired    Social History Main Topics  . Smoking status: Former Smoker -- 1.00 packs/day for 47 years    Types: Cigarettes    Quit date: 12/24/1999  . Smokeless tobacco: Not on file  . Alcohol Use: 0.0 oz/week    0 Standard drinks or equivalent per week     Comment: 1 glass of wine rarely  . Drug Use: No  . Sexual Activity: Not on file   Other Topics Concern  . Not on file   Social History Narrative   Originally she is from [place]. She has previously lived in [place] & moved to [place] in 1985. 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 Has cats currently. No mold or hot tub exposure. No bird exposure.     FH: No premature CAD  Past Medical History  Diagnosis Date  . Hyperlipidemia   . Hypertension   . Hypothyroidism   . TIA (transient ischemic attack) 2001  . Carotid artery disease ([facility])   . Shortness of breath   . CHF (congestive heart failure) ([facility])   . Atrial fibrillation ([facility])     Current Outpatient Prescriptions  Medication Sig Dispense Refill  . Calcium Carbonate (GNP CALCIUM) 1500 MG TABS Take 1,500 mg by mouth every evening.     . Cholecalciferol 1000 UNITS tablet Take 1,000 Units by mouth every evening.     .   furosemide (LASIX) 40 MG tablet Take 2 tablets (80 mg total) by mouth 2 (two) times daily. 120 tablet 5  . levothyroxine (SYNTHROID, LEVOTHROID) 100 MCG tablet Take 100 mcg by mouth daily before breakfast.     . magnesium 30 MG tablet Take 30 mg by mouth every evening.     . niacin 500 MG tablet Take 500 mg by mouth every evening. otc    . NON FORMULARY Place 1.5 L into the nose continuous. 1.5 liters in office/ 2 liters at home    . potassium chloride SA (K-DUR,KLOR-CON) 20 MEQ tablet Take 2 tabs (40 meQ) in the am and 1 tab in the PM (20 meq) 90 tablet 5  . Umeclidinium Bromide (INCRUSE ELLIPTA) 62.5 MCG/INH AEPB Inhale 1 puff into the lungs daily. 2 each 0  . vitamin B-12 (CYANOCOBALAMIN) 500 MCG tablet Take 500 mcg  by mouth every evening.     . warfarin (COUMADIN) 5 MG tablet Take 1 tablet (5 mg total) by mouth daily. 30 tablet 3  . atenolol (TENORMIN) 25 MG tablet Take 0.5 tablets (12.5 mg total) by mouth daily. 15 tablet 6  . lisinopril (ZESTRIL) 10 MG tablet Take 1 tablet (10 mg total) by mouth daily. 30 tablet 6   No current facility-administered medications for this encounter.    Filed Vitals:   10/23/15 1404  BP: 126/64  Pulse: 84  Weight: 177 lb (80.287 kg)  SpO2: 93%    PHYSICAL EXAM:  General:  Well appearing. No resp difficulty  HEENT: normal Neck: supple. JVP 9-10. Carotids 2+ bilaterally; no bruits. No lymphadenopathy or thryomegaly appreciated. Cor: PMI normal. Regular rate & rhythm. No rubs, gallops or murmurs. Lungs: clear Abdomen: soft, nontender, nondistended. No hepatosplenomegaly. No bruits or masses. Good bowel sounds. Extremities: no cyanosis, clubbing, rash,  1+ edema to knees bilaterally.  Neuro: alert & orientedx3, cranial nerves grossly intact. Moves all 4 extremities w/o difficulty. Affect pleasant.  ASSESSMENT & PLAN:  1. Chronic  Respiratory Failure: Pulmonary work up => CT showed infiltrates of unknown etiology with concern for inflammatory lung disease. ECHO with bubble study negative for cardiac or pulmonary shunt.  PCCM suspects underlying inflammatory process that is autoimmune in etiology given her elevated ESR & rheumatoid factor. ANA negative.  - She is now off prednisone.   - She will get a followup high resolution CT chest.  - She remains on home oxygen.  2. Chronic Diastolic CHF: EF normal at 65-70%, RV mildly dilated. RHC with elevated filling pressures noted as well as pulmonary venous hypertension.  NYHA III. She looks volume overloaded today and is symptomatically worse. - Increase Lasix to 80 mg bid.  Increase KCl to 40 qam/20 qpm.  BMET in 2 wks.   3. HTN: Amlodipine may be contributing to peripheral edema.  Will stop amlodipine and start  lisinopril 10 mg daily.   4. H/o accelerated junctional rhythm 5. Atrial fibrillation: Paroxysmal, CHADSVASC Score 4.  She is in NSR today by exam.  Toprol XL is too expensive, will put her back on atenolol 12.5 mg daily.  On coumadin. [facility]  [address] to manage INR.   Followup in 2 wks.   Dalton McLean 10/24/2015  

## 2015-10-25 LAB — ALPHA-1 ANTITRYPSIN PHENOTYPE: A1 ANTITRYPSIN: 154 mg/dL (ref 83–199)

## 2015-10-30 ENCOUNTER — Ambulatory Visit (INDEPENDENT_AMBULATORY_CARE_PROVIDER_SITE_OTHER): Payer: Medicare Other | Admitting: Pharmacist Clinician (PhC)/ Clinical Pharmacy Specialist

## 2015-10-30 DIAGNOSIS — I480 Paroxysmal atrial fibrillation: Secondary | ICD-10-CM | POA: Diagnosis not present

## 2015-10-30 DIAGNOSIS — Z7901 Long term (current) use of anticoagulants: Secondary | ICD-10-CM

## 2015-10-30 LAB — POCT INR: INR: 1.7

## 2015-11-06 ENCOUNTER — Encounter (HOSPITAL_COMMUNITY): Payer: Self-pay

## 2015-11-06 ENCOUNTER — Telehealth: Payer: Self-pay | Admitting: Pulmonary Disease

## 2015-11-06 ENCOUNTER — Ambulatory Visit (HOSPITAL_COMMUNITY)
Admission: RE | Admit: 2015-11-06 | Discharge: 2015-11-06 | Disposition: A | Discharge status: Home / Self Care | Payer: Medicare Other | Source: Ambulatory Visit | Attending: Cardiology | Admitting: Cardiology

## 2015-11-06 VITALS — BP 106/60 | HR 73 | Wt 171.5 lb

## 2015-11-06 DIAGNOSIS — E039 Hypothyroidism, unspecified: Secondary | ICD-10-CM | POA: Insufficient documentation

## 2015-11-06 DIAGNOSIS — Z8673 Personal history of transient ischemic attack (TIA), and cerebral infarction without residual deficits: Secondary | ICD-10-CM | POA: Insufficient documentation

## 2015-11-06 DIAGNOSIS — I110 Hypertensive heart disease with heart failure: Secondary | ICD-10-CM | POA: Insufficient documentation

## 2015-11-06 DIAGNOSIS — E785 Hyperlipidemia, unspecified: Secondary | ICD-10-CM | POA: Diagnosis not present

## 2015-11-06 DIAGNOSIS — Z87891 Personal history of nicotine dependence: Secondary | ICD-10-CM | POA: Diagnosis not present

## 2015-11-06 DIAGNOSIS — Z79899 Other long term (current) drug therapy: Secondary | ICD-10-CM | POA: Diagnosis not present

## 2015-11-06 DIAGNOSIS — I5032 Chronic diastolic (congestive) heart failure: Secondary | ICD-10-CM

## 2015-11-06 DIAGNOSIS — J9610 Chronic respiratory failure, unspecified whether with hypoxia or hypercapnia: Secondary | ICD-10-CM | POA: Insufficient documentation

## 2015-11-06 DIAGNOSIS — I480 Paroxysmal atrial fibrillation: Secondary | ICD-10-CM | POA: Diagnosis not present

## 2015-11-06 DIAGNOSIS — Z7901 Long term (current) use of anticoagulants: Secondary | ICD-10-CM | POA: Insufficient documentation

## 2015-11-06 LAB — BASIC METABOLIC PANEL
Anion gap: 11 (ref 5–15)
BUN: 13 mg/dL (ref 6–20)
CALCIUM: 9.9 mg/dL (ref 8.9–10.3)
CO2: 25 mmol/L (ref 22–32)
Chloride: 101 mmol/L (ref 101–111)
Creatinine, Ser: 0.96 mg/dL (ref 0.44–1.00)
GFR calc Af Amer: >60 mL/min (ref >60)
GFR calc non Af Amer: 56 mL/min — ABNORMAL LOW (ref >60)
GLUCOSE: 113 mg/dL — AB (ref 65–99)
Potassium: 4.4 mmol/L (ref 3.5–5.1)
Sodium: 137 mmol/L (ref 135–145)

## 2015-11-06 LAB — BRAIN NATRIURETIC PEPTIDE: B Natriuretic Peptide: 72.1 pg/mL (ref 0.0–100.0)

## 2015-11-06 NOTE — Telephone Encounter (Signed)
Overnight Pulse Ox 10/26/15:  Performed on RA. 8:11:28 of analyzed data. 90 desaturation events. Nadir saturation 75%. 479.2 minutes with saturation <88%.

## 2015-11-06 NOTE — Patient Instructions (Addendum)
Routine lab work today. (bmet bnp) Will notify you of abnormal results   FOLLOW UP: 2 months  Do the following things EVERYDAY: 1) Weigh yourself in the morning before breakfast. Write it down and keep it in a log. 2) Take your medicines as prescribed 3) Eat low salt foods-Limit salt (sodium) to 2000 mg per day.  4) Stay as active as you can everyday 5) Limit all fluids for the day to less than 2 liters

## 2015-11-06 NOTE — Telephone Encounter (Signed)
Spoke with patient regarding overnight pulse ox results. She reports her CHF medications have been adjusted & she has lost 6 pounds, is breathing better, and has less edema. As such I am repeating her overnight pulse ox on room air.

## 2015-11-06 NOTE — Progress Notes (Signed)
Patient ID: Colleen [name], female   DOB: 17-May-1935, 80 y.o.   MRN: [id number] PCP: Dr [name] HF Cardiologist: Dr Aundra Dubin  Pulmonary: Dr Ashok Cordia  HPI: [name] is a 80 yo female with PMHx of diastolic CHF (echo 06/22/68 EF 65-70%), HTN, hypothyroidism, and paroxysmal atrial fibrillation.   Admitted to [facility] 07/30/15 with complaint of DOE, weight gain, orthopnea and increased edema. Diuresed with IV lasix and transitioned to lasix 40 mg twice a day. Hospital course compliacted by hypoxic respiratory failure. CT of chest negative for PE but she had bilateral lung infiltrates concerning for inflammatory lung disease.  Therefore, placed on solumedrol then transtioned to prednisone Prinizide was stopped.  Discharge weight was 171 pounds.    At a prior visit, she was noted to be in rate-controlled atrial fibrillation and was started on coumadin. At last visit and today, she has been in NSR.    She saw pulmonary.  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  She was on prednisone but was tapered off.   Significant edema/volume overload at last appointment.  Amlodipine was stopped and Lasix was increased.  She is doing much better.  Weight is down 6 lbs.  Lower extremity swelling has resolved.  No dyspnea walking on flat ground.  Mild dyspnea walking up a flight of steps.  No orthopnea/PND.   She is no longer using oxygen. Oxygen saturation good today.   RHC 08/14/2015  RA mean 20 RV 72/20 PA 72/27, mean 39 PCWP mean 29, prominent V-waves Oxygen saturations: PA 65% AO 97% Cardiac Output (Fick) 5.75  Cardiac Index (Fick) 3.06  PVR 1.7 WU  Labs (08/21/2015): K 3.6 Creatinine 1.04 , hemoglobin 10 Labs (10/16): K 3.5, creatinine 0.89  ROS: All systems negative except as listed in HPI, PMH and Problem List.  SH:  Social History   Social History  . Marital Status: Married    Spouse Name: N/A  . Number of Children: 2  . Years of Education: N/A   Occupational History  . retired    Social History Main  Topics  . Smoking status: Former Smoker -- 1.00 packs/day for 47 years    Types: Cigarettes    Quit date: 12/24/1999  . Smokeless tobacco: Not on file  . Alcohol Use: 0.0 oz/week    0 Standard drinks or equivalent per week     Comment: 1 glass of wine rarely  . Drug Use: No  . Sexual Activity: Not on file   Other Topics Concern  . Not on file   Social History Narrative   Originally she is from [place]. She has previously lived in [place] & moved to [place] in 1985. 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 No mold or hot tub exposure. No bird exposure.     FH: No premature CAD  Past Medical History  Diagnosis Date  . Hyperlipidemia   . Hypertension   . Hypothyroidism   . TIA (transient ischemic attack) 2001  . Carotid artery disease ([place])   . Shortness of breath   . CHF (congestive heart failure) ([place])   . Atrial fibrillation [facility])     Current Outpatient Prescriptions  Medication Sig Dispense Refill  . atenolol (TENORMIN) 50 MG tablet Take 25 mg by mouth daily.    . Calcium Carbonate (GNP CALCIUM) 1500 MG TABS Take 1,500 mg by mouth every evening.     . Cholecalciferol 1000 UNITS tablet Take 1,000 Units by mouth every evening.     Marland Kitchen  furosemide (LASIX) 40 MG tablet Take 2 tablets (80 mg total) by mouth 2 (two) times daily. 120 tablet 5  . levothyroxine (SYNTHROID, LEVOTHROID) 100 MCG tablet Take 100 mcg by mouth daily before breakfast.     . lisinopril (ZESTRIL) 10 MG tablet Take 1 tablet (10 mg total) by mouth daily. 30 tablet 6  . magnesium 30 MG tablet Take 30 mg by mouth every evening.     . niacin 500 MG tablet Take 500 mg by mouth every evening. otc    . potassium chloride SA (K-DUR,KLOR-CON) 20 MEQ tablet Take 2 tabs (40 meQ) in the am and 1 tab in the PM (20 meq) 90 tablet 5  . vitamin B-12 (CYANOCOBALAMIN) 500 MCG tablet Take 500 mcg by mouth every evening.     . warfarin (COUMADIN) 5 MG tablet Take 1 tablet (5 mg total) by mouth daily. 30 tablet 3  .  NON FORMULARY Place 1.5 L into the nose continuous. 1.5 liters in office/ 2 liters at home     No current facility-administered medications for this encounter.    Filed Vitals:   11/06/15 1032  BP: 106/60  Pulse: 73  Weight: 171 lb 8 oz (77.792 kg)  SpO2: 97%    PHYSICAL EXAM:  General:  Well appearing. No resp difficulty  HEENT: normal Neck: supple. JVP 7. Carotids 2+ bilaterally; no bruits. No lymphadenopathy or thryomegaly appreciated. Cor: PMI normal. Regular rate & rhythm. No rubs, gallops or murmurs. Lungs: clear Abdomen: soft, nontender, nondistended. No hepatosplenomegaly. No bruits or masses. Good bowel sounds. Extremities: no cyanosis, clubbing, rash.  No edema.  Neuro: alert & orientedx3, cranial nerves grossly intact. Moves all 4 extremities w/o difficulty. Affect pleasant.  ASSESSMENT & PLAN:  1. Chronic  Respiratory Failure: Pulmonary work up => CT showed infiltrates of unknown etiology with concern for inflammatory lung disease. ECHO with bubble study negative for cardiac or pulmonary shunt.  PCCM suspects underlying inflammatory process that is autoimmune in etiology given her elevated ESR & rheumatoid factor. ANA negative.  - She is now off prednisone.   - She will get a followup high resolution CT chest and pulmonary followup in 1/17.  - Good oxygen saturation today, she is now off home oxygen.   2. Chronic Diastolic CHF: EF normal at 65-70%, RV mildly dilated. RHC during last admission with elevated filling pressures noted as well as pulmonary venous hypertension.  NYHA class II.  Volume status much improved after increasing Lasix and stopping amlodipine.  Weight is down 6 lbs.  - Continue Lasix 80 mg bid.  Continue KCl 40 qam/20 qpm.  BMET/BNP today.   3. HTN: Lower extremity edema with amlodipine.  BP controlled with lisinopril currently.  4. H/o accelerated junctional rhythm 5. Atrial fibrillation: Paroxysmal, CHADSVASC Score 4.  She is in NSR today by  exam.  Continue atenolol 12.5 mg daily.  On coumadin. Followed at [facility] at [facility] to manage INR.  I am concerned that she may have OSA, which could increase her risk of recurrent atrial fibrillation.  She is not interested in a sleep study as she says that she would not wear CPAP.  Followup in 2 months.   Loralie Champagne 11/06/2015

## 2015-11-06 NOTE — Progress Notes (Signed)
Advanced Heart Failure Medication Review by a Pharmacist  Does the patient  feel that his/her medications are working for him/her?  yes  Has the patient been experiencing any side effects to the medications prescribed?  no  Does the patient measure his/her own blood pressure or blood glucose at home?  yes   Does the patient have any problems obtaining medications due to transportation or finances?   no  Understanding of regimen: good Understanding of indications: good Potential of compliance: good Patient understands to avoid NSAIDs. Patient understands to avoid decongestants.  Issues to address at subsequent visits: None   Pharmacist comments:  [name] is a pleasant 80 yo F presenting without a medication list but with excellent recall of her medications including dosages. She reports good compliance with her medications and is happy to report that since her Norvasc was discontinued and lisinopril started, she no longer has lower extremity edema and has been breathing much better even without her oxygen. She did not have any specific medication-related questions or concerns for me at this time.   Colleen [name]. Colleen [name], PharmD, BCPS, CPP Clinical Pharmacist Pager: [phone] Phone: [phone] 11/06/2015 10:56 AM      Time with patient: 8 minutes Preparation and documentation time: 2 minutes Total time: 10 minutes

## 2015-11-07 ENCOUNTER — Telehealth: Payer: Self-pay | Admitting: *Deleted

## 2015-11-07 DIAGNOSIS — J9601 Acute respiratory failure with hypoxia: Secondary | ICD-10-CM

## 2015-11-07 NOTE — Telephone Encounter (Signed)
-----   Message from [name], MD sent at 11/06/2015  6:51 PM EST ----- Colleen [name]. Spoke with Mrs. Blankenship about pulse ox results. Needs repeat overnight pulse ox on room air.  Thanks.

## 2015-11-07 NOTE — Telephone Encounter (Signed)
I have replaced order

## 2015-11-08 ENCOUNTER — Encounter: Payer: Self-pay | Admitting: Cardiovascular Disease

## 2015-11-08 ENCOUNTER — Telehealth: Payer: Self-pay

## 2015-11-08 ENCOUNTER — Ambulatory Visit (INDEPENDENT_AMBULATORY_CARE_PROVIDER_SITE_OTHER): Payer: Medicare Other | Admitting: Cardiovascular Disease

## 2015-11-08 ENCOUNTER — Ambulatory Visit (INDEPENDENT_AMBULATORY_CARE_PROVIDER_SITE_OTHER): Payer: Medicare Other | Admitting: Pharmacist Clinician (PhC)/ Clinical Pharmacy Specialist

## 2015-11-08 VITALS — BP 108/68 | HR 67 | Ht 65.0 in | Wt 171.0 lb

## 2015-11-08 DIAGNOSIS — Z7901 Long term (current) use of anticoagulants: Secondary | ICD-10-CM

## 2015-11-08 DIAGNOSIS — I5032 Chronic diastolic (congestive) heart failure: Secondary | ICD-10-CM

## 2015-11-08 DIAGNOSIS — I480 Paroxysmal atrial fibrillation: Secondary | ICD-10-CM | POA: Diagnosis not present

## 2015-11-08 DIAGNOSIS — E785 Hyperlipidemia, unspecified: Secondary | ICD-10-CM | POA: Diagnosis not present

## 2015-11-08 DIAGNOSIS — I10 Essential (primary) hypertension: Secondary | ICD-10-CM | POA: Diagnosis not present

## 2015-11-08 DIAGNOSIS — R0602 Shortness of breath: Secondary | ICD-10-CM | POA: Diagnosis not present

## 2015-11-08 DIAGNOSIS — R001 Bradycardia, unspecified: Secondary | ICD-10-CM | POA: Diagnosis not present

## 2015-11-08 LAB — POCT INR: INR: 2.2

## 2015-11-08 NOTE — Progress Notes (Signed)
11/08/2015 Colleen [name]   May 27, 1935  [id number]  Primary Physician Garnet Koyanagi, DO Primary Cardiologist: Lorretta Harp MD [name]   HPI:   The patient is a 80 year old mildly to moderately overweight married Caucasian female, the mother of 2, grandmother to 5 grandchildren, whom I last saw  07/28/15.Marland Kitchen She has a history of PVOD, status post left carotid endarterectomy in the past preceded by a TIA approximately 10-12 years ago by Dr. Sherren Mocha Early. We have been following her Dopplers here in our office. Her most recent carotid Dopplers, performed November 01, 2011, revealed moderate right ICA stenosis with a widely patent left endarterectomy site. Other problems include hypertension and hyperlipidemia. She quit smoking approximately 20 years ago. She denies chest pain or shortness of breath. Echo and Myoview performed February 2011 were normal. Dr. Chalmers Cater follows her lipid profile. Since I saw her in the office back in October she did develop chest pain and was admitted to [facility] 02/20/15. She ruled out for myocardial infarction. A Myoview stress test was low risk and nonischemic with normal LV function. She has had no recurrent symptoms. She had retinal surgery performed as an outpatient 07/12/15 and was somewhat inactive after that time. She gradually developed increasing shortness of breath at rest and with exertion. She was seen in the emergency room 07/22/15 where chest x-ray showed subtle interstitial edema. BNP was elevated at 394. Troponins were negative. A 2-D echo performed in August showed normal LV systolic function. She underwent right heart cath by Dr. Aundra Dubin 08/14/15 revealing pulmonary hypertension with mean wedge pressure 29 and prominent V waves. She ultimately was diuresed. She also was diagnosed with an autoimmune lung disease and had a course of prednisone which led to peripheral edema in addition to her amlodipine. The amlodipine was discontinued. Her  Toprol was changed to atenolol and her diuretics adjusted. She no longer requires supplemental oxygen.  Current Outpatient Prescriptions  Medication Sig Dispense Refill  . atenolol (TENORMIN) 50 MG tablet Take 25 mg by mouth daily.    . Calcium Carbonate (GNP CALCIUM) 1500 MG TABS Take 1,500 mg by mouth every evening.     . Cholecalciferol 1000 UNITS tablet Take 1,000 Units by mouth every evening.     . furosemide (LASIX) 40 MG tablet Take 2 tablets (80 mg total) by mouth 2 (two) times daily. 120 tablet 5  . levothyroxine (SYNTHROID, LEVOTHROID) 100 MCG tablet Take 100 mcg by mouth daily before breakfast.     . lisinopril (ZESTRIL) 10 MG tablet Take 1 tablet (10 mg total) by mouth daily. 30 tablet 6  . magnesium 30 MG tablet Take 30 mg by mouth every evening.     . niacin 500 MG tablet Take 500 mg by mouth every evening. otc    . potassium chloride SA (K-DUR,KLOR-CON) 20 MEQ tablet Take 2 tabs (40 meQ) in the am and 1 tab in the PM (20 meq) 90 tablet 5  . vitamin B-12 (CYANOCOBALAMIN) 500 MCG tablet Take 500 mcg by mouth every evening.     . warfarin (COUMADIN) 5 MG tablet Take 1 tablet (5 mg total) by mouth daily. 30 tablet 3   No current facility-administered medications for this visit.    No Known Allergies  Social History   Social History  . Marital Status: Married    Spouse Name: N/A  . Number of Children: 2  . Years of Education: N/A   Occupational History  . retired  Social History Main Topics  . Smoking status: Former Smoker -- 1.00 packs/day for 47 years    Types: Cigarettes    Quit date: 12/24/1999  . Smokeless tobacco: Not on file  . Alcohol Use: 0.0 oz/week    0 Standard drinks or equivalent per week     Comment: 1 glass of wine rarely  . Drug Use: No  . Sexual Activity: Not on file   Other Topics Concern  . Not on file   Social History Narrative   Originally she is from [place]. She has previously lived in [place] & moved to [place] in 1985. 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 No mold or hot tub exposure. No bird exposure.      Review of Systems: General: negative for chills, fever, night sweats or weight changes.  Cardiovascular: negative for chest pain, dyspnea on exertion, edema, orthopnea, palpitations, paroxysmal nocturnal dyspnea or shortness of breath Dermatological: negative for rash Respiratory: negative for cough or wheezing Urologic: negative for hematuria Abdominal: negative for nausea, vomiting, diarrhea, bright red blood per rectum, melena, or hematemesis Neurologic: negative for visual changes, syncope, or dizziness All other systems reviewed and are otherwise negative except as noted above.    Blood pressure 108/68, pulse 67, height 5\' 5"  (1.651 m), weight 171 lb (77.565 kg).  General appearance: alert and no distress Neck: no adenopathy, no carotid bruit, no JVD, supple, symmetrical, trachea midline and thyroid not enlarged, symmetric, no tenderness/mass/nodules Lungs: clear to auscultation bilaterally Heart: regular rate and rhythm, S1, S2 normal, no murmur, click, rub or gallop Extremities: extremities normal, atraumatic, no cyanosis or edema  EKG not performed today  ASSESSMENT AND PLAN:   Sinus bradycardia Currently has heart rates in the 60s on atenolol 25 mg a day  Shortness of breath Markedly improved on furosemide currently off supplemental oxygen  Hyperlipidemia History of hyperlipidemia not on statin therapy. Recent lipid profile performed 02/21/15 revealed a total cholesterol 148, LDL 83 and HDL of 47  Essential hypertension History of hypertension with blood pressure measurements at 108/68. She is on atenolol and lisinopril. Continue current meds at current dosing  Chronic diastolic CHF (congestive heart failure) (HCC) History of diastolic heart failure with echo revealing normal LV systolic function and right heart cath performed by Dr. Aundra Dubin 08/14/15 revealing a mean  pulmonary capillary wedge pressure 29 with prominent V waves. She is on twice a day diuretics and appears to be euvolemic. She does have mild dyspnea on exertion but this is markedly improved compared to several months ago.  Paroxysmal atrial fibrillation (HCC) History of PAF maintaining sinus rhythm on Coumadin anticoagulation      Lorretta Harp MD [facility], [facility] 11/08/2015 11:25 AM

## 2015-11-08 NOTE — Assessment & Plan Note (Signed)
Markedly improved on furosemide currently off supplemental oxygen

## 2015-11-08 NOTE — Assessment & Plan Note (Signed)
History of diastolic heart failure with echo revealing normal LV systolic function and right heart cath performed by Dr. Aundra Dubin 08/14/15 revealing a mean pulmonary capillary wedge pressure 29 with prominent V waves. She is on twice a day diuretics and appears to be euvolemic. She does have mild dyspnea on exertion but this is markedly improved compared to several months ago.

## 2015-11-08 NOTE — Assessment & Plan Note (Signed)
History of hyperlipidemia not on statin therapy. Recent lipid profile performed 02/21/15 revealed a total cholesterol 148, LDL 83 and HDL of 47

## 2015-11-08 NOTE — Assessment & Plan Note (Signed)
Currently has heart rates in the 60s on atenolol 25 mg a day

## 2015-11-08 NOTE — Telephone Encounter (Signed)
Pt in today for OV had questions about a new PCP.  Dr. Gwenlyn Found recommended DR. Rachell Cipro [phone]  Left message on pt home machine with Dr. Ernie Hew information and to call back if have any questions.

## 2015-11-08 NOTE — Patient Instructions (Signed)

## 2015-11-08 NOTE — Assessment & Plan Note (Signed)
History of PAF maintaining sinus rhythm on Coumadin anticoagulation 

## 2015-11-08 NOTE — Assessment & Plan Note (Signed)
History of hypertension with blood pressure measurements at 108/68. She is on atenolol and lisinopril. Continue current meds at current dosing

## 2015-11-10 ENCOUNTER — Telehealth: Payer: Self-pay | Admitting: *Deleted

## 2015-11-10 NOTE — Telephone Encounter (Signed)
I called patient for 22-month follow -up phone call. Patient is doing well and has not been hospitalized in last 3 months.

## 2015-11-24 ENCOUNTER — Encounter: Payer: Self-pay | Admitting: Pulmonary Disease

## 2015-11-24 ENCOUNTER — Ambulatory Visit (INDEPENDENT_AMBULATORY_CARE_PROVIDER_SITE_OTHER): Payer: Medicare Other | Admitting: Pharmacist Clinician (PhC)/ Clinical Pharmacy Specialist

## 2015-11-24 DIAGNOSIS — I480 Paroxysmal atrial fibrillation: Secondary | ICD-10-CM | POA: Diagnosis not present

## 2015-11-24 DIAGNOSIS — Z7901 Long term (current) use of anticoagulants: Secondary | ICD-10-CM

## 2015-11-24 LAB — POCT INR: INR: 2.7

## 2015-12-01 ENCOUNTER — Telehealth: Payer: Self-pay | Admitting: Pulmonary Disease

## 2015-12-01 NOTE — Telephone Encounter (Signed)
OVERNIGHT PULSE OX 11/20/15:  6:29:56 analyzed time. Lowest saturation 81% on room air. 1:52:36 with saturation </= 88% on room air.  Relayed results of overnight pulse ox to patient. Concerned that she may have underlying OSA but she does not want to wear a CPAP. She is concerned about the cost of her oxygen ($90/month) after Medicare. She would really like to remove the machine/oxygen from her house. Explained that it is all her choice. She will restart using oxygen at 2 L/m while sleeping. Scheduled for a 6MWT on 01/03/16 before her appointment with me where we will discuss it further.

## 2015-12-22 ENCOUNTER — Ambulatory Visit (INDEPENDENT_AMBULATORY_CARE_PROVIDER_SITE_OTHER): Payer: Medicare Other | Admitting: Pharmacist Clinician (PhC)/ Clinical Pharmacy Specialist

## 2015-12-22 DIAGNOSIS — Z7901 Long term (current) use of anticoagulants: Secondary | ICD-10-CM | POA: Diagnosis not present

## 2015-12-22 DIAGNOSIS — I480 Paroxysmal atrial fibrillation: Secondary | ICD-10-CM | POA: Diagnosis not present

## 2015-12-22 LAB — POCT INR: INR: 3.1

## 2016-01-03 ENCOUNTER — Ambulatory Visit: Payer: Medicare Other | Admitting: Pulmonary Disease

## 2016-01-03 ENCOUNTER — Ambulatory Visit: Payer: Medicare Other

## 2016-01-04 ENCOUNTER — Encounter: Payer: Self-pay | Admitting: Pulmonary Disease

## 2016-01-19 ENCOUNTER — Ambulatory Visit (INDEPENDENT_AMBULATORY_CARE_PROVIDER_SITE_OTHER): Payer: Medicare Other | Admitting: Pharmacist Clinician (PhC)/ Clinical Pharmacy Specialist

## 2016-01-19 DIAGNOSIS — Z7901 Long term (current) use of anticoagulants: Secondary | ICD-10-CM | POA: Diagnosis not present

## 2016-01-19 DIAGNOSIS — I480 Paroxysmal atrial fibrillation: Secondary | ICD-10-CM | POA: Diagnosis not present

## 2016-01-19 LAB — POCT INR: INR: 2.8

## 2016-01-29 ENCOUNTER — Ambulatory Visit (HOSPITAL_COMMUNITY)
Admission: RE | Admit: 2016-01-29 | Discharge: 2016-01-29 | Disposition: A | Discharge status: Home / Self Care | Payer: Medicare Other | Source: Ambulatory Visit | Attending: Cardiology | Admitting: Cardiology

## 2016-01-29 VITALS — BP 109/62 | HR 58 | Resp 18 | Wt 172.0 lb

## 2016-01-29 DIAGNOSIS — E785 Hyperlipidemia, unspecified: Secondary | ICD-10-CM | POA: Diagnosis not present

## 2016-01-29 DIAGNOSIS — I110 Hypertensive heart disease with heart failure: Secondary | ICD-10-CM | POA: Diagnosis not present

## 2016-01-29 DIAGNOSIS — Z7901 Long term (current) use of anticoagulants: Secondary | ICD-10-CM | POA: Insufficient documentation

## 2016-01-29 DIAGNOSIS — Z87891 Personal history of nicotine dependence: Secondary | ICD-10-CM | POA: Insufficient documentation

## 2016-01-29 DIAGNOSIS — Z79899 Other long term (current) drug therapy: Secondary | ICD-10-CM | POA: Insufficient documentation

## 2016-01-29 DIAGNOSIS — J9610 Chronic respiratory failure, unspecified whether with hypoxia or hypercapnia: Secondary | ICD-10-CM | POA: Diagnosis not present

## 2016-01-29 DIAGNOSIS — I480 Paroxysmal atrial fibrillation: Secondary | ICD-10-CM

## 2016-01-29 DIAGNOSIS — I5032 Chronic diastolic (congestive) heart failure: Secondary | ICD-10-CM

## 2016-01-29 DIAGNOSIS — E039 Hypothyroidism, unspecified: Secondary | ICD-10-CM | POA: Insufficient documentation

## 2016-01-29 DIAGNOSIS — Z8673 Personal history of transient ischemic attack (TIA), and cerebral infarction without residual deficits: Secondary | ICD-10-CM | POA: Diagnosis not present

## 2016-01-29 DIAGNOSIS — R918 Other nonspecific abnormal finding of lung field: Secondary | ICD-10-CM

## 2016-01-29 LAB — BASIC METABOLIC PANEL
ANION GAP: 13 (ref 5–15)
BUN: 13 mg/dL (ref 6–20)
CALCIUM: 9.8 mg/dL (ref 8.9–10.3)
CHLORIDE: 104 mmol/L (ref 101–111)
CO2: 24 mmol/L (ref 22–32)
Creatinine, Ser: 1 mg/dL (ref 0.44–1.00)
GFR calc non Af Amer: 53 mL/min — ABNORMAL LOW (ref >60)
Glucose, Bld: 105 mg/dL — ABNORMAL HIGH (ref 65–99)
Potassium: 4.6 mmol/L (ref 3.5–5.1)
Sodium: 141 mmol/L (ref 135–145)

## 2016-01-29 LAB — BRAIN NATRIURETIC PEPTIDE: B Natriuretic Peptide: 108.1 pg/mL — ABNORMAL HIGH (ref 0.0–100.0)

## 2016-01-29 NOTE — Progress Notes (Signed)
Patient ID: Colleen [name], female   DOB: May 10, 1935, 81 y.o.   MRN: [id number] PCP: Dr [name] HF Cardiologist: Dr Aundra Dubin  Pulmonary: Dr Ashok Cordia  HPI: [name] is a 81 yo female with PMHx of diastolic CHF (echo 03/29/81 EF 65-70%), HTN, hypothyroidism, and paroxysmal atrial fibrillation.   Admitted to [facility] 07/30/15 with complaint of DOE, weight gain, orthopnea and increased edema. Diuresed with IV lasix and transitioned to lasix 40 mg twice a day. Hospital course compliacted by hypoxic respiratory failure. CT of chest negative for PE but she had bilateral lung infiltrates concerning for inflammatory lung disease.  Therefore, placed on solumedrol then transtioned to prednisone Prinizide was stopped.  Discharge weight was 171 pounds.    At a prior visit, she was noted to be in rate-controlled atrial fibrillation and was started on coumadin. At last visit and today, she has been in NSR.    She saw pulmonary.  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  She was on prednisone but was tapered off.   She continues to do well from a CHF standpoint. She is now taking Lasix 40 mg bid rather than 80 mg bid but weight is stable.  She is off oxygen.  No significant exertional dyspnea normally.  Able to walk up a flight of steps without problems.  Mild dyspnea only if she walks fast. She does have periodic episodes of tachypalpitations. These happen up to twice a day now and can last for up to an hour.  No lightheadedness or chest pain with episodes but she finds them uncomfortable.   RHC 08/14/2015  RA mean 20 RV 72/20 PA 72/27, mean 39 PCWP mean 29, prominent V-waves Oxygen saturations: PA 65% AO 97% Cardiac Output (Fick) 5.75  Cardiac Index (Fick) 3.06  PVR 1.7 WU  Labs (08/21/2015): K 3.6 Creatinine 1.04 , hemoglobin 10 Labs (10/16): K 3.5, creatinine 0.89 Labs (11/16): K 4.4, creatinine 0.96, BNP 72  ECG: NSR, poor RWP, old inferior MI  ROS: All systems negative except as listed in HPI, PMH and Problem  List.  SH:  Social History   Social History  . Marital Status: Married    Spouse Name: N/A  . Number of Children: 2  . Years of Education: N/A   Occupational History  . retired    Social History Main Topics  . Smoking status: Former Smoker -- 1.00 packs/day for 47 years    Types: Cigarettes    Quit date: 12/24/1999  . Smokeless tobacco: Not on file  . Alcohol Use: 0.0 oz/week    0 Standard drinks or equivalent per week     Comment: 1 glass of wine rarely  . Drug Use: No  . Sexual Activity: Not on file   Other Topics Concern  . Not on file   Social History Narrative   Originally she is from [place]. She has previously lived in [place] & moved to [place] in 1985. 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 No mold or hot tub exposure. No bird exposure.     FH: No premature CAD  Past Medical History  Diagnosis Date  . Hyperlipidemia   . Hypertension   . Hypothyroidism   . TIA (transient ischemic attack) 2001  . Carotid artery disease ([place])   . Shortness of breath   . CHF (congestive heart failure) ([place])   . Atrial fibrillation [facility])     Current Outpatient Prescriptions  Medication Sig Dispense Refill  . atenolol (TENORMIN) 50 MG tablet  Take 25 mg by mouth daily.    . Calcium Carbonate (GNP CALCIUM) 1500 MG TABS Take 1,500 mg by mouth every evening.     . Cholecalciferol 1000 UNITS tablet Take 1,000 Units by mouth every evening.     . furosemide (LASIX) 40 MG tablet Take 40 mg by mouth 2 (two) times daily. Will take extra 43m PRN for swelling    . levothyroxine (SYNTHROID, LEVOTHROID) 100 MCG tablet Take 100 mcg by mouth daily before breakfast.     . lisinopril (ZESTRIL) 10 MG tablet Take 1 tablet (10 mg total) by mouth daily. 30 tablet 6  . magnesium 30 MG tablet Take 30 mg by mouth every evening.     . niacin 500 MG tablet Take 500 mg by mouth every evening. otc    . potassium chloride SA (K-DUR,KLOR-CON) 20 MEQ tablet Take 20 mEq by mouth 2 (two)  times daily.    . vitamin B-12 (CYANOCOBALAMIN) 500 MCG tablet Take 500 mcg by mouth every evening.     . warfarin (COUMADIN) 5 MG tablet Take 1 tablet (5 mg total) by mouth daily. 30 tablet 3   No current facility-administered medications for this encounter.    Filed Vitals:   01/29/16 0931  BP: 109/62  Pulse: 58  Resp: 18  Weight: 172 lb (78.019 kg)  SpO2: 94%    PHYSICAL EXAM:  General:  Well appearing. No resp difficulty  HEENT: normal Neck: supple. JVP 7. Carotids 2+ bilaterally; no bruits. No lymphadenopathy or thryomegaly appreciated. Cor: PMI normal. Regular rate & rhythm. No rubs, gallops or murmurs. Lungs: clear Abdomen: soft, nontender, nondistended. No hepatosplenomegaly. No bruits or masses. Good bowel sounds. Extremities: no cyanosis, clubbing, rash.  No edema.  Neuro: alert & orientedx3, cranial nerves grossly intact. Moves all 4 extremities w/o difficulty. Affect pleasant.  ASSESSMENT & PLAN:  1. Chronic  Respiratory Failure: Pulmonary work up => CT showed infiltrates of unknown etiology with concern for inflammatory lung disease. ECHO with bubble study negative for cardiac or pulmonary shunt.  PCCM suspects underlying inflammatory process that is autoimmune in etiology given her elevated ESR & rheumatoid factor. ANA negative.  - She is now off prednisone.   - She did not go to pulmonary followup in January, was supposed to get repeat high resolution CT. I told her that it would be a good idea to arrange pulmonary followup. - Good oxygen saturation today, she is now off home oxygen.   2. Chronic Diastolic CHF: EF normal at 65-70%, RV mildly dilated. RHC during last admission with elevated filling pressures noted as well as pulmonary venous hypertension.  NYHA class II.  Volume status much improved compared to prior.  - She can continue Lasix 40 mg bid.  BMET/BNP today.  3. HTN: Lower extremity edema with amlodipine.  BP controlled with lisinopril currently.  4.  H/o accelerated junctional rhythm 5. Atrial fibrillation: Paroxysmal, CHADSVASC Score 4.  She is in NSR today.   - Continue atenolol 12.5 mg daily.   - On [facility]in. [facility] - I am concerned that she may have OSA, which could increase her risk of recurrent atrial fibrillation.  She is not interested in a sleep study as she says that she would not wear CPAP. - Frequent episodes of tachypalpitations recently concerning for paroxysmal atrial fibrillation.  I will arrange for 2 week monitor to assess for PAF.  If she is indeed having symptomatic PAF, she would be  a candidate for flecainide (no history of CAD).  If flecainide were used, she would need a stress test done.   Loralie Champagne 01/29/2016

## 2016-01-29 NOTE — Patient Instructions (Signed)
Routine lab work today. (bmet bnp) Will notify you of abnormal results  Your provider requests you have a 2 week event monitor.   Follow up in 3 months with Dr.McLean

## 2016-01-31 ENCOUNTER — Other Ambulatory Visit (HOSPITAL_COMMUNITY): Payer: Self-pay | Admitting: Cardiology

## 2016-01-31 DIAGNOSIS — I480 Paroxysmal atrial fibrillation: Secondary | ICD-10-CM

## 2016-01-31 DIAGNOSIS — I5032 Chronic diastolic (congestive) heart failure: Secondary | ICD-10-CM

## 2016-02-01 ENCOUNTER — Ambulatory Visit (INDEPENDENT_AMBULATORY_CARE_PROVIDER_SITE_OTHER): Payer: Medicare Other

## 2016-02-01 DIAGNOSIS — I5032 Chronic diastolic (congestive) heart failure: Secondary | ICD-10-CM

## 2016-02-01 DIAGNOSIS — I480 Paroxysmal atrial fibrillation: Secondary | ICD-10-CM | POA: Diagnosis not present

## 2016-02-02 ENCOUNTER — Other Ambulatory Visit (HOSPITAL_COMMUNITY): Payer: Self-pay | Admitting: Adult Health

## 2016-02-19 ENCOUNTER — Ambulatory Visit (INDEPENDENT_AMBULATORY_CARE_PROVIDER_SITE_OTHER): Payer: Medicare Other | Admitting: Pharmacist Clinician (PhC)/ Clinical Pharmacy Specialist

## 2016-02-19 DIAGNOSIS — Z7901 Long term (current) use of anticoagulants: Secondary | ICD-10-CM

## 2016-02-19 DIAGNOSIS — I480 Paroxysmal atrial fibrillation: Secondary | ICD-10-CM | POA: Diagnosis not present

## 2016-02-19 LAB — POCT INR: INR: 1.7

## 2016-02-22 ENCOUNTER — Telehealth (HOSPITAL_COMMUNITY): Payer: Self-pay | Admitting: *Deleted

## 2016-02-22 DIAGNOSIS — I480 Paroxysmal atrial fibrillation: Secondary | ICD-10-CM

## 2016-02-22 MED ORDER — FLECAINIDE ACETATE 50 MG PO TABS: 50.0000 mg | ORAL_TABLET | Freq: Two times a day (BID) | ORAL | Status: DC

## 2016-02-22 NOTE — Telephone Encounter (Signed)
Notes Recorded by Scarlette Calico, RN on 02/22/2016 at 4:22 PM Pt aware and agreeable to go ahead and start med, she states Dr Aundra Dubin discussed the possibility of Flecainide with her at her last OV, rx sent in, order placed for [facility]

## 2016-02-22 NOTE — Telephone Encounter (Signed)
-----   Message from Larey Dresser, MD sent at 02/22/2016 12:19 AM EST ----- Runs of atrial fibrillation with RVR are likely causing her palpitations.  She could start on flecainide 50 mg bid to control this.  Would need Lexiscan Cardiolite to rule out ischemia if she starts on flecainide.  Can see in office to discuss.

## 2016-03-11 ENCOUNTER — Ambulatory Visit (INDEPENDENT_AMBULATORY_CARE_PROVIDER_SITE_OTHER): Payer: Medicare Other | Admitting: Pharmacist Clinician (PhC)/ Clinical Pharmacy Specialist

## 2016-03-11 DIAGNOSIS — I480 Paroxysmal atrial fibrillation: Secondary | ICD-10-CM

## 2016-03-11 DIAGNOSIS — Z7901 Long term (current) use of anticoagulants: Secondary | ICD-10-CM | POA: Diagnosis not present

## 2016-03-11 LAB — POCT INR: INR: 2.7

## 2016-03-12 ENCOUNTER — Telehealth (HOSPITAL_COMMUNITY): Payer: Self-pay

## 2016-03-12 NOTE — Telephone Encounter (Signed)
Encounter complete. 

## 2016-03-14 ENCOUNTER — Ambulatory Visit (HOSPITAL_COMMUNITY)
Admission: RE | Admit: 2016-03-14 | Discharge: 2016-03-14 | Disposition: A | Discharge status: Home / Self Care | Payer: Medicare Other | Source: Ambulatory Visit | Attending: Cardiology | Admitting: Cardiology

## 2016-03-14 DIAGNOSIS — I779 Disorder of arteries and arterioles, unspecified: Secondary | ICD-10-CM | POA: Diagnosis not present

## 2016-03-14 DIAGNOSIS — I480 Paroxysmal atrial fibrillation: Secondary | ICD-10-CM | POA: Insufficient documentation

## 2016-03-14 DIAGNOSIS — I10 Essential (primary) hypertension: Secondary | ICD-10-CM | POA: Diagnosis not present

## 2016-03-14 DIAGNOSIS — R002 Palpitations: Secondary | ICD-10-CM | POA: Diagnosis not present

## 2016-03-14 DIAGNOSIS — R0609 Other forms of dyspnea: Secondary | ICD-10-CM | POA: Diagnosis not present

## 2016-03-14 DIAGNOSIS — Z8673 Personal history of transient ischemic attack (TIA), and cerebral infarction without residual deficits: Secondary | ICD-10-CM | POA: Diagnosis not present

## 2016-03-14 DIAGNOSIS — Z87891 Personal history of nicotine dependence: Secondary | ICD-10-CM | POA: Insufficient documentation

## 2016-03-14 LAB — MYOCARDIAL PERFUSION IMAGING
CHL CUP NUCLEAR SDS: 3
CHL CUP NUCLEAR SRS: 0
CHL CUP NUCLEAR SSS: 3
CHL CUP RESTING HR STRESS: 54 {beats}/min
LV sys vol: 18 mL
LVDIAVOL: 79 mL (ref 46–106)
Peak HR: 80 {beats}/min
TID: 0.98

## 2016-03-14 MED ORDER — TECHNETIUM TC 99M SESTAMIBI GENERIC - CARDIOLITE
10.0000 | Freq: Once | INTRAVENOUS | Status: AC | PRN
  Administered 2016-03-14: 10 via INTRAVENOUS

## 2016-03-14 MED ORDER — REGADENOSON 0.4 MG/5ML IV SOLN
0.4000 mg | Freq: Once | INTRAVENOUS | Status: AC
  Administered 2016-03-14: 0.4 mg via INTRAVENOUS

## 2016-03-14 MED ORDER — TECHNETIUM TC 99M SESTAMIBI GENERIC - CARDIOLITE
32.5000 | Freq: Once | INTRAVENOUS | Status: AC | PRN
  Administered 2016-03-14: 32.5 via INTRAVENOUS

## 2016-03-17 ENCOUNTER — Other Ambulatory Visit (HOSPITAL_COMMUNITY): Payer: Self-pay | Admitting: Adult Health

## 2016-03-19 ENCOUNTER — Telehealth (HOSPITAL_COMMUNITY): Payer: Self-pay

## 2016-03-19 NOTE — Telephone Encounter (Signed)
Stress test results reviewed with patient. No questions or concerns at this time.  Renee Pain

## 2016-04-08 ENCOUNTER — Ambulatory Visit (INDEPENDENT_AMBULATORY_CARE_PROVIDER_SITE_OTHER): Payer: Medicare Other | Admitting: Pharmacist Clinician (PhC)/ Clinical Pharmacy Specialist

## 2016-04-08 DIAGNOSIS — Z7901 Long term (current) use of anticoagulants: Secondary | ICD-10-CM

## 2016-04-08 DIAGNOSIS — I480 Paroxysmal atrial fibrillation: Secondary | ICD-10-CM | POA: Diagnosis not present

## 2016-04-08 LAB — POCT INR: INR: 2.5

## 2016-04-19 ENCOUNTER — Encounter (HOSPITAL_COMMUNITY): Payer: Self-pay

## 2016-04-19 ENCOUNTER — Ambulatory Visit (HOSPITAL_COMMUNITY)
Admission: RE | Admit: 2016-04-19 | Discharge: 2016-04-19 | Disposition: A | Discharge status: Home / Self Care | Payer: Medicare Other | Source: Ambulatory Visit | Attending: Cardiology | Admitting: Cardiology

## 2016-04-19 ENCOUNTER — Telehealth (HOSPITAL_COMMUNITY): Payer: Self-pay | Admitting: Pharmacist

## 2016-04-19 VITALS — BP 140/58 | HR 54 | Resp 18 | Ht 65.0 in | Wt 173.0 lb

## 2016-04-19 DIAGNOSIS — J984 Other disorders of lung: Secondary | ICD-10-CM

## 2016-04-19 DIAGNOSIS — E039 Hypothyroidism, unspecified: Secondary | ICD-10-CM | POA: Insufficient documentation

## 2016-04-19 DIAGNOSIS — I272 Other secondary pulmonary hypertension: Secondary | ICD-10-CM | POA: Diagnosis not present

## 2016-04-19 DIAGNOSIS — Z8673 Personal history of transient ischemic attack (TIA), and cerebral infarction without residual deficits: Secondary | ICD-10-CM | POA: Insufficient documentation

## 2016-04-19 DIAGNOSIS — Z87891 Personal history of nicotine dependence: Secondary | ICD-10-CM | POA: Insufficient documentation

## 2016-04-19 DIAGNOSIS — Z7901 Long term (current) use of anticoagulants: Secondary | ICD-10-CM | POA: Insufficient documentation

## 2016-04-19 DIAGNOSIS — E785 Hyperlipidemia, unspecified: Secondary | ICD-10-CM | POA: Insufficient documentation

## 2016-04-19 DIAGNOSIS — I480 Paroxysmal atrial fibrillation: Secondary | ICD-10-CM | POA: Insufficient documentation

## 2016-04-19 DIAGNOSIS — Z79899 Other long term (current) drug therapy: Secondary | ICD-10-CM | POA: Insufficient documentation

## 2016-04-19 DIAGNOSIS — I110 Hypertensive heart disease with heart failure: Secondary | ICD-10-CM | POA: Diagnosis not present

## 2016-04-19 DIAGNOSIS — I5032 Chronic diastolic (congestive) heart failure: Secondary | ICD-10-CM | POA: Insufficient documentation

## 2016-04-19 DIAGNOSIS — J9610 Chronic respiratory failure, unspecified whether with hypoxia or hypercapnia: Secondary | ICD-10-CM | POA: Diagnosis not present

## 2016-04-19 LAB — BASIC METABOLIC PANEL
ANION GAP: 10 (ref 5–15)
BUN: 13 mg/dL (ref 6–20)
CALCIUM: 9.2 mg/dL (ref 8.9–10.3)
CO2: 28 mmol/L (ref 22–32)
CREATININE: 0.92 mg/dL (ref 0.44–1.00)
Chloride: 99 mmol/L — ABNORMAL LOW (ref 101–111)
GFR, EST NON AFRICAN AMERICAN: 58 mL/min — AB (ref >60)
Glucose, Bld: 96 mg/dL (ref 65–99)
Potassium: 3.9 mmol/L (ref 3.5–5.1)
SODIUM: 137 mmol/L (ref 135–145)

## 2016-04-19 LAB — CBC
HCT: 42.6 % (ref 36.0–46.0)
Hemoglobin: 13.8 g/dL (ref 12.0–15.0)
MCH: 31.4 pg (ref 26.0–34.0)
MCHC: 32.4 g/dL (ref 30.0–36.0)
MCV: 97 fL (ref 78.0–100.0)
PLATELETS: 221 10*3/uL (ref 150–400)
RBC: 4.39 MIL/uL (ref 3.87–5.11)
RDW: 13.3 % (ref 11.5–15.5)
WBC: 6.1 10*3/uL (ref 4.0–10.5)

## 2016-04-19 MED ORDER — POTASSIUM CHLORIDE CRYS ER 20 MEQ PO TBCR: 20.0000 meq | EXTENDED_RELEASE_TABLET | Freq: Every day | ORAL | Status: DC

## 2016-04-19 MED ORDER — FUROSEMIDE 40 MG PO TABS: 40.0000 mg | ORAL_TABLET | Freq: Every day | ORAL | Status: DC

## 2016-04-19 NOTE — Telephone Encounter (Signed)
Colleen [name] expressed concerns about the cost of her flecainide ($30/mo) and KCl ($13/mo). Unfortunately, the [employer] has fully allocated their funding for AF medications including flecainide and the HF fund does not cover KCl. It does cover atenolol, lisinopril and furosemide though so I have enrolled her to at least assist with these medications. She will have $1500 to use toward her copays through 04/18/17.   Billing ID: [id number]   Person Code: [facility]: [id number]   RX BIN: [id number]   PCN for Part D: [id number]    Ruta Hinds. Velva Harman, PharmD, BCPS, CPP Clinical Pharmacist Pager: [phone] Phone: [phone] 04/19/2016 10:01 AM

## 2016-04-19 NOTE — Progress Notes (Addendum)
Patient ID: Colleen [name], female   DOB: [birth year], 81 y.o.   MRN: [id number] PCP: Dr [name] HF Cardiologist: Dr Aundra Dubin  Pulmonary: Dr Ashok Cordia  HPI: Colleen [name] is a 81 yo female with PMHx of diastolic CHF (echo 01/30/30 EF 65-70%), HTN, hypothyroidism, and paroxysmal atrial fibrillation.   Admitted to [facility] 07/30/15 with complaint of DOE, weight gain, orthopnea and increased edema. Diuresed with IV lasix and transitioned to lasix 40 mg twice a day. Hospital course compliacted by hypoxic respiratory failure. CT of chest negative for PE but she had bilateral lung infiltrates concerning for inflammatory lung disease.  Therefore, placed on solumedrol then transtioned to prednisone Prinizide was stopped.  Discharge weight was 171 pounds.    At a prior visit, she was noted to be in rate-controlled atrial fibrillation and was started on coumadin. At last visit and today, she has been in NSR.    She saw pulmonary.  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  She was on prednisone but was tapered off.   She wore a 2 week monitor in 2/17 that showed frequent short runs of atrial fibrillation.  She was symptomatic with palpitations.  I started her on flecainide 50 mg bid and had her do a Cardiolite in 3/17, which showed no ischemia.   She feels very good today.  No more palpitations.  She is only taking Lasix and KCl once a day.  She will take a 2nd dose when she has had a salty meal and develops lower extremity edema.  Her weight is stable.  No exertional dyspnea.  No chest pain.  Not on oxygen with saturations in the upper 90s when she checks.    RHC 08/14/2015  RA mean 20 RV 72/20 PA 72/27, mean 39 PCWP mean 29, prominent V-waves Oxygen saturations: PA 65% AO 97% Cardiac Output (Fick) 5.75  Cardiac Index (Fick) 3.06  PVR 1.7 WU  2 week event monitor (2/17): Frequent short runs afib/RVR  Cardiolite (3/17): EF 77%, no ischemia/infarction.   Labs (08/21/2015): K 3.6 Creatinine 1.04 , hemoglobin 10 Labs  (10/16): K 3.5, creatinine 0.89 Labs (11/16): K 4.4, creatinine 0.96, BNP 72 Labs (2/17): K 4.6, creatinine 1.0  ROS: All systems negative except as listed in HPI, PMH and Problem List.  SH:  Social History   Social History  . Marital Status: Married    Spouse Name: N/A  . Number of Children: 2  . Years of Education: N/A   Occupational History  . retired    Social History Main Topics  . Smoking status: Former Smoker -- 1.00 packs/day for 47 years    Types: Cigarettes    Quit date: 12/24/1999  . Smokeless tobacco: Not on file  . Alcohol Use: 0.0 oz/week    0 Standard drinks or equivalent per week     Comment: 1 glass of wine rarely  . Drug Use: No  . Sexual Activity: Not on file   Other Topics Concern  . Not on file   Social History Narrative   Originally she is from [place]. She has previously lived in [place] & moved to [place] in 1985. 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 No mold or hot tub exposure. No bird exposure.     FH: No premature CAD  Past Medical History  Diagnosis Date  . Hyperlipidemia   . Hypertension   . Hypothyroidism   . TIA (transient ischemic attack) 2001  . Carotid artery disease ([place])   . Shortness  of breath   . CHF (congestive heart failure) ([place])   . Atrial fibrillation [facility])     Current Outpatient Prescriptions  Medication Sig Dispense Refill  . atenolol (TENORMIN) 50 MG tablet Take 25 mg by mouth daily.    . Calcium Carbonate (GNP CALCIUM) 1500 MG TABS Take 1,500 mg by mouth every evening.     . Cholecalciferol 1000 UNITS tablet Take 1,000 Units by mouth every evening.     . flecainide (TAMBOCOR) 50 MG tablet Take 1 tablet (50 mg total) by mouth 2 (two) times daily. 60 tablet 3  . furosemide (LASIX) 40 MG tablet Take 40 mg by mouth 2 (two) times daily. Will take extra 68m PRN for swelling    . levothyroxine (SYNTHROID, LEVOTHROID) 100 MCG tablet Take 100 mcg by mouth daily before breakfast.     . lisinopril  (ZESTRIL) 10 MG tablet Take 1 tablet (10 mg total) by mouth daily. 30 tablet 6  . magnesium 30 MG tablet Take 30 mg by mouth every evening.     . niacin 500 MG tablet Take 500 mg by mouth every evening. otc    . potassium chloride SA (K-DUR,KLOR-CON) 20 MEQ tablet Take 20 mEq by mouth 2 (two) times daily.    . vitamin B-12 (CYANOCOBALAMIN) 500 MCG tablet Take 500 mcg by mouth every evening.     . warfarin (COUMADIN) 5 MG tablet TAKE ONE TABLET BY MOUTH ONCE DAILY 30 tablet 0   No current facility-administered medications for this encounter.    Filed Vitals:   04/19/16 0923  BP: 140/58  Pulse: 54  Resp: 18  Height: _0  (1.651 m)  Weight: 173 lb (78.472 kg)  SpO2: 95%    PHYSICAL EXAM:  General:  Well appearing. No resp difficulty  HEENT: normal Neck: supple. JVP 7. Carotids 2+ bilaterally; no bruits. No lymphadenopathy or thryomegaly appreciated. Cor: PMI normal. Regular rate & rhythm. No rubs, gallops or murmurs. Lungs: clear Abdomen: soft, nontender, nondistended. No hepatosplenomegaly. No bruits or masses. Good bowel sounds. Extremities: no cyanosis, clubbing, rash.  No edema.  Neuro: alert & orientedx3, cranial nerves grossly intact. Moves all 4 extremities w/o difficulty. Affect pleasant.  ASSESSMENT & PLAN:  1. Chronic  Respiratory Failure: Pulmonary work up => CT showed infiltrates of unknown etiology with concern for inflammatory lung disease. ECHO with bubble study negative for cardiac or pulmonary shunt.  PCCM suspects underlying inflammatory process that is autoimmune in etiology given her elevated ESR & rheumatoid factor. ANA negative.  - She is now off prednisone and oxygen.   - Pulmonary followup and repeat high resolution CT recommended, but she does not want to go back.  She is doing well currently, told her that if she worsens, she really will need to see pulmonary again.  2. Chronic Diastolic CHF: EF on last echo normal at 65-70%, RV mildly dilated. RHC  during last admission with elevated filling pressures noted as well as pulmonary venous hypertension.  NYHA class II.  Volume status much improved compared to prior.  - She can continue Lasix 40 mg daily with KCl 20.   3. HTN: Lower extremity edema with amlodipine.  BP controlled with lisinopril currently.  4. H/o accelerated junctional rhythm 5. Atrial fibrillation: Paroxysmal, CHADSVASC Score 4.  She is in NSR today, now on flecainide.  Palpitations have resolved.    - Continue atenolol 25 mg daily.   - [facility].  Cannot  afford NO[facility].  - I am concerned that she may have OSA, which could increase her risk of recurrent atrial fibrillation.  She is not interested in a sleep study as she says that she would not wear CPAP. - Continue flecainide 50 mg bid. She is only taking flecainide once a day, I asked her to increase to twice a day.  Will get ECG today.    Loralie Champagne 04/19/2016

## 2016-04-19 NOTE — Addendum Note (Signed)
Encounter addended by: Scarlette Calico, RN on: 04/19/2016 10:00 AM<BR>     Documentation filed: Patient Instructions Section, Medications, Orders

## 2016-04-19 NOTE — Patient Instructions (Addendum)
Stop Atenolol  Take your Flecainide Twice daily   Stop Potassium   Continue taking your Furosemide (Lasix) DAILY  We will contact you in 6 months to schedule your next appointment.

## 2016-04-29 ENCOUNTER — Other Ambulatory Visit (HOSPITAL_COMMUNITY): Payer: Self-pay | Admitting: Adult Health

## 2016-04-30 ENCOUNTER — Encounter (HOSPITAL_COMMUNITY): Payer: Medicare Other

## 2016-05-06 ENCOUNTER — Ambulatory Visit (INDEPENDENT_AMBULATORY_CARE_PROVIDER_SITE_OTHER): Payer: Medicare Other | Admitting: Pharmacist

## 2016-05-06 DIAGNOSIS — Z7901 Long term (current) use of anticoagulants: Secondary | ICD-10-CM | POA: Diagnosis not present

## 2016-05-06 DIAGNOSIS — I480 Paroxysmal atrial fibrillation: Secondary | ICD-10-CM | POA: Diagnosis not present

## 2016-05-06 LAB — POCT INR: INR: 2.3

## 2016-05-23 ENCOUNTER — Telehealth (HOSPITAL_COMMUNITY): Payer: Self-pay | Admitting: *Deleted

## 2016-05-23 MED ORDER — FLECAINIDE ACETATE 100 MG PO TABS: 50.0000 mg | ORAL_TABLET | Freq: Two times a day (BID) | ORAL | Status: DC

## 2016-05-23 MED ORDER — FLECAINIDE ACETATE 100 MG PO TABS: 100.0000 mg | ORAL_TABLET | Freq: Two times a day (BID) | ORAL | Status: DC

## 2016-05-23 NOTE — Telephone Encounter (Signed)
Pt called stating she can not afford her Flecainide, it is only $38 a month but she states that is just a real struggle with her other meds.  There is not more AF funding w/[employer] and no other assistance since med is generic.  Per Dr Aundra Dubin ok to prescribe 100 mg BID and have pt cut in half to equal 50 BID so that it will last her 2 months instead of the 1 month.  Pt is aware, agreeable and verbalizes understanding.  New rx sent in

## 2016-05-28 ENCOUNTER — Other Ambulatory Visit (HOSPITAL_COMMUNITY): Payer: Self-pay | Admitting: Cardiology

## 2016-06-17 ENCOUNTER — Ambulatory Visit (INDEPENDENT_AMBULATORY_CARE_PROVIDER_SITE_OTHER): Payer: Medicare Other | Admitting: Pharmacist Clinician (PhC)/ Clinical Pharmacy Specialist

## 2016-06-17 DIAGNOSIS — I480 Paroxysmal atrial fibrillation: Secondary | ICD-10-CM | POA: Diagnosis not present

## 2016-06-17 DIAGNOSIS — Z7901 Long term (current) use of anticoagulants: Secondary | ICD-10-CM

## 2016-06-17 LAB — POCT INR: INR: 2.4

## 2016-07-29 ENCOUNTER — Encounter (INDEPENDENT_AMBULATORY_CARE_PROVIDER_SITE_OTHER): Payer: Self-pay

## 2016-07-29 ENCOUNTER — Ambulatory Visit (INDEPENDENT_AMBULATORY_CARE_PROVIDER_SITE_OTHER): Payer: Medicare Other | Admitting: Pharmacist Clinician (PhC)/ Clinical Pharmacy Specialist

## 2016-07-29 DIAGNOSIS — I480 Paroxysmal atrial fibrillation: Secondary | ICD-10-CM | POA: Diagnosis not present

## 2016-07-29 DIAGNOSIS — Z7901 Long term (current) use of anticoagulants: Secondary | ICD-10-CM | POA: Diagnosis not present

## 2016-07-29 LAB — POCT INR: INR: 2

## 2016-08-13 NOTE — Progress Notes (Signed)
[name] [address] [place], [place] [address] Phone: [phone] Subjective:    I'm seeing this patient by the request  of:  Ann Held, DO  CC: Multiple complaints  [id number]  AMIYLA [name] is a 81 y.o. female coming in with complaint of Multiple pains. When asked to specify which one is hurting her the most it was her left knee. Patient states that she's been having pain in it for quite some time. Known arthritic changes. Having pain in her left foot. Patient states that trying to compensate for the foot she is having worsening pain in the knee. Patient states that this is made it very difficult to walk. Using a cane. Rates the severity pain is 7 out of 10. Having more swelling of the lower extremity and foot on the side than the contralateral side. Patient is on chronic anticoagulation already. Patient does not remember any injury. States that she does have some mild bilateral numbness of the feet that this is been long time. Patient is concerned cousin seems to be worsening.    Past Medical History:  Diagnosis Date  . Atrial fibrillation ([place])   . Carotid artery disease ([place])   . CHF (congestive heart failure) ([place])   . Hyperlipidemia   . Hypertension   . Hypothyroidism   . Shortness of breath   . TIA (transient ischemic attack) 2001   Past Surgical History:  Procedure Laterality Date  . ABDOMINAL HYSTERECTOMY  1975   TAH,BSO  . CARDIAC CATHETERIZATION N/A 08/14/2015   Procedure: Right Heart Cath;  Surgeon: Larey Dresser, MD;  Location: [facility];  Service: Cardiovascular;  Laterality: N/A;  . carotid duplex  11/24/2012  . carotid duplex  11/01/2010  . CAROTID ENDARTERECTOMY  2001   left  . CHOLECYSTECTOMY    . DOPPLER ECHOCARDIOGRAPHY  02/13/2010   EF>55%  . DOPPLER ECHOCARDIOGRAPHY  09/29/2007  . NM MYOVIEW LTD  02/13/2010  . renal duplex  02/13/2010  . VIDEO BRONCHOSCOPY Bilateral 08/09/2015   Procedure: VIDEO  BRONCHOSCOPY WITHOUT FLUORO;  Surgeon: Collene Gobble, MD;  Location: [facility];  Service: Cardiopulmonary;  Laterality: Bilateral;   Social History   Social History  . Marital status: Married    Spouse name: N/A  . Number of children: 2  . Years of education: N/A   Occupational History  . retired    Social History Main Topics  . Smoking status: Former Smoker    Packs/day: 1.00    Years: 47.00    Types: Cigarettes    Quit date: 12/24/1999  . Smokeless tobacco: Not on file  . Alcohol use 0.0 oz/week     Comment: 1 glass of wine rarely  . Drug use: No  . Sexual activity: Not on file   Other Topics Concern  . Not on file   Social History Narrative   Originally she is from [place]. She has previously lived in [place] & moved to [place] in 1985. 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 No mold or hot tub exposure. No bird exposure.    No Known Allergies Family History  Problem Relation Age of Onset  . Heart failure Mother   . Parkinson's disease Mother   . Arthritis Mother   . Arthritis Sister   . Colon cancer Brother   . Rheumatologic disease Neg Hx   . Lung disease Neg Hx     Past medical history, social,  surgical and family history all reviewed in electronic medical record.  No pertanent information unless stated regarding to the chief complaint.   Review of Systems: No headache, visual changes, nausea, vomiting, diarrhea, constipation, dizziness, abdominal pain, skin rash, fevers, chills, night sweats, weight loss, swollen lymph nodes, body aches, joint swelling, muscle aches, chest pain, shortness of breath, mood changes.   Objective  There were no vitals taken for this visit.  General: No apparent distress alert and oriented x3 mood and affect normal, dressed appropriately.  HEENT: Pupils equal, extraocular movements intact  Respiratory: Patient's speak in full sentences and does not appear short of breath  Cardiovascular: +1 lower extremity edema,  minorly tende bilaterally , no erythema  Skin: Warm dry intact with no signs of infection or rash on extremities or on axial skeleton.  Abdomen: Soft nontender  Neuro: Cranial nerves II through XII are intact, neurovascularly intact in all extremities with 2+ DTRs and 1+ pulses dorsalis. Lymph: No lymphadenopathy of posterior or anterior cervical chain or axillae bilaterally.  Gait normal with good balance and coordination.  MSK:  Non tender with full range of motion and good stability and symmetric strength and tone of shoulders, elbows, wrist, hip, and ankles bilaterally. Arthritic changes of multiple joints Knee: Left Valgus deformity of the knee noted. Patient does have atrophy of the cavus bilaterally with no hair. Trace effusion noted Severely tender to palpation mostly over the medial joint line Lacks last 10 of flexion and extension Instability noted with valgus force Negative Mcmurray's, Apley's, and Thessalonian tests. Non painful patellar compression. Patellar glide with severe crepitus. Patellar and quadriceps tendons unremarkable. Minorly weak distally. Patient's pulses are 1+ and brisk Contralateral knee valgus deformity noted with mild instability. Same atrophy noted on this leg as well.  Procedure: Real-time Ultrasound Guided Injection of left knee Device: GE Logiq E  Ultrasound guided injection is preferred based studies that show increased duration, increased effect, greater accuracy, decreased procedural pain, increased response rate, and decreased cost with ultrasound guided versus blind injection.  Verbal informed consent obtained.  Time-out conducted.  Noted no overlying erythema, induration, or other signs of local infection.  Skin prepped in a sterile fashion.  Local anesthesia: Topical Ethyl chloride.  With sterile technique and under real time ultrasound guidance: With a 22-gauge 2 inch needle patient was injected with 4 cc of 0.5% Marcaine and aspirated 20 mL of  strawlike fluid then injected 1 cc of Kenalog 40 mg/dL. This was from a superior lateral approach.  Completed without difficulty  Pain immediately resolved suggesting accurate placement of the medication.  Advised to call if fevers/chills, erythema, induration, drainage, or persistent bleeding.  Images permanently stored and available for review in the ultrasound unit.  Impression: Technically successful ultrasound guided injection.   Impression and Recommendations:     This case required medical decision making of moderate complexity.      Note: This dictation was prepared with Dragon dictation along with smaller phrase technology. Any transcriptional errors that result from this process are unintentional.

## 2016-08-14 ENCOUNTER — Ambulatory Visit (INDEPENDENT_AMBULATORY_CARE_PROVIDER_SITE_OTHER): Payer: Medicare Other | Admitting: Family Medicine

## 2016-08-14 ENCOUNTER — Other Ambulatory Visit: Payer: Self-pay

## 2016-08-14 ENCOUNTER — Encounter: Payer: Self-pay | Admitting: Family Medicine

## 2016-08-14 VITALS — BP 126/80 | HR 93 | Wt 172.0 lb

## 2016-08-14 DIAGNOSIS — M19072 Primary osteoarthritis, left ankle and foot: Secondary | ICD-10-CM | POA: Insufficient documentation

## 2016-08-14 DIAGNOSIS — M79605 Pain in left leg: Secondary | ICD-10-CM

## 2016-08-14 DIAGNOSIS — M129 Arthropathy, unspecified: Secondary | ICD-10-CM

## 2016-08-14 DIAGNOSIS — M1712 Unilateral primary osteoarthritis, left knee: Secondary | ICD-10-CM | POA: Diagnosis not present

## 2016-08-14 DIAGNOSIS — I739 Peripheral vascular disease, unspecified: Secondary | ICD-10-CM | POA: Insufficient documentation

## 2016-08-14 MED ORDER — VITAMIN D (ERGOCALCIFEROL) 1.25 MG (50000 UNIT) PO CAPS: 50000.0000 [IU] | ORAL_CAPSULE | ORAL | 0 refills | Status: DC

## 2016-08-14 NOTE — Patient Instructions (Signed)
Good to see you  Ice is your friend Ice 20 minutes 2 times daily. Usually after activity and before bed. pennsaid pinkie amount topically 2 times daily as needed.  Stop though if it starts to make you bruise easy.  Wear the boot daily with a lot of walking for next 2 weeks.  Rigid sole shoe in the house or otherwise.  Avoid being barefoot.  Once weekly vitamin D for next 12 weeks.  We will get that test for the blood flow as well.  See me again in 3 weeks.

## 2016-08-14 NOTE — Assessment & Plan Note (Signed)
Once weekly vitamin D given, discussed home exercises, icing protocol, we discussed an Cam Walker for the next 2 weeks. Patient come back and see me again in 3 weeks. We'll ultrasound the area again to make sure patient is healing appropriately.

## 2016-08-14 NOTE — Assessment & Plan Note (Signed)
Patient does have atrophy of the cast bilaterally and has significant cardiovascular history. I do believe that some this is secondary to poor blood flow. ABI ordered today.

## 2016-08-14 NOTE — Assessment & Plan Note (Signed)
Patient given injection today and tolerated the procedure well. I do think that there is some peripheral vascular disease that is contributing and an ABI will be ordered. We discussed home exercises, icing protocol, topical anti-inflammatories but warned of potential side effects. Patient try some over-the-counter medications. Once weekly vitamin D for potential stress fracture of the foot. Patient will come back and see me again in 3 weeks.

## 2016-08-19 ENCOUNTER — Other Ambulatory Visit: Payer: Self-pay | Admitting: *Deleted

## 2016-08-19 ENCOUNTER — Telehealth: Payer: Self-pay | Admitting: Family Medicine

## 2016-08-19 DIAGNOSIS — M79605 Pain in left leg: Secondary | ICD-10-CM

## 2016-08-19 NOTE — Telephone Encounter (Signed)
Please follow up with patient on blood flow test/

## 2016-08-19 NOTE — Telephone Encounter (Signed)
Spoke to pt & got her scheduled for 08/29/2016 @ 1230pm.

## 2016-08-20 ENCOUNTER — Other Ambulatory Visit: Payer: Self-pay | Admitting: Family Medicine

## 2016-08-20 DIAGNOSIS — M79605 Pain in left leg: Secondary | ICD-10-CM

## 2016-08-29 ENCOUNTER — Ambulatory Visit (HOSPITAL_COMMUNITY)
Admission: RE | Admit: 2016-08-29 | Discharge: 2016-08-29 | Disposition: A | Discharge status: Home / Self Care | Payer: Medicare Other | Source: Ambulatory Visit | Attending: Cardiovascular Disease | Admitting: Cardiovascular Disease

## 2016-08-29 DIAGNOSIS — I739 Peripheral vascular disease, unspecified: Secondary | ICD-10-CM | POA: Diagnosis not present

## 2016-08-29 DIAGNOSIS — Z87891 Personal history of nicotine dependence: Secondary | ICD-10-CM | POA: Insufficient documentation

## 2016-08-29 DIAGNOSIS — M79605 Pain in left leg: Secondary | ICD-10-CM

## 2016-08-29 DIAGNOSIS — E785 Hyperlipidemia, unspecified: Secondary | ICD-10-CM | POA: Diagnosis not present

## 2016-08-29 DIAGNOSIS — I2510 Atherosclerotic heart disease of native coronary artery without angina pectoris: Secondary | ICD-10-CM | POA: Diagnosis not present

## 2016-08-29 DIAGNOSIS — I10 Essential (primary) hypertension: Secondary | ICD-10-CM | POA: Insufficient documentation

## 2016-08-29 DIAGNOSIS — Z8673 Personal history of transient ischemic attack (TIA), and cerebral infarction without residual deficits: Secondary | ICD-10-CM | POA: Insufficient documentation

## 2016-08-29 DIAGNOSIS — R0989 Other specified symptoms and signs involving the circulatory and respiratory systems: Secondary | ICD-10-CM | POA: Diagnosis not present

## 2016-09-08 NOTE — Progress Notes (Signed)
[name] [address] [place], [place] [address] Phone: [phone] Subjective:    CC: knee pain  f/u   [id number]  Colleen [name] is a 81 y.o. female coming in with complaint of left knee pain .  Found to have OA.  Had injection, 100% better very happy wiuth results Other problems resolved as well.      Past Medical History:  Diagnosis Date  . Atrial fibrillation ([place])   . Carotid artery disease ([place])   . CHF (congestive heart failure) ([place])   . Hyperlipidemia   . Hypertension   . Hypothyroidism   . Shortness of breath   . TIA (transient ischemic attack) 2001   Past Surgical History:  Procedure Laterality Date  . ABDOMINAL HYSTERECTOMY  1975   TAH,BSO  . CARDIAC CATHETERIZATION N/A 08/14/2015   Procedure: Right Heart Cath;  Surgeon: Larey Dresser, MD;  Location: [facility];  Service: Cardiovascular;  Laterality: N/A;  . carotid duplex  11/24/2012  . carotid duplex  11/01/2010  . CAROTID ENDARTERECTOMY  2001   left  . CHOLECYSTECTOMY    . DOPPLER ECHOCARDIOGRAPHY  02/13/2010   EF>55%  . DOPPLER ECHOCARDIOGRAPHY  09/29/2007  . NM MYOVIEW LTD  02/13/2010  . renal duplex  02/13/2010  . VIDEO BRONCHOSCOPY Bilateral 08/09/2015   Procedure: VIDEO BRONCHOSCOPY WITHOUT FLUORO;  Surgeon: Collene Gobble, MD;  Location: [facility];  Service: Cardiopulmonary;  Laterality: Bilateral;   Social History   Social History  . Marital status: Married    Spouse name: N/A  . Number of children: 2  . Years of education: N/A   Occupational History  . retired    Social History Main Topics  . Smoking status: Former Smoker    Packs/day: 1.00    Years: 47.00    Types: Cigarettes    Quit date: 12/24/1999  . Smokeless tobacco: Not on file  . Alcohol use 0.0 oz/week     Comment: 1 glass of wine rarely  . Drug use: No  . Sexual activity: Not on file   Other Topics Concern  . Not on file   Social History Narrative   Originally she is  from [place]. She has previously lived in [place] & moved to [place] in 1985. 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 No mold or hot tub exposure. No bird exposure.    No Known Allergies Family History  Problem Relation Age of Onset  . Heart failure Mother   . Parkinson's disease Mother   . Arthritis Mother   . Arthritis Sister   . Colon cancer Brother   . Rheumatologic disease Neg Hx   . Lung disease Neg Hx     Past medical history, social, surgical and family history all reviewed in electronic medical record.  No pertanent information unless stated regarding to the chief complaint.   Review of Systems: No headache, visual changes, nausea, vomiting, diarrhea, constipation, dizziness, abdominal pain, skin rash, fevers, chills, night sweats, weight loss, swollen lymph nodes, body aches, joint swelling, muscle aches, chest pain, shortness of breath, mood changes.   Objective  There were no vitals taken for this visit.  General: No apparent distress alert and oriented x3 mood and affect normal, dressed appropriately.  HEENT: Pupils equal, extraocular movements intact  Respiratory: Patient's speak in full sentences and does not appear short of breath  Cardiovascular: +1 lower extremity edema, minorly tende bilaterally , no erythema  Skin: Warm dry intact with no signs of infection or rash on extremities or on axial skeleton.  Abdomen: Soft nontender  Neuro: Cranial nerves II through XII are intact, neurovascularly intact in all extremities with 2+ DTRs and 1+ pulses dorsalis. Lymph: No lymphadenopathy of posterior or anterior cervical chain or axillae bilaterally.  Gait normal with good balance and coordination.  MSK:  Non tender with full range of motion and good stability and symmetric strength and tone of shoulders, elbows, wrist, hip, and ankles bilaterally. Arthritic changes of multiple joints Knee: Left Valgus deformity of the knee noted. Nontender  Full  ROM Instability noted with valgus force Negative Mcmurray's, Apley's, and Thessalonian tests. Non painful patellar compression. Patellar glide with severe crepitus. Patellar and quadriceps tendons unremarkable. strength symmetric Contralateral knee valgus deformity noted with mild instability. Same atrophy noted on this leg as well.  Procedure: Real-time Ultrasound Guided Injection of left knee Device: GE Logiq E  Ultrasound guided injection is preferred based studies that show increased duration, increased effect, greater accuracy, decreased procedural pain, increased response rate, and decreased cost with ultrasound guided versus blind injection.  Verbal informed consent obtained.  Time-out conducted.  Noted no overlying erythema, induration, or other signs of local infection.  Skin prepped in a sterile fashion.  Local anesthesia: Topical Ethyl chloride.  With sterile technique and under real time ultrasound guidance: With a 22-gauge 2 inch needle patient was injected with 4 cc of 0.5% Marcaine and aspirated 20 mL of strawlike fluid then injected 1 cc of Kenalog 40 mg/dL. This was from a superior lateral approach.  Completed without difficulty  Pain immediately resolved suggesting accurate placement of the medication.  Advised to call if fevers/chills, erythema, induration, drainage, or persistent bleeding.  Images permanently stored and available for review in the ultrasound unit.  Impression: Technically successful ultrasound guided injection.   Impression and Recommendations:     This case required medical decision making of moderate complexity.      Note: This dictation was prepared with Dragon dictation along with smaller phrase technology. Any transcriptional errors that result from this process are unintentional.

## 2016-09-09 ENCOUNTER — Ambulatory Visit (INDEPENDENT_AMBULATORY_CARE_PROVIDER_SITE_OTHER): Payer: Medicare Other | Admitting: Pharmacist Clinician (PhC)/ Clinical Pharmacy Specialist

## 2016-09-09 ENCOUNTER — Encounter: Payer: Self-pay | Admitting: Family Medicine

## 2016-09-09 ENCOUNTER — Ambulatory Visit (INDEPENDENT_AMBULATORY_CARE_PROVIDER_SITE_OTHER): Payer: Medicare Other | Admitting: Family Medicine

## 2016-09-09 DIAGNOSIS — M1712 Unilateral primary osteoarthritis, left knee: Secondary | ICD-10-CM | POA: Diagnosis not present

## 2016-09-09 DIAGNOSIS — Z7901 Long term (current) use of anticoagulants: Secondary | ICD-10-CM | POA: Diagnosis not present

## 2016-09-09 DIAGNOSIS — I480 Paroxysmal atrial fibrillation: Secondary | ICD-10-CM | POA: Diagnosis not present

## 2016-09-09 LAB — POCT INR: INR: 2.6

## 2016-09-09 NOTE — Assessment & Plan Note (Signed)
Much better since injection, continue conservative therapy  RTC PRN.

## 2016-09-09 NOTE — Patient Instructions (Signed)
Good to see you Ice is your friend See me when you need me.  

## 2016-10-10 ENCOUNTER — Other Ambulatory Visit (HOSPITAL_COMMUNITY): Payer: Self-pay | Admitting: Cardiology

## 2016-10-10 ENCOUNTER — Other Ambulatory Visit (HOSPITAL_COMMUNITY): Payer: Self-pay | Admitting: Adult Health

## 2016-10-21 ENCOUNTER — Ambulatory Visit (INDEPENDENT_AMBULATORY_CARE_PROVIDER_SITE_OTHER): Payer: Medicare Other | Admitting: Pharmacist Clinician (PhC)/ Clinical Pharmacy Specialist

## 2016-10-21 DIAGNOSIS — Z7901 Long term (current) use of anticoagulants: Secondary | ICD-10-CM

## 2016-10-21 DIAGNOSIS — I480 Paroxysmal atrial fibrillation: Secondary | ICD-10-CM

## 2016-10-21 LAB — POCT INR: INR: 2.4

## 2016-11-28 ENCOUNTER — Other Ambulatory Visit (HOSPITAL_COMMUNITY): Payer: Self-pay | Admitting: Cardiology

## 2016-11-28 DIAGNOSIS — I5032 Chronic diastolic (congestive) heart failure: Secondary | ICD-10-CM

## 2016-11-28 DIAGNOSIS — I5022 Chronic systolic (congestive) heart failure: Secondary | ICD-10-CM

## 2016-12-02 ENCOUNTER — Ambulatory Visit (INDEPENDENT_AMBULATORY_CARE_PROVIDER_SITE_OTHER): Payer: Medicare Other | Admitting: Pharmacist Clinician (PhC)/ Clinical Pharmacy Specialist

## 2016-12-02 DIAGNOSIS — Z7901 Long term (current) use of anticoagulants: Secondary | ICD-10-CM | POA: Diagnosis not present

## 2016-12-02 DIAGNOSIS — I480 Paroxysmal atrial fibrillation: Secondary | ICD-10-CM | POA: Diagnosis not present

## 2016-12-02 LAB — POCT INR: INR: 3.1

## 2016-12-17 ENCOUNTER — Telehealth: Payer: Self-pay | Admitting: Family Medicine

## 2016-12-17 NOTE — Telephone Encounter (Signed)
Patient is coming in 1/11.  Would like to come in sooner if possible.

## 2016-12-19 NOTE — Progress Notes (Signed)
[name] [place], [place] [address] Phone: [phone] Subjective:    CC: Left knee pain  f/u   [id number]  Colleen [name] is a 81 y.o. female coming in with complaint of left knee pain .  Found to have OA.  Patient was given an injection 5 months ago. Patient was doing 100% better at her last follow-up. Patient states now having worsening pain. Noticing some swelling. Patient states it is affecting daily activities and making it difficult to walk. Does not remember any specific injury. Feels very similar to how bad it was previously and is worsening symptoms.     Past Medical History:  Diagnosis Date  . Atrial fibrillation ([place])   . Carotid artery disease ([place])   . CHF (congestive heart failure) ([place])   . Hyperlipidemia   . Hypertension   . Hypothyroidism   . Shortness of breath   . TIA (transient ischemic attack) 2001   Past Surgical History:  Procedure Laterality Date  . ABDOMINAL HYSTERECTOMY  1975   TAH,BSO  . CARDIAC CATHETERIZATION N/A 08/14/2015   Procedure: Right Heart Cath;  Surgeon: Larey Dresser, MD;  Location: [facility];  Service: Cardiovascular;  Laterality: N/A;  . carotid duplex  11/24/2012  . carotid duplex  11/01/2010  . CAROTID ENDARTERECTOMY  2001   left  . CHOLECYSTECTOMY    . DOPPLER ECHOCARDIOGRAPHY  02/13/2010   EF>55%  . DOPPLER ECHOCARDIOGRAPHY  09/29/2007  . NM MYOVIEW LTD  02/13/2010  . renal duplex  02/13/2010  . VIDEO BRONCHOSCOPY Bilateral 08/09/2015   Procedure: VIDEO BRONCHOSCOPY WITHOUT FLUORO;  Surgeon: Collene Gobble, MD;  Location: [facility];  Service: Cardiopulmonary;  Laterality: Bilateral;   Social History   Social History  . Marital status: Married    Spouse name: N/A  . Number of children: 2  . Years of education: N/A   Occupational History  . retired    Social History Main Topics  . Smoking status: Former Smoker    Packs/day: 1.00    Years: 47.00    Types:  Cigarettes    Quit date: 12/24/1999  . Smokeless tobacco: Not on file  . Alcohol use 0.0 oz/week     Comment: 1 glass of wine rarely  . Drug use: No  . Sexual activity: Not on file   Other Topics Concern  . Not on file   Social History Narrative   Originally she is from [place]. She has previously lived in [place] & moved to [place] in 1985. 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 No mold or hot tub exposure. No bird exposure.    No Known Allergies Family History  Problem Relation Age of Onset  . Heart failure Mother   . Parkinson's disease Mother   . Arthritis Mother   . Arthritis Sister   . Colon cancer Brother   . Rheumatologic disease Neg Hx   . Lung disease Neg Hx     Past medical history, social, surgical and family history all reviewed in electronic medical record.  No pertanent information unless stated regarding to the chief complaint.   Review of Systems: No headache, visual changes, nausea, vomiting, diarrhea, constipation, dizziness, abdominal pain, skin rash, fevers, chills, night sweats, weight loss, swollen lymph nodes, body aches, joint swelling, muscle aches, chest pain, shortness of breath, mood changes.  .  .   Objective  There were no vitals taken for this visit.  Systems examined below as of 12/20/16 General: NAD A&O x3 mood, affect normal  HEENT: Pupils equal, extraocular movements intact no nystagmus Respiratory: not short of breath at rest or with speaking Cardiovascular: No lower extremity edema, non tender Skin: Warm dry intact with no signs of infection or rash on extremities or on axial skeleton. Abdomen: Soft nontender, no masses Neuro: Cranial nerves  intact, neurovascularly intact in all extremities with 2+ DTRs and 2+ pulses. Lymph: No lymphadenopathy appreciated today   Gait Antalgic gait MSK:  Non tender with full range of motion and good stability and symmetric strength and tone of shoulders, elbows, wrist, hip, and ankles  bilaterally. Arthritic changes of multiple joints Knee: Left Valgus deformity of the knee noted. Nontender  Full ROM Instability noted with valgus force significant thigh to calf ratio. Negative Mcmurray's, Apley's, and Thessalonian tests. Non painful patellar compression. Patellar glide with moderate to severe crepitus. Patellar and quadriceps tendons unremarkable. strength symmetric Contralateral knee valgus deformity noted with mild instability. Same atrophy noted on this leg as well.  Procedure: Real-time Ultrasound Guided Injection of left knee Device: GE Logiq E  Ultrasound guided injection is preferred based studies that show increased duration, increased effect, greater accuracy, decreased procedural pain, increased response rate, and decreased cost with ultrasound guided versus blind injection.  Verbal informed consent obtained.  Time-out conducted.  Noted no overlying erythema, induration, or other signs of local infection.  Skin prepped in a sterile fashion.  Local anesthesia: Topical Ethyl chloride.  With sterile technique and under real time ultrasound guidance: With a 22-gauge 2 inch needle patient was injected with 4 cc of 0.5% Marcaine and 1 cc of Kenalog 40 mg/dL. This was from a superior lateral approach.  Completed without difficulty  Pain immediately resolved suggesting accurate placement of the medication.  Advised to call if fevers/chills, erythema, induration, drainage, or persistent bleeding.  Images permanently stored and available for review in the ultrasound unit.  Impression: Technically successful ultrasound guided injection.   Impression and Recommendations:     This case required medical decision making of moderate complexity.      Note: This dictation was prepared with Dragon dictation along with smaller phrase technology. Any transcriptional errors that result from this process are unintentional.

## 2016-12-19 NOTE — Telephone Encounter (Signed)
Spoke to pt, scheduled her for 1045am 12.29.17.

## 2016-12-20 ENCOUNTER — Encounter: Payer: Self-pay | Admitting: Family Medicine

## 2016-12-20 ENCOUNTER — Ambulatory Visit (INDEPENDENT_AMBULATORY_CARE_PROVIDER_SITE_OTHER): Payer: Medicare Other | Admitting: Family Medicine

## 2016-12-20 DIAGNOSIS — M1712 Unilateral primary osteoarthritis, left knee: Secondary | ICD-10-CM

## 2016-12-20 NOTE — Patient Instructions (Addendum)
Good to see you  Ice 20 minutes 2 times daily. Usually after activity and before bed. They will call you on the brace.  I hope this helps for another 5 months or so.  Make an appointment though again in 4 weeks and if pain comes back we can try another type of injection.  Otherwise I can repeat these injections every 3 months.  Happy New Year!

## 2016-12-20 NOTE — Assessment & Plan Note (Signed)
Repeating injection given today. Tolerated the procedure well. We discussed icing regimen and home exercises. We discussed which activities doing which ones to avoid. Patient will be fitted for custom brace secondary to the increasing and instability. Hopefully patient will do well with conservative therapy. Patient could be a candidate for viscous supple mentation.

## 2017-01-02 ENCOUNTER — Ambulatory Visit: Payer: Medicare Other | Admitting: Family Medicine

## 2017-01-06 ENCOUNTER — Ambulatory Visit (INDEPENDENT_AMBULATORY_CARE_PROVIDER_SITE_OTHER): Payer: Medicare Other | Admitting: Pharmacist

## 2017-01-06 DIAGNOSIS — Z7901 Long term (current) use of anticoagulants: Secondary | ICD-10-CM

## 2017-01-06 DIAGNOSIS — I480 Paroxysmal atrial fibrillation: Secondary | ICD-10-CM | POA: Diagnosis not present

## 2017-01-06 LAB — POCT INR: INR: 2.1

## 2017-01-16 ENCOUNTER — Encounter: Payer: Self-pay | Admitting: Family Medicine

## 2017-01-16 ENCOUNTER — Ambulatory Visit (INDEPENDENT_AMBULATORY_CARE_PROVIDER_SITE_OTHER): Payer: Medicare Other | Admitting: Family Medicine

## 2017-01-16 DIAGNOSIS — M1712 Unilateral primary osteoarthritis, left knee: Secondary | ICD-10-CM | POA: Diagnosis not present

## 2017-01-16 NOTE — Assessment & Plan Note (Addendum)
Worsening symptoms. Failed all conservative therapy. We will start on viscous supplementation. Patient was to avoid any type of surgical intervention. We discussed icing regimen and home exercises still. Patient come back in 1 week for second in a series of 4 injections.

## 2017-01-16 NOTE — Progress Notes (Signed)
[name] [place], [place] [address] Phone: [phone] Subjective:    CC: Left knee pain  f/u   [id number]  Colleen [name] is a 82 y.o. female coming in with complaint of left knee pain .  Found to have OA.  Patient unfortunately is having worsening symptoms even though she was given a steroid injection in the left knee one month ago. Was doing very well for 3 weeks and then this last week started having increasing pain again. Having some mild increase in instability. Has failed all other conservative therapy.     Past Medical History:  Diagnosis Date  . Atrial fibrillation ([facility])   . Carotid artery disease ([facility] [facility])   . CHF (congestive heart failure) ([facility])   . Hyperlipidemia   . Hypertension   . Hypothyroidism   . Shortness of breath   . TIA (transient ischemic attack) 2001   Past Surgical History:  Procedure Laterality Date  . ABDOMINAL HYSTERECTOMY  1975   TAH,BSO  . CARDIAC CATHETERIZATION N/A 08/14/2015   Procedure: Right Heart Cath;  Surgeon: Larey Dresser, MD;  Location: [facility];  Service: Cardiovascular;  Laterality: N/A;  . carotid duplex  11/24/2012  . carotid duplex  11/01/2010  . CAROTID ENDARTERECTOMY  2001   left  . CHOLECYSTECTOMY    . DOPPLER ECHOCARDIOGRAPHY  02/13/2010   EF>55%  . DOPPLER ECHOCARDIOGRAPHY  09/29/2007  . NM MYOVIEW LTD  02/13/2010  . renal duplex  02/13/2010  . VIDEO BRONCHOSCOPY Bilateral 08/09/2015   Procedure: VIDEO BRONCHOSCOPY WITHOUT FLUORO;  Surgeon: Collene Gobble, MD;  Location: [facility];  Service: Cardiopulmonary;  Laterality: Bilateral;   Social History   Social History  . Marital status: Married    Spouse name: N/A  . Number of children: 2  . Years of education: N/A   Occupational History  . retired    Social History Main Topics  . Smoking status: Former Smoker    Packs/day: 1.00    Years: 47.00    Types: Cigarettes    Quit date: 12/24/1999  . Smokeless  tobacco: Never Used  . Alcohol use 0.0 oz/week     Comment: 1 glass of wine rarely  . Drug use: No  . Sexual activity: Not Asked   Other Topics Concern  . None   Social History Narrative   Originally she is from [place]. She has previously lived in [place] & moved to [place] in 1985. 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 No mold or hot tub exposure. No bird exposure.    No Known Allergies Family History  Problem Relation Age of Onset  . Heart failure Mother   . Parkinson's disease Mother   . Arthritis Mother   . Arthritis Sister   . Colon cancer Brother   . Rheumatologic disease Neg Hx   . Lung disease Neg Hx     Past medical history, social, surgical and family history all reviewed in electronic medical record.  No pertanent information unless stated regarding to the chief complaint.   Review of Systems: No headache, visual changes, nausea, vomiting, diarrhea, constipation, dizziness, abdominal pain, skin rash, fevers, chills, night sweats, weight loss, swollen lymph nodes, body aches, joint swelling, muscle aches, chest pain, shortness of breath, mood changes.  .  .   Objective  Blood pressure 122/80, pulse 60, weight 168 lb (76.2 kg), SpO2 96 %.  Systems examined below as of 01/16/17  General: NAD A&O x3 mood, affect normal  HEENT: Pupils equal, extraocular movements intact no nystagmus Respiratory: not short of breath at rest or with speaking Cardiovascular: No lower extremity edema, non tender Skin: Warm dry intact with no signs of infection or rash on extremities or on axial skeleton. Abdomen: Soft nontender, no masses Neuro: Cranial nerves  intact, neurovascularly intact in all extremities with 2+ DTRs and 2+ pulses. Lymph: No lymphadenopathy appreciated today   Gait Antalgic gait MSK:  Non tender with full range of motion and good stability and symmetric strength and tone of shoulders, elbows, wrist, hip, and ankles bilaterally. Arthritic changes of  multiple joints Knee: Left Valgus deformity of the knee noted.Continue mild atrophy of the quadricep. Increasing tenderness to palpation over the medial joint line. Lacks last 5 of flexion. Instability noted with valgus force noted again today Negative Mcmurray's, Apley's, and Thessalonian tests. Mild painful patellar compression. Patellar glide with moderate to severe crepitus. Patellar and quadriceps tendons unremarkable. strength symmetric Contralateral knee valgus deformity noted with mild instability.   After informed written and verbal consent, patient was seated on exam table. Left knee was prepped with alcohol swab and utilizing anterolateral approach, patient's left knee space was injected with15 mg/2.5 mL of Orthovisc(sodium hyaluronate) in a prefilled syringe was injected easily into the knee through a 22-gauge needle..Patient tolerated the procedure well without immediate complications.   Impression and Recommendations:     This case required medical decision making of moderate complexity.      Note: This dictation was prepared with Dragon dictation along with smaller phrase technology. Any transcriptional errors that result from this process are unintentional.

## 2017-01-16 NOTE — Patient Instructions (Signed)
Good to see you  We started orthovisc today  We will see you again next week and the following 3 weeks.  Ice is your friend.  See you next week!

## 2017-01-23 ENCOUNTER — Encounter: Payer: Self-pay | Admitting: Family Medicine

## 2017-01-23 ENCOUNTER — Ambulatory Visit (INDEPENDENT_AMBULATORY_CARE_PROVIDER_SITE_OTHER): Payer: Medicare Other | Admitting: Family Medicine

## 2017-01-23 DIAGNOSIS — M1712 Unilateral primary osteoarthritis, left knee: Secondary | ICD-10-CM | POA: Diagnosis not present

## 2017-01-23 NOTE — Assessment & Plan Note (Signed)
Second in a series of 4 injections given today. We discussed icing regimen. Continue conservative therapy. Follow-up in one week for third injection in the series of 4.

## 2017-01-23 NOTE — Patient Instructions (Signed)
Good to see you  Colleen [name] is your friend.  See you again in 1 week.

## 2017-01-23 NOTE — Progress Notes (Signed)
[name] [place], [place] [address] Phone: [phone] Subjective:    CC: Left knee pain  f/u   [id number]  Colleen [name] is a 82 y.o. female coming in with complaint of left knee pain .  Found to have OA.  Fellow conservative therapy. Here for second in a series of 4 injections for viscous supplementation. States that she has noticed some mild improvement of 5-10%.    Past Medical History:  Diagnosis Date  . Atrial fibrillation ([facility])   . Carotid artery disease ([facility] [place])   . CHF (congestive heart failure) ([facility])   . Hyperlipidemia   . Hypertension   . Hypothyroidism   . Shortness of breath   . TIA (transient ischemic attack) 2001   Past Surgical History:  Procedure Laterality Date  . ABDOMINAL HYSTERECTOMY  1975   TAH,BSO  . CARDIAC CATHETERIZATION N/A 08/14/2015   Procedure: Right Heart Cath;  Surgeon: Larey Dresser, MD;  Location: [facility];  Service: Cardiovascular;  Laterality: N/A;  . carotid duplex  11/24/2012  . carotid duplex  11/01/2010  . CAROTID ENDARTERECTOMY  2001   left  . CHOLECYSTECTOMY    . DOPPLER ECHOCARDIOGRAPHY  02/13/2010   EF>55%  . DOPPLER ECHOCARDIOGRAPHY  09/29/2007  . NM MYOVIEW LTD  02/13/2010  . renal duplex  02/13/2010  . VIDEO BRONCHOSCOPY Bilateral 08/09/2015   Procedure: VIDEO BRONCHOSCOPY WITHOUT FLUORO;  Surgeon: Collene Gobble, MD;  Location: [facility];  Service: Cardiopulmonary;  Laterality: Bilateral;   Social History   Social History  . Marital status: Married    Spouse name: N/A  . Number of children: 2  . Years of education: N/A   Occupational History  . retired    Social History Main Topics  . Smoking status: Former Smoker    Packs/day: 1.00    Years: 47.00    Types: Cigarettes    Quit date: 12/24/1999  . Smokeless tobacco: Never Used  . Alcohol use 0.0 oz/week     Comment: 1 glass of wine rarely  . Drug use: No  . Sexual activity: Not Asked   Other Topics  Concern  . None   Social History Narrative   Originally she is from [place]. She has previously lived in [place] & moved to [place] in 1985. 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 No mold or hot tub exposure. No bird exposure.    No Known Allergies Family History  Problem Relation Age of Onset  . Heart failure Mother   . Parkinson's disease Mother   . Arthritis Mother   . Arthritis Sister   . Colon cancer Brother   . Rheumatologic disease Neg Hx   . Lung disease Neg Hx     Past medical history, social, surgical and family history all reviewed in electronic medical record.  No pertanent information unless stated regarding to the chief complaint.   Review of Systems: No headache, visual changes, nausea, vomiting, diarrhea, constipation, dizziness, abdominal pain, skin rash, fevers, chills, night sweats, weight loss, swollen lymph nodes, body aches, joint swelling, muscle aches, chest pain, shortness of breath, mood changes.   .   Objective  Blood pressure 112/68, pulse 80, height 5\' 4"  (1.626 m), weight 170 lb (77.1 kg).  Systems examined below as of 01/23/17 General: NAD A&O x3 mood, affect normal  HEENT: Pupils equal, extraocular movements intact no nystagmus Respiratory: not short of breath at rest or with  speaking Cardiovascular: No lower extremity edema, non tender Skin: Warm dry intact with no signs of infection or rash on extremities or on axial skeleton. Abdomen: Soft nontender, no masses Neuro: Cranial nerves  intact, neurovascularly intact in all extremities with 2+ DTRs and 2+ pulses. Lymph: No lymphadenopathy appreciated today   Gait Antalgic gait MSK:  Non tender with full range of motion and good stability and symmetric strength and tone of shoulders, elbows, wrist, hip, and ankles bilaterally. Arthritic changes of multiple joints Knee: Left valgus deformity noted. Large thigh to calf ratio.  Tender to palpation over medial and PF joint line.  ROM  full in flexion and extension and lower leg rotation. instability with valgus force.  painful patellar compression. Patellar glide with moderate crepitus. Patellar and quadriceps tendons unremarkable. Hamstring and quadriceps strength is normal. Contralateral knee shows this is some instability as well.  After informed written and verbal consent, patient was seated on exam table. Left knee was prepped with alcohol swab and utilizing anterolateral approach, patient's left knee space was injected with15 mg/2.5 mL of Orthovisc(sodium hyaluronate) in a prefilled syringe was injected easily into the knee through a 22-gauge needle..Patient tolerated the procedure well without immediate complications.   Impression and Recommendations:     This case required medical decision making of moderate complexity.      Note: This dictation was prepared with Dragon dictation along with smaller phrase technology. Any transcriptional errors that result from this process are unintentional.

## 2017-01-30 ENCOUNTER — Ambulatory Visit (INDEPENDENT_AMBULATORY_CARE_PROVIDER_SITE_OTHER): Payer: Medicare Other | Admitting: Family Medicine

## 2017-01-30 ENCOUNTER — Encounter: Payer: Self-pay | Admitting: Family Medicine

## 2017-01-30 DIAGNOSIS — M1712 Unilateral primary osteoarthritis, left knee: Secondary | ICD-10-CM

## 2017-01-30 NOTE — Progress Notes (Signed)
[name] [place], [place] [address] Phone: [phone] Subjective:    CC: Left knee pain  f/u   [id number]  Colleen [name] is a 82 y.o. female coming in with complaint of left knee pain .  Done have arthritic changes of the knee. We discussed instability patient's has started with viscous supple mentation after failing all conservative therapy. Patient states overall doing much better. Patient states that she is having less instability. Less pain daily 2. Feels like she is also having less swelling. Still having bruising after the shots.    Past Medical History:  Diagnosis Date  . Atrial fibrillation ([facility])   . Carotid artery disease ([facility])   . CHF (congestive heart failure) ([facility])   . Hyperlipidemia   . Hypertension   . Hypothyroidism   . Shortness of breath   . TIA (transient ischemic attack) 2001   Past Surgical History:  Procedure Laterality Date  . ABDOMINAL HYSTERECTOMY  1975   TAH,BSO  . CARDIAC CATHETERIZATION N/A 08/14/2015   Procedure: Right Heart Cath;  Surgeon: Larey Dresser, MD;  Location: [facility];  Service: Cardiovascular;  Laterality: N/A;  . carotid duplex  11/24/2012  . carotid duplex  11/01/2010  . CAROTID ENDARTERECTOMY  2001   left  . CHOLECYSTECTOMY    . DOPPLER ECHOCARDIOGRAPHY  02/13/2010   EF>55%  . DOPPLER ECHOCARDIOGRAPHY  09/29/2007  . NM MYOVIEW LTD  02/13/2010  . renal duplex  02/13/2010  . VIDEO BRONCHOSCOPY Bilateral 08/09/2015   Procedure: VIDEO BRONCHOSCOPY WITHOUT FLUORO;  Surgeon: Collene Gobble, MD;  Location: [facility];  Service: Cardiopulmonary;  Laterality: Bilateral;   Social History   Social History  . Marital status: Married    Spouse name: N/A  . Number of children: 2  . Years of education: N/A   Occupational History  . retired    Social History Main Topics  . Smoking status: Former Smoker    Packs/day: 1.00    Years: 47.00    Types: Cigarettes    Quit date:  12/24/1999  . Smokeless tobacco: Never Used  . Alcohol use 0.0 oz/week     Comment: 1 glass of wine rarely  . Drug use: No  . Sexual activity: Not Asked   Other Topics Concern  . None   Social History Narrative   Originally she is from [place]. She has previously lived in [place] & moved to [place] in 1985. 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 No mold or hot tub exposure. No bird exposure.    No Known Allergies Family History  Problem Relation Age of Onset  . Heart failure Mother   . Parkinson's disease Mother   . Arthritis Mother   . Arthritis Sister   . Colon cancer Brother   . Rheumatologic disease Neg Hx   . Lung disease Neg Hx     Past medical history, social, surgical and family history all reviewed in electronic medical record.  No pertanent information unless stated regarding to the chief complaint.   Review of Systems: No headache, visual changes, nausea, vomiting, diarrhea, constipation, dizziness, abdominal pain, skin rash, fevers, chills, night sweats, weight loss, swollen lymph nodes, body aches, joint swelling, muscle aches, chest pain, shortness of breath, mood changes. .   Objective  Blood pressure 120/70, pulse 63, height 5\' 4"  (1.626 m), SpO2 92 %.  Systems examined below as of 01/30/17 General: NAD A&O x3 mood,  affect normal  HEENT: Pupils equal, extraocular movements intact no nystagmus Respiratory: not short of breath at rest or with speaking Cardiovascular: No lower extremity edema, non tender Skin: Warm dry intact with no signs of infection or rash on extremities or on axial skeleton. Abdomen: Soft nontender, no masses Neuro: Cranial nerves  intact, neurovascularly intact in all extremities with 2+ DTRs and 2+ pulses. Lymph: No lymphadenopathy appreciated today   Gait Antalgic gait MSK:  Non tender with full range of motion and good stability and symmetric strength and tone of shoulders, elbows, wrist, hip, and ankles bilaterally.  Arthritic changes of multiple joints Knee: Left valgus deformity noted. Large thigh to calf ratio.  Tender to palpation over medial and PF joint line.  ROM full in flexion and extension and lower leg rotation. instability with valgus force.  painful patellar compression. Patellar glide with moderate crepitus. Patellar and quadriceps tendons unremarkable. Hamstring and quadriceps strength is normal. Contralateral knee shows arthritic changes but no tenderness  After informed written and verbal consent, patient was seated on exam table. Left knee was prepped with alcohol swab and utilizing anterolateral approach, patient's left knee space was injected with15 mg/2.5 mL of Orthovisc(sodium hyaluronate) in a prefilled syringe was injected easily into the knee through a 22-gauge needle..Patient tolerated the procedure well without immediate complications.     Impression and Recommendations:     This case required medical decision making of moderate complexity.      Note: This dictation was prepared with Dragon dictation along with smaller phrase technology. Any transcriptional errors that result from this process are unintentional.

## 2017-01-30 NOTE — Assessment & Plan Note (Signed)
Patient finished third injection in series of 4 injections. We discussed icing regimen. Continue conservative therapy. Follow-up in one week for fourth and final injection

## 2017-01-30 NOTE — Patient Instructions (Signed)
3 down and 1 to go.  You know the drill  Ice in 6 hours See you again in 1 week for the finale!

## 2017-02-05 NOTE — Progress Notes (Signed)
[name] [address], [place] [address] Phone: [phone] Subjective:    CC: Left knee pain  f/u   [id number]  Colleen [name] is a 82 y.o. female coming in with complaint of left knee pain .  Done have arthritic changes of the knee. We discussed instability patient's has started with viscous supple mentation after failing all conservative therapy.Patient is here for third injection. Patient states Significant improvement. States that she is feeling 70-80% better.    Past Medical History:  Diagnosis Date  . Atrial fibrillation ([place])   . Carotid artery disease ([place])   . CHF (congestive heart failure) ([place])   . Hyperlipidemia   . Hypertension   . Hypothyroidism   . Shortness of breath   . TIA (transient ischemic attack) 2001   Past Surgical History:  Procedure Laterality Date  . ABDOMINAL HYSTERECTOMY  1975   TAH,BSO  . CARDIAC CATHETERIZATION N/A 08/14/2015   Procedure: Right Heart Cath;  Surgeon: Larey Dresser, MD;  Location: [facility];  Service: Cardiovascular;  Laterality: N/A;  . carotid duplex  11/24/2012  . carotid duplex  11/01/2010  . CAROTID ENDARTERECTOMY  2001   left  . CHOLECYSTECTOMY    . DOPPLER ECHOCARDIOGRAPHY  02/13/2010   EF>55%  . DOPPLER ECHOCARDIOGRAPHY  09/29/2007  . NM MYOVIEW LTD  02/13/2010  . renal duplex  02/13/2010  . VIDEO BRONCHOSCOPY Bilateral 08/09/2015   Procedure: VIDEO BRONCHOSCOPY WITHOUT FLUORO;  Surgeon: Collene Gobble, MD;  Location: [facility];  Service: Cardiopulmonary;  Laterality: Bilateral;   Social History   Social History  . Marital status: Married    Spouse name: N/A  . Number of children: 2  . Years of education: N/A   Occupational History  . retired    Social History Main Topics  . Smoking status: Former Smoker    Packs/day: 1.00    Years: 47.00    Types: Cigarettes    Quit date: 12/24/1999  . Smokeless tobacco: Never Used  . Alcohol use 0.0 oz/week   Comment: 1 glass of wine rarely  . Drug use: No  . Sexual activity: Not on file   Other Topics Concern  . Not on file   Social History Narrative   Originally she is from [place]. She has previously lived in [place] & moved to [place] in 1985. 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 No mold or hot tub exposure. No bird exposure.    No Known Allergies Family History  Problem Relation Age of Onset  . Heart failure Mother   . Parkinson's disease Mother   . Arthritis Mother   . Arthritis Sister   . Colon cancer Brother   . Rheumatologic disease Neg Hx   . Lung disease Neg Hx     Past medical history, social, surgical and family history all reviewed in electronic medical record.  No pertanent information unless stated regarding to the chief complaint.   Review of Systems: No headache, visual changes, nausea, vomiting, diarrhea, constipation, dizziness, abdominal pain, skin rash, fevers, chills, night sweats, weight loss, swollen lymph nodes, body aches, joint swelling, chest pain, shortness of breath, mood changes. Positive muscle aches   Objective  Blood pressure 132/80, pulse 64, height 5\' 4"  (1.626 m), weight 168 lb (76.2 kg).  Systems examined below as of 02/06/17 General: NAD A&O x3 mood, affect normal  HEENT: Pupils equal, extraocular movements intact no nystagmus Respiratory: not short  of breath at rest or with speaking Cardiovascular: No lower extremity edema, non tender Skin: Warm dry intact with no signs of infection or rash on extremities or on axial skeleton. Abdomen: Soft nontender, no masses Neuro: Cranial nerves  intact, neurovascularly intact in all extremities with 2+ DTRs and 2+ pulses. Lymph: No lymphadenopathy appreciated today   Gait Antalgic gait MSK:  Non tender with full range of motion and good stability and symmetric strength and tone of shoulders, elbows, wrist, hip, and ankles bilaterally. Arthritic changes of multiple joints Knee:  Left valgus deformity noted.  Very mild tenderness noted ROM full in flexion and extension and lower leg rotation. instability with valgus force.  painful patellar compression. Patellar glide with moderate crepitus. Patellar and quadriceps tendons unremarkable. Hamstring and quadriceps strength is normal. Contralateral knee shows arthritic changes but no tenderness  After informed written and verbal consent, patient was seated on exam table. Left knee was prepped with alcohol swab and utilizing anterolateral approach, patient's left knee space was injected with15 mg/2.5 mL of Orthovisc(sodium hyaluronate) in a prefilled syringe was injected easily into the knee through a 22-gauge needle..Patient tolerated the procedure well without immediate complications.    Impression and Recommendations:     This case required medical decision making of moderate complexity.      Note: This dictation was prepared with Dragon dictation along with smaller phrase technology. Any transcriptional errors that result from this process are unintentional.

## 2017-02-06 ENCOUNTER — Ambulatory Visit (INDEPENDENT_AMBULATORY_CARE_PROVIDER_SITE_OTHER): Payer: Medicare Other | Admitting: Family Medicine

## 2017-02-06 ENCOUNTER — Encounter: Payer: Self-pay | Admitting: Family Medicine

## 2017-02-06 DIAGNOSIS — M1712 Unilateral primary osteoarthritis, left knee: Secondary | ICD-10-CM

## 2017-02-06 NOTE — Patient Instructions (Signed)
You did it! You get a break from me.  See me again in 4 weeks if needed otherwise see me when you need me.

## 2017-02-06 NOTE — Assessment & Plan Note (Signed)
Finished series today. Tolerated procedure well. We discussed following up in 4 weeks for 6 symptoms otherwise follow-up as needed. Has some very well with conservative therapy and the viscous supple mentation.

## 2017-02-17 ENCOUNTER — Ambulatory Visit (INDEPENDENT_AMBULATORY_CARE_PROVIDER_SITE_OTHER): Payer: Medicare Other | Admitting: Pharmacist Clinician (PhC)/ Clinical Pharmacy Specialist

## 2017-02-17 DIAGNOSIS — Z7901 Long term (current) use of anticoagulants: Secondary | ICD-10-CM | POA: Diagnosis not present

## 2017-02-17 DIAGNOSIS — I480 Paroxysmal atrial fibrillation: Secondary | ICD-10-CM | POA: Diagnosis not present

## 2017-02-17 LAB — POCT INR: INR: 2.8

## 2017-03-31 ENCOUNTER — Ambulatory Visit (INDEPENDENT_AMBULATORY_CARE_PROVIDER_SITE_OTHER): Payer: Medicare Other | Admitting: Pharmacist

## 2017-03-31 DIAGNOSIS — I480 Paroxysmal atrial fibrillation: Secondary | ICD-10-CM | POA: Diagnosis not present

## 2017-03-31 DIAGNOSIS — Z7901 Long term (current) use of anticoagulants: Secondary | ICD-10-CM

## 2017-03-31 LAB — POCT INR: INR: 2.6

## 2017-04-02 ENCOUNTER — Other Ambulatory Visit (HOSPITAL_COMMUNITY): Payer: Self-pay | Admitting: Adult Health

## 2017-04-04 ENCOUNTER — Other Ambulatory Visit: Payer: Self-pay | Admitting: *Deleted

## 2017-04-04 MED ORDER — WARFARIN SODIUM 5 MG PO TABS: 5.0000 mg | ORAL_TABLET | Freq: Every day | ORAL | 3 refills | Status: DC

## 2017-04-09 ENCOUNTER — Ambulatory Visit (INDEPENDENT_AMBULATORY_CARE_PROVIDER_SITE_OTHER): Payer: Medicare Other | Admitting: Cardiovascular Disease

## 2017-04-09 ENCOUNTER — Encounter: Payer: Self-pay | Admitting: Cardiovascular Disease

## 2017-04-09 DIAGNOSIS — I5032 Chronic diastolic (congestive) heart failure: Secondary | ICD-10-CM

## 2017-04-09 DIAGNOSIS — I10 Essential (primary) hypertension: Secondary | ICD-10-CM | POA: Diagnosis not present

## 2017-04-09 DIAGNOSIS — E7800 Pure hypercholesterolemia, unspecified: Secondary | ICD-10-CM | POA: Diagnosis not present

## 2017-04-09 MED ORDER — LISINOPRIL 20 MG PO TABS: 20.0000 mg | ORAL_TABLET | Freq: Two times a day (BID) | ORAL | 6 refills | Status: AC

## 2017-04-09 NOTE — Progress Notes (Signed)
04/09/2017 Colleen [name]   03/24/1935  [id number]  Primary Physician Ann Held, DO Primary Cardiologist: Lorretta Harp MD Lupe Carney, [place]  HPI:  The patient is a 82 year old mildly to moderately overweight married Caucasian female, the mother of 2, grandmother to 5 grandchildren, whom I last saw   11/08/15.Marland Kitchen She has a history of PVOD, status post left carotid endarterectomy in the past preceded by a TIA approximately 10-12 years ago by Dr. Sherren Mocha Early. We have been following her Dopplers here in our office. Her most recent carotid Dopplers, performed November 01, 2011, revealed moderate right ICA stenosis with a widely patent left endarterectomy site. Other problems include hypertension and hyperlipidemia. She quit smoking approximately 20 years ago. She denies chest pain or shortness of breath. Echo and Myoview performed February 2011 were normal. Dr. Chalmers Cater follows her lipid profile. Since I saw her in the office back in October she did develop chest pain and was admitted to [facility]20[facility]. She ruled out for myocardial infarction. A Myoview stress test was low risk and nonischemic with normal LV function. She has had no recurrent symptoms. She had retinal surgery performed as an outpatient 07/12/15 and was somewhat inactive after that time. She gradually developed increasing shortness of breath at rest and with exertion. She was seen in the emergency room 07/22/15 where chest x-ray showed subtle interstitial edema. BNP was elevated at 394. Troponins were negative. A 2-D echo performed in August showed normal LV systolic function. She underwent right heart cath by Dr. Aundra Dubin 08/14/15 revealing pulmonary hypertension with mean wedge pressure 29 and prominent V waves. She ultimately was diuresed. She also was diagnosed with an autoimmune lung disease and had a course of prednisone which led to peripheral edema in addition to her amlodipine. The amlodipine was discontinued.  Her Toprol was changed to atenolol and her diuretics adjusted. Her atenolol was subsequently discontinued because of bradycardia. She no longer requires supplemental oxygen. Since I saw her last she has been doing well. She denies chest pain or shortness of breath. Her major issues are with more difficult to control hypertension in the recent months. Her primary care physician did add clonidine 0.1 mg a day and doubled her lisinopril dose.   Current Outpatient Prescriptions  Medication Sig Dispense Refill  . cloNIDine (CATAPRES) 0.1 MG tablet Take 0.1 mg by mouth as directed.    . flecainide (TAMBOCOR) 100 MG tablet Take 0.5 tablets (50 mg total) by mouth 2 (two) times daily. 60 tablet 6  . furosemide (LASIX) 40 MG tablet Take 1 tablet (40 mg total) by mouth daily. Will take extra 40mg  PRN for swelling 30 tablet   . levothyroxine (SYNTHROID, LEVOTHROID) 100 MCG tablet Take 100 mcg by mouth daily before breakfast.     . lisinopril (PRINIVIL,ZESTRIL) 20 MG tablet Take 1 tablet (20 mg total) by mouth 2 (two) times daily. 60 tablet 6  . niacin 500 MG tablet Take 500 mg by mouth every evening. otc    . warfarin (COUMADIN) 5 MG tablet Take 1 tablet (5 mg total) by mouth daily. Or As Directed by Coumadin Clinic 30 tablet 3   No current facility-administered medications for this visit.     No Known Allergies  Social History   Social History  . Marital status: Married    Spouse name: N/A  . Number of children: 2  . Years of education: N/A   Occupational History  . retired  Social History Main Topics  . Smoking status: Former Smoker    Packs/day: 1.00    Years: 47.00    Types: Cigarettes    Quit date: 12/24/1999  . Smokeless tobacco: Never Used  . Alcohol use 0.0 oz/week     Comment: 1 glass of wine rarely  . Drug use: No  . Sexual activity: Not on file   Other Topics Concern  . Not on file   Social History Narrative   Originally she is from [place]. She has previously lived in [place] &  moved to [place] in 1985. 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 No mold or hot tub exposure. No bird exposure.      Review of Systems: General: negative for chills, fever, night sweats or weight changes.  Cardiovascular: negative for chest pain, dyspnea on exertion, edema, orthopnea, palpitations, paroxysmal nocturnal dyspnea or shortness of breath Dermatological: negative for rash Respiratory: negative for cough or wheezing Urologic: negative for hematuria Abdominal: negative for nausea, vomiting, diarrhea, bright red blood per rectum, melena, or hematemesis Neurologic: negative for visual changes, syncope, or dizziness All other systems reviewed and are otherwise negative except as noted above.    Blood pressure (!) 165/81, pulse 75, height 5\' 6"  (1.676 m), weight 165 lb (74.8 kg), SpO2 93 %.  General appearance: alert and no distress Neck: no adenopathy, no carotid bruit, no JVD, supple, symmetrical, trachea midline and thyroid not enlarged, symmetric, no tenderness/mass/nodules Lungs: clear to auscultation bilaterally Heart: regular rate and rhythm, S1, S2 normal, no murmur, click, rub or gallop Extremities: extremities normal, atraumatic, no cyanosis or edema  EKG not performed today  ASSESSMENT AND PLAN:   Hyperlipidemia History of hyperlipidemia not on statin therapy followed by her PCP  Essential hypertension History of hypertension with blood pressure measured today 165/81. Blood pressure has been recently more difficult to control. She did show me her blood pressure log which confirmed this. She was put on clonidine 0.1 mg a day by her PCP. Her high blood pressures typically in the evening hours. Her lisinopril was increased as well from 10-20 mg a day. I'm going to increase her lisinopril to twice a day. She is going to keep a blood pressure log on daily basis and will see Erasmo Downer back in the office in 4 weeks for follow-up.  Chronic diastolic CHF  (congestive heart failure) (HCC) Chronic diastolic heart failure on diuretics followed by Dr. Aundra Dubin .      Lorretta Harp MD FACP,FACC,FAHA, [facility] 04/09/2017 5:07 PM

## 2017-04-09 NOTE — Assessment & Plan Note (Signed)
History of hypertension with blood pressure measured today 165/81. Blood pressure has been recently more difficult to control. She did show me her blood pressure log which confirmed this. She was put on clonidine 0.1 mg a day by her PCP. Her high blood pressures typically in the evening hours. Her lisinopril was increased as well from 10-20 mg a day. I'm going to increase her lisinopril to twice a day. She is going to keep a blood pressure log on daily basis and will see Colleen [name] back in the office in 4 weeks for follow-up.

## 2017-04-09 NOTE — Assessment & Plan Note (Signed)
History of hyperlipidemia not on statin therapy followed by her PCP 

## 2017-04-09 NOTE — Assessment & Plan Note (Signed)
Chronic diastolic heart failure on diuretics followed by Dr. Aundra Dubin .

## 2017-04-09 NOTE — Patient Instructions (Signed)
Medication Instructions: Increase Lisinopril to 20 mg twice daily.   Follow-Up: Your physician recommends that you schedule a follow-up appointment in: 1 month with [facility] Clinic for BP Check. Your physician has requested that you regularly monitor and record your blood pressure readings at home. Please use the same machine at the same time of day to check your readings and record them to bring to your follow-up visit.  Your physician wants you to follow-up in: 6 months with Dr. Gwenlyn Found. You will receive a reminder letter in the mail two months in advance. If you don't receive a letter, please call our office to schedule the follow-up appointment.  If you need a refill on your cardiac medications before your next appointment, please call your pharmacy.

## 2017-04-11 ENCOUNTER — Telehealth: Payer: Self-pay | Admitting: Cardiovascular Disease

## 2017-04-11 NOTE — Telephone Encounter (Signed)
Message routed to MD & clinical pharmacy staff

## 2017-04-11 NOTE — Telephone Encounter (Signed)
Spoke with patient, will have 3 teeth extracted on Tuesday April 24.  Ok per protocol to hold for 4 days prior and restart day after procedure.  Letter for dentist left at front desk for patient to pick up.

## 2017-04-11 NOTE — Telephone Encounter (Signed)
Pt says she needs a note stating that she have been off of her Warfarin for 4 days so she can have dental work. Pt says she will pick up the note.

## 2017-05-12 ENCOUNTER — Ambulatory Visit (INDEPENDENT_AMBULATORY_CARE_PROVIDER_SITE_OTHER): Payer: Medicare Other | Admitting: Pharmacist

## 2017-05-12 DIAGNOSIS — Z7901 Long term (current) use of anticoagulants: Secondary | ICD-10-CM | POA: Diagnosis not present

## 2017-05-12 DIAGNOSIS — I480 Paroxysmal atrial fibrillation: Secondary | ICD-10-CM

## 2017-05-12 LAB — POCT INR: INR: 2.3

## 2017-05-14 ENCOUNTER — Telehealth: Payer: Self-pay | Admitting: Pharmacist

## 2017-05-14 NOTE — Telephone Encounter (Signed)
Patient presented to coumadin clinic for INR follow up and brought BP reading for the last month.   BP back to 113-166/52-78 and not need to use clonidine 0.1 the hold month.  No changes in therapy at this time.    Encouraged to call clinic if additional problem with BP trending upward again. Will check BP during next INR visit.

## 2017-06-20 ENCOUNTER — Other Ambulatory Visit (HOSPITAL_COMMUNITY): Payer: Self-pay | Admitting: Cardiology

## 2017-06-26 ENCOUNTER — Other Ambulatory Visit (HOSPITAL_COMMUNITY): Payer: Self-pay | Admitting: Cardiology

## 2017-06-30 ENCOUNTER — Ambulatory Visit (INDEPENDENT_AMBULATORY_CARE_PROVIDER_SITE_OTHER): Payer: Medicare Other | Admitting: Pharmacist

## 2017-06-30 DIAGNOSIS — Z7901 Long term (current) use of anticoagulants: Secondary | ICD-10-CM

## 2017-06-30 DIAGNOSIS — I480 Paroxysmal atrial fibrillation: Secondary | ICD-10-CM | POA: Diagnosis not present

## 2017-06-30 LAB — POCT INR: INR: 3.2

## 2017-06-30 NOTE — Telephone Encounter (Signed)
Followed by berry

## 2017-08-11 ENCOUNTER — Ambulatory Visit (INDEPENDENT_AMBULATORY_CARE_PROVIDER_SITE_OTHER): Payer: Medicare Other | Admitting: Pharmacist Clinician (PhC)/ Clinical Pharmacy Specialist

## 2017-08-11 DIAGNOSIS — I480 Paroxysmal atrial fibrillation: Secondary | ICD-10-CM

## 2017-08-11 DIAGNOSIS — Z7901 Long term (current) use of anticoagulants: Secondary | ICD-10-CM

## 2017-08-11 LAB — POCT INR: INR: 5.1

## 2017-08-26 ENCOUNTER — Ambulatory Visit (INDEPENDENT_AMBULATORY_CARE_PROVIDER_SITE_OTHER): Payer: Medicare Other | Admitting: Pharmacist Clinician (PhC)/ Clinical Pharmacy Specialist

## 2017-08-26 DIAGNOSIS — I480 Paroxysmal atrial fibrillation: Secondary | ICD-10-CM

## 2017-08-26 DIAGNOSIS — Z7901 Long term (current) use of anticoagulants: Secondary | ICD-10-CM | POA: Diagnosis not present

## 2017-08-26 LAB — POCT INR: INR: 2.7

## 2017-09-22 ENCOUNTER — Ambulatory Visit (INDEPENDENT_AMBULATORY_CARE_PROVIDER_SITE_OTHER): Payer: Medicare Other | Admitting: Pharmacist

## 2017-09-22 DIAGNOSIS — I480 Paroxysmal atrial fibrillation: Secondary | ICD-10-CM | POA: Diagnosis not present

## 2017-09-22 DIAGNOSIS — Z7901 Long term (current) use of anticoagulants: Secondary | ICD-10-CM

## 2017-09-22 LAB — POCT INR: INR: 2.2

## 2017-10-01 ENCOUNTER — Ambulatory Visit (INDEPENDENT_AMBULATORY_CARE_PROVIDER_SITE_OTHER): Payer: Medicare Other | Admitting: Family Medicine

## 2017-10-01 ENCOUNTER — Other Ambulatory Visit: Payer: Self-pay | Admitting: *Deleted

## 2017-10-01 ENCOUNTER — Encounter: Payer: Self-pay | Admitting: Family Medicine

## 2017-10-01 VITALS — BP 130/80 | HR 64 | Wt 164.0 lb

## 2017-10-01 DIAGNOSIS — M545 Low back pain, unspecified: Secondary | ICD-10-CM

## 2017-10-01 DIAGNOSIS — I714 Abdominal aortic aneurysm, without rupture, unspecified: Secondary | ICD-10-CM

## 2017-10-01 DIAGNOSIS — M549 Dorsalgia, unspecified: Secondary | ICD-10-CM

## 2017-10-01 MED ORDER — METHYLPREDNISOLONE ACETATE 80 MG/ML IJ SUSP
80.0000 mg | Freq: Once | INTRAMUSCULAR | Status: AC
  Administered 2017-10-01: 80 mg via INTRAMUSCULAR

## 2017-10-01 MED ORDER — KETOROLAC TROMETHAMINE 60 MG/2ML IM SOLN
60.0000 mg | Freq: Once | INTRAMUSCULAR | Status: AC
  Administered 2017-10-01: 60 mg via INTRAMUSCULAR

## 2017-10-01 MED ORDER — TIZANIDINE HCL 2 MG PO CAPS: 2.0000 mg | ORAL_CAPSULE | Freq: Three times a day (TID) | ORAL | 0 refills | Status: DC | PRN

## 2017-10-01 NOTE — Patient Instructions (Signed)
Good to see you  I am sorry you are hurting 2 injections today to help  Zanaflex up to 3 times a day but be careful at home.  Ice 20 minutes 2 times daily. Usually after activity and before bed. Can use back brace but later in the day  If it worsens please call or seek medical attention immediatley.  If you can as well xray downstairs would be a good idea I wan tot see you again in 7-10 days

## 2017-10-01 NOTE — Telephone Encounter (Signed)
Per dr Tamala Julian, send zanaflex 2mg  TID PRN #90.

## 2017-10-01 NOTE — Assessment & Plan Note (Signed)
Worsening symptoms at this time. Minimal spinous process tenderness. I feel that an x-ray is needed especially within getting worse over the course last week here. Discussed with patient about different differential including possible compression fracture. Patient will get the x-rays. Given 2 injections today that I think will be beneficial for some of the pain. Patient given a muscle relaxer at home. No likely clot with patient being on Coumadin and well-controlled. We discussed the possibility of GI related diseases that could be contributing to some of the discomfort and patient will watch for any changes in bowel movement. Patient will be coming back within the next week otherwise. Patient is to seek medical attention if any worsening. Would consider laboratory workup if continues. No abdominal pain noted today.

## 2017-10-01 NOTE — Progress Notes (Signed)
[name] [address], [place] [address] Phone: [phone] Subjective:    I'm seeing this patient by the request  of:    CC: Low back pain  [id number]  Colleen [name] is a 82 y.o. female coming in for back pain. She said the pain started last Saturday after going grocery shopping. She is in constant pain and is using a heating pad to sleep. She has also using Tylenol. Denies any radiating pain into the leg but does note that the pain does move upward toward her thoracic spine and shoulders. Patient denies it radiating down the legs, denies any weakness. Patient does not remember any true injury. Rates the severity pain is 8 out of 10. Patient had leftover pain medication from a dental procedure that she had to take secondary to the discomfort. Seems to be worsening slowly.      Past Medical History:  Diagnosis Date  . Atrial fibrillation ([place])   . Carotid artery disease ([place])   . CHF (congestive heart failure) ([place])   . Hyperlipidemia   . Hypertension   . Hypothyroidism   . Shortness of breath   . TIA (transient ischemic attack) 2001   Past Surgical History:  Procedure Laterality Date  . ABDOMINAL HYSTERECTOMY  1975   TAH,BSO  . CARDIAC CATHETERIZATION N/A 08/14/2015   Procedure: Right Heart Cath;  Surgeon: Larey Dresser, MD;  Location: [facility];  Service: Cardiovascular;  Laterality: N/A;  . carotid duplex  11/24/2012  . carotid duplex  11/01/2010  . CAROTID ENDARTERECTOMY  2001   left  . CHOLECYSTECTOMY    . DOPPLER ECHOCARDIOGRAPHY  02/13/2010   EF>55%  . DOPPLER ECHOCARDIOGRAPHY  09/29/2007  . NM MYOVIEW LTD  02/13/2010  . renal duplex  02/13/2010  . VIDEO BRONCHOSCOPY Bilateral 08/09/2015   Procedure: VIDEO BRONCHOSCOPY WITHOUT FLUORO;  Surgeon: Collene Gobble, MD;  Location: [facility];  Service: Cardiopulmonary;  Laterality: Bilateral;   Social History   Social History  . Marital status: Married    Spouse  name: N/A  . Number of children: 2  . Years of education: N/A   Occupational History  . retired    Social History Main Topics  . Smoking status: Former Smoker    Packs/day: 1.00    Years: 47.00    Types: Cigarettes    Quit date: 12/24/1999  . Smokeless tobacco: Never Used  . Alcohol use 0.0 oz/week     Comment: 1 glass of wine rarely  . Drug use: No  . Sexual activity: Not Asked   Other Topics Concern  . None   Social History Narrative   Originally she is from [place]. She has previously lived in [place] & moved to [place] in 1985. 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 No mold or hot tub exposure. No bird exposure.    No Known Allergies Family History  Problem Relation Age of Onset  . Heart failure Mother   . Parkinson's disease Mother   . Arthritis Mother   . Arthritis Sister   . Colon cancer Brother   . Rheumatologic disease Neg Hx   . Lung disease Neg Hx      Past medical history, social, surgical and family history all reviewed in electronic medical record.  No pertanent information unless stated regarding to the chief complaint.   Review of Systems:Review of systems updated and as accurate as of 10/01/17  No headache,  visual changes, nausea, vomiting, diarrhea, constipation, dizziness, abdominal pain, skin rash, fevers, chills, night sweats, weight loss, swollen lymph nodes, body aches, joint swelling,  chest pain, shortness of breath, mood changes. Positive muscle aches  Objective  Blood pressure 130/80, pulse 64, weight 164 lb (74.4 kg), SpO2 96 %. Systems examined below as of 10/01/17   General: No apparent distress alert and oriented x3 mood and affect normal, dressed appropriately.  HEENT: Pupils equal, extraocular movements intact  Respiratory: Patient's speak in full sentences and does not appear short of breath  Cardiovascular: No lower extremity edema, non tender, no erythema  Skin: Warm dry intact with no signs of infection or rash on  extremities or on axial skeleton.  Abdomen: Soft nontender  Neuro: Cranial nerves II through XII are intact, neurovascularly intact in all extremities with 2+ DTRs and 2+ pulses.  Lymph: No lymphadenopathy of posterior or anterior cervical chain or axillae bilaterally.  Gait Antalgic with the aid of a cane MSK:  Mild tender with full range of motion and good stability and symmetric strength and tone of shoulders, elbows, wrist, hip, knee and ankles bilaterally. Arthritic changes of multiple joints Back Exam:  Inspection: Significant kyphosis as well as degenerative scoliosis Motion: Flexion 30 deg, Extension 25 deg, Side Bending to 35 deg bilaterally,  Rotation to 30 deg bilaterally  SLR laying: Negative  XSLR laying: Negative  Palpable tenderness: Severe diffuse tenderness of the right side of the paraspinal musculature from thoracic to sacrum area. Minimal pain over the spinal process.. FABER: Tightness bilaterally. Sensory change: Gross sensation intact to all lumbar and sacral dermatomes.  Reflexes: 2+ at both patellar tendons, 2+ at achilles tendons, Babinski's downgoing.  Strength at foot  Plantar-flexion: 5/5 Dorsi-flexion: 5/5 Eversion: 5/5 Inversion: 5/5  Leg strength  Quad: 5/5 Hamstring: 4/5 Hip flexor: 4/5 Hip abductors: 4/5 .    Impression and Recommendations:     This case required medical decision making of moderate complexity.      Note: This dictation was prepared with Dragon dictation along with smaller phrase technology. Any transcriptional errors that result from this process are unintentional.

## 2017-10-04 ENCOUNTER — Encounter (HOSPITAL_COMMUNITY): Payer: Self-pay | Admitting: Emergency Medicine

## 2017-10-04 ENCOUNTER — Emergency Department (HOSPITAL_COMMUNITY)
Admission: EM | Admit: 2017-10-04 | Discharge: 2017-10-04 | Disposition: A | Discharge status: Home / Self Care | Payer: Medicare Other | Attending: Emergency Medicine | Admitting: Emergency Medicine

## 2017-10-04 DIAGNOSIS — M549 Dorsalgia, unspecified: Secondary | ICD-10-CM | POA: Insufficient documentation

## 2017-10-04 DIAGNOSIS — Z5321 Procedure and treatment not carried out due to patient leaving prior to being seen by health care provider: Secondary | ICD-10-CM | POA: Diagnosis not present

## 2017-10-04 NOTE — ED Notes (Signed)
Pt reported to registration that she was leaving.  ?

## 2017-10-04 NOTE — ED Triage Notes (Signed)
Pt states she is having lower back pain and saw her Dr on Wednesday  Pt states she was given a muscle relaxer and was told to use ice on it several times a day  Pt states she is not getting any relief with the medication and ice  Pt says she was supposed to go back and have xrays done on Thursday but was unable to get there due to the weather  Pt states she tried to call them back on Friday but could not get through   Pt states she was told to come here if the pain got worse

## 2017-10-06 ENCOUNTER — Telehealth: Payer: Self-pay | Admitting: Family Medicine

## 2017-10-06 ENCOUNTER — Ambulatory Visit (INDEPENDENT_AMBULATORY_CARE_PROVIDER_SITE_OTHER)
Admission: RE | Admit: 2017-10-06 | Discharge: 2017-10-06 | Disposition: A | Discharge status: Home / Self Care | Payer: Medicare Other | Source: Ambulatory Visit | Attending: Family Medicine | Admitting: Family Medicine

## 2017-10-06 DIAGNOSIS — M549 Dorsalgia, unspecified: Secondary | ICD-10-CM

## 2017-10-06 DIAGNOSIS — M5416 Radiculopathy, lumbar region: Secondary | ICD-10-CM

## 2017-10-06 DIAGNOSIS — I714 Abdominal aortic aneurysm, without rupture, unspecified: Secondary | ICD-10-CM

## 2017-10-06 NOTE — Telephone Encounter (Signed)
She was supposed to get a xray at last visit.  Can she come get that done  Also I could do a prednisone burst to see if that will help but will likely need to watch her INR close.  Otherwise after xray we can get MRI.

## 2017-10-06 NOTE — Telephone Encounter (Signed)
Pt called and was informed by Colleen [name] to call if she was not any better, and she is not, she tried to get through Friday but we did not have phones, she did go to the ER on Saturday but they were so busy she could not sit there she was in so much pain, she is going today to get Xrays done.

## 2017-10-06 NOTE — Telephone Encounter (Signed)
Discussed with pt.  Entered order for US Abdomen Complete & MRI lumbar.  [facility] will contact pt to schedule appt.

## 2017-10-07 ENCOUNTER — Other Ambulatory Visit: Payer: Self-pay | Admitting: Cardiovascular Disease

## 2017-10-07 NOTE — Addendum Note (Signed)
Addended by: Douglass Rivers T on: 10/07/2017 02:54 PM   Modules accepted: Orders

## 2017-10-08 ENCOUNTER — Ambulatory Visit
Admission: RE | Admit: 2017-10-08 | Discharge: 2017-10-08 | Disposition: A | Discharge status: Home / Self Care | Payer: Medicare Other | Source: Ambulatory Visit | Attending: Family Medicine | Admitting: Family Medicine

## 2017-10-08 DIAGNOSIS — M5416 Radiculopathy, lumbar region: Secondary | ICD-10-CM

## 2017-10-09 ENCOUNTER — Ambulatory Visit: Payer: Medicare Other | Admitting: Family Medicine

## 2017-10-09 ENCOUNTER — Telehealth: Payer: Self-pay | Admitting: *Deleted

## 2017-10-09 ENCOUNTER — Other Ambulatory Visit: Payer: Self-pay

## 2017-10-09 DIAGNOSIS — M5416 Radiculopathy, lumbar region: Secondary | ICD-10-CM

## 2017-10-09 MED ORDER — TRAMADOL HCL 50 MG PO TABS: 50.0000 mg | ORAL_TABLET | Freq: Three times a day (TID) | ORAL | 0 refills | Status: DC | PRN

## 2017-10-09 NOTE — Telephone Encounter (Signed)
Clearance to hold Warfarin received from [facility] Phone [phone]  Fax [phone]  Patient needs Lumbar ESI and requesting holding Warfarin for 4 days. Will be scheduled after response received.  Will forward to Phamr D for review

## 2017-10-09 NOTE — Progress Notes (Signed)
Spoke with patient per result. She is ok with getting the epidural and would like something else for her pain. Spoke with Dr. Tamala Julian who wrote rx for patient. Called patient back as she was wondering if she still needed Aortic US. Per verbal from Dr. Tamala Julian he wants her to get the [place] and if she is having an increase in pain she is to go to the ED. Patient states that she did try to go in this weekend and was unable to be seen as there were so many people in the ED. She declined care and went home. Patient is fine with plan to get epidural, [place] and use pain medication at this time.

## 2017-10-10 NOTE — Telephone Encounter (Signed)
Per office protocol patient can hold warfarin for 4 days prior to lumbar ESI.    CHADS2-VASc score is 6 (CHF, hypertension, age x 2, CAD, female)  Please have patient re-start warfarin either evening of procedure or day after at MD discretion.  Response faxed to [facility].

## 2017-10-20 ENCOUNTER — Telehealth: Payer: Self-pay

## 2017-10-20 ENCOUNTER — Ambulatory Visit
Admission: RE | Admit: 2017-10-20 | Discharge: 2017-10-20 | Disposition: A | Discharge status: Home / Self Care | Payer: Medicare Other | Source: Ambulatory Visit | Attending: Family Medicine | Admitting: Family Medicine

## 2017-10-20 DIAGNOSIS — I714 Abdominal aortic aneurysm, without rupture, unspecified: Secondary | ICD-10-CM

## 2017-10-20 NOTE — Telephone Encounter (Signed)
-----   Message from Lyndal Pulley, DO sent at 10/20/2017 11:50 AM EDT ----- Call and tell her normal

## 2017-10-22 ENCOUNTER — Ambulatory Visit (INDEPENDENT_AMBULATORY_CARE_PROVIDER_SITE_OTHER): Payer: Medicare Other | Admitting: Pharmacist

## 2017-10-22 DIAGNOSIS — I480 Paroxysmal atrial fibrillation: Secondary | ICD-10-CM | POA: Diagnosis not present

## 2017-10-22 DIAGNOSIS — Z7901 Long term (current) use of anticoagulants: Secondary | ICD-10-CM | POA: Diagnosis not present

## 2017-10-22 LAB — POCT INR: INR: 1.3

## 2017-10-23 ENCOUNTER — Ambulatory Visit
Admission: RE | Admit: 2017-10-23 | Discharge: 2017-10-23 | Disposition: A | Discharge status: Home / Self Care | Payer: Medicare Other | Source: Ambulatory Visit | Attending: Family Medicine | Admitting: Family Medicine

## 2017-10-23 DIAGNOSIS — M5416 Radiculopathy, lumbar region: Secondary | ICD-10-CM

## 2017-10-23 MED ORDER — METHYLPREDNISOLONE ACETATE 40 MG/ML INJ SUSP (RADIOLOG
120.0000 mg | Freq: Once | INTRAMUSCULAR | Status: AC
  Administered 2017-10-23: 120 mg via EPIDURAL

## 2017-10-23 MED ORDER — IOPAMIDOL (ISOVUE-M 200) INJECTION 41%
1.0000 mL | Freq: Once | INTRAMUSCULAR | Status: AC
  Administered 2017-10-23: 1 mL via EPIDURAL

## 2017-10-23 NOTE — Discharge Instructions (Signed)

## 2017-11-14 ENCOUNTER — Ambulatory Visit: Payer: Medicare Other | Admitting: Family Medicine

## 2017-11-14 ENCOUNTER — Encounter: Payer: Self-pay | Admitting: Family Medicine

## 2017-11-14 ENCOUNTER — Other Ambulatory Visit (INDEPENDENT_AMBULATORY_CARE_PROVIDER_SITE_OTHER): Payer: Medicare Other

## 2017-11-14 VITALS — BP 160/90 | Temp 98.1°F | Ht 64.0 in | Wt 164.0 lb

## 2017-11-14 DIAGNOSIS — R591 Generalized enlarged lymph nodes: Secondary | ICD-10-CM | POA: Diagnosis not present

## 2017-11-14 DIAGNOSIS — R221 Localized swelling, mass and lump, neck: Secondary | ICD-10-CM

## 2017-11-14 LAB — COMPREHENSIVE METABOLIC PANEL
ALK PHOS: 64 U/L (ref 39–117)
ALT: 28 U/L (ref 0–35)
AST: 30 U/L (ref 0–37)
Albumin: 3.9 g/dL (ref 3.5–5.2)
BUN: 15 mg/dL (ref 6–23)
CO2: 28 mEq/L (ref 19–32)
Calcium: 9.7 mg/dL (ref 8.4–10.5)
Chloride: 101 mEq/L (ref 96–112)
Creatinine, Ser: 0.74 mg/dL (ref 0.40–1.20)
GFR: 80.25 mL/min (ref >60.00)
GLUCOSE: 81 mg/dL (ref 70–99)
POTASSIUM: 4.4 meq/L (ref 3.5–5.1)
SODIUM: 137 meq/L (ref 135–145)
TOTAL PROTEIN: 7.1 g/dL (ref 6.0–8.3)
Total Bilirubin: 1.1 mg/dL (ref 0.2–1.2)

## 2017-11-14 LAB — CBC WITH DIFFERENTIAL/PLATELET
BASOS PCT: 1.2 % (ref 0.0–3.0)
Basophils Absolute: 0.1 10*3/uL (ref 0.0–0.1)
EOS PCT: 2.8 % (ref 0.0–5.0)
Eosinophils Absolute: 0.2 10*3/uL (ref 0.0–0.7)
HCT: 40.2 % (ref 36.0–46.0)
Hemoglobin: 13.4 g/dL (ref 12.0–15.0)
LYMPHS ABS: 1.4 10*3/uL (ref 0.7–4.0)
Lymphocytes Relative: 19.4 % (ref 12.0–46.0)
MCHC: 33.4 g/dL (ref 30.0–36.0)
MCV: 103.7 fl — ABNORMAL HIGH (ref 78.0–100.0)
MONO ABS: 0.6 10*3/uL (ref 0.1–1.0)
Monocytes Relative: 7.9 % (ref 3.0–12.0)
NEUTROS PCT: 68.7 % (ref 43.0–77.0)
Neutro Abs: 4.9 10*3/uL (ref 1.4–7.7)
Platelets: 226 10*3/uL (ref 150.0–400.0)
RBC: 3.88 Mil/uL (ref 3.87–5.11)
RDW: 14.7 % (ref 11.5–15.5)
WBC: 7.2 10*3/uL (ref 4.0–10.5)

## 2017-11-14 NOTE — Progress Notes (Signed)
Patient ID: Colleen [name], female    DOB: [birth year]  Age: 82 y.o. MRN: [id number]    Subjective:  Subjective  HPI NIAMYA VITTITOW presents for c/o lymphadenopathy in neck.  No other symptoms  bp running high but she did not take her bp med Review of Systems  Constitutional: Negative for appetite change, diaphoresis, fatigue and unexpected weight change.  Eyes: Negative for pain, redness and visual disturbance.  Respiratory: Negative for cough, chest tightness, shortness of breath and wheezing.   Cardiovascular: Negative for chest pain, palpitations and leg swelling.  Endocrine: Negative for cold intolerance, heat intolerance, polydipsia, polyphagia and polyuria.  Genitourinary: Negative for difficulty urinating, dysuria and frequency.  Neurological: Negative for dizziness, light-headedness, numbness and headaches.  Hematological: Positive for adenopathy.    History Past Medical History:  Diagnosis Date  . Atrial fibrillation ([place])   . Carotid artery disease ([place])   . CHF (congestive heart failure) ([place])   . Hyperlipidemia   . Hypertension   . Hypothyroidism   . Shortness of breath   . TIA (transient ischemic attack) 2001    She has a past surgical history that includes Cholecystectomy; Abdominal hysterectomy (1975); Carotid endarterectomy (2001); doppler echocardiography (02/13/2010); doppler echocardiography (09/29/2007); NM MYOVIEW LTD (02/13/2010); carotid duplex (11/24/2012); carotid duplex (11/01/2010); renal duplex (02/13/2010); Video bronchoscopy (Bilateral, 08/09/2015); and Cardiac catheterization (N/A, 08/14/2015).   Her family history includes Arthritis in her mother and sister; Colon cancer in her brother; Heart failure in her mother; Parkinson's disease in her mother.She reports that she quit smoking about 17 years ago. Her smoking use included cigarettes. She has a 47.00 pack-year smoking history. she has never used smokeless tobacco. She reports that she drinks alcohol.  She reports that she does not use drugs.  Current Outpatient Medications on File Prior to Visit  Medication Sig Dispense Refill  . cloNIDine (CATAPRES) 0.1 MG tablet Take 0.1 mg by mouth as directed.    . flecainide (TAMBOCOR) 100 MG tablet TAKE ONE TABLET BY MOUTH TWICE DAILY 30 tablet 9  . furosemide (LASIX) 40 MG tablet Take 1 tablet (40 mg total) by mouth daily. Will take extra 40mg  PRN for swelling 30 tablet   . levothyroxine (SYNTHROID, LEVOTHROID) 100 MCG tablet Take 100 mcg by mouth daily before breakfast.     . lisinopril (PRINIVIL,ZESTRIL) 20 MG tablet Take 1 tablet (20 mg total) by mouth 2 (two) times daily. 60 tablet 6  . niacin 500 MG tablet Take 500 mg by mouth every evening. otc    . tizanidine (ZANAFLEX) 2 MG capsule Take 1 capsule (2 mg total) by mouth 3 (three) times daily as needed for muscle spasms. 90 capsule 0  . traMADol (ULTRAM) 50 MG tablet Take 1 tablet (50 mg total) by mouth every 8 (eight) hours as needed. 30 tablet 0  . warfarin (COUMADIN) 5 MG tablet TAKE 1 TABLET BY MOUTH ONCE DAILY OR AS DIRECTED BY [facility] 30 tablet 1   No current facility-administered medications on file prior to visit.      Objective:  Objective  Physical Exam  Lymphadenopathy:       Head (right side): Submandibular adenopathy present.    She has cervical adenopathy.       Right cervical: Superficial cervical and posterior cervical adenopathy present.    She has no axillary adenopathy.  Nursing note and vitals reviewed.  BP (!) 160/90   Temp 98.1 F (36.7 C) (Oral)   Ht 5\' 4"  (1.626 m)  Wt 164 lb (74.4 kg)   BMI 28.15 kg/m  Wt Readings from Last 3 Encounters:  11/14/17 164 lb (74.4 kg)  10/01/17 164 lb (74.4 kg)  04/09/17 165 lb (74.8 kg)     Lab Results  Component Value Date   WBC 7.2 11/14/2017   HGB 13.4 11/14/2017   HCT 40.2 11/14/2017   PLT 226.0 11/14/2017   GLUCOSE 81 11/14/2017   CHOL 148 02/21/2015   TRIG 90 02/21/2015   HDL 47 02/21/2015    LDLCALC 83 02/21/2015   ALT 28 11/14/2017   AST 30 11/14/2017   NA 137 11/14/2017   K 4.4 11/14/2017   CL 101 11/14/2017   CREATININE 0.74 11/14/2017   BUN 15 11/14/2017   CO2 28 11/14/2017   TSH 1.955 10/05/2015   INR 1.3 10/22/2017    Dg Inject Diag/thera/inc Needle/cath/plc Epi/lumb/sac W/img  Result Date: 10/23/2017 CLINICAL DATA:  Right back and hip pain. FLUOROSCOPY TIME:  Radiation Exposure Index (as provided by the fluoroscopic device): 34.67 Gy*m2 Fluoroscopy Time:  25 seconds Number of Acquired Images:  0 PROCEDURE: The procedure, risks, benefits, and alternatives were explained to the patient. Questions regarding the procedure were encouraged and answered. The patient understands and consents to the procedure. LUMBAR EPIDURAL INJECTION: An interlaminar approach was performed on right at L5-S1. The overlying skin was cleansed and anesthetized. A 3.5 inch 20 gauge epidural needle was advanced using loss-of-resistance technique. DIAGNOSTIC EPIDURAL INJECTION: Injection of Isovue-M 200 shows a good epidural pattern with spread above and below the level of needle placement, primarily on the right. No vascular opacification is seen. THERAPEUTIC EPIDURAL INJECTION: 120 mg of Depo-Medrol mixed with 2 ml of 1% lidocaine were instilled. The procedure was well-tolerated, and the patient was discharged thirty minutes following the injection in good condition. COMPLICATIONS: None immediate. IMPRESSION: Technically successful epidural injection on the right at L5-S1 #1. Electronically Signed   By: Titus Dubin M.D.   On: 10/23/2017 10:28     Assessment & Plan:  Plan  I am having Donnel Saxon maintain her levothyroxine, niacin, furosemide, cloNIDine, lisinopril, flecainide, tizanidine, warfarin, and traMADol.  No orders of the defined types were placed in this encounter.   Problem List Items Addressed This Visit    None    Visit Diagnoses    Lymphadenopathy    -  Primary   Relevant  Orders   CBC with Differential/Platelet (Completed)   Comprehensive metabolic panel (Completed)   Localized swelling, mass or lump of neck       Relevant Orders   CT Soft Tissue Neck W Contrast      Follow-up: Return if symptoms worsen or fail to improve.  [place], DO    [phone]

## 2017-11-14 NOTE — Patient Instructions (Signed)
Lymphadenopathy Lymphadenopathy refers to swollen or enlarged lymph glands, also called lymph nodes. Lymph glands are part of your body's defense (immune) system, which protects the body from infections, germs, and diseases. Lymph glands are found in many locations in your body, including the neck, underarm, and groin. Many things can cause lymph glands to become enlarged. When your immune system responds to germs, such as viruses or bacteria, infection-fighting cells and fluid build up. This causes the glands to grow in size. Usually, this is not something to worry about. The swelling and any soreness often go away without treatment. However, swollen lymph glands can also be caused by a number of diseases. Your health care provider may do various tests to help determine the cause. If the cause of your swollen lymph glands cannot be found, it is important to monitor your condition to make sure the swelling goes away. Follow these instructions at home: Watch your condition for any changes. The following actions may help to lessen any discomfort you are feeling:  Get plenty of rest.  Take medicines only as directed by your health care provider. Your health care provider may recommend over-the-counter medicines for pain.  Apply moist heat compresses to the site of swollen lymph nodes as directed by your health care provider. This can help reduce any pain.  Check your lymph nodes daily for any changes.  Keep all follow-up visits as directed by your health care provider. This is important.  Contact a health care provider if:  Your lymph nodes are still swollen after 2 weeks.  Your swelling increases or spreads to other areas.  Your lymph nodes are hard, seem fixed to the skin, or are growing rapidly.  Your skin over the lymph nodes is red and inflamed.  You have a fever.  You have chills.  You have fatigue.  You develop a sore throat.  You have abdominal pain.  You have weight  loss.  You have night sweats. Get help right away if:  You notice fluid leaking from the area of the enlarged lymph node.  You have severe pain in any area of your body.  You have chest pain.  You have shortness of breath. This information is not intended to replace advice given to you by your health care provider. Make sure you discuss any questions you have with your health care provider. Document Released: 09/17/2008 Document Revised: 05/16/2016 Document Reviewed: 07/14/2014 Elsevier Interactive Patient Education  2018 Elsevier Inc.  

## 2017-11-24 ENCOUNTER — Ambulatory Visit (INDEPENDENT_AMBULATORY_CARE_PROVIDER_SITE_OTHER): Payer: Medicare Other | Admitting: Pharmacist Clinician (PhC)/ Clinical Pharmacy Specialist

## 2017-11-24 ENCOUNTER — Other Ambulatory Visit: Payer: Self-pay | Admitting: Endocrinology

## 2017-11-24 ENCOUNTER — Ambulatory Visit
Admission: RE | Admit: 2017-11-24 | Discharge: 2017-11-24 | Disposition: A | Discharge status: Home / Self Care | Payer: Medicare Other | Source: Ambulatory Visit | Attending: Endocrinology | Admitting: Endocrinology

## 2017-11-24 DIAGNOSIS — R221 Localized swelling, mass and lump, neck: Secondary | ICD-10-CM

## 2017-11-24 DIAGNOSIS — Z7901 Long term (current) use of anticoagulants: Secondary | ICD-10-CM

## 2017-11-24 DIAGNOSIS — R590 Localized enlarged lymph nodes: Secondary | ICD-10-CM

## 2017-11-24 DIAGNOSIS — I480 Paroxysmal atrial fibrillation: Secondary | ICD-10-CM

## 2017-11-24 LAB — POCT INR: INR: 2.9

## 2017-11-24 MED ORDER — IOPAMIDOL (ISOVUE-300) INJECTION 61%
40.0000 mL | Freq: Once | INTRAVENOUS | Status: AC | PRN
  Administered 2017-11-24: 40 mL via INTRAVENOUS

## 2017-11-25 ENCOUNTER — Other Ambulatory Visit (HOSPITAL_COMMUNITY): Payer: Self-pay | Admitting: Endocrinology

## 2017-11-25 DIAGNOSIS — R591 Generalized enlarged lymph nodes: Secondary | ICD-10-CM

## 2017-11-26 ENCOUNTER — Other Ambulatory Visit: Payer: Self-pay | Admitting: Radiology

## 2017-11-26 ENCOUNTER — Telehealth: Payer: Self-pay | Admitting: Pharmacist

## 2017-11-26 ENCOUNTER — Other Ambulatory Visit: Payer: Self-pay | Admitting: Student

## 2017-11-26 NOTE — Telephone Encounter (Signed)
Patient contacted coumadin clinic to request clearance to be faxed to Dr balan's office prior to biopsy on Dec/06/2017.    See attached letter sent Dr Almetta Lovely office @ [phone]

## 2017-11-27 ENCOUNTER — Other Ambulatory Visit: Payer: Self-pay | Admitting: Student

## 2017-11-28 ENCOUNTER — Ambulatory Visit (HOSPITAL_COMMUNITY)
Admission: RE | Admit: 2017-11-28 | Discharge: 2017-11-28 | Disposition: A | Discharge status: Home / Self Care | Payer: Medicare Other | Source: Ambulatory Visit | Attending: Endocrinology | Admitting: Endocrinology

## 2017-11-28 ENCOUNTER — Encounter (HOSPITAL_COMMUNITY): Payer: Self-pay

## 2017-11-28 DIAGNOSIS — I509 Heart failure, unspecified: Secondary | ICD-10-CM | POA: Insufficient documentation

## 2017-11-28 DIAGNOSIS — Z8249 Family history of ischemic heart disease and other diseases of the circulatory system: Secondary | ICD-10-CM | POA: Insufficient documentation

## 2017-11-28 DIAGNOSIS — Z9049 Acquired absence of other specified parts of digestive tract: Secondary | ICD-10-CM | POA: Insufficient documentation

## 2017-11-28 DIAGNOSIS — E039 Hypothyroidism, unspecified: Secondary | ICD-10-CM

## 2017-11-28 DIAGNOSIS — Z90722 Acquired absence of ovaries, bilateral: Secondary | ICD-10-CM | POA: Insufficient documentation

## 2017-11-28 DIAGNOSIS — Z8673 Personal history of transient ischemic attack (TIA), and cerebral infarction without residual deficits: Secondary | ICD-10-CM

## 2017-11-28 DIAGNOSIS — Z9889 Other specified postprocedural states: Secondary | ICD-10-CM

## 2017-11-28 DIAGNOSIS — Z79899 Other long term (current) drug therapy: Secondary | ICD-10-CM

## 2017-11-28 DIAGNOSIS — I110 Hypertensive heart disease with heart failure: Secondary | ICD-10-CM

## 2017-11-28 DIAGNOSIS — E785 Hyperlipidemia, unspecified: Secondary | ICD-10-CM | POA: Insufficient documentation

## 2017-11-28 DIAGNOSIS — Z90710 Acquired absence of both cervix and uterus: Secondary | ICD-10-CM | POA: Insufficient documentation

## 2017-11-28 DIAGNOSIS — C8331 Diffuse large B-cell lymphoma, lymph nodes of head, face, and neck: Secondary | ICD-10-CM | POA: Insufficient documentation

## 2017-11-28 DIAGNOSIS — Z7902 Long term (current) use of antithrombotics/antiplatelets: Secondary | ICD-10-CM | POA: Insufficient documentation

## 2017-11-28 DIAGNOSIS — R591 Generalized enlarged lymph nodes: Secondary | ICD-10-CM

## 2017-11-28 DIAGNOSIS — Z87891 Personal history of nicotine dependence: Secondary | ICD-10-CM

## 2017-11-28 DIAGNOSIS — Z820 Family history of epilepsy and other diseases of the nervous system: Secondary | ICD-10-CM

## 2017-11-28 DIAGNOSIS — I4891 Unspecified atrial fibrillation: Secondary | ICD-10-CM

## 2017-11-28 DIAGNOSIS — I6529 Occlusion and stenosis of unspecified carotid artery: Secondary | ICD-10-CM | POA: Insufficient documentation

## 2017-11-28 DIAGNOSIS — Z800 Family history of malignant neoplasm of digestive organs: Secondary | ICD-10-CM | POA: Insufficient documentation

## 2017-11-28 DIAGNOSIS — Z8261 Family history of arthritis: Secondary | ICD-10-CM | POA: Insufficient documentation

## 2017-11-28 LAB — PROTIME-INR
INR: 1.91
PROTHROMBIN TIME: 21.7 s — AB (ref 11.4–15.2)

## 2017-11-28 LAB — CBC
HCT: 36.4 % (ref 36.0–46.0)
Hemoglobin: 12.1 g/dL (ref 12.0–15.0)
MCH: 33.8 pg (ref 26.0–34.0)
MCHC: 33.2 g/dL (ref 30.0–36.0)
MCV: 101.7 fL — ABNORMAL HIGH (ref 78.0–100.0)
PLATELETS: 258 10*3/uL (ref 150–400)
RBC: 3.58 MIL/uL — AB (ref 3.87–5.11)
RDW: 14.2 % (ref 11.5–15.5)
WBC: 5.4 10*3/uL (ref 4.0–10.5)

## 2017-11-28 LAB — APTT: aPTT: 38 seconds — ABNORMAL HIGH (ref 24–36)

## 2017-11-28 MED ORDER — LIDOCAINE HCL (PF) 1 % IJ SOLN
INTRAMUSCULAR | Status: AC
  Filled 2017-11-28: qty 10

## 2017-11-28 MED ORDER — FENTANYL CITRATE (PF) 100 MCG/2ML IJ SOLN
INTRAMUSCULAR | Status: AC
  Filled 2017-11-28: qty 2

## 2017-11-28 MED ORDER — MIDAZOLAM HCL 2 MG/2ML IJ SOLN
INTRAMUSCULAR | Status: AC | PRN
  Administered 2017-11-28 (×2): 1 mg via INTRAVENOUS

## 2017-11-28 MED ORDER — MIDAZOLAM HCL 2 MG/2ML IJ SOLN
INTRAMUSCULAR | Status: AC
  Filled 2017-11-28: qty 2

## 2017-11-28 MED ORDER — FENTANYL CITRATE (PF) 100 MCG/2ML IJ SOLN
INTRAMUSCULAR | Status: AC | PRN
  Administered 2017-11-28 (×2): 50 ug via INTRAVENOUS

## 2017-11-28 MED ORDER — SODIUM CHLORIDE 0.9 % IV SOLN: INTRAVENOUS | Status: DC

## 2017-11-28 NOTE — Discharge Instructions (Addendum)
Needle Biopsy, Care After °Refer to this sheet in the next few weeks. These instructions provide you with information about caring for yourself after your procedure. Your health care provider may also give you more specific instructions. Your treatment has been planned according to current medical practices, but problems sometimes occur. Call your health care provider if you have any problems or questions after your procedure. °What can I expect after the procedure? °After your procedure, it is common to have soreness, bruising, or mild pain at the biopsy site. This should go away in a few days. °Follow these instructions at home: °· Rest as directed by your health care provider. °· Take medicines only as directed by your health care provider. °· There are many different ways to close and cover the biopsy site, including stitches (sutures), skin glue, and adhesive strips. Follow your health care provider's instructions about: °? Biopsy site care. °? Bandage (dressing) changes and removal. °? Biopsy site closure removal. °· Check your biopsy site every day for signs of infection. Watch for: °? Redness, swelling, or pain. °? Fluid, blood, or pus. °Contact a health care provider if: °· You have a fever. °· You have redness, swelling, or pain at the biopsy site that lasts longer than a few days. °· You have fluid, blood, or pus coming from the biopsy site. °· You feel nauseous. °· You vomit. °Get help right away if: °· You have shortness of breath. °· You have trouble breathing. °· You have chest pain. °· You feel dizzy or you faint. °· You have bleeding that does not stop with pressure or a bandage. °· You cough up blood. °· You have pain in your abdomen. °This information is not intended to replace advice given to you by your health care provider. Make sure you discuss any questions you have with your health care provider. °Document Released: 04/25/2015 Document Revised: 05/16/2016 Document Reviewed:  12/05/2014 °Elsevier Interactive Patient Education © 2018 Elsevier Inc. °Moderate Conscious Sedation, Adult, Care After °These instructions provide you with information about caring for yourself after your procedure. Your health care provider may also give you more specific instructions. Your treatment has been planned according to current medical practices, but problems sometimes occur. Call your health care provider if you have any problems or questions after your procedure. °What can I expect after the procedure? °After your procedure, it is common: °· To feel sleepy for several hours. °· To feel clumsy and have poor balance for several hours. °· To have poor judgment for several hours. °· To vomit if you eat too soon. ° °Follow these instructions at home: °For at least 24 hours after the procedure: ° °· Do not: °? Participate in activities where you could fall or become injured. °? Drive. °? Use heavy machinery. °? Drink alcohol. °? Take sleeping pills or medicines that cause drowsiness. °? Make important decisions or sign legal documents. °? Take care of children on your own. °· Rest. °Eating and drinking °· Follow the diet recommended by your health care provider. °· If you vomit: °? Drink water, juice, or soup when you can drink without vomiting. °? Make sure you have little or no nausea before eating solid foods. °General instructions °· Have a responsible adult stay with you until you are awake and alert. °· Take over-the-counter and prescription medicines only as told by your health care provider. °· If you smoke, do not smoke without supervision. °· Keep all follow-up visits as told by your health care   provider. This is important. °Contact a health care provider if: °· You keep feeling nauseous or you keep vomiting. °· You feel light-headed. °· You develop a rash. °· You have a fever. °Get help right away if: °· You have trouble breathing. °This information is not intended to replace advice given to you  by your health care provider. Make sure you discuss any questions you have with your health care provider. °Document Released: 09/29/2013 Document Revised: 05/13/2016 Document Reviewed: 03/30/2016 °Elsevier Interactive Patient Education © 2018 Elsevier Inc. ° °

## 2017-11-28 NOTE — Sedation Documentation (Signed)
Patient is resting comfortably. 

## 2017-11-28 NOTE — H&P (Signed)
Chief Complaint: Lymphadenopathy  Referring Physician(s): [place]  Supervising Physician: Jacqulynn Cadet  Patient Status: [facility] - Out-pt  History of Present Illness: Colleen [name] is a 82 y.o. female with a large mass under her right mandible.  She states it appeared around Thanksgiving and that it continued to increase in size quite rapidly.  She complains of right neck and ear pain.  CT scan done 12/3 showed severe RIGHT and mild LEFT neck lymphadenopathy. Severe LEFT axillary lymphadenopathy with lymph edema versus lymphangitic spread of tumor. LEFT retropectoral infiltrative mass.   We are asked to perform an image guided biopsy.  She takes wafarin for Afib and has held this since Monday. Her INR is 1.9 and Dr. Laurence Ferrari is aware.  She is NPO.   Past Medical History:  Diagnosis Date  . Atrial fibrillation ([place])   . Carotid artery disease ([place])   . CHF (congestive heart failure) ([place])   . Hyperlipidemia   . Hypertension   . Hypothyroidism   . Shortness of breath   . TIA (transient ischemic attack) 2001    Past Surgical History:  Procedure Laterality Date  . ABDOMINAL HYSTERECTOMY  1975   TAH,BSO  . CARDIAC CATHETERIZATION N/A 08/14/2015   Procedure: Right Heart Cath;  Surgeon: Larey Dresser, MD;  Location: [facility];  Service: Cardiovascular;  Laterality: N/A;  . carotid duplex  11/24/2012  . carotid duplex  11/01/2010  . CAROTID ENDARTERECTOMY  2001   left  . CHOLECYSTECTOMY    . DOPPLER ECHOCARDIOGRAPHY  02/13/2010   EF>55%  . DOPPLER ECHOCARDIOGRAPHY  09/29/2007  . NM MYOVIEW LTD  02/13/2010  . renal duplex  02/13/2010  . VIDEO BRONCHOSCOPY Bilateral 08/09/2015   Procedure: VIDEO BRONCHOSCOPY WITHOUT FLUORO;  Surgeon: Collene Gobble, MD;  Location: [facility];  Service: Cardiopulmonary;  Laterality: Bilateral;    Allergies: Patient has no known allergies.  Medications: Prior to Admission medications   Medication Sig Start  Date End Date Taking? Authorizing Provider  flecainide (TAMBOCOR) 100 MG tablet Take 50 mg by mouth 2 (two) times daily.   Yes [provider]  furosemide (LASIX) 40 MG tablet Take 1 tablet (40 mg total) by mouth daily. Will take extra 40mg  PRN for swelling 04/19/16  Yes Larey Dresser, MD  levothyroxine (SYNTHROID, LEVOTHROID) 88 MCG tablet Take 88 mcg by mouth daily before breakfast.    Yes [provider]  lisinopril (PRINIVIL,ZESTRIL) 20 MG tablet Take 1 tablet (20 mg total) by mouth 2 (two) times daily. Patient taking differently: Take 20 mg by mouth daily.  04/09/17  Yes Lorretta Harp, MD  warfarin (COUMADIN) 5 MG tablet Take 2.5-5 mg by mouth See admin instructions. 5 mg on Mon and Fri.. 2.5 mg all other days   Yes [provider]     Family History  Problem Relation Age of Onset  . Heart failure Mother   . Parkinson's disease Mother   . Arthritis Mother   . Arthritis Sister   . Colon cancer Brother   . Rheumatologic disease Neg Hx   . Lung disease Neg Hx     Social History   Socioeconomic History  . Marital status: Married    Spouse name: None  . Number of children: 2  . Years of education: None  . Highest education level: None  Social Needs  . Financial resource strain: None  . Food insecurity - worry: None  . Food insecurity - inability: None  . Transportation  needs - medical: None  . Transportation needs - non-medical: None  Occupational History  . Occupation: retired  Tobacco Use  . Smoking status: Former Smoker    Packs/day: 1.00    Years: 47.00    Pack years: 47.00    Types: Cigarettes    Last attempt to quit: 12/24/1999    Years since quitting: 17.9  . Smokeless tobacco: Never Used  Substance and Sexual Activity  . Alcohol use: Yes    Alcohol/week: 0.0 oz    Comment: 1 glass of wine rarely  . Drug use: No  . Sexual activity: None  Other Topics Concern  . None  Social History Narrative   Originally she is from [place]. She  has previously lived in [place] & moved to [place] in 1985. 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 No mold or hot tub exposure. No bird exposure.    Review of Systems: A 12 point ROS discussed  Review of Systems  Constitutional: Negative.   HENT:       Large palpable mass under right mandible  Respiratory: Negative.   Cardiovascular: Negative.   Gastrointestinal: Negative.   Genitourinary: Negative.   Musculoskeletal: Negative.   Skin: Negative.   Neurological: Negative.   Hematological: Negative.   Psychiatric/Behavioral: Negative.     Vital Signs: BP (!) 125/55   Pulse 78   Temp 97.8 F (36.6 C) (Oral)   Resp 18   Ht 5\' 4"  (1.626 m)   Wt 161 lb (73 kg)   SpO2 93%   BMI 27.64 kg/m   Physical Exam  Constitutional: She is oriented to person, place, and time. She appears well-developed.  HENT:  Head: Normocephalic and atraumatic.    Large mass under right mandible  Eyes: EOM are normal.  Cardiovascular: Normal rate, regular rhythm and normal heart sounds.  No murmur heard. Pulmonary/Chest: Effort normal and breath sounds normal. No stridor. No respiratory distress.  Abdominal: Soft. She exhibits no distension.  Musculoskeletal: Normal range of motion.  Neurological: She is alert and oriented to person, place, and time.  Skin: Skin is warm and dry.  Psychiatric: She has a normal mood and affect. Her behavior is normal. Judgment and thought content normal.  Vitals reviewed.   Imaging: Ct Soft Tissue Neck W Contrast  Result Date: 11/24/2017 CLINICAL DATA:  RIGHT neck and ear pain, enlarging RIGHT supraclavicular and retroauricular mass for 2 weeks. LEFT facial swelling. History of carotid artery surgery. EXAM: CT NECK WITH CONTRAST TECHNIQUE: Multidetector CT imaging of the neck was performed using the standard protocol following the bolus administration of intravenous contrast. CONTRAST:  55mL ISOVUE-300 IOPAMIDOL (ISOVUE-300) INJECTION 61%  COMPARISON:  CT chest September 11, 2015 FINDINGS: PHARYNX AND LARYNX: Normal.  Widely patent airway. SALIVARY GLANDS: Normal. THYROID: Normal. LYMPH NODES: Multilevel severe RIGHT cervical lymphadenopathy. 27 mm RIGHT level 1 B lymph node, round RIGHT level IIa 23 mm lymph node, 3.1 cm RIGHT supraclavicular lymph node with pericapsular fat stranding RIGHT neck lymphadenopathy. Mild LEFT neck lymphadenopathy including 11 mm intraparotid lymph nodes and 14 mm LEFT level IIa lymph node. VASCULAR: Normal. LIMITED INTRACRANIAL: Normal. VISUALIZED ORBITS: Surgical clips LEFT neck most compatible with endarterectomy. Severe calcific atherosclerosis RIGHT carotid bifurcation. Lymphadenopathy narrows RIGHT internal jugular vein which remains patent. MASTOIDS AND VISUALIZED PARANASAL SINUSES: Well-aerated. SKELETON: Nonacute. No destructive bony lesions. Patient is edentulous. UPPER CHEST: RIGHT apical bullous changes and scarring. Partially imaged bilateral hilar lymphadenopathy. Mediastinal lymphadenopathy including 2 cm aortopulmonary window lymph  node, 17 mm pretracheal lymph node. LEFT greater than RIGHT axillary lymphadenopathy, at least 4 cm nodal conglomeration with LEFT retropectoral infiltrative mass. Heart size is probably enlarged. Severe coronary artery calcifications. OTHER: IMPRESSION: 1. Severe RIGHT and mild LEFT neck lymphadenopathy. Severe LEFT axillary lymphadenopathy with lymph edema versus lymphangitic spread of tumor. LEFT retropectoral infiltrative mass. Mediastinal lymphadenopathy. Constellation of findings seen with lymphoproliferative disease, metastatic disease such as breast cancer given LEFT chest findings. Recommend CT chest with contrast versus PET-CT. 2. These results will be called to the ordering clinician or representative by the Radiologist Assistant, and communication documented in the PACS or zVision Dashboard. Aortic Atherosclerosis (ICD10-I70.0). Electronically Signed   By: Elon Alas M.D.   On: 11/24/2017 20:19    Labs:  CBC: Recent Labs    11/14/17 1243 11/28/17 0556  WBC 7.2 5.4  HGB 13.4 12.1  HCT 40.2 36.4  PLT 226.0 258    COAGS: Recent Labs    09/22/17 1134 10/22/17 0837 11/24/17 1101 11/28/17 0556  INR 2.2 1.3 2.9 1.91  APTT  --   --   --  38*    BMP: Recent Labs    11/14/17 1243  NA 137  K 4.4  CL 101  CO2 28  GLUCOSE 81  BUN 15  CALCIUM 9.7  CREATININE 0.74    LIVER FUNCTION TESTS: Recent Labs    11/14/17 1243  BILITOT 1.1  AST 30  ALT 28  ALKPHOS 64  PROT 7.1  ALBUMIN 3.9    TUMOR MARKERS: No results for input(s): AFPTM, CEA, CA199, CHROMGRNA in the last 8760 hours.  Assessment and Plan:  Lymphadenopathy = severe on the right.  Will proceed with biopsy of right submandibular mass.  Risks and benefits discussed with the patient including, but not limited to bleeding, infection, damage to adjacent structures or low yield requiring additional tests.  All of the patient's questions were answered, patient is agreeable to proceed. Consent signed and in chart.  Thank you for this interesting consult.  I greatly enjoyed meeting Colleen [name] and look forward to participating in their care.  A copy of this report was sent to the requesting provider on this date.  Electronically Signed: Murrell Redden, PA-C 11/28/2017, 7:48 AM   I spent a total of  30 Minutes  in face to face in clinical consultation, greater than 50% of which was counseling/coordinating care for submandibular lymph node biopsy.

## 2017-11-29 ENCOUNTER — Encounter (HOSPITAL_COMMUNITY): Payer: Self-pay | Admitting: Emergency Medicine

## 2017-11-29 ENCOUNTER — Emergency Department (HOSPITAL_COMMUNITY): Payer: Medicare Other

## 2017-11-29 ENCOUNTER — Inpatient Hospital Stay (HOSPITAL_COMMUNITY)
Admission: EM | Admit: 2017-11-29 | Discharge: 2017-12-09 | DRG: 823 | Disposition: A | Discharge status: Rehabilitation Facility | Payer: Medicare Other | Attending: Family Medicine | Admitting: Family Medicine

## 2017-11-29 ENCOUNTER — Other Ambulatory Visit: Payer: Self-pay

## 2017-11-29 DIAGNOSIS — E872 Acidosis, unspecified: Secondary | ICD-10-CM

## 2017-11-29 DIAGNOSIS — E039 Hypothyroidism, unspecified: Secondary | ICD-10-CM | POA: Diagnosis present

## 2017-11-29 DIAGNOSIS — J439 Emphysema, unspecified: Secondary | ICD-10-CM | POA: Diagnosis present

## 2017-11-29 DIAGNOSIS — D62 Acute posthemorrhagic anemia: Secondary | ICD-10-CM

## 2017-11-29 DIAGNOSIS — R7989 Other specified abnormal findings of blood chemistry: Secondary | ICD-10-CM | POA: Diagnosis not present

## 2017-11-29 DIAGNOSIS — M5410 Radiculopathy, site unspecified: Secondary | ICD-10-CM

## 2017-11-29 DIAGNOSIS — Z7901 Long term (current) use of anticoagulants: Secondary | ICD-10-CM

## 2017-11-29 DIAGNOSIS — E860 Dehydration: Secondary | ICD-10-CM | POA: Diagnosis present

## 2017-11-29 DIAGNOSIS — I480 Paroxysmal atrial fibrillation: Secondary | ICD-10-CM

## 2017-11-29 DIAGNOSIS — Z79890 Hormone replacement therapy (postmenopausal): Secondary | ICD-10-CM

## 2017-11-29 DIAGNOSIS — Z9049 Acquired absence of other specified parts of digestive tract: Secondary | ICD-10-CM

## 2017-11-29 DIAGNOSIS — R52 Pain, unspecified: Secondary | ICD-10-CM

## 2017-11-29 DIAGNOSIS — E785 Hyperlipidemia, unspecified: Secondary | ICD-10-CM | POA: Diagnosis present

## 2017-11-29 DIAGNOSIS — C8591 Non-Hodgkin lymphoma, unspecified, lymph nodes of head, face, and neck: Secondary | ICD-10-CM

## 2017-11-29 DIAGNOSIS — Z90710 Acquired absence of both cervix and uterus: Secondary | ICD-10-CM

## 2017-11-29 DIAGNOSIS — Z8673 Personal history of transient ischemic attack (TIA), and cerebral infarction without residual deficits: Secondary | ICD-10-CM

## 2017-11-29 DIAGNOSIS — I10 Essential (primary) hypertension: Secondary | ICD-10-CM

## 2017-11-29 DIAGNOSIS — M7989 Other specified soft tissue disorders: Secondary | ICD-10-CM | POA: Diagnosis present

## 2017-11-29 DIAGNOSIS — M109 Gout, unspecified: Secondary | ICD-10-CM | POA: Diagnosis present

## 2017-11-29 DIAGNOSIS — I482 Chronic atrial fibrillation: Secondary | ICD-10-CM | POA: Diagnosis present

## 2017-11-29 DIAGNOSIS — Z820 Family history of epilepsy and other diseases of the nervous system: Secondary | ICD-10-CM

## 2017-11-29 DIAGNOSIS — R0902 Hypoxemia: Secondary | ICD-10-CM | POA: Diagnosis present

## 2017-11-29 DIAGNOSIS — I5032 Chronic diastolic (congestive) heart failure: Secondary | ICD-10-CM

## 2017-11-29 DIAGNOSIS — N3000 Acute cystitis without hematuria: Secondary | ICD-10-CM

## 2017-11-29 DIAGNOSIS — Z8249 Family history of ischemic heart disease and other diseases of the circulatory system: Secondary | ICD-10-CM

## 2017-11-29 DIAGNOSIS — I2510 Atherosclerotic heart disease of native coronary artery without angina pectoris: Secondary | ICD-10-CM | POA: Diagnosis present

## 2017-11-29 DIAGNOSIS — Z800 Family history of malignant neoplasm of digestive organs: Secondary | ICD-10-CM

## 2017-11-29 DIAGNOSIS — C8331 Diffuse large B-cell lymphoma, lymph nodes of head, face, and neck: Principal | ICD-10-CM

## 2017-11-29 DIAGNOSIS — J988 Other specified respiratory disorders: Secondary | ICD-10-CM

## 2017-11-29 DIAGNOSIS — N289 Disorder of kidney and ureter, unspecified: Secondary | ICD-10-CM | POA: Diagnosis present

## 2017-11-29 DIAGNOSIS — R000 Tachycardia, unspecified: Secondary | ICD-10-CM | POA: Diagnosis present

## 2017-11-29 DIAGNOSIS — R591 Generalized enlarged lymph nodes: Secondary | ICD-10-CM

## 2017-11-29 DIAGNOSIS — R791 Abnormal coagulation profile: Secondary | ICD-10-CM | POA: Diagnosis present

## 2017-11-29 DIAGNOSIS — Z79899 Other long term (current) drug therapy: Secondary | ICD-10-CM

## 2017-11-29 DIAGNOSIS — Z87891 Personal history of nicotine dependence: Secondary | ICD-10-CM

## 2017-11-29 DIAGNOSIS — K5900 Constipation, unspecified: Secondary | ICD-10-CM | POA: Diagnosis present

## 2017-11-29 DIAGNOSIS — R5381 Other malaise: Secondary | ICD-10-CM | POA: Diagnosis present

## 2017-11-29 DIAGNOSIS — J9601 Acute respiratory failure with hypoxia: Secondary | ICD-10-CM | POA: Diagnosis present

## 2017-11-29 DIAGNOSIS — M79601 Pain in right arm: Secondary | ICD-10-CM

## 2017-11-29 DIAGNOSIS — G540 Brachial plexus disorders: Secondary | ICD-10-CM

## 2017-11-29 DIAGNOSIS — Z8261 Family history of arthritis: Secondary | ICD-10-CM

## 2017-11-29 DIAGNOSIS — I110 Hypertensive heart disease with heart failure: Secondary | ICD-10-CM | POA: Diagnosis present

## 2017-11-29 LAB — CBC WITH DIFFERENTIAL/PLATELET
BASOS ABS: 0 10*3/uL (ref 0.0–0.1)
BASOS PCT: 0 %
Eosinophils Absolute: 0 10*3/uL (ref 0.0–0.7)
Eosinophils Relative: 0 %
HEMATOCRIT: 38.3 % (ref 36.0–46.0)
HEMOGLOBIN: 12.8 g/dL (ref 12.0–15.0)
LYMPHS PCT: 11 %
Lymphs Abs: 0.9 10*3/uL (ref 0.7–4.0)
MCH: 34.4 pg — ABNORMAL HIGH (ref 26.0–34.0)
MCHC: 33.4 g/dL (ref 30.0–36.0)
MCV: 103 fL — AB (ref 78.0–100.0)
MONO ABS: 0.7 10*3/uL (ref 0.1–1.0)
Monocytes Relative: 9 %
NEUTROS ABS: 6.1 10*3/uL (ref 1.7–7.7)
NEUTROS PCT: 80 %
Platelets: 291 10*3/uL (ref 150–400)
RBC: 3.72 MIL/uL — AB (ref 3.87–5.11)
RDW: 14.7 % (ref 11.5–15.5)
WBC: 7.7 10*3/uL (ref 4.0–10.5)

## 2017-11-29 LAB — COMPREHENSIVE METABOLIC PANEL
ALBUMIN: 3.4 g/dL — AB (ref 3.5–5.0)
ALT: 22 U/L (ref 14–54)
ANION GAP: 15 (ref 5–15)
AST: 53 U/L — ABNORMAL HIGH (ref 15–41)
Alkaline Phosphatase: 74 U/L (ref 38–126)
BILIRUBIN TOTAL: 0.6 mg/dL (ref 0.3–1.2)
BUN: 14 mg/dL (ref 6–20)
CALCIUM: 10.8 mg/dL — AB (ref 8.9–10.3)
CO2: 20 mmol/L — ABNORMAL LOW (ref 22–32)
Chloride: 98 mmol/L — ABNORMAL LOW (ref 101–111)
Creatinine, Ser: 1.06 mg/dL — ABNORMAL HIGH (ref 0.44–1.00)
GFR calc non Af Amer: 48 mL/min — ABNORMAL LOW (ref >60)
GFR, EST AFRICAN AMERICAN: 56 mL/min — AB (ref >60)
GLUCOSE: 101 mg/dL — AB (ref 65–99)
POTASSIUM: 4.3 mmol/L (ref 3.5–5.1)
SODIUM: 133 mmol/L — AB (ref 135–145)
TOTAL PROTEIN: 6.4 g/dL — AB (ref 6.5–8.1)

## 2017-11-29 LAB — PROTIME-INR
INR: 1.84
PROTHROMBIN TIME: 21.1 s — AB (ref 11.4–15.2)

## 2017-11-29 LAB — URINALYSIS, ROUTINE W REFLEX MICROSCOPIC
BILIRUBIN URINE: NEGATIVE
Glucose, UA: NEGATIVE mg/dL
KETONES UR: NEGATIVE mg/dL
NITRITE: POSITIVE — AB
PROTEIN: NEGATIVE mg/dL
SPECIFIC GRAVITY, URINE: 1.04 — AB (ref 1.005–1.030)
pH: 5 (ref 5.0–8.0)

## 2017-11-29 LAB — I-STAT CG4 LACTIC ACID, ED
LACTIC ACID, VENOUS: 7.91 mmol/L — AB (ref 0.5–1.9)
Lactic Acid, Venous: 6.99 mmol/L (ref 0.5–1.9)

## 2017-11-29 MED ORDER — IOPAMIDOL (ISOVUE-300) INJECTION 61%
INTRAVENOUS | Status: AC
  Administered 2017-11-29: 75 mL
  Filled 2017-11-29: qty 75

## 2017-11-29 MED ORDER — MORPHINE SULFATE (PF) 4 MG/ML IV SOLN
4.0000 mg | Freq: Once | INTRAVENOUS | Status: AC
  Administered 2017-11-29: 4 mg via INTRAVENOUS
  Filled 2017-11-29: qty 1

## 2017-11-29 MED ORDER — DEXTROSE 5 % IV SOLN
1.0000 g | Freq: Once | INTRAVENOUS | Status: AC
  Administered 2017-11-29: 1 g via INTRAVENOUS
  Filled 2017-11-29: qty 10

## 2017-11-29 MED ORDER — KETOROLAC TROMETHAMINE 15 MG/ML IJ SOLN
15.0000 mg | Freq: Once | INTRAMUSCULAR | Status: AC
  Administered 2017-11-29: 15 mg via INTRAVENOUS
  Filled 2017-11-29: qty 1

## 2017-11-29 MED ORDER — METHYLPREDNISOLONE SODIUM SUCC 125 MG IJ SOLR
125.0000 mg | Freq: Once | INTRAMUSCULAR | Status: AC
  Administered 2017-11-29: 125 mg via INTRAVENOUS
  Filled 2017-11-29: qty 2

## 2017-11-29 MED ORDER — LACTATED RINGERS IV BOLUS (SEPSIS)
1000.0000 mL | Freq: Once | INTRAVENOUS | Status: AC
  Administered 2017-11-29: 1000 mL via INTRAVENOUS

## 2017-11-29 MED ORDER — ARFORMOTEROL TARTRATE 15 MCG/2ML IN NEBU
15.0000 ug | INHALATION_SOLUTION | Freq: Two times a day (BID) | RESPIRATORY_TRACT | Status: DC
  Administered 2017-11-30 – 2017-12-03 (×7): 15 ug via RESPIRATORY_TRACT
  Filled 2017-11-29 (×11): qty 2

## 2017-11-29 MED ORDER — IPRATROPIUM-ALBUTEROL 0.5-2.5 (3) MG/3ML IN SOLN
3.0000 mL | Freq: Four times a day (QID) | RESPIRATORY_TRACT | Status: DC
  Administered 2017-11-30 – 2017-12-01 (×4): 3 mL via RESPIRATORY_TRACT
  Filled 2017-11-29 (×5): qty 3

## 2017-11-29 MED ORDER — IOPAMIDOL (ISOVUE-300) INJECTION 61%
INTRAVENOUS | Status: AC
  Filled 2017-11-29: qty 75

## 2017-11-29 MED ORDER — SODIUM CHLORIDE 0.9 % IV SOLN
INTRAVENOUS | Status: DC
  Administered 2017-11-29 – 2017-12-06 (×8): via INTRAVENOUS

## 2017-11-29 MED ORDER — BUDESONIDE 0.25 MG/2ML IN SUSP
0.2500 mg | Freq: Two times a day (BID) | RESPIRATORY_TRACT | Status: DC
  Administered 2017-11-30 – 2017-12-03 (×7): 0.25 mg via RESPIRATORY_TRACT
  Filled 2017-11-29 (×11): qty 2

## 2017-11-29 NOTE — ED Provider Notes (Signed)
[facility] EMERGENCY DEPARTMENT Provider Note   CSN: [id number] Arrival date & time: 11/29/17  1741     History   Chief Complaint Chief Complaint  Patient presents with  . left arm pain  . Facial Swelling    HPI Colleen [name] is a 82 y.o. female.  HPI 82 year old female with a history of A. fib, CAD, CHF, HLD, HTN presenting with right arm pain started this morning when she woke up.  She states that since Thanksgiving she has had rapidly progressive worsening lymphadenopathy in both sides of her neck and around her clavicles.  She has been followed by her primary doctor for this and had a biopsy of 1 of the right clavicular lymph nodes yesterday.  She states she had mild pain in her right arm yesterday when she went to bed but when she woke up and got out of bed she had some sudden onset severe pain to her right upper anterior arm around her bicep she states it is very sharp.  It is to the point where she cannot lift her arm without significant pain.  Denies numbness or weakness.  Denies chest pain or shortness of breath or pleuritic pain.  Denies neck pain, difficulty breathing or swallowing.  She states the lymphadenopathy in her neck has worsened in the past few days.  Denies any recent fevers.  She was off of her warfarin for 4 days for the biopsy and took her first dose last night.  Denies leg pain, swelling.  No alleviating factors.  Any palpation or movement makes the pain significantly worse.  Past Medical History:  Diagnosis Date  . Atrial fibrillation ([place])   . Carotid artery disease ([place])   . CHF (congestive heart failure) ([place])   . Hyperlipidemia   . Hypertension   . Hypothyroidism   . Shortness of breath   . TIA (transient ischemic attack) 2001    Patient Active Problem List   Diagnosis Date Noted  . Degenerative arthritis of left knee 08/14/2016  . Arthritis of left foot 08/14/2016  . Peripheral vascular disease ([place]) 08/14/2016  . Emphysema  of lung ([place]) 10/19/2015  . Restrictive lung disease 10/19/2015  . Chronic respiratory failure ([place]) 09/04/2015  . Chronic diastolic CHF (congestive heart failure) ([place]) 08/25/2015  . Paroxysmal atrial fibrillation ([place]) 08/24/2015  . Pulmonary infiltrate   . Multiple lung nodules on CT 08/07/2015  . Acute kidney injury (nontraumatic) ([place])   . Acute diastolic heart failure ([place]) 07/30/2015  . Acute diastolic CHF (congestive heart failure) ([place]) 07/30/2015  . Acute respiratory failure with hypoxia ([place]) 07/30/2015  . AKI (acute kidney injury) ([place]) 07/30/2015  . Sinus bradycardia 07/30/2015  . Hyponatremia 07/30/2015  . Junctional bradycardia 07/28/2015  . Shortness of breath 07/28/2015  . Chest pain 02/15/2015  . Carotid artery disease ([place]) 07/15/2013  . Radiculopathy 06/19/2011  . LEG PAIN, LEFT 06/21/2010  . PAIN IN THORACIC SPINE 05/11/2010  . OSTEOPENIA 02/23/2009  . Acute low back pain 11/17/2007  . Hyperlipidemia 07/03/2007  . Essential hypertension 07/03/2007  . Hypothyroidism 06/10/2007    Past Surgical History:  Procedure Laterality Date  . ABDOMINAL HYSTERECTOMY  1975   TAH,BSO  . CARDIAC CATHETERIZATION N/A 08/14/2015   Procedure: Right Heart Cath;  Surgeon: Larey Dresser, MD;  Location: [facility];  Service: Cardiovascular;  Laterality: N/A;  . carotid duplex  11/24/2012  . carotid duplex  11/01/2010  . CAROTID ENDARTERECTOMY  2001   left  .  CHOLECYSTECTOMY    . DOPPLER ECHOCARDIOGRAPHY  02/13/2010   EF>55%  . DOPPLER ECHOCARDIOGRAPHY  09/29/2007  . NM MYOVIEW LTD  02/13/2010  . renal duplex  02/13/2010  . VIDEO BRONCHOSCOPY Bilateral 08/09/2015   Procedure: VIDEO BRONCHOSCOPY WITHOUT FLUORO;  Surgeon: Collene Gobble, MD;  Location: [facility];  Service: Cardiopulmonary;  Laterality: Bilateral;    OB History    No data available       Home Medications    Prior to Admission medications   Medication Sig Start Date End Date Taking?  Authorizing Provider  acetaminophen (TYLENOL) 500 MG tablet Take 1,000 mg by mouth every 6 (six) hours as needed for headache (pain).   Yes [provider]  flecainide (TAMBOCOR) 100 MG tablet Take 50 mg by mouth 2 (two) times daily.   Yes [provider]  furosemide (LASIX) 40 MG tablet Take 1 tablet (40 mg total) by mouth daily. Will take extra 40mg  PRN for swelling Patient taking differently: Take 40 mg by mouth See admin instructions. Take 1 tablet (40 mg) by mouth daily, may also take extra tablet (40mg ) as needed for ankle/ leg swelling 04/19/16  Yes Larey Dresser, MD  levothyroxine (SYNTHROID, LEVOTHROID) 88 MCG tablet Take 88 mcg by mouth daily before breakfast.    Yes [provider]  lisinopril (PRINIVIL,ZESTRIL) 20 MG tablet Take 1 tablet (20 mg total) by mouth 2 (two) times daily. Patient taking differently: Take 20 mg by mouth daily.  04/09/17  Yes Lorretta Harp, MD  warfarin (COUMADIN) 5 MG tablet Take 2.5-5 mg by mouth See admin instructions. Take 1 tablet (5 mg) by mouth on Monday and Friday after supper, take 1/2 tablet (2.5 mg) on Sunday, Tuesday, Wednesday, Thursday, Saturday with supper   Yes [provider]    Family History Family History  Problem Relation Age of Onset  . Heart failure Mother   . Parkinson's disease Mother   . Arthritis Mother   . Arthritis Sister   . Colon cancer Brother   . Rheumatologic disease Neg Hx   . Lung disease Neg Hx     Social History Social History   Tobacco Use  . Smoking status: Former Smoker    Packs/day: 1.00    Years: 47.00    Pack years: 47.00    Types: Cigarettes    Last attempt to quit: 12/24/1999    Years since quitting: 17.9  . Smokeless tobacco: Never Used  Substance Use Topics  . Alcohol use: Yes    Alcohol/week: 0.0 oz    Comment: 1 glass of wine rarely  . Drug use: No     Allergies   Patient has no known allergies.   Review of Systems Review of Systems    Constitutional: Negative for chills and fever.  HENT: Negative for ear pain and sore throat.   Eyes: Negative for pain and visual disturbance.  Respiratory: Negative for cough and shortness of breath.   Cardiovascular: Negative for chest pain and palpitations.  Gastrointestinal: Negative for abdominal pain and vomiting.  Genitourinary: Negative for dysuria and hematuria.  Musculoskeletal: Positive for arthralgias and myalgias. Negative for back pain.  Skin: Negative for color change and rash.  Neurological: Negative for seizures and syncope.  All other systems reviewed and are negative.    Physical Exam Updated Vital Signs BP 140/64   Pulse 86   Temp 97.8 F (36.6 C) (Oral)   Resp 12   Ht 5\' 4"  (1.626 m)  Wt 74.4 kg (164 lb) Comment: states on monday she weighed 158#  SpO2 95%   BMI 28.15 kg/m   Physical Exam  Constitutional: She is oriented to person, place, and time. She appears well-developed and well-nourished. No distress.  HENT:  Head: Normocephalic and atraumatic.  Mouth/Throat: Uvula is midline and oropharynx is clear and moist.  Eyes: Conjunctivae are normal.  Neck: Neck supple. No tracheal tenderness and no spinous process tenderness present. Decreased range of motion present.  Cardiovascular: Normal rate, normal heart sounds, intact distal pulses and normal pulses. An irregularly irregular rhythm present.  No murmur heard. Pulses:      Radial pulses are 2+ on the right side, and 2+ on the left side.  Pulmonary/Chest: Effort normal and breath sounds normal. No accessory muscle usage. No tachypnea. No respiratory distress. She has no decreased breath sounds. She has no rhonchi.  Abdominal: Soft. There is no tenderness.  Musculoskeletal: She exhibits no edema.  Lymphadenopathy:       Head (right side): Submandibular adenopathy present.       Head (left side): Submandibular adenopathy present.    She has cervical adenopathy.       Right cervical: Superficial  cervical and deep cervical adenopathy present.       Left cervical: Superficial cervical and deep cervical adenopathy present.    She has axillary adenopathy.       Right axillary: Pectoral adenopathy present.       Left axillary: Pectoral adenopathy present.       Right: Supraclavicular adenopathy present.  Neurological: She is alert and oriented to person, place, and time. She has normal strength. No cranial nerve deficit or sensory deficit. GCS eye subscore is 4. GCS verbal subscore is 5. GCS motor subscore is 6.  Pt unwilling to raise her R arm at the shoulder due to pain but will make minimal movements. Strong and equal grip b/l. Sensation intact throughout.  Skin: Skin is warm and dry. Capillary refill takes less than 2 seconds.  Psychiatric: She has a normal mood and affect.  Nursing note and vitals reviewed.    ED Treatments / Results  Labs (all labs ordered are listed, but only abnormal results are displayed) Labs Reviewed  COMPREHENSIVE METABOLIC PANEL - Abnormal; Notable for the following components:      Result Value   Sodium 133 (*)    Chloride 98 (*)    CO2 20 (*)    Glucose, Bld 101 (*)    Creatinine, Ser 1.06 (*)    Calcium 10.8 (*)    Total Protein 6.4 (*)    Albumin 3.4 (*)    AST 53 (*)    GFR calc non Af Amer 48 (*)    GFR calc Af Amer 56 (*)    All other components within normal limits  CBC WITH DIFFERENTIAL/PLATELET - Abnormal; Notable for the following components:   RBC 3.72 (*)    MCV 103.0 (*)    MCH 34.4 (*)    All other components within normal limits  URINALYSIS, ROUTINE W REFLEX MICROSCOPIC - Abnormal; Notable for the following components:   Specific Gravity, Urine 1.040 (*)    Hgb urine dipstick SMALL (*)    Nitrite POSITIVE (*)    Leukocytes, UA MODERATE (*)    Bacteria, UA MANY (*)    Squamous Epithelial / LPF 0-5 (*)    All other components within normal limits  PROTIME-INR - Abnormal; Notable for the following components:   Prothrombin  Time 21.1 (*)    All other components within normal limits  I-STAT CG4 LACTIC ACID, ED - Abnormal; Notable for the following components:   Lactic Acid, Venous 7.91 (*)    All other components within normal limits  I-STAT CG4 LACTIC ACID, ED - Abnormal; Notable for the following components:   Lactic Acid, Venous 6.99 (*)    All other components within normal limits    EKG  EKG Interpretation None       Radiology Ct Soft Tissue Neck Wo Contrast  Result Date: 11/29/2017 CLINICAL DATA:  82 y/o F; patient undergoing evaluation for lymphadenopathy with biopsy in the left subclavian area straightening, now presenting with swelling of the left arm and chest with associated severe pain. EXAM: CT NECK WITHOUT CONTRAST TECHNIQUE: Multidetector CT imaging of the neck was performed following the standard protocol without intravenous contrast. COMPARISON:  11/24/2017 CT of the neck. FINDINGS: Pharynx and larynx: Normal. No mass or swelling. Salivary glands: No inflammation, mass, or stone. Thyroid: Normal. Lymph nodes: Interval progression of diffuse lymphadenopathy, for example a left level 5B lymph node measures 12 x 12 mm, previously 6 mm (series 3, image 88). Increased lymphadenopathy is best appreciated left posterior cervical chain and left upper axilla but increase in adenopathy is present diffusely, for example a right upper peritracheal node measuring 24 x 31 mm, previously 17 x 23 mm (series 3, image 121). Fat stranding surrounding conglomerate lymph nodes throughout the right cervical chain, right greater than left supraclavicular regions, and left axilla may represent infiltrative neoplasm or lymphedema. Vascular: Calcific atherosclerosis of the aorta and carotid siphons. Limited intracranial: Negative. Visualized orbits: Negative. Mastoids and visualized paranasal sinuses: Small left maxillary sinus mucous retention cyst. Otherwise negative. Skeleton: Stable mild cervical spondylosis. No  high-grade bony canal stenosis. Upper chest: Emphysema and scarring in bilateral upper lobes is stable. Other: None. IMPRESSION: Interval progression of lymphadenopathy from 11/24/2017 best appreciated in the left cervical level 5B station, but measurable increase is present diffusely. Findings indicate rapidly progressive lymphoproliferative or metastatic disease. Correlation with biopsy is recommended. Electronically Signed   By: Kristine Garbe M.D.   On: 11/29/2017 22:54   Ct Chest W Contrast  Result Date: 11/29/2017 CLINICAL DATA:  Left-sided chest pain and swelling, recent biopsy on right supraclavicular lymph nodes EXAM: CT CHEST WITH CONTRAST TECHNIQUE: Multidetector CT imaging of the chest was performed during intravenous contrast administration. CONTRAST:  17mL ISOVUE-300 IOPAMIDOL (ISOVUE-300) INJECTION 61% COMPARISON:  11/24/2017 FINDINGS: Cardiovascular: Atherosclerotic changes of the thoracic aorta are noted without aneurysmal dilatation or dissection. Coronary calcifications are seen. No pulmonary emboli are noted. Some attenuation of the pulmonary arterial branches is noted secondary to hilar adenopathy. Additionally some displacement of the vasculature in the neck is noted particularly on the right also related to adenopathy. Mediastinum/Nodes: Thoracic inlet demonstrates evidence of significant right supraclavicular lymphadenopathy. The largest nodal mass measures approximately 10 by 4.9 cm and causes displacement of the adjacent vascular structures in the right neck. Additional adenopathy is noted in the anterior neck similar to that seen on prior examination. Significant mediastinal and hilar adenopathy is identified. The largest of these in the right hilum measures 2.3 cm in short axis. Subcarinal lymphadenopathy is noted measuring 16 mm in short axis. Prevascular adenopathy is noted measuring 2.2 cm in short axis. The esophagus is within normal limits. Considerable adenopathy is  noted in the posterior mediastinum surrounding the descending aorta. The nodal mass measures approximately 2.9 by 3.9 cm. Considerable right axillary adenopathy  is noted extending along the lateral chest wall. Lungs/Pleura: Emphysematous changes are noted in the lungs bilaterally. Mild scarring is seen. No focal parenchymal mass is noted. No sizable effusion is seen. Upper Abdomen: Gallbladder has been surgically removed. The liver is within normal limits. The adrenal glands and visualized spleen are unremarkable. Musculoskeletal: T9 compression deformity is noted. Some lucency is noted within. It would be difficult to exclude some metastatic involvement given the findings throughout the chest. Degenerative changes of the thoracic spine are seen. No rib abnormality is noted. Considerable soft tissue mass is noted in the left anterior chest wall involving the pectoral muscles and subpectoral region. The overall size of the mass measures approximately 10.5 by 6.7 cm and Ing gallstone the subclavian and axillary artery is and veins. Significant skin thickening and edema is noted within the left breast. It would be difficult to exclude a portion of this representing a breast mass. There is apparent involvement of the chest wall although no bony destruction is seen. IMPRESSION: Diffuse lymphadenopathy involving the supraclavicular regions, mediastinum, bilateral hila as well as the axillary regions bilaterally as described above. Correlation with the recent biopsy results is recommended. It would be difficult to exclude a portion of the abnormality in the left chest wall to be related to the overlying breast given the degree of skin thickening and edema identified inferiorly. Alternatively this may represent an aggressive lymphoma. Further workup with PET-CT is recommended. T9 compression deformity with some lucencies noted within. This may be strictly related to the compression deformity although the possibility of  metastatic disease would deserve consideration as well. No other definitive bony abnormality is noted. Aortic Atherosclerosis (ICD10-I70.0) and Emphysema (ICD10-J43.9). Electronically Signed   By: Inez Catalina M.D.   On: 11/29/2017 20:01   Dg Chest Portable 1 View  Result Date: 11/29/2017 CLINICAL DATA:  Abnormal lymphadenopathy in the neck, chest swelling, initial encounter EXAM: PORTABLE CHEST 1 VIEW COMPARISON:  11/24/2017 FINDINGS: Cardiac shadow is within normal limits. Soft tissue mass lesion is again noted superimposed over the left hilum similar to that seen on recent CT of the neck. Fullness in the hilar regions is noted bilaterally likely representing some adenopathy. Aortic calcifications are seen. The lungs are clear. No bony abnormality is noted. IMPRESSION: Changes consistent with the known left upper lobe mass lesion as well as apparent hilar adenopathy particularly on the right. CT of the chest is recommended for further evaluation as previously described. Electronically Signed   By: Inez Catalina M.D.   On: 11/29/2017 19:10   [place] Core Biopsy (lymph Nodes)  Result Date: 11/28/2017 INDICATION: 82 year old female with a clinical history of rapidly progressive right submental, cervical and supraclavicular lymphadenopathy. She presents today for urgent ultrasound-guided core biopsy. EXAM: ULTRASOUND GUIDED core needle BIOPSY OF right supraclavicular lymph node MEDICATIONS: None. ANESTHESIA/SEDATION: Fentanyl 100 mcg IV; Versed 2 mg IV Moderate Sedation Time:  15 minutes The patient was continuously monitored during the procedure by the interventional radiology nurse under my direct supervision. PROCEDURE: The procedure, risks, benefits, and alternatives were explained to the patient. Questions regarding the procedure were encouraged and answered. The patient understands and consents to the procedure. The right submental region, the right cervical chain in the right supraclavicular lymph nodes or  all interrogated with ultrasound. There are numerous enlarged, hypoechoic an abnormal appearing lymph nodes in each station. The most solid appearing lymph nodes are present in the right supraclavicular station. The right submental lymph node is slightly more cystic and may  not yield viable cells. Therefore, the decision was made to proceed with biopsy of the right supraclavicular station lymph nodes. The right supraclavicular region was prepped with chlorhexidine in a sterile fashion, and a sterile drape was applied covering the operative field. A sterile gown and sterile gloves were used for the procedure. Local anesthesia was provided with 1% Lidocaine. A small dermatotomy was made. Under real-time sonographic guidance, numerous 18 gauge core biopsies were obtained of several lymph nodes in the right supraclavicular station. Biopsy specimens were placed in saline and delivered to pathology for further analysis. Post biopsy imaging demonstrates no evidence of hematoma or active bleeding. The patient tolerated the procedure well. There was no significant bleeding. COMPLICATIONS: None immediate. FINDINGS: Extensive hypoechoic lymphadenopathy throughout the right neck and right submental region. IMPRESSION: Technically successful ultrasound-guided core biopsy of right supraclavicular lymph nodes. Electronically Signed   By: Jacqulynn Cadet M.D.   On: 11/28/2017 09:04    Procedures Procedures (including critical care time)  Medications Ordered in ED Medications  0.9 %  sodium chloride infusion ( Intravenous New Bag/Given 11/29/17 2240)  iopamidol (ISOVUE-300) 61 % injection (not administered)  cefTRIAXone (ROCEPHIN) 1 g in dextrose 5 % 50 mL IVPB (1 g Intravenous New Bag/Given 11/29/17 2330)  morphine 4 MG/ML injection 4 mg (4 mg Intravenous Given 11/29/17 1837)  lactated ringers bolus 1,000 mL (0 mLs Intravenous Stopped 11/29/17 2132)  iopamidol (ISOVUE-300) 61 % injection (75 mLs  Contrast Given 11/29/17  1928)  lactated ringers bolus 1,000 mL (0 mLs Intravenous Stopped 11/29/17 2330)  methylPREDNISolone sodium succinate (SOLU-MEDROL) 125 mg/2 mL injection 125 mg (125 mg Intravenous Given 11/29/17 2240)  ketorolac (TORADOL) 15 MG/ML injection 15 mg (15 mg Intravenous Given 11/29/17 2240)     Initial Impression / Assessment and Plan / ED Course  I have reviewed the triage vital signs and the nursing notes.  Pertinent labs & imaging results that were available during my care of the patient were reviewed by me and considered in my medical decision making (see chart for details).     82 year old female with the above history presenting with sudden onset severe right arm pain is worse over the anterior aspect of the right upper arm.  Of note she had a lymph node biopsy yesterday by IR. No hx of trauma. Exam is as above and no signs of acute ischemia.  Labs were drawn in triage and patient found to have a lactic acid of 7.9.  She has significant lymphadenopathy of her bilateral neck and around the bilateral clavicles and axillas.  Lungs are clear to auscultation bilaterally.  No respiratory distress.  No erythema or significant swelling of the right upper extremity.  No numbness in patient with good grip strength.  Due to her progressive lymphadenopathy concerning for lymphoma or metastatic disease, concern for central DVT, brachial plexus impingement, SVC syndrome.  After discussion with the radiologist on proper contrast timing, CT chest ordered.  CBC, CMP, UA ordered in triage.  Fluid bolus given due to her lactic acidosis.  No obvious signs of sepsis.  No abdominal pain and no other signs of acute ischemia.   CBC and CMP notable for bicarbonate 20 likely related to her lactic acid. Chest x-ray consistent to previous x-rays with no new focal consolidation or pneumothorax. CT chest with diffuse lymphadenopathy in the supraclavicular regions mediastinum, bilateral hila and adnexal regions as well as a T9  compression deformity concerning for metastatic disease.  Doubt that her arm pain is from  these findings and she has no tenderness over the thoracic spine or numbness or weakness in the lower extremities. Repeat lactate 6.9. UA concerning for UTI therefore Rocephin given. Patient discussed with the hospitalist for admission as she has a persistent lactic acidosis of unknown origin at this time.  Hospitalist believes that the patient requires ICU due to the masses found in her neck.  He spoke with the intensivist and the intensivist has evaluated the patient believes that she is safe for stepdown.  Patient will either be admitted to the ICU or stepdown pending their discussion.  Final Clinical Impressions(s) / ED Diagnoses   Final diagnoses:  Lymphadenopathy  Right arm pain    ED Discharge Orders    None       Hillarie Harrigan [place], MD 11/30/17 0006    Forde Dandy, MD 11/30/17 1353

## 2017-11-29 NOTE — ED Notes (Signed)
Patient transported to CT 

## 2017-11-29 NOTE — Consult Note (Signed)
Name: Colleen [name] MRN: [id number] DOB: 05/08/1935    ADMISSION DATE:  11/29/2017 CONSULTATION DATE:  11/29/2017  REFERRING MD :  Dr. Aggie Moats  CHIEF COMPLAINT:  Hypoxia   HISTORY OF PRESENT ILLNESS:  82 year old female smoker (started when she was 17 and stopped 15 years ago) with PMH of A.Fib (on coumadin), CAD, Chronic Back pain, HLD, HTN, pHTN  Presents to ED on 12/8 with progressive right shoulder pain. Patient states that since Thanksgiving she has noticed swelling around her face and neck. Went to primary care office, was sent for CT which showed severe right and mild left neck lymphadenopathy, with severe left axillary lymphadenopathy with lymph edema vs lymphangitic spread of tumor and left retropectoral infiltrative mass. Sent for Biopsy per IR on 12/7. Upon arrival to ED patient denies shortness of breath however oxygen saturation is 88-90% on room air. LA 7.9, U/A with many bacteria. CT Chest with Emphysematous. CT Neck with progression of lymphadenopathy. PCCM asked to consult regarding hypoxia and concern of airway.   SIGNIFICANT EVENTS  12/8 > Presents to ED   STUDIES:  CXR 12/8 > Changes consistent with the known left upper lobe mass lesion as well as apparent hilar adenopathy particularly on the right. CT of the chest is recommended for further evaluation as previously described. CT Chest W 12/8 > Diffuse lymphadenopathy involving the supraclavicular regions, mediastinum, bilateral hila as well as the axillary regions bilaterally as described above. Correlation with the recent biopsy results is recommended. It would be difficult to exclude a portion of the abnormality in the left chest wall to be related to the overlying breast given the degree of skin thickening and edema identified inferiorly. Alternatively this may represent an aggressive lymphoma. Further workup with PET-CT is recommended.  T9 compression deformity with some lucencies noted within. This may be  strictly related to the compression deformity although the possibility of metastatic disease would deserve consideration as well. No other definitive bony abnormality is noted CT Neck 12/8 > Interval progression of lymphadenopathy from 11/24/2017 best appreciated in the left cervical level 5B station, but measurable increase is present diffusely. Findings indicate rapidly progressive lymphoproliferative or metastatic disease. Correlation with biopsy is recommended.    PAST MEDICAL HISTORY :   has a past medical history of Atrial fibrillation ([place]), Carotid artery disease ([place]), CHF (congestive heart failure) ([place]), Hyperlipidemia, Hypertension, Hypothyroidism, Shortness of breath, and TIA (transient ischemic attack) (2001).  has a past surgical history that includes Cholecystectomy; Abdominal hysterectomy (1975); Carotid endarterectomy (2001); doppler echocardiography (02/13/2010); doppler echocardiography (09/29/2007); NM MYOVIEW LTD (02/13/2010); carotid duplex (11/24/2012); carotid duplex (11/01/2010); renal duplex (02/13/2010); Video bronchoscopy (Bilateral, 08/09/2015); and Cardiac catheterization (N/A, 08/14/2015). Prior to Admission medications   Medication Sig Start Date End Date Taking? Authorizing Provider  acetaminophen (TYLENOL) 500 MG tablet Take 1,000 mg by mouth every 6 (six) hours as needed for headache (pain).   Yes [provider]  flecainide (TAMBOCOR) 100 MG tablet Take 50 mg by mouth 2 (two) times daily.   Yes [provider]  furosemide (LASIX) 40 MG tablet Take 1 tablet (40 mg total) by mouth daily. Will take extra 40mg  PRN for swelling Patient taking differently: Take 40 mg by mouth See admin instructions. Take 1 tablet (40 mg) by mouth daily, may also take extra tablet (40mg ) as needed for ankle/ leg swelling 04/19/16  Yes Larey Dresser, MD  levothyroxine (SYNTHROID, LEVOTHROID) 88 MCG tablet Take 88 mcg by mouth daily before breakfast.  Yes [provider]  lisinopril (PRINIVIL,ZESTRIL) 20 MG tablet Take 1 tablet (20 mg total) by mouth 2 (two) times daily. Patient taking differently: Take 20 mg by mouth daily.  04/09/17  Yes Lorretta Harp, MD  warfarin (COUMADIN) 5 MG tablet Take 2.5-5 mg by mouth See admin instructions. Take 1 tablet (5 mg) by mouth on Monday and Friday after supper, take 1/2 tablet (2.5 mg) on Sunday, Tuesday, Wednesday, Thursday, Saturday with supper   Yes [provider]   No Known Allergies  FAMILY HISTORY:  family history includes Arthritis in her mother and sister; Colon cancer in her brother; Heart failure in her mother; Parkinson's disease in her mother. SOCIAL HISTORY:  reports that she quit smoking about 17 years ago. Her smoking use included cigarettes. She has a 47.00 pack-year smoking history. she has never used smokeless tobacco. She reports that she drinks alcohol. She reports that she does not use drugs.  REVIEW OF SYSTEMS:   All negative; except for those that are bolded, which indicate positives.  Constitutional: weight loss, weight gain, night sweats, fevers, chills, fatigue, weakness.  HEENT: headaches, sore throat, sneezing, nasal congestion, post nasal drip, difficulty swallowing, tooth/dental problems, visual complaints, visual changes, ear aches. Neuro: difficulty with speech, weakness, numbness, ataxia. CV:  chest pain, orthopnea, PND, swelling in lower extremities, dizziness, palpitations, syncope.  Resp: cough, hemoptysis, dyspnea, wheezing. GI: heartburn, indigestion, abdominal pain, nausea, vomiting, diarrhea, constipation, change in bowel habits, loss of appetite, hematemesis, melena, hematochezia.  GU: dysuria, change in color of urine, urgency or frequency, flank pain, hematuria. MSK: joint pain or swelling, decreased range of motion. Psych: change in mood or affect, depression, anxiety, suicidal ideations, homicidal ideations. Skin: rash, itching,  bruising.  SUBJECTIVE:   VITAL SIGNS: Temp:  [97.8 F (36.6 C)] 97.8 F (36.6 C) (12/08 1756) Pulse Rate:  [86-98] 86 (12/08 2315) Resp:  [11-23] 12 (12/08 2315) BP: (132-163)/(61-87) 140/64 (12/08 2315) SpO2:  [89 %-95 %] 95 % (12/08 2315) Weight:  [74.4 kg (164 lb)] 74.4 kg (164 lb) (12/08 1756)  PHYSICAL EXAMINATION: General:  Elderly female, no distress  Neuro:  Alert, oriented, follows commands HEENT:  MMM Cardiovascular:  RRR, no MRG  Lungs:  Clear breath sounds, non-labored  Abdomen:  Obese, non-tender, active bowel sounds  Musculoskeletal:  -edema Skin:  Warm, dry, intact   Recent Labs  Lab 11/29/17 1756  NA 133*  K 4.3  CL 98*  CO2 20*  BUN 14  CREATININE 1.06*  GLUCOSE 101*   Recent Labs  Lab 11/28/17 0556 11/29/17 1756  HGB 12.1 12.8  HCT 36.4 38.3  WBC 5.4 7.7  PLT 258 291   Ct Soft Tissue Neck Wo Contrast  Result Date: 11/29/2017 CLINICAL DATA:  82 y/o F; patient undergoing evaluation for lymphadenopathy with biopsy in the left subclavian area straightening, now presenting with swelling of the left arm and chest with associated severe pain. EXAM: CT NECK WITHOUT CONTRAST TECHNIQUE: Multidetector CT imaging of the neck was performed following the standard protocol without intravenous contrast. COMPARISON:  11/24/2017 CT of the neck. FINDINGS: Pharynx and larynx: Normal. No mass or swelling. Salivary glands: No inflammation, mass, or stone. Thyroid: Normal. Lymph nodes: Interval progression of diffuse lymphadenopathy, for example a left level 5B lymph node measures 12 x 12 mm, previously 6 mm (series 3, image 88). Increased lymphadenopathy is best appreciated left posterior cervical chain and left upper axilla but increase in adenopathy is present diffusely, for example a right  upper peritracheal node measuring 24 x 31 mm, previously 17 x 23 mm (series 3, image 121). Fat stranding surrounding conglomerate lymph nodes throughout the right cervical chain, right  greater than left supraclavicular regions, and left axilla may represent infiltrative neoplasm or lymphedema. Vascular: Calcific atherosclerosis of the aorta and carotid siphons. Limited intracranial: Negative. Visualized orbits: Negative. Mastoids and visualized paranasal sinuses: Small left maxillary sinus mucous retention cyst. Otherwise negative. Skeleton: Stable mild cervical spondylosis. No high-grade bony canal stenosis. Upper chest: Emphysema and scarring in bilateral upper lobes is stable. Other: None. IMPRESSION: Interval progression of lymphadenopathy from 11/24/2017 best appreciated in the left cervical level 5B station, but measurable increase is present diffusely. Findings indicate rapidly progressive lymphoproliferative or metastatic disease. Correlation with biopsy is recommended. Electronically Signed   By: Kristine Garbe M.D.   On: 11/29/2017 22:54   Ct Chest W Contrast  Result Date: 11/29/2017 CLINICAL DATA:  Left-sided chest pain and swelling, recent biopsy on right supraclavicular lymph nodes EXAM: CT CHEST WITH CONTRAST TECHNIQUE: Multidetector CT imaging of the chest was performed during intravenous contrast administration. CONTRAST:  36mL ISOVUE-300 IOPAMIDOL (ISOVUE-300) INJECTION 61% COMPARISON:  11/24/2017 FINDINGS: Cardiovascular: Atherosclerotic changes of the thoracic aorta are noted without aneurysmal dilatation or dissection. Coronary calcifications are seen. No pulmonary emboli are noted. Some attenuation of the pulmonary arterial branches is noted secondary to hilar adenopathy. Additionally some displacement of the vasculature in the neck is noted particularly on the right also related to adenopathy. Mediastinum/Nodes: Thoracic inlet demonstrates evidence of significant right supraclavicular lymphadenopathy. The largest nodal mass measures approximately 10 by 4.9 cm and causes displacement of the adjacent vascular structures in the right neck. Additional adenopathy is  noted in the anterior neck similar to that seen on prior examination. Significant mediastinal and hilar adenopathy is identified. The largest of these in the right hilum measures 2.3 cm in short axis. Subcarinal lymphadenopathy is noted measuring 16 mm in short axis. Prevascular adenopathy is noted measuring 2.2 cm in short axis. The esophagus is within normal limits. Considerable adenopathy is noted in the posterior mediastinum surrounding the descending aorta. The nodal mass measures approximately 2.9 by 3.9 cm. Considerable right axillary adenopathy is noted extending along the lateral chest wall. Lungs/Pleura: Emphysematous changes are noted in the lungs bilaterally. Mild scarring is seen. No focal parenchymal mass is noted. No sizable effusion is seen. Upper Abdomen: Gallbladder has been surgically removed. The liver is within normal limits. The adrenal glands and visualized spleen are unremarkable. Musculoskeletal: T9 compression deformity is noted. Some lucency is noted within. It would be difficult to exclude some metastatic involvement given the findings throughout the chest. Degenerative changes of the thoracic spine are seen. No rib abnormality is noted. Considerable soft tissue mass is noted in the left anterior chest wall involving the pectoral muscles and subpectoral region. The overall size of the mass measures approximately 10.5 by 6.7 cm and Ing gallstone the subclavian and axillary artery is and veins. Significant skin thickening and edema is noted within the left breast. It would be difficult to exclude a portion of this representing a breast mass. There is apparent involvement of the chest wall although no bony destruction is seen. IMPRESSION: Diffuse lymphadenopathy involving the supraclavicular regions, mediastinum, bilateral hila as well as the axillary regions bilaterally as described above. Correlation with the recent biopsy results is recommended. It would be difficult to exclude a portion  of the abnormality in the left chest wall to be related to the overlying breast  given the degree of skin thickening and edema identified inferiorly. Alternatively this may represent an aggressive lymphoma. Further workup with PET-CT is recommended. T9 compression deformity with some lucencies noted within. This may be strictly related to the compression deformity although the possibility of metastatic disease would deserve consideration as well. No other definitive bony abnormality is noted. Aortic Atherosclerosis (ICD10-I70.0) and Emphysema (ICD10-J43.9). Electronically Signed   By: Inez Catalina M.D.   On: 11/29/2017 20:01   Dg Chest Portable 1 View  Result Date: 11/29/2017 CLINICAL DATA:  Abnormal lymphadenopathy in the neck, chest swelling, initial encounter EXAM: PORTABLE CHEST 1 VIEW COMPARISON:  11/24/2017 FINDINGS: Cardiac shadow is within normal limits. Soft tissue mass lesion is again noted superimposed over the left hilum similar to that seen on recent CT of the neck. Fullness in the hilar regions is noted bilaterally likely representing some adenopathy. Aortic calcifications are seen. The lungs are clear. No bony abnormality is noted. IMPRESSION: Changes consistent with the known left upper lobe mass lesion as well as apparent hilar adenopathy particularly on the right. CT of the chest is recommended for further evaluation as previously described. Electronically Signed   By: Inez Catalina M.D.   On: 11/29/2017 19:10   [place] Core Biopsy (lymph Nodes)  Result Date: 11/28/2017 INDICATION: 82 year old female with a clinical history of rapidly progressive right submental, cervical and supraclavicular lymphadenopathy. She presents today for urgent ultrasound-guided core biopsy. EXAM: ULTRASOUND GUIDED core needle BIOPSY OF right supraclavicular lymph node MEDICATIONS: None. ANESTHESIA/SEDATION: Fentanyl 100 mcg IV; Versed 2 mg IV Moderate Sedation Time:  15 minutes The patient was continuously monitored  during the procedure by the interventional radiology nurse under my direct supervision. PROCEDURE: The procedure, risks, benefits, and alternatives were explained to the patient. Questions regarding the procedure were encouraged and answered. The patient understands and consents to the procedure. The right submental region, the right cervical chain in the right supraclavicular lymph nodes or all interrogated with ultrasound. There are numerous enlarged, hypoechoic an abnormal appearing lymph nodes in each station. The most solid appearing lymph nodes are present in the right supraclavicular station. The right submental lymph node is slightly more cystic and may not yield viable cells. Therefore, the decision was made to proceed with biopsy of the right supraclavicular station lymph nodes. The right supraclavicular region was prepped with chlorhexidine in a sterile fashion, and a sterile drape was applied covering the operative field. A sterile gown and sterile gloves were used for the procedure. Local anesthesia was provided with 1% Lidocaine. A small dermatotomy was made. Under real-time sonographic guidance, numerous 18 gauge core biopsies were obtained of several lymph nodes in the right supraclavicular station. Biopsy specimens were placed in saline and delivered to pathology for further analysis. Post biopsy imaging demonstrates no evidence of hematoma or active bleeding. The patient tolerated the procedure well. There was no significant bleeding. COMPLICATIONS: None immediate. FINDINGS: Extensive hypoechoic lymphadenopathy throughout the right neck and right submental region. IMPRESSION: Technically successful ultrasound-guided core biopsy of right supraclavicular lymph nodes. Electronically Signed   By: Jacqulynn Cadet M.D.   On: 11/28/2017 09:04    ASSESSMENT / PLAN:  Acute Hypoxic Respiratory Failure  H/O Tobacco Use CT Chest with Emphysema, no dilatation or dissection seen  Plan  -Maintain  Oxygen Saturation 90-92 (Currently on 4 L Nasal Cannula)   -Pulmicort and Brovana  -PRN Albuterol   Lactic Acidosis in setting of hypoxia vs UTI   -LA 7.91 > 6.99  Plan  -  Trend LA   Rapidly Progressing Right neck malignancy  Plan  -Follow Biopsy Results  -CT Shows Patent airway > patient currently with no distress, no indication at this time for intubation.   UTI Plan -Urine Culture pending  -continue Rocephin   H/O AFib Plan -Hold home Coumadin  -Place on Heparin gtt (can transition back once all testing done to verify no need for procedure)    Hayden Pedro, AGACNP-BC [facility]  Pgr: [phone]  PCCM Pgr: [phone]

## 2017-11-29 NOTE — ED Provider Notes (Addendum)
I saw and evaluated the patient, reviewed the resident's note and I agree with the findings and plan.   EKG Interpretation None      82 year old female who presents with right arm pain. History of afib, chf, HTN, HLD. Reports significant lymphadenopathy involving bilateral neck since thanksgiving. S/p biopsy yesterday, and since then with severe right arm pain. No significant right arm swelling, numbness or weakness. No trauma, fever, chills, nausea, vomiting, chest pain or dyspnea.   RUE is NV in tact. Exquisitely tender in the axillary region of the right arm. Compartments are soft. Unclear etiology of her pain at this time. CT chest obtained, and there is no obvious etiology of her symptoms. She has elevated lactic of 7.9. Unclear etiology of this, but do not think this is septic shock. No other symptoms of infection but does have UTI. I do not think UTI is the only etiology of her high elevated lactic acid though. Will treat with antibiotics. Elevated lactate likely multifactorial, but question if underlying malignancy may place a huge role given rapidly progressive lymphadenoapthy. No signs or findings consistent with limb ischemia or compartment syndrome. CT soft tissue neck w/ worsening lymphadenopathy c/f malignancy. Patent airway, but does develop oxygen requirement in ED. Denies SOB. CT chest was no angio, but no obvious infiltrate, edema, or central PE.  Will plan on admission to medicine service for ongoing evaluation.    Forde Dandy, MD 11/29/17 2351    Forde Dandy, MD 11/30/17 1352

## 2017-11-29 NOTE — Consult Note (Addendum)
[facility] Consult History and Physical  Colleen [name]:[id number] DOB: 05-05-1935 DOA: 11/29/2017  Referring physician:  PCP: Ann Held, DO   Chief Complaint: "My arm does hurt bad."      HPI: Colleen [name] is a 82 y.o. female  with past medical history significant for atrial fibrillation, congestive heart failure, chronic anticoagulation on warfarin, hypertension, low thyroid and stroke history.  Presents emergency room with chief complaint arm pain.  Since Thanksgiving patient has rapidly progressing right neck mass.  Was seen yesterday for biopsy by interventional radiology to try to diagnose malignancy.  Patient came to the emergency room today due to extreme right arm and neck pain.  Patient's biopsy was done in the chest.  Patient states that he does not feel weak in this arm but has difficulty moving it due to pain.  ED course: CT chest done with contrast.  Diffuse of adenopathy seen.  Patient was found to have an elevated lactic acid.  Bolus of fluids was given.  Hospitalist consulted for admission.    Review of Systems:  As per HPI otherwise 10 point review of systems negative.    Past Medical History:  Diagnosis Date  . Atrial fibrillation ([place])   . Carotid artery disease ([place])   . CHF (congestive heart failure) ([place])   . Hyperlipidemia   . Hypertension   . Hypothyroidism   . Shortness of breath   . TIA (transient ischemic attack) 2001   Past Surgical History:  Procedure Laterality Date  . ABDOMINAL HYSTERECTOMY  1975   TAH,BSO  . CARDIAC CATHETERIZATION N/A 08/14/2015   Procedure: Right Heart Cath;  Surgeon: Larey Dresser, MD;  Location: [facility];  Service: Cardiovascular;  Laterality: N/A;  . carotid duplex  11/24/2012  . carotid duplex  11/01/2010  . CAROTID ENDARTERECTOMY  2001   left  . CHOLECYSTECTOMY    . DOPPLER ECHOCARDIOGRAPHY  02/13/2010   EF>55%  . DOPPLER ECHOCARDIOGRAPHY  09/29/2007  . NM MYOVIEW LTD  02/13/2010    . renal duplex  02/13/2010  . VIDEO BRONCHOSCOPY Bilateral 08/09/2015   Procedure: VIDEO BRONCHOSCOPY WITHOUT FLUORO;  Surgeon: Collene Gobble, MD;  Location: [facility];  Service: Cardiopulmonary;  Laterality: Bilateral;   Social History:  reports that she quit smoking about 17 years ago. Her smoking use included cigarettes. She has a 47.00 pack-year smoking history. she has never used smokeless tobacco. She reports that she drinks alcohol. She reports that she does not use drugs.  No Known Allergies  Family History  Problem Relation Age of Onset  . Heart failure Mother   . Parkinson's disease Mother   . Arthritis Mother   . Arthritis Sister   . Colon cancer Brother   . Rheumatologic disease Neg Hx   . Lung disease Neg Hx      Prior to Admission medications   Medication Sig Start Date End Date Taking? Authorizing Provider  acetaminophen (TYLENOL) 500 MG tablet Take 1,000 mg by mouth every 6 (six) hours as needed for headache (pain).   Yes [provider]  flecainide (TAMBOCOR) 100 MG tablet Take 50 mg by mouth 2 (two) times daily.   Yes [provider]  furosemide (LASIX) 40 MG tablet Take 1 tablet (40 mg total) by mouth daily. Will take extra 40mg  PRN for swelling Patient taking differently: Take 40 mg by mouth See admin instructions. Take 1 tablet (40 mg) by mouth daily, may also take extra tablet (40mg )  as needed for ankle/ leg swelling 04/19/16  Yes Larey Dresser, MD  levothyroxine (SYNTHROID, LEVOTHROID) 88 MCG tablet Take 88 mcg by mouth daily before breakfast.    Yes [provider]  lisinopril (PRINIVIL,ZESTRIL) 20 MG tablet Take 1 tablet (20 mg total) by mouth 2 (two) times daily. Patient taking differently: Take 20 mg by mouth daily.  04/09/17  Yes Lorretta Harp, MD  warfarin (COUMADIN) 5 MG tablet Take 2.5-5 mg by mouth See admin instructions. Take 1 tablet (5 mg) by mouth on Monday and Friday after supper, take 1/2 tablet (2.5 mg) on Sunday,  Tuesday, Wednesday, Thursday, Saturday with supper   Yes [provider]   Physical Exam: Vitals:   11/29/17 2045 11/29/17 2100 11/29/17 2115 11/29/17 2130  BP: (!) 142/67 137/63 (!) 148/67 (!) 160/61  Pulse: 90 88 93 92  Resp: 17 13 12 14   Temp:      TempSrc:      SpO2: (!) 89% 90% 90% 93%  Weight:      Height:        Wt Readings from Last 3 Encounters:  11/29/17 74.4 kg (164 lb)  11/28/17 73 kg (161 lb)  11/14/17 74.4 kg (164 lb)    General:  Appears calm and comfortable; mild resp distress, alert and oriented x3 Eyes:  PERRL, EOMI, normal lids, iris ENT:  grossly normal hearing, lips & tongue Neck: Her right lymphadenopathy/mass, midline trachea shift to the left Cardiovascular:  RRR, no m/r/g. No LE edema.  Respiratory:  CTA bilaterally, no w/r/r. Abdomen:  soft, ntnd Skin:  no rash or induration seen on limited exam, biopsy site on right upper chest and clavicle Musculoskeletal:  grossly normal tone BLE; able to fully examine right upper extremity due to pain, left upper extremity normal Psychiatric:  grossly normal mood and affect, speech fluent and appropriate Neurologic:  CN 2-12 grossly intact, moves all extremities in coordinated fashion.          Labs on Admission:  Basic Metabolic Panel: Recent Labs  Lab 11/29/17 1756  NA 133*  K 4.3  CL 98*  CO2 20*  GLUCOSE 101*  BUN 14  CREATININE 1.06*  CALCIUM 10.8*   Liver Function Tests: Recent Labs  Lab 11/29/17 1756  AST 53*  ALT 22  ALKPHOS 74  BILITOT 0.6  PROT 6.4*  ALBUMIN 3.4*   No results for input(s): LIPASE, AMYLASE in the last 168 hours. No results for input(s): AMMONIA in the last 168 hours. CBC: Recent Labs  Lab 11/28/17 0556 11/29/17 1756  WBC 5.4 7.7  NEUTROABS  --  6.1  HGB 12.1 12.8  HCT 36.4 38.3  MCV 101.7* 103.0*  PLT 258 291   Cardiac Enzymes: No results for input(s): CKTOTAL, CKMB, CKMBINDEX, TROPONINI in the last 168 hours.  BNP (last 3 results) No results  for input(s): BNP in the last 8760 hours.  ProBNP (last 3 results) No results for input(s): PROBNP in the last 8760 hours.   Serum creatinine: 1.06 mg/dL (H) 11/29/17 1756 Estimated creatinine clearance: 41.8 mL/min (A)  CBG: No results for input(s): GLUCAP in the last 168 hours.  Radiological Exams on Admission: Ct Chest W Contrast  Result Date: 11/29/2017 CLINICAL DATA:  Left-sided chest pain and swelling, recent biopsy on right supraclavicular lymph nodes EXAM: CT CHEST WITH CONTRAST TECHNIQUE: Multidetector CT imaging of the chest was performed during intravenous contrast administration. CONTRAST:  41mL ISOVUE-300 IOPAMIDOL (ISOVUE-300) INJECTION 61% COMPARISON:  11/24/2017 FINDINGS: Cardiovascular: Atherosclerotic  changes of the thoracic aorta are noted without aneurysmal dilatation or dissection. Coronary calcifications are seen. No pulmonary emboli are noted. Some attenuation of the pulmonary arterial branches is noted secondary to hilar adenopathy. Additionally some displacement of the vasculature in the neck is noted particularly on the right also related to adenopathy. Mediastinum/Nodes: Thoracic inlet demonstrates evidence of significant right supraclavicular lymphadenopathy. The largest nodal mass measures approximately 10 by 4.9 cm and causes displacement of the adjacent vascular structures in the right neck. Additional adenopathy is noted in the anterior neck similar to that seen on prior examination. Significant mediastinal and hilar adenopathy is identified. The largest of these in the right hilum measures 2.3 cm in short axis. Subcarinal lymphadenopathy is noted measuring 16 mm in short axis. Prevascular adenopathy is noted measuring 2.2 cm in short axis. The esophagus is within normal limits. Considerable adenopathy is noted in the posterior mediastinum surrounding the descending aorta. The nodal mass measures approximately 2.9 by 3.9 cm. Considerable right axillary adenopathy is  noted extending along the lateral chest wall. Lungs/Pleura: Emphysematous changes are noted in the lungs bilaterally. Mild scarring is seen. No focal parenchymal mass is noted. No sizable effusion is seen. Upper Abdomen: Gallbladder has been surgically removed. The liver is within normal limits. The adrenal glands and visualized spleen are unremarkable. Musculoskeletal: T9 compression deformity is noted. Some lucency is noted within. It would be difficult to exclude some metastatic involvement given the findings throughout the chest. Degenerative changes of the thoracic spine are seen. No rib abnormality is noted. Considerable soft tissue mass is noted in the left anterior chest wall involving the pectoral muscles and subpectoral region. The overall size of the mass measures approximately 10.5 by 6.7 cm and Ing gallstone the subclavian and axillary artery is and veins. Significant skin thickening and edema is noted within the left breast. It would be difficult to exclude a portion of this representing a breast mass. There is apparent involvement of the chest wall although no bony destruction is seen. IMPRESSION: Diffuse lymphadenopathy involving the supraclavicular regions, mediastinum, bilateral hila as well as the axillary regions bilaterally as described above. Correlation with the recent biopsy results is recommended. It would be difficult to exclude a portion of the abnormality in the left chest wall to be related to the overlying breast given the degree of skin thickening and edema identified inferiorly. Alternatively this may represent an aggressive lymphoma. Further workup with PET-CT is recommended. T9 compression deformity with some lucencies noted within. This may be strictly related to the compression deformity although the possibility of metastatic disease would deserve consideration as well. No other definitive bony abnormality is noted. Aortic Atherosclerosis (ICD10-I70.0) and Emphysema (ICD10-J43.9).  Electronically Signed   By: Inez Catalina M.D.   On: 11/29/2017 20:01   Dg Chest Portable 1 View  Result Date: 11/29/2017 CLINICAL DATA:  Abnormal lymphadenopathy in the neck, chest swelling, initial encounter EXAM: PORTABLE CHEST 1 VIEW COMPARISON:  11/24/2017 FINDINGS: Cardiac shadow is within normal limits. Soft tissue mass lesion is again noted superimposed over the left hilum similar to that seen on recent CT of the neck. Fullness in the hilar regions is noted bilaterally likely representing some adenopathy. Aortic calcifications are seen. The lungs are clear. No bony abnormality is noted. IMPRESSION: Changes consistent with the known left upper lobe mass lesion as well as apparent hilar adenopathy particularly on the right. CT of the chest is recommended for further evaluation as previously described. Electronically Signed   By: Elta Guadeloupe  Lukens M.D.   On: 11/29/2017 19:10   [place] Core Biopsy (lymph Nodes)  Result Date: 11/28/2017 INDICATION: 82 year old female with a clinical history of rapidly progressive right submental, cervical and supraclavicular lymphadenopathy. She presents today for urgent ultrasound-guided core biopsy. EXAM: ULTRASOUND GUIDED core needle BIOPSY OF right supraclavicular lymph node MEDICATIONS: None. ANESTHESIA/SEDATION: Fentanyl 100 mcg IV; Versed 2 mg IV Moderate Sedation Time:  15 minutes The patient was continuously monitored during the procedure by the interventional radiology nurse under my direct supervision. PROCEDURE: The procedure, risks, benefits, and alternatives were explained to the patient. Questions regarding the procedure were encouraged and answered. The patient understands and consents to the procedure. The right submental region, the right cervical chain in the right supraclavicular lymph nodes or all interrogated with ultrasound. There are numerous enlarged, hypoechoic an abnormal appearing lymph nodes in each station. The most solid appearing lymph nodes are  present in the right supraclavicular station. The right submental lymph node is slightly more cystic and may not yield viable cells. Therefore, the decision was made to proceed with biopsy of the right supraclavicular station lymph nodes. The right supraclavicular region was prepped with chlorhexidine in a sterile fashion, and a sterile drape was applied covering the operative field. A sterile gown and sterile gloves were used for the procedure. Local anesthesia was provided with 1% Lidocaine. A small dermatotomy was made. Under real-time sonographic guidance, numerous 18 gauge core biopsies were obtained of several lymph nodes in the right supraclavicular station. Biopsy specimens were placed in saline and delivered to pathology for further analysis. Post biopsy imaging demonstrates no evidence of hematoma or active bleeding. The patient tolerated the procedure well. There was no significant bleeding. COMPLICATIONS: None immediate. FINDINGS: Extensive hypoechoic lymphadenopathy throughout the right neck and right submental region. IMPRESSION: Technically successful ultrasound-guided core biopsy of right supraclavicular lymph nodes. Electronically Signed   By: Jacqulynn Cadet M.D.   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 Valetta Mole who is on call for an ear nose and throat who feels this patient needs to be placed in ICU and intubated, he states that  there is nothing acutely he would do surgically for the patient she is a poor neck surgery candidate  Anticoagulation Patient's anticoagulation was held briefly, a candidate for CTA to rule out PE Given issue such a need for neck CT results, recent biopsy and airway will leave anticoag decision to CCM  Hypercalcemia, mild Corrected calcium on admission 11.3 1 L fluids given by the ED Ordered IV fluids Will need Lasix once airway stabilized  Elevated lactic acid Likely due to hypoxia combined with low oral intake and dehydration We will give IV fluids and monitor Do not believe this has an infectious cause  Renal insufficiency Likely due to dehydration and hypoxia Would treat underlying issues and follow labs  Thanks for allowing [place] to help with the care of this patient.  Elwin Mocha, MD Family Medicine [facility] [email or web address] Password [email or web address]

## 2017-11-29 NOTE — ED Triage Notes (Addendum)
Pt is being worked up for swollen lymph nodes in neck area, also has had biopsy on left subclavian area yesterday, now has swelling left arm/chest area and severe pain that is unrelieved with Tramadol. Pt has obvious swelling in neck,   Pt is being followed by Dr. Bubba Camp -- endocrinologist

## 2017-11-29 NOTE — ED Notes (Signed)
Dr.Liu informed of pt Lactic Acid results ED-Lab

## 2017-11-30 ENCOUNTER — Other Ambulatory Visit: Payer: Self-pay

## 2017-11-30 ENCOUNTER — Inpatient Hospital Stay (HOSPITAL_COMMUNITY): Payer: Medicare Other

## 2017-11-30 DIAGNOSIS — E46 Unspecified protein-calorie malnutrition: Secondary | ICD-10-CM | POA: Diagnosis not present

## 2017-11-30 DIAGNOSIS — C8580 Other specified types of non-Hodgkin lymphoma, unspecified site: Secondary | ICD-10-CM | POA: Diagnosis not present

## 2017-11-30 DIAGNOSIS — N289 Disorder of kidney and ureter, unspecified: Secondary | ICD-10-CM | POA: Diagnosis present

## 2017-11-30 DIAGNOSIS — I480 Paroxysmal atrial fibrillation: Secondary | ICD-10-CM | POA: Diagnosis not present

## 2017-11-30 DIAGNOSIS — I890 Lymphedema, not elsewhere classified: Secondary | ICD-10-CM | POA: Diagnosis not present

## 2017-11-30 DIAGNOSIS — N1330 Unspecified hydronephrosis: Secondary | ICD-10-CM | POA: Diagnosis not present

## 2017-11-30 DIAGNOSIS — E872 Acidosis, unspecified: Secondary | ICD-10-CM

## 2017-11-30 DIAGNOSIS — N179 Acute kidney failure, unspecified: Secondary | ICD-10-CM | POA: Diagnosis not present

## 2017-11-30 DIAGNOSIS — E871 Hypo-osmolality and hyponatremia: Secondary | ICD-10-CM | POA: Diagnosis not present

## 2017-11-30 DIAGNOSIS — R599 Enlarged lymph nodes, unspecified: Secondary | ICD-10-CM | POA: Diagnosis not present

## 2017-11-30 DIAGNOSIS — C8590 Non-Hodgkin lymphoma, unspecified, unspecified site: Secondary | ICD-10-CM | POA: Diagnosis not present

## 2017-11-30 DIAGNOSIS — R339 Retention of urine, unspecified: Secondary | ICD-10-CM | POA: Diagnosis not present

## 2017-11-30 DIAGNOSIS — Z5111 Encounter for antineoplastic chemotherapy: Secondary | ICD-10-CM | POA: Diagnosis not present

## 2017-11-30 DIAGNOSIS — D62 Acute posthemorrhagic anemia: Secondary | ICD-10-CM | POA: Diagnosis present

## 2017-11-30 DIAGNOSIS — K5900 Constipation, unspecified: Secondary | ICD-10-CM | POA: Diagnosis present

## 2017-11-30 DIAGNOSIS — R0902 Hypoxemia: Secondary | ICD-10-CM | POA: Diagnosis present

## 2017-11-30 DIAGNOSIS — E8771 Transfusion associated circulatory overload: Secondary | ICD-10-CM | POA: Diagnosis not present

## 2017-11-30 DIAGNOSIS — N3000 Acute cystitis without hematuria: Secondary | ICD-10-CM | POA: Diagnosis not present

## 2017-11-30 DIAGNOSIS — R0602 Shortness of breath: Secondary | ICD-10-CM | POA: Diagnosis not present

## 2017-11-30 DIAGNOSIS — E785 Hyperlipidemia, unspecified: Secondary | ICD-10-CM | POA: Diagnosis present

## 2017-11-30 DIAGNOSIS — Z79899 Other long term (current) drug therapy: Secondary | ICD-10-CM | POA: Diagnosis not present

## 2017-11-30 DIAGNOSIS — R591 Generalized enlarged lymph nodes: Secondary | ICD-10-CM

## 2017-11-30 DIAGNOSIS — M5412 Radiculopathy, cervical region: Secondary | ICD-10-CM | POA: Diagnosis not present

## 2017-11-30 DIAGNOSIS — R05 Cough: Secondary | ICD-10-CM | POA: Diagnosis not present

## 2017-11-30 DIAGNOSIS — M79601 Pain in right arm: Secondary | ICD-10-CM | POA: Diagnosis not present

## 2017-11-30 DIAGNOSIS — I5032 Chronic diastolic (congestive) heart failure: Secondary | ICD-10-CM | POA: Diagnosis present

## 2017-11-30 DIAGNOSIS — R531 Weakness: Secondary | ICD-10-CM | POA: Diagnosis not present

## 2017-11-30 DIAGNOSIS — B009 Herpesviral infection, unspecified: Secondary | ICD-10-CM | POA: Diagnosis not present

## 2017-11-30 DIAGNOSIS — I4891 Unspecified atrial fibrillation: Secondary | ICD-10-CM | POA: Diagnosis not present

## 2017-11-30 DIAGNOSIS — J9601 Acute respiratory failure with hypoxia: Secondary | ICD-10-CM

## 2017-11-30 DIAGNOSIS — C8581 Other specified types of non-Hodgkin lymphoma, lymph nodes of head, face, and neck: Secondary | ICD-10-CM | POA: Diagnosis not present

## 2017-11-30 DIAGNOSIS — I361 Nonrheumatic tricuspid (valve) insufficiency: Secondary | ICD-10-CM | POA: Diagnosis not present

## 2017-11-30 DIAGNOSIS — I110 Hypertensive heart disease with heart failure: Secondary | ICD-10-CM | POA: Diagnosis present

## 2017-11-30 DIAGNOSIS — R32 Unspecified urinary incontinence: Secondary | ICD-10-CM | POA: Diagnosis not present

## 2017-11-30 DIAGNOSIS — R609 Edema, unspecified: Secondary | ICD-10-CM | POA: Diagnosis not present

## 2017-11-30 DIAGNOSIS — M7989 Other specified soft tissue disorders: Secondary | ICD-10-CM | POA: Diagnosis present

## 2017-11-30 DIAGNOSIS — R600 Localized edema: Secondary | ICD-10-CM | POA: Diagnosis not present

## 2017-11-30 DIAGNOSIS — Z7901 Long term (current) use of anticoagulants: Secondary | ICD-10-CM | POA: Diagnosis not present

## 2017-11-30 DIAGNOSIS — D72829 Elevated white blood cell count, unspecified: Secondary | ICD-10-CM | POA: Diagnosis not present

## 2017-11-30 DIAGNOSIS — I482 Chronic atrial fibrillation: Secondary | ICD-10-CM | POA: Diagnosis present

## 2017-11-30 DIAGNOSIS — Z9981 Dependence on supplemental oxygen: Secondary | ICD-10-CM | POA: Diagnosis not present

## 2017-11-30 DIAGNOSIS — R5381 Other malaise: Secondary | ICD-10-CM | POA: Diagnosis not present

## 2017-11-30 DIAGNOSIS — E875 Hyperkalemia: Secondary | ICD-10-CM | POA: Diagnosis not present

## 2017-11-30 DIAGNOSIS — E8779 Other fluid overload: Secondary | ICD-10-CM | POA: Diagnosis not present

## 2017-11-30 DIAGNOSIS — Z79890 Hormone replacement therapy (postmenopausal): Secondary | ICD-10-CM | POA: Diagnosis not present

## 2017-11-30 DIAGNOSIS — I469 Cardiac arrest, cause unspecified: Secondary | ICD-10-CM | POA: Diagnosis not present

## 2017-11-30 DIAGNOSIS — C8330 Diffuse large B-cell lymphoma, unspecified site: Secondary | ICD-10-CM | POA: Diagnosis not present

## 2017-11-30 DIAGNOSIS — C8591 Non-Hodgkin lymphoma, unspecified, lymph nodes of head, face, and neck: Secondary | ICD-10-CM | POA: Diagnosis not present

## 2017-11-30 DIAGNOSIS — S143XXS Injury of brachial plexus, sequela: Secondary | ICD-10-CM | POA: Diagnosis not present

## 2017-11-30 DIAGNOSIS — R570 Cardiogenic shock: Secondary | ICD-10-CM | POA: Diagnosis not present

## 2017-11-30 DIAGNOSIS — A609 Anogenital herpesviral infection, unspecified: Secondary | ICD-10-CM | POA: Diagnosis not present

## 2017-11-30 DIAGNOSIS — E860 Dehydration: Secondary | ICD-10-CM | POA: Diagnosis present

## 2017-11-30 DIAGNOSIS — M79621 Pain in right upper arm: Secondary | ICD-10-CM | POA: Diagnosis not present

## 2017-11-30 DIAGNOSIS — E039 Hypothyroidism, unspecified: Secondary | ICD-10-CM | POA: Diagnosis present

## 2017-11-30 DIAGNOSIS — M5410 Radiculopathy, site unspecified: Secondary | ICD-10-CM | POA: Diagnosis not present

## 2017-11-30 DIAGNOSIS — C8331 Diffuse large B-cell lymphoma, lymph nodes of head, face, and neck: Secondary | ICD-10-CM | POA: Diagnosis not present

## 2017-11-30 DIAGNOSIS — M109 Gout, unspecified: Secondary | ICD-10-CM | POA: Diagnosis present

## 2017-11-30 DIAGNOSIS — I10 Essential (primary) hypertension: Secondary | ICD-10-CM | POA: Diagnosis not present

## 2017-11-30 DIAGNOSIS — Z9049 Acquired absence of other specified parts of digestive tract: Secondary | ICD-10-CM | POA: Diagnosis not present

## 2017-11-30 DIAGNOSIS — Z90710 Acquired absence of both cervix and uterus: Secondary | ICD-10-CM | POA: Diagnosis not present

## 2017-11-30 DIAGNOSIS — G540 Brachial plexus disorders: Secondary | ICD-10-CM | POA: Diagnosis not present

## 2017-11-30 DIAGNOSIS — Z8673 Personal history of transient ischemic attack (TIA), and cerebral infarction without residual deficits: Secondary | ICD-10-CM | POA: Diagnosis not present

## 2017-11-30 DIAGNOSIS — R52 Pain, unspecified: Secondary | ICD-10-CM | POA: Diagnosis not present

## 2017-11-30 LAB — MAGNESIUM: MAGNESIUM: 1.4 mg/dL — AB (ref 1.7–2.4)

## 2017-11-30 LAB — BASIC METABOLIC PANEL
ANION GAP: 15 (ref 5–15)
BUN: 15 mg/dL (ref 6–20)
CALCIUM: 10.3 mg/dL (ref 8.9–10.3)
CO2: 23 mmol/L (ref 22–32)
Chloride: 99 mmol/L — ABNORMAL LOW (ref 101–111)
Creatinine, Ser: 0.91 mg/dL (ref 0.44–1.00)
GFR, EST NON AFRICAN AMERICAN: 58 mL/min — AB (ref >60)
Glucose, Bld: 113 mg/dL — ABNORMAL HIGH (ref 65–99)
POTASSIUM: 4.9 mmol/L (ref 3.5–5.1)
Sodium: 137 mmol/L (ref 135–145)

## 2017-11-30 LAB — CBC
HEMATOCRIT: 33.8 % — AB (ref 36.0–46.0)
HEMOGLOBIN: 11.4 g/dL — AB (ref 12.0–15.0)
MCH: 34.4 pg — ABNORMAL HIGH (ref 26.0–34.0)
MCHC: 33.7 g/dL (ref 30.0–36.0)
MCV: 102.1 fL — ABNORMAL HIGH (ref 78.0–100.0)
Platelets: 227 10*3/uL (ref 150–400)
RBC: 3.31 MIL/uL — AB (ref 3.87–5.11)
RDW: 14.3 % (ref 11.5–15.5)
WBC: 4.6 10*3/uL (ref 4.0–10.5)

## 2017-11-30 LAB — HEPARIN LEVEL (UNFRACTIONATED): Heparin Unfractionated: 0.16 IU/mL — ABNORMAL LOW (ref 0.30–0.70)

## 2017-11-30 LAB — PROTIME-INR
INR: 1.93
PROTHROMBIN TIME: 21.9 s — AB (ref 11.4–15.2)

## 2017-11-30 LAB — PROCALCITONIN

## 2017-11-30 LAB — LACTIC ACID, PLASMA
LACTIC ACID, VENOUS: 4.9 mmol/L — AB (ref 0.5–1.9)
Lactic Acid, Venous: 4.9 mmol/L (ref 0.5–1.9)

## 2017-11-30 LAB — PHOSPHORUS: PHOSPHORUS: 4.3 mg/dL (ref 2.5–4.6)

## 2017-11-30 MED ORDER — ONDANSETRON HCL 4 MG PO TABS: 4.0000 mg | ORAL_TABLET | Freq: Four times a day (QID) | ORAL | Status: DC | PRN

## 2017-11-30 MED ORDER — HYDROMORPHONE HCL 1 MG/ML IJ SOLN
0.5000 mg | INTRAMUSCULAR | Status: DC | PRN
  Administered 2017-11-30 – 2017-12-01 (×3): 1 mg via INTRAVENOUS
  Filled 2017-11-30 (×3): qty 1

## 2017-11-30 MED ORDER — MORPHINE SULFATE (PF) 2 MG/ML IV SOLN
2.0000 mg | INTRAVENOUS | Status: DC | PRN
  Administered 2017-11-30 (×2): 4 mg via INTRAVENOUS
  Filled 2017-11-30 (×2): qty 2

## 2017-11-30 MED ORDER — ACETAMINOPHEN 650 MG RE SUPP: 650.0000 mg | Freq: Four times a day (QID) | RECTAL | Status: DC | PRN

## 2017-11-30 MED ORDER — POLYETHYLENE GLYCOL 3350 17 G PO PACK
17.0000 g | PACK | Freq: Every day | ORAL | Status: DC
  Filled 2017-11-30 (×4): qty 1

## 2017-11-30 MED ORDER — ACETAMINOPHEN 325 MG PO TABS: 650.0000 mg | ORAL_TABLET | Freq: Four times a day (QID) | ORAL | Status: DC | PRN

## 2017-11-30 MED ORDER — HYDROCODONE-ACETAMINOPHEN 5-325 MG PO TABS
1.0000 | ORAL_TABLET | Freq: Four times a day (QID) | ORAL | Status: DC | PRN
  Administered 2017-11-30 (×2): 1 via ORAL
  Filled 2017-11-30 (×2): qty 1

## 2017-11-30 MED ORDER — LEVOTHYROXINE SODIUM 88 MCG PO TABS
88.0000 ug | ORAL_TABLET | Freq: Every day | ORAL | Status: DC
  Administered 2017-11-30 – 2017-12-09 (×10): 88 ug via ORAL
  Filled 2017-11-30 (×12): qty 1

## 2017-11-30 MED ORDER — GADOBENATE DIMEGLUMINE 529 MG/ML IV SOLN
15.0000 mL | Freq: Once | INTRAVENOUS | Status: AC | PRN
  Administered 2017-11-30: 15 mL via INTRAVENOUS

## 2017-11-30 MED ORDER — ONDANSETRON HCL 4 MG/2ML IJ SOLN: 4.0000 mg | Freq: Four times a day (QID) | INTRAMUSCULAR | Status: DC | PRN

## 2017-11-30 MED ORDER — FLECAINIDE ACETATE 50 MG PO TABS
50.0000 mg | ORAL_TABLET | Freq: Two times a day (BID) | ORAL | Status: DC
  Administered 2017-11-30 – 2017-12-04 (×11): 50 mg via ORAL
  Filled 2017-11-30 (×12): qty 1

## 2017-11-30 MED ORDER — SENNOSIDES-DOCUSATE SODIUM 8.6-50 MG PO TABS
1.0000 | ORAL_TABLET | Freq: Every day | ORAL | Status: DC
  Administered 2017-12-05: 1 via ORAL
  Filled 2017-11-30 (×6): qty 1

## 2017-11-30 MED ORDER — HYDRALAZINE HCL 20 MG/ML IJ SOLN: 10.0000 mg | Freq: Three times a day (TID) | INTRAMUSCULAR | Status: DC | PRN

## 2017-11-30 MED ORDER — HEPARIN (PORCINE) IN NACL 100-0.45 UNIT/ML-% IJ SOLN
1050.0000 [IU]/h | INTRAMUSCULAR | Status: DC
  Administered 2017-11-30: 800 [IU]/h via INTRAVENOUS
  Filled 2017-11-30: qty 250

## 2017-11-30 MED ORDER — MAGNESIUM SULFATE 2 GM/50ML IV SOLN
2.0000 g | Freq: Once | INTRAVENOUS | Status: AC
  Administered 2017-11-30: 2 g via INTRAVENOUS
  Filled 2017-11-30: qty 50

## 2017-11-30 MED ORDER — OXYCODONE HCL 5 MG PO TABS
5.0000 mg | ORAL_TABLET | ORAL | Status: DC | PRN
  Administered 2017-11-30 – 2017-12-04 (×13): 5 mg via ORAL
  Filled 2017-11-30 (×13): qty 1

## 2017-11-30 MED ORDER — DEXTROSE 5 % IV SOLN
1.0000 g | INTRAVENOUS | Status: DC
  Administered 2017-11-30 – 2017-12-05 (×6): 1 g via INTRAVENOUS
  Filled 2017-11-30 (×6): qty 10

## 2017-11-30 NOTE — Progress Notes (Signed)
[facility] PLAN OF CARE NOTE Patient: Colleen [name] OHK:[id number]   PCP: Ann Held, DO DOB: 14-Apr-1935   DOA: 11/29/2017   DOS: 11/30/2017    Patient was admitted by my colleague Dr. Aggie Moats earlier on 11/30/2017. I have reviewed the H&P as well as assessment and plan and agree with the same. Important changes in the plan are listed below.  Plan of care: Active Problems:   Hypoxia   Lymphadenopathy   Acute cystitis without hematuria   Lactic acidosis add pain meds.  Avoiding steroids until biopsy result are back  Author: Berle Mull, MD [facility] Pager: [phone] 11/30/2017 12:17 PM   If 7PM-7AM, please contact night-coverage at [email or web address], password [facility]

## 2017-11-30 NOTE — Progress Notes (Signed)
ANTICOAGULATION CONSULT NOTE - Initial Consult  Pharmacy Consult for Heparin (warfarin on hold) Indication: atrial fibrillation  No Known Allergies  Patient Measurements: Height: 5\' 4"  (162.6 cm) Weight: 166 lb 0.1 oz (75.3 kg) IBW/kg (Calculated) : 54.7  Vital Signs: Temp: 97.7 F (36.5 C) (12/09 0208) Temp Source: Oral (12/09 0208) BP: 153/75 (12/09 0208) Pulse Rate: 87 (12/09 0208)  Labs: Recent Labs    11/28/17 0556 11/29/17 1756  HGB 12.1 12.8  HCT 36.4 38.3  PLT 258 291  APTT 38*  --   LABPROT 21.7* 21.1*  INR 1.91 1.84  CREATININE  --  1.06*    Estimated Creatinine Clearance: 42 mL/min (A) (by C-G formula based on SCr of 1.06 mg/dL (H)).   Medical History: Past Medical History:  Diagnosis Date  . Atrial fibrillation ([place])   . Carotid artery disease ([place])   . CHF (congestive heart failure) ([place])   . Hyperlipidemia   . Hypertension   . Hypothyroidism   . Shortness of breath   . TIA (transient ischemic attack) 2001    Assessment: 82 y/o F with rapidly progressing lymphadenopathy of the neck (likely malignancy). Warfarin PTA for afib. INR is 1.84. Holding warfarin and starting heparin in case any procedures need to be done. CBC ok.   Goal of Therapy:  Heparin level 0.3-0.7 units/ml Monitor platelets by anticoagulation protocol: Yes   Plan:  -Start heparin drip at 800 units/hr -1200 HL -Daily CBC/HL -Monitor for bleeding  Narda Bonds 11/30/2017,2:53 AM

## 2017-11-30 NOTE — H&P (Signed)
[facility] H&P  See consult note for full H&P.  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 Biopsies pending CT shows clear trachea Spoke to critical care medicine who will come and assess the patient, later per there APP pt was seen by attending and they feel pt can go to SDU and they will follow along Inhaler regimen per CCM/Pulm  Anticoagulation Patient's anticoagulation was held briefly, a candidate for CTA to rule out PE Given issue such a need for neck CT results, recent biopsy and airway will leave anticoag decision to CCM  Hypercalcemia, mild Corrected calcium on admission 11.3 1 L fluids given by the ED Ordered IV fluids Will need Lasix once airway stabilized Recheck in AM  UTI Rocephin started UCX sent  Elevated lactic acid  Likely due to hypoxia combined with low oral intake and dehydration & UTI We will give IV fluids and monitor Trending down  Hypertension When necessary hydralazine 10 mg IV as needed for severe blood pressure  Afib Hold warfarin Cont flecanide  Hypothyroidism Cont OP synthroid 88 mcg qd No signs of hyper or hypothyroidism  Renal insufficiency Likely due to dehydration and hypoxia Would treat underlying issues and follow labs   Elwin Mocha MD

## 2017-11-30 NOTE — Plan of Care (Signed)
Pt alert and oriented. Pt understand and agree to the plan of care.

## 2017-11-30 NOTE — Progress Notes (Signed)
ANTICOAGULATION CONSULT NOTE - Follow Up Consult  Pharmacy Consult for Heparin (warfarin on hold) Indication: atrial fibrillation  No Known Allergies  Patient Measurements: Height: 5\' 4"  (162.6 cm) Weight: 166 lb 0.1 oz (75.3 kg) IBW/kg (Calculated) : 54.7  Vital Signs: Temp: 97.7 F (36.5 C) (12/09 0208) Temp Source: Oral (12/09 0208) BP: 114/62 (12/09 0413) Pulse Rate: 75 (12/09 0413)  Labs: Recent Labs    11/28/17 0556 11/29/17 1756 11/30/17 0239 11/30/17 1224  HGB 12.1 12.8 11.4*  --   HCT 36.4 38.3 33.8*  --   PLT 258 291 227  --   APTT 38*  --   --   --   LABPROT 21.7* 21.1* 21.9*  --   INR 1.91 1.84 1.93  --   HEPARINUNFRC  --   --   --  0.16*  CREATININE  --  1.06* 0.91  --     Estimated Creatinine Clearance: 49 mL/min (by C-G formula based on SCr of 0.91 mg/dL).   Medical History: Past Medical History:  Diagnosis Date  . Atrial fibrillation ([place])   . Carotid artery disease ([place])   . CHF (congestive heart failure) ([place])   . Hyperlipidemia   . Hypertension   . Hypothyroidism   . Shortness of breath   . TIA (transient ischemic attack) 2001    Assessment: 82 y/o F with rapidly progressing lymphadenopathy of the neck (likely malignancy). Warfarin PTA for afib. Holding warfarin and starting heparin in case any procedures need to be done.  INR was 1.84 on admission, increasing slightly to 1.93 today. First heparin level came back at 0.16 today on 800 units/hr with no bolus. Hgb down slightly to 11.4. INR increased from 1.84 to 1.93. Platelets within normal limits. No issues per heparin infusion. No signs/symptoms of bleeding.   Goal of Therapy:  Heparin level 0.3-0.7 units/ml Monitor platelets by anticoagulation protocol: Yes   Plan:  -Continue to hold warfarin -Increase heparin drip to 1050 units/hr -Obtain 8 hour heparin level -Daily CBC/HL -Monitor for bleeding  Doylene Canard, PharmD Clinical Pharmacist  Pager: [phone] Clinical Phone for  11/30/2017 until 3:30pm: [phone] If after 3:30pm, please call main pharmacy at [phone] 11/30/2017,1:41 PM

## 2017-12-01 LAB — COMPREHENSIVE METABOLIC PANEL
ALBUMIN: 2.9 g/dL — AB (ref 3.5–5.0)
ALK PHOS: 60 U/L (ref 38–126)
ALT: 21 U/L (ref 14–54)
AST: 45 U/L — AB (ref 15–41)
Anion gap: 11 (ref 5–15)
BILIRUBIN TOTAL: 0.5 mg/dL (ref 0.3–1.2)
BUN: 23 mg/dL — AB (ref 6–20)
CALCIUM: 9.2 mg/dL (ref 8.9–10.3)
CO2: 24 mmol/L (ref 22–32)
Chloride: 99 mmol/L — ABNORMAL LOW (ref 101–111)
Creatinine, Ser: 0.8 mg/dL (ref 0.44–1.00)
GFR calc Af Amer: >60 mL/min (ref >60)
GLUCOSE: 79 mg/dL (ref 65–99)
POTASSIUM: 4.2 mmol/L (ref 3.5–5.1)
Sodium: 134 mmol/L — ABNORMAL LOW (ref 135–145)
TOTAL PROTEIN: 5.4 g/dL — AB (ref 6.5–8.1)

## 2017-12-01 LAB — CBC WITH DIFFERENTIAL/PLATELET
BASOS ABS: 0 10*3/uL (ref 0.0–0.1)
BASOS PCT: 0 %
Eosinophils Absolute: 0.1 10*3/uL (ref 0.0–0.7)
Eosinophils Relative: 1 %
HEMATOCRIT: 33 % — AB (ref 36.0–46.0)
HEMOGLOBIN: 11 g/dL — AB (ref 12.0–15.0)
LYMPHS PCT: 14 %
Lymphs Abs: 1 10*3/uL (ref 0.7–4.0)
MCH: 34.2 pg — ABNORMAL HIGH (ref 26.0–34.0)
MCHC: 33.3 g/dL (ref 30.0–36.0)
MCV: 102.5 fL — AB (ref 78.0–100.0)
MONO ABS: 0.7 10*3/uL (ref 0.1–1.0)
MONOS PCT: 10 %
NEUTROS ABS: 5.2 10*3/uL (ref 1.7–7.7)
NEUTROS PCT: 75 %
Platelets: 241 10*3/uL (ref 150–400)
RBC: 3.22 MIL/uL — ABNORMAL LOW (ref 3.87–5.11)
RDW: 14.4 % (ref 11.5–15.5)
WBC: 7 10*3/uL (ref 4.0–10.5)

## 2017-12-01 LAB — MAGNESIUM: MAGNESIUM: 1.8 mg/dL (ref 1.7–2.4)

## 2017-12-01 LAB — HEPARIN LEVEL (UNFRACTIONATED)
Heparin Unfractionated: <0.1 IU/mL — ABNORMAL LOW (ref 0.30–0.70)
Heparin Unfractionated: 0.14 IU/mL — ABNORMAL LOW (ref 0.30–0.70)

## 2017-12-01 LAB — PROTIME-INR
INR: 3.1
Prothrombin Time: 31.7 seconds — ABNORMAL HIGH (ref 11.4–15.2)

## 2017-12-01 LAB — PROCALCITONIN: Procalcitonin: <0.1 ng/mL

## 2017-12-01 LAB — LACTATE DEHYDROGENASE: LDH: 459 U/L — ABNORMAL HIGH (ref 98–192)

## 2017-12-01 MED ORDER — IPRATROPIUM-ALBUTEROL 0.5-2.5 (3) MG/3ML IN SOLN
3.0000 mL | Freq: Three times a day (TID) | RESPIRATORY_TRACT | Status: DC
  Filled 2017-12-01: qty 3

## 2017-12-01 MED ORDER — ALPRAZOLAM 0.25 MG PO TABS
0.2500 mg | ORAL_TABLET | Freq: Once | ORAL | Status: AC
  Administered 2017-12-01: 0.25 mg via ORAL
  Filled 2017-12-01: qty 1

## 2017-12-01 MED ORDER — IPRATROPIUM-ALBUTEROL 0.5-2.5 (3) MG/3ML IN SOLN
3.0000 mL | RESPIRATORY_TRACT | Status: DC | PRN
  Administered 2017-12-08: 3 mL via RESPIRATORY_TRACT
  Filled 2017-12-01: qty 3

## 2017-12-01 NOTE — Progress Notes (Signed)
[place] for Heparin (warfarin on hold) Indication: atrial fibrillation  No Known Allergies  Patient Measurements: Height: 5\' 4"  (162.6 cm) Weight: 166 lb 0.1 oz (75.3 kg) IBW/kg (Calculated) : 54.7  Vital Signs: Temp: 97.9 F (36.6 C) (12/09 2157) Temp Source: Oral (12/09 2157) BP: 163/80 (12/09 2208) Pulse Rate: 94 (12/09 2208)  Labs: Recent Labs    11/28/17 0556 11/29/17 1756 11/30/17 0239 11/30/17 1224 11/30/17 2253  HGB 12.1 12.8 11.4*  --   --   HCT 36.4 38.3 33.8*  --   --   PLT 258 291 227  --   --   APTT 38*  --   --   --   --   LABPROT 21.7* 21.1* 21.9*  --   --   INR 1.91 1.84 1.93  --   --   HEPARINUNFRC  --   --   --  0.16* <0.10*  CREATININE  --  1.06* 0.91  --   --     Estimated Creatinine Clearance: 49 mL/min (by C-G formula based on SCr of 0.91 mg/dL).   Medical History: Past Medical History:  Diagnosis Date  . Atrial fibrillation ([place])   . Carotid artery disease ([place])   . CHF (congestive heart failure) ([place])   . Hyperlipidemia   . Hypertension   . Hypothyroidism   . Shortness of breath   . TIA (transient ischemic attack) 2001    Assessment: 82 y/o F with rapidly progressing lymphadenopathy of the neck (likely malignancy). Warfarin PTA for afib. INR is 1.84. Holding warfarin and starting heparin in case any procedures need to be done. CBC ok.    12/10 AM: Undetectable heparin level due to heparin being off for MRI, heparin just now re-started per RN, will re-time heparin level  Goal of Therapy:  Heparin level 0.3-0.7 units/ml Monitor platelets by anticoagulation protocol: Yes   Plan:  Cont heparin at 1050 units/hr Re-time heparin level for 0800  Narda Bonds 12/01/2017,12:59 AM

## 2017-12-01 NOTE — Progress Notes (Addendum)
[facility] Progress Note  Patient: Colleen [name] GEZ:[id number]   PCP: Ann Held, DO DOB: 1935/02/13   DOA: 11/29/2017   DOS: 12/01/2017   Date of Service: the patient was seen and examined on 12/01/2017  Subjective: arm pain better, no fever no chest pain no shortnes of breath   Brief hospital course: Pt. with PMH of A. fib on Coumadin, CHF, HTN, hypothyroidism, CVA; admitted on 11/29/2017, presented with complaint of right arm pain, was found to have brachial neuritis. Currently further plan is continue current pain management.  Assessment and Plan: 1.  Diffuse lymphadenopathy. Right arm pain.  Suspected brachial neuritis. Aggressive lymphoma??  Presented with complaints of right arm pain ongoing for last 2 days prior to admission. Has noted neck mass ongoing since Thanksgiving.  Seen by PCP.  CT scan chest 11/24/2017 shows lymphadenopathy.  Undergoes FNA C 11/28/2017.  Results currently pending. Comes to hospital with severe right arm pain and shortness of breath. MRI neck shows brachial plexus impingement likely brachial neuritis from lymphadenopathy. Use arm sling, as needed narcotics for pain management for now. Ideal treatment would be steroids although since FNA C are notoriously not diagnostic, patient may require excision biopsy to get further detail about the diagnosis and steroids can mask the diagnosis or delayed. Discussed with oncology, they will discussed with pathology to get the biopsy resulted soon. Oncology Dr. Jana Hakim requested tumor markers to rule out other etiology which is currently ordered.  2.  Concern for airway compromise with hypoxia. Initially presented with right arm pain, had hypoxia with midline shift of the trachea due to bulky lymphadenopathy. There was initial concern for a COPD exacerbation and therefore patient received IV Solu-Medrol in the ER. CCM was consulted, recommended stepdown admission. Patient currently appears to be  hemodynamically stable and in no respiratory distress. Monitor for now.  3.  Chronic A. fib. On Coumadin. Recent biopsy. Antic regulation was on hold, started on IV heparin, now INR is supratherapeutic and therefore heparin is on hold. At present I will not resume Coumadin since the patient may require excisional lymph node biopsy. Currently holding off on that. Continue flecainide.  4.  Mild hypercalcemia. Continue IV fluids. Monitor.  5.  Concern for UTI. Started on Rocephin. Cultures are sent. Doubt UTI for now.  Continue antibiotics for today.  6.  Lactic acidosis. More likely due to the fact the patient has rapidly developing lymphadenopathy rather than hypoperfusion. Monitor.  7.  Essential hypertension. Blood pressure is well controlled. Monitor.  8.  Hypothyroidism. Continue Synthroid.  Diet: cardiac diet DVT Prophylaxis:on therapeutic anticoagulation.  Advance goals of care discussion: full copde  Family Communication: no family was present at bedside, at the time of interview.  Disposition:  Discharge to be determined.   Consultants: PCCM Procedures: none  Antibiotics: Anti-infectives (From admission, onward)   Start     Dose/Rate Route Frequency Ordered Stop   11/30/17 2200  cefTRIAXone (ROCEPHIN) 1 g in dextrose 5 % 50 mL IVPB     1 g 100 mL/hr over 30 Minutes Intravenous Every 24 hours 11/30/17 0523     11/29/17 2300  cefTRIAXone (ROCEPHIN) 1 g in dextrose 5 % 50 mL IVPB     1 g 100 mL/hr over 30 Minutes Intravenous  Once 11/29/17 2258 11/30/17 0006       Objective: Physical Exam: Vitals:   12/01/17 0306 12/01/17 0455 12/01/17 0819 12/01/17 1220  BP:  119/73 124/61 (!) 113/54  Pulse: (!) 125 81  78  Resp: 16 (!) 27 16 18   Temp:  97.8 F (36.6 C) 98.1 F (36.7 C) 97.8 F (36.6 C)  TempSrc:  Oral Oral Oral  SpO2: 97% 96% 97% 99%  Weight:  80.1 kg (176 lb 9.4 oz)    Height:        Intake/Output Summary (Last 24 hours) at 12/01/2017  1725 Last data filed at 12/01/2017 1219 Gross per 24 hour  Intake 2583.13 ml  Output 900 ml  Net 1683.13 ml   Filed Weights   11/29/17 1756 11/30/17 0208 12/01/17 0455  Weight: 74.4 kg (164 lb) 75.3 kg (166 lb 0.1 oz) 80.1 kg (176 lb 9.4 oz)   General: Alert, Awake and Oriented to Time, Place and Person. Appear in moderate distress, affect appropriate Eyes: PERRL, Conjunctiva normal ENT: Oral Mucosa clear moist. Neck: difficult to assess JVD, no Abnormal Mass Or lumps Cardiovascular: S1 and S2 Present, no Murmur, Peripheral Pulses Present Respiratory: normal respiratory effort, Bilateral Air entry equal and Decreased, no use of accessory muscle, Clear to Auscultation, no Crackles, no wheezes Abdomen: Bowel Sound present, Soft and no tenderness, no hernia Skin: no redness, no Rash, no induration Extremities: no Pedal edema, no calf tenderness Neurologic: Grossly no focal neuro deficit. Bilaterally Equal motor strength  Data Reviewed: CBC: Recent Labs  Lab 11/28/17 0556 11/29/17 1756 11/30/17 0239 12/01/17 0700  WBC 5.4 7.7 4.6 7.0  NEUTROABS  --  6.1  --  5.2  HGB 12.1 12.8 11.4* 11.0*  HCT 36.4 38.3 33.8* 33.0*  MCV 101.7* 103.0* 102.1* 102.5*  PLT 258 291 227 185   Basic Metabolic Panel: Recent Labs  Lab 11/29/17 1756 11/30/17 0239 12/01/17 0700  NA 133* 137 134*  K 4.3 4.9 4.2  CL 98* 99* 99*  CO2 20* 23 24  GLUCOSE 101* 113* 79  BUN 14 15 23*  CREATININE 1.06* 0.91 0.80  CALCIUM 10.8* 10.3 9.2  MG  --  1.4* 1.8  PHOS  --  4.3  --     Liver Function Tests: Recent Labs  Lab 11/29/17 1756 12/01/17 0700  AST 53* 45*  ALT 22 21  ALKPHOS 74 60  BILITOT 0.6 0.5  PROT 6.4* 5.4*  ALBUMIN 3.4* 2.9*   No results for input(s): LIPASE, AMYLASE in the last 168 hours. No results for input(s): AMMONIA in the last 168 hours. Coagulation Profile: Recent Labs  Lab 11/28/17 0556 11/29/17 1756 11/30/17 0239 12/01/17 0527  INR 1.91 1.84 1.93 3.10   Cardiac  Enzymes: No results for input(s): CKTOTAL, CKMB, CKMBINDEX, TROPONINI in the last 168 hours. BNP (last 3 results) No results for input(s): PROBNP in the last 8760 hours. CBG: No results for input(s): GLUCAP in the last 168 hours. Studies: Mr Neck Soft Tissue Only W Wo Contrast  Result Date: 11/30/2017 CLINICAL DATA:  Rapidly progressive lymphadenopathy, RIGHT arm swelling since Thanksgiving. Suspected lymphoma. Status post biopsy, results pending. EXAM: MRI OF THE NECK WITH CONTRAST TECHNIQUE: Multiplanar, multisequence MR imaging was performed following the administration of intravenous contrast. CONTRAST:  16mL MULTIHANCE GADOBENATE DIMEGLUMINE 529 MG/ML IV SOLN COMPARISON:  CT neck November 29, 2017 and CT neck November 24, 2017 FINDINGS: Moderately motion degraded examination. PHARYNX AND LARYNX: Normal.  Widely patent airway. SALIVARY GLANDS: Intraparotid lymph nodes bilaterally measuring to 17 mm in deep lobe. No definite primary parotid mass though limited by motion. THYROID: Normal. LYMPH NODES: Severe RIGHT greater than LEFT cervical lymphadenopathy with intermediate to bright T2 signal and, mild enhancement. Lymphadenopathy  all levels of the neck, at least 8.4 x 4.2 cm RIGHT supraclavicular nodal conglomeration. Lymphadenopathy abuts the scalene muscles impinges upon the RIGHT brachials plexus. Limited assessment of the brachium plexus due to motion. Massive LEFT retropectoral lymphadenopathy, lesser extent on the RIGHT. VASCULAR: Normal flow-voids. LIMITED INTRACRANIAL: Normal. VISUALIZED ORBITS: Normal. MASTOIDS AND VISUALIZED PARANASAL SINUSES: Well-aerated. SKELETON: No abnormal bone marrow signal though not tailored for evaluation. Limited assessment of spinal cord due to motion, no suspicious intracanalicular enhancement. UPPER CHEST: Lung apices are clear. No superior mediastinal lymphadenopathy. OTHER: Susceptibility artifact LEFT neck corresponding to use surgical clips. IMPRESSION: 1.  Moderately motion degraded examination. 2. Bulky cervical and included chest lymphadenopathy with RIGHT brachial plexus impingement. Electronically Signed   By: Elon Alas M.D.   On: 11/30/2017 22:16    Scheduled Meds: . arformoterol  15 mcg Nebulization BID  . budesonide (PULMICORT) nebulizer solution  0.25 mg Nebulization BID  . flecainide  50 mg Oral BID  . levothyroxine  88 mcg Oral QAC breakfast  . polyethylene glycol  17 g Oral Daily  . senna-docusate  1 tablet Oral QHS   Continuous Infusions: . sodium chloride 100 mL/hr at 12/01/17 0340  . cefTRIAXone (ROCEPHIN)  IV Stopped (12/01/17 0036)   PRN Meds: acetaminophen **OR** acetaminophen, hydrALAZINE, HYDROmorphone (DILAUDID) injection, ipratropium-albuterol, ondansetron **OR** ondansetron (ZOFRAN) IV, oxyCODONE  Time spent: 35 minutes  Author: Berle Mull, MD [facility] Pager: [phone] 12/01/2017 5:25 PM  If 7PM-7AM, please contact night-coverage at [email or web address], password [facility]

## 2017-12-01 NOTE — Progress Notes (Signed)
Name: Colleen [name] MRN: [id number] DOB: 04-16-1935    ADMISSION DATE:  11/29/2017 CONSULTATION DATE:  11/29/2017  REFERRING MD :  Dr. Aggie Moats  CHIEF COMPLAINT:  Hypoxia   HISTORY OF PRESENT ILLNESS:  82 year old female smoker (started when she was 17 and stopped 15 years ago) with PMH of A.Fib (on coumadin), CAD, Chronic Back pain, HLD, HTN, pHTN  Presents to ED on 12/8 with progressive right shoulder pain. Patient states that since Thanksgiving she has noticed swelling around her face and neck. Went to primary care office, was sent for CT which showed severe right and mild left neck lymphadenopathy, with severe left axillary lymphadenopathy with lymph edema vs lymphangitic spread of tumor and left retropectoral infiltrative mass. Sent for Biopsy per IR on 12/7. Upon arrival to ED patient denies shortness of breath however oxygen saturation is 88-90% on room air. LA 7.9, U/A with many bacteria. CT Chest with Emphysematous. CT Neck with progression of lymphadenopathy. PCCM asked to consult regarding hypoxia and concern of airway.   SIGNIFICANT EVENTS  12/8 > Presents to ED   STUDIES:  CXR 12/8 > Changes consistent with the known left upper lobe mass lesion as well as apparent hilar adenopathy particularly on the right. CT of the chest is recommended for further evaluation as previously described. CT Chest W 12/8 > Diffuse lymphadenopathy involving the supraclavicular regions, mediastinum, bilateral hila as well as the axillary regions bilaterally as described above. Correlation with the recent biopsy results is recommended. It would be difficult to exclude a portion of the abnormality in the left chest wall to be related to the overlying breast given the degree of skin thickening and edema identified inferiorly. Alternatively this may represent an aggressive lymphoma. Further workup with PET-CT is recommended.  T9 compression deformity with some lucencies noted within. This may be  strictly related to the compression deformity although the possibility of metastatic disease would deserve consideration as well. No other definitive bony abnormality is noted CT Neck 12/8 > Interval progression of lymphadenopathy from 11/24/2017 best appreciated in the left cervical level 5B station, but measurable increase is present diffusely. Findings indicate rapidly progressive lymphoproliferative or metastatic disease. Correlation with biopsy is recommended.   SUBJECTIVE:  Feels better VITAL SIGNS: Temp:  [97.8 F (36.6 C)-98.1 F (36.7 C)] 98.1 F (36.7 C) (12/10 0819) Pulse Rate:  [81-125] 81 (12/10 0455) Resp:  [12-27] 16 (12/10 0819) BP: (119-163)/(61-86) 124/61 (12/10 0819) SpO2:  [93 %-97 %] 97 % (12/10 0819) Weight:  [176 lb 9.4 oz (80.1 kg)] 176 lb 9.4 oz (80.1 kg) (12/10 0455)  PHYSICAL EXAMINATION: General: Elderly female patient lying in bed she is in no acute distress but does have acute discomfort anytime she is moved HEENT: Substantial mass involving the right neck extending up into the right maxillary region, and mucous membranes are moist sclera nonicteric Pulmonary: Clear to auscultation without wheeze no accessory muscle use. Cardiac: Regular rate and rhythm without murmur rub or gallop. Extremities/muscular skeletal: Warm and dry, extensive adenopathy involving the right axilla painful to touch. Recent Labs  Lab 11/29/17 1756 11/30/17 0239 12/01/17 0700  NA 133* 137 134*  K 4.3 4.9 4.2  CL 98* 99* 99*  CO2 20* 23 24  BUN 14 15 23*  CREATININE 1.06* 0.91 0.80  GLUCOSE 101* 113* 79   Recent Labs  Lab 11/29/17 1756 11/30/17 0239 12/01/17 0700  HGB 12.8 11.4* 11.0*  HCT 38.3 33.8* 33.0*  WBC 7.7 4.6 7.0  PLT  291 227 241   Ct Soft Tissue Neck Wo Contrast  Result Date: 11/29/2017 CLINICAL DATA:  82 y/o F; patient undergoing evaluation for lymphadenopathy with biopsy in the left subclavian area straightening, now presenting with swelling of the  left arm and chest with associated severe pain. EXAM: CT NECK WITHOUT CONTRAST TECHNIQUE: Multidetector CT imaging of the neck was performed following the standard protocol without intravenous contrast. COMPARISON:  11/24/2017 CT of the neck. FINDINGS: Pharynx and larynx: Normal. No mass or swelling. Salivary glands: No inflammation, mass, or stone. Thyroid: Normal. Lymph nodes: Interval progression of diffuse lymphadenopathy, for example a left level 5B lymph node measures 12 x 12 mm, previously 6 mm (series 3, image 88). Increased lymphadenopathy is best appreciated left posterior cervical chain and left upper axilla but increase in adenopathy is present diffusely, for example a right upper peritracheal node measuring 24 x 31 mm, previously 17 x 23 mm (series 3, image 121). Fat stranding surrounding conglomerate lymph nodes throughout the right cervical chain, right greater than left supraclavicular regions, and left axilla may represent infiltrative neoplasm or lymphedema. Vascular: Calcific atherosclerosis of the aorta and carotid siphons. Limited intracranial: Negative. Visualized orbits: Negative. Mastoids and visualized paranasal sinuses: Small left maxillary sinus mucous retention cyst. Otherwise negative. Skeleton: Stable mild cervical spondylosis. No high-grade bony canal stenosis. Upper chest: Emphysema and scarring in bilateral upper lobes is stable. Other: None. IMPRESSION: Interval progression of lymphadenopathy from 11/24/2017 best appreciated in the left cervical level 5B station, but measurable increase is present diffusely. Findings indicate rapidly progressive lymphoproliferative or metastatic disease. Correlation with biopsy is recommended. Electronically Signed   By: Kristine Garbe M.D.   On: 11/29/2017 22:54   Ct Chest W Contrast  Result Date: 11/29/2017 CLINICAL DATA:  Left-sided chest pain and swelling, recent biopsy on right supraclavicular lymph nodes EXAM: CT CHEST WITH  CONTRAST TECHNIQUE: Multidetector CT imaging of the chest was performed during intravenous contrast administration. CONTRAST:  23mL ISOVUE-300 IOPAMIDOL (ISOVUE-300) INJECTION 61% COMPARISON:  11/24/2017 FINDINGS: Cardiovascular: Atherosclerotic changes of the thoracic aorta are noted without aneurysmal dilatation or dissection. Coronary calcifications are seen. No pulmonary emboli are noted. Some attenuation of the pulmonary arterial branches is noted secondary to hilar adenopathy. Additionally some displacement of the vasculature in the neck is noted particularly on the right also related to adenopathy. Mediastinum/Nodes: Thoracic inlet demonstrates evidence of significant right supraclavicular lymphadenopathy. The largest nodal mass measures approximately 10 by 4.9 cm and causes displacement of the adjacent vascular structures in the right neck. Additional adenopathy is noted in the anterior neck similar to that seen on prior examination. Significant mediastinal and hilar adenopathy is identified. The largest of these in the right hilum measures 2.3 cm in short axis. Subcarinal lymphadenopathy is noted measuring 16 mm in short axis. Prevascular adenopathy is noted measuring 2.2 cm in short axis. The esophagus is within normal limits. Considerable adenopathy is noted in the posterior mediastinum surrounding the descending aorta. The nodal mass measures approximately 2.9 by 3.9 cm. Considerable right axillary adenopathy is noted extending along the lateral chest wall. Lungs/Pleura: Emphysematous changes are noted in the lungs bilaterally. Mild scarring is seen. No focal parenchymal mass is noted. No sizable effusion is seen. Upper Abdomen: Gallbladder has been surgically removed. The liver is within normal limits. The adrenal glands and visualized spleen are unremarkable. Musculoskeletal: T9 compression deformity is noted. Some lucency is noted within. It would be difficult to exclude some metastatic involvement  given the findings throughout the chest.  Degenerative changes of the thoracic spine are seen. No rib abnormality is noted. Considerable soft tissue mass is noted in the left anterior chest wall involving the pectoral muscles and subpectoral region. The overall size of the mass measures approximately 10.5 by 6.7 cm and Ing gallstone the subclavian and axillary artery is and veins. Significant skin thickening and edema is noted within the left breast. It would be difficult to exclude a portion of this representing a breast mass. There is apparent involvement of the chest wall although no bony destruction is seen. IMPRESSION: Diffuse lymphadenopathy involving the supraclavicular regions, mediastinum, bilateral hila as well as the axillary regions bilaterally as described above. Correlation with the recent biopsy results is recommended. It would be difficult to exclude a portion of the abnormality in the left chest wall to be related to the overlying breast given the degree of skin thickening and edema identified inferiorly. Alternatively this may represent an aggressive lymphoma. Further workup with PET-CT is recommended. T9 compression deformity with some lucencies noted within. This may be strictly related to the compression deformity although the possibility of metastatic disease would deserve consideration as well. No other definitive bony abnormality is noted. Aortic Atherosclerosis (ICD10-I70.0) and Emphysema (ICD10-J43.9). Electronically Signed   By: Inez Catalina M.D.   On: 11/29/2017 20:01   Mr Neck Soft Tissue Only W Wo Contrast  Result Date: 11/30/2017 CLINICAL DATA:  Rapidly progressive lymphadenopathy, RIGHT arm swelling since Thanksgiving. Suspected lymphoma. Status post biopsy, results pending. EXAM: MRI OF THE NECK WITH CONTRAST TECHNIQUE: Multiplanar, multisequence MR imaging was performed following the administration of intravenous contrast. CONTRAST:  30mL MULTIHANCE GADOBENATE DIMEGLUMINE 529  MG/ML IV SOLN COMPARISON:  CT neck November 29, 2017 and CT neck November 24, 2017 FINDINGS: Moderately motion degraded examination. PHARYNX AND LARYNX: Normal.  Widely patent airway. SALIVARY GLANDS: Intraparotid lymph nodes bilaterally measuring to 17 mm in deep lobe. No definite primary parotid mass though limited by motion. THYROID: Normal. LYMPH NODES: Severe RIGHT greater than LEFT cervical lymphadenopathy with intermediate to bright T2 signal and, mild enhancement. Lymphadenopathy all levels of the neck, at least 8.4 x 4.2 cm RIGHT supraclavicular nodal conglomeration. Lymphadenopathy abuts the scalene muscles impinges upon the RIGHT brachials plexus. Limited assessment of the brachium plexus due to motion. Massive LEFT retropectoral lymphadenopathy, lesser extent on the RIGHT. VASCULAR: Normal flow-voids. LIMITED INTRACRANIAL: Normal. VISUALIZED ORBITS: Normal. MASTOIDS AND VISUALIZED PARANASAL SINUSES: Well-aerated. SKELETON: No abnormal bone marrow signal though not tailored for evaluation. Limited assessment of spinal cord due to motion, no suspicious intracanalicular enhancement. UPPER CHEST: Lung apices are clear. No superior mediastinal lymphadenopathy. OTHER: Susceptibility artifact LEFT neck corresponding to use surgical clips. IMPRESSION: 1. Moderately motion degraded examination. 2. Bulky cervical and included chest lymphadenopathy with RIGHT brachial plexus impingement. Electronically Signed   By: Elon Alas M.D.   On: 11/30/2017 22:16   Dg Chest Portable 1 View  Result Date: 11/29/2017 CLINICAL DATA:  Abnormal lymphadenopathy in the neck, chest swelling, initial encounter EXAM: PORTABLE CHEST 1 VIEW COMPARISON:  11/24/2017 FINDINGS: Cardiac shadow is within normal limits. Soft tissue mass lesion is again noted superimposed over the left hilum similar to that seen on recent CT of the neck. Fullness in the hilar regions is noted bilaterally likely representing some adenopathy. Aortic  calcifications are seen. The lungs are clear. No bony abnormality is noted. IMPRESSION: Changes consistent with the known left upper lobe mass lesion as well as apparent hilar adenopathy particularly on the right. CT of the chest  is recommended for further evaluation as previously described. Electronically Signed   By: Inez Catalina M.D.   On: 11/29/2017 19:10    ASSESSMENT / PLAN:  Acute Hypoxic Respiratory Failure  H/O Tobacco Use Lactic Acidosis in setting of hypoxia vs UTI   Rapidly Progressing Right neck malignancy  UTI H/O AFib on Coumadin   Discussion Pulmonary asked to see in regards to acute lactic acidosis.  This is felt to be due in the setting of sepsis from urinary tract infection but also possibly hypoxia (sats 88%) however she denied shortness of breath. favor UTI and sepsis.  Her lactic acid has subsequently cleared as would expected with IV fluids.  She is now on day #3 ceftriaxone.  Of note she also has substantial lymphadenopathy involving the neck as well as mediastinum and hila as well as axillary regions.  At this point there is no airway compromise.  She had an ultrasound-guided biopsy over lymph nodes completed on 12/4.  Pathology has yet to be completed and apparently is being set up for another biopsy by internal medicine  Plan/rec. Continue current therapy as outlined including IV hydration and antibiotics Follow-up culture data Follow-up pathology  We will sign off, no further role for critical care  Erick Colace ACNP-BC [place] Pager # [phone] OR # [phone] if no answer

## 2017-12-01 NOTE — Progress Notes (Signed)
[place] for Heparin (warfarin on hold) Indication: atrial fibrillation  No Known Allergies  Patient Measurements: Height: 5\' 4"  (162.6 cm) Weight: 176 lb 9.4 oz (80.1 kg) IBW/kg (Calculated) : 54.7  Vital Signs: Temp: 97.8 F (36.6 C) (12/10 0455) Temp Source: Oral (12/10 0455) BP: 119/73 (12/10 0455) Pulse Rate: 81 (12/10 0455)  Labs: Recent Labs    11/29/17 1756 11/30/17 0239 11/30/17 1224 11/30/17 2253 12/01/17 0527  HGB 12.8 11.4*  --   --   --   HCT 38.3 33.8*  --   --   --   PLT 291 227  --   --   --   LABPROT 21.1* 21.9*  --   --  31.7*  INR 1.84 1.93  --   --  3.10  HEPARINUNFRC  --   --  0.16* <0.10* 0.14*  CREATININE 1.06* 0.91  --   --   --     Estimated Creatinine Clearance: 50.5 mL/min (by C-G formula based on SCr of 0.91 mg/dL).   Medical History: Past Medical History:  Diagnosis Date  . Atrial fibrillation ([place])   . Carotid artery disease ([place])   . CHF (congestive heart failure) ([place])   . Hyperlipidemia   . Hypertension   . Hypothyroidism   . Shortness of breath   . TIA (transient ischemic attack) 2001    Assessment: 82 y/o F with rapidly progressing lymphadenopathy of the neck (likely malignancy). Warfarin PTA for afib.   We were using heparin while INR was <2 but now the INR has trended up to 3.1  Goal of Therapy:  Heparin level 0.3-0.7 units/ml Monitor platelets by anticoagulation protocol: Yes   Plan:  -Hold heparin  -Daily PT/INR -Resume heparin when INR <2  Narda Bonds 12/01/2017,6:11 AM

## 2017-12-01 NOTE — Progress Notes (Signed)
Orthopedic Tech Progress Note Patient Details:  Colleen [name] [birth year] [id number]  Ortho Devices Type of Ortho Device: Arm sling Ortho Device/Splint Location: RUE Ortho Device/Splint Interventions: Ordered, Application   Post Interventions Patient Tolerated: Well Instructions Provided: Care of device   Braulio Bosch 12/01/2017, 1:03 PM

## 2017-12-01 NOTE — Progress Notes (Signed)
[facility] PCCM  Seen by our service overnight Feel better Now eating more Continue fluids, antibiotics  PCCM will sign off  Roselie Awkward, MD [place] [facility] Pager: [phone] Cell: [phone] After 3pm or if no response, call [phone]

## 2017-12-02 ENCOUNTER — Inpatient Hospital Stay (HOSPITAL_COMMUNITY): Payer: Medicare Other

## 2017-12-02 ENCOUNTER — Other Ambulatory Visit: Payer: Self-pay | Admitting: Oncology

## 2017-12-02 DIAGNOSIS — Z7901 Long term (current) use of anticoagulants: Secondary | ICD-10-CM

## 2017-12-02 DIAGNOSIS — I480 Paroxysmal atrial fibrillation: Secondary | ICD-10-CM

## 2017-12-02 DIAGNOSIS — M79621 Pain in right upper arm: Secondary | ICD-10-CM

## 2017-12-02 LAB — PROTIME-INR
INR: 1.76
PROTHROMBIN TIME: 20.4 s — AB (ref 11.4–15.2)

## 2017-12-02 LAB — PROCALCITONIN

## 2017-12-02 LAB — HEPARIN LEVEL (UNFRACTIONATED): Heparin Unfractionated: <0.1 IU/mL — ABNORMAL LOW (ref 0.30–0.70)

## 2017-12-02 LAB — CALCIUM, IONIZED: CALCIUM, IONIZED, SERUM: 5.7 mg/dL — AB (ref 4.5–5.6)

## 2017-12-02 MED ORDER — LIDOCAINE HCL (PF) 1 % IJ SOLN
INTRAMUSCULAR | Status: AC
  Filled 2017-12-02: qty 30

## 2017-12-02 MED ORDER — MIDAZOLAM HCL 2 MG/2ML IJ SOLN
INTRAMUSCULAR | Status: AC | PRN
  Administered 2017-12-02 (×2): 1 mg via INTRAVENOUS

## 2017-12-02 MED ORDER — HEPARIN (PORCINE) IN NACL 100-0.45 UNIT/ML-% IJ SOLN
1250.0000 [IU]/h | INTRAMUSCULAR | Status: DC
  Administered 2017-12-02: 1050 [IU]/h via INTRAVENOUS
  Administered 2017-12-03 – 2017-12-07 (×3): 1150 [IU]/h via INTRAVENOUS
  Filled 2017-12-02 (×6): qty 250

## 2017-12-02 MED ORDER — MIDAZOLAM HCL 2 MG/2ML IJ SOLN
INTRAMUSCULAR | Status: AC
  Filled 2017-12-02: qty 2

## 2017-12-02 MED ORDER — SODIUM CHLORIDE 0.9 % IV SOLN
INTRAVENOUS | Status: AC | PRN
  Administered 2017-12-02: 30 mL/h via INTRAVENOUS

## 2017-12-02 MED ORDER — FENTANYL CITRATE (PF) 100 MCG/2ML IJ SOLN
INTRAMUSCULAR | Status: AC | PRN
  Administered 2017-12-02: 50 ug via INTRAVENOUS

## 2017-12-02 MED ORDER — FENTANYL CITRATE (PF) 100 MCG/2ML IJ SOLN
INTRAMUSCULAR | Status: AC
  Filled 2017-12-02: qty 2

## 2017-12-02 NOTE — Consult Note (Signed)
[place]  [phone] [phone]     ID: Colleen [name] DOB: [birth year]  MR#: [id number]  PRX#:[id number]  Patient Care Team: Carollee Herter, Alferd Apa, DO as PCP - General Chauncey Cruel, MD OTHER MD:  CHIEF COMPLAINT: rapidly progressive adenopathy  CURRENT TREATMENT: workup in progress   HISTORY OF CURRENT ILLNESS: The patient tells me she was in her usual state of health until Thanksgivings, when she noted a mass in her R neck. She brought it to medical attention and after some delays she had a CT of the neck 11/24/2017 showing significant adenopathy bilaterally (neck, intraparotid, medistinal, axillary). Biopsy of a R [place] LN 11/28/2017 is still pending.  Subsequently the patient developed significant R shoulder/arm pain and was admitted 11/29/2017 with a chest CT same day confirming massive adenopathy as described below, as well as a L anterior chest wall mass measuring up to 10.5 cm. There was no obvious lung mass or effusion.  The patient's subsequent history is as detailed below.  INTERVAL HISTORY: I met with Colleen [name] in her hospital room 12/02/2018 with her nurse present.  REVIEW OF SYSTEMS: She denies significant weight loss, fatigue, fever, drenching sweats; no difficulty swallowing; no cough; she has significant R UE pain with minimal motion; a detailed ROS today was other wise noncontributory  PAST MEDICAL HISTORY: Past Medical History:  Diagnosis Date  . Atrial fibrillation ([place])   . Carotid artery disease ([place])   . CHF (congestive heart failure) ([place])   . Hyperlipidemia   . Hypertension   . Hypothyroidism   . Shortness of breath   . TIA (transient ischemic attack) 2001    PAST SURGICAL HISTORY: Past Surgical History:  Procedure Laterality Date  . ABDOMINAL HYSTERECTOMY  1975   TAH,BSO  . CARDIAC CATHETERIZATION N/A 08/14/2015   Procedure: Right Heart Cath;  Surgeon: Larey Dresser, MD;  Location: [facility];   Service: Cardiovascular;  Laterality: N/A;  . carotid duplex  11/24/2012  . carotid duplex  11/01/2010  . CAROTID ENDARTERECTOMY  2001   left  . CHOLECYSTECTOMY    . DOPPLER ECHOCARDIOGRAPHY  02/13/2010   EF>55%  . DOPPLER ECHOCARDIOGRAPHY  09/29/2007  . NM MYOVIEW LTD  02/13/2010  . renal duplex  02/13/2010  . VIDEO BRONCHOSCOPY Bilateral 08/09/2015   Procedure: VIDEO BRONCHOSCOPY WITHOUT FLUORO;  Surgeon: Collene Gobble, MD;  Location: [facility];  Service: Cardiopulmonary;  Laterality: Bilateral;    FAMILY HISTORY Family History  Problem Relation Age of Onset  . Heart failure Mother   . Parkinson's disease Mother   . Arthritis Mother   . Arthritis Sister   . Colon cancer Brother   . Rheumatologic disease Neg Hx   . Lung disease Neg Hx     GYNECOLOGIC HISTORY:  No LMP recorded. Patient is not currently having periods (Reason: Other).   SOCIAL HISTORY:  She is a retired Network engineer; her husband worked in Press photographer, now retired works part-time driving cars for dealers. The patient has a daughter who runs her own cleaning business and a son in Architect, both in this area. The patient has 5 grandchildre, 9 great-grandchildren and a great-great on the way. She attends a [employer]    ADVANCED DIRECTIVES:    HEALTH MAINTENANCE: Social History   Tobacco Use  . Smoking status: Former Smoker    Packs/day: 1.00    Years: 47.00    Pack years: 47.00    Types: Cigarettes    Last attempt  to quit: 12/24/1999    Years since quitting: 17.9  . Smokeless tobacco: Never Used  Substance Use Topics  . Alcohol use: Yes    Alcohol/week: 0.0 oz    Comment: 1 glass of wine rarely  . Drug use: No     Colonoscopy:  PAP:  Bone density:  Mammogram: "not in several years"   No Known Allergies  Current Facility-Administered Medications  Medication Dose Route Frequency Provider Last Rate Last Dose  . 0.9 %  sodium chloride infusion   Intravenous Continuous Elwin Mocha, MD  100 mL/hr at 12/02/17 1694    . acetaminophen (TYLENOL) tablet 650 mg  650 mg Oral Q6H PRN Elwin Mocha, MD       Or  . acetaminophen (TYLENOL) suppository 650 mg  650 mg Rectal Q6H PRN Elwin Mocha, MD      . arformoterol [facility]) nebulizer solution 15 mcg  15 mcg Nebulization BID Omar Person, NP   15 mcg at 12/01/17 2105  . budesonide (PULMICORT) nebulizer solution 0.25 mg  0.25 mg Nebulization BID Hayden Pedro M, NP   0.25 mg at 12/01/17 2105  . cefTRIAXone (ROCEPHIN) 1 g in dextrose 5 % 50 mL IVPB  1 g Intravenous Q24H Omar Person, NP   Stopped at 12/01/17 2311  . flecainide (TAMBOCOR) tablet 50 mg  50 mg Oral BID Elwin Mocha, MD   50 mg at 12/01/17 2156  . heparin ADULT infusion 100 units/mL (25000 units/240m sodium chloride 0.45%)  1,050 Units/hr Intravenous Continuous MKris Mouton RPH      . hydrALAZINE (APRESOLINE) injection 10 mg  10 mg Intravenous Q8H PRN HElwin Mocha MD      . HYDROmorphone (DILAUDID) injection 0.5-1 mg  0.5-1 mg Intravenous Q3H PRN PLavina Hamman MD   1 mg at 12/01/17 0001  . ipratropium-albuterol (DUONEB) 0.5-2.5 (3) MG/3ML nebulizer solution 3 mL  3 mL Nebulization Q4H PRN PLavina Hamman MD      . levothyroxine (SYNTHROID, LEVOTHROID) tablet 88 mcg  88 mcg Oral QAC breakfast HElwin Mocha MD   88 mcg at 12/02/17 0539  . ondansetron (ZOFRAN) tablet 4 mg  4 mg Oral Q6H PRN HElwin Mocha MD       Or  . ondansetron ([facility] injection 4 mg  4 mg Intravenous Q6H PRN HElwin Mocha MD      . oxyCODONE (Oxy IR/ROXICODONE) immediate release tablet 5 mg  5 mg Oral Q4H PRN PLavina Hamman MD   5 mg at 12/01/17 2155  . polyethylene glycol (MIRALAX / GLYCOLAX) packet 17 g  17 g Oral Daily PLavina Hamman MD      . senna-docusate (Senokot-S) tablet 1 tablet  1 tablet Oral QHS PLavina Hamman MD        OBJECTIVE: older White woman examined in bed  Vitals:   12/01/17 2339 12/02/17 0324  BP: (!) 116/57 (!) 142/69    Pulse: 82 88  Resp: 17 18  Temp: 98.7 F (37.1 C) 98.4 F (36.9 C)  SpO2: 95% 98%     Body mass index is 32.01 kg/m.   Wt Readings from Last 3 Encounters:  12/02/17 186 lb 8.2 oz (84.6 kg)  11/28/17 161 lb (73 kg)  11/14/17 164 lb (74.4 kg)      Ocular: Sclerae unicteric, pupils round and equal Ear-nose-throat: Oropharynx clear Lymphatic: large bilateral cervical and [place] adenopathy, large fixed L axillary adenopathy, could not assess R  axilla due to pain, no inguinal adenopathy Lungs no rales or rhonchi, auscultated anterolaterally Heart regular rate and rhythm Abd soft, nontender, positive bowel sounds Neuro: non-focal, well-oriented, appropriate affect Breasts: no masses palpable in either breast, no skin or nipple changes   LAB RESULTS:  CMP     Component Value Date/Time   NA 134 (L) 12/01/2017 0700   K 4.2 12/01/2017 0700   CL 99 (L) 12/01/2017 0700   CO2 24 12/01/2017 0700   GLUCOSE 79 12/01/2017 0700   BUN 23 (H) 12/01/2017 0700   CREATININE 0.80 12/01/2017 0700   CALCIUM 9.2 12/01/2017 0700   PROT 5.4 (L) 12/01/2017 0700   ALBUMIN 2.9 (L) 12/01/2017 0700   AST 45 (H) 12/01/2017 0700   ALT 21 12/01/2017 0700   ALKPHOS 60 12/01/2017 0700   BILITOT 0.5 12/01/2017 0700   GFRNONAA >60 12/01/2017 0700   GFRAA >60 12/01/2017 0700    No results found for: TOTALPROTELP, ALBUMINELP, A1GS, A2GS, BETS, BETA2SER, GAMS, MSPIKE, SPEI  No results found for: KPAFRELGTCHN, LAMBDASER, KAPLAMBRATIO  Lab Results  Component Value Date   WBC 7.0 12/01/2017   NEUTROABS 5.2 12/01/2017   HGB 11.0 (L) 12/01/2017   HCT 33.0 (L) 12/01/2017   MCV 102.5 (H) 12/01/2017   PLT 241 12/01/2017    '@LASTCHEMISTRY' @  No results found for: LABCA2  No components found for: [id number]  Recent Labs  Lab 12/02/17 0531  INR 1.76    No results found for: LABCA2  No results found for: YQM578  No results found for: ION629  No results found for: BMW413  No results found for:  CA2729  No components found for: HGQUANT  No results found for: CEA1 / No results found for: CEA1   No results found for: AFPTUMOR  No results found for: CHROMOGRNA  No results found for: PSA1  Admission on 11/29/2017  Component Date Value Ref Range Status  . Lactic Acid, Venous 11/29/2017 7.91* 0.5 - 1.9 mmol/L Final  . Comment 11/29/2017 NOTIFIED PHYSICIAN   Final  . Sodium 11/29/2017 133* 135 - 145 mmol/L Final  . Potassium 11/29/2017 4.3  3.5 - 5.1 mmol/L Final  . Chloride 11/29/2017 98* 101 - 111 mmol/L Final  . CO2 11/29/2017 20* 22 - 32 mmol/L Final  . Glucose, Bld 11/29/2017 101* 65 - 99 mg/dL Final  . BUN 11/29/2017 14  6 - 20 mg/dL Final  . Creatinine, Ser 11/29/2017 1.06* 0.44 - 1.00 mg/dL Final  . Calcium 11/29/2017 10.8* 8.9 - 10.3 mg/dL Final  . Total Protein 11/29/2017 6.4* 6.5 - 8.1 g/dL Final  . Albumin 11/29/2017 3.4* 3.5 - 5.0 g/dL Final  . AST 11/29/2017 53* 15 - 41 U/L Final  . ALT 11/29/2017 22  14 - 54 U/L Final  . Alkaline Phosphatase 11/29/2017 74  38 - 126 U/L Final  . Total Bilirubin 11/29/2017 0.6  0.3 - 1.2 mg/dL Final  . GFR calc non Af Amer 11/29/2017 48* >60 mL/min Final  . GFR calc Af Amer 11/29/2017 56* >60 mL/min Final   Comment: (NOTE) The eGFR has been calculated using the CKD EPI equation. This calculation has not been validated in all clinical situations. eGFR's persistently <60 mL/min signify possible Chronic Kidney Disease.   . Anion gap 11/29/2017 15  5 - 15 Final  . WBC 11/29/2017 7.7  4.0 - 10.5 K/uL Final  . RBC 11/29/2017 3.72* 3.87 - 5.11 MIL/uL Final  . Hemoglobin 11/29/2017 12.8  12.0 - 15.0 g/dL Final  .  HCT 11/29/2017 38.3  36.0 - 46.0 % Final  . MCV 11/29/2017 103.0* 78.0 - 100.0 fL Final  . MCH 11/29/2017 34.4* 26.0 - 34.0 pg Final  . MCHC 11/29/2017 33.4  30.0 - 36.0 g/dL Final  . RDW 11/29/2017 14.7  11.5 - 15.5 % Final  . Platelets 11/29/2017 291  150 - 400 K/uL Final  . Neutrophils Relative % 11/29/2017 80  %  Final  . Neutro Abs 11/29/2017 6.1  1.7 - 7.7 K/uL Final  . Lymphocytes Relative 11/29/2017 11  % Final  . Lymphs Abs 11/29/2017 0.9  0.7 - 4.0 K/uL Final  . Monocytes Relative 11/29/2017 9  % Final  . Monocytes Absolute 11/29/2017 0.7  0.1 - 1.0 K/uL Final  . Eosinophils Relative 11/29/2017 0  % Final  . Eosinophils Absolute 11/29/2017 0.0  0.0 - 0.7 K/uL Final  . Basophils Relative 11/29/2017 0  % Final  . Basophils Absolute 11/29/2017 0.0  0.0 - 0.1 K/uL Final  . Color, Urine 11/29/2017 YELLOW  YELLOW Final  . APPearance 11/29/2017 CLEAR  CLEAR Final  . Specific Gravity, Urine 11/29/2017 1.040* 1.005 - 1.030 Final  . pH 11/29/2017 5.0  5.0 - 8.0 Final  . Glucose, UA 11/29/2017 NEGATIVE  NEGATIVE mg/dL Final  . Hgb urine dipstick 11/29/2017 SMALL* NEGATIVE Final  . Bilirubin Urine 11/29/2017 NEGATIVE  NEGATIVE Final  . Ketones, ur 11/29/2017 NEGATIVE  NEGATIVE mg/dL Final  . Protein, ur 11/29/2017 NEGATIVE  NEGATIVE mg/dL Final  . Nitrite 11/29/2017 POSITIVE* NEGATIVE Final  . Leukocytes, UA 11/29/2017 MODERATE* NEGATIVE Final  . RBC / HPF 11/29/2017 0-5  0 - 5 RBC/hpf Final  . WBC, UA 11/29/2017 6-30  0 - 5 WBC/hpf Final  . Bacteria, UA 11/29/2017 MANY* NONE SEEN Final  . Squamous Epithelial / LPF 11/29/2017 0-5* NONE SEEN Final  . Hyaline Casts, UA 11/29/2017 PRESENT   Final  . Prothrombin Time 11/29/2017 21.1* 11.4 - 15.2 seconds Final  . INR 11/29/2017 1.84   Final  . Lactic Acid, Venous 11/29/2017 6.99* 0.5 - 1.9 mmol/L Final  . Comment 11/29/2017 NOTIFIED PHYSICIAN   Final  . Lactic Acid, Venous 11/30/2017 4.9* 0.5 - 1.9 mmol/L Final   Comment: CRITICAL RESULT CALLED TO, READ BACK BY AND VERIFIED WITH: C.WARD,RN 0102 11/30/17 M.CAMPBELL   . Lactic Acid, Venous 11/30/2017 4.9* 0.5 - 1.9 mmol/L Final   Comment: CRITICAL RESULT CALLED TO, READ BACK BY AND VERIFIED WITH: T.IRBY,RN [id number] 11/30/17 M.CAMPBELL   . Procalcitonin 11/30/2017 <0.10  ng/mL Final   Comment:          Interpretation: PCT (Procalcitonin) <= 0.5 ng/mL: Systemic infection (sepsis) is not likely. Local bacterial infection is possible. (NOTE)       Sepsis PCT Algorithm           Lower Respiratory Tract                                      Infection PCT Algorithm    ----------------------------     ----------------------------         PCT < 0.25 ng/mL                PCT < 0.10 ng/mL         Strongly encourage             Strongly discourage   discontinuation of antibiotics    initiation of antibiotics    ----------------------------     -----------------------------  PCT 0.25 - 0.50 ng/mL            PCT 0.10 - 0.25 ng/mL               OR       >80% decrease in PCT            Discourage initiation of                                            antibiotics      Encourage discontinuation           of antibiotics    ----------------------------     -----------------------------         PCT >= 0.50 ng/mL              PCT 0.26 - 0.50 ng/mL               AND                                 <80% decrease in PCT             Encourage initiation of                                             antibiotics       Encourage continuation           of antibiotics    ----------------------------     -----------------------------        PCT >= 0.50 ng/mL                  PCT > 0.50 ng/mL               AND         increase in PCT                  Strongly encourage                                      initiation of antibiotics    Strongly encourage escalation           of antibiotics                                     -----------------------------                                           PCT <= 0.25 ng/mL                                                 OR                                        >  80% decrease in PCT                                     Discontinue / Do not initiate                                             antibiotics   . Sodium 11/30/2017 137  135 - 145 mmol/L Final  .  Potassium 11/30/2017 4.9  3.5 - 5.1 mmol/L Final  . Chloride 11/30/2017 99* 101 - 111 mmol/L Final  . CO2 11/30/2017 23  22 - 32 mmol/L Final  . Glucose, Bld 11/30/2017 113* 65 - 99 mg/dL Final  . BUN 11/30/2017 15  6 - 20 mg/dL Final  . Creatinine, Ser 11/30/2017 0.91  0.44 - 1.00 mg/dL Final  . Calcium 11/30/2017 10.3  8.9 - 10.3 mg/dL Final  . GFR calc non Af Amer 11/30/2017 58* >60 mL/min Final  . GFR calc Af Amer 11/30/2017 >60  >60 mL/min Final   Comment: (NOTE) The eGFR has been calculated using the CKD EPI equation. This calculation has not been validated in all clinical situations. eGFR's persistently <60 mL/min signify possible Chronic Kidney Disease.   . Anion gap 11/30/2017 15  5 - 15 Final  . WBC 11/30/2017 4.6  4.0 - 10.5 K/uL Final  . RBC 11/30/2017 3.31* 3.87 - 5.11 MIL/uL Final  . Hemoglobin 11/30/2017 11.4* 12.0 - 15.0 g/dL Final  . HCT 11/30/2017 33.8* 36.0 - 46.0 % Final  . MCV 11/30/2017 102.1* 78.0 - 100.0 fL Final  . MCH 11/30/2017 34.4* 26.0 - 34.0 pg Final  . MCHC 11/30/2017 33.7  30.0 - 36.0 g/dL Final  . RDW 11/30/2017 14.3  11.5 - 15.5 % Final  . Platelets 11/30/2017 227  150 - 400 K/uL Final  . Magnesium 11/30/2017 1.4* 1.7 - 2.4 mg/dL Final  . Phosphorus 11/30/2017 4.3  2.5 - 4.6 mg/dL Final  . Specimen Description 11/30/2017 BLOOD LEFT HAND   Final  . Special Requests 11/30/2017 BOTTLES DRAWN AEROBIC ONLY Blood Culture adequate volume   Final  . Culture 11/30/2017 NO GROWTH 1 DAY   Final  . Report Status 11/30/2017 PENDING   Incomplete  . Specimen Description 11/30/2017 BLOOD LEFT WRIST   Final  . Special Requests 11/30/2017 BOTTLES DRAWN AEROBIC ONLY Blood Culture adequate volume   Final  . Culture 11/30/2017 NO GROWTH 1 DAY   Final  . Report Status 11/30/2017 PENDING   Incomplete  . Prothrombin Time 11/30/2017 21.9* 11.4 - 15.2 seconds Final  . INR 11/30/2017 1.93   Final  . Heparin Unfractionated 11/30/2017 0.16* 0.30 - 0.70 IU/mL Final    Comment:        IF HEPARIN RESULTS ARE BELOW EXPECTED VALUES, AND PATIENT DOSAGE HAS BEEN CONFIRMED, SUGGEST FOLLOW UP TESTING OF ANTITHROMBIN III LEVELS.   Marland Kitchen Heparin Unfractionated 11/30/2017 <0.10* 0.30 - 0.70 IU/mL Final   Comment:        IF HEPARIN RESULTS ARE BELOW EXPECTED VALUES, AND PATIENT DOSAGE HAS BEEN CONFIRMED, SUGGEST FOLLOW UP TESTING OF ANTITHROMBIN III LEVELS.   Marland Kitchen Prothrombin Time 12/01/2017 31.7* 11.4 - 15.2 seconds Final  . INR 12/01/2017 3.10   Final  . Heparin Unfractionated 12/01/2017 0.14* 0.30 - 0.70 IU/mL Final   Comment:  IF HEPARIN RESULTS ARE BELOW EXPECTED VALUES, AND PATIENT DOSAGE HAS BEEN CONFIRMED, SUGGEST FOLLOW UP TESTING OF ANTITHROMBIN III LEVELS.   . WBC 12/01/2017 7.0  4.0 - 10.5 K/uL Final  . RBC 12/01/2017 3.22* 3.87 - 5.11 MIL/uL Final  . Hemoglobin 12/01/2017 11.0* 12.0 - 15.0 g/dL Final  . HCT 12/01/2017 33.0* 36.0 - 46.0 % Final  . MCV 12/01/2017 102.5* 78.0 - 100.0 fL Final  . MCH 12/01/2017 34.2* 26.0 - 34.0 pg Final  . MCHC 12/01/2017 33.3  30.0 - 36.0 g/dL Final  . RDW 12/01/2017 14.4  11.5 - 15.5 % Final  . Platelets 12/01/2017 241  150 - 400 K/uL Final  . Neutrophils Relative % 12/01/2017 75  % Final  . Neutro Abs 12/01/2017 5.2  1.7 - 7.7 K/uL Final  . Lymphocytes Relative 12/01/2017 14  % Final  . Lymphs Abs 12/01/2017 1.0  0.7 - 4.0 K/uL Final  . Monocytes Relative 12/01/2017 10  % Final  . Monocytes Absolute 12/01/2017 0.7  0.1 - 1.0 K/uL Final  . Eosinophils Relative 12/01/2017 1  % Final  . Eosinophils Absolute 12/01/2017 0.1  0.0 - 0.7 K/uL Final  . Basophils Relative 12/01/2017 0  % Final  . Basophils Absolute 12/01/2017 0.0  0.0 - 0.1 K/uL Final  . Sodium 12/01/2017 134* 135 - 145 mmol/L Final  . Potassium 12/01/2017 4.2  3.5 - 5.1 mmol/L Final  . Chloride 12/01/2017 99* 101 - 111 mmol/L Final  . CO2 12/01/2017 24  22 - 32 mmol/L Final  . Glucose, Bld 12/01/2017 79  65 - 99 mg/dL Final  . BUN  12/01/2017 23* 6 - 20 mg/dL Final  . Creatinine, Ser 12/01/2017 0.80  0.44 - 1.00 mg/dL Final  . Calcium 12/01/2017 9.2  8.9 - 10.3 mg/dL Final  . Total Protein 12/01/2017 5.4* 6.5 - 8.1 g/dL Final  . Albumin 12/01/2017 2.9* 3.5 - 5.0 g/dL Final  . AST 12/01/2017 45* 15 - 41 U/L Final  . ALT 12/01/2017 21  14 - 54 U/L Final  . Alkaline Phosphatase 12/01/2017 60  38 - 126 U/L Final  . Total Bilirubin 12/01/2017 0.5  0.3 - 1.2 mg/dL Final  . GFR calc non Af Amer 12/01/2017 >60  >60 mL/min Final  . GFR calc Af Amer 12/01/2017 >60  >60 mL/min Final   Comment: (NOTE) The eGFR has been calculated using the CKD EPI equation. This calculation has not been validated in all clinical situations. eGFR's persistently <60 mL/min signify possible Chronic Kidney Disease.   . Anion gap 12/01/2017 11  5 - 15 Final  . Magnesium 12/01/2017 1.8  1.7 - 2.4 mg/dL Final  . Procalcitonin 12/01/2017 <0.10  ng/mL Final   Comment:        Interpretation: PCT (Procalcitonin) <= 0.5 ng/mL: Systemic infection (sepsis) is not likely. Local bacterial infection is possible. (NOTE)       Sepsis PCT Algorithm           Lower Respiratory Tract                                      Infection PCT Algorithm    ----------------------------     ----------------------------         PCT < 0.25 ng/mL                PCT < 0.10 ng/mL  Strongly encourage             Strongly discourage   discontinuation of antibiotics    initiation of antibiotics    ----------------------------     -----------------------------       PCT 0.25 - 0.50 ng/mL            PCT 0.10 - 0.25 ng/mL               OR       >80% decrease in PCT            Discourage initiation of                                            antibiotics      Encourage discontinuation           of antibiotics    ----------------------------     -----------------------------         PCT >= 0.50 ng/mL              PCT 0.26 - 0.50 ng/mL               AND                                  <80% decrease in PCT             Encourage initiation of                                             antibiotics       Encourage continuation           of antibiotics    ----------------------------     -----------------------------        PCT >= 0.50 ng/mL                  PCT > 0.50 ng/mL               AND         increase in PCT                  Strongly encourage                                      initiation of antibiotics    Strongly encourage escalation           of antibiotics                                     -----------------------------                                           PCT <= 0.25 ng/mL  OR                                        > 80% decrease in PCT                                     Discontinue / Do not initiate                                             antibiotics   . LDH 12/01/2017 459* 98 - 192 U/L Final  . Procalcitonin 12/02/2017 <0.10  ng/mL Final   Comment:        Interpretation: PCT (Procalcitonin) <= 0.5 ng/mL: Systemic infection (sepsis) is not likely. Local bacterial infection is possible. (NOTE)       Sepsis PCT Algorithm           Lower Respiratory Tract                                      Infection PCT Algorithm    ----------------------------     ----------------------------         PCT < 0.25 ng/mL                PCT < 0.10 ng/mL         Strongly encourage             Strongly discourage   discontinuation of antibiotics    initiation of antibiotics    ----------------------------     -----------------------------       PCT 0.25 - 0.50 ng/mL            PCT 0.10 - 0.25 ng/mL               OR       >80% decrease in PCT            Discourage initiation of                                            antibiotics      Encourage discontinuation           of antibiotics    ----------------------------     -----------------------------         PCT >= 0.50 ng/mL              PCT  0.26 - 0.50 ng/mL               AND                                 <80% decrease in PCT             Encourage initiation of  antibiotics       Encourage continuation           of antibiotics    ----------------------------     -----------------------------        PCT >= 0.50 ng/mL                  PCT > 0.50 ng/mL               AND         increase in PCT                  Strongly encourage                                      initiation of antibiotics    Strongly encourage escalation           of antibiotics                                     -----------------------------                                           PCT <= 0.25 ng/mL                                                 OR                                        > 80% decrease in PCT                                     Discontinue / Do not initiate                                             antibiotics   . Prothrombin Time 12/02/2017 20.4* 11.4 - 15.2 seconds Final  . INR 12/02/2017 1.76   Final    (this displays the last labs from the last 3 days)  No results found for: TOTALPROTELP, ALBUMINELP, A1GS, A2GS, BETS, BETA2SER, GAMS, MSPIKE, SPEI (this displays SPEP labs)  No results found for: KPAFRELGTCHN, LAMBDASER, KAPLAMBRATIO (kappa/lambda light chains)  No results found for: HGBA, HGBA2QUANT, HGBFQUANT, HGBSQUAN (Hemoglobinopathy evaluation)   Lab Results  Component Value Date   LDH 459 (H) 12/01/2017    No results found for: IRON, TIBC, IRONPCTSAT (Iron and TIBC)  No results found for: FERRITIN  Urinalysis    Component Value Date/Time   COLORURINE YELLOW 11/29/2017 2145   APPEARANCEUR CLEAR 11/29/2017 2145   LABSPEC 1.040 (H) 11/29/2017 2145   PHURINE 5.0 11/29/2017 2145   GLUCOSEU NEGATIVE 11/29/2017 2145   HGBUR SMALL (A) 11/29/2017 2145   BILIRUBINUR NEGATIVE 11/29/2017 2145   [place] NEGATIVE 11/29/2017 2145   PROTEINUR NEGATIVE 11/29/2017 2145  UROBILINOGEN 0.2 08/11/2015 1244   NITRITE POSITIVE (A) 11/29/2017 2145   LEUKOCYTESUR MODERATE (A) 11/29/2017 2145     STUDIES: Ct Soft Tissue Neck Wo Contrast  Result Date: 11/29/2017 CLINICAL DATA:  82 y/o F; patient undergoing evaluation for lymphadenopathy with biopsy in the left subclavian area straightening, now presenting with swelling of the left arm and chest with associated severe pain. EXAM: CT NECK WITHOUT CONTRAST TECHNIQUE: Multidetector CT imaging of the neck was performed following the standard protocol without intravenous contrast. COMPARISON:  11/24/2017 CT of the neck. FINDINGS: Pharynx and larynx: Normal. No mass or swelling. Salivary glands: No inflammation, mass, or stone. Thyroid: Normal. Lymph nodes: Interval progression of diffuse lymphadenopathy, for example a left level 5B lymph node measures 12 x 12 mm, previously 6 mm (series 3, image 88). Increased lymphadenopathy is best appreciated left posterior cervical chain and left upper axilla but increase in adenopathy is present diffusely, for example a right upper peritracheal node measuring 24 x 31 mm, previously 17 x 23 mm (series 3, image 121). Fat stranding surrounding conglomerate lymph nodes throughout the right cervical chain, right greater than left supraclavicular regions, and left axilla may represent infiltrative neoplasm or lymphedema. Vascular: Calcific atherosclerosis of the aorta and carotid siphons. Limited intracranial: Negative. Visualized orbits: Negative. Mastoids and visualized paranasal sinuses: Small left maxillary sinus mucous retention cyst. Otherwise negative. Skeleton: Stable mild cervical spondylosis. No high-grade bony canal stenosis. Upper chest: Emphysema and scarring in bilateral upper lobes is stable. Other: None. IMPRESSION: Interval progression of lymphadenopathy from 11/24/2017 best appreciated in the left cervical level 5B station, but measurable increase is present diffusely. Findings indicate  rapidly progressive lymphoproliferative or metastatic disease. Correlation with biopsy is recommended. Electronically Signed   By: Kristine Garbe M.D.   On: 11/29/2017 22:54   Ct Soft Tissue Neck W Contrast  Result Date: 11/24/2017 CLINICAL DATA:  RIGHT neck and ear pain, enlarging RIGHT supraclavicular and retroauricular mass for 2 weeks. LEFT facial swelling. History of carotid artery surgery. EXAM: CT NECK WITH CONTRAST TECHNIQUE: Multidetector CT imaging of the neck was performed using the standard protocol following the bolus administration of intravenous contrast. CONTRAST:  35m ISOVUE-300 IOPAMIDOL (ISOVUE-300) INJECTION 61% COMPARISON:  CT chest September 11, 2015 FINDINGS: PHARYNX AND LARYNX: Normal.  Widely patent airway. SALIVARY GLANDS: Normal. THYROID: Normal. LYMPH NODES: Multilevel severe RIGHT cervical lymphadenopathy. 27 mm RIGHT level 1 B lymph node, round RIGHT level IIa 23 mm lymph node, 3.1 cm RIGHT supraclavicular lymph node with pericapsular fat stranding RIGHT neck lymphadenopathy. Mild LEFT neck lymphadenopathy including 11 mm intraparotid lymph nodes and 14 mm LEFT level IIa lymph node. VASCULAR: Normal. LIMITED INTRACRANIAL: Normal. VISUALIZED ORBITS: Surgical clips LEFT neck most compatible with endarterectomy. Severe calcific atherosclerosis RIGHT carotid bifurcation. Lymphadenopathy narrows RIGHT internal jugular vein which remains patent. MASTOIDS AND VISUALIZED PARANASAL SINUSES: Well-aerated. SKELETON: Nonacute. No destructive bony lesions. Patient is edentulous. UPPER CHEST: RIGHT apical bullous changes and scarring. Partially imaged bilateral hilar lymphadenopathy. Mediastinal lymphadenopathy including 2 cm aortopulmonary window lymph node, 17 mm pretracheal lymph node. LEFT greater than RIGHT axillary lymphadenopathy, at least 4 cm nodal conglomeration with LEFT retropectoral infiltrative mass. Heart size is probably enlarged. Severe coronary artery calcifications.  OTHER: IMPRESSION: 1. Severe RIGHT and mild LEFT neck lymphadenopathy. Severe LEFT axillary lymphadenopathy with lymph edema versus lymphangitic spread of tumor. LEFT retropectoral infiltrative mass. Mediastinal lymphadenopathy. Constellation of findings seen with lymphoproliferative disease, metastatic disease such as breast cancer given LEFT chest findings. Recommend CT chest  with contrast versus PET-CT. 2. These results will be called to the ordering clinician or representative by the Radiologist Assistant, and communication documented in the PACS or zVision Dashboard. Aortic Atherosclerosis (ICD10-I70.0). Electronically Signed   By: Elon Alas M.D.   On: 11/24/2017 20:19   Ct Chest W Contrast  Result Date: 11/29/2017 CLINICAL DATA:  Left-sided chest pain and swelling, recent biopsy on right supraclavicular lymph nodes EXAM: CT CHEST WITH CONTRAST TECHNIQUE: Multidetector CT imaging of the chest was performed during intravenous contrast administration. CONTRAST:  47m ISOVUE-300 IOPAMIDOL (ISOVUE-300) INJECTION 61% COMPARISON:  11/24/2017 FINDINGS: Cardiovascular: Atherosclerotic changes of the thoracic aorta are noted without aneurysmal dilatation or dissection. Coronary calcifications are seen. No pulmonary emboli are noted. Some attenuation of the pulmonary arterial branches is noted secondary to hilar adenopathy. Additionally some displacement of the vasculature in the neck is noted particularly on the right also related to adenopathy. Mediastinum/Nodes: Thoracic inlet demonstrates evidence of significant right supraclavicular lymphadenopathy. The largest nodal mass measures approximately 10 by 4.9 cm and causes displacement of the adjacent vascular structures in the right neck. Additional adenopathy is noted in the anterior neck similar to that seen on prior examination. Significant mediastinal and hilar adenopathy is identified. The largest of these in the right hilum measures 2.3 cm in short  axis. Subcarinal lymphadenopathy is noted measuring 16 mm in short axis. Prevascular adenopathy is noted measuring 2.2 cm in short axis. The esophagus is within normal limits. Considerable adenopathy is noted in the posterior mediastinum surrounding the descending aorta. The nodal mass measures approximately 2.9 by 3.9 cm. Considerable right axillary adenopathy is noted extending along the lateral chest wall. Lungs/Pleura: Emphysematous changes are noted in the lungs bilaterally. Mild scarring is seen. No focal parenchymal mass is noted. No sizable effusion is seen. Upper Abdomen: Gallbladder has been surgically removed. The liver is within normal limits. The adrenal glands and visualized spleen are unremarkable. Musculoskeletal: T9 compression deformity is noted. Some lucency is noted within. It would be difficult to exclude some metastatic involvement given the findings throughout the chest. Degenerative changes of the thoracic spine are seen. No rib abnormality is noted. Considerable soft tissue mass is noted in the left anterior chest wall involving the pectoral muscles and subpectoral region. The overall size of the mass measures approximately 10.5 by 6.7 cm and Ing gallstone the subclavian and axillary artery is and veins. Significant skin thickening and edema is noted within the left breast. It would be difficult to exclude a portion of this representing a breast mass. There is apparent involvement of the chest wall although no bony destruction is seen. IMPRESSION: Diffuse lymphadenopathy involving the supraclavicular regions, mediastinum, bilateral hila as well as the axillary regions bilaterally as described above. Correlation with the recent biopsy results is recommended. It would be difficult to exclude a portion of the abnormality in the left chest wall to be related to the overlying breast given the degree of skin thickening and edema identified inferiorly. Alternatively this may represent an aggressive  lymphoma. Further workup with PET-CT is recommended. T9 compression deformity with some lucencies noted within. This may be strictly related to the compression deformity although the possibility of metastatic disease would deserve consideration as well. No other definitive bony abnormality is noted. Aortic Atherosclerosis (ICD10-I70.0) and Emphysema (ICD10-J43.9). Electronically Signed   By: MInez CatalinaM.D.   On: 11/29/2017 20:01   Mr Neck Soft Tissue Only W Wo Contrast  Result Date: 11/30/2017 CLINICAL DATA:  Rapidly progressive lymphadenopathy, RIGHT  arm swelling since Thanksgiving. Suspected lymphoma. Status post biopsy, results pending. EXAM: MRI OF THE NECK WITH CONTRAST TECHNIQUE: Multiplanar, multisequence MR imaging was performed following the administration of intravenous contrast. CONTRAST:  62m MULTIHANCE GADOBENATE DIMEGLUMINE 529 MG/ML IV SOLN COMPARISON:  CT neck November 29, 2017 and CT neck November 24, 2017 FINDINGS: Moderately motion degraded examination. PHARYNX AND LARYNX: Normal.  Widely patent airway. SALIVARY GLANDS: Intraparotid lymph nodes bilaterally measuring to 17 mm in deep lobe. No definite primary parotid mass though limited by motion. THYROID: Normal. LYMPH NODES: Severe RIGHT greater than LEFT cervical lymphadenopathy with intermediate to bright T2 signal and, mild enhancement. Lymphadenopathy all levels of the neck, at least 8.4 x 4.2 cm RIGHT supraclavicular nodal conglomeration. Lymphadenopathy abuts the scalene muscles impinges upon the RIGHT brachials plexus. Limited assessment of the brachium plexus due to motion. Massive LEFT retropectoral lymphadenopathy, lesser extent on the RIGHT. VASCULAR: Normal flow-voids. LIMITED INTRACRANIAL: Normal. VISUALIZED ORBITS: Normal. MASTOIDS AND VISUALIZED PARANASAL SINUSES: Well-aerated. SKELETON: No abnormal bone marrow signal though not tailored for evaluation. Limited assessment of spinal cord due to motion, no suspicious  intracanalicular enhancement. UPPER CHEST: Lung apices are clear. No superior mediastinal lymphadenopathy. OTHER: Susceptibility artifact LEFT neck corresponding to use surgical clips. IMPRESSION: 1. Moderately motion degraded examination. 2. Bulky cervical and included chest lymphadenopathy with RIGHT brachial plexus impingement. Electronically Signed   By: CElon AlasM.D.   On: 11/30/2017 22:16   Dg Chest Portable 1 View  Result Date: 11/29/2017 CLINICAL DATA:  Abnormal lymphadenopathy in the neck, chest swelling, initial encounter EXAM: PORTABLE CHEST 1 VIEW COMPARISON:  11/24/2017 FINDINGS: Cardiac shadow is within normal limits. Soft tissue mass lesion is again noted superimposed over the left hilum similar to that seen on recent CT of the neck. Fullness in the hilar regions is noted bilaterally likely representing some adenopathy. Aortic calcifications are seen. The lungs are clear. No bony abnormality is noted. IMPRESSION: Changes consistent with the known left upper lobe mass lesion as well as apparent hilar adenopathy particularly on the right. CT of the chest is recommended for further evaluation as previously described. Electronically Signed   By: MInez CatalinaM.D.   On: 11/29/2017 19:10   UK[place]Core Biopsy (lymph Nodes)  Result Date: 11/28/2017 INDICATION: 82year old female with a clinical history of rapidly progressive right submental, cervical and supraclavicular lymphadenopathy. She presents today for urgent ultrasound-guided core biopsy. EXAM: ULTRASOUND GUIDED core needle BIOPSY OF right supraclavicular lymph node MEDICATIONS: None. ANESTHESIA/SEDATION: Fentanyl 100 mcg IV; Versed 2 mg IV Moderate Sedation Time:  15 minutes The patient was continuously monitored during the procedure by the interventional radiology nurse under my direct supervision. PROCEDURE: The procedure, risks, benefits, and alternatives were explained to the patient. Questions regarding the procedure were encouraged  and answered. The patient understands and consents to the procedure. The right submental region, the right cervical chain in the right supraclavicular lymph nodes or all interrogated with ultrasound. There are numerous enlarged, hypoechoic an abnormal appearing lymph nodes in each station. The most solid appearing lymph nodes are present in the right supraclavicular station. The right submental lymph node is slightly more cystic and may not yield viable cells. Therefore, the decision was made to proceed with biopsy of the right supraclavicular station lymph nodes. The right supraclavicular region was prepped with chlorhexidine in a sterile fashion, and a sterile drape was applied covering the operative field. A sterile gown and sterile gloves were used for the procedure. Local anesthesia was provided with  1% Lidocaine. A small dermatotomy was made. Under real-time sonographic guidance, numerous 18 gauge core biopsies were obtained of several lymph nodes in the right supraclavicular station. Biopsy specimens were placed in saline and delivered to pathology for further analysis. Post biopsy imaging demonstrates no evidence of hematoma or active bleeding. The patient tolerated the procedure well. There was no significant bleeding. COMPLICATIONS: None immediate. FINDINGS: Extensive hypoechoic lymphadenopathy throughout the right neck and right submental region. IMPRESSION: Technically successful ultrasound-guided core biopsy of right supraclavicular lymph nodes. Electronically Signed   By: Jacqulynn Cadet M.D.   On: 11/28/2017 09:04    ASSESSMENT: 82 y.o. [place] woman with rapidly developing lymphadenopathy involving chest, neck, axillae, and mediastinum; elevated LDH, other markers pending; biopsy of [address] LN 11/28/2017 results pending  PLAN: I discussed the normal role of the immune system and specifically lymph nodes, and discussed the possible causes of her adenopathy. Despite the 40+ PY smoking history  lungs are clear. She has had no recent mammograms but breast exam is WNL. She has no fever and does not appear ill except for the R-sided pain. Given all the above, we are most likely dealing with a high-grade lymphoma.This may be difficult to document from the scant biopsy obtained-- I would recommend repeat biopsy, specifically of the Right axillary mass. If that can be done today she can be started on steroids (would suggest dexamethasone 10 mg IV QID for 4 days) for pain control.   Once we have the definitive diagnosis can discuss treatment options with patient.   Appreciate consult. Will follow with you  Chauncey Cruel, MD   12/02/2017 8:39 AM Medical Oncology and Hematology [facility] [address] [place], [place] [address] Tel. [phone]    Fax. [phone]

## 2017-12-02 NOTE — Progress Notes (Addendum)
ANTICOAGULATION CONSULT NOTE - FOLLOW UP    HL = < 0.1 (goal 0.3 - 0.7 units/mL) Heparin dosing weight = 73 kg   Assessment: 82 YOF with history of AFib on Coumadin PTA.  Patient has rapidly progressing lymphadenopathy and was to be transitioned to heparin bridge when INR fell below two this AM.  However, heparin was not started because patient underwent biopsy this afternoon.  Undetectable heparin level is as expected.  No bleeding reported and RN started heparin after the procedure.   Plan: Continue heparin gtt at 1050 units/hr Check 8 hr heparin level   Genia Perin D. Mina Marble, PharmD, BCPS 12/02/2017, 5:53 PM

## 2017-12-02 NOTE — Progress Notes (Signed)
Patient back from biopsy. Patient VS are stable. Patient will be on bedrest for approx 3 hours. Nurse reported that patient tolerated procedure well. Patient is resting in bed with husband by bedside.

## 2017-12-02 NOTE — Sedation Documentation (Signed)
Transport here to return pt to 4E09, tolerated well. VSS. No c/o pain.

## 2017-12-02 NOTE — Procedures (Signed)
Interventional Radiology Procedure Note  Procedure: US guided left axillary node biopsy.  Mx 18g core biopsy.   Complications: None Recommendations:  - Follow up pathology - Do not submerge for 7 days - Routine wound care   Signed,  Dulcy Fanny. Earleen Newport, DO

## 2017-12-02 NOTE — Progress Notes (Signed)
[facility] Progress Note  Patient: Colleen [name] JJO:[id number]   PCP: Ann Held, DO DOB: 20-Aug-1935   DOA: 11/29/2017   DOS: 12/02/2017   Date of Service: the patient was seen and examined on 12/02/2017  Subjective: Arm pain is about the same although patient did not receive any pain medication since 830 last night.  The sling is not effective in controlling pain.  No nausea no vomiting.  No abdominal pain.  No shortness of breath.  Brief hospital course: Pt. with PMH of A. fib on Coumadin, CHF, HTN, hypothyroidism, CVA; admitted on 11/29/2017, presented with complaint of right arm pain, was found to have brachial neuritis. Currently further plan is continue current pain management.  Assessment and Plan: 1.  Diffuse lymphadenopathy. Right arm pain.  Suspected brachial neuritis. Aggressive high-grade lymphoma??  Presented with complaints of right arm pain ongoing for last 2 days prior to admission. Has noted neck mass ongoing since Thanksgiving.  Seen by PCP.  CT scan chest 11/24/2017 shows lymphadenopathy.  Undergoes FNAC 11/28/2017.  Results currently pending. Comes to hospital with severe right arm pain and shortness of breath. MRI neck shows brachial plexus impingement likely brachial neuritis from lymphadenopathy.  Use arm sling, as needed narcotics for pain management for now. Ideal treatment would be steroids although since FNAC are notoriously not diagnostic, patient may require excision biopsy to get further detail about the diagnosis and steroids can mask the diagnosis or delayed.  Discussed with oncology Dr. Jana Hakim, tumor markers sent to rule out other etiology. Highly appreciate oncology input.  They consulted IR for a repeat biopsy.  I consulted general surgery for excisional lymph node biopsy although since the oncology has already arranged for a repeat IR guided needle biopsy; excisional lymph node biopsy is currently on hold.  2.  Concern for airway  compromise with hypoxia. Initially presented with right arm pain, had hypoxia with midline shift of the trachea due to bulky lymphadenopathy. There was initial concern for a COPD exacerbation and therefore patient received IV Solu-Medrol in the ER. CCM was consulted, recommended stepdown admission. Patient currently appears to be hemodynamically stable and in no respiratory distress. Monitor for now.  3.  Chronic A. fib. On Coumadin. Recent biopsy. Anticoagulation was on hold for the biopsy as an outpatient. On admission started on IV heparin, now INR is supratherapeutic and therefore heparin is on hold. At present I will not resume Coumadin since the patient may require excisional lymph node biopsy. Currently holding off on that. Continue flecainide.  4.  Mild hypercalcemia. Continue IV fluids. Monitor.  5.  Concern for UTI. Started on Rocephin. Cultures are sent. Doubt UTI for now.  Continue antibiotics for today.  Will stop today, total 3-day treatment course.  6.  Lactic acidosis. More likely due to the fact the patient has rapidly developing lymphadenopathy rather than hypoperfusion. Monitor.  7.  Essential hypertension. Blood pressure is well controlled. Monitor.  8.  Hypothyroidism. Continue Synthroid.  Diet: cardiac diet DVT Prophylaxis:on therapeutic anticoagulation.  Advance goals of care discussion: full copde  Family Communication: no family was present at bedside, at the time of interview.  Disposition:  Discharge to be determined.   Consultants: PCCM, oncology, IR, general surgery Procedures: IR guided needle biopsy  Antibiotics: Anti-infectives (From admission, onward)   Start     Dose/Rate Route Frequency Ordered Stop   11/30/17 2200  cefTRIAXone (ROCEPHIN) 1 g in dextrose 5 % 50 mL IVPB     1 g  100 mL/hr over 30 Minutes Intravenous Every 24 hours 11/30/17 0523     11/29/17 2300  cefTRIAXone (ROCEPHIN) 1 g in dextrose 5 % 50 mL IVPB     1 g 100  mL/hr over 30 Minutes Intravenous  Once 11/29/17 2258 11/30/17 0006       Objective: Physical Exam: Vitals:   12/02/17 1500 12/02/17 1505 12/02/17 1510 12/02/17 1610  BP: 131/62 (!) 124/56 131/62 (!) 150/62  Pulse: 88 86 88 83  Resp: 11 12 16 13   Temp:    98.2 F (36.8 C)  TempSrc:    Oral  SpO2: 96% 97% 95% 96%  Weight:      Height:        Intake/Output Summary (Last 24 hours) at 12/02/2017 1626 Last data filed at 12/02/2017 0856 Gross per 24 hour  Intake 360 ml  Output 500 ml  Net -140 ml   Filed Weights   11/30/17 0208 12/01/17 0455 12/02/17 0324  Weight: 75.3 kg (166 lb 0.1 oz) 80.1 kg (176 lb 9.4 oz) 84.6 kg (186 lb 8.2 oz)   General: Alert, Awake and Oriented to Time, Place and Person. Appear in moderate distress, affect appropriate Eyes: PERRL, Conjunctiva normal ENT: Oral Mucosa clear moist. Neck: difficult to assess JVD, no Abnormal Mass Or lumps Cardiovascular: S1 and S2 Present, no Murmur, Peripheral Pulses Present Respiratory: normal respiratory effort, Bilateral Air entry equal and Decreased, no use of accessory muscle, Clear to Auscultation, no Crackles, no wheezes Abdomen: Bowel Sound present, Soft and no tenderness, no hernia Skin: no redness, no Rash, no induration Extremities: no Pedal edema, no calf tenderness Neurologic: Grossly no focal neuro deficit. Bilaterally Equal motor strength  Data Reviewed: CBC: Recent Labs  Lab 11/28/17 0556 11/29/17 1756 11/30/17 0239 12/01/17 0700  WBC 5.4 7.7 4.6 7.0  NEUTROABS  --  6.1  --  5.2  HGB 12.1 12.8 11.4* 11.0*  HCT 36.4 38.3 33.8* 33.0*  MCV 101.7* 103.0* 102.1* 102.5*  PLT 258 291 227 027   Basic Metabolic Panel: Recent Labs  Lab 11/29/17 1756 11/30/17 0239 12/01/17 0700  NA 133* 137 134*  K 4.3 4.9 4.2  CL 98* 99* 99*  CO2 20* 23 24  GLUCOSE 101* 113* 79  BUN 14 15 23*  CREATININE 1.06* 0.91 0.80  CALCIUM 10.8* 10.3 9.2  MG  --  1.4* 1.8  PHOS  --  4.3  --     Liver Function  Tests: Recent Labs  Lab 11/29/17 1756 12/01/17 0700  AST 53* 45*  ALT 22 21  ALKPHOS 74 60  BILITOT 0.6 0.5  PROT 6.4* 5.4*  ALBUMIN 3.4* 2.9*   No results for input(s): LIPASE, AMYLASE in the last 168 hours. No results for input(s): AMMONIA in the last 168 hours. Coagulation Profile: Recent Labs  Lab 11/28/17 0556 11/29/17 1756 11/30/17 0239 12/01/17 0527 12/02/17 0531  INR 1.91 1.84 1.93 3.10 1.76   Cardiac Enzymes: No results for input(s): CKTOTAL, CKMB, CKMBINDEX, TROPONINI in the last 168 hours. BNP (last 3 results) No results for input(s): PROBNP in the last 8760 hours. CBG: No results for input(s): GLUCAP in the last 168 hours. Studies: [place] Core Biopsy (lymph Nodes)  Result Date: 12/02/2017 INDICATION: 82 year old female with presumed lymphoma. She presents for biopsy of nodal mass of the left axilla. EXAM: ULTRASOUND-GUIDED LYMPH NODE BIOPSY MEDICATIONS: None. ANESTHESIA/SEDATION: Moderate (conscious) sedation was employed during this procedure. A total of Versed 2.0 mg and Fentanyl 50 mcg was administered intravenously. Moderate  Sedation Time: 10 minutes. The patient's level of consciousness and vital signs were monitored continuously by radiology nursing throughout the procedure under my direct supervision. FLUOROSCOPY TIME:  None COMPLICATIONS: None PROCEDURE: Informed written consent was obtained from the patient after a thorough discussion of the procedural risks, benefits and alternatives. All questions were addressed. Maximal Sterile Barrier Technique was utilized including caps, mask, sterile gowns, sterile gloves, sterile drape, hand hygiene and skin antiseptic. A timeout was performed prior to the initiation of the procedure. Patient positioned supine position on the ultrasound stretcher. Ultrasound images of the left axillary region were performed with images stored and sent to PACs. The patient is prepped and draped in the usual sterile fashion. The skin and  subcutaneous tissues were generously infiltrated 1% lidocaine for local anesthesia. Using ultrasound guidance, at least 8 separate 18 gauge core biopsy of the left axillary node were performed. Specimen placed in the saline. Final image was stored. Patient tolerated the procedure well and remained hemodynamically stable throughout. No complications were encountered and no significant blood loss. IMPRESSION: Status post ultrasound-guided biopsy of left axillary mass. Tissue specimen sent to pathology for complete histopathologic analysis. Signed, Dulcy Fanny. Earleen Newport, DO [facility] Electronically Signed   By: Corrie Mckusick D.O.   On: 12/02/2017 15:51    Scheduled Meds: . arformoterol  15 mcg Nebulization BID  . budesonide (PULMICORT) nebulizer solution  0.25 mg Nebulization BID  . fentaNYL      . flecainide  50 mg Oral BID  . levothyroxine  88 mcg Oral QAC breakfast  . lidocaine (PF)      . midazolam      . polyethylene glycol  17 g Oral Daily  . senna-docusate  1 tablet Oral QHS   Continuous Infusions: . sodium chloride 100 mL/hr at 12/02/17 0218  . cefTRIAXone (ROCEPHIN)  IV Stopped (12/01/17 2311)  . heparin     PRN Meds: acetaminophen **OR** acetaminophen, hydrALAZINE, HYDROmorphone (DILAUDID) injection, ipratropium-albuterol, ondansetron **OR** ondansetron (ZOFRAN) IV, oxyCODONE  Time spent: 35 minutes  Author: Berle Mull, MD [facility] Pager: [phone] 12/02/2017 4:26 PM  If 7PM-7AM, please contact night-coverage at [email or web address], password [facility]

## 2017-12-02 NOTE — Progress Notes (Signed)
Referring Physician(s): Dr. Jana Hakim  Supervising Physician: Corrie Mckusick  Patient Status:  [name] - In-pt  Chief Complaint: Lymphadenopathy  Subjective: Patient s/p biopsy 11/28/17.  Request for repeat biopsy from Dr. Jana Hakim this AM.  Discussed case with Dr. Jana Hakim, pathology tech, and Dr. Earleen Newport.  Will proceed with second biopsy.   Allergies: Patient has no known allergies.  Medications: Prior to Admission medications   Medication Sig Start Date End Date Taking? Authorizing Provider  acetaminophen (TYLENOL) 500 MG tablet Take 1,000 mg by mouth every 6 (six) hours as needed for headache (pain).   Yes [provider]  flecainide (TAMBOCOR) 100 MG tablet Take 50 mg by mouth 2 (two) times daily.   Yes [provider]  furosemide (LASIX) 40 MG tablet Take 1 tablet (40 mg total) by mouth daily. Will take extra 40mg  PRN for swelling Patient taking differently: Take 40 mg by mouth See admin instructions. Take 1 tablet (40 mg) by mouth daily, may also take extra tablet (40mg ) as needed for ankle/ leg swelling 04/19/16  Yes Larey Dresser, MD  levothyroxine (SYNTHROID, LEVOTHROID) 88 MCG tablet Take 88 mcg by mouth daily before breakfast.    Yes [provider]  lisinopril (PRINIVIL,ZESTRIL) 20 MG tablet Take 1 tablet (20 mg total) by mouth 2 (two) times daily. Patient taking differently: Take 20 mg by mouth daily.  04/09/17  Yes Lorretta Harp, MD  warfarin (COUMADIN) 5 MG tablet Take 2.5-5 mg by mouth See admin instructions. Take 1 tablet (5 mg) by mouth on Monday and Friday after supper, take 1/2 tablet (2.5 mg) on Sunday, Tuesday, Wednesday, Thursday, Saturday with supper   Yes [provider]     Vital Signs: BP 135/68 (BP Location: Left Arm)   Pulse 86   Temp 98.4 F (36.9 C) (Oral)   Resp 14   Ht 5\' 4"  (1.626 m)   Wt 186 lb 8.2 oz (84.6 kg)   SpO2 95%   BMI 32.01 kg/m   Physical Exam  Constitutional: She is oriented to person,  place, and time.  Cardiovascular: Normal rate and regular rhythm.  Pulmonary/Chest: Effort normal and breath sounds normal. No respiratory distress.  Neurological: She is alert and oriented to person, place, and time.  Skin:  Previous biopsy site healing well  Psychiatric: She has a normal mood and affect. Her behavior is normal. Judgment and thought content normal.  Nursing note and vitals reviewed.   NAD, alert   Imaging: Ct Soft Tissue Neck Wo Contrast  Result Date: 11/29/2017 CLINICAL DATA:  82 y/o F; patient undergoing evaluation for lymphadenopathy with biopsy in the left subclavian area straightening, now presenting with swelling of the left arm and chest with associated severe pain. EXAM: CT NECK WITHOUT CONTRAST TECHNIQUE: Multidetector CT imaging of the neck was performed following the standard protocol without intravenous contrast. COMPARISON:  11/24/2017 CT of the neck. FINDINGS: Pharynx and larynx: Normal. No mass or swelling. Salivary glands: No inflammation, mass, or stone. Thyroid: Normal. Lymph nodes: Interval progression of diffuse lymphadenopathy, for example a left level 5B lymph node measures 12 x 12 mm, previously 6 mm (series 3, image 88). Increased lymphadenopathy is best appreciated left posterior cervical chain and left upper axilla but increase in adenopathy is present diffusely, for example a right upper peritracheal node measuring 24 x 31 mm, previously 17 x 23 mm (series 3, image 121). Fat stranding surrounding conglomerate lymph nodes throughout the right cervical chain, right greater than left supraclavicular  regions, and left axilla may represent infiltrative neoplasm or lymphedema. Vascular: Calcific atherosclerosis of the aorta and carotid siphons. Limited intracranial: Negative. Visualized orbits: Negative. Mastoids and visualized paranasal sinuses: Small left maxillary sinus mucous retention cyst. Otherwise negative. Skeleton: Stable mild cervical spondylosis. No  high-grade bony canal stenosis. Upper chest: Emphysema and scarring in bilateral upper lobes is stable. Other: None. IMPRESSION: Interval progression of lymphadenopathy from 11/24/2017 best appreciated in the left cervical level 5B station, but measurable increase is present diffusely. Findings indicate rapidly progressive lymphoproliferative or metastatic disease. Correlation with biopsy is recommended. Electronically Signed   By: Kristine Garbe M.D.   On: 11/29/2017 22:54   Ct Chest W Contrast  Result Date: 11/29/2017 CLINICAL DATA:  Left-sided chest pain and swelling, recent biopsy on right supraclavicular lymph nodes EXAM: CT CHEST WITH CONTRAST TECHNIQUE: Multidetector CT imaging of the chest was performed during intravenous contrast administration. CONTRAST:  43mL ISOVUE-300 IOPAMIDOL (ISOVUE-300) INJECTION 61% COMPARISON:  11/24/2017 FINDINGS: Cardiovascular: Atherosclerotic changes of the thoracic aorta are noted without aneurysmal dilatation or dissection. Coronary calcifications are seen. No pulmonary emboli are noted. Some attenuation of the pulmonary arterial branches is noted secondary to hilar adenopathy. Additionally some displacement of the vasculature in the neck is noted particularly on the right also related to adenopathy. Mediastinum/Nodes: Thoracic inlet demonstrates evidence of significant right supraclavicular lymphadenopathy. The largest nodal mass measures approximately 10 by 4.9 cm and causes displacement of the adjacent vascular structures in the right neck. Additional adenopathy is noted in the anterior neck similar to that seen on prior examination. Significant mediastinal and hilar adenopathy is identified. The largest of these in the right hilum measures 2.3 cm in short axis. Subcarinal lymphadenopathy is noted measuring 16 mm in short axis. Prevascular adenopathy is noted measuring 2.2 cm in short axis. The esophagus is within normal limits. Considerable adenopathy is  noted in the posterior mediastinum surrounding the descending aorta. The nodal mass measures approximately 2.9 by 3.9 cm. Considerable right axillary adenopathy is noted extending along the lateral chest wall. Lungs/Pleura: Emphysematous changes are noted in the lungs bilaterally. Mild scarring is seen. No focal parenchymal mass is noted. No sizable effusion is seen. Upper Abdomen: Gallbladder has been surgically removed. The liver is within normal limits. The adrenal glands and visualized spleen are unremarkable. Musculoskeletal: T9 compression deformity is noted. Some lucency is noted within. It would be difficult to exclude some metastatic involvement given the findings throughout the chest. Degenerative changes of the thoracic spine are seen. No rib abnormality is noted. Considerable soft tissue mass is noted in the left anterior chest wall involving the pectoral muscles and subpectoral region. The overall size of the mass measures approximately 10.5 by 6.7 cm and Ing gallstone the subclavian and axillary artery is and veins. Significant skin thickening and edema is noted within the left breast. It would be difficult to exclude a portion of this representing a breast mass. There is apparent involvement of the chest wall although no bony destruction is seen. IMPRESSION: Diffuse lymphadenopathy involving the supraclavicular regions, mediastinum, bilateral hila as well as the axillary regions bilaterally as described above. Correlation with the recent biopsy results is recommended. It would be difficult to exclude a portion of the abnormality in the left chest wall to be related to the overlying breast given the degree of skin thickening and edema identified inferiorly. Alternatively this may represent an aggressive lymphoma. Further workup with PET-CT is recommended. T9 compression deformity with some lucencies noted within. This may be  strictly related to the compression deformity although the possibility of  metastatic disease would deserve consideration as well. No other definitive bony abnormality is noted. Aortic Atherosclerosis (ICD10-I70.0) and Emphysema (ICD10-J43.9). Electronically Signed   By: Inez Catalina M.D.   On: 11/29/2017 20:01   Mr Neck Soft Tissue Only W Wo Contrast  Result Date: 11/30/2017 CLINICAL DATA:  Rapidly progressive lymphadenopathy, RIGHT arm swelling since Thanksgiving. Suspected lymphoma. Status post biopsy, results pending. EXAM: MRI OF THE NECK WITH CONTRAST TECHNIQUE: Multiplanar, multisequence MR imaging was performed following the administration of intravenous contrast. CONTRAST:  84mL MULTIHANCE GADOBENATE DIMEGLUMINE 529 MG/ML IV SOLN COMPARISON:  CT neck November 29, 2017 and CT neck November 24, 2017 FINDINGS: Moderately motion degraded examination. PHARYNX AND LARYNX: Normal.  Widely patent airway. SALIVARY GLANDS: Intraparotid lymph nodes bilaterally measuring to 17 mm in deep lobe. No definite primary parotid mass though limited by motion. THYROID: Normal. LYMPH NODES: Severe RIGHT greater than LEFT cervical lymphadenopathy with intermediate to bright T2 signal and, mild enhancement. Lymphadenopathy all levels of the neck, at least 8.4 x 4.2 cm RIGHT supraclavicular nodal conglomeration. Lymphadenopathy abuts the scalene muscles impinges upon the RIGHT brachials plexus. Limited assessment of the brachium plexus due to motion. Massive LEFT retropectoral lymphadenopathy, lesser extent on the RIGHT. VASCULAR: Normal flow-voids. LIMITED INTRACRANIAL: Normal. VISUALIZED ORBITS: Normal. MASTOIDS AND VISUALIZED PARANASAL SINUSES: Well-aerated. SKELETON: No abnormal bone marrow signal though not tailored for evaluation. Limited assessment of spinal cord due to motion, no suspicious intracanalicular enhancement. UPPER CHEST: Lung apices are clear. No superior mediastinal lymphadenopathy. OTHER: Susceptibility artifact LEFT neck corresponding to use surgical clips. IMPRESSION: 1.  Moderately motion degraded examination. 2. Bulky cervical and included chest lymphadenopathy with RIGHT brachial plexus impingement. Electronically Signed   By: Elon Alas M.D.   On: 11/30/2017 22:16   Dg Chest Portable 1 View  Result Date: 11/29/2017 CLINICAL DATA:  Abnormal lymphadenopathy in the neck, chest swelling, initial encounter EXAM: PORTABLE CHEST 1 VIEW COMPARISON:  11/24/2017 FINDINGS: Cardiac shadow is within normal limits. Soft tissue mass lesion is again noted superimposed over the left hilum similar to that seen on recent CT of the neck. Fullness in the hilar regions is noted bilaterally likely representing some adenopathy. Aortic calcifications are seen. The lungs are clear. No bony abnormality is noted. IMPRESSION: Changes consistent with the known left upper lobe mass lesion as well as apparent hilar adenopathy particularly on the right. CT of the chest is recommended for further evaluation as previously described. Electronically Signed   By: Inez Catalina M.D.   On: 11/29/2017 19:10    Labs:  CBC: Recent Labs    11/28/17 0556 11/29/17 1756 11/30/17 0239 12/01/17 0700  WBC 5.4 7.7 4.6 7.0  HGB 12.1 12.8 11.4* 11.0*  HCT 36.4 38.3 33.8* 33.0*  PLT 258 291 227 241    COAGS: Recent Labs    11/28/17 0556 11/29/17 1756 11/30/17 0239 12/01/17 0527 12/02/17 0531  INR 1.91 1.84 1.93 3.10 1.76  APTT 38*  --   --   --   --     BMP: Recent Labs    11/14/17 1243 11/29/17 1756 11/30/17 0239 12/01/17 0700  NA 137 133* 137 134*  K 4.4 4.3 4.9 4.2  CL 101 98* 99* 99*  CO2 28 20* 23 24  GLUCOSE 81 101* 113* 79  BUN 15 14 15  23*  CALCIUM 9.7 10.8* 10.3 9.2  CREATININE 0.74 1.06* 0.91 0.80  GFRNONAA  --  48* 58* >60  GFRAA  --  56* >60 >60    LIVER FUNCTION TESTS: Recent Labs    11/14/17 1243 11/29/17 1756 12/01/17 0700  BILITOT 1.1 0.6 0.5  AST 30 53* 45*  ALT 28 22 21   ALKPHOS 64 74 60  PROT 7.1 6.4* 5.4*  ALBUMIN 3.9 3.4* 2.9*    Assessment  and Plan: Lymphadenopathy Repeat biopsy requested by Dr. Jana Hakim; target L axillary lymph node.  Discussed case with pathology staff as well as Dr. Earleen Newport who is agreeable to proceed.  Patient had breakfast this AM.  Anticipate procedure this afternoon as schedule allows.  INR 1.76.  OK to proceed per Dr. Earleen Newport.  Risks and benefits discussed with the patient including, but not limited to bleeding, infection, damage to adjacent structures or low yield requiring additional tests. All of the patient's questions were answered, patient is agreeable to proceed. Consent signed and in chart.  Electronically Signed: Docia Barrier, PA 12/02/2017, 12:29 PM   I spent a total of 15 Minutes at the the patient's bedside AND on the patient's hospital floor or unit, greater than 50% of which was counseling/coordinating care for lymphadenopathy

## 2017-12-02 NOTE — Sedation Documentation (Signed)
Patient is resting comfortably.  Tolerating well 

## 2017-12-02 NOTE — Progress Notes (Signed)
[place] for Heparin (warfarin on hold) Indication: atrial fibrillation  No Known Allergies  Patient Measurements: Height: 5\' 4"  (162.6 cm) Weight: 186 lb 8.2 oz (84.6 kg) IBW/kg (Calculated) : 54.7  Vital Signs: Temp: 98.4 F (36.9 C) (12/11 0324) Temp Source: Oral (12/11 0324) BP: 142/69 (12/11 0324) Pulse Rate: 88 (12/11 0324)  Labs: Recent Labs    11/29/17 1756 11/30/17 0239 11/30/17 1224 11/30/17 2253 12/01/17 0527 12/01/17 0700 12/02/17 0531  HGB 12.8 11.4*  --   --   --  11.0*  --   HCT 38.3 33.8*  --   --   --  33.0*  --   PLT 291 227  --   --   --  241  --   LABPROT 21.1* 21.9*  --   --  31.7*  --  20.4*  INR 1.84 1.93  --   --  3.10  --  1.76  HEPARINUNFRC  --   --  0.16* <0.10* 0.14*  --   --   CREATININE 1.06* 0.91  --   --   --  0.80  --     Estimated Creatinine Clearance: 59.1 mL/min (by C-G formula based on SCr of 0.8 mg/dL).   Medical History: Past Medical History:  Diagnosis Date  . Atrial fibrillation ([place])   . Carotid artery disease ([place])   . CHF (congestive heart failure) ([place])   . Hyperlipidemia   . Hypertension   . Hypothyroidism   . Shortness of breath   . TIA (transient ischemic attack) 2001    Assessment: 82 y/o F with rapidly progressing lymphadenopathy of the neck (likely malignancy). Warfarin PTA for afib and on hold for biopsy. She was previously on heparin at 1050 units/hr -INR= 1.76   Goal of Therapy:  Heparin level 0.3-0.7 units/ml Monitor platelets by anticoagulation protocol: Yes   Plan:  -restart heparin 1050 units/hr -Heparin level in 8 hours and daily wth CBC daily  Hildred Laser, Pharm D 12/02/2017 8:10 AM

## 2017-12-02 NOTE — Sedation Documentation (Signed)
Patient is resting comfortably.  Tolerated well.

## 2017-12-02 NOTE — Sedation Documentation (Signed)
Dr Earleen Newport on phone with husband, updating about procedure.

## 2017-12-02 NOTE — Care Management Note (Signed)
Case Management Note Marvetta Gibbons RN, BSN Unit 4E-Case Manager [phone]  Patient Details  Name: [name] MRN: [id number] Date of Birth: 10-26-1935  Subjective/Objective:   Pt admitted with  Diffuse lymphadenopathy. Right arm pain.  Suspected brachial neuritis. Aggressive lymphoma?? Oncology following                 Action/Plan: PTA pt lived at home with spouse- was independent prior to illness- CM to follow for transition needs.   Expected Discharge Date:  12/02/17               Expected Discharge Plan:     In-House Referral:     Discharge planning Services  CM Consult  Post Acute Care Choice:    Choice offered to:     DME Arranged:    DME Agency:     HH Arranged:    HH Agency:     Status of Service:  In process, will continue to follow  If discussed at Long Length of Stay Meetings, dates discussed:    Discharge Disposition:   Additional Comments:  Dawayne Patricia, RN 12/02/2017, 11:55 AM

## 2017-12-02 NOTE — Consult Note (Signed)
[facility] Surgery Consult/Admission Note  Colleen [name] 27-May-1935  [id number].    Requesting MD: Dr. Posey Pronto Chief Complaint/Reason for Consult: R axillary lymph node biopsy  HPI:   Pt is a 82 year old female with a history of A. fib on Coumadin, CHF, HTN, hypothyroidism, CVA; admitted on 11/29/2017, presented with complaint of right arm pain, was found to have brachial neuritis. Pt states she was in her normal state of health until around Thanksgiving when she noticed a mass in her neck. CT on 12/03 showed significant adenopathy bilaterally (neck, intraparotid, medistinal, axillary). Biopsy of a R [place] LN 11/28/2017 is still pending. Pt developed significant R arm pain with swelling and was admitted on 12/08. CT on 12/08 showed massive adenopathy and L anterior chest wall mass measuring up to 10.5cm. We were consulted for R axilla lymph node biopsy. Incidentally, IR was also consulted and they are performing a biopsy today. I spoke with Dr. Posey Pronto who stated that we are not needed for biopsy at this time.   Pt states pain, swelling, and warmth of R arm. She is unable to move arm due to pain. Pain is constant, worse with touch or movement, non radiating, more to her anterior arm and pain medicine helps. She is also complaining of swelling in her throat and makes her feel like she is choking. Still able to breath and swallow without difficulty. No fever, chills, abdominal pain, nausea or vomiting.  ROS:  Review of Systems  Constitutional: Negative for chills, fever and malaise/fatigue.  Respiratory: Negative for shortness of breath.   Cardiovascular: Negative for chest pain.  Gastrointestinal: Negative for abdominal pain, nausea and vomiting.  Musculoskeletal: Positive for joint pain (RUE pain and swelling). Negative for falls.  Neurological: Negative for loss of consciousness and weakness.  Endo/Heme/Allergies: Bruises/bleeds easily (on coumadin).       Diffuse adenopathy of b/l axilla  and neck  All other systems reviewed and are negative.    Family History  Problem Relation Age of Onset  . Heart failure Mother   . Parkinson's disease Mother   . Arthritis Mother   . Arthritis Sister   . Colon cancer Brother   . Rheumatologic disease Neg Hx   . Lung disease Neg Hx     Past Medical History:  Diagnosis Date  . Atrial fibrillation ([place])   . Carotid artery disease ([place])   . CHF (congestive heart failure) ([place])   . Hyperlipidemia   . Hypertension   . Hypothyroidism   . Shortness of breath   . TIA (transient ischemic attack) 2001    Past Surgical History:  Procedure Laterality Date  . ABDOMINAL HYSTERECTOMY  1975   TAH,BSO  . CARDIAC CATHETERIZATION N/A 08/14/2015   Procedure: Right Heart Cath;  Surgeon: Larey Dresser, MD;  Location: [facility];  Service: Cardiovascular;  Laterality: N/A;  . carotid duplex  11/24/2012  . carotid duplex  11/01/2010  . CAROTID ENDARTERECTOMY  2001   left  . CHOLECYSTECTOMY    . DOPPLER ECHOCARDIOGRAPHY  02/13/2010   EF>55%  . DOPPLER ECHOCARDIOGRAPHY  09/29/2007  . NM MYOVIEW LTD  02/13/2010  . renal duplex  02/13/2010  . VIDEO BRONCHOSCOPY Bilateral 08/09/2015   Procedure: VIDEO BRONCHOSCOPY WITHOUT FLUORO;  Surgeon: Collene Gobble, MD;  Location: [facility];  Service: Cardiopulmonary;  Laterality: Bilateral;    Social History:  reports that she quit smoking about 17 years ago. Her smoking use included cigarettes. She has a 47.00 pack-year  smoking history. she has never used smokeless tobacco. She reports that she drinks alcohol. She reports that she does not use drugs.  Allergies: No Known Allergies  Medications Prior to Admission  Medication Sig Dispense Refill  . acetaminophen (TYLENOL) 500 MG tablet Take 1,000 mg by mouth every 6 (six) hours as needed for headache (pain).    . flecainide (TAMBOCOR) 100 MG tablet Take 50 mg by mouth 2 (two) times daily.    . furosemide (LASIX) 40 MG tablet Take 1 tablet (40  mg total) by mouth daily. Will take extra 25m PRN for swelling (Patient taking differently: Take 40 mg by mouth See admin instructions. Take 1 tablet (40 mg) by mouth daily, may also take extra tablet (471m as needed for ankle/ leg swelling) 30 tablet   . levothyroxine (SYNTHROID, LEVOTHROID) 88 MCG tablet Take 88 mcg by mouth daily before breakfast.     . lisinopril (PRINIVIL,ZESTRIL) 20 MG tablet Take 1 tablet (20 mg total) by mouth 2 (two) times daily. (Patient taking differently: Take 20 mg by mouth daily. ) 60 tablet 6  . warfarin (COUMADIN) 5 MG tablet Take 2.5-5 mg by mouth See admin instructions. Take 1 tablet (5 mg) by mouth on Monday and Friday after supper, take 1/2 tablet (2.5 mg) on Sunday, Tuesday, Wednesday, Thursday, Saturday with supper      Blood pressure 135/68, pulse 86, temperature 98.4 F (36.9 C), temperature source Oral, resp. rate 14, height '5\' 4"'  (1.626 m), weight 186 lb 8.2 oz (84.6 kg), SpO2 95 %.  Physical Exam  Constitutional: She is oriented to person, place, and time and well-developed, well-nourished, and in no distress. Vital signs are normal. No distress.  HENT:  Head: Normocephalic and atraumatic.  Nose: Nose normal.  Mouth/Throat: Oropharynx is clear and moist. No oropharyngeal exudate.  Eyes: Conjunctivae are normal. Pupils are equal, round, and reactive to light. Right eye exhibits no discharge. Left eye exhibits no discharge. No scleral icterus.  Neck: Normal range of motion. Neck supple. No thyromegaly present.  Cardiovascular: Normal rate, regular rhythm, normal heart sounds and intact distal pulses. Exam reveals no gallop and no friction rub.  No murmur heard. Pulses:      Radial pulses are 2+ on the right side, and 2+ on the left side.       Dorsalis pedis pulses are 2+ on the right side, and 2+ on the left side.  Pulmonary/Chest: Effort normal and breath sounds normal. No respiratory distress. She has no decreased breath sounds. She has no wheezes.  She has no rhonchi. She has no rales.  Abdominal: Soft. Bowel sounds are normal. She exhibits no distension and no mass. There is no tenderness. There is no rebound and no guarding.  Musculoskeletal: She exhibits edema, tenderness and deformity.  Right arm with significant swelling, erythema, and warmth. Pain with palpation of anterior arm and axilla region, did not assess ROM of RUE. Good AROM of LUE  Lymphadenopathy:       Head (right side): Submental, submandibular, preauricular and occipital adenopathy present.       Head (left side): Submental, submandibular and preauricular adenopathy present. No occipital adenopathy present.    She has cervical adenopathy.    She has axillary adenopathy.  Neurological: She is alert and oriented to person, place, and time. GCS score is 15.  Skin: Skin is warm and dry. She is not diaphoretic.  Psychiatric: Mood and affect normal.  Nursing note and vitals reviewed.   Results for orders  placed or performed during the hospital encounter of 11/29/17 (from the past 48 hour(s))  Heparin level (unfractionated)     Status: Abnormal   Collection Time: 11/30/17 10:53 PM  Result Value Ref Range   Heparin Unfractionated <0.10 (L) 0.30 - 0.70 IU/mL    Comment:        IF HEPARIN RESULTS ARE BELOW EXPECTED VALUES, AND PATIENT DOSAGE HAS BEEN CONFIRMED, SUGGEST FOLLOW UP TESTING OF ANTITHROMBIN III LEVELS.   Protime-INR     Status: Abnormal   Collection Time: 12/01/17  5:27 AM  Result Value Ref Range   Prothrombin Time 31.7 (H) 11.4 - 15.2 seconds   INR 3.10   Heparin level (unfractionated)     Status: Abnormal   Collection Time: 12/01/17  5:27 AM  Result Value Ref Range   Heparin Unfractionated 0.14 (L) 0.30 - 0.70 IU/mL    Comment:        IF HEPARIN RESULTS ARE BELOW EXPECTED VALUES, AND PATIENT DOSAGE HAS BEEN CONFIRMED, SUGGEST FOLLOW UP TESTING OF ANTITHROMBIN III LEVELS.   CBC with Differential/Platelet     Status: Abnormal   Collection Time:  12/01/17  7:00 AM  Result Value Ref Range   WBC 7.0 4.0 - 10.5 K/uL   RBC 3.22 (L) 3.87 - 5.11 MIL/uL   Hemoglobin 11.0 (L) 12.0 - 15.0 g/dL   HCT 33.0 (L) 36.0 - 46.0 %   MCV 102.5 (H) 78.0 - 100.0 fL   MCH 34.2 (H) 26.0 - 34.0 pg   MCHC 33.3 30.0 - 36.0 g/dL   RDW 14.4 11.5 - 15.5 %   Platelets 241 150 - 400 K/uL   Neutrophils Relative % 75 %   Neutro Abs 5.2 1.7 - 7.7 K/uL   Lymphocytes Relative 14 %   Lymphs Abs 1.0 0.7 - 4.0 K/uL   Monocytes Relative 10 %   Monocytes Absolute 0.7 0.1 - 1.0 K/uL   Eosinophils Relative 1 %   Eosinophils Absolute 0.1 0.0 - 0.7 K/uL   Basophils Relative 0 %   Basophils Absolute 0.0 0.0 - 0.1 K/uL  Comprehensive metabolic panel     Status: Abnormal   Collection Time: 12/01/17  7:00 AM  Result Value Ref Range   Sodium 134 (L) 135 - 145 mmol/L   Potassium 4.2 3.5 - 5.1 mmol/L   Chloride 99 (L) 101 - 111 mmol/L   CO2 24 22 - 32 mmol/L   Glucose, Bld 79 65 - 99 mg/dL   BUN 23 (H) 6 - 20 mg/dL   Creatinine, Ser 0.80 0.44 - 1.00 mg/dL   Calcium 9.2 8.9 - 10.3 mg/dL   Total Protein 5.4 (L) 6.5 - 8.1 g/dL   Albumin 2.9 (L) 3.5 - 5.0 g/dL   AST 45 (H) 15 - 41 U/L   ALT 21 14 - 54 U/L   Alkaline Phosphatase 60 38 - 126 U/L   Total Bilirubin 0.5 0.3 - 1.2 mg/dL   GFR calc non Af Amer >60 >60 mL/min   GFR calc Af Amer >60 >60 mL/min    Comment: (NOTE) The eGFR has been calculated using the CKD EPI equation. This calculation has not been validated in all clinical situations. eGFR's persistently <60 mL/min signify possible Chronic Kidney Disease.    Anion gap 11 5 - 15  Magnesium     Status: None   Collection Time: 12/01/17  7:00 AM  Result Value Ref Range   Magnesium 1.8 1.7 - 2.4 mg/dL  Procalcitonin  Status: None   Collection Time: 12/01/17  7:00 AM  Result Value Ref Range   Procalcitonin <0.10 ng/mL    Comment:        Interpretation: PCT (Procalcitonin) <= 0.5 ng/mL: Systemic infection (sepsis) is not likely. Local bacterial  infection is possible. (NOTE)       Sepsis PCT Algorithm           Lower Respiratory Tract                                      Infection PCT Algorithm    ----------------------------     ----------------------------         PCT < 0.25 ng/mL                PCT < 0.10 ng/mL         Strongly encourage             Strongly discourage   discontinuation of antibiotics    initiation of antibiotics    ----------------------------     -----------------------------       PCT 0.25 - 0.50 ng/mL            PCT 0.10 - 0.25 ng/mL               OR       >80% decrease in PCT            Discourage initiation of                                            antibiotics      Encourage discontinuation           of antibiotics    ----------------------------     -----------------------------         PCT >= 0.50 ng/mL              PCT 0.26 - 0.50 ng/mL               AND        <80% decrease in PCT             Encourage initiation of                                             antibiotics       Encourage continuation           of antibiotics    ----------------------------     -----------------------------        PCT >= 0.50 ng/mL                  PCT > 0.50 ng/mL               AND         increase in PCT                  Strongly encourage                                      initiation of antibiotics    Strongly  encourage escalation           of antibiotics                                     -----------------------------                                           PCT <= 0.25 ng/mL                                                 OR                                        > 80% decrease in PCT                                     Discontinue / Do not initiate                                             antibiotics   Lactate dehydrogenase     Status: Abnormal   Collection Time: 12/01/17  1:19 PM  Result Value Ref Range   LDH 459 (H) 98 - 192 U/L  Procalcitonin     Status: None   Collection Time:  12/02/17  5:31 AM  Result Value Ref Range   Procalcitonin <0.10 ng/mL    Comment:        Interpretation: PCT (Procalcitonin) <= 0.5 ng/mL: Systemic infection (sepsis) is not likely. Local bacterial infection is possible. (NOTE)       Sepsis PCT Algorithm           Lower Respiratory Tract                                      Infection PCT Algorithm    ----------------------------     ----------------------------         PCT < 0.25 ng/mL                PCT < 0.10 ng/mL         Strongly encourage             Strongly discourage   discontinuation of antibiotics    initiation of antibiotics    ----------------------------     -----------------------------       PCT 0.25 - 0.50 ng/mL            PCT 0.10 - 0.25 ng/mL               OR       >80% decrease in PCT            Discourage initiation of  antibiotics      Encourage discontinuation           of antibiotics    ----------------------------     -----------------------------         PCT >= 0.50 ng/mL              PCT 0.26 - 0.50 ng/mL               AND        <80% decrease in PCT             Encourage initiation of                                             antibiotics       Encourage continuation           of antibiotics    ----------------------------     -----------------------------        PCT >= 0.50 ng/mL                  PCT > 0.50 ng/mL               AND         increase in PCT                  Strongly encourage                                      initiation of antibiotics    Strongly encourage escalation           of antibiotics                                     -----------------------------                                           PCT <= 0.25 ng/mL                                                 OR                                        > 80% decrease in PCT                                     Discontinue / Do not initiate                                              antibiotics   Protime-INR     Status: Abnormal   Collection Time: 12/02/17  5:31 AM  Result Value Ref Range   Prothrombin Time 20.4 (H)  11.4 - 15.2 seconds   INR 1.76    Mr Neck Soft Tissue Only W Wo Contrast  Result Date: 11/30/2017 CLINICAL DATA:  Rapidly progressive lymphadenopathy, RIGHT arm swelling since Thanksgiving. Suspected lymphoma. Status post biopsy, results pending. EXAM: MRI OF THE NECK WITH CONTRAST TECHNIQUE: Multiplanar, multisequence MR imaging was performed following the administration of intravenous contrast. CONTRAST:  15m MULTIHANCE GADOBENATE DIMEGLUMINE 529 MG/ML IV SOLN COMPARISON:  CT neck November 29, 2017 and CT neck November 24, 2017 FINDINGS: Moderately motion degraded examination. PHARYNX AND LARYNX: Normal.  Widely patent airway. SALIVARY GLANDS: Intraparotid lymph nodes bilaterally measuring to 17 mm in deep lobe. No definite primary parotid mass though limited by motion. THYROID: Normal. LYMPH NODES: Severe RIGHT greater than LEFT cervical lymphadenopathy with intermediate to bright T2 signal and, mild enhancement. Lymphadenopathy all levels of the neck, at least 8.4 x 4.2 cm RIGHT supraclavicular nodal conglomeration. Lymphadenopathy abuts the scalene muscles impinges upon the RIGHT brachials plexus. Limited assessment of the brachium plexus due to motion. Massive LEFT retropectoral lymphadenopathy, lesser extent on the RIGHT. VASCULAR: Normal flow-voids. LIMITED INTRACRANIAL: Normal. VISUALIZED ORBITS: Normal. MASTOIDS AND VISUALIZED PARANASAL SINUSES: Well-aerated. SKELETON: No abnormal bone marrow signal though not tailored for evaluation. Limited assessment of spinal cord due to motion, no suspicious intracanalicular enhancement. UPPER CHEST: Lung apices are clear. No superior mediastinal lymphadenopathy. OTHER: Susceptibility artifact LEFT neck corresponding to use surgical clips. IMPRESSION: 1. Moderately motion degraded examination. 2. Bulky cervical and included  chest lymphadenopathy with RIGHT brachial plexus impingement. Electronically Signed   By: CElon AlasM.D.   On: 11/30/2017 22:16      Assessment/Plan Active Problems:   Hypoxia   Lymphadenopathy   Acute cystitis without hematuria   Lactic acidosis  Lymphadenopathy - we were consulted for biopsy of R axilla. IR is performing a biopsy today. If we are needed please page [email or web address] We will sign off at this time.   Thank you  JKalman Drape [facility] 12/02/2017, 12:53 PM Pager: [phone]: [phone]-Fri 7:00 am-4:30 pm Sat-Sun 7:00 am-11:30 am

## 2017-12-03 DIAGNOSIS — M5410 Radiculopathy, site unspecified: Secondary | ICD-10-CM

## 2017-12-03 DIAGNOSIS — R0902 Hypoxemia: Secondary | ICD-10-CM

## 2017-12-03 DIAGNOSIS — C8591 Non-Hodgkin lymphoma, unspecified, lymph nodes of head, face, and neck: Secondary | ICD-10-CM

## 2017-12-03 DIAGNOSIS — C8580 Other specified types of non-Hodgkin lymphoma, unspecified site: Secondary | ICD-10-CM

## 2017-12-03 LAB — CBC
HEMATOCRIT: 31.2 % — AB (ref 36.0–46.0)
HEMOGLOBIN: 10.4 g/dL — AB (ref 12.0–15.0)
MCH: 34.4 pg — AB (ref 26.0–34.0)
MCHC: 33.3 g/dL (ref 30.0–36.0)
MCV: 103.3 fL — ABNORMAL HIGH (ref 78.0–100.0)
Platelets: 188 10*3/uL (ref 150–400)
RBC: 3.02 MIL/uL — ABNORMAL LOW (ref 3.87–5.11)
RDW: 14.7 % (ref 11.5–15.5)
WBC: 4.9 10*3/uL (ref 4.0–10.5)

## 2017-12-03 LAB — PROTIME-INR
INR: 1.45
PROTHROMBIN TIME: 17.5 s — AB (ref 11.4–15.2)

## 2017-12-03 LAB — HEPARIN LEVEL (UNFRACTIONATED)
Heparin Unfractionated: 0.26 IU/mL — ABNORMAL LOW (ref 0.30–0.70)
Heparin Unfractionated: 0.4 IU/mL (ref 0.30–0.70)

## 2017-12-03 MED ORDER — SODIUM CHLORIDE 0.9% FLUSH
10.0000 mL | INTRAVENOUS | Status: DC | PRN
  Administered 2017-12-04 – 2017-12-08 (×2): 10 mL
  Filled 2017-12-03 (×2): qty 40

## 2017-12-03 MED ORDER — ALLOPURINOL 300 MG PO TABS
300.0000 mg | ORAL_TABLET | Freq: Every day | ORAL | Status: DC
  Administered 2017-12-03 – 2017-12-09 (×7): 300 mg via ORAL
  Filled 2017-12-03 (×7): qty 1

## 2017-12-03 MED ORDER — SODIUM CHLORIDE 0.9 % IV SOLN
375.0000 mg/m2 | Freq: Once | INTRAVENOUS | Status: AC
  Administered 2017-12-04: 700 mg via INTRAVENOUS
  Filled 2017-12-03: qty 70

## 2017-12-03 MED ORDER — LISINOPRIL 10 MG PO TABS
20.0000 mg | ORAL_TABLET | Freq: Every day | ORAL | Status: DC
  Administered 2017-12-03 – 2017-12-09 (×7): 20 mg via ORAL
  Filled 2017-12-03 (×7): qty 2

## 2017-12-03 MED ORDER — GABAPENTIN 300 MG PO CAPS
300.0000 mg | ORAL_CAPSULE | Freq: Three times a day (TID) | ORAL | Status: DC
  Administered 2017-12-03 – 2017-12-09 (×20): 300 mg via ORAL
  Filled 2017-12-03 (×20): qty 1

## 2017-12-03 MED ORDER — PREDNISONE 20 MG PO TABS
60.0000 mg | ORAL_TABLET | Freq: Every day | ORAL | Status: DC
  Administered 2017-12-03 – 2017-12-08 (×6): 60 mg via ORAL
  Filled 2017-12-03 (×6): qty 3

## 2017-12-03 NOTE — Progress Notes (Signed)
Peripherally Inserted Central Catheter/Midline Placement  The IV Nurse has discussed with the patient and/or persons authorized to consent for the patient, the purpose of this procedure and the potential benefits and risks involved with this procedure.  The benefits include less needle sticks, lab draws from the catheter, and the patient may be discharged home with the catheter. Risks include, but not limited to, infection, bleeding, blood clot (thrombus formation), and puncture of an artery; nerve damage and irregular heartbeat and possibility to perform a PICC exchange if needed/ordered by physician.  Alternatives to this procedure were also discussed.  Bard Power PICC patient education guide, fact sheet on infection prevention and patient information card has been provided to patient /or left at bedside.    PICC/Midline Placement Documentation  PICC Double Lumen 53/79/43 PICC Left Cephalic 40 cm 0 cm (Active)  Indication for Insertion or Continuance of Line Limited venous access - need for IV therapy >5 days (PICC only) 12/03/2017  2:48 PM  Exposed Catheter (cm) 0 cm 12/03/2017  2:48 PM  Dressing Change Due 12/10/17 12/03/2017  2:48 PM     Consent obtained by Adele Barthel M 12/03/2017, 2:50 PM

## 2017-12-03 NOTE — Progress Notes (Signed)
Assessed  the pt for a midline it was determined that a PICC would be best for this patient. The nurse will get a PICC order from the MD and the midline order will be discontinued. [employer]

## 2017-12-03 NOTE — Progress Notes (Signed)
CHE BELOW   DOB:[birth year]   OV#:[id number]   XAJ#:[id number]  Subjective:  Continues in significant arm pain; states pain meds help her back, not her arm; concerned re "20 lb weight gain;" no cough, SOB; bdbound; husband in room   Objective: older White woman examined in bed Vitals:   12/03/17 0000 12/03/17 0525  BP: 138/63 (!) 145/75  Pulse: 82 83  Resp: 13 17  Temp: 98.2 F (36.8 C) 98.3 F (36.8 C)  SpO2: 96% 97%    Body mass index is 31.71 kg/m.  Intake/Output Summary (Last 24 hours) at 12/03/2017 0736 Last data filed at 12/03/2017 0618 Gross per 24 hour  Intake 1400 ml  Output 900 ml  Net 500 ml    CBG (last 3)  No results for input(s): GLUCAP in the last 72 hours.   Labs:  Lab Results  Component Value Date   WBC 4.9 12/03/2017   HGB 10.4 (L) 12/03/2017   HCT 31.2 (L) 12/03/2017   MCV 103.3 (H) 12/03/2017   PLT 188 12/03/2017   NEUTROABS 5.2 12/01/2017    @LASTCHEMISTRY @  Urine Studies No results for input(s): UHGB, CRYS in the last 72 hours.  Invalid input(s): UACOL, UAPR, USPG, UPH, UTP, UGL, UKET, UBIL, UNIT, UROB, ULEU, UEPI, UWBC, [place], [place], CAST, [place], [place]  Basic Metabolic Panel: Recent Labs  Lab 11/29/17 1756 11/30/17 0239 12/01/17 0700  NA 133* 137 134*  K 4.3 4.9 4.2  CL 98* 99* 99*  CO2 20* 23 24  GLUCOSE 101* 113* 79  BUN 14 15 23*  CREATININE 1.06* 0.91 0.80  CALCIUM 10.8* 10.3 9.2  MG  --  1.4* 1.8  PHOS  --  4.3  --    GFR Estimated Creatinine Clearance: 58.7 mL/min (by C-G formula based on SCr of 0.8 mg/dL). Liver Function Tests: Recent Labs  Lab 11/29/17 1756 12/01/17 0700  AST 53* 45*  ALT 22 21  ALKPHOS 74 60  BILITOT 0.6 0.5  PROT 6.4* 5.4*  ALBUMIN 3.4* 2.9*   No results for input(s): LIPASE, AMYLASE in the last 168 hours. No results for input(s): AMMONIA in the last 168 hours. Coagulation profile Recent Labs  Lab 11/29/17 1756 11/30/17 0239 12/01/17 0527 12/02/17 0531 12/03/17 0209  INR 1.84  1.93 3.10 1.76 1.45    CBC: Recent Labs  Lab 11/28/17 0556 11/29/17 1756 11/30/17 0239 12/01/17 0700 12/03/17 0209  WBC 5.4 7.7 4.6 7.0 4.9  NEUTROABS  --  6.1  --  5.2  --   HGB 12.1 12.8 11.4* 11.0* 10.4*  HCT 36.4 38.3 33.8* 33.0* 31.2*  MCV 101.7* 103.0* 102.1* 102.5* 103.3*  PLT 258 291 227 241 188   Cardiac Enzymes: No results for input(s): CKTOTAL, CKMB, CKMBINDEX, TROPONINI in the last 168 hours. BNP: Invalid input(s): POCBNP CBG: No results for input(s): GLUCAP in the last 168 hours. D-Dimer No results for input(s): DDIMER in the last 72 hours. Hgb A1c No results for input(s): HGBA1C in the last 72 hours. Lipid Profile No results for input(s): CHOL, HDL, LDLCALC, TRIG, CHOLHDL, LDLDIRECT in the last 72 hours. Thyroid function studies No results for input(s): TSH, T4TOTAL, T3FREE, THYROIDAB in the last 72 hours.  Invalid input(s): FREET3 Anemia work up No results for input(s): VITAMINB12, FOLATE, FERRITIN, TIBC, IRON, RETICCTPCT in the last 72 hours. Microbiology Recent Results (from the past 240 hour(s))  Culture, blood (routine x 2)     Status: None (Preliminary result)   Collection Time: 11/30/17 12:15 AM  Result  Value Ref Range Status   Specimen Description BLOOD LEFT HAND  Final   Special Requests   Final    BOTTLES DRAWN AEROBIC ONLY Blood Culture adequate volume   Culture NO GROWTH 2 DAYS  Final   Report Status PENDING  Incomplete  Culture, blood (routine x 2)     Status: None (Preliminary result)   Collection Time: 11/30/17 12:25 AM  Result Value Ref Range Status   Specimen Description BLOOD LEFT WRIST  Final   Special Requests   Final    BOTTLES DRAWN AEROBIC ONLY Blood Culture adequate volume   Culture NO GROWTH 2 DAYS  Final   Report Status PENDING  Incomplete      Studies:  [place] Core Biopsy (lymph Nodes)  Result Date: 12/02/2017 INDICATION: 82 year old female with presumed lymphoma. She presents for biopsy of nodal mass of the left  axilla. EXAM: ULTRASOUND-GUIDED LYMPH NODE BIOPSY MEDICATIONS: None. ANESTHESIA/SEDATION: Moderate (conscious) sedation was employed during this procedure. A total of Versed 2.0 mg and Fentanyl 50 mcg was administered intravenously. Moderate Sedation Time: 10 minutes. The patient's level of consciousness and vital signs were monitored continuously by radiology nursing throughout the procedure under my direct supervision. FLUOROSCOPY TIME:  None COMPLICATIONS: None PROCEDURE: Informed written consent was obtained from the patient after a thorough discussion of the procedural risks, benefits and alternatives. All questions were addressed. Maximal Sterile Barrier Technique was utilized including caps, mask, sterile gowns, sterile gloves, sterile drape, hand hygiene and skin antiseptic. A timeout was performed prior to the initiation of the procedure. Patient positioned supine position on the ultrasound stretcher. Ultrasound images of the left axillary region were performed with images stored and sent to PACs. The patient is prepped and draped in the usual sterile fashion. The skin and subcutaneous tissues were generously infiltrated 1% lidocaine for local anesthesia. Using ultrasound guidance, at least 8 separate 18 gauge core biopsy of the left axillary node were performed. Specimen placed in the saline. Final image was stored. Patient tolerated the procedure well and remained hemodynamically stable throughout. No complications were encountered and no significant blood loss. IMPRESSION: Status post ultrasound-guided biopsy of left axillary mass. Tissue specimen sent to pathology for complete histopathologic analysis. Signed, Dulcy Fanny. Earleen Newport, DO [facility] Electronically Signed   By: Corrie Mckusick D.O.   On: 12/02/2017 15:51    Assessment: 82 y.o. [place] woman with rapidly developing lymphadenopathy involving chest, neck, axillae, and mediastinum;  elevated LDH, other markers pending; biopsy of [place] LN 11/28/2017 results pending  (a) because tissue from 1207/2918 described as "scant" additional core BXs of Left axillary LN obtained 12/02/2017  (b) LDH elevated; other markers still pending  Plan:  Hopefully we will get some news from path today. Tumor markers should also be coming in.  Will add gabapentin and see if that helps the arm pain.   Will follow closely with you.   Chauncey Cruel, MD 12/03/2017  7:36 AM Medical Oncology and Hematology [facility] [address] [place], [place] [address] Tel. [phone]    Fax. [phone]

## 2017-12-03 NOTE — Progress Notes (Signed)
Assessed patient this morning and the pt. right arm is swollen and painful. Patient complains of not being able to move arm and fingers. Patient has sensation in fingers but can't move extremity due to pain. Patient neck appears swollen but patient denies feeling short of breath and has the ability to swallow. Patient complains of pain in her back. Gave patient pain medication at her request. Notified physician of swelling in right arm/fingers and neck.

## 2017-12-03 NOTE — Progress Notes (Signed)
PROGRESS NOTE  Colleen [name] ZOX:[id number] DOB: [birth year] DOA: 11/29/2017 PCP: Ann Held, DO  HPI/Recap of past 24 hours: Pt. with PMH of A. fib on Coumadin, CHF, HTN, hypothyroidism, CVA; admitted on 11/29/2017, presented with complaint of right arm pain and swelling, SOB PTA, was found to have brachial neuritis with rapidly developing diffuse lymphadenopathy involving neck, axillae, mediastinum. Lymph node biopsy done on 12/02/17 showed likely diffuse large B-cell non Hodgkin's lymphoma. Hematology on board, started on pred and allopurinol 12/03/17 and plans for rituximab on 12/04/17.  Today, patient still reported persistent pain and swelling in R arm, as well as L arm swelling. Pt is not in any resp distress and denies any difficulty breathing. Pt denies any chest pain, SOB, abdominal pain, fever/chills.  Assessment/Plan: Active Problems:   Hypoxia   Lymphadenopathy   Acute cystitis without hematuria   Lactic acidosis   Malignant lymphomas of lymph nodes of head, face, and neck (HCC)  #Diffuse large B-cell non Hodgkin's Lymphoma New onset, LN biopsy on 12/02/17 + R arm pain, swelling, diffuse lymphadenopathy on presentation CT scan chest 11/24/2017 shows lymphadenopathy MRI neck shows brachial plexus impingement likely brachial neuritis from lymphadenopathy Hem/onc on board, Dr Jana Hakim reviewed slides with pathology from 12/02/17 which showed diffuse large B cell NHL Start Pred and allopurinol on 12/03/17 and rituximab on 12/04/17 Arm sling, as needed narcotics for pain management for now Monitor closely for tumor lysis syndrome Continue IVF  #Concern for airway compromise with hypoxia. Stable, no resp distress Initially presented with hypoxia with midline shift of the trachea due to bulky lymphadenopathy CCM was consulted, recommended stepdown admission  Monitor very closely, continuous pulse ox  #Chronic A. Fib. Rate controlled On Coumadin, held On  admission started on IV heparin, due to LN biopsy, held coumadin Continue IV heparin for now, plan to restart coumadin once pt is more stable Continue flecainide.  #Mild hypercalcemia Resolved Continue IV fluids Monitor  #UTI U/A showed mod leuko, pos nitrite, many bacteria UC never sent, will send out Continue rocephin for 5 days total  #Lactic acidosis 7.91->4.9 Likely due to aggressive lymphoma rather than hypoperfusion No signs of sepsis Continue IVF  #Essential hypertension BP controlled Monitor  #Hypothyroidism Continue Synthroid     Code Status: Full  Family Communication: Discussed with patient and husband at bedside  Disposition Plan: Once stable   Consultants:  Hem/onc  PCCM  IR  GS  Procedures:  IR guided needle biopsy   Antimicrobials:  IV rocephin  DVT prophylaxis: IV heparin   Objective: Vitals:   12/03/17 0525 12/03/17 0802 12/03/17 0823 12/03/17 0827  BP: (!) 145/75 137/61    Pulse: 83 83    Resp: 17 16    Temp: 98.3 F (36.8 C) 97.7 F (36.5 C)    TempSrc: Oral Oral    SpO2: 97% 97% 97% 98%  Weight: 83.8 kg (184 lb 11.9 oz)     Height:        Intake/Output Summary (Last 24 hours) at 12/03/2017 1057 Last data filed at 12/03/2017 0618 Gross per 24 hour  Intake 1280 ml  Output 400 ml  Net 880 ml   Filed Weights   12/01/17 0455 12/02/17 0324 12/03/17 0525  Weight: 80.1 kg (176 lb 9.4 oz) 84.6 kg (186 lb 8.2 oz) 83.8 kg (184 lb 11.9 oz)    Exam:   General: Alert, awake, oriented x3, no distress  Cardiovascular: S1-S2 present, no added heart sound  Respiratory: Chest clear  bilaterally  Abdomen: Soft, nontender, nondistended, bowel sounds present  Musculoskeletal: Bilateral arm swelling, worse on the right.  No pedal edema, no calf tenderness  Skin: No rashes noted  Psychiatry: Normal mood   Data Reviewed: CBC: Recent Labs  Lab 11/28/17 0556 11/29/17 1756 11/30/17 0239 12/01/17 0700  12/03/17 0209  WBC 5.4 7.7 4.6 7.0 4.9  NEUTROABS  --  6.1  --  5.2  --   HGB 12.1 12.8 11.4* 11.0* 10.4*  HCT 36.4 38.3 33.8* 33.0* 31.2*  MCV 101.7* 103.0* 102.1* 102.5* 103.3*  PLT 258 291 227 241 672   Basic Metabolic Panel: Recent Labs  Lab 11/29/17 1756 11/30/17 0239 12/01/17 0700  NA 133* 137 134*  K 4.3 4.9 4.2  CL 98* 99* 99*  CO2 20* 23 24  GLUCOSE 101* 113* 79  BUN 14 15 23*  CREATININE 1.06* 0.91 0.80  CALCIUM 10.8* 10.3 9.2  MG  --  1.4* 1.8  PHOS  --  4.3  --    GFR: Estimated Creatinine Clearance: 58.7 mL/min (by C-G formula based on SCr of 0.8 mg/dL). Liver Function Tests: Recent Labs  Lab 11/29/17 1756 12/01/17 0700  AST 53* 45*  ALT 22 21  ALKPHOS 74 60  BILITOT 0.6 0.5  PROT 6.4* 5.4*  ALBUMIN 3.4* 2.9*   No results for input(s): LIPASE, AMYLASE in the last 168 hours. No results for input(s): AMMONIA in the last 168 hours. Coagulation Profile: Recent Labs  Lab 11/29/17 1756 11/30/17 0239 12/01/17 0527 12/02/17 0531 12/03/17 0209  INR 1.84 1.93 3.10 1.76 1.45   Cardiac Enzymes: No results for input(s): CKTOTAL, CKMB, CKMBINDEX, TROPONINI in the last 168 hours. BNP (last 3 results) No results for input(s): PROBNP in the last 8760 hours. HbA1C: No results for input(s): HGBA1C in the last 72 hours. CBG: No results for input(s): GLUCAP in the last 168 hours. Lipid Profile: No results for input(s): CHOL, HDL, LDLCALC, TRIG, CHOLHDL, LDLDIRECT in the last 72 hours. Thyroid Function Tests: No results for input(s): TSH, T4TOTAL, FREET4, T3FREE, THYROIDAB in the last 72 hours. Anemia Panel: No results for input(s): VITAMINB12, FOLATE, FERRITIN, TIBC, IRON, RETICCTPCT in the last 72 hours. Urine analysis:    Component Value Date/Time   COLORURINE YELLOW 11/29/2017 2145   APPEARANCEUR CLEAR 11/29/2017 2145   LABSPEC 1.040 (H) 11/29/2017 2145   PHURINE 5.0 11/29/2017 2145   GLUCOSEU NEGATIVE 11/29/2017 2145   HGBUR SMALL (A) 11/29/2017  2145   BILIRUBINUR NEGATIVE 11/29/2017 2145   KETONESUR NEGATIVE 11/29/2017 2145   PROTEINUR NEGATIVE 11/29/2017 2145   UROBILINOGEN 0.2 08/11/2015 1244   NITRITE POSITIVE (A) 11/29/2017 2145   LEUKOCYTESUR MODERATE (A) 11/29/2017 2145   Sepsis Labs: @LABRCNTIP (procalcitonin:4,lacticidven:4)  ) Recent Results (from the past 240 hour(s))  Culture, blood (routine x 2)     Status: None (Preliminary result)   Collection Time: 11/30/17 12:15 AM  Result Value Ref Range Status   Specimen Description BLOOD LEFT HAND  Final   Special Requests   Final    BOTTLES DRAWN AEROBIC ONLY Blood Culture adequate volume   Culture NO GROWTH 2 DAYS  Final   Report Status PENDING  Incomplete  Culture, blood (routine x 2)     Status: None (Preliminary result)   Collection Time: 11/30/17 12:25 AM  Result Value Ref Range Status   Specimen Description BLOOD LEFT WRIST  Final   Special Requests   Final    BOTTLES DRAWN AEROBIC ONLY Blood Culture adequate volume  Culture NO GROWTH 2 DAYS  Final   Report Status PENDING  Incomplete      Studies: [place] Core Biopsy (lymph Nodes)  Result Date: 12/02/2017 INDICATION: 82 year old female with presumed lymphoma. She presents for biopsy of nodal mass of the left axilla. EXAM: ULTRASOUND-GUIDED LYMPH NODE BIOPSY MEDICATIONS: None. ANESTHESIA/SEDATION: Moderate (conscious) sedation was employed during this procedure. A total of Versed 2.0 mg and Fentanyl 50 mcg was administered intravenously. Moderate Sedation Time: 10 minutes. The patient's level of consciousness and vital signs were monitored continuously by radiology nursing throughout the procedure under my direct supervision. FLUOROSCOPY TIME:  None COMPLICATIONS: None PROCEDURE: Informed written consent was obtained from the patient after a thorough discussion of the procedural risks, benefits and alternatives. All questions were addressed. Maximal Sterile Barrier Technique was utilized including caps, mask, sterile  gowns, sterile gloves, sterile drape, hand hygiene and skin antiseptic. A timeout was performed prior to the initiation of the procedure. Patient positioned supine position on the ultrasound stretcher. Ultrasound images of the left axillary region were performed with images stored and sent to PACs. The patient is prepped and draped in the usual sterile fashion. The skin and subcutaneous tissues were generously infiltrated 1% lidocaine for local anesthesia. Using ultrasound guidance, at least 8 separate 18 gauge core biopsy of the left axillary node were performed. Specimen placed in the saline. Final image was stored. Patient tolerated the procedure well and remained hemodynamically stable throughout. No complications were encountered and no significant blood loss. IMPRESSION: Status post ultrasound-guided biopsy of left axillary mass. Tissue specimen sent to pathology for complete histopathologic analysis. Signed, Dulcy Fanny. Earleen Newport, DO [facility] Radiology Electronically Signed   By: Corrie Mckusick D.O.   On: 12/02/2017 15:51    Scheduled Meds: . allopurinol  300 mg Oral Daily  . arformoterol  15 mcg Nebulization BID  . budesonide (PULMICORT) nebulizer solution  0.25 mg Nebulization BID  . flecainide  50 mg Oral BID  . gabapentin  300 mg Oral TID  . levothyroxine  88 mcg Oral QAC breakfast  . polyethylene glycol  17 g Oral Daily  . predniSONE  60 mg Oral Q breakfast  . [START ON 12/04/2017] riTUXimab (RITUXAN) IV infusion  375 mg/m2 Intravenous Once  . senna-docusate  1 tablet Oral QHS    Continuous Infusions: . sodium chloride 100 mL/hr at 12/02/17 1730  . cefTRIAXone (ROCEPHIN)  IV Stopped (12/02/17 2220)  . heparin 1,150 Units/hr (12/03/17 0310)     LOS: 3 days     Alma Friendly, MD [facility]  If 7PM-7AM, please contact night-coverage [email or web address] Password [facility] 12/03/2017, 10:57 AM

## 2017-12-03 NOTE — Progress Notes (Signed)
ADDENDUM: Have reviewed slides with pathology today. We are dealing with a B-cell non-Hodgkin's lymphoma which is aggressive, but non-Burkitt's-- basically a diffuse large cell NHL. We have enough material for definitive diagnosis so may start treatment.  I will write for prednisone and allopurinol today, and for rituximab to start tomorrow. Because of the high tumor burden there is a possibility of tumor lysis and she will need close monitoring of C-Met, phosphorus, uric acid and LDH. Given her age and comorbidities, standard CHOP likely not best option. Will consider bendamustine.  I have discussed this with the patient and will come by again tomorrow AM for further discussion and treatment planning.

## 2017-12-03 NOTE — Progress Notes (Signed)
[place] for Heparin (warfarin on hold) Indication: atrial fibrillation  No Known Allergies  Patient Measurements: Height: 5\' 4"  (162.6 cm) Weight: 186 lb 8.2 oz (84.6 kg) IBW/kg (Calculated) : 54.7  Heparin dosing weight = 70.5 kg  Vital Signs: Temp: 98.2 F (36.8 C) (12/12 0000) Temp Source: Oral (12/12 0000) BP: 138/63 (12/12 0000) Pulse Rate: 82 (12/12 0000)  Labs: Recent Labs    12/01/17 0527 12/01/17 0700 12/02/17 0531 12/02/17 1605 12/03/17 0209  HGB  --  11.0*  --   --  10.4*  HCT  --  33.0*  --   --  31.2*  PLT  --  241  --   --  188  LABPROT 31.7*  --  20.4*  --  17.5*  INR 3.10  --  1.76  --  1.45  HEPARINUNFRC 0.14*  --   --  <0.10* 0.26*  CREATININE  --  0.80  --   --   --    Estimated Creatinine Clearance: 59.1 mL/min (by C-G formula based on SCr of 0.8 mg/dL).  Medical History: Past Medical History:  Diagnosis Date  . Atrial fibrillation ([place])   . Carotid artery disease ([place])   . CHF (congestive heart failure) ([place])   . Hyperlipidemia   . Hypertension   . Hypothyroidism   . Shortness of breath   . TIA (transient ischemic attack) 2001   Assessment: 82 y/o female admitted with lymphadenopathy requiring biopsy. On warfarin PTA for afib and on hold for biopsy. Pharmacy consulted to dose heparin .   Heparin level subtherapeutic at 0.26 and no infusion issues per RN. Hgb low-stable and plts downtrending slightly, although still within normal limits. No s/s bleeding per RN.   Goal of Therapy:  Heparin level 0.3-0.7 units/ml Monitor platelets by anticoagulation protocol: Yes   Plan:  Increase heparin gtt to 1150 units/hr  Heparin level in 8 hrs Daily heparin level and CBC  Monitor for s/s bleeding   Lavonda Jumbo, PharmD Clinical Pharmacist 12/03/17 3:05 AM

## 2017-12-03 NOTE — Progress Notes (Signed)
[place] for Heparin (warfarin on hold) Indication: atrial fibrillation  No Known Allergies  Patient Measurements: Height: 5\' 4"  (162.6 cm) Weight: 184 lb 11.9 oz (83.8 kg) IBW/kg (Calculated) : 54.7  Heparin dosing weight = 70.5 kg  Vital Signs: Temp: 97.7 F (36.5 C) (12/12 0802) Temp Source: Oral (12/12 0802) BP: 137/61 (12/12 0802) Pulse Rate: 83 (12/12 0802)  Labs: Recent Labs    12/01/17 0527 12/01/17 0700 12/02/17 0531 12/02/17 1605 12/03/17 0209 12/03/17 1127  HGB  --  11.0*  --   --  10.4*  --   HCT  --  33.0*  --   --  31.2*  --   PLT  --  241  --   --  188  --   LABPROT 31.7*  --  20.4*  --  17.5*  --   INR 3.10  --  1.76  --  1.45  --   HEPARINUNFRC 0.14*  --   --  <0.10* 0.26* 0.40  CREATININE  --  0.80  --   --   --   --    Estimated Creatinine Clearance: 58.7 mL/min (by C-G formula based on SCr of 0.8 mg/dL).  Medical History: Past Medical History:  Diagnosis Date  . Atrial fibrillation ([place])   . Carotid artery disease ([place])   . CHF (congestive heart failure) ([place])   . Hyperlipidemia   . Hypertension   . Hypothyroidism   . Shortness of breath   . TIA (transient ischemic attack) 2001   Assessment: 82 y/o female admitted with lymphadenopathy requiring biopsy. On warfarin PTA for afib and on hold for biopsy. Pharmacy consulted to dose heparin .  -heparin level is at goal after increase to 1150 units/hr   Goal of Therapy:  Heparin level 0.3-0.7 units/ml Monitor platelets by anticoagulation protocol: Yes   Plan:  Increase heparin gtt to 1150 units/hr  Daily heparin level and CBC   Hildred Laser, Pharm D 12/03/2017 12:43 PM

## 2017-12-03 NOTE — Progress Notes (Signed)
I spoke to the nurse and she informed the MD that a consent should be signed prior to administration. The IV team can administer Rituxan tomorrow at 12pm. Catalina Pizza

## 2017-12-03 NOTE — Progress Notes (Signed)
Called to bedside to evaluate pt's arm and neck swelling.    Pt was AAO x 4, in no acute distress, complaining of 3/10 pain in her R arm at rest.  Pronounced swelling present to R arm, cap refill brisk, radial pulse 3+, 2 + brachial.  Sensation of extremity was intact, movement was compromised by the extent of the swelling, able to move fingers and wrist.  The pt's airway was patent, lung sounds CTA bilaterally.  Swelling was present around the R shoulder and neck. There was no stridor or respiratory distress.  Pt denies any difficulty swallowing.  SpO2 94%.    I advised the primary RN, Jenny Reichmann, to call the MD for recommendations in regards to the pt's limited movement/swelling of her right arm.  Also recommended to reassess pulse, motor and sensory response of R arm throughout the shift.

## 2017-12-04 DIAGNOSIS — C8330 Diffuse large B-cell lymphoma, unspecified site: Secondary | ICD-10-CM

## 2017-12-04 LAB — COMPREHENSIVE METABOLIC PANEL
ALBUMIN: 2.4 g/dL — AB (ref 3.5–5.0)
ALK PHOS: 59 U/L (ref 38–126)
ALT: 22 U/L (ref 14–54)
AST: 36 U/L (ref 15–41)
Anion gap: 9 (ref 5–15)
BILIRUBIN TOTAL: 0.5 mg/dL (ref 0.3–1.2)
BUN: 14 mg/dL (ref 6–20)
CALCIUM: 10.6 mg/dL — AB (ref 8.9–10.3)
CO2: 25 mmol/L (ref 22–32)
CREATININE: 0.68 mg/dL (ref 0.44–1.00)
Chloride: 104 mmol/L (ref 101–111)
GFR calc Af Amer: >60 mL/min (ref >60)
GLUCOSE: 100 mg/dL — AB (ref 65–99)
Potassium: 4 mmol/L (ref 3.5–5.1)
Sodium: 138 mmol/L (ref 135–145)
TOTAL PROTEIN: 4.9 g/dL — AB (ref 6.5–8.1)

## 2017-12-04 LAB — CBC
HEMATOCRIT: 28.7 % — AB (ref 36.0–46.0)
HEMOGLOBIN: 9.6 g/dL — AB (ref 12.0–15.0)
MCH: 33.8 pg (ref 26.0–34.0)
MCHC: 33.4 g/dL (ref 30.0–36.0)
MCV: 101.1 fL — AB (ref 78.0–100.0)
Platelets: 178 10*3/uL (ref 150–400)
RBC: 2.84 MIL/uL — ABNORMAL LOW (ref 3.87–5.11)
RDW: 14.4 % (ref 11.5–15.5)
WBC: 3.8 10*3/uL — AB (ref 4.0–10.5)

## 2017-12-04 LAB — HEPARIN LEVEL (UNFRACTIONATED): HEPARIN UNFRACTIONATED: 0.46 [IU]/mL (ref 0.30–0.70)

## 2017-12-04 LAB — LACTATE DEHYDROGENASE: LDH: 408 U/L — AB (ref 98–192)

## 2017-12-04 LAB — PROTIME-INR
INR: 1.36
Prothrombin Time: 16.7 seconds — ABNORMAL HIGH (ref 11.4–15.2)

## 2017-12-04 LAB — URIC ACID: Uric Acid, Serum: 9.5 mg/dL — ABNORMAL HIGH (ref 2.3–6.6)

## 2017-12-04 MED ORDER — ACETAMINOPHEN 325 MG PO TABS
650.0000 mg | ORAL_TABLET | Freq: Once | ORAL | Status: AC
  Administered 2017-12-04: 650 mg via ORAL
  Filled 2017-12-04: qty 2

## 2017-12-04 MED ORDER — SODIUM CHLORIDE 0.9 % IV SOLN
6.0000 mg | Freq: Once | INTRAVENOUS | Status: AC
  Administered 2017-12-04: 6 mg via INTRAVENOUS
  Filled 2017-12-04: qty 4

## 2017-12-04 MED ORDER — OXYCODONE HCL 5 MG PO TABS
10.0000 mg | ORAL_TABLET | ORAL | Status: DC | PRN
  Administered 2017-12-04 – 2017-12-09 (×7): 10 mg via ORAL
  Filled 2017-12-04 (×7): qty 2

## 2017-12-04 MED ORDER — DIPHENHYDRAMINE HCL 25 MG PO CAPS
25.0000 mg | ORAL_CAPSULE | Freq: Once | ORAL | Status: AC
  Administered 2017-12-04: 25 mg via ORAL
  Filled 2017-12-04: qty 1

## 2017-12-04 MED ORDER — WARFARIN - PHARMACIST DOSING INPATIENT
Freq: Every day | Status: DC
  Administered 2017-12-04 – 2017-12-07 (×3)

## 2017-12-04 MED ORDER — WARFARIN SODIUM 5 MG PO TABS
5.0000 mg | ORAL_TABLET | Freq: Once | ORAL | Status: AC
  Administered 2017-12-04: 5 mg via ORAL
  Filled 2017-12-04: qty 1

## 2017-12-04 NOTE — Care Management Important Message (Signed)
Important Message  Patient Details  Name: Colleen [name] MRN: [id number] Date of Birth: 05/04/1935   Medicare Important Message Given:  Yes    Orbie Pyo 12/04/2017, 12:14 PM

## 2017-12-04 NOTE — Progress Notes (Signed)
I spoke to the nurse caring for this patient. The premeds have not been given at this time, I will administer the Rituxan 30 mins after the premeds are given. Catalina Pizza

## 2017-12-04 NOTE — Progress Notes (Signed)
Colleen [name]   DOB:[birth year]   XT#:[id number]   OEV#:[id number]  Subjective: Colleen [name] tells me the pain in the right arm is better today, day 2 of prednisone.  She is tolerating that drug with no side effects that she is aware of.  She still cannot use her right arm however.  No family in room   Objective: Elderly white woman examined in bed Vitals:   12/04/17 0000 12/04/17 0925  BP: (!) 102/52 (!) 147/89  Pulse: 79 93  Resp: 10 18  Temp:  97.9 F (36.6 C)  SpO2: 95% 100%    Body mass index is 31.71 kg/m.  Intake/Output Summary (Last 24 hours) at 12/04/2017 0930 Last data filed at 12/04/2017 0900 Gross per 24 hour  Intake 130 ml  Output 350 ml  Net -220 ml     Sclerae unicteric  No cervical or supraclavicular adenopathy  Lungs no rales or wheezes--auscultated anterolaterally  Heart regular rate and rhythm  Abdomen soft, +BS  Neuro nonfocal  Breast exam: Deferred  CBG (last 3)  No results for input(s): GLUCAP in the last 72 hours.   Labs:  Lab Results  Component Value Date   WBC 3.8 (L) 12/04/2017   HGB 9.6 (L) 12/04/2017   HCT 28.7 (L) 12/04/2017   MCV 101.1 (H) 12/04/2017   PLT 178 12/04/2017   NEUTROABS 5.2 12/01/2017    @LASTCHEMISTRY @  Urine Studies No results for input(s): UHGB, CRYS in the last 72 hours.  Invalid input(s): UACOL, UAPR, USPG, UPH, UTP, UGL, UKET, UBIL, UNIT, UROB, [place], UEPI, UWBC, [place], [place], [place], [place], [place]  Basic Metabolic Panel: Recent Labs  Lab 11/29/17 1756 11/30/17 0239 12/01/17 0700 12/04/17 0358  NA 133* 137 134* 138  K 4.3 4.9 4.2 4.0  CL 98* 99* 99* 104  CO2 20* 23 24 25   GLUCOSE 101* 113* 79 100*  BUN 14 15 23* 14  CREATININE 1.06* 0.91 0.80 0.68  CALCIUM 10.8* 10.3 9.2 10.6*  MG  --  1.4* 1.8  --   PHOS  --  4.3  --   --    GFR Estimated Creatinine Clearance: 58.7 mL/min (by C-G formula based on SCr of 0.68 mg/dL). Liver Function Tests: Recent Labs  Lab 11/29/17 1756 12/01/17 0700 12/04/17 0358  AST  53* 45* 36  ALT 22 21 22   ALKPHOS 74 60 59  BILITOT 0.6 0.5 0.5  PROT 6.4* 5.4* 4.9*  ALBUMIN 3.4* 2.9* 2.4*   No results for input(s): LIPASE, AMYLASE in the last 168 hours. No results for input(s): AMMONIA in the last 168 hours. Coagulation profile Recent Labs  Lab 11/30/17 0239 12/01/17 0527 12/02/17 0531 12/03/17 0209 12/04/17 0358  INR 1.93 3.10 1.76 1.45 1.36    CBC: Recent Labs  Lab 11/29/17 1756 11/30/17 0239 12/01/17 0700 12/03/17 0209 12/04/17 0358  WBC 7.7 4.6 7.0 4.9 3.8*  NEUTROABS 6.1  --  5.2  --   --   HGB 12.8 11.4* 11.0* 10.4* 9.6*  HCT 38.3 33.8* 33.0* 31.2* 28.7*  MCV 103.0* 102.1* 102.5* 103.3* 101.1*  PLT 291 227 241 188 178   Cardiac Enzymes: No results for input(s): CKTOTAL, CKMB, CKMBINDEX, TROPONINI in the last 168 hours. BNP: Invalid input(s): POCBNP CBG: No results for input(s): GLUCAP in the last 168 hours. D-Dimer No results for input(s): DDIMER in the last 72 hours. Hgb A1c No results for input(s): HGBA1C in the last 72 hours. Lipid Profile No results for input(s): CHOL, HDL, LDLCALC, TRIG, CHOLHDL, LDLDIRECT  in the last 72 hours. Thyroid function studies No results for input(s): TSH, T4TOTAL, T3FREE, THYROIDAB in the last 72 hours.  Invalid input(s): FREET3 Anemia work up No results for input(s): VITAMINB12, FOLATE, FERRITIN, TIBC, IRON, RETICCTPCT in the last 72 hours. Microbiology Recent Results (from the past 240 hour(s))  Culture, blood (routine x 2)     Status: None (Preliminary result)   Collection Time: 11/30/17 12:15 AM  Result Value Ref Range Status   Specimen Description BLOOD LEFT HAND  Final   Special Requests   Final    BOTTLES DRAWN AEROBIC ONLY Blood Culture adequate volume   Culture NO GROWTH 3 DAYS  Final   Report Status PENDING  Incomplete  Culture, blood (routine x 2)     Status: None (Preliminary result)   Collection Time: 11/30/17 12:25 AM  Result Value Ref Range Status   Specimen Description BLOOD  LEFT WRIST  Final   Special Requests   Final    BOTTLES DRAWN AEROBIC ONLY Blood Culture adequate volume   Culture NO GROWTH 3 DAYS  Final   Report Status PENDING  Incomplete      Studies:  [place] Core Biopsy (lymph Nodes)  Result Date: 12/02/2017 INDICATION: 82 year old female with presumed lymphoma. She presents for biopsy of nodal mass of the left axilla. EXAM: ULTRASOUND-GUIDED LYMPH NODE BIOPSY MEDICATIONS: None. ANESTHESIA/SEDATION: Moderate (conscious) sedation was employed during this procedure. A total of Versed 2.0 mg and Fentanyl 50 mcg was administered intravenously. Moderate Sedation Time: 10 minutes. The patient's level of consciousness and vital signs were monitored continuously by radiology nursing throughout the procedure under my direct supervision. FLUOROSCOPY TIME:  None COMPLICATIONS: None PROCEDURE: Informed written consent was obtained from the patient after a thorough discussion of the procedural risks, benefits and alternatives. All questions were addressed. Maximal Sterile Barrier Technique was utilized including caps, mask, sterile gowns, sterile gloves, sterile drape, hand hygiene and skin antiseptic. A timeout was performed prior to the initiation of the procedure. Patient positioned supine position on the ultrasound stretcher. Ultrasound images of the left axillary region were performed with images stored and sent to PACs. The patient is prepped and draped in the usual sterile fashion. The skin and subcutaneous tissues were generously infiltrated 1% lidocaine for local anesthesia. Using ultrasound guidance, at least 8 separate 18 gauge core biopsy of the left axillary node were performed. Specimen placed in the saline. Final image was stored. Patient tolerated the procedure well and remained hemodynamically stable throughout. No complications were encountered and no significant blood loss. IMPRESSION: Status post ultrasound-guided biopsy of left axillary mass. Tissue specimen  sent to pathology for complete histopathologic analysis. Signed, Dulcy Fanny. Earleen Newport, DO [facility] Radiology Electronically Signed   By: Corrie Mckusick D.O.   On: 12/02/2017 15:51    Assessment: 82 y.o. [place] woman with a diffuse large cell B-cell non-Hodgkin's lymphoma, high-grade, diagnosed by lymph node biopsy December 2018  (1) prednisone started 12/03/2017, to be continued for 5 days  (2) rituximab weekly to start 12/04/2017  (3) consider CHOP with infusional doxorubicin versus bendamustine  (4) at risk for tumor lysis   Plan: I discussed with Harly her new diagnosis of aggressive B-cell non-Hodgkin's lymphoma.  She is already having some response to prednisone.  Hopefully we can get the first dose of Rituxan in today without incident.  We discussed the possible toxicities, side effects and complications of that agent in detail today.  She signed a consent form which is  in the room.  Her uric acid is up.  We started allopurinol yesterday.  We will give a dose of rasburicase today.  We will follow her lab work closely over the next several days as she has a high tumor burden and is at risk for tumor lysis.  The overall plan is to move to either bendamustine or CHOP with infusional Adriamycin perhaps next week.  I would prefer to treat her on an outpatient basis, at the cancer center.  I will follow daily with you.    Chauncey Cruel, MD 12/04/2017  9:30 AM Medical Oncology and Hematology [facility] [address] [place], [place] [address] Tel. [phone]    Fax. [phone]

## 2017-12-04 NOTE — Progress Notes (Signed)
Notified of plan to administer Rotuxin at rate 22.9 ml/hr VTBI 11.4, 45.8 ml/hr, VTBI 22.9, and 68.7 ml/hr, VTBI remainder of infusion per Marianna Payment, IV team RN.

## 2017-12-04 NOTE — Progress Notes (Addendum)
[facility] (warfarin on hold) Indication: atrial fibrillation  No Known Allergies  Patient Measurements: Height: 5\' 4"  (162.6 cm) Weight: 184 lb 11.9 oz (83.8 kg) IBW/kg (Calculated) : 54.7  Heparin dosing weight = 70.5 kg  Vital Signs: Temp: 98.4 F (36.9 C) (12/12 2135) Temp Source: Oral (12/12 2135) BP: 102/52 (12/13 0000) Pulse Rate: 79 (12/13 0000)  Labs: Recent Labs    12/02/17 0531  12/03/17 0209 12/03/17 1127 12/04/17 0358  HGB  --   --  10.4*  --  9.6*  HCT  --   --  31.2*  --  28.7*  PLT  --   --  188  --  178  LABPROT 20.4*  --  17.5*  --  16.7*  INR 1.76  --  1.45  --  1.36  HEPARINUNFRC  --    < > 0.26* 0.40 0.46  CREATININE  --   --   --   --  0.68   < > = values in this interval not displayed.   Estimated Creatinine Clearance: 58.7 mL/min (by C-G formula based on SCr of 0.68 mg/dL).  Medical History: Past Medical History:  Diagnosis Date  . Atrial fibrillation ([place])   . Carotid artery disease ([place])   . CHF (congestive heart failure) ([place])   . Hyperlipidemia   . Hypertension   . Hypothyroidism   . Shortness of breath   . TIA (transient ischemic attack) 2001   Assessment: 82 y/o female admitted with lymphadenopathy requiring biopsy. On warfarin PTA for afib and on hold s/p biopsy. Pharmacy consulted to dose heparin .  -heparin level is at goal on1150 units/hr   Goal of Therapy:  Heparin level 0.3-0.7 units/ml Monitor platelets by anticoagulation protocol: Yes   Plan:  -Increase heparin gtt to 1150 units/hr  -Daily heparin level and CBC  -Consider restarting warfarin?   Hildred Laser, Pharm D 12/04/2017 8:16 AM   Addendum -Spoke to Dr. Horris Latino: may restart coumadin -home regimen: regimen: 2.5 mg daily except 5 mg Mon and Fri -INR= 1.36  Plan -Coumadin 5mg  po today -Daily PT/INR  Hildred Laser, Pharm D 12/04/2017 1:43 PM

## 2017-12-04 NOTE — Progress Notes (Signed)
PROGRESS NOTE  JENNIPHER WEATHERHOLTZ BDZ:[id number] DOB: [birth year] DOA: 11/29/2017 PCP: Ann Held, DO  HPI/Recap of past 24 hours: Pt. with PMH of A. fib on Coumadin, CHF, HTN, hypothyroidism, CVA; admitted on 11/29/2017, presented with complaint of right arm pain and swelling, SOB PTA, was found to have brachial neuritis with rapidly developing diffuse lymphadenopathy involving neck, axillae, mediastinum. Lymph node biopsy done on 12/02/17 showed likely diffuse large B-cell non Hodgkin's lymphoma. Hematology on board, started on pred and allopurinol 12/03/17 and plans for rituximab on 12/04/17.  Today, patient reported pain and swelling in R arm is much better. Pt is not in any resp distress and denies any difficulty breathing. Pt denies any chest pain, SOB, abdominal pain, fever/chills.  Assessment/Plan: Active Problems:   Hypoxia   Lymphadenopathy   Acute cystitis without hematuria   Lactic acidosis   Malignant lymphomas of lymph nodes of head, face, and neck (HCC)  #Diffuse large B-cell non Hodgkin's Lymphoma New onset, LN biopsy on 12/02/17 + R arm pain, swelling, diffuse lymphadenopathy on presentation CT scan chest 11/24/2017 shows lymphadenopathy MRI neck shows brachial plexus impingement likely brachial neuritis from lymphadenopathy Hem/onc on board, Dr Jana Hakim reviewed slides with pathology from 12/02/17 which showed diffuse large B cell NHL Start Pred X 5 days and allopurinol on 12/03/17 and rituximab weekly, 1st dose on 12/04/17 Arm sling, as needed narcotics for pain management for now Monitor closely for tumor lysis syndrome (uric acid elevated, give a dose of rasburicase) Follow up closely with Hem/onc outpt, plan is to start bendamustine or CHOP with infusional Adriamycin next week Continue IVF  #Concern for airway compromise with hypoxia. Stable, no resp distress Initially presented with hypoxia with midline shift of the trachea due to bulky  lymphadenopathy CCM was consulted, recommended stepdown admission  Monitor very closely, continuous pulse ox  #Chronic A. Fib. Rate fluctuating (range from 105-120) On Coumadin, held On admission started on IV heparin, due to LN biopsy, held coumadin Continue IV heparin for now, plan to restart coumadin once pt is more stable Continue flecainide.  #Mild hypercalcemia Resolved Continue IV fluids Monitor  #UTI U/A showed mod leuko, pos nitrite, many bacteria UC never sent, will send out Continue rocephin for 5 days total  #Lactic acidosis 7.91->4.9 Likely due to aggressive lymphoma rather than hypoperfusion No signs of sepsis Continue IVF  #Essential hypertension BP controlled Monitor  #Hypothyroidism Continue Synthroid     Code Status: Full  Family Communication: None at bedside  Disposition Plan: Once stable   Consultants:  Hem/onc  PCCM  IR  GS  Procedures:  IR guided needle biopsy   Antimicrobials:  IV rocephin  DVT prophylaxis: IV heparin   Objective: Vitals:   12/03/17 2135 12/04/17 0000 12/04/17 0800 12/04/17 0925  BP: 140/62 (!) 102/52  (!) 147/89  Pulse: 90 79 (!) 123 93  Resp: 18 10 14 18   Temp: 98.4 F (36.9 C)   97.9 F (36.6 C)  TempSrc: Oral   Oral  SpO2: 95% 95% 99% 100%  Weight:      Height:        Intake/Output Summary (Last 24 hours) at 12/04/2017 1139 Last data filed at 12/04/2017 0900 Gross per 24 hour  Intake 130 ml  Output 350 ml  Net -220 ml   Filed Weights   12/01/17 0455 12/02/17 0324 12/03/17 0525  Weight: 80.1 kg (176 lb 9.4 oz) 84.6 kg (186 lb 8.2 oz) 83.8 kg (184 lb 11.9 oz)  Exam:   General: Alert, awake, oriented x3, no distress  Cardiovascular: S1-S2 present, no added heart sound  Respiratory: Chest clear bilaterally  Abdomen: Soft, nontender, nondistended, bowel sounds present  Musculoskeletal: Bilateral arm swelling, worse on the right.  No pedal edema, no calf  tenderness  Skin: No rashes noted  Psychiatry: Normal mood   Data Reviewed: CBC: Recent Labs  Lab 11/29/17 1756 11/30/17 0239 12/01/17 0700 12/03/17 0209 12/04/17 0358  WBC 7.7 4.6 7.0 4.9 3.8*  NEUTROABS 6.1  --  5.2  --   --   HGB 12.8 11.4* 11.0* 10.4* 9.6*  HCT 38.3 33.8* 33.0* 31.2* 28.7*  MCV 103.0* 102.1* 102.5* 103.3* 101.1*  PLT 291 227 241 188 161   Basic Metabolic Panel: Recent Labs  Lab 11/29/17 1756 11/30/17 0239 12/01/17 0700 12/04/17 0358  NA 133* 137 134* 138  K 4.3 4.9 4.2 4.0  CL 98* 99* 99* 104  CO2 20* 23 24 25   GLUCOSE 101* 113* 79 100*  BUN 14 15 23* 14  CREATININE 1.06* 0.91 0.80 0.68  CALCIUM 10.8* 10.3 9.2 10.6*  MG  --  1.4* 1.8  --   PHOS  --  4.3  --   --    GFR: Estimated Creatinine Clearance: 58.7 mL/min (by C-G formula based on SCr of 0.68 mg/dL). Liver Function Tests: Recent Labs  Lab 11/29/17 1756 12/01/17 0700 12/04/17 0358  AST 53* 45* 36  ALT 22 21 22   ALKPHOS 74 60 59  BILITOT 0.6 0.5 0.5  PROT 6.4* 5.4* 4.9*  ALBUMIN 3.4* 2.9* 2.4*   No results for input(s): LIPASE, AMYLASE in the last 168 hours. No results for input(s): AMMONIA in the last 168 hours. Coagulation Profile: Recent Labs  Lab 11/30/17 0239 12/01/17 0527 12/02/17 0531 12/03/17 0209 12/04/17 0358  INR 1.93 3.10 1.76 1.45 1.36   Cardiac Enzymes: No results for input(s): CKTOTAL, CKMB, CKMBINDEX, TROPONINI in the last 168 hours. BNP (last 3 results) No results for input(s): PROBNP in the last 8760 hours. HbA1C: No results for input(s): HGBA1C in the last 72 hours. CBG: No results for input(s): GLUCAP in the last 168 hours. Lipid Profile: No results for input(s): CHOL, HDL, LDLCALC, TRIG, CHOLHDL, LDLDIRECT in the last 72 hours. Thyroid Function Tests: No results for input(s): TSH, T4TOTAL, FREET4, T3FREE, THYROIDAB in the last 72 hours. Anemia Panel: No results for input(s): VITAMINB12, FOLATE, FERRITIN, TIBC, IRON, RETICCTPCT in the last  72 hours. Urine analysis:    Component Value Date/Time   COLORURINE YELLOW 11/29/2017 2145   APPEARANCEUR CLEAR 11/29/2017 2145   LABSPEC 1.040 (H) 11/29/2017 2145   PHURINE 5.0 11/29/2017 2145   GLUCOSEU NEGATIVE 11/29/2017 2145   HGBUR SMALL (A) 11/29/2017 2145   BILIRUBINUR NEGATIVE 11/29/2017 2145   KETONESUR NEGATIVE 11/29/2017 2145   PROTEINUR NEGATIVE 11/29/2017 2145   UROBILINOGEN 0.2 08/11/2015 1244   NITRITE POSITIVE (A) 11/29/2017 2145   LEUKOCYTESUR MODERATE (A) 11/29/2017 2145   Sepsis Labs: @LABRCNTIP (procalcitonin:4,lacticidven:4)  ) Recent Results (from the past 240 hour(s))  Culture, blood (routine x 2)     Status: None (Preliminary result)   Collection Time: 11/30/17 12:15 AM  Result Value Ref Range Status   Specimen Description BLOOD LEFT HAND  Final   Special Requests   Final    BOTTLES DRAWN AEROBIC ONLY Blood Culture adequate volume   Culture NO GROWTH 3 DAYS  Final   Report Status PENDING  Incomplete  Culture, blood (routine x 2)  Status: None (Preliminary result)   Collection Time: 11/30/17 12:25 AM  Result Value Ref Range Status   Specimen Description BLOOD LEFT WRIST  Final   Special Requests   Final    BOTTLES DRAWN AEROBIC ONLY Blood Culture adequate volume   Culture NO GROWTH 3 DAYS  Final   Report Status PENDING  Incomplete      Studies: No results found.  Scheduled Meds: . allopurinol  300 mg Oral Daily  . arformoterol  15 mcg Nebulization BID  . budesonide (PULMICORT) nebulizer solution  0.25 mg Nebulization BID  . flecainide  50 mg Oral BID  . gabapentin  300 mg Oral TID  . levothyroxine  88 mcg Oral QAC breakfast  . lisinopril  20 mg Oral Daily  . polyethylene glycol  17 g Oral Daily  . predniSONE  60 mg Oral Q breakfast  . riTUXimab (RITUXAN) IV infusion  375 mg/m2 Intravenous Once  . senna-docusate  1 tablet Oral QHS    Continuous Infusions: . sodium chloride 75 mL/hr at 12/04/17 0741  . cefTRIAXone (ROCEPHIN)  IV  Stopped (12/03/17 2135)  . heparin 1,150 Units/hr (12/03/17 2111)  . rasburicase (ELITEK) IV infusion       LOS: 4 days     Alma Friendly, MD [facility]  If 7PM-7AM, please contact night-coverage [email or web address] Password [facility] 12/04/2017, 11:39 AM

## 2017-12-05 LAB — COMPREHENSIVE METABOLIC PANEL
ALBUMIN: 2.4 g/dL — AB (ref 3.5–5.0)
ALT: 25 U/L (ref 14–54)
ANION GAP: 9 (ref 5–15)
AST: 44 U/L — ABNORMAL HIGH (ref 15–41)
Alkaline Phosphatase: 62 U/L (ref 38–126)
BILIRUBIN TOTAL: 0.4 mg/dL (ref 0.3–1.2)
BUN: 15 mg/dL (ref 6–20)
CHLORIDE: 107 mmol/L (ref 101–111)
CO2: 24 mmol/L (ref 22–32)
Calcium: 10.6 mg/dL — ABNORMAL HIGH (ref 8.9–10.3)
Creatinine, Ser: 0.8 mg/dL (ref 0.44–1.00)
GFR calc Af Amer: >60 mL/min (ref >60)
GFR calc non Af Amer: >60 mL/min (ref >60)
GLUCOSE: 90 mg/dL (ref 65–99)
POTASSIUM: 4 mmol/L (ref 3.5–5.1)
SODIUM: 140 mmol/L (ref 135–145)
TOTAL PROTEIN: 4.6 g/dL — AB (ref 6.5–8.1)

## 2017-12-05 LAB — URIC ACID: Uric Acid, Serum: 1.7 mg/dL — ABNORMAL LOW (ref 2.3–6.6)

## 2017-12-05 LAB — GLUCOSE, CAPILLARY
Glucose-Capillary: 94 mg/dL (ref 65–99)
Glucose-Capillary: 92 mg/dL (ref 65–99)

## 2017-12-05 LAB — CULTURE, BLOOD (ROUTINE X 2)
Culture: NO GROWTH
Culture: NO GROWTH
SPECIAL REQUESTS: ADEQUATE
Special Requests: ADEQUATE

## 2017-12-05 LAB — PROTIME-INR
INR: 1.33
PROTHROMBIN TIME: 16.4 s — AB (ref 11.4–15.2)

## 2017-12-05 LAB — CBC
HCT: 29.8 % — ABNORMAL LOW (ref 36.0–46.0)
Hemoglobin: 10 g/dL — ABNORMAL LOW (ref 12.0–15.0)
MCH: 34.2 pg — ABNORMAL HIGH (ref 26.0–34.0)
MCHC: 33.6 g/dL (ref 30.0–36.0)
MCV: 102.1 fL — ABNORMAL HIGH (ref 78.0–100.0)
PLATELETS: 175 10*3/uL (ref 150–400)
RBC: 2.92 MIL/uL — AB (ref 3.87–5.11)
RDW: 14.6 % (ref 11.5–15.5)
WBC: 4.3 10*3/uL (ref 4.0–10.5)

## 2017-12-05 LAB — HEPARIN LEVEL (UNFRACTIONATED): HEPARIN UNFRACTIONATED: 0.41 [IU]/mL (ref 0.30–0.70)

## 2017-12-05 LAB — LACTATE DEHYDROGENASE: LDH: 349 U/L — ABNORMAL HIGH (ref 98–192)

## 2017-12-05 MED ORDER — FLECAINIDE ACETATE 50 MG PO TABS
100.0000 mg | ORAL_TABLET | Freq: Two times a day (BID) | ORAL | Status: DC
  Administered 2017-12-05 – 2017-12-09 (×9): 100 mg via ORAL
  Filled 2017-12-05 (×9): qty 2

## 2017-12-05 MED ORDER — WARFARIN SODIUM 5 MG PO TABS
5.0000 mg | ORAL_TABLET | Freq: Once | ORAL | Status: AC
  Administered 2017-12-05: 5 mg via ORAL
  Filled 2017-12-05: qty 1

## 2017-12-05 NOTE — Progress Notes (Signed)
Colleen [name]   DOB:December 29, 1934   KD#:[id number]   LZJ#:[id number]  Subjective: Tolerated rituximab infusion w/o event. Tolerating prednisone w/o side effects that she is aware of. Slept well. Pain in R arm w movement. Unable to do much for herself--"there's not way I can go home like this." Had 3 BMs yesterday. No family in room   Objective: Elderly white woman examined in bed Vitals:   12/05/17 0400 12/05/17 0447  BP: 120/75   Pulse: (!) 117 (!) 118  Resp: 10 12  Temp: 97.8 F (36.6 C)   SpO2: 98% 99%    Body mass index is 31.31 kg/m.  Intake/Output Summary (Last 24 hours) at 12/05/2017 0750 Last data filed at 12/05/2017 0700 Gross per 24 hour  Intake 1913 ml  Output 2000 ml  Net -87 ml   Sclerae unicteric, EOMs intact Right supraclavicular adenopathy feels softer Lungs no rales or rhonchi Heart regular rate and rhythm Abd soft, nontender, positive bowel sounds MSK ROM RUE limited by pain Neuro: nonfocal, well oriented, positive affect  CBG (last 3)  No results for input(s): GLUCAP in the last 72 hours.   Labs:  Lab Results  Component Value Date   WBC 4.3 12/05/2017   HGB 10.0 (L) 12/05/2017   HCT 29.8 (L) 12/05/2017   MCV 102.1 (H) 12/05/2017   PLT 175 12/05/2017   NEUTROABS 5.2 12/01/2017    @LASTCHEMISTRY @  Urine Studies No results for input(s): UHGB, CRYS in the last 72 hours.  Invalid input(s): UACOL, UAPR, USPG, UPH, UTP, UGL, UKET, UBIL, UNIT, UROB, [place], UEPI, UWBC, [place], [place], [place], [place], [place]  Basic Metabolic Panel: Recent Labs  Lab 11/29/17 1756 11/30/17 0239 12/01/17 0700 12/04/17 0358 12/05/17 0248  NA 133* 137 134* 138 140  K 4.3 4.9 4.2 4.0 4.0  CL 98* 99* 99* 104 107  CO2 20* 23 24 25 24   GLUCOSE 101* 113* 79 100* 90  BUN 14 15 23* 14 15  CREATININE 1.06* 0.91 0.80 0.68 0.80  CALCIUM 10.8* 10.3 9.2 10.6* 10.6*  MG  --  1.4* 1.8  --   --   PHOS  --  4.3  --   --   --    GFR Estimated Creatinine Clearance: 58.3 mL/min (by C-G  formula based on SCr of 0.8 mg/dL). Liver Function Tests: Recent Labs  Lab 11/29/17 1756 12/01/17 0700 12/04/17 0358 12/05/17 0248  AST 53* 45* 36 44*  ALT 22 21 22 25   ALKPHOS 74 60 59 62  BILITOT 0.6 0.5 0.5 0.4  PROT 6.4* 5.4* 4.9* 4.6*  ALBUMIN 3.4* 2.9* 2.4* 2.4*   No results for input(s): LIPASE, AMYLASE in the last 168 hours. No results for input(s): AMMONIA in the last 168 hours. Coagulation profile Recent Labs  Lab 12/01/17 0527 12/02/17 0531 12/03/17 0209 12/04/17 0358 12/05/17 0248  INR 3.10 1.76 1.45 1.36 1.33    CBC: Recent Labs  Lab 11/29/17 1756 11/30/17 0239 12/01/17 0700 12/03/17 0209 12/04/17 0358 12/05/17 0248  WBC 7.7 4.6 7.0 4.9 3.8* 4.3  NEUTROABS 6.1  --  5.2  --   --   --   HGB 12.8 11.4* 11.0* 10.4* 9.6* 10.0*  HCT 38.3 33.8* 33.0* 31.2* 28.7* 29.8*  MCV 103.0* 102.1* 102.5* 103.3* 101.1* 102.1*  PLT 291 227 241 188 178 175   Cardiac Enzymes: No results for input(s): CKTOTAL, CKMB, CKMBINDEX, TROPONINI in the last 168 hours. BNP: Invalid input(s): POCBNP CBG: No results for input(s): GLUCAP in the last 168  hours. D-Dimer No results for input(s): DDIMER in the last 72 hours. Hgb A1c No results for input(s): HGBA1C in the last 72 hours. Lipid Profile No results for input(s): CHOL, HDL, LDLCALC, TRIG, CHOLHDL, LDLDIRECT in the last 72 hours. Thyroid function studies No results for input(s): TSH, T4TOTAL, T3FREE, THYROIDAB in the last 72 hours.  Invalid input(s): FREET3 Anemia work up No results for input(s): VITAMINB12, FOLATE, FERRITIN, TIBC, IRON, RETICCTPCT in the last 72 hours. Microbiology Recent Results (from the past 240 hour(s))  Culture, blood (routine x 2)     Status: None (Preliminary result)   Collection Time: 11/30/17 12:15 AM  Result Value Ref Range Status   Specimen Description BLOOD LEFT HAND  Final   Special Requests   Final    BOTTLES DRAWN AEROBIC ONLY Blood Culture adequate volume   Culture NO GROWTH 4  DAYS  Final   Report Status PENDING  Incomplete  Culture, blood (routine x 2)     Status: None (Preliminary result)   Collection Time: 11/30/17 12:25 AM  Result Value Ref Range Status   Specimen Description BLOOD LEFT WRIST  Final   Special Requests   Final    BOTTLES DRAWN AEROBIC ONLY Blood Culture adequate volume   Culture NO GROWTH 4 DAYS  Final   Report Status PENDING  Incomplete      Studies:  Ct Soft Tissue Neck Wo Contrast  Result Date: 11/29/2017 CLINICAL DATA:  82 y/o F; patient undergoing evaluation for lymphadenopathy with biopsy in the left subclavian area straightening, now presenting with swelling of the left arm and chest with associated severe pain. EXAM: CT NECK WITHOUT CONTRAST TECHNIQUE: Multidetector CT imaging of the neck was performed following the standard protocol without intravenous contrast. COMPARISON:  11/24/2017 CT of the neck. FINDINGS: Pharynx and larynx: Normal. No mass or swelling. Salivary glands: No inflammation, mass, or stone. Thyroid: Normal. Lymph nodes: Interval progression of diffuse lymphadenopathy, for example a left level 5B lymph node measures 12 x 12 mm, previously 6 mm (series 3, image 88). Increased lymphadenopathy is best appreciated left posterior cervical chain and left upper axilla but increase in adenopathy is present diffusely, for example a right upper peritracheal node measuring 24 x 31 mm, previously 17 x 23 mm (series 3, image 121). Fat stranding surrounding conglomerate lymph nodes throughout the right cervical chain, right greater than left supraclavicular regions, and left axilla may represent infiltrative neoplasm or lymphedema. Vascular: Calcific atherosclerosis of the aorta and carotid siphons. Limited intracranial: Negative. Visualized orbits: Negative. Mastoids and visualized paranasal sinuses: Small left maxillary sinus mucous retention cyst. Otherwise negative. Skeleton: Stable mild cervical spondylosis. No high-grade bony canal  stenosis. Upper chest: Emphysema and scarring in bilateral upper lobes is stable. Other: None. IMPRESSION: Interval progression of lymphadenopathy from 11/24/2017 best appreciated in the left cervical level 5B station, but measurable increase is present diffusely. Findings indicate rapidly progressive lymphoproliferative or metastatic disease. Correlation with biopsy is recommended. Electronically Signed   By: Kristine Garbe M.D.   On: 11/29/2017 22:54   Ct Soft Tissue Neck W Contrast  Result Date: 11/24/2017 CLINICAL DATA:  RIGHT neck and ear pain, enlarging RIGHT supraclavicular and retroauricular mass for 2 weeks. LEFT facial swelling. History of carotid artery surgery. EXAM: CT NECK WITH CONTRAST TECHNIQUE: Multidetector CT imaging of the neck was performed using the standard protocol following the bolus administration of intravenous contrast. CONTRAST:  52mL ISOVUE-300 IOPAMIDOL (ISOVUE-300) INJECTION 61% COMPARISON:  CT chest September 11, 2015 FINDINGS: PHARYNX AND  LARYNX: Normal.  Widely patent airway. SALIVARY GLANDS: Normal. THYROID: Normal. LYMPH NODES: Multilevel severe RIGHT cervical lymphadenopathy. 27 mm RIGHT level 1 B lymph node, round RIGHT level IIa 23 mm lymph node, 3.1 cm RIGHT supraclavicular lymph node with pericapsular fat stranding RIGHT neck lymphadenopathy. Mild LEFT neck lymphadenopathy including 11 mm intraparotid lymph nodes and 14 mm LEFT level IIa lymph node. VASCULAR: Normal. LIMITED INTRACRANIAL: Normal. VISUALIZED ORBITS: Surgical clips LEFT neck most compatible with endarterectomy. Severe calcific atherosclerosis RIGHT carotid bifurcation. Lymphadenopathy narrows RIGHT internal jugular vein which remains patent. MASTOIDS AND VISUALIZED PARANASAL SINUSES: Well-aerated. SKELETON: Nonacute. No destructive bony lesions. Patient is edentulous. UPPER CHEST: RIGHT apical bullous changes and scarring. Partially imaged bilateral hilar lymphadenopathy. Mediastinal  lymphadenopathy including 2 cm aortopulmonary window lymph node, 17 mm pretracheal lymph node. LEFT greater than RIGHT axillary lymphadenopathy, at least 4 cm nodal conglomeration with LEFT retropectoral infiltrative mass. Heart size is probably enlarged. Severe coronary artery calcifications. OTHER: IMPRESSION: 1. Severe RIGHT and mild LEFT neck lymphadenopathy. Severe LEFT axillary lymphadenopathy with lymph edema versus lymphangitic spread of tumor. LEFT retropectoral infiltrative mass. Mediastinal lymphadenopathy. Constellation of findings seen with lymphoproliferative disease, metastatic disease such as breast cancer given LEFT chest findings. Recommend CT chest with contrast versus PET-CT. 2. These results will be called to the ordering clinician or representative by the Radiologist Assistant, and communication documented in the PACS or zVision Dashboard. Aortic Atherosclerosis (ICD10-I70.0). Electronically Signed   By: Elon Alas M.D.   On: 11/24/2017 20:19   Ct Chest W Contrast  Result Date: 11/29/2017 CLINICAL DATA:  Left-sided chest pain and swelling, recent biopsy on right supraclavicular lymph nodes EXAM: CT CHEST WITH CONTRAST TECHNIQUE: Multidetector CT imaging of the chest was performed during intravenous contrast administration. CONTRAST:  58mL ISOVUE-300 IOPAMIDOL (ISOVUE-300) INJECTION 61% COMPARISON:  11/24/2017 FINDINGS: Cardiovascular: Atherosclerotic changes of the thoracic aorta are noted without aneurysmal dilatation or dissection. Coronary calcifications are seen. No pulmonary emboli are noted. Some attenuation of the pulmonary arterial branches is noted secondary to hilar adenopathy. Additionally some displacement of the vasculature in the neck is noted particularly on the right also related to adenopathy. Mediastinum/Nodes: Thoracic inlet demonstrates evidence of significant right supraclavicular lymphadenopathy. The largest nodal mass measures approximately 10 by 4.9 cm and  causes displacement of the adjacent vascular structures in the right neck. Additional adenopathy is noted in the anterior neck similar to that seen on prior examination. Significant mediastinal and hilar adenopathy is identified. The largest of these in the right hilum measures 2.3 cm in short axis. Subcarinal lymphadenopathy is noted measuring 16 mm in short axis. Prevascular adenopathy is noted measuring 2.2 cm in short axis. The esophagus is within normal limits. Considerable adenopathy is noted in the posterior mediastinum surrounding the descending aorta. The nodal mass measures approximately 2.9 by 3.9 cm. Considerable right axillary adenopathy is noted extending along the lateral chest wall. Lungs/Pleura: Emphysematous changes are noted in the lungs bilaterally. Mild scarring is seen. No focal parenchymal mass is noted. No sizable effusion is seen. Upper Abdomen: Gallbladder has been surgically removed. The liver is within normal limits. The adrenal glands and visualized spleen are unremarkable. Musculoskeletal: T9 compression deformity is noted. Some lucency is noted within. It would be difficult to exclude some metastatic involvement given the findings throughout the chest. Degenerative changes of the thoracic spine are seen. No rib abnormality is noted. Considerable soft tissue mass is noted in the left anterior chest wall involving the pectoral muscles and subpectoral region. The  overall size of the mass measures approximately 10.5 by 6.7 cm and Ing gallstone the subclavian and axillary artery is and veins. Significant skin thickening and edema is noted within the left breast. It would be difficult to exclude a portion of this representing a breast mass. There is apparent involvement of the chest wall although no bony destruction is seen. IMPRESSION: Diffuse lymphadenopathy involving the supraclavicular regions, mediastinum, bilateral hila as well as the axillary regions bilaterally as described above.  Correlation with the recent biopsy results is recommended. It would be difficult to exclude a portion of the abnormality in the left chest wall to be related to the overlying breast given the degree of skin thickening and edema identified inferiorly. Alternatively this may represent an aggressive lymphoma. Further workup with PET-CT is recommended. T9 compression deformity with some lucencies noted within. This may be strictly related to the compression deformity although the possibility of metastatic disease would deserve consideration as well. No other definitive bony abnormality is noted. Aortic Atherosclerosis (ICD10-I70.0) and Emphysema (ICD10-J43.9). Electronically Signed   By: Inez Catalina M.D.   On: 11/29/2017 20:01   Mr Neck Soft Tissue Only W Wo Contrast  Result Date: 11/30/2017 CLINICAL DATA:  Rapidly progressive lymphadenopathy, RIGHT arm swelling since Thanksgiving. Suspected lymphoma. Status post biopsy, results pending. EXAM: MRI OF THE NECK WITH CONTRAST TECHNIQUE: Multiplanar, multisequence MR imaging was performed following the administration of intravenous contrast. CONTRAST:  84mL MULTIHANCE GADOBENATE DIMEGLUMINE 529 MG/ML IV SOLN COMPARISON:  CT neck November 29, 2017 and CT neck November 24, 2017 FINDINGS: Moderately motion degraded examination. PHARYNX AND LARYNX: Normal.  Widely patent airway. SALIVARY GLANDS: Intraparotid lymph nodes bilaterally measuring to 17 mm in deep lobe. No definite primary parotid mass though limited by motion. THYROID: Normal. LYMPH NODES: Severe RIGHT greater than LEFT cervical lymphadenopathy with intermediate to bright T2 signal and, mild enhancement. Lymphadenopathy all levels of the neck, at least 8.4 x 4.2 cm RIGHT supraclavicular nodal conglomeration. Lymphadenopathy abuts the scalene muscles impinges upon the RIGHT brachials plexus. Limited assessment of the brachium plexus due to motion. Massive LEFT retropectoral lymphadenopathy, lesser extent on the  RIGHT. VASCULAR: Normal flow-voids. LIMITED INTRACRANIAL: Normal. VISUALIZED ORBITS: Normal. MASTOIDS AND VISUALIZED PARANASAL SINUSES: Well-aerated. SKELETON: No abnormal bone marrow signal though not tailored for evaluation. Limited assessment of spinal cord due to motion, no suspicious intracanalicular enhancement. UPPER CHEST: Lung apices are clear. No superior mediastinal lymphadenopathy. OTHER: Susceptibility artifact LEFT neck corresponding to use surgical clips. IMPRESSION: 1. Moderately motion degraded examination. 2. Bulky cervical and included chest lymphadenopathy with RIGHT brachial plexus impingement. Electronically Signed   By: Elon Alas M.D.   On: 11/30/2017 22:16   Dg Chest Portable 1 View  Result Date: 11/29/2017 CLINICAL DATA:  Abnormal lymphadenopathy in the neck, chest swelling, initial encounter EXAM: PORTABLE CHEST 1 VIEW COMPARISON:  11/24/2017 FINDINGS: Cardiac shadow is within normal limits. Soft tissue mass lesion is again noted superimposed over the left hilum similar to that seen on recent CT of the neck. Fullness in the hilar regions is noted bilaterally likely representing some adenopathy. Aortic calcifications are seen. The lungs are clear. No bony abnormality is noted. IMPRESSION: Changes consistent with the known left upper lobe mass lesion as well as apparent hilar adenopathy particularly on the right. CT of the chest is recommended for further evaluation as previously described. Electronically Signed   By: Inez Catalina M.D.   On: 11/29/2017 19:10   [place] Core Biopsy (lymph Nodes)  Result Date: 12/02/2017  INDICATION: 82 year old female with presumed lymphoma. She presents for biopsy of nodal mass of the left axilla. EXAM: ULTRASOUND-GUIDED LYMPH NODE BIOPSY MEDICATIONS: None. ANESTHESIA/SEDATION: Moderate (conscious) sedation was employed during this procedure. A total of Versed 2.0 mg and Fentanyl 50 mcg was administered intravenously. Moderate Sedation Time: 10  minutes. The patient's level of consciousness and vital signs were monitored continuously by radiology nursing throughout the procedure under my direct supervision. FLUOROSCOPY TIME:  None COMPLICATIONS: None PROCEDURE: Informed written consent was obtained from the patient after a thorough discussion of the procedural risks, benefits and alternatives. All questions were addressed. Maximal Sterile Barrier Technique was utilized including caps, mask, sterile gowns, sterile gloves, sterile drape, hand hygiene and skin antiseptic. A timeout was performed prior to the initiation of the procedure. Patient positioned supine position on the ultrasound stretcher. Ultrasound images of the left axillary region were performed with images stored and sent to PACs. The patient is prepped and draped in the usual sterile fashion. The skin and subcutaneous tissues were generously infiltrated 1% lidocaine for local anesthesia. Using ultrasound guidance, at least 8 separate 18 gauge core biopsy of the left axillary node were performed. Specimen placed in the saline. Final image was stored. Patient tolerated the procedure well and remained hemodynamically stable throughout. No complications were encountered and no significant blood loss. IMPRESSION: Status post ultrasound-guided biopsy of left axillary mass. Tissue specimen sent to pathology for complete histopathologic analysis. Signed, Dulcy Fanny. Earleen Newport, DO [facility] Radiology Electronically Signed   By: Corrie Mckusick D.O.   On: 12/02/2017 15:51   [place] Core Biopsy (lymph Nodes)  Result Date: 11/28/2017 INDICATION: 82 year old female with a clinical history of rapidly progressive right submental, cervical and supraclavicular lymphadenopathy. She presents today for urgent ultrasound-guided core biopsy. EXAM: ULTRASOUND GUIDED core needle BIOPSY OF right supraclavicular lymph node MEDICATIONS: None. ANESTHESIA/SEDATION: Fentanyl 100 mcg  IV; Versed 2 mg IV Moderate Sedation Time:  15 minutes The patient was continuously monitored during the procedure by the interventional radiology nurse under my direct supervision. PROCEDURE: The procedure, risks, benefits, and alternatives were explained to the patient. Questions regarding the procedure were encouraged and answered. The patient understands and consents to the procedure. The right submental region, the right cervical chain in the right supraclavicular lymph nodes or all interrogated with ultrasound. There are numerous enlarged, hypoechoic an abnormal appearing lymph nodes in each station. The most solid appearing lymph nodes are present in the right supraclavicular station. The right submental lymph node is slightly more cystic and may not yield viable cells. Therefore, the decision was made to proceed with biopsy of the right supraclavicular station lymph nodes. The right supraclavicular region was prepped with chlorhexidine in a sterile fashion, and a sterile drape was applied covering the operative field. A sterile gown and sterile gloves were used for the procedure. Local anesthesia was provided with 1% Lidocaine. A small dermatotomy was made. Under real-time sonographic guidance, numerous 18 gauge core biopsies were obtained of several lymph nodes in the right supraclavicular station. Biopsy specimens were placed in saline and delivered to pathology for further analysis. Post biopsy imaging demonstrates no evidence of hematoma or active bleeding. The patient tolerated the procedure well. There was no significant bleeding. COMPLICATIONS: None immediate. FINDINGS: Extensive hypoechoic lymphadenopathy throughout the right neck and right submental region. IMPRESSION: Technically successful ultrasound-guided core biopsy of right supraclavicular lymph nodes. Electronically Signed   By: Jacqulynn Cadet M.D.   On: 11/28/2017 09:04  Assessment: 82 y.o. [place] woman with a diffuse large cell  B-cell non-Hodgkin's lymphoma, high-grade, diagnosed by lymph node biopsy December 2018  (1) prednisone started 12/03/2017, to be continued for 5 days  (2) rituximab weekly started 12/04/2017  (3) consider [facility]OP with infusional doxorubicin versus bendamustine as outpatient  (4) at risk for tumor lysis  (5) hypercalcemia--related to NHL; will check PTH related peptide  (6) placement: may need SNF/Rehab   Plan: Day 3 prednisone today; day 2 cycle 1 rituximab which was well-tolerated. She is reporting some improvement in R arm pain but she is still unable to do ADL and likely will need SNF initially. LDH is coming down  Good response to rasburicase, no evidence of TLS to date. Will ask one of my partners to look in over weekend.  I am tentatively going to schedule her to see [place] next Thursday at the cancer center for cycle 2 of rituximab. Will decide on further treatment plans at that time.  Please let me know if we cna be of further help at this point.   Chauncey Cruel, MD 12/05/2017  7:50 AM Medical Oncology and Hematology [facility] [address] [place], [place] [address] Tel. [phone]    Fax. [phone]

## 2017-12-05 NOTE — Progress Notes (Signed)
PROGRESS NOTE    Colleen [name]  QVZ:[id number] DOB: Jun 14, 1935 DOA: 11/29/2017 PCP: Ann Held, DO   Brief Narrative:Pt. with PMH of A. fib on Coumadin, CHF, HTN, hypothyroidism, CVA; admitted on12/07/2017, presented with complaint of right arm pain and swelling, SOB PTA, was found to have brachial neuritis with rapidly developing diffuse lymphadenopathy involving neck, axillae, mediastinum. Lymph node biopsy done on 12/02/17 showed likely diffuse large B-cell non Hodgkin's lymphoma. Hematology on board, started on pred and allopurinol 12/03/17 and plans for rituximab on 12/04/17.  Today, patient reported pain and swelling in R arm is much better. Pt is not in any resp distress and denies any difficulty breathing. Pt denies any chest pain, SOB, abdominal pain, fever/chills.  12/14-Patient reports feeling weak,hasnt gotten out of bed.cannot use the right arm.received Rituximab 2 nd dose,prednisone and rasburicase.   Assessment & Plan:   Active Problems:   Hypoxia   Lymphadenopathy   Acute cystitis without hematuria   Lactic acidosis   Malignant lymphomas of lymph nodes of head, face, and neck (HCC)  Diffuse large B-cell non Hodgkin's Lymphoma New onset, LN biopsy on 12/02/17 + R arm pain, swelling, diffuse lymphadenopathy on presentation CT scan chest 11/24/2017 shows lymphadenopathy MRI neck shows brachial plexus impingement likely brachial neuritis from lymphadenopathy Hem/onc on board, Dr Jana Hakim reviewed slides with pathology from 12/02/17 which showed diffuse large B cell NHL Start Pred X 5 days and allopurinol on 12/03/17 and rituximab weekly, 1st dose on 12/04/17 Arm sling, as needed narcotics for pain management for now Monitor closely for tumor lysis syndrome (uric acid elevated, given a dose of rasburicase) Follow up closely with Hem/onc outpt, plan is to start bendamustine or CHOP with infusional Adriamycin next week Continue IVF  #Concern for airway  compromise with hypoxia. Stable, no resp distress Initially presented with hypoxia with midline shift of the trachea due to bulky lymphadenopathy CCM was consulted, recommended stepdown admission  Monitor very closely, continuous pulse ox  #Chronic A. Fib. Rate fluctuating (range from 105-120) On Coumadin, held On admissionstarted on IV heparin, due to LN biopsy, held coumadin Continue IV heparin for now, plan to restart coumadin once pt is more stable Continue flecainide.heart rate as high as 140.will increase the dose of flecainide. #Mild hypercalcemia Resolved Continue IV fluids Monitor  #UTI U/A showed mod leuko, pos nitrite, many bacteria UC never sent, will send out Continue rocephin for 5 days total  #Lactic acidosis 7.91->4.9 Likely due to aggressive lymphoma rather than hypoperfusion No signs of sepsis Continue IVF  #Essential hypertension BP controlled Monitor  #Hypothyroidism Continue Synthroid     DVT prophylaxis heparin :Code Status: Full code Family Communication: No family available Disposition Plan: Will obtain PT consult patient most likely will need sniff skilled nursing facility placement.  If patient remains stable consider discharge soon and follow-up with oncology for further continuation of chemotherapy.  Will need to restart Coumadin and DC heparin prior to discharge. Consultants oncology P CCM, interventional radiology      Procedures: IR guided needle biopsy Antimicrobials: Rocephin Subjective: Complaining of palpitation, denies chest pain shortness of breath.  Objective: Resting in bed in no acute distress Vitals:   12/05/17 0000 12/05/17 0400 12/05/17 0447 12/05/17 0930  BP: 131/76 120/75  134/80  Pulse: (!) 122 (!) 117 (!) 118 (!) 127  Resp: 15 10 12 20   Temp: 98.1 F (36.7 C) 97.8 F (36.6 C)  98.3 F (36.8 C)  TempSrc: Oral Oral  Oral  SpO2: 97%  98% 99% 99%  Weight:   82.7 kg (182 lb 6.4 oz)   Height:         Intake/Output Summary (Last 24 hours) at 12/05/2017 1156 Last data filed at 12/05/2017 3614 Gross per 24 hour  Intake 1793 ml  Output 1650 ml  Net 143 ml   Filed Weights   12/02/17 0324 12/03/17 0525 12/05/17 0447  Weight: 84.6 kg (186 lb 8.2 oz) 83.8 kg (184 lb 11.9 oz) 82.7 kg (182 lb 6.4 oz)    Examination:  General exam: Appears calm and comfortable  Respiratory system: Clear to auscultation. Respiratory effort normal. Cardiovascular system: S1 & S2 heard, RRR. No JVD, murmurs, rubs, gallops or clicks. No pedal edema. Gastrointestinal system: Abdomen is nondistended, soft and nontender. No organomegaly or masses felt. Normal bowel sounds heard. Central nervous system: Alert and oriented. No focal neurological deficits. Extremities: RUE EDEMA Skin: No rashes, lesions or ulcers Psychiatry: Judgement and insight appear normal. Mood & affect appropriate.     Data Reviewed: I have personally reviewed following labs and imaging studies  CBC: Recent Labs  Lab 11/29/17 1756 11/30/17 0239 12/01/17 0700 12/03/17 0209 12/04/17 0358 12/05/17 0248  WBC 7.7 4.6 7.0 4.9 3.8* 4.3  NEUTROABS 6.1  --  5.2  --   --   --   HGB 12.8 11.4* 11.0* 10.4* 9.6* 10.0*  HCT 38.3 33.8* 33.0* 31.2* 28.7* 29.8*  MCV 103.0* 102.1* 102.5* 103.3* 101.1* 102.1*  PLT 291 227 241 188 178 431   Basic Metabolic Panel: Recent Labs  Lab 11/29/17 1756 11/30/17 0239 12/01/17 0700 12/04/17 0358 12/05/17 0248  NA 133* 137 134* 138 140  K 4.3 4.9 4.2 4.0 4.0  CL 98* 99* 99* 104 107  CO2 20* 23 24 25 24   GLUCOSE 101* 113* 79 100* 90  BUN 14 15 23* 14 15  CREATININE 1.06* 0.91 0.80 0.68 0.80  CALCIUM 10.8* 10.3 9.2 10.6* 10.6*  MG  --  1.4* 1.8  --   --   PHOS  --  4.3  --   --   --    GFR: Estimated Creatinine Clearance: 58.3 mL/min (by C-G formula based on SCr of 0.8 mg/dL). Liver Function Tests: Recent Labs  Lab 11/29/17 1756 12/01/17 0700 12/04/17 0358 12/05/17 0248  AST 53* 45*  36 44*  ALT 22 21 22 25   ALKPHOS 74 60 59 62  BILITOT 0.6 0.5 0.5 0.4  PROT 6.4* 5.4* 4.9* 4.6*  ALBUMIN 3.4* 2.9* 2.4* 2.4*   No results for input(s): LIPASE, AMYLASE in the last 168 hours. No results for input(s): AMMONIA in the last 168 hours. Coagulation Profile: Recent Labs  Lab 12/01/17 0527 12/02/17 0531 12/03/17 0209 12/04/17 0358 12/05/17 0248  INR 3.10 1.76 1.45 1.36 1.33   Cardiac Enzymes: No results for input(s): CKTOTAL, CKMB, CKMBINDEX, TROPONINI in the last 168 hours. BNP (last 3 results) No results for input(s): PROBNP in the last 8760 hours. HbA1C: No results for input(s): HGBA1C in the last 72 hours. CBG: Recent Labs  Lab 12/05/17 1050  GLUCAP 94   Lipid Profile: No results for input(s): CHOL, HDL, LDLCALC, TRIG, CHOLHDL, LDLDIRECT in the last 72 hours. Thyroid Function Tests: No results for input(s): TSH, T4TOTAL, FREET4, T3FREE, THYROIDAB in the last 72 hours. Anemia Panel: No results for input(s): VITAMINB12, FOLATE, FERRITIN, TIBC, IRON, RETICCTPCT in the last 72 hours. Sepsis Labs: Recent Labs  Lab 11/29/17 1803 11/29/17 2017 11/30/17 0002 11/30/17 0239 12/01/17 0700 12/02/17 0531  PROCALCITON  --   --  <0.10  --  <0.10 <0.10  LATICACIDVEN 7.91* 6.99* 4.9* 4.9*  --   --     Recent Results (from the past 240 hour(s))  Culture, blood (routine x 2)     Status: None   Collection Time: 11/30/17 12:15 AM  Result Value Ref Range Status   Specimen Description BLOOD LEFT HAND  Final   Special Requests   Final    BOTTLES DRAWN AEROBIC ONLY Blood Culture adequate volume   Culture NO GROWTH 5 DAYS  Final   Report Status 12/05/2017 FINAL  Final  Culture, blood (routine x 2)     Status: None   Collection Time: 11/30/17 12:25 AM  Result Value Ref Range Status   Specimen Description BLOOD LEFT WRIST  Final   Special Requests   Final    BOTTLES DRAWN AEROBIC ONLY Blood Culture adequate volume   Culture NO GROWTH 5 DAYS  Final   Report Status  12/05/2017 FINAL  Final         Radiology Studies: No results found.      Scheduled Meds: . allopurinol  300 mg Oral Daily  . arformoterol  15 mcg Nebulization BID  . budesonide (PULMICORT) nebulizer solution  0.25 mg Nebulization BID  . flecainide  100 mg Oral BID  . gabapentin  300 mg Oral TID  . levothyroxine  88 mcg Oral QAC breakfast  . lisinopril  20 mg Oral Daily  . polyethylene glycol  17 g Oral Daily  . predniSONE  60 mg Oral Q breakfast  . senna-docusate  1 tablet Oral QHS  . warfarin  5 mg Oral ONCE-1800  . Warfarin - Pharmacist Dosing Inpatient   Does not apply q1800   Continuous Infusions: . sodium chloride 75 mL/hr at 12/04/17 2313  . cefTRIAXone (ROCEPHIN)  IV Stopped (12/04/17 2217)  . heparin 1,150 Units/hr (12/04/17 2313)     LOS: 5 days      Georgette Shell, MD [facility]  If 7PM-7AM, please contact night-coverage [email or web address] Password [email or web address] 12/05/2017, 11:56 AM

## 2017-12-05 NOTE — Progress Notes (Signed)
ANTICOAGULATION CONSULT NOTE   Pharmacy Consult for Heparin + Warfarin Indication: atrial fibrillation  No Known Allergies  Patient Measurements: Height: 5\' 4"  (162.6 cm) Weight: 182 lb 6.4 oz (82.7 kg) IBW/kg (Calculated) : 54.7  Heparin dosing weight = 70.5 kg  Vital Signs: Temp: 97.8 F (36.6 C) (12/14 0400) Temp Source: Oral (12/14 0400) BP: 120/75 (12/14 0400) Pulse Rate: 118 (12/14 0447)  Labs: Recent Labs    12/03/17 0209 12/03/17 1127 12/04/17 0358 12/05/17 0248  HGB 10.4*  --  9.6* 10.0*  HCT 31.2*  --  28.7* 29.8*  PLT 188  --  178 175  LABPROT 17.5*  --  16.7* 16.4*  INR 1.45  --  1.36 1.33  HEPARINUNFRC 0.26* 0.40 0.46 0.41  CREATININE  --   --  0.68 0.80   Estimated Creatinine Clearance: 58.3 mL/min (by C-G formula based on SCr of 0.8 mg/dL).  Assessment: 82 y/o female admitted with lymphadenopathy requiring biopsy. She is on chronic warfarin for history of afib but this was held for biopsy. Heparin initiated and warfarin was restarted 12/13. Heparin level remains therapeutic at 0.41. INR is subtherapeutic as expected. No bleeding noted and CBC is stable.   Goal of Therapy:  INR 2-3 Heparin level 0.3-0.7 units/ml Monitor platelets by anticoagulation protocol: Yes   Plan:  Continue heparin gtt 1150 units/hr  Repeat warfarin 5mg  PO x 1 tonight Daily INR, heparin level and CBC  Salome Arnt, PharmD, BCPS Phone #: [phone] until 3:30pm All other times, call [place] x [phone] 12/05/2017 8:21 AM

## 2017-12-06 LAB — CBC WITH DIFFERENTIAL/PLATELET
BASOS ABS: 0 10*3/uL (ref 0.0–0.1)
BASOS PCT: 0 %
EOS ABS: 0.1 10*3/uL (ref 0.0–0.7)
Eosinophils Relative: 2 %
HCT: 31.7 % — ABNORMAL LOW (ref 36.0–46.0)
HEMOGLOBIN: 10.5 g/dL — AB (ref 12.0–15.0)
Lymphocytes Relative: 12 %
Lymphs Abs: 0.5 10*3/uL — ABNORMAL LOW (ref 0.7–4.0)
MCH: 34 pg (ref 26.0–34.0)
MCHC: 33.1 g/dL (ref 30.0–36.0)
MCV: 102.6 fL — ABNORMAL HIGH (ref 78.0–100.0)
MONOS PCT: 7 %
Monocytes Absolute: 0.3 10*3/uL (ref 0.1–1.0)
NEUTROS PCT: 79 %
Neutro Abs: 3.2 10*3/uL (ref 1.7–7.7)
Platelets: 181 10*3/uL (ref 150–400)
RBC: 3.09 MIL/uL — ABNORMAL LOW (ref 3.87–5.11)
RDW: 14.7 % (ref 11.5–15.5)
WBC: 4 10*3/uL (ref 4.0–10.5)

## 2017-12-06 LAB — COMPREHENSIVE METABOLIC PANEL
ALBUMIN: 2.4 g/dL — AB (ref 3.5–5.0)
ALT: 27 U/L (ref 14–54)
ANION GAP: 10 (ref 5–15)
AST: 42 U/L — ABNORMAL HIGH (ref 15–41)
Alkaline Phosphatase: 66 U/L (ref 38–126)
BUN: 14 mg/dL (ref 6–20)
CALCIUM: 10.9 mg/dL — AB (ref 8.9–10.3)
CHLORIDE: 104 mmol/L (ref 101–111)
CO2: 26 mmol/L (ref 22–32)
Creatinine, Ser: 0.69 mg/dL (ref 0.44–1.00)
GFR calc non Af Amer: >60 mL/min (ref >60)
Glucose, Bld: 70 mg/dL (ref 65–99)
POTASSIUM: 3.6 mmol/L (ref 3.5–5.1)
SODIUM: 140 mmol/L (ref 135–145)
Total Bilirubin: 0.3 mg/dL (ref 0.3–1.2)
Total Protein: 4.9 g/dL — ABNORMAL LOW (ref 6.5–8.1)

## 2017-12-06 LAB — PROTIME-INR
INR: 1.44
PROTHROMBIN TIME: 17.5 s — AB (ref 11.4–15.2)

## 2017-12-06 LAB — GLUCOSE, CAPILLARY
GLUCOSE-CAPILLARY: 70 mg/dL (ref 65–99)
GLUCOSE-CAPILLARY: 65 mg/dL (ref 65–99)

## 2017-12-06 LAB — URIC ACID
URIC ACID, SERUM: 0.9 mg/dL — AB (ref 2.3–6.6)
URIC ACID, SERUM: 0.9 mg/dL — AB (ref 2.3–6.6)

## 2017-12-06 LAB — MAGNESIUM: MAGNESIUM: 1.5 mg/dL — AB (ref 1.7–2.4)

## 2017-12-06 LAB — HEPARIN LEVEL (UNFRACTIONATED): HEPARIN UNFRACTIONATED: 0.35 [IU]/mL (ref 0.30–0.70)

## 2017-12-06 LAB — PHOSPHORUS: PHOSPHORUS: 2.5 mg/dL (ref 2.5–4.6)

## 2017-12-06 LAB — LACTATE DEHYDROGENASE: LDH: 370 U/L — ABNORMAL HIGH (ref 98–192)

## 2017-12-06 MED ORDER — DEXTROSE-NACL 5-0.9 % IV SOLN
INTRAVENOUS | Status: DC
  Administered 2017-12-06 – 2017-12-08 (×4): via INTRAVENOUS

## 2017-12-06 MED ORDER — METOPROLOL TARTRATE 12.5 MG HALF TABLET
12.5000 mg | ORAL_TABLET | Freq: Two times a day (BID) | ORAL | Status: DC
  Administered 2017-12-06 – 2017-12-09 (×7): 12.5 mg via ORAL
  Filled 2017-12-06 (×7): qty 1

## 2017-12-06 MED ORDER — MAGNESIUM SULFATE 2 GM/50ML IV SOLN
2.0000 g | Freq: Once | INTRAVENOUS | Status: AC
  Administered 2017-12-06: 2 g via INTRAVENOUS
  Filled 2017-12-06: qty 50

## 2017-12-06 MED ORDER — CALCITONIN (SALMON) 200 UNIT/ACT NA SOLN
1.0000 | Freq: Every day | NASAL | Status: AC
  Administered 2017-12-06 – 2017-12-07 (×2): 1 via NASAL
  Filled 2017-12-06: qty 3.7

## 2017-12-06 NOTE — Progress Notes (Signed)
ANTICOAGULATION CONSULT NOTE   Pharmacy Consult for Heparin + Warfarin Indication: atrial fibrillation  No Known Allergies  Patient Measurements: Height: 5\' 4"  (162.6 cm) Weight: 190 lb 11.2 oz (86.5 kg) IBW/kg (Calculated) : 54.7  Heparin dosing weight = 70.5 kg  Vital Signs: Temp: 98 F (36.7 C) (12/15 0820) Temp Source: Oral (12/15 0820) BP: 138/88 (12/15 0820) Pulse Rate: 110 (12/15 1020)  Labs: Recent Labs    12/04/17 0358 12/05/17 0248 12/06/17 0500  HGB 9.6* 10.0* 10.5*  HCT 28.7* 29.8* 31.7*  PLT 178 175 181  LABPROT 16.7* 16.4* 17.5*  INR 1.36 1.33 1.44  HEPARINUNFRC 0.46 0.41 0.35  CREATININE 0.68 0.80 0.69   Estimated Creatinine Clearance: 59.7 mL/min (by C-G formula based on SCr of 0.69 mg/dL).  Assessment: 82 y/o female admitted with lymphadenopathy requiring biopsy. She is on chronic warfarin for history of afib but this was held for biopsy. Heparin initiated and warfarin was restarted 12/13. Heparin level remains therapeutic at 0.31. INR  with slow rise up.   Home coumadin dose: 2.5mg  daily except 5mg  MF  Goal of Therapy:  INR 2-3 Heparin level 0.3-0.7 units/ml Monitor platelets by anticoagulation protocol: Yes   Plan:  -No heparin changes needed -Coumadin 5mg  po today  Hildred Laser, Pharm D 12/06/2017 12:05 PM

## 2017-12-06 NOTE — Evaluation (Signed)
Physical Therapy Evaluation Patient Details Name: Colleen [name] MRN: [id number] DOB: Jun 15, 1935 Today's Date: 12/06/2017   History of Present Illness  Pt. with PMH of A. fib, CHF, HTN, hypothyroidism, CVA; admitted on 11/29/2017, presented with complaint of right arm pain and swelling, SOB PTA, was found to have brachial neuritis with rapidly developing diffuse lymphadenopathy involving neck, axillae, mediastinum. Lymph node biopsy done on 12/02/17 showed likely diffuse large B-cell non Hodgkin's lymphoma.  Clinical Impression  Pt admitted with above complications. Pt currently with functional limitations due to the deficits listed below (see PT Problem List). Patient reports she has not been able to get out of the bed for 1 week. Today, she was able to stand twice from the bed with moderate assistance and pivot to a recliner with min assist. Tolerated shorter duration standing exercises. She is very motivated. Lives at home with her husband and was previously independent. Her husband does not have great physical abilities to completely care for pt. Pt will benefit from skilled PT to increase their independence and safety with mobility to allow discharge to the venue listed below.       Follow Up Recommendations CIR((Pending progress)) (Transportation will need to be arranged for Oncology appointment this coming Friday.)    Equipment Recommendations  Other (comment)(TBD pending progress)    Recommendations for Other Services Rehab consult;OT consult     Precautions / Restrictions Precautions Precautions: (Monitor O2) Restrictions Weight Bearing Restrictions: No      Mobility  Bed Mobility Overal bed mobility: Needs Assistance Bed Mobility: Supine to Sit     Supine to sit: Mod assist     General bed mobility comments: Pt able to bring LEs to EOB. VC for technique and assistance for her RUE to be cradled by LUE. Mod assist for truncal support to rise to EOB.  Transfers Overall  transfer level: Needs assistance Equipment used: 1 person hand held assist Transfers: Sit to/from [facility] Sit to Stand: Mod assist Stand pivot transfers: Min assist       General transfer comment: Mod assist for boost to stand, initially using LUE to reach forward to initiate forward lean. Gait belt used to assist with stand and patient tolerated this well. No buckling but significant weakness initially. Practiced twice from bed with second attempt using LUE support only to rise. Good pivotal steps to recliner with some instability requiring min assist for balance and guidance to chair.  Ambulation/Gait                Stairs            Wheelchair Mobility    Modified Rankin (Stroke Patients Only)       Balance Overall balance assessment: Needs assistance Sitting-balance support: No upper extremity supported;Feet supported Sitting balance-Leahy Scale: Good     Standing balance support: No upper extremity supported Standing balance-Leahy Scale: Fair Standing balance comment: Stood at EOB with chair for support from LUE. Able to perform standing march and heel raises. Practiced weight shifting laterally                             Pertinent Vitals/Pain Pain Assessment: Faces Faces Pain Scale: Hurts even more Pain Location: RUE when moved Pain Descriptors / Indicators: Aching Pain Intervention(s): Monitored during session;Repositioned;Limited activity within patient's tolerance    Home Living Family/patient expects to be discharged to:: Unsure Living Arrangements: Spouse/significant other Available Help at Discharge: Family;Available 24  hours/day(Husband is limited physically ) Type of Home: House Home Access: Stairs to enter   [employer] of Steps: 4 Home Layout: Multi-level;Bed/bath upstairs Home Equipment: Walker - 2 wheels;Shower seat;Bedside commode;Wheelchair - manual      Prior Function Level of Independence:  Independent               Hand Dominance   Dominant Hand: Right    Extremity/Trunk Assessment   Upper Extremity Assessment Upper Extremity Assessment: Defer to OT evaluation(RUE edema)    Lower Extremity Assessment Lower Extremity Assessment: Generalized weakness       Communication   Communication: No difficulties  Cognition Arousal/Alertness: Awake/alert Behavior During Therapy: WFL for tasks assessed/performed Overall Cognitive Status: Within Functional Limits for tasks assessed                                        General Comments General comments (skin integrity, edema, etc.): RUE edema. SpO2 87% on Room air, improves to mid 90s on 2L supplmental O2.    Exercises General Exercises - Lower Extremity Ankle Circles/Pumps: AROM;Both;10 reps;Seated Quad Sets: Strengthening;Both;10 reps;Seated Hip Flexion/Marching: Strengthening;Both;10 reps;Standing Heel Raises: Strengthening;Both;10 reps;Standing   Assessment/Plan    PT Assessment Patient needs continued PT services  PT Problem List Decreased strength;Decreased range of motion;Decreased activity tolerance;Decreased balance;Decreased mobility;Decreased knowledge of use of DME;Cardiopulmonary status limiting activity;Pain;Obesity       PT Treatment Interventions DME instruction;Gait training;Functional mobility training;Therapeutic activities;Balance training;Therapeutic exercise;Patient/family education    PT Goals (Current goals can be found in the Care Plan section)  Acute Rehab PT Goals Patient Stated Goal: Get stronger before I go home because I have to go to the Oncologist Friday. PT Goal Formulation: With patient Time For Goal Achievement: 12/20/17 Potential to Achieve Goals: Good    Frequency Min 3X/week   Barriers to discharge Decreased caregiver support Husband not physically well himself.    Co-evaluation               AM-PAC PT "6 Clicks" Daily Activity  Outcome  Measure Difficulty turning over in bed (including adjusting bedclothes, sheets and blankets)?: A Lot Difficulty moving from lying on back to sitting on the side of the bed? : A Lot Difficulty sitting down on and standing up from a chair with arms (e.g., wheelchair, bedside commode, etc,.)?: A Lot Help needed moving to and from a bed to chair (including a wheelchair)?: A Lot Help needed walking in hospital room?: A Lot Help needed climbing 3-5 steps with a railing? : Total 6 Click Score: 11    End of Session Equipment Utilized During Treatment: Gait belt;Oxygen Activity Tolerance: Patient tolerated treatment well Patient left: in chair;with call bell/phone within reach;with nursing/sitter in room;with family/visitor present;with SCD's reapplied Nurse Communication: Mobility status PT Visit Diagnosis: Unsteadiness on feet (R26.81);Difficulty in walking, not elsewhere classified (R26.2);Muscle weakness (generalized) (M62.81);Pain Pain - Right/Left: Right Pain - part of body: Shoulder;Arm;Hand    Time: 1520-1600 PT Time Calculation (min) (ACUTE ONLY): 40 min   Charges:   PT Evaluation $PT Eval Moderate Complexity: 1 Mod PT Treatments $Therapeutic Exercise: 8-22 mins $Therapeutic Activity: 8-22 mins   PT G Codes:        [employer], PT  Ellouise Newer 12/06/2017, 4:18 PM

## 2017-12-06 NOTE — Progress Notes (Signed)
Oncology short note  Patient with new DLBCL -- reviewed labs for TLS per requested by Dr Curlene Labrum.  Uric acid -- stable -- improved s/p Rasburicase. Nl potassium and phosphorus. Still with hypercalcemia. Corrected calcium 11.6. PLAN -daily cbc/diff, cmp, uric acid, phosphorus. -Prednisone + IVF( per hospitalist) for correction of hypercalcemia. -ordered calcitonin nasal spray x 2 doses. -if hypercalcemia persistent or worsening despite this - would need to consider IV Bisphosphonate . plz call if questions  Sullivan Lone MD MS  Component     Latest Ref Rng & Units 12/06/2017  WBC     4.0 - 10.5 K/uL 4.0  RBC     3.87 - 5.11 MIL/uL 3.09 (L)  Hemoglobin     12.0 - 15.0 g/dL 10.5 (L)  HCT     36.0 - 46.0 % 31.7 (L)  MCV     78.0 - 100.0 fL 102.6 (H)  MCH     26.0 - 34.0 pg 34.0  MCHC     30.0 - 36.0 g/dL 33.1  RDW     11.5 - 15.5 % 14.7  Platelets     150 - 400 K/uL 181  Neutrophils     % 79  NEUT#     1.7 - 7.7 K/uL 3.2  Lymphocytes     % 12  Lymphocyte #     0.7 - 4.0 K/uL 0.5 (L)  Monocytes Relative     % 7  Monocyte #     0.1 - 1.0 K/uL 0.3  Eosinophil     % 2  Eosinophils Absolute     0.0 - 0.7 K/uL 0.1  Basophil     % 0  Basophils Absolute     0.0 - 0.1 K/uL 0.0  Sodium     135 - 145 mmol/L 140  Potassium     3.5 - 5.1 mmol/L 3.6  Chloride     101 - 111 mmol/L 104  CO2     22 - 32 mmol/L 26  Glucose     65 - 99 mg/dL 70  BUN     6 - 20 mg/dL 14  Creatinine     0.44 - 1.00 mg/dL 0.69  Calcium     8.9 - 10.3 mg/dL 10.9 (H)  Total Protein     6.5 - 8.1 g/dL 4.9 (L)  Albumin     3.5 - 5.0 g/dL 2.4 (L)  AST     15 - 41 U/L 42 (H)  ALT     14 - 54 U/L 27  Alkaline Phosphatase     38 - 126 U/L 66  Total Bilirubin     0.3 - 1.2 mg/dL 0.3  GFR, Est Non African American     >60 mL/min >60  GFR, Est African American     >60 mL/min >60  Anion gap     5 - 15 10  LDH     98 - 192 U/L 370 (H)  Uric Acid, Serum     2.3 - 6.6 mg/dL 0.9 (L)   Magnesium     1.7 - 2.4 mg/dL 1.5 (L)  Phosphorus     2.5 - 4.6 mg/dL 2.5

## 2017-12-06 NOTE — Plan of Care (Signed)
  Progressing Education: Knowledge of General Education information will improve 12/06/2017 0121 - Progressing by Blair Promise, RN Clinical Measurements: Ability to maintain clinical measurements within normal limits will improve 12/06/2017 0121 - Progressing by Blair Promise, RN Diagnostic test results will improve 12/06/2017 0121 - Progressing by Blair Promise, RN Activity: Risk for activity intolerance will decrease 12/06/2017 0121 - Progressing by Blair Promise, RN Pain Managment: General experience of comfort will improve 12/06/2017 0121 - Progressing by Blair Promise, RN Skin Integrity: Risk for impaired skin integrity will decrease 12/06/2017 0121 - Progressing by Blair Promise, RN Health Behavior/Discharge Planning: Ability to manage health-related needs will improve 12/06/2017 0121 - Progressing by Blair Promise, RN Coping: Level of anxiety will decrease 12/06/2017 0121 - Progressing by Blair Promise, RN Elimination: Will not experience complications related to bowel motility 12/06/2017 0121 - Progressing by Blair Promise, RN Will not experience complications related to urinary retention 12/06/2017 0121 - Progressing by Blair Promise, RN Cardiac: Ability to achieve and maintain adequate cardiopulmonary perfusion will improve 12/06/2017 0121 - Progressing by Blair Promise, RN

## 2017-12-06 NOTE — Progress Notes (Signed)
PROGRESS NOTE  EMALI HEYWARD IRW:[id number] DOB: [birth year] DOA: 11/29/2017 PCP: Ann Held, DO  HPI/Recap of past 24 hours: Pt. with PMH of A. fib on Coumadin, CHF, HTN, hypothyroidism, CVA; admitted on 11/29/2017, presented with complaint of right arm pain and swelling, SOB PTA, was found to have brachial neuritis with rapidly developing diffuse lymphadenopathy involving neck, axillae, mediastinum. Lymph node biopsy done on 12/02/17 showed likely diffuse large B-cell non Hodgkin's lymphoma. Hematology on board, started on pred and allopurinol 12/03/17 and plans for rituximab on 12/04/17.  Today, patient reported pain and swelling in R arm is much better, but cannot move the R arm at all. Pt is not in any resp distress and denies any difficulty breathing. Pt denies any chest pain, SOB, abdominal pain, fever/chills. Still tachycardic  Assessment/Plan: Active Problems:   Hypoxia   Lymphadenopathy   Acute cystitis without hematuria   Lactic acidosis   Malignant lymphomas of lymph nodes of head, face, and neck (HCC)  #Diffuse large B-cell non Hodgkin's Lymphoma New onset, LN biopsy on 12/02/17 + R arm pain, swelling, diffuse lymphadenopathy on presentation CT scan chest 11/24/2017 shows lymphadenopathy MRI neck shows brachial plexus impingement likely brachial neuritis from lymphadenopathy Hem/onc on board, Dr Jana Hakim reviewed slides with pathology from 12/02/17 which showed diffuse large B cell NHL Start Pred X 5 days and allopurinol on 12/03/17 and rituximab weekly, 1st dose on 12/04/17 Arm sling, as needed narcotics for pain management for now Monitor closely for tumor lysis syndrome (uric acid elevated, give a dose of rasburicase on 12/04/17) Follow up closely with Hem/onc outpt, plan is to start bendamustine or CHOP with infusional Adriamycin next week Continue IVF  #Concern for airway compromise with hypoxia. Stable, no resp distress Initially presented with hypoxia  with midline shift of the trachea due to bulky lymphadenopathy CCM was consulted, recommended stepdown admission  Monitor very closely, continuous pulse ox  #Chronic A. Fib. Rate fluctuating (range from 105-120) On Coumadin, heparin bridging Continue flecainide, started low dose lopressor due to present HR  #Mild hypercalcemia Persistent Oncology on board, give calcitonin nasal spray X 2 doses, consider IV bisphosphonate if persistent Continue IV fluids Monitor, daily BMP  #UTI U/A showed mod leuko, pos nitrite, many bacteria UC never sent, will send out S/P IV Rocephin for 6 days total  #Lactic acidosis 7.91->4.9 Likely due to aggressive lymphoma rather than hypoperfusion No signs of sepsis Continue IVF  #Essential hypertension BP controlled Monitor  #Hypothyroidism Continue Synthroid     Code Status: Full  Family Communication: None at bedside  Disposition Plan: SNF, awaiting PT recs   Consultants:  Hem/onc  PCCM  IR  GS  Procedures:  IR guided needle biopsy   Antimicrobials:  S/P IV rocephin  DVT prophylaxis: Coumadin   Objective: Vitals:   12/06/17 0503 12/06/17 0820 12/06/17 1020 12/06/17 1200  BP:  138/88  124/90  Pulse:  (!) 106 (!) 110 (!) 124  Resp:  18  18  Temp:  98 F (36.7 C)  98.1 F (36.7 C)  TempSrc:  Oral  Oral  SpO2:  98%  95%  Weight: 86.5 kg (190 lb 11.2 oz)     Height:        Intake/Output Summary (Last 24 hours) at 12/06/2017 1409 Last data filed at 12/06/2017 1300 Gross per 24 hour  Intake 961 ml  Output 1750 ml  Net -789 ml   Filed Weights   12/03/17 0525 12/05/17 0447 12/06/17 0503  Weight:  83.8 kg (184 lb 11.9 oz) 82.7 kg (182 lb 6.4 oz) 86.5 kg (190 lb 11.2 oz)    Exam:   General: Alert, awake, oriented x3, no distress  Cardiovascular: S1-S2 present, no added heart sound  Respiratory: Chest clear bilaterally  Abdomen: Soft, nontender, nondistended, bowel sounds  present  Musculoskeletal: Bilateral arm swelling, worse on the right.  No pedal edema, no calf tenderness  Skin: No rashes noted  Psychiatry: Normal mood   Data Reviewed: CBC: Recent Labs  Lab 11/29/17 1756  12/01/17 0700 12/03/17 0209 12/04/17 0358 12/05/17 0248 12/06/17 0500  WBC 7.7   < > 7.0 4.9 3.8* 4.3 4.0  NEUTROABS 6.1  --  5.2  --   --   --  3.2  HGB 12.8   < > 11.0* 10.4* 9.6* 10.0* 10.5*  HCT 38.3   < > 33.0* 31.2* 28.7* 29.8* 31.7*  MCV 103.0*   < > 102.5* 103.3* 101.1* 102.1* 102.6*  PLT 291   < > 241 188 178 175 181   < > = values in this interval not displayed.   Basic Metabolic Panel: Recent Labs  Lab 11/30/17 0239 12/01/17 0700 12/04/17 0358 12/05/17 0248 12/06/17 0500  NA 137 134* 138 140 140  K 4.9 4.2 4.0 4.0 3.6  CL 99* 99* 104 107 104  CO2 23 24 25 24 26   GLUCOSE 113* 79 100* 90 70  BUN 15 23* 14 15 14   CREATININE 0.91 0.80 0.68 0.80 0.69  CALCIUM 10.3 9.2 10.6* 10.6* 10.9*  MG 1.4* 1.8  --   --  1.5*  PHOS 4.3  --   --   --  2.5   GFR: Estimated Creatinine Clearance: 59.7 mL/min (by C-G formula based on SCr of 0.69 mg/dL). Liver Function Tests: Recent Labs  Lab 11/29/17 1756 12/01/17 0700 12/04/17 0358 12/05/17 0248 12/06/17 0500  AST 53* 45* 36 44* 42*  ALT 22 21 22 25 27   ALKPHOS 74 60 59 62 66  BILITOT 0.6 0.5 0.5 0.4 0.3  PROT 6.4* 5.4* 4.9* 4.6* 4.9*  ALBUMIN 3.4* 2.9* 2.4* 2.4* 2.4*   No results for input(s): LIPASE, AMYLASE in the last 168 hours. No results for input(s): AMMONIA in the last 168 hours. Coagulation Profile: Recent Labs  Lab 12/02/17 0531 12/03/17 0209 12/04/17 0358 12/05/17 0248 12/06/17 0500  INR 1.76 1.45 1.36 1.33 1.44   Cardiac Enzymes: No results for input(s): CKTOTAL, CKMB, CKMBINDEX, TROPONINI in the last 168 hours. BNP (last 3 results) No results for input(s): PROBNP in the last 8760 hours. HbA1C: No results for input(s): HGBA1C in the last 72 hours. CBG: Recent Labs  Lab  12/05/17 1050 12/05/17 1827 12/06/17 0645 12/06/17 1139  GLUCAP 94 92 70 65   Lipid Profile: No results for input(s): CHOL, HDL, LDLCALC, TRIG, CHOLHDL, LDLDIRECT in the last 72 hours. Thyroid Function Tests: No results for input(s): TSH, T4TOTAL, FREET4, T3FREE, THYROIDAB in the last 72 hours. Anemia Panel: No results for input(s): VITAMINB12, FOLATE, FERRITIN, TIBC, IRON, RETICCTPCT in the last 72 hours. Urine analysis:    Component Value Date/Time   COLORURINE YELLOW 11/29/2017 2145   APPEARANCEUR CLEAR 11/29/2017 2145   LABSPEC 1.040 (H) 11/29/2017 2145   PHURINE 5.0 11/29/2017 2145   GLUCOSEU NEGATIVE 11/29/2017 2145   HGBUR SMALL (A) 11/29/2017 2145   BILIRUBINUR NEGATIVE 11/29/2017 2145   KETONESUR NEGATIVE 11/29/2017 2145   PROTEINUR NEGATIVE 11/29/2017 2145   UROBILINOGEN 0.2 08/11/2015 1244   NITRITE POSITIVE (A) 11/29/2017  2145   LEUKOCYTESUR MODERATE (A) 11/29/2017 2145   Sepsis Labs: @LABRCNTIP (procalcitonin:4,lacticidven:4)  ) Recent Results (from the past 240 hour(s))  Culture, blood (routine x 2)     Status: None   Collection Time: 11/30/17 12:15 AM  Result Value Ref Range Status   Specimen Description BLOOD LEFT HAND  Final   Special Requests   Final    BOTTLES DRAWN AEROBIC ONLY Blood Culture adequate volume   Culture NO GROWTH 5 DAYS  Final   Report Status 12/05/2017 FINAL  Final  Culture, blood (routine x 2)     Status: None   Collection Time: 11/30/17 12:25 AM  Result Value Ref Range Status   Specimen Description BLOOD LEFT WRIST  Final   Special Requests   Final    BOTTLES DRAWN AEROBIC ONLY Blood Culture adequate volume   Culture NO GROWTH 5 DAYS  Final   Report Status 12/05/2017 FINAL  Final      Studies: No results found.  Scheduled Meds: . allopurinol  300 mg Oral Daily  . arformoterol  15 mcg Nebulization BID  . budesonide (PULMICORT) nebulizer solution  0.25 mg Nebulization BID  . calcitonin (salmon)  1 spray Alternating Nares  Daily  . flecainide  100 mg Oral BID  . gabapentin  300 mg Oral TID  . levothyroxine  88 mcg Oral QAC breakfast  . lisinopril  20 mg Oral Daily  . metoprolol tartrate  12.5 mg Oral BID  . polyethylene glycol  17 g Oral Daily  . predniSONE  60 mg Oral Q breakfast  . senna-docusate  1 tablet Oral QHS  . Warfarin - Pharmacist Dosing Inpatient   Does not apply q1800    Continuous Infusions: . cefTRIAXone (ROCEPHIN)  IV Stopped (12/05/17 2245)  . dextrose 5 % and 0.9% NaCl    . heparin 1,150 Units/hr (12/04/17 2313)  . magnesium sulfate 1 - 4 g bolus IVPB       LOS: 6 days     Alma Friendly, MD [facility]  If 7PM-7AM, please contact night-coverage [email or web address] Password [email or web address] 12/06/2017, 2:09 PM

## 2017-12-06 NOTE — Progress Notes (Signed)
PT Cancellation Note  Patient Details Name: Colleen [name] MRN: [id number] DOB: Aug 12, 1935   Cancelled Treatment:    Reason Eval/Treat Not Completed: Medical issues which prohibited therapy Checked on pt for initial PT evaluation however resting HR is 130. Patient in no apparent distress. Apparently awaiting medication to manage HR. We'll check back this afternoon for formal evaluation. Patient repositioned to elevate RUE as tolerated 2/2 edema.   Ellouise Newer 12/06/2017, 9:47 AM  Elayne Snare, [place]

## 2017-12-07 LAB — PROTIME-INR
INR: 1.64
Prothrombin Time: 19.3 seconds — ABNORMAL HIGH (ref 11.4–15.2)

## 2017-12-07 LAB — CBC
HEMATOCRIT: 28.1 % — AB (ref 36.0–46.0)
HEMOGLOBIN: 9.5 g/dL — AB (ref 12.0–15.0)
MCH: 34.7 pg — AB (ref 26.0–34.0)
MCHC: 33.8 g/dL (ref 30.0–36.0)
MCV: 102.6 fL — ABNORMAL HIGH (ref 78.0–100.0)
Platelets: 163 10*3/uL (ref 150–400)
RBC: 2.74 MIL/uL — ABNORMAL LOW (ref 3.87–5.11)
RDW: 14.8 % (ref 11.5–15.5)
WBC: 4.2 10*3/uL (ref 4.0–10.5)

## 2017-12-07 LAB — COMPREHENSIVE METABOLIC PANEL
ALT: 26 U/L (ref 14–54)
AST: 35 U/L (ref 15–41)
Albumin: 2.2 g/dL — ABNORMAL LOW (ref 3.5–5.0)
Alkaline Phosphatase: 50 U/L (ref 38–126)
Anion gap: 6 (ref 5–15)
BUN: 14 mg/dL (ref 6–20)
CALCIUM: 9.2 mg/dL (ref 8.9–10.3)
CO2: 23 mmol/L (ref 22–32)
CREATININE: 0.57 mg/dL (ref 0.44–1.00)
Chloride: 110 mmol/L (ref 101–111)
Glucose, Bld: 422 mg/dL — ABNORMAL HIGH (ref 65–99)
Potassium: 3.2 mmol/L — ABNORMAL LOW (ref 3.5–5.1)
Sodium: 139 mmol/L (ref 135–145)
Total Bilirubin: 0.2 mg/dL — ABNORMAL LOW (ref 0.3–1.2)
Total Protein: 4.2 g/dL — ABNORMAL LOW (ref 6.5–8.1)

## 2017-12-07 LAB — GLUCOSE, CAPILLARY
Glucose-Capillary: 87 mg/dL (ref 65–99)
Glucose-Capillary: 99 mg/dL (ref 65–99)
Glucose-Capillary: 158 mg/dL — ABNORMAL HIGH (ref 65–99)
Glucose-Capillary: 153 mg/dL — ABNORMAL HIGH (ref 65–99)

## 2017-12-07 LAB — HEPARIN LEVEL (UNFRACTIONATED)
HEPARIN UNFRACTIONATED: 0.22 [IU]/mL — AB (ref 0.30–0.70)
Heparin Unfractionated: 1.09 IU/mL — ABNORMAL HIGH (ref 0.30–0.70)

## 2017-12-07 LAB — PTH, INTACT AND CALCIUM
Calcium, Total (PTH): 10.9 mg/dL — ABNORMAL HIGH (ref 8.7–10.3)
PTH: 19 pg/mL (ref 15–65)

## 2017-12-07 LAB — LACTATE DEHYDROGENASE: LDH: 312 U/L — AB (ref 98–192)

## 2017-12-07 LAB — URIC ACID: URIC ACID, SERUM: 1.1 mg/dL — AB (ref 2.3–6.6)

## 2017-12-07 MED ORDER — HEPARIN (PORCINE) IN NACL 100-0.45 UNIT/ML-% IJ SOLN
1000.0000 [IU]/h | INTRAMUSCULAR | Status: DC
  Administered 2017-12-07: 1150 [IU]/h via INTRAVENOUS
  Administered 2017-12-08: 1000 [IU]/h via INTRAVENOUS
  Filled 2017-12-07: qty 250

## 2017-12-07 MED ORDER — WARFARIN SODIUM 5 MG PO TABS
5.0000 mg | ORAL_TABLET | Freq: Once | ORAL | Status: AC
  Administered 2017-12-07: 5 mg via ORAL
  Filled 2017-12-07: qty 1

## 2017-12-07 MED ORDER — POTASSIUM CHLORIDE CRYS ER 20 MEQ PO TBCR
40.0000 meq | EXTENDED_RELEASE_TABLET | Freq: Two times a day (BID) | ORAL | Status: AC
  Administered 2017-12-07 – 2017-12-08 (×2): 40 meq via ORAL
  Filled 2017-12-07 (×2): qty 2

## 2017-12-07 NOTE — Progress Notes (Signed)
ANTICOAGULATION CONSULT NOTE - Initial Consult  Pharmacy Consult for heparin Indication: atrial fibrillation  No Known Allergies  Patient Measurements: Height: 5\' 4"  (162.6 cm) Weight: 194 lb 0.1 oz (88 kg) IBW/kg (Calculated) : 54.7 Heparin Dosing Weight: 70.5 kg  Vital Signs: Temp: 98.7 F (37.1 C) (12/16 0716) Temp Source: Oral (12/16 0716) BP: 158/74 (12/16 0716) Pulse Rate: 60 (12/16 0858)  Labs: Recent Labs    12/05/17 0248 12/06/17 0500 12/07/17 0500 12/07/17 0540  HGB 10.0* 10.5*  --  9.5*  HCT 29.8* 31.7*  --  28.1*  PLT 175 181  --  163  LABPROT 16.4* 17.5*  --  19.3*  INR 1.33 1.44  --  1.64  HEPARINUNFRC 0.41 0.35 0.22*  --   CREATININE 0.80 0.69  --   --     Estimated Creatinine Clearance: 60.2 mL/min (by C-G formula based on SCr of 0.69 mg/dL).   Medical History: Past Medical History:  Diagnosis Date  . Atrial fibrillation ([place])   . Carotid artery disease ([place])   . CHF (congestive heart failure) ([place])   . Hyperlipidemia   . Hypertension   . Hypothyroidism   . Shortness of breath   . TIA (transient ischemic attack) 2001    Medications:  Infusions:  . dextrose 5 % and 0.9% NaCl 100 mL/hr at 12/07/17 0300  . heparin 1,150 Units/hr (12/07/17 6389)    Assessment: 89 YOF with PTA warfarin held for biopsy.  Heparin initiated and warfarin restarted 12/13.   Heparin level is subtherapeutic this AM at 0.22.  No bleeding noted at this time. Goal of Therapy:  Heparin Level 0.3-0.7 Monitor platelets by anticoagulation protocol: Yes   Plan:  Increase heparin gtt to 1250 units/hr F/u 1800 heparin level Monitor daily heparin level, CBC and s/s bleeding  Bertis Ruddy M 12/07/2017,9:35 AM

## 2017-12-07 NOTE — Progress Notes (Signed)
PROGRESS NOTE  [place] STAMP VVO:[id number] DOB: [birth year] DOA: 11/29/2017 PCP: Ann Held, DO  HPI/Recap of past 24 hours: Pt. with PMH of A. fib on Coumadin, CHF, HTN, hypothyroidism, CVA; admitted on 11/29/2017, presented with complaint of right arm pain and swelling, SOB PTA, was found to have brachial neuritis with rapidly developing diffuse lymphadenopathy involving neck, axillae, mediastinum. Lymph node biopsy done on 12/02/17 showed likely diffuse large B-cell non Hodgkin's lymphoma. Hematology on board, started on pred and allopurinol 12/03/17 and plans for rituximab on 12/04/17.  Today, patient reported pain and swelling in R arm is much better, but cannot move the R arm at all. OT present in room while assessing patient. Pt is not in any resp distress and denies any difficulty breathing. Pt denies any chest pain, SOB, abdominal pain, fever/chills. Still tachycardic  Assessment/Plan: Active Problems:   Hypoxia   Lymphadenopathy   Acute cystitis without hematuria   Lactic acidosis   Malignant lymphomas of lymph nodes of head, face, and neck (HCC)  #Diffuse large B-cell non Hodgkin's Lymphoma New onset, LN biopsy on 12/02/17 + R arm pain, swelling, diffuse lymphadenopathy on presentation CT scan chest 11/24/2017 shows lymphadenopathy Hem/onc on board, Dr Jana Hakim, pathology from 12/02/17 which showed diffuse large B cell NHL Pred X 5 days and allopurinol on 12/03/17 and rituximab weekly, 1st dose on 12/04/17 Arm sling, as needed narcotics for pain management for now Monitor closely for tumor lysis syndrome (uric acid elevated, give a dose of rasburicase on 12/04/17) Follow up closely with Hem/onc outpt, plan is to start bendamustine or CHOP with infusional Adriamycin next week Continue IVF  #Concern for airway compromise with hypoxia. Stable, no resp distress Initially presented with hypoxia with midline shift of the trachea due to bulky lymphadenopathy CCM was  consulted, recommended stepdown admission  Monitor very closely, continuous pulse ox  #Right arm Lymphedema Right hand swelling, inability to move R hand MRI neck shows brachial plexus impingement likely brachial neuritis from lymphadenopathy OT on board, rec CIR  #Chronic A. Fib. Controlled On Coumadin, heparin bridging Continue flecainide, started low dose lopressor due to present HR  #Mild hypercalcemia Persistent Oncology on board, give calcitonin nasal spray X 2 doses, consider IV bisphosphonate if persistent Continue IV fluids Monitor, daily BMP  #UTI U/A showed mod leuko, pos nitrite, many bacteria UC never sent out S/P IV Rocephin for 6 days total  #Lactic acidosis 7.91->4.9 Likely due to aggressive lymphoma rather than hypoperfusion No signs of sepsis Continue IVF  #Essential hypertension BP controlled Monitor  #Hypothyroidism Continue Synthroid     Code Status: Full  Family Communication: None at bedside  Disposition Plan: SNF Vs CIR placement   Consultants:  Hem/onc  PCCM  IR  GS  Procedures:  IR guided needle biopsy   Antimicrobials:  S/P IV rocephin  DVT prophylaxis: Coumadin   Objective: Vitals:   12/06/17 2339 12/07/17 0516 12/07/17 0716 12/07/17 0858  BP: 111/80 116/63 (!) 158/74   Pulse: 65 (!) 56 (!) 58 60  Resp: 12 15 14    Temp: 98.2 F (36.8 C) 97.8 F (36.6 C) 98.7 F (37.1 C)   TempSrc: Oral Oral Oral   SpO2: 98% 99% 100%   Weight:  88 kg (194 lb 0.1 oz)    Height:        Intake/Output Summary (Last 24 hours) at 12/07/2017 1050 Last data filed at 12/07/2017 0903 Gross per 24 hour  Intake 2661.23 ml  Output 650 ml  Net 2011.23 ml   Filed Weights   12/05/17 0447 12/06/17 0503 12/07/17 0516  Weight: 82.7 kg (182 lb 6.4 oz) 86.5 kg (190 lb 11.2 oz) 88 kg (194 lb 0.1 oz)    Exam:   General: Alert, awake, oriented x3, no distress  Cardiovascular: S1-S2 present, no added heart  sound  Respiratory: Chest clear bilaterally  Abdomen: Soft, nontender, nondistended, bowel sounds present  Musculoskeletal: R arm swelling/tenderness, immobile, No pedal edema, no calf tenderness  Skin: No rashes noted  Psychiatry: Normal mood   Data Reviewed: CBC: Recent Labs  Lab 12/01/17 0700 12/03/17 0209 12/04/17 0358 12/05/17 0248 12/06/17 0500 12/07/17 0540  WBC 7.0 4.9 3.8* 4.3 4.0 4.2  NEUTROABS 5.2  --   --   --  3.2  --   HGB 11.0* 10.4* 9.6* 10.0* 10.5* 9.5*  HCT 33.0* 31.2* 28.7* 29.8* 31.7* 28.1*  MCV 102.5* 103.3* 101.1* 102.1* 102.6* 102.6*  PLT 241 188 178 175 181 623   Basic Metabolic Panel: Recent Labs  Lab 12/01/17 0700 12/04/17 0358 12/05/17 0248 12/06/17 0500  NA 134* 138 140 140  K 4.2 4.0 4.0 3.6  CL 99* 104 107 104  CO2 24 25 24 26   GLUCOSE 79 100* 90 70  BUN 23* 14 15 14   CREATININE 0.80 0.68 0.80 0.69  CALCIUM 9.2 10.6* 10.6* 10.9*  MG 1.8  --   --  1.5*  PHOS  --   --   --  2.5   GFR: Estimated Creatinine Clearance: 60.2 mL/min (by C-G formula based on SCr of 0.69 mg/dL). Liver Function Tests: Recent Labs  Lab 12/01/17 0700 12/04/17 0358 12/05/17 0248 12/06/17 0500  AST 45* 36 44* 42*  ALT 21 22 25 27   ALKPHOS 60 59 62 66  BILITOT 0.5 0.5 0.4 0.3  PROT 5.4* 4.9* 4.6* 4.9*  ALBUMIN 2.9* 2.4* 2.4* 2.4*   No results for input(s): LIPASE, AMYLASE in the last 168 hours. No results for input(s): AMMONIA in the last 168 hours. Coagulation Profile: Recent Labs  Lab 12/03/17 0209 12/04/17 0358 12/05/17 0248 12/06/17 0500 12/07/17 0540  INR 1.45 1.36 1.33 1.44 1.64   Cardiac Enzymes: No results for input(s): CKTOTAL, CKMB, CKMBINDEX, TROPONINI in the last 168 hours. BNP (last 3 results) No results for input(s): PROBNP in the last 8760 hours. HbA1C: No results for input(s): HGBA1C in the last 72 hours. CBG: Recent Labs  Lab 12/05/17 1050 12/05/17 1827 12/06/17 0645 12/06/17 1139 12/07/17 0609  GLUCAP 94 92 70  65 87   Lipid Profile: No results for input(s): CHOL, HDL, LDLCALC, TRIG, CHOLHDL, LDLDIRECT in the last 72 hours. Thyroid Function Tests: No results for input(s): TSH, T4TOTAL, FREET4, T3FREE, THYROIDAB in the last 72 hours. Anemia Panel: No results for input(s): VITAMINB12, FOLATE, FERRITIN, TIBC, IRON, RETICCTPCT in the last 72 hours. Urine analysis:    Component Value Date/Time   COLORURINE YELLOW 11/29/2017 2145   APPEARANCEUR CLEAR 11/29/2017 2145   LABSPEC 1.040 (H) 11/29/2017 2145   PHURINE 5.0 11/29/2017 2145   GLUCOSEU NEGATIVE 11/29/2017 2145   HGBUR SMALL (A) 11/29/2017 2145   BILIRUBINUR NEGATIVE 11/29/2017 2145   KETONESUR NEGATIVE 11/29/2017 2145   PROTEINUR NEGATIVE 11/29/2017 2145   UROBILINOGEN 0.2 08/11/2015 1244   NITRITE POSITIVE (A) 11/29/2017 2145   LEUKOCYTESUR MODERATE (A) 11/29/2017 2145   Sepsis Labs: @LABRCNTIP (procalcitonin:4,lacticidven:4)  ) Recent Results (from the past 240 hour(s))  Culture, blood (routine x 2)     Status: None   Collection  Time: 11/30/17 12:15 AM  Result Value Ref Range Status   Specimen Description BLOOD LEFT HAND  Final   Special Requests   Final    BOTTLES DRAWN AEROBIC ONLY Blood Culture adequate volume   Culture NO GROWTH 5 DAYS  Final   Report Status 12/05/2017 FINAL  Final  Culture, blood (routine x 2)     Status: None   Collection Time: 11/30/17 12:25 AM  Result Value Ref Range Status   Specimen Description BLOOD LEFT WRIST  Final   Special Requests   Final    BOTTLES DRAWN AEROBIC ONLY Blood Culture adequate volume   Culture NO GROWTH 5 DAYS  Final   Report Status 12/05/2017 FINAL  Final      Studies: No results found.  Scheduled Meds: . allopurinol  300 mg Oral Daily  . flecainide  100 mg Oral BID  . gabapentin  300 mg Oral TID  . levothyroxine  88 mcg Oral QAC breakfast  . lisinopril  20 mg Oral Daily  . metoprolol tartrate  12.5 mg Oral BID  . polyethylene glycol  17 g Oral Daily  . predniSONE   60 mg Oral Q breakfast  . senna-docusate  1 tablet Oral QHS  . Warfarin - Pharmacist Dosing Inpatient   Does not apply q1800    Continuous Infusions: . dextrose 5 % and 0.9% NaCl 100 mL/hr at 12/07/17 0300  . heparin 1,250 Units/hr (12/07/17 0947)     LOS: 7 days     Alma Friendly, MD [facility]  If 7PM-7AM, please contact night-coverage [email or web address] Password [email or web address] 12/07/2017, 10:50 AM

## 2017-12-07 NOTE — Plan of Care (Signed)
  Progressing Education: Knowledge of General Education information will improve 12/07/2017 0015 - Progressing by Blair Promise, RN Clinical Measurements: Ability to maintain clinical measurements within normal limits will improve 12/07/2017 0015 - Progressing by Blair Promise, RN Diagnostic test results will improve 12/07/2017 0015 - Progressing by Blair Promise, RN Activity: Risk for activity intolerance will decrease 12/07/2017 0015 - Progressing by Blair Promise, RN Pain Managment: General experience of comfort will improve 12/07/2017 0015 - Progressing by Blair Promise, RN Skin Integrity: Risk for impaired skin integrity will decrease 12/07/2017 0015 - Progressing by Blair Promise, RN Health Behavior/Discharge Planning: Ability to manage health-related needs will improve 12/07/2017 0015 - Progressing by Blair Promise, RN Coping: Level of anxiety will decrease 12/07/2017 0015 - Progressing by Blair Promise, RN Elimination: Will not experience complications related to bowel motility 12/07/2017 0015 - Progressing by Blair Promise, RN Will not experience complications related to urinary retention 12/07/2017 0015 - Progressing by Blair Promise, RN Cardiac: Ability to achieve and maintain adequate cardiopulmonary perfusion will improve 12/07/2017 0015 - Progressing by Blair Promise, RN

## 2017-12-07 NOTE — Evaluation (Signed)
Occupational Therapy Evaluation Patient Details Name: Colleen [name] MRN: [id number] DOB: Sep 11, 1935 Today's Date: 12/07/2017    History of Present Illness Pt. with PMH of A. fib, CHF, HTN, hypothyroidism, CVA; admitted on 11/29/2017, presented with complaint of right arm pain and swelling, SOB PTA, was found to have brachial neuritis with rapidly developing diffuse lymphadenopathy involving neck, axillae, mediastinum. Lymph node biopsy done on 12/02/17 showed likely diffuse large B-cell non Hodgkin's lymphoma.   Clinical Impression   PTA, pt was independent with ADL and functional mobility. She presents to OT with significant R UE edema associated with pain and decreased AROM in all planes as well as generalized B LE weakness and decreased activity tolerance for ADL significantly impacting her ability to participate in ADL at [facility]. She currently requires mod assist for self-feeding tasks, min assist for grooming, max assist for UB ADL, and total assist for LB ADL and toileting hygiene. Initiated education concerning HEP for edema management and improved active use of R UE and pt verbalizes/demonstrates understanding. Pt would benefit from continued OT services while admitted to improve independence with ADL and functional mobility while admitted. Recommend CIR level therapies post-acute D/C to maximize return to independence and feel pt will thrive with this intensity/duration of therapies. OT will continue to follow while admitted.     Follow Up Recommendations  CIR;Supervision/Assistance - 24 hour (lymphedema OT)   Equipment Recommendations  Other (comment)(defer to next venue of care)    Recommendations for Other Services Rehab consult     Precautions / Restrictions Precautions Precaution Comments: watch O2 Restrictions Weight Bearing Restrictions: No      Mobility Bed Mobility Overal bed mobility: Needs Assistance Bed Mobility: Supine to Sit     Supine to sit: Mod assist      General bed mobility comments: Assist to progress hips to EOB and raise trunk from [facility].   Transfers Overall transfer level: Needs assistance Equipment used: 1 person hand held assist(face to face) Transfers: Sit to/from [facility] Sit to Stand: Mod assist Stand pivot transfers: Min assist       General transfer comment: Able to rise to standing with mod assist with face to face method. Once standing, she was able to pivot with min assist.     Balance Overall balance assessment: Needs assistance Sitting-balance support: No upper extremity supported;Feet supported Sitting balance-Leahy Scale: Good     Standing balance support: No upper extremity supported Standing balance-Leahy Scale: Poor Standing balance comment: Relies on external assistance.                            ADL either performed or assessed with clinical judgement   ADL Overall ADL's : Needs assistance/impaired Eating/Feeding: Moderate assistance;Sitting   Grooming: Minimal assistance;Sitting   Upper Body Bathing: Sitting;Maximal assistance   Lower Body Bathing: Sit to/from stand;Total assistance   Upper Body Dressing : Sitting;Maximal assistance   Lower Body Dressing: Total assistance;Sit to/from stand   Toilet Transfer: Moderate assistance;Stand-pivot;BSC Toilet Transfer Details (indicate cue type and reason): Simulated from bed to chair.  Toileting- Clothing Manipulation and Hygiene: Total assistance;Sit to/from stand       Functional mobility during ADLs: Moderate assistance(stand-pivot only) General ADL Comments: Pt with generalized B LE weakness impacting mobility. Significant difficulty with bimanual tasks.      Vision Baseline Vision/History: Wears glasses Wears Glasses: At all times;Reading only(has 2 pairs: one for reading and the other all the time) Patient  Visual Report: No change from baseline Vision Assessment?: No apparent visual deficits Additional  Comments: Has glasses in room      Perception     Praxis      Pertinent Vitals/Pain Pain Assessment: Faces Faces Pain Scale: Hurts even more Pain Location: RUE when moved Pain Descriptors / Indicators: Aching Pain Intervention(s): Monitored during session;Repositioned;Limited activity within patient's tolerance     Hand Dominance Right   Extremity/Trunk Assessment Upper Extremity Assessment Upper Extremity Assessment: RUE deficits/detail RUE Deficits / Details: Significant edema (non-pitting) throughout which is significantly limiting active and passive movement. Passively able to achieve 0-15 degrees R shoulder forward flexion and abduction. Able to achieve 75% of composite fist and oppose thumb to digits II-IV. Difficulty opposing thumb to fifth digit but able to achieve at end of session.     Lower Extremity Assessment Lower Extremity Assessment: Generalized weakness       Communication Communication Communication: No difficulties   Cognition Arousal/Alertness: Awake/alert Behavior During Therapy: WFL for tasks assessed/performed Overall Cognitive Status: Within Functional Limits for tasks assessed                                 General Comments: Very pleasant and willing to try despite pain.   General Comments  SpO2 down to 88% on RA during transfer with quick rebound to 90%. Re-applied 2L O2 with pt improving to upper 90s.     Exercises Exercises: Other exercises Other Exercises Other Exercises: Educated pt on R composite grasp, R thumb opposition, gentle retrograde stroking/massage of R hand, R wrist flexion/extension AROM, R elbow flexion/extension AAROM, and R shoulder shrugs (in that order) 10 times each 3x/day.   Shoulder Instructions      Home Living Family/patient expects to be discharged to:: Inpatient rehab Living Arrangements: Spouse/significant other Available Help at Discharge: Family;Available 24 hours/day(husband limited  physically) Type of Home: House Home Access: Stairs to enter [employer] of Steps: 4   Home Layout: Multi-level;Bed/bath upstairs Alternate Level Stairs-Number of Steps: approx 9 steps with rails Alternate Level Stairs-Rails: Right;Left           Home Equipment: Walker - 2 wheels;Shower seat;Bedside commode;Wheelchair - manual          Prior Functioning/Environment Level of Independence: Independent                 OT Problem List: Decreased strength;Decreased range of motion;Decreased activity tolerance;Impaired balance (sitting and/or standing);Decreased safety awareness;Decreased knowledge of use of DME or AE;Decreased knowledge of precautions;Pain;Impaired UE functional use      OT Treatment/Interventions: Self-care/ADL training;Therapeutic exercise;Energy conservation;DME and/or AE instruction;Therapeutic activities;Patient/family education;Balance training;Manual therapy;Splinting;Modalities    OT Goals(Current goals can be found in the care plan section) Acute Rehab OT Goals Patient Stated Goal: Get to rehab to be able to take care of myself OT Goal Formulation: With patient Time For Goal Achievement: 12/21/17 Potential to Achieve Goals: Good ADL Goals Pt Will Perform Grooming: with min assist;sitting Pt Will Perform Upper Body Bathing: with min assist;sitting Pt Will Perform Lower Body Bathing: with mod assist;sit to/from stand Pt Will Transfer to Toilet: with min guard assist;ambulating;bedside commode(BSC over toilet) Pt Will Perform Toileting - Clothing Manipulation and hygiene: with min guard assist;sit to/from stand Pt/caregiver will Perform Home Exercise Program: Right Upper extremity;With written HEP provided;Increased ROM;Increased strength(increased ROM, increased strength, decreased edema)  OT Frequency: Min 3X/week   Barriers to D/C:  Co-evaluation              AM-PAC PT "6 Clicks" Daily Activity     Outcome Measure  Help from another person eating meals?: A Lot Help from another person taking care of personal grooming?: A Lot Help from another person toileting, which includes using toliet, bedpan, or urinal?: Total Help from another person bathing (including washing, rinsing, drying)?: A Lot Help from another person to put on and taking off regular upper body clothing?: A Lot Help from another person to put on and taking off regular lower body clothing?: A Lot 6 Click Score: 11   End of Session Equipment Utilized During Treatment: Gait belt Nurse Communication: Mobility status(pt up in chair)  Activity Tolerance: Patient tolerated treatment well Patient left: in chair;with call bell/phone within reach  OT Visit Diagnosis: Other abnormalities of gait and mobility (R26.89);Muscle weakness (generalized) (M62.81);Hemiplegia and hemiparesis Hemiplegia - Right/Left: Right Hemiplegia - dominant/non-dominant: Dominant Hemiplegia - caused by: (R UE lymphedema)                Time: 6761-9509 OT Time Calculation (min): 49 min Charges:  OT General Charges $OT Visit: 1 Visit OT Evaluation $OT Eval Moderate Complexity: 1 Mod OT Treatments $Self Care/Home Management : 8-22 mins $Therapeutic Activity: 8-22 mins G-Codes:     [name], MS OTR/L  Pager: [place] 12/07/2017, 8:52 AM

## 2017-12-07 NOTE — Plan of Care (Signed)
  Progressing Education: Knowledge of General Education information will improve 12/07/2017 2023 - Progressing by Blair Promise, RN Clinical Measurements: Ability to maintain clinical measurements within normal limits will improve 12/07/2017 2023 - Progressing by Blair Promise, RN Diagnostic test results will improve 12/07/2017 2023 - Progressing by Blair Promise, RN Activity: Risk for activity intolerance will decrease 12/07/2017 2023 - Progressing by Blair Promise, RN Pain Managment: General experience of comfort will improve 12/07/2017 2023 - Progressing by Blair Promise, RN Skin Integrity: Risk for impaired skin integrity will decrease 12/07/2017 2023 - Progressing by Blair Promise, RN Health Behavior/Discharge Planning: Ability to manage health-related needs will improve 12/07/2017 2023 - Progressing by Blair Promise, RN Coping: Level of anxiety will decrease 12/07/2017 2023 - Progressing by Blair Promise, RN Elimination: Will not experience complications related to urinary retention 12/07/2017 2023 - Progressing by Blair Promise, RN Cardiac: Ability to achieve and maintain adequate cardiopulmonary perfusion will improve 12/07/2017 2023 - Progressing by Blair Promise, RN

## 2017-12-07 NOTE — Progress Notes (Signed)
ANTICOAGULATION CONSULT NOTE - Follow Up Consult  Pharmacy Consult for heparin Indication: atrial fibrillation  No Known Allergies  Patient Measurements: Height: 5\' 4"  (162.6 cm) Weight: 194 lb 0.1 oz (88 kg) IBW/kg (Calculated) : 54.7 Heparin Dosing Weight: 70.5 kg  Vital Signs: Temp: 98.1 F (36.7 C) (12/16 1611) Temp Source: Oral (12/16 1611) BP: 158/80 (12/16 1611) Pulse Rate: 63 (12/16 1611)  Labs: Recent Labs    12/05/17 0248 12/06/17 0500 12/07/17 0500 12/07/17 0540 12/07/17 1800  HGB 10.0* 10.5*  --  9.5*  --   HCT 29.8* 31.7*  --  28.1*  --   PLT 175 181  --  163  --   LABPROT 16.4* 17.5*  --  19.3*  --   INR 1.33 1.44  --  1.64  --   HEPARINUNFRC 0.41 0.35 0.22*  --  1.09*  CREATININE 0.80 0.69  --  0.57  --     Estimated Creatinine Clearance: 60.2 mL/min (by C-G formula based on SCr of 0.57 mg/dL).   Medical History: Past Medical History:  Diagnosis Date  . Atrial fibrillation ([place])   . Carotid artery disease ([place])   . CHF (congestive heart failure) ([place])   . Hyperlipidemia   . Hypertension   . Hypothyroidism   . Shortness of breath   . TIA (transient ischemic attack) 2001    Medications:  Infusions:  . dextrose 5 % and 0.9% NaCl 100 mL/hr at 12/07/17 1300  . heparin 1,250 Units/hr (12/07/17 0947)    Assessment: 65 YOF with PTA warfarin held for biopsy.  Heparin initiated and warfarin restarted 12/13.   Patient has a double lumen PICC and heparin levels are being collected out of the same PICC line. Per RN, the heparin drip was paused for ~ 1 hour prior to this AM's heparin draw which could lead to falsely low levels. Heparin level tonight is high at 1.09. Spoke with RN who collected the level and she paused the heparin drip for a few minutes prior which is per protocol and suggests this level is accurate.   Goal of Therapy:  Heparin Level 0.3-0.7 Monitor platelets by anticoagulation protocol: Yes   Plan:  Pause heparin x 1 hour, then  resume at lower rate of 1150 units/hr F/u AM heparin level  Monitor daily heparin level, CBC and s/s bleeding  Albertina Parr, PharmD., BCPS Clinical Pharmacist Pager [phone]

## 2017-12-08 DIAGNOSIS — Z8673 Personal history of transient ischemic attack (TIA), and cerebral infarction without residual deficits: Secondary | ICD-10-CM

## 2017-12-08 DIAGNOSIS — I10 Essential (primary) hypertension: Secondary | ICD-10-CM

## 2017-12-08 DIAGNOSIS — D62 Acute posthemorrhagic anemia: Secondary | ICD-10-CM

## 2017-12-08 DIAGNOSIS — G540 Brachial plexus disorders: Secondary | ICD-10-CM

## 2017-12-08 DIAGNOSIS — M79601 Pain in right arm: Secondary | ICD-10-CM

## 2017-12-08 DIAGNOSIS — I480 Paroxysmal atrial fibrillation: Secondary | ICD-10-CM

## 2017-12-08 DIAGNOSIS — C8591 Non-Hodgkin lymphoma, unspecified, lymph nodes of head, face, and neck: Secondary | ICD-10-CM

## 2017-12-08 DIAGNOSIS — C8331 Diffuse large B-cell lymphoma, lymph nodes of head, face, and neck: Principal | ICD-10-CM

## 2017-12-08 DIAGNOSIS — I5032 Chronic diastolic (congestive) heart failure: Secondary | ICD-10-CM

## 2017-12-08 LAB — CBC
HCT: 30.9 % — ABNORMAL LOW (ref 36.0–46.0)
Hemoglobin: 10.2 g/dL — ABNORMAL LOW (ref 12.0–15.0)
MCH: 33.7 pg (ref 26.0–34.0)
MCHC: 33 g/dL (ref 30.0–36.0)
MCV: 102 fL — AB (ref 78.0–100.0)
Platelets: 185 10*3/uL (ref 150–400)
RBC: 3.03 MIL/uL — AB (ref 3.87–5.11)
RDW: 14.6 % (ref 11.5–15.5)
WBC: 5.1 10*3/uL (ref 4.0–10.5)

## 2017-12-08 LAB — COMPREHENSIVE METABOLIC PANEL
ALT: 53 U/L (ref 14–54)
AST: 64 U/L — AB (ref 15–41)
Albumin: 2.4 g/dL — ABNORMAL LOW (ref 3.5–5.0)
Alkaline Phosphatase: 68 U/L (ref 38–126)
Anion gap: 8 (ref 5–15)
BILIRUBIN TOTAL: 0.5 mg/dL (ref 0.3–1.2)
BUN: 10 mg/dL (ref 6–20)
CHLORIDE: 108 mmol/L (ref 101–111)
CO2: 24 mmol/L (ref 22–32)
CREATININE: 0.61 mg/dL (ref 0.44–1.00)
Calcium: 10.4 mg/dL — ABNORMAL HIGH (ref 8.9–10.3)
GFR calc Af Amer: >60 mL/min (ref >60)
Glucose, Bld: 91 mg/dL (ref 65–99)
Potassium: 4.1 mmol/L (ref 3.5–5.1)
Sodium: 140 mmol/L (ref 135–145)
TOTAL PROTEIN: 4.7 g/dL — AB (ref 6.5–8.1)

## 2017-12-08 LAB — GLUCOSE, CAPILLARY
GLUCOSE-CAPILLARY: 135 mg/dL — AB (ref 65–99)
GLUCOSE-CAPILLARY: 124 mg/dL — AB (ref 65–99)
Glucose-Capillary: 56 mg/dL — ABNORMAL LOW (ref 65–99)
Glucose-Capillary: 88 mg/dL (ref 65–99)
Glucose-Capillary: 86 mg/dL (ref 65–99)

## 2017-12-08 LAB — HEPARIN LEVEL (UNFRACTIONATED)
HEPARIN UNFRACTIONATED: 1.07 [IU]/mL — AB (ref 0.30–0.70)
HEPARIN UNFRACTIONATED: 0.62 [IU]/mL (ref 0.30–0.70)
Heparin Unfractionated: 1.01 IU/mL — ABNORMAL HIGH (ref 0.30–0.70)

## 2017-12-08 LAB — URIC ACID: Uric Acid, Serum: 1.7 mg/dL — ABNORMAL LOW (ref 2.3–6.6)

## 2017-12-08 LAB — CEA: CEA1: 2.1 ng/mL (ref 0.0–4.7)

## 2017-12-08 LAB — CANCER ANTIGEN 27.29: CA 27.29: 13.7 U/mL (ref 0.0–38.6)

## 2017-12-08 LAB — PROTIME-INR
INR: 1.81
Prothrombin Time: 20.8 seconds — ABNORMAL HIGH (ref 11.4–15.2)

## 2017-12-08 LAB — BETA 2 MICROGLOBULIN, SERUM: BETA 2 MICROGLOBULIN: 2.8 mg/L — AB (ref 0.6–2.4)

## 2017-12-08 LAB — LACTATE DEHYDROGENASE: LDH: 348 U/L — AB (ref 98–192)

## 2017-12-08 MED ORDER — HEPARIN (PORCINE) IN NACL 100-0.45 UNIT/ML-% IJ SOLN
800.0000 [IU]/h | INTRAMUSCULAR | Status: DC
  Administered 2017-12-08: 800 [IU]/h via INTRAVENOUS
  Filled 2017-12-08: qty 250

## 2017-12-08 MED ORDER — WARFARIN SODIUM 5 MG PO TABS
5.0000 mg | ORAL_TABLET | Freq: Once | ORAL | Status: AC
  Administered 2017-12-08: 5 mg via ORAL
  Filled 2017-12-08: qty 1

## 2017-12-08 NOTE — Progress Notes (Signed)
Physical Therapy Treatment Patient Details Name: Colleen [name] MRN: [id number] DOB: February 10, 1935 Today's Date: 12/08/2017    History of Present Illness Pt. with PMH of A. fib, CHF, HTN, hypothyroidism, CVA; admitted on 11/29/2017, presented with complaint of right arm pain and swelling, SOB PTA, was found to have brachial neuritis with rapidly developing diffuse lymphadenopathy involving neck, axillae, mediastinum. Lymph node biopsy done on 12/02/17 showed likely diffuse large B-cell non Hodgkin's lymphoma.    PT Comments    Pt sitting on BSC on arrival.  PTA assisted patient in pericare and encouraged patient to ambulate back to bed. Pt apprehensive but agreeable after encouragement.  Pt fatigues quickly but remains motivated.  Plan for CIR remains appropriate to rehab patient back to baseline to return home.    Follow Up Recommendations  CIR(Pending progress)     Equipment Recommendations  Other (comment)(TBD pending progress)    Recommendations for Other Services Rehab consult;OT consult     Precautions / Restrictions Precautions Precautions: (monitor) Precaution Comments: watch O2    Mobility  Bed Mobility Overal bed mobility: Needs Assistance Bed Mobility: Sit to Supine       Sit to supine: Min assist   General bed mobility comments: Pt required increased time and effort but able to lift B LEs against gravity unassisted with increased time.  LLE starting to fall out and min assist to maintain placement of LLE.  Pt required min assist to boost into supine to [facility].    Transfers Overall transfer level: Needs assistance Equipment used: Rolling walker (2 wheeled) Transfers: Sit to/from Stand   Stand pivot transfers: Min assist       General transfer comment: Cues for hand placement to and from seated surface.  Pt able to push from commode with LUE.  Once in standing patient able to place RUE hand on RW hand grip.  Ambulation/Gait Ambulation/Gait assistance: Min  assist Ambulation Distance (Feet): 10 Feet Assistive device: Rolling walker (2 wheeled) Gait Pattern/deviations: Step-through pattern;Trunk flexed;Narrow base of support;Shuffle   Gait velocity interpretation: Below normal speed for age/gender General Gait Details: Cues for sequencing and turning.  PTA assisted patient with balance and RW during turns and backing.     Stairs            Wheelchair Mobility    Modified Rankin (Stroke Patients Only)       Balance Overall balance assessment: Needs assistance   Sitting balance-Leahy Scale: Good       Standing balance-Leahy Scale: Poor Standing balance comment: Relies on external assistance.                             Cognition Arousal/Alertness: Awake/alert Behavior During Therapy: WFL for tasks assessed/performed Overall Cognitive Status: Within Functional Limits for tasks assessed                                 General Comments: Very pleasant and willing to try despite pain.      Exercises      General Comments        Pertinent Vitals/Pain Pain Assessment: Faces Faces Pain Scale: Hurts little more Pain Location: RUE when moved Pain Descriptors / Indicators: Aching Pain Intervention(s): Monitored during session;Repositioned    Home Living                      Prior Function  PT Goals (current goals can now be found in the care plan section) Acute Rehab PT Goals Patient Stated Goal: Get to rehab to be able to take care of myself Potential to Achieve Goals: Good Progress towards PT goals: Progressing toward goals    Frequency    Min 3X/week      PT Plan Current plan remains appropriate    Co-evaluation              AM-PAC PT "6 Clicks" Daily Activity  Outcome Measure  Difficulty turning over in bed (including adjusting bedclothes, sheets and blankets)?: Unable Difficulty moving from lying on back to sitting on the side of the bed? :  Unable Difficulty sitting down on and standing up from a chair with arms (e.g., wheelchair, bedside commode, etc,.)?: Unable Help needed moving to and from a bed to chair (including a wheelchair)?: A Little Help needed walking in hospital room?: A Little Help needed climbing 3-5 steps with a railing? : A Lot 6 Click Score: 11    End of Session Equipment Utilized During Treatment: Gait belt Activity Tolerance: Patient tolerated treatment well Patient left: with call bell/phone within reach;with nursing/sitter in room;with family/visitor present;in bed Nurse Communication: Mobility status PT Visit Diagnosis: Unsteadiness on feet (R26.81);Difficulty in walking, not elsewhere classified (R26.2);Muscle weakness (generalized) (M62.81);Pain Pain - Right/Left: Right Pain - part of body: Shoulder;Arm;Hand     Time: 3785-8850 PT Time Calculation (min) (ACUTE ONLY): 26 min  Charges:  $Gait Training: 8-22 mins $Therapeutic Activity: 8-22 mins                    G Codes:       Governor Rooks, PTA pager [phone]    Cristela Blue 12/08/2017, 6:22 PM

## 2017-12-08 NOTE — Progress Notes (Signed)
ANTICOAGULATION CONSULT NOTE - Follow Up Consult  Pharmacy Consult for heparin Indication: atrial fibrillation  Labs: Recent Labs    12/06/17 0500 12/07/17 0500 12/07/17 0540 12/07/17 1800 12/08/17 0247  HGB 10.5*  --  9.5*  --  10.2*  HCT 31.7*  --  28.1*  --  30.9*  PLT 181  --  163  --  185  LABPROT 17.5*  --  19.3*  --  20.8*  INR 1.44  --  1.64  --  1.81  HEPARINUNFRC 0.35 0.22*  --  1.09* 1.01*  CREATININE 0.69  --  0.57  --   --     Assessment: 82yo female remains above goal on heparin after rate change; RN reports that this lab was drawn peripherally from the same arm as PICC but below site.  Goal of Therapy:  Heparin level 0.3-0.7 units/ml   Plan:  Will decrease heparin gtt by 3 units/kg/hr to 1000 units/hr and check level in [place], PharmD, BCPS  12/08/2017,3:58 AM

## 2017-12-08 NOTE — Care Management Important Message (Signed)
Important Message  Patient Details  Name: Colleen [name] MRN: [id number] Date of Birth: 10/28/1935   Medicare Important Message Given:  Yes    Nathen May 12/08/2017, 10:11 AM

## 2017-12-08 NOTE — Consult Note (Signed)
Physical Medicine and Rehabilitation Consult Reason for Consult: Decreased functional mobility Referring Physician: [facility]   HPI: Colleen [name] is a 82 y.o. right handed female with history of atrial fibrillation with chronic Coumadin, diastolic congestive heart failure, hypertension, TIA and remote tobacco abuse. History taken from chart review and patient. Per report patient lives with spouse and son. Independent prior to admission. Multilevel home bath and bedroom upstairs. Spouse with limited physical assistance and spouse and son work during the day. Presented 11/29/2017 with complaints of right arm pain and swelling as well as recent reports of a mass in her right neck. CT of the chest showed significant adenopathy bilaterally. MRI soft tissue of the neck showed bulky cervical and included chest lymphadenopathy with right brachial plexus impingement. Lymph node biopsy 12/02/2017 showed likely diffuse large B-cell non-Hodgkin's lymphoma. Hematology consulted placed on prednisone/allopurinol. Weekly rituximab initiated 12/04/2017. Follow-up closely with hematology oncology plan was to start bendamustine or CHOP with infusional adrimycin within the next week. Chronic Coumadin has been resumed. Physical and occupational therapy evaluations completed with recommendations of physical medicine rehabilitation consult   Review of Systems  Constitutional: Positive for malaise/fatigue. Negative for chills and fever.  HENT: Negative for hearing loss.   Eyes: Negative for blurred vision and double vision.  Respiratory: Positive for shortness of breath.   Gastrointestinal: Positive for constipation. Negative for nausea and vomiting.  Genitourinary: Negative for dysuria, flank pain and hematuria.  Musculoskeletal: Positive for joint pain and myalgias.  Skin: Negative for rash.  Neurological: Positive for weakness. Negative for seizures.  All other systems reviewed and are negative.  Past Medical  History:  Diagnosis Date  . Atrial fibrillation ([place])   . Carotid artery disease ([place])   . CHF (congestive heart failure) ([place])   . Hyperlipidemia   . Hypertension   . Hypothyroidism   . Shortness of breath   . TIA (transient ischemic attack) 2001   Past Surgical History:  Procedure Laterality Date  . ABDOMINAL HYSTERECTOMY  1975   TAH,BSO  . CARDIAC CATHETERIZATION N/A 08/14/2015   Procedure: Right Heart Cath;  Surgeon: Larey Dresser, MD;  Location: [facility];  Service: Cardiovascular;  Laterality: N/A;  . carotid duplex  11/24/2012  . carotid duplex  11/01/2010  . CAROTID ENDARTERECTOMY  2001   left  . CHOLECYSTECTOMY    . DOPPLER ECHOCARDIOGRAPHY  02/13/2010   EF>55%  . DOPPLER ECHOCARDIOGRAPHY  09/29/2007  . NM MYOVIEW LTD  02/13/2010  . renal duplex  02/13/2010  . VIDEO BRONCHOSCOPY Bilateral 08/09/2015   Procedure: VIDEO BRONCHOSCOPY WITHOUT FLUORO;  Surgeon: Collene Gobble, MD;  Location: [facility];  Service: Cardiopulmonary;  Laterality: Bilateral;   Family History  Problem Relation Age of Onset  . Heart failure Mother   . Parkinson's disease Mother   . Arthritis Mother   . Arthritis Sister   . Colon cancer Brother   . Rheumatologic disease Neg Hx   . Lung disease Neg Hx    Social History:  reports that she quit smoking about 17 years ago. Her smoking use included cigarettes. She has a 47.00 pack-year smoking history. she has never used smokeless tobacco. She reports that she drinks alcohol. She reports that she does not use drugs. Allergies: No Known Allergies Medications Prior to Admission  Medication Sig Dispense Refill  . acetaminophen (TYLENOL) 500 MG tablet Take 1,000 mg by mouth every 6 (six) hours as needed for headache (pain).    Marland Kitchen  flecainide (TAMBOCOR) 100 MG tablet Take 50 mg by mouth 2 (two) times daily.    . furosemide (LASIX) 40 MG tablet Take 1 tablet (40 mg total) by mouth daily. Will take extra 40mg  PRN for swelling (Patient taking  differently: Take 40 mg by mouth See admin instructions. Take 1 tablet (40 mg) by mouth daily, may also take extra tablet (40mg ) as needed for ankle/ leg swelling) 30 tablet   . levothyroxine (SYNTHROID, LEVOTHROID) 88 MCG tablet Take 88 mcg by mouth daily before breakfast.     . lisinopril (PRINIVIL,ZESTRIL) 20 MG tablet Take 1 tablet (20 mg total) by mouth 2 (two) times daily. (Patient taking differently: Take 20 mg by mouth daily. ) 60 tablet 6  . warfarin (COUMADIN) 5 MG tablet Take 2.5-5 mg by mouth See admin instructions. Take 1 tablet (5 mg) by mouth on Monday and Friday after supper, take 1/2 tablet (2.5 mg) on Sunday, Tuesday, Wednesday, Thursday, Saturday with supper      Home: [place] expects to be discharged to:: Inpatient rehab Living Arrangements: Spouse/significant other Available Help at Discharge: Family, Available 24 hours/day(husband limited physically) Type of Home: House Home Access: Stairs to enter [employer] of Steps: 4 Home Layout: Multi-level, Bed/bath upstairs Alternate Level Stairs-Number of Steps: approx 9 steps with rails Alternate Level Stairs-Rails: Right, Left Home Equipment: Walker - 2 wheels, Shower seat, Bedside commode, Wheelchair - manual  Functional History: Prior Function Level of Independence: Independent Functional Status:  Mobility: Bed Mobility Overal bed mobility: Needs Assistance Bed Mobility: Supine to Sit Supine to sit: Mod assist General bed mobility comments: Assist to progress hips to EOB and raise trunk from [facility].  Transfers Overall transfer level: Needs assistance Equipment used: 1 person hand held assist(face to face) Transfers: Sit to/from Stand, Stand Pivot Transfers Sit to Stand: Mod assist Stand pivot transfers: Min assist General transfer comment: Able to rise to standing with mod assist with face to face method. Once standing, she was able to pivot with min assist.       ADL: ADL Overall  ADL's : Needs assistance/impaired Eating/Feeding: Moderate assistance, Sitting Grooming: Minimal assistance, Sitting Upper Body Bathing: Sitting, Maximal assistance Lower Body Bathing: Sit to/from stand, Total assistance Upper Body Dressing : Sitting, Maximal assistance Lower Body Dressing: Total assistance, Sit to/from stand Toilet Transfer: Moderate assistance, Stand-pivot, BSC Toilet Transfer Details (indicate cue type and reason): Simulated from bed to chair.  Toileting- Clothing Manipulation and Hygiene: Total assistance, Sit to/from stand Functional mobility during ADLs: Moderate assistance(stand-pivot only) General ADL Comments: Pt with generalized B LE weakness impacting mobility. Significant difficulty with bimanual tasks.   Cognition: Cognition Overall Cognitive Status: Within Functional Limits for tasks assessed Orientation Level: Oriented X4 Cognition Arousal/Alertness: Awake/alert Behavior During Therapy: WFL for tasks assessed/performed Overall Cognitive Status: Within Functional Limits for tasks assessed General Comments: Very pleasant and willing to try despite pain.  Blood pressure (!) 144/67, pulse (!) 55, temperature 97.8 F (36.6 C), temperature source Oral, resp. rate 13, height 5\' 4"  (1.626 m), weight 88 kg (194 lb 0.1 oz), SpO2 100 %. Physical Exam  Vitals reviewed. Constitutional: She is oriented to person, place, and time. She appears well-developed and well-nourished.  HENT:  Head: Normocephalic and atraumatic.  +[place]  Eyes: EOM are normal. Right eye exhibits no discharge. Left eye exhibits no discharge.  Neck: Normal range of motion. Neck supple. No thyromegaly present.  Cardiovascular: Regular rhythm.  +[place]  Respiratory: Effort normal and breath sounds normal. No  respiratory distress.  GI: Soft. Bowel sounds are normal. She exhibits no distension.  Musculoskeletal: She exhibits no tenderness.  LE edema  Neurological: She is alert and oriented to  person, place, and time.  Motor: RUE: Hand grip 4-/5, proximally limited by pain and apprehension LUE: 5/5 proximal to distal B/l lE:HF 4-/5, KE 4/5, ADF/PF 4+/5 Sensation intact to light touch  Skin: Skin is warm and dry.  Psychiatric: She has a normal mood and affect. Her behavior is normal.    Results for orders placed or performed during the hospital encounter of 11/29/17 (from the past 24 hour(s))  Glucose, capillary     Status: None   Collection Time: 12/07/17 10:55 AM  Result Value Ref Range   Glucose-Capillary 99 65 - 99 mg/dL  Glucose, capillary     Status: Abnormal   Collection Time: 12/07/17  5:41 PM  Result Value Ref Range   Glucose-Capillary 158 (H) 65 - 99 mg/dL  Heparin level (unfractionated)     Status: Abnormal   Collection Time: 12/07/17  6:00 PM  Result Value Ref Range   Heparin Unfractionated 1.09 (H) 0.30 - 0.70 IU/mL  Glucose, capillary     Status: Abnormal   Collection Time: 12/07/17  8:26 PM  Result Value Ref Range   Glucose-Capillary 153 (H) 65 - 99 mg/dL  Protime-INR     Status: Abnormal   Collection Time: 12/08/17  2:47 AM  Result Value Ref Range   Prothrombin Time 20.8 (H) 11.4 - 15.2 seconds   INR 1.81   CBC     Status: Abnormal   Collection Time: 12/08/17  2:47 AM  Result Value Ref Range   WBC 5.1 4.0 - 10.5 K/uL   RBC 3.03 (L) 3.87 - 5.11 MIL/uL   Hemoglobin 10.2 (L) 12.0 - 15.0 g/dL   HCT 30.9 (L) 36.0 - 46.0 %   MCV 102.0 (H) 78.0 - 100.0 fL   MCH 33.7 26.0 - 34.0 pg   MCHC 33.0 30.0 - 36.0 g/dL   RDW 14.6 11.5 - 15.5 %   Platelets 185 150 - 400 K/uL  Heparin level (unfractionated)     Status: Abnormal   Collection Time: 12/08/17  2:47 AM  Result Value Ref Range   Heparin Unfractionated 1.01 (H) 0.30 - 0.70 IU/mL  Comprehensive metabolic panel     Status: Abnormal   Collection Time: 12/08/17  2:47 AM  Result Value Ref Range   Sodium 140 135 - 145 mmol/L   Potassium 4.1 3.5 - 5.1 mmol/L   Chloride 108 101 - 111 mmol/L   CO2 24 22 -  32 mmol/L   Glucose, Bld 91 65 - 99 mg/dL   BUN 10 6 - 20 mg/dL   Creatinine, Ser 0.61 0.44 - 1.00 mg/dL   Calcium 10.4 (H) 8.9 - 10.3 mg/dL   Total Protein 4.7 (L) 6.5 - 8.1 g/dL   Albumin 2.4 (L) 3.5 - 5.0 g/dL   AST 64 (H) 15 - 41 U/L   ALT 53 14 - 54 U/L   Alkaline Phosphatase 68 38 - 126 U/L   Total Bilirubin 0.5 0.3 - 1.2 mg/dL   GFR calc non Af Amer >60 >60 mL/min   GFR calc Af Amer >60 >60 mL/min   Anion gap 8 5 - 15  Lactate dehydrogenase     Status: Abnormal   Collection Time: 12/08/17  2:47 AM  Result Value Ref Range   LDH 348 (H) 98 - 192 U/L  Uric acid  Status: Abnormal   Collection Time: 12/08/17  2:47 AM  Result Value Ref Range   Uric Acid, Serum 1.7 (L) 2.3 - 6.6 mg/dL  Glucose, capillary     Status: Abnormal   Collection Time: 12/08/17  6:02 AM  Result Value Ref Range   Glucose-Capillary 56 (L) 65 - 99 mg/dL   No results found.  Assessment/Plan: Diagnosis: diffuse large B-cell non-Hodgkin's lymphoma Labs independently reviewed.  Records reviewed and summated above.  1. Does the need for close, 24 hr/day medical supervision in concert with the patient's rehab needs make it unreasonable for this patient to be served in a less intensive setting? Yes  2. Co-Morbidities requiring supervision/potential complications: diffuse large B-cell non-Hodgkin's lymphoma (meds per Heme/Onc), atrial fibrillation (monitor HR with increased mobility, tranistion from heparin ggt when appropriate), diastolic congestive heart failure (monitor for signs/symtpoms of fluid overload), HTN (monitor and provide prns in accordance with increased physical exertion and pain), TIA, remote tobacco abuse (counsel), ABLA (transfuse if necessary to ensure appropriate perfusion for increased activity tolerance), brachial plexopathy (cont meds for pain, follow up for shrinking of tumor) 3. Due to safety, disease management, pain management and patient education, does the patient require 24 hr/day  rehab nursing? Yes 4. Does the patient require coordinated care of a physician, rehab nurse, PT (1-2 hrs/day, 5 days/week) and OT (1-2 hrs/day, 5 days/week) to address physical and functional deficits in the context of the above medical diagnosis(es)? Yes Addressing deficits in the following areas: balance, endurance, locomotion, strength, transferring, bathing, dressing, toileting and psychosocial support 5. Can the patient actively participate in an intensive therapy program of at least 3 hrs of therapy per day at least 5 days per week? Potentially 6. The potential for patient to make measurable gains while on inpatient rehab is excellent 7. Anticipated functional outcomes upon discharge from inpatient rehab are supervision and min assist  with PT, supervision and min assist with OT, n/a with SLP. 8. Estimated rehab length of stay to reach the above functional goals is: 8-13 days. 9. Anticipated D/C setting: Home 10. Anticipated post D/C treatments: HH therapy and Home excercise program 11. Overall Rehab/Functional Prognosis: good  RECOMMENDATIONS: This patient's condition is appropriate for continued rehabilitative care in the following setting: CIR once medically stable and able to tolerate 3 hours of therapy/day Patient has agreed to participate in recommended program. Yes Note that insurance prior authorization may be required for reimbursement for recommended care.  Comment: Rehab Admissions Coordinator to follow up.  Delice Lesch, MD, ABPMR Lavon Paganini Angiulli, PA-C 12/08/2017

## 2017-12-08 NOTE — Progress Notes (Signed)
PROGRESS NOTE  BERNESE DOFFING FAO:[id number] DOB: March 31, 1935 DOA: 11/29/2017 PCP: Ann Held, DO  HPI/Recap of past 24 hours: Pt. with PMH of A. fib on Coumadin, CHF, HTN, hypothyroidism, CVA; admitted on 11/29/2017, presented with complaint of right arm pain and swelling, SOB PTA, was found to have brachial neuritis with rapidly developing diffuse lymphadenopathy involving neck, axillae, mediastinum. Lymph node biopsy done on 12/02/17 showed likely diffuse large B-cell non Hodgkin's lymphoma. Hematology on board, started on pred and allopurinol 12/03/17 and plans for rituximab on 12/04/17.  Today, patient reported pain in R arm is much better, but cannot move the R arm at all. Pt is not in any resp distress and denies any difficulty breathing. Pt denies any chest pain, SOB, abdominal pain, fever/chills.  Assessment/Plan: Active Problems:   Hypoxia   Lymphadenopathy   Acute cystitis without hematuria   Lactic acidosis   Malignant lymphomas of lymph nodes of head, face, and neck (HCC)  #Diffuse large B-cell non Hodgkin's Lymphoma New onset, LN biopsy on 12/02/17 + R arm pain, swelling, diffuse lymphadenopathy on presentation CT scan chest 11/24/2017 shows lymphadenopathy Hem/onc on board, Dr Jana Hakim, pathology from 12/02/17 which showed diffuse large B cell NHL S/P Pred X 5 days, completed on 12/08/17, oncology to determine if pt still needs steroids Continue allopurinol Rituximab weekly, 1st dose on 12/04/17, next on 12/11/17 Arm sling, as needed narcotics for pain management for now Monitor closely for tumor lysis syndrome (uric acid elevated, give a dose of rasburicase on 12/04/17) Follow up closely with Hem/onc outpt, plan is to start bendamustine or CHOP with infusional Adriamycin next week Continue IVF  #Concern for airway compromise with hypoxia. Stable, no resp distress Initially presented with hypoxia with midline shift of the trachea due to bulky  lymphadenopathy CCM was consulted, recommended stepdown admission  Monitor very closely, continuous pulse ox  #Right arm Lymphedema Right hand swelling, inability to move R hand MRI neck shows brachial plexus impingement likely brachial neuritis from lymphadenopathy OT on board, rec CIR, consult placed  #Chronic A. Fib. Controlled On Coumadin, heparin bridging Continue flecainide, started low dose lopressor due to persistent tachycardia  #Mild hypercalcemia Persistent Oncology on board, give calcitonin nasal spray X 2 doses, consider IV bisphosphonate if persistent Continue IV fluids Monitor, daily BMP  #UTI U/A showed mod leuko, pos nitrite, many bacteria UC never sent out S/P IV Rocephin for 6 days total  #Lactic acidosis 7.91->4.9 Likely due to aggressive lymphoma rather than hypoperfusion No signs of sepsis Continue IVF  #Essential hypertension BP controlled Monitor  #Hypothyroidism Continue Synthroid     Code Status: Full  Family Communication: None at bedside  Disposition Plan: SNF Vs CIR placement   Consultants:  Hem/onc  PCCM  IR  GS  Procedures:  IR guided needle biopsy   Antimicrobials:  S/P IV rocephin  DVT prophylaxis: Coumadin   Objective: Vitals:   12/07/17 2341 12/08/17 0432 12/08/17 0440 12/08/17 0743  BP: (!) 147/88 (!) 144/67  (!) 165/79  Pulse: (!) 114 (!) 55  61  Resp: 14 13  (!) 21  Temp: 98.8 F (37.1 C) 97.8 F (36.6 C)  97.9 F (36.6 C)  TempSrc: Oral Oral  Oral  SpO2: 95% 100%  98%  Weight:   88 kg (194 lb 0.1 oz)   Height:        Intake/Output Summary (Last 24 hours) at 12/08/2017 1044 Last data filed at 12/08/2017 0800 Gross per 24 hour  Intake 2554.09  ml  Output 1300 ml  Net 1254.09 ml   Filed Weights   12/06/17 0503 12/07/17 0516 12/08/17 0440  Weight: 86.5 kg (190 lb 11.2 oz) 88 kg (194 lb 0.1 oz) 88 kg (194 lb 0.1 oz)    Exam:   General: Alert, awake, oriented x3, no  distress  Cardiovascular: S1-S2 present, no added heart sound  Respiratory: Chest clear bilaterally  Abdomen: Soft, nontender, nondistended, bowel sounds present  Musculoskeletal: R arm swelling/tenderness, immobile, No pedal edema, no calf tenderness  Skin: No rashes noted  Psychiatry: Normal mood   Data Reviewed: CBC: Recent Labs  Lab 12/04/17 0358 12/05/17 0248 12/06/17 0500 12/07/17 0540 12/08/17 0247  WBC 3.8* 4.3 4.0 4.2 5.1  NEUTROABS  --   --  3.2  --   --   HGB 9.6* 10.0* 10.5* 9.5* 10.2*  HCT 28.7* 29.8* 31.7* 28.1* 30.9*  MCV 101.1* 102.1* 102.6* 102.6* 102.0*  PLT 178 175 181 163 673   Basic Metabolic Panel: Recent Labs  Lab 12/04/17 0358 12/05/17 0248 12/06/17 0500 12/07/17 0540 12/08/17 0247  NA 138 140 140 139 140  K 4.0 4.0 3.6 3.2* 4.1  CL 104 107 104 110 108  CO2 25 24 26 23 24   GLUCOSE 100* 90 70 422* 91  BUN 14 15 14 14 10   CREATININE 0.68 0.80 0.69 0.57 0.61  CALCIUM 10.6* 10.6* 10.9*  10.9* 9.2 10.4*  MG  --   --  1.5*  --   --   PHOS  --   --  2.5  --   --    GFR: Estimated Creatinine Clearance: 60.2 mL/min (by C-G formula based on SCr of 0.61 mg/dL). Liver Function Tests: Recent Labs  Lab 12/04/17 0358 12/05/17 0248 12/06/17 0500 12/07/17 0540 12/08/17 0247  AST 36 44* 42* 35 64*  ALT 22 25 27 26  53  ALKPHOS 59 62 66 50 68  BILITOT 0.5 0.4 0.3 0.2* 0.5  PROT 4.9* 4.6* 4.9* 4.2* 4.7*  ALBUMIN 2.4* 2.4* 2.4* 2.2* 2.4*   No results for input(s): LIPASE, AMYLASE in the last 168 hours. No results for input(s): AMMONIA in the last 168 hours. Coagulation Profile: Recent Labs  Lab 12/04/17 0358 12/05/17 0248 12/06/17 0500 12/07/17 0540 12/08/17 0247  INR 1.36 1.33 1.44 1.64 1.81   Cardiac Enzymes: No results for input(s): CKTOTAL, CKMB, CKMBINDEX, TROPONINI in the last 168 hours. BNP (last 3 results) No results for input(s): PROBNP in the last 8760 hours. HbA1C: No results for input(s): HGBA1C in the last 72  hours. CBG: Recent Labs  Lab 12/07/17 1055 12/07/17 1741 12/07/17 2026 12/08/17 0602 12/08/17 0752  GLUCAP 99 158* 153* 56* 88   Lipid Profile: No results for input(s): CHOL, HDL, LDLCALC, TRIG, CHOLHDL, LDLDIRECT in the last 72 hours. Thyroid Function Tests: No results for input(s): TSH, T4TOTAL, FREET4, T3FREE, THYROIDAB in the last 72 hours. Anemia Panel: No results for input(s): VITAMINB12, FOLATE, FERRITIN, TIBC, IRON, RETICCTPCT in the last 72 hours. Urine analysis:    Component Value Date/Time   COLORURINE YELLOW 11/29/2017 2145   APPEARANCEUR CLEAR 11/29/2017 2145   LABSPEC 1.040 (H) 11/29/2017 2145   PHURINE 5.0 11/29/2017 2145   GLUCOSEU NEGATIVE 11/29/2017 2145   HGBUR SMALL (A) 11/29/2017 2145   BILIRUBINUR NEGATIVE 11/29/2017 2145   KETONESUR NEGATIVE 11/29/2017 2145   PROTEINUR NEGATIVE 11/29/2017 2145   UROBILINOGEN 0.2 08/11/2015 1244   NITRITE POSITIVE (A) 11/29/2017 2145   LEUKOCYTESUR MODERATE (A) 11/29/2017 2145   Sepsis  Labs: @LABRCNTIP (procalcitonin:4,lacticidven:4)  ) Recent Results (from the past 240 hour(s))  Culture, blood (routine x 2)     Status: None   Collection Time: 11/30/17 12:15 AM  Result Value Ref Range Status   Specimen Description BLOOD LEFT HAND  Final   Special Requests   Final    BOTTLES DRAWN AEROBIC ONLY Blood Culture adequate volume   Culture NO GROWTH 5 DAYS  Final   Report Status 12/05/2017 FINAL  Final  Culture, blood (routine x 2)     Status: None   Collection Time: 11/30/17 12:25 AM  Result Value Ref Range Status   Specimen Description BLOOD LEFT WRIST  Final   Special Requests   Final    BOTTLES DRAWN AEROBIC ONLY Blood Culture adequate volume   Culture NO GROWTH 5 DAYS  Final   Report Status 12/05/2017 FINAL  Final      Studies: No results found.  Scheduled Meds: . allopurinol  300 mg Oral Daily  . flecainide  100 mg Oral BID  . gabapentin  300 mg Oral TID  . levothyroxine  88 mcg Oral QAC breakfast  .  lisinopril  20 mg Oral Daily  . metoprolol tartrate  12.5 mg Oral BID  . polyethylene glycol  17 g Oral Daily  . predniSONE  60 mg Oral Q breakfast  . senna-docusate  1 tablet Oral QHS  . warfarin  5 mg Oral ONCE-1800  . Warfarin - Pharmacist Dosing Inpatient   Does not apply q1800    Continuous Infusions: . dextrose 5 % and 0.9% NaCl 100 mL/hr at 12/08/17 0934  . heparin 1,000 Units/hr (12/08/17 0934)     LOS: 8 days     Alma Friendly, MD [facility]  If 7PM-7AM, please contact night-coverage [email or web address] Password [facility] 12/08/2017, 10:44 AM

## 2017-12-08 NOTE — Progress Notes (Signed)
Follow-up note:  No evidence of tumor lysis. Patient now off prednisone and CBGs can be stopped.  She is scheduled for outpatient rituximab 12/21 at the [facility], 8 AM appt. Further therapy to follow.  She will see me 12/21.  May discharge at your discretion.  Greatly appreciate your help with this case. Will see tomorrow AM unless transfered to SNF

## 2017-12-08 NOTE — Progress Notes (Signed)
Lab collected blood for CBC, CMP, INR, Hep level, Uric Acid and Lactate Dehydrogenase

## 2017-12-08 NOTE — Clinical Social Work Note (Signed)
Clinical Social Work Assessment  Patient Details  Name: Colleen [name] MRN: [id number] Date of Birth: August 30, 1935  Date of referral:  12/08/17               Reason for consult:  Discharge Planning, Facility Placement                Permission sought to share information with:  Family Supports Permission granted to share information::  Yes, Verbal Permission Granted  Name::     Keamber Macfadden  Agency::  snf  Relationship::  spouse  Contact Information:  [phone]  Housing/Transportation Living arrangements for the past 2 months:  Single Family Home Source of Information:  Patient Patient Interpreter Needed:  None Criminal Activity/Legal Involvement Pertinent to Current Situation/Hospitalization:  No - Comment as needed Significant Relationships:  Spouse, Friend, Adult Children, Other Family Members Lives with:  Self, Adult Children Do you feel safe going back to the place where you live?  Yes Need for family participation in patient care:  Yes (Comment)  Care giving concerns:  Patient lives with spouse and adult son. patient stated she has a lot of support from family members. Half way through assessment patients niece/nephew came into the room.    Social Worker assessment / plan:  CSW met patient at bedside to discuss disposition plan. Patient stated she has a lot of support from family but adult children work and so does spouse. Patient stated she would like to be able to discharge to CIR but will be okay with SNF as a back up. Patient stated she would prefer to stay in the [place] area to be able to be close to her support system. Patient is aware that due to her receiving chemo therapy it will be a challenge to get her into a SNF. CSW to follow patient  Employment status:  Retired Nurse, adult PT Recommendations:  Inpatient [place] / Referral to community resources:  [place]  Patient/Family's Response to care:   Patient stated she is truly appreciative of cone assistance in her care.  Patient/Family's Understanding of and Emotional Response to Diagnosis, Current Treatment, and Prognosis:  Patient is aware of her limitations and is willing to do rehab to be able to discharge back home   Emotional Assessment Appearance:  Appears stated age Attitude/Demeanor/Rapport:  Other Affect (typically observed):  Pleasant Orientation:  Oriented to Self, Oriented to Place, Oriented to  Time, Oriented to Situation Alcohol / Substance use:  Not Applicable Psych involvement (Current and /or in the community):  No (Comment)  Discharge Needs  Concerns to be addressed:  No discharge needs identified Readmission within the last 30 days:  No Current discharge risk:  None Barriers to Discharge:  No Barriers Identified   Wende Neighbors, LCSW 12/08/2017, 4:09 PM

## 2017-12-08 NOTE — Progress Notes (Addendum)
ANTICOAGULATION CONSULT NOTE   Pharmacy Consult for Heparin + Warfarin Indication: atrial fibrillation  No Known Allergies  Patient Measurements: Height: 5\' 4"  (162.6 cm) Weight: 194 lb 0.1 oz (88 kg) IBW/kg (Calculated) : 54.7  Heparin dosing weight = 70.5 kg  Vital Signs: Temp: 97.9 F (36.6 C) (12/17 0743) Temp Source: Oral (12/17 0743) BP: 165/79 (12/17 0743) Pulse Rate: 61 (12/17 0743)  Labs: Recent Labs    12/06/17 0500 12/07/17 0500 12/07/17 0540 12/07/17 1800 12/08/17 0247  HGB 10.5*  --  9.5*  --  10.2*  HCT 31.7*  --  28.1*  --  30.9*  PLT 181  --  163  --  185  LABPROT 17.5*  --  19.3*  --  20.8*  INR 1.44  --  1.64  --  1.81  HEPARINUNFRC 0.35 0.22*  --  1.09* 1.01*  CREATININE 0.69  --  0.57  --  0.61   Estimated Creatinine Clearance: 60.2 mL/min (by C-G formula based on SCr of 0.61 mg/dL).  Assessment: 82 y/o female admitted with lymphadenopathy requiring biopsy. She is on chronic warfarin for history of afib but this was held for biopsy. Heparin initiated and warfarin was restarted 12/13. Heparin adjusted for high level this AM - repeat level pending. INR with slow rise up to 1.81 - dose missed 12/15. CBC stable. No bleed documented.  Home coumadin dose: 2.5mg  daily except 5mg  MF  Goal of Therapy:  INR 2-3 Heparin level 0.3-0.7 units/ml Monitor platelets by anticoagulation protocol: Yes   Plan:  -Continue heparin at 1000 units/hr -Coumadin 5mg  PO x 1 -Daily heparin level/CBC/INR -Monitor for s/sx bleeding  Elicia Lamp, PharmD, BCPS Clinical Pharmacist Clinical phone for 12/08/2017 until 3:30pm: [phone] If after 3:30pm, please call main pharmacy at: [phone] 12/08/2017 10:35 AM   ADDENDUM:  Heparin level remains elevated at 1.07 after rate decrease. Phlebotomist reports that this lab was drawn peripherally from the same arm as PICC but below site. No bleed or IV line issues reported per RN.  Plan: Hold heparin x 1 hour Resume heparin at  decreased rate 800 units/hr at 1530 8h heparin level Daily heparin level/CBC/INR Monitor for s/sx bleeding  Elicia Lamp, PharmD, BCPS Clinical Pharmacist Clinical phone for 12/08/2017 until 3:30pm: [phone] If after 3:30pm, please call main pharmacy at: [phone] 12/08/2017 2:00 PM

## 2017-12-09 ENCOUNTER — Inpatient Hospital Stay (HOSPITAL_COMMUNITY)
Admission: RE | Admit: 2017-12-09 | Discharge: 2017-12-27 | DRG: 947 | Disposition: A | Discharge status: Other Acute Inpatient Hospital | Payer: Medicare Other | Source: Intra-hospital | Attending: Physical Medicine & Rehabilitation | Admitting: Physical Medicine & Rehabilitation

## 2017-12-09 ENCOUNTER — Other Ambulatory Visit: Payer: Self-pay | Admitting: Oncology

## 2017-12-09 ENCOUNTER — Other Ambulatory Visit (HOSPITAL_COMMUNITY): Payer: Medicare Other

## 2017-12-09 ENCOUNTER — Encounter (HOSPITAL_COMMUNITY): Payer: Self-pay | Admitting: *Deleted

## 2017-12-09 DIAGNOSIS — E785 Hyperlipidemia, unspecified: Secondary | ICD-10-CM | POA: Diagnosis present

## 2017-12-09 DIAGNOSIS — I5032 Chronic diastolic (congestive) heart failure: Secondary | ICD-10-CM | POA: Diagnosis present

## 2017-12-09 DIAGNOSIS — I890 Lymphedema, not elsewhere classified: Secondary | ICD-10-CM

## 2017-12-09 DIAGNOSIS — Z6838 Body mass index (BMI) 38.0-38.9, adult: Secondary | ICD-10-CM

## 2017-12-09 DIAGNOSIS — R339 Retention of urine, unspecified: Secondary | ICD-10-CM | POA: Diagnosis not present

## 2017-12-09 DIAGNOSIS — R0681 Apnea, not elsewhere classified: Secondary | ICD-10-CM

## 2017-12-09 DIAGNOSIS — Z8249 Family history of ischemic heart disease and other diseases of the circulatory system: Secondary | ICD-10-CM | POA: Diagnosis not present

## 2017-12-09 DIAGNOSIS — E8809 Other disorders of plasma-protein metabolism, not elsewhere classified: Secondary | ICD-10-CM | POA: Diagnosis present

## 2017-12-09 DIAGNOSIS — D62 Acute posthemorrhagic anemia: Secondary | ICD-10-CM | POA: Diagnosis present

## 2017-12-09 DIAGNOSIS — R609 Edema, unspecified: Secondary | ICD-10-CM | POA: Diagnosis not present

## 2017-12-09 DIAGNOSIS — E669 Obesity, unspecified: Secondary | ICD-10-CM | POA: Diagnosis present

## 2017-12-09 DIAGNOSIS — Z800 Family history of malignant neoplasm of digestive organs: Secondary | ICD-10-CM

## 2017-12-09 DIAGNOSIS — I4891 Unspecified atrial fibrillation: Secondary | ICD-10-CM | POA: Diagnosis not present

## 2017-12-09 DIAGNOSIS — E871 Hypo-osmolality and hyponatremia: Secondary | ICD-10-CM | POA: Diagnosis present

## 2017-12-09 DIAGNOSIS — Z8261 Family history of arthritis: Secondary | ICD-10-CM

## 2017-12-09 DIAGNOSIS — R591 Generalized enlarged lymph nodes: Secondary | ICD-10-CM

## 2017-12-09 DIAGNOSIS — E039 Hypothyroidism, unspecified: Secondary | ICD-10-CM | POA: Diagnosis present

## 2017-12-09 DIAGNOSIS — I482 Chronic atrial fibrillation: Secondary | ICD-10-CM | POA: Diagnosis present

## 2017-12-09 DIAGNOSIS — M5412 Radiculopathy, cervical region: Secondary | ICD-10-CM | POA: Diagnosis present

## 2017-12-09 DIAGNOSIS — A609 Anogenital herpesviral infection, unspecified: Secondary | ICD-10-CM | POA: Diagnosis present

## 2017-12-09 DIAGNOSIS — I110 Hypertensive heart disease with heart failure: Secondary | ICD-10-CM | POA: Diagnosis present

## 2017-12-09 DIAGNOSIS — C8330 Diffuse large B-cell lymphoma, unspecified site: Secondary | ICD-10-CM | POA: Diagnosis not present

## 2017-12-09 DIAGNOSIS — R05 Cough: Secondary | ICD-10-CM | POA: Diagnosis not present

## 2017-12-09 DIAGNOSIS — B009 Herpesviral infection, unspecified: Secondary | ICD-10-CM | POA: Diagnosis present

## 2017-12-09 DIAGNOSIS — K5900 Constipation, unspecified: Secondary | ICD-10-CM | POA: Diagnosis present

## 2017-12-09 DIAGNOSIS — R059 Cough, unspecified: Secondary | ICD-10-CM

## 2017-12-09 DIAGNOSIS — Z8673 Personal history of transient ischemic attack (TIA), and cerebral infarction without residual deficits: Secondary | ICD-10-CM

## 2017-12-09 DIAGNOSIS — Z9981 Dependence on supplemental oxygen: Secondary | ICD-10-CM

## 2017-12-09 DIAGNOSIS — N179 Acute kidney failure, unspecified: Secondary | ICD-10-CM | POA: Diagnosis present

## 2017-12-09 DIAGNOSIS — Z79899 Other long term (current) drug therapy: Secondary | ICD-10-CM

## 2017-12-09 DIAGNOSIS — D72829 Elevated white blood cell count, unspecified: Secondary | ICD-10-CM | POA: Diagnosis present

## 2017-12-09 DIAGNOSIS — Z87891 Personal history of nicotine dependence: Secondary | ICD-10-CM

## 2017-12-09 DIAGNOSIS — E875 Hyperkalemia: Secondary | ICD-10-CM | POA: Diagnosis present

## 2017-12-09 DIAGNOSIS — R32 Unspecified urinary incontinence: Secondary | ICD-10-CM | POA: Diagnosis present

## 2017-12-09 DIAGNOSIS — C8331 Diffuse large B-cell lymphoma, lymph nodes of head, face, and neck: Secondary | ICD-10-CM | POA: Diagnosis present

## 2017-12-09 DIAGNOSIS — C8591 Non-Hodgkin lymphoma, unspecified, lymph nodes of head, face, and neck: Secondary | ICD-10-CM

## 2017-12-09 DIAGNOSIS — Z820 Family history of epilepsy and other diseases of the nervous system: Secondary | ICD-10-CM

## 2017-12-09 DIAGNOSIS — Z807 Family history of other malignant neoplasms of lymphoid, hematopoietic and related tissues: Secondary | ICD-10-CM

## 2017-12-09 DIAGNOSIS — M109 Gout, unspecified: Secondary | ICD-10-CM | POA: Diagnosis present

## 2017-12-09 DIAGNOSIS — R600 Localized edema: Secondary | ICD-10-CM | POA: Diagnosis not present

## 2017-12-09 DIAGNOSIS — E46 Unspecified protein-calorie malnutrition: Secondary | ICD-10-CM | POA: Diagnosis not present

## 2017-12-09 DIAGNOSIS — E8770 Fluid overload, unspecified: Secondary | ICD-10-CM

## 2017-12-09 DIAGNOSIS — R5381 Other malaise: Principal | ICD-10-CM

## 2017-12-09 DIAGNOSIS — I469 Cardiac arrest, cause unspecified: Secondary | ICD-10-CM | POA: Diagnosis not present

## 2017-12-09 DIAGNOSIS — M5410 Radiculopathy, site unspecified: Secondary | ICD-10-CM

## 2017-12-09 DIAGNOSIS — E8779 Other fluid overload: Secondary | ICD-10-CM | POA: Diagnosis not present

## 2017-12-09 DIAGNOSIS — I361 Nonrheumatic tricuspid (valve) insufficiency: Secondary | ICD-10-CM | POA: Diagnosis not present

## 2017-12-09 DIAGNOSIS — I480 Paroxysmal atrial fibrillation: Secondary | ICD-10-CM | POA: Diagnosis not present

## 2017-12-09 DIAGNOSIS — R531 Weakness: Secondary | ICD-10-CM | POA: Diagnosis not present

## 2017-12-09 DIAGNOSIS — R0602 Shortness of breath: Secondary | ICD-10-CM

## 2017-12-09 DIAGNOSIS — N1330 Unspecified hydronephrosis: Secondary | ICD-10-CM | POA: Diagnosis not present

## 2017-12-09 DIAGNOSIS — J449 Chronic obstructive pulmonary disease, unspecified: Secondary | ICD-10-CM | POA: Diagnosis present

## 2017-12-09 DIAGNOSIS — Z5111 Encounter for antineoplastic chemotherapy: Secondary | ICD-10-CM | POA: Diagnosis not present

## 2017-12-09 DIAGNOSIS — C8580 Other specified types of non-Hodgkin lymphoma, unspecified site: Secondary | ICD-10-CM | POA: Diagnosis not present

## 2017-12-09 DIAGNOSIS — D696 Thrombocytopenia, unspecified: Secondary | ICD-10-CM | POA: Diagnosis present

## 2017-12-09 DIAGNOSIS — R52 Pain, unspecified: Secondary | ICD-10-CM

## 2017-12-09 DIAGNOSIS — C8590 Non-Hodgkin lymphoma, unspecified, unspecified site: Secondary | ICD-10-CM

## 2017-12-09 DIAGNOSIS — G629 Polyneuropathy, unspecified: Secondary | ICD-10-CM | POA: Diagnosis present

## 2017-12-09 DIAGNOSIS — G540 Brachial plexus disorders: Secondary | ICD-10-CM | POA: Diagnosis not present

## 2017-12-09 DIAGNOSIS — Z7901 Long term (current) use of anticoagulants: Secondary | ICD-10-CM

## 2017-12-09 LAB — BASIC METABOLIC PANEL
Anion gap: 8 (ref 5–15)
BUN: 11 mg/dL (ref 6–20)
CHLORIDE: 108 mmol/L (ref 101–111)
CO2: 24 mmol/L (ref 22–32)
Calcium: 10.7 mg/dL — ABNORMAL HIGH (ref 8.9–10.3)
Creatinine, Ser: 0.61 mg/dL (ref 0.44–1.00)
GFR calc non Af Amer: >60 mL/min (ref >60)
Glucose, Bld: 85 mg/dL (ref 65–99)
POTASSIUM: 4.1 mmol/L (ref 3.5–5.1)
SODIUM: 140 mmol/L (ref 135–145)

## 2017-12-09 LAB — CBC
HCT: 31.6 % — ABNORMAL LOW (ref 36.0–46.0)
HEMOGLOBIN: 10.4 g/dL — AB (ref 12.0–15.0)
MCH: 33.8 pg (ref 26.0–34.0)
MCHC: 32.9 g/dL (ref 30.0–36.0)
MCV: 102.6 fL — ABNORMAL HIGH (ref 78.0–100.0)
Platelets: 176 10*3/uL (ref 150–400)
RBC: 3.08 MIL/uL — AB (ref 3.87–5.11)
RDW: 15.1 % (ref 11.5–15.5)
WBC: 5 10*3/uL (ref 4.0–10.5)

## 2017-12-09 LAB — URIC ACID: URIC ACID, SERUM: 2.3 mg/dL (ref 2.3–6.6)

## 2017-12-09 LAB — GLUCOSE, CAPILLARY
GLUCOSE-CAPILLARY: 57 mg/dL — AB (ref 65–99)
GLUCOSE-CAPILLARY: 62 mg/dL — AB (ref 65–99)
GLUCOSE-CAPILLARY: 95 mg/dL (ref 65–99)
GLUCOSE-CAPILLARY: 83 mg/dL (ref 65–99)

## 2017-12-09 LAB — HEPARIN LEVEL (UNFRACTIONATED): HEPARIN UNFRACTIONATED: 0.65 [IU]/mL (ref 0.30–0.70)

## 2017-12-09 LAB — PROTIME-INR
INR: 2.14
PROTHROMBIN TIME: 23.7 s — AB (ref 11.4–15.2)

## 2017-12-09 MED ORDER — GABAPENTIN 300 MG PO CAPS: 300.0000 mg | ORAL_CAPSULE | Freq: Three times a day (TID) | ORAL | Status: AC

## 2017-12-09 MED ORDER — WARFARIN SODIUM 5 MG PO TABS: 5.0000 mg | ORAL_TABLET | Freq: Once | ORAL | Status: DC

## 2017-12-09 MED ORDER — METOPROLOL TARTRATE 25 MG PO TABS: 12.5000 mg | ORAL_TABLET | Freq: Two times a day (BID) | ORAL | Status: AC

## 2017-12-09 MED ORDER — WARFARIN - PHARMACIST DOSING INPATIENT
Freq: Every day | Status: DC
  Administered 2017-12-10 – 2017-12-11 (×2)

## 2017-12-09 MED ORDER — ALLOPURINOL 300 MG PO TABS
300.0000 mg | ORAL_TABLET | Freq: Every day | ORAL | Status: DC
  Administered 2017-12-10 – 2017-12-27 (×17): 300 mg via ORAL
  Filled 2017-12-09 (×18): qty 1

## 2017-12-09 MED ORDER — HEPARIN (PORCINE) IN NACL 100-0.45 UNIT/ML-% IJ SOLN
800.0000 [IU]/h | INTRAMUSCULAR | Status: DC
  Filled 2017-12-09: qty 250

## 2017-12-09 MED ORDER — POLYETHYLENE GLYCOL 3350 17 G PO PACK
17.0000 g | PACK | Freq: Every day | ORAL | Status: DC
  Administered 2017-12-16 – 2017-12-26 (×5): 17 g via ORAL
  Filled 2017-12-09 (×15): qty 1

## 2017-12-09 MED ORDER — OXYCODONE HCL 5 MG PO TABS
10.0000 mg | ORAL_TABLET | ORAL | Status: DC | PRN
  Administered 2017-12-10 – 2017-12-27 (×17): 10 mg via ORAL
  Filled 2017-12-09 (×17): qty 2

## 2017-12-09 MED ORDER — ACETAMINOPHEN 650 MG RE SUPP: 650.0000 mg | Freq: Four times a day (QID) | RECTAL | Status: DC | PRN

## 2017-12-09 MED ORDER — LEVOTHYROXINE SODIUM 88 MCG PO TABS
88.0000 ug | ORAL_TABLET | Freq: Every day | ORAL | Status: DC
  Administered 2017-12-10 – 2017-12-27 (×18): 88 ug via ORAL
  Filled 2017-12-09 (×19): qty 1

## 2017-12-09 MED ORDER — ALLOPURINOL 300 MG PO TABS: 300.0000 mg | ORAL_TABLET | Freq: Every day | ORAL | Status: AC

## 2017-12-09 MED ORDER — FLECAINIDE ACETATE 100 MG PO TABS: 100.0000 mg | ORAL_TABLET | Freq: Two times a day (BID) | ORAL | Status: AC

## 2017-12-09 MED ORDER — FLECAINIDE ACETATE 100 MG PO TABS
100.0000 mg | ORAL_TABLET | Freq: Two times a day (BID) | ORAL | Status: DC
  Administered 2017-12-09 – 2017-12-27 (×36): 100 mg via ORAL
  Filled 2017-12-09 (×40): qty 1

## 2017-12-09 MED ORDER — SENNOSIDES-DOCUSATE SODIUM 8.6-50 MG PO TABS: 1.0000 | ORAL_TABLET | Freq: Every day | ORAL | Status: AC

## 2017-12-09 MED ORDER — DEXTROSE-NACL 5-0.9 % IV SOLN: 75.0000 mL/h | INTRAVENOUS | Status: AC

## 2017-12-09 MED ORDER — OXYCODONE HCL 10 MG PO TABS: 10.0000 mg | ORAL_TABLET | ORAL | 0 refills | Status: AC | PRN

## 2017-12-09 MED ORDER — ONDANSETRON HCL 4 MG PO TABS: 4.0000 mg | ORAL_TABLET | Freq: Four times a day (QID) | ORAL | Status: DC | PRN

## 2017-12-09 MED ORDER — ACETAMINOPHEN 325 MG PO TABS
650.0000 mg | ORAL_TABLET | Freq: Four times a day (QID) | ORAL | Status: DC | PRN
  Administered 2017-12-22 – 2017-12-23 (×2): 650 mg via ORAL
  Filled 2017-12-09 (×3): qty 2

## 2017-12-09 MED ORDER — SORBITOL 70 % SOLN: 30.0000 mL | Freq: Every day | Status: DC | PRN

## 2017-12-09 MED ORDER — ONDANSETRON HCL 4 MG/2ML IJ SOLN: 4.0000 mg | Freq: Four times a day (QID) | INTRAMUSCULAR | Status: DC | PRN

## 2017-12-09 MED ORDER — SENNOSIDES-DOCUSATE SODIUM 8.6-50 MG PO TABS
1.0000 | ORAL_TABLET | Freq: Every day | ORAL | Status: DC
  Administered 2017-12-09 – 2017-12-26 (×14): 1 via ORAL
  Filled 2017-12-09 (×17): qty 1

## 2017-12-09 MED ORDER — GABAPENTIN 300 MG PO CAPS
300.0000 mg | ORAL_CAPSULE | Freq: Three times a day (TID) | ORAL | Status: DC
  Administered 2017-12-09 – 2017-12-27 (×54): 300 mg via ORAL
  Filled 2017-12-09 (×55): qty 1

## 2017-12-09 MED ORDER — LISINOPRIL 20 MG PO TABS
20.0000 mg | ORAL_TABLET | Freq: Every day | ORAL | Status: DC
  Administered 2017-12-10 – 2017-12-15 (×6): 20 mg via ORAL
  Filled 2017-12-09 (×8): qty 1

## 2017-12-09 MED ORDER — IPRATROPIUM-ALBUTEROL 0.5-2.5 (3) MG/3ML IN SOLN
3.0000 mL | RESPIRATORY_TRACT | Status: DC | PRN
  Administered 2017-12-16: 3 mL via RESPIRATORY_TRACT
  Filled 2017-12-09: qty 3

## 2017-12-09 MED ORDER — METOPROLOL TARTRATE 12.5 MG HALF TABLET
12.5000 mg | ORAL_TABLET | Freq: Two times a day (BID) | ORAL | Status: DC
  Administered 2017-12-09 – 2017-12-24 (×25): 12.5 mg via ORAL
  Filled 2017-12-09 (×32): qty 1

## 2017-12-09 MED ORDER — WARFARIN SODIUM 5 MG PO TABS
5.0000 mg | ORAL_TABLET | Freq: Once | ORAL | Status: DC
  Administered 2017-12-09: 5 mg via ORAL
  Filled 2017-12-09: qty 1

## 2017-12-09 NOTE — Progress Notes (Signed)
Rehab admissions - I have approval for acute inpatient rehab admission for today.  I will contact attending MD to see if we can admit to inpatient rehab.  I do have a bed available today.  Call me for questions.  #[phone]

## 2017-12-09 NOTE — PMR Pre-admission (Signed)
PMR Admission Coordinator Pre-Admission Assessment  Patient: Colleen [name] is an 82 y.o., female MRN: [id number] DOB: 1935-04-30 Height: 5' 4" (162.6 cm) Weight: 88 kg (194 lb 0.1 oz)              Insurance Information HMO: Yes    PPO:       PCP:       IPA:       80/20:       OTHER:  Group # [id number] PRIMARY: BCBS medicare      Policy#: [id number]      Subscriber: Macario Golds CM Name: Limmie Patricia      Phone#: [phone]     Fax#: [phone] Pre-Cert#:                                                    Employer: Retired Benefits:  Phone #: [phone]     Name:  On line Eff. Date: 12/23/16     Deduct:  $0      Out of Pocket Max: $5500 (met $777.90)      Life Max: N/A CIR: $300 days 1-6      SNF:  $0 days 1-20; $167.50 days 21-60; $0 days 61-100 Outpatient:  Medical necessity     Co-Pay: $40/visit Home Health: 100%      Co-Pay: none DME: 80%     Co-Pay: 20% Providers: in network  Emergency [place]    Name Relation Home Work Mobile   Deeb,Mike Spouse [phone]  [phone]   Prudy Feeler   [phone]     Current Medical History  Patient Admitting Diagnosis: Diffuse large B-cell non-Hodgkin's lymphoma    History of Present Illness: An 82 y.o.right handed femalewith history of atrial fibrillation with chronic Coumadin, diastolic congestive heart failure, hypertension, TIA and remote tobacco abuse.History taken from chart review and patient.Per report patient lives with spouseand son. Independent prior to admission. Multilevel home bath and bedroom upstairs. Spouse with limited physical assistanceand spouse andson work during the day. Presented 12/08/2018withcomplaints of right arm pain and swelling as well as recent reports of a mass in her right neck. CT of the chest showed significant adenopathy bilaterally. MRI soft tissue of the neck showed bulky cervical and included chest lymphadenopathy with right brachial  plexus impingement. Lymph node biopsy 12/02/2017 showed likely diffuse large B-cell non-Hodgkin's lymphoma. Hematology consulted/Dr. Magrinat placed on prednisone/allopurinol as well as [facility] 12/04/2017. Plan is to receive CHOP with infusional doxorubicin 12/20 per hematology/oncology services Chronic Coumadin has been resumed. Physical and occupational therapy evaluations completed with recommendations of physical medicine rehabilitation consult.  Patient to be admitted for a comprehensive inpatient rehabilitation program.  Past Medical History  Past Medical History:  Diagnosis Date  . Atrial fibrillation ([place])   . Carotid artery disease ([place])   . CHF (congestive heart failure) ([place])   . Hyperlipidemia   . Hypertension   . Hypothyroidism   . Shortness of breath   . TIA (transient ischemic attack) 2001    Family History  family history includes Arthritis in her mother and sister; Colon cancer in her brother; Heart failure in her mother; Parkinson's disease in her mother.  Prior Rehab/Hospitalizations: No previous rehab.  Has the patient had major surgery during 100 days prior to admission? No  Current Medications   Current Facility-Administered Medications:  .  acetaminophen (TYLENOL) tablet 650 mg, 650 mg, Oral, Q6H PRN **OR** acetaminophen (TYLENOL) suppository 650 mg, 650 mg, Rectal, Q6H PRN, Elwin Mocha, MD .  allopurinol (ZYLOPRIM) tablet 300 mg, 300 mg, Oral, Daily, Magrinat, Virgie Dad, MD, 300 mg at 12/09/17 0819 .  dextrose 5 %-0.9 % sodium chloride infusion, , Intravenous, Continuous, Alma Friendly, MD, Last Rate: 75 mL/hr at 12/08/17 2105 .  flecainide (TAMBOCOR) tablet 100 mg, 100 mg, Oral, BID, Georgette Shell, MD, 100 mg at 12/09/17 8921 .  gabapentin (NEURONTIN) capsule 300 mg, 300 mg, Oral, TID, Magrinat, Virgie Dad, MD, 300 mg at 12/09/17 1456 .  heparin ADULT infusion 100 units/mL (25000 units/221m sodium chloride 0.45%), 800  Units/hr, Intravenous, Continuous, [facility]ona Curls [facility] Last Rate: 8 mL/hr at 12/08/17 1532, 800 Units/hr at 12/08/17 1532 .  hydrALAZINE (APRESOLINE) injection 10 mg, 10 mg, Intravenous, Q8H PRN, HElwin Mocha MD .  HYDROmorphone (DILAUDID) injection 0.5-1 mg, 0.5-1 mg, Intravenous, Q3H PRN, PLavina Hamman MD, 1 mg at 12/01/17 0001 .  ipratropium-albuterol (DUONEB) 0.5-2.5 (3) MG/3ML nebulizer solution 3 mL, 3 mL, Nebulization, Q4H PRN, PLavina Hamman MD, 3 mL at 12/08/17 0934 .  levothyroxine (SYNTHROID, LEVOTHROID) tablet 88 mcg, 88 mcg, Oral, QAC breakfast, HElwin Mocha MD, 88 mcg at 12/09/17 [phone].  lisinopril (PRINIVIL,ZESTRIL) tablet 20 mg, 20 mg, Oral, Daily, EAlma Friendly MD, 20 mg at 12/09/17 0819 .  metoprolol tartrate (LOPRESSOR) tablet 12.5 mg, 12.5 mg, Oral, BID, EAlma Friendly MD, 12.5 mg at 12/09/17 0818 .  ondansetron (ZOFRAN) tablet 4 mg, 4 mg, Oral, Q6H PRN **OR** ondansetron (ZOFRAN) injection 4 mg, 4 mg, Intravenous, Q6H PRN, HElwin Mocha MD .  oxyCODONE (Oxy IR/ROXICODONE) immediate release tablet 10 mg, 10 mg, Oral, Q4H PRN, EAlma Friendly MD, 10 mg at 12/09/17 1454 .  polyethylene glycol (MIRALAX / GLYCOLAX) packet 17 g, 17 g, Oral, Daily, PLavina Hamman MD .  senna-docusate (Senokot-S) tablet 1 tablet, 1 tablet, Oral, QHS, PLavina Hamman MD, 1 tablet at 12/05/17 2215 .  sodium chloride flush (NS) 0.9 % injection 10-40 mL, 10-40 mL, Intracatheter, PRN, EAlma Friendly MD, 10 mL at 12/08/17 2315 .  warfarin (COUMADIN) tablet 5 mg, 5 mg, Oral, ONCE-1800, [place]omona Curls [facility].  Warfarin - Pharmacist Dosing Inpatient, , Does not apply, q1800, [facility]outon [facility] Patients Current Diet: Diet regular Room service appropriate? Yes; Fluid consistency: Thin Diet - low sodium heart healthy  Precautions / Restrictions Precautions Precautions: Other (comment)(monitor ) Precaution Comments: watch O2 Restrictions Weight Bearing  Restrictions: No   Has the patient had 2 or more falls or a fall with injury in the past year?No  Prior Activity Level Community (5-7x/wk): Went out 4-5 X a week, was driving.  Home Assistive Devices / Equipment Home Assistive Devices/Equipment: None Home Equipment: WEnvironmental consultant- 2 wheels, Shower seat, Bedside commode, Wheelchair - manual  Prior Device Use: Indicate devices/aids used by the patient prior to current illness, exacerbation or injury? None  Prior Functional Level Prior Function Level of Independence: Independent  Self Care: Did the patient need help bathing, dressing, using the toilet or eating?  Independent  Indoor Mobility: Did the patient need assistance with walking from room to room (with or without device)? Independent  Stairs: Did the patient need assistance with internal or external stairs (with or without device)? Independent  Functional Cognition: Did the patient need help planning regular tasks such as shopping or  remembering to take medications? Independent  Current Functional Level Cognition  Overall Cognitive Status: Within Functional Limits for tasks assessed Orientation Level: Oriented X4 General Comments: Very pleasant and willing to try despite pain and fatigue     Extremity Assessment (includes Sensation/Coordination)  Upper Extremity Assessment: RUE deficits/detail RUE Deficits / Details: Significant edema (non-pitting) throughout which is significantly limiting active and passive movement. Passively able to achieve 0-15 degrees R shoulder forward flexion and abduction. Able to achieve 75% of composite fist and oppose thumb to digits II-IV. Difficulty opposing thumb to fifth digit but able to achieve at end of session.    Lower Extremity Assessment: Generalized weakness    ADLs  Overall ADL's : Needs assistance/impaired Eating/Feeding: Moderate assistance, Sitting Grooming: Minimal assistance, Sitting Upper Body Bathing: Sitting, Maximal  assistance Lower Body Bathing: Sit to/from stand, Total assistance Upper Body Dressing : Sitting, Maximal assistance Lower Body Dressing: Total assistance, Sit to/from stand Toilet Transfer: Moderate assistance, Stand-pivot, BSC Toilet Transfer Details (indicate cue type and reason): Simulated from bed to chair.  Toileting- Clothing Manipulation and Hygiene: Total assistance, Sit to/from stand Functional mobility during ADLs: Moderate assistance(stand-pivot only) General ADL Comments: Pt with generalized B LE weakness impacting mobility. Significant difficulty with bimanual tasks.     Mobility  Overal bed mobility: Needs Assistance Bed Mobility: Sit to Supine Supine to sit: Mod assist Sit to supine: Min assist General bed mobility comments: DNT, received up in chair     Transfers  Overall transfer level: Needs assistance Equipment used: Rolling walker (2 wheeled) Transfers: Sit to/from Stand Sit to Stand: Mod assist Stand pivot transfers: Min assist General transfer comment: cues for hand placement and for technique with sit to stand from low surface; most of push coming from L UE and LEs    Ambulation / Gait / Stairs / Wheelchair Mobility  Ambulation/Gait Ambulation/Gait assistance: Museum/gallery curator (Feet): 10 Feet Assistive device: Rolling walker (2 wheeled) Gait Pattern/deviations: Step-through pattern, Trunk flexed, Narrow base of support, Shuffle General Gait Details: attempted, patient limited by fatigue following standing exercises Gait velocity interpretation: Below normal speed for age/gender    Posture / Balance Balance Overall balance assessment: Needs assistance Sitting-balance support: Bilateral upper extremity supported, Feet supported Sitting balance-Leahy Scale: Good Standing balance support: Bilateral upper extremity supported Standing balance-Leahy Scale: Fair Standing balance comment: reliant on support from L UE on walker     Special  needs/care consideration BiPAP/CPAP No CPM No Continuous Drip IV Heparin drip Dialysis No       Life Vest No Oxygen No Special Bed No Trach Size No Wound Vac (area) No      Skin No                            Bowel mgmt: Last BM 12/09/17 Bladder mgmt: External catheter Diabetic mgmt No    Previous Home Environment Living Arrangements: Spouse/significant other Available Help at Discharge: Family, Available 24 hours/day(husband limited physically) Type of Home: House Home Layout: Multi-level, Bed/bath upstairs Alternate Level Stairs-Rails: Right, Left Alternate Level Stairs-Number of Steps: approx 9 steps with rails Home Access: Stairs to enter Entrance Stairs-Number of Steps: 4 Home Care Services: No  Discharge Living Setting Plans for Discharge Living Setting: Patient's home, House, Lives with (comment)(Lives with husband and her son.) Type of Home at Discharge: House Discharge Home Layout: Multi-level, Laundry or work area in basement, Bed/bath upstairs Alternate Level Stairs-Number of Steps: 5 steps up  to bedrooms and full bathroom; 9 steps down to den, office 1/2 bath and laundry Discharge Home Access: Stairs to enter [employer] of Steps: 3 Does the patient have any problems obtaining your medications?: No  Social/Family/Support Systems Patient Roles: Spouse, Parent(Has a husband, son, daughter, granddaughter) Contact Information: Taci Sterling - spouse - See emergency contacts Anticipated Caregiver: Family Ability/Limitations of Caregiver: Husband works 3 days a week, son works, Clinical research associate and granddaughter work Careers adviser: Intermittent Discharge Plan Discussed with Primary Caregiver: Yes Is Caregiver In Agreement with Plan?: Yes Does Caregiver/Family have Issues with Lodging/Transportation while Pt is in Rehab?: No  Goals/Additional Needs Patient/Family Goal for Rehab: PT/OT supervision to min assist goals Expected length of stay: 8-13 days Cultural  Considerations: Mina Marble, attends church on Wednesday and Sundays Dietary Needs: Regular diet, thin liquids Equipment Needs: TBD Special Service Needs: Per Dr Jana Hakim, the plan is for chemo on 12/20 (CHOP) and 12/21 (rituximab. Pt/Family Agrees to Admission and willing to participate: Yes Program Orientation Provided & Reviewed with Pt/Caregiver Including Roles  & Responsibilities: Yes  Decrease burden of Care through IP rehab admission: N/A  Possible need for SNF placement upon discharge: Not planned  Patient Condition: This patient's condition remains as documented in the consult dated 12/08/17, in which the Rehabilitation Physician determined and documented that the patient's condition is appropriate for intensive rehabilitative care in an inpatient rehabilitation facility pending:  Patient is now medically stable per attending MD.  Patient is tolerating up in chair and is participating with therapies. These areas have been addressed. Will admit to inpatient rehab today.  Preadmission Screen Completed By:  Retta Diones, 12/09/2017 3:39 PM ______________________________________________________________________   Discussed status with Dr. Naaman Plummer on 12/09/17 at 1535  and received telephone approval for admission today.  Admission Coordinator:  Retta Diones, time 1540/Date 12/09/17

## 2017-12-09 NOTE — Progress Notes (Signed)
ANTICOAGULATION CONSULT NOTE - Follow Up Consult  Pharmacy Consult for heparin Indication: atrial fibrillation  Labs: Recent Labs    12/06/17 0500  12/07/17 0540  12/08/17 0247 12/08/17 1312 12/08/17 2306  HGB 10.5*  --  9.5*  --  10.2*  --   --   HCT 31.7*  --  28.1*  --  30.9*  --   --   PLT 181  --  163  --  185  --   --   LABPROT 17.5*  --  19.3*  --  20.8*  --   --   INR 1.44  --  1.64  --  1.81  --   --   HEPARINUNFRC 0.35   < >  --    < > 1.01* 1.07* 0.62  CREATININE 0.69  --  0.57  --  0.61  --   --    < > = values in this interval not displayed.    Assessment/Plan:  82yo female therapeutic on heparin after rate change. Will continue gtt at current rate and confirm stable with am labs.   Wynona Neat, PharmD, BCPS 12/09/2017,12:07 AM

## 2017-12-09 NOTE — Progress Notes (Signed)
Patient received at approximatley 1813 from 4E09 alert and oriented x 4. No complaint of pain. Heparin 8cc/hr infusing with D5NS at 75cc/hr via left upper extremity PICC. Bilateral upper extremities red edematous. Oriented patient to room and call bell system. Patient and family verbalized understanding of admission process. Continue with plan of care.  Mliss Sax

## 2017-12-09 NOTE — Progress Notes (Signed)
ANTICOAGULATION CONSULT NOTE   Pharmacy Consult for Heparin + Warfarin Indication: atrial fibrillation  No Known Allergies  Patient Measurements: Height: 5\' 4"  (162.6 cm) Weight: 194 lb 0.1 oz (88 kg) IBW/kg (Calculated) : 54.7  Heparin dosing weight = 70.5 kg  Vital Signs: Temp: 97.9 F (36.6 C) (12/18 0400) Temp Source: Oral (12/18 0400) BP: 175/103 (12/18 0815) Pulse Rate: 59 (12/18 0400)  Labs: Recent Labs    12/07/17 0540  12/08/17 0247 12/08/17 1312 12/08/17 2306 12/09/17 0403  HGB 9.5*  --  10.2*  --   --  10.4*  HCT 28.1*  --  30.9*  --   --  31.6*  PLT 163  --  185  --   --  176  LABPROT 19.3*  --  20.8*  --   --  23.7*  INR 1.64  --  1.81  --   --  2.14  HEPARINUNFRC  --    < > 1.01* 1.07* 0.62 0.65  CREATININE 0.57  --  0.61  --   --  0.61   < > = values in this interval not displayed.   Estimated Creatinine Clearance: 60.2 mL/min (by C-G formula based on SCr of 0.61 mg/dL).  Assessment: 82 y/o female admitted with lymphadenopathy requiring biopsy. She is on chronic warfarin for history of afib but this was held for biopsy. Heparin initiated and warfarin was restarted 12/13. Heparin level remains therapeutic. INR with slow rise up to 2.14 - dose missed 12/15. CBC stable. No bleed documented.  Home coumadin dose: 2.5mg  daily except 5mg  MF  Goal of Therapy:  INR 2-3 Heparin level 0.3-0.7 units/ml Monitor platelets by anticoagulation protocol: Yes   Plan:  -Continue heparin at 800 units/hr - d/c when INR therapeutic >24hr -Coumadin 5mg  PO x 1 - then likely back to home dose -Daily heparin level/CBC/INR -Monitor for s/sx bleeding  Elicia Lamp, PharmD, BCPS Clinical Pharmacist Clinical phone for 12/09/2017 until 3:30pm: [phone] If after 3:30pm, please call main pharmacy at: [phone] 12/09/2017 8:57 AM

## 2017-12-09 NOTE — Discharge Summary (Addendum)
Physician Discharge Summary  Colleen [name] HDQ:[id number] DOB: [birth year] DOA: 11/29/2017  PCP: Ann Held, DO  Admit date: 11/29/2017 Discharge date: 12/09/2017  Time spent: 35 minutes  Recommendations for Outpatient Follow-up:  1. See MAR for some med changes including addition of Allopruinol, increase in dose of Flecainde-transition back to Coumadin and need for continued IV d5--would hold lasix on-going 2. Please get inr and cbc 1 week 3. Please get cmet 1 week 4. Initiation CHOP to be done at Rehab if possible on 12/20-Echo to be done when possible prior to initiation of Chemo---please consult Dr. Jana Hakim regarding planning for the same if not able to do so at [facility]  Discharge Diagnoses:  Active Problems:   Hypoxia   Lymphadenopathy   Acute cystitis without hematuria   Lactic acidosis   Malignant lymphomas of lymph nodes of head, face, and neck (HCC)   Right arm pain   Brachial plexopathy   Diffuse large B-cell lymphoma of lymph nodes of neck (HCC)   PAF (paroxysmal atrial fibrillation) (HCC)   Chronic diastolic congestive heart failure (HCC)   History of TIA (transient ischemic attack)   Benign essential HTN   Acute blood loss anemia   Discharge Condition: stable  Diet recommendation:  hh low salt  Filed Weights   12/06/17 0503 12/07/17 0516 12/08/17 0440  Weight: 86.5 kg (190 lb 11.2 oz) 88 kg (194 lb 0.1 oz) 88 kg (194 lb 0.1 oz)    History of present illness:  82 fm with Afib CHad>3 on flecainide and Coumadin Htn Diastolic CHF Hypothyroid Prior CVA admit with Rapidly prog R Arm pain and =Brachial neuritis + Lymphad LN biopsy=Large B-cell NHL-Oncology consulted-Started Predsnisone and Allopurinol and Rituxan  Hospital Course:  Diffuse large B-cell non Hodgkin's Lymphoma New onset, LN biopsy on 12/02/17 + R arm pain, swelling, diffuse lymphadenopathy on presentation CT scan chest 11/24/2017 shows lymphadenopathy Hem/onc on board, Dr Jana Hakim,  pathology from 12/02/17 which showed diffuse large B cell NHL S/P Pred X 5 days, completed on 12/08/17, oncology to determine if pt still needs steroids Continue allopurinol Rituximab weekly, 1st dose on 12/04/17, next on 12/11/17 Arm sling, as needed narcotics for pain management for now Monitor closely for tumor lysis syndrome  Follow up closely with Hem/onc outpt, plan is to start bendamustine or CHOP with infusional Adriamycin 12/20 Continue IVF d5 at 75 cc/h for now holding lasix  #Concern for airway compromise with hypoxia. Stable, no resp distress Initially presented with hypoxia with midline shift of the trachea due to bulky lymphadenopathy CCM was consulted, recommended stepdown admission  Stabilized for tele level care  #Right arm Lymphedema Right hand swelling, inability to move R hand MRI neck shows brachial plexus impingement likely brachial neuritis from lymphadenopathy OT on board, rec CIR, consult placed  #Chronic A. Fib. Controlled On Coumadin, heparin bridging Continue flecainide, started low dose lopressor due to persistent tachycardia and now slight bradycardia on d/c Resume Coumadin as INr therapeutic and no current procedures planned at present  #Mild hypercalcemia Persistent Oncology on board, give calcitonin nasal spray X 2 doses, consider IV bisphosphonate if persistent Check calcium at CIR  #UTI U/A showed mod leuko, pos nitrite, many bacteria UC never sent out S/P IV Rocephin for 6 days total and would have completed empiric Tx  #Lactic acidosis 7.91->4.9 Likely due to aggressive lymphoma rather than hypoperfusion No signs of sepsis Continue IVF  #Essential hypertension BP controlled Monitor  #Hypothyroidism Continue Synthroid  Consultations:  Oncology  CCM   CIR  Discharge Exam: Vitals:   12/09/17 0400 12/09/17 0815  BP: (!) 159/68 (!) 175/103  Pulse: (!) 59   Resp: 13 16  Temp: 97.9 F (36.6 C)   SpO2: 92%      General: alert pleasant and in nad Cardiovascular: s1 s 2no m/r/g Respiratory: clear Still only trace power RUE, however hand grip is relatively strong  Discharge Instructions    Allergies as of 12/09/2017   No Known Allergies     Medication List    STOP taking these medications   furosemide 40 MG tablet Commonly known as:  LASIX     TAKE these medications   acetaminophen 500 MG tablet Commonly known as:  TYLENOL Take 1,000 mg by mouth every 6 (six) hours as needed for headache (pain).   allopurinol 300 MG tablet Commonly known as:  ZYLOPRIM Take 1 tablet (300 mg total) by mouth daily. Start taking on:  12/10/2017   dextrose 5 % and 0.9% NaCl 5-0.9 % infusion Inject 75 mL/hr into the vein continuous.   flecainide 100 MG tablet Commonly known as:  TAMBOCOR Take 1 tablet (100 mg total) by mouth 2 (two) times daily. What changed:  how much to take   gabapentin 300 MG capsule Commonly known as:  NEURONTIN Take 1 capsule (300 mg total) by mouth 3 (three) times daily.   levothyroxine 88 MCG tablet Commonly known as:  SYNTHROID, LEVOTHROID Take 88 mcg by mouth daily before breakfast.   lisinopril 20 MG tablet Commonly known as:  PRINIVIL,ZESTRIL Take 1 tablet (20 mg total) by mouth 2 (two) times daily. What changed:  when to take this   metoprolol tartrate 25 MG tablet Commonly known as:  LOPRESSOR Take 0.5 tablets (12.5 mg total) by mouth 2 (two) times daily.   Oxycodone HCl 10 MG Tabs Take 1 tablet (10 mg total) by mouth every 4 (four) hours as needed for moderate pain.   senna-docusate 8.6-50 MG tablet Commonly known as:  Senokot-S Take 1 tablet by mouth at bedtime.   warfarin 5 MG tablet Commonly known as:  COUMADIN Take as directed. If you are unsure how to take this medication, talk to your nurse or doctor. Original instructions:  Take 2.5-5 mg by mouth See admin instructions. Take 1 tablet (5 mg) by mouth on Monday and Friday after supper, take  1/2 tablet (2.5 mg) on Sunday, Tuesday, Wednesday, Thursday, Saturday with supper      No Known Allergies    The results of significant diagnostics from this hospitalization (including imaging, microbiology, ancillary and laboratory) are listed below for reference.    Significant Diagnostic Studies: Ct Soft Tissue Neck Wo Contrast  Result Date: 11/29/2017 CLINICAL DATA:  82 y/o F; patient undergoing evaluation for lymphadenopathy with biopsy in the left subclavian area straightening, now presenting with swelling of the left arm and chest with associated severe pain. EXAM: CT NECK WITHOUT CONTRAST TECHNIQUE: Multidetector CT imaging of the neck was performed following the standard protocol without intravenous contrast. COMPARISON:  11/24/2017 CT of the neck. FINDINGS: Pharynx and larynx: Normal. No mass or swelling. Salivary glands: No inflammation, mass, or stone. Thyroid: Normal. Lymph nodes: Interval progression of diffuse lymphadenopathy, for example a left level 5B lymph node measures 12 x 12 mm, previously 6 mm (series 3, image 88). Increased lymphadenopathy is best appreciated left posterior cervical chain and left upper axilla but increase in adenopathy is present diffusely, for example a right upper peritracheal node measuring 24 x  31 mm, previously 17 x 23 mm (series 3, image 121). Fat stranding surrounding conglomerate lymph nodes throughout the right cervical chain, right greater than left supraclavicular regions, and left axilla may represent infiltrative neoplasm or lymphedema. Vascular: Calcific atherosclerosis of the aorta and carotid siphons. Limited intracranial: Negative. Visualized orbits: Negative. Mastoids and visualized paranasal sinuses: Small left maxillary sinus mucous retention cyst. Otherwise negative. Skeleton: Stable mild cervical spondylosis. No high-grade bony canal stenosis. Upper chest: Emphysema and scarring in bilateral upper lobes is stable. Other: None. IMPRESSION:  Interval progression of lymphadenopathy from 11/24/2017 best appreciated in the left cervical level 5B station, but measurable increase is present diffusely. Findings indicate rapidly progressive lymphoproliferative or metastatic disease. Correlation with biopsy is recommended. Electronically Signed   By: Kristine Garbe M.D.   On: 11/29/2017 22:54   Ct Soft Tissue Neck W Contrast  Result Date: 11/24/2017 CLINICAL DATA:  RIGHT neck and ear pain, enlarging RIGHT supraclavicular and retroauricular mass for 2 weeks. LEFT facial swelling. History of carotid artery surgery. EXAM: CT NECK WITH CONTRAST TECHNIQUE: Multidetector CT imaging of the neck was performed using the standard protocol following the bolus administration of intravenous contrast. CONTRAST:  64mL ISOVUE-300 IOPAMIDOL (ISOVUE-300) INJECTION 61% COMPARISON:  CT chest September 11, 2015 FINDINGS: PHARYNX AND LARYNX: Normal.  Widely patent airway. SALIVARY GLANDS: Normal. THYROID: Normal. LYMPH NODES: Multilevel severe RIGHT cervical lymphadenopathy. 27 mm RIGHT level 1 B lymph node, round RIGHT level IIa 23 mm lymph node, 3.1 cm RIGHT supraclavicular lymph node with pericapsular fat stranding RIGHT neck lymphadenopathy. Mild LEFT neck lymphadenopathy including 11 mm intraparotid lymph nodes and 14 mm LEFT level IIa lymph node. VASCULAR: Normal. LIMITED INTRACRANIAL: Normal. VISUALIZED ORBITS: Surgical clips LEFT neck most compatible with endarterectomy. Severe calcific atherosclerosis RIGHT carotid bifurcation. Lymphadenopathy narrows RIGHT internal jugular vein which remains patent. MASTOIDS AND VISUALIZED PARANASAL SINUSES: Well-aerated. SKELETON: Nonacute. No destructive bony lesions. Patient is edentulous. UPPER CHEST: RIGHT apical bullous changes and scarring. Partially imaged bilateral hilar lymphadenopathy. Mediastinal lymphadenopathy including 2 cm aortopulmonary window lymph node, 17 mm pretracheal lymph node. LEFT greater than RIGHT  axillary lymphadenopathy, at least 4 cm nodal conglomeration with LEFT retropectoral infiltrative mass. Heart size is probably enlarged. Severe coronary artery calcifications. OTHER: IMPRESSION: 1. Severe RIGHT and mild LEFT neck lymphadenopathy. Severe LEFT axillary lymphadenopathy with lymph edema versus lymphangitic spread of tumor. LEFT retropectoral infiltrative mass. Mediastinal lymphadenopathy. Constellation of findings seen with lymphoproliferative disease, metastatic disease such as breast cancer given LEFT chest findings. Recommend CT chest with contrast versus PET-CT. 2. These results will be called to the ordering clinician or representative by the Radiologist Assistant, and communication documented in the PACS or zVision Dashboard. Aortic Atherosclerosis (ICD10-I70.0). Electronically Signed   By: Elon Alas M.D.   On: 11/24/2017 20:19   Ct Chest W Contrast  Result Date: 11/29/2017 CLINICAL DATA:  Left-sided chest pain and swelling, recent biopsy on right supraclavicular lymph nodes EXAM: CT CHEST WITH CONTRAST TECHNIQUE: Multidetector CT imaging of the chest was performed during intravenous contrast administration. CONTRAST:  17mL ISOVUE-300 IOPAMIDOL (ISOVUE-300) INJECTION 61% COMPARISON:  11/24/2017 FINDINGS: Cardiovascular: Atherosclerotic changes of the thoracic aorta are noted without aneurysmal dilatation or dissection. Coronary calcifications are seen. No pulmonary emboli are noted. Some attenuation of the pulmonary arterial branches is noted secondary to hilar adenopathy. Additionally some displacement of the vasculature in the neck is noted particularly on the right also related to adenopathy. Mediastinum/Nodes: Thoracic inlet demonstrates evidence of significant right supraclavicular lymphadenopathy. The largest nodal  mass measures approximately 10 by 4.9 cm and causes displacement of the adjacent vascular structures in the right neck. Additional adenopathy is noted in the anterior  neck similar to that seen on prior examination. Significant mediastinal and hilar adenopathy is identified. The largest of these in the right hilum measures 2.3 cm in short axis. Subcarinal lymphadenopathy is noted measuring 16 mm in short axis. Prevascular adenopathy is noted measuring 2.2 cm in short axis. The esophagus is within normal limits. Considerable adenopathy is noted in the posterior mediastinum surrounding the descending aorta. The nodal mass measures approximately 2.9 by 3.9 cm. Considerable right axillary adenopathy is noted extending along the lateral chest wall. Lungs/Pleura: Emphysematous changes are noted in the lungs bilaterally. Mild scarring is seen. No focal parenchymal mass is noted. No sizable effusion is seen. Upper Abdomen: Gallbladder has been surgically removed. The liver is within normal limits. The adrenal glands and visualized spleen are unremarkable. Musculoskeletal: T9 compression deformity is noted. Some lucency is noted within. It would be difficult to exclude some metastatic involvement given the findings throughout the chest. Degenerative changes of the thoracic spine are seen. No rib abnormality is noted. Considerable soft tissue mass is noted in the left anterior chest wall involving the pectoral muscles and subpectoral region. The overall size of the mass measures approximately 10.5 by 6.7 cm and Ing gallstone the subclavian and axillary artery is and veins. Significant skin thickening and edema is noted within the left breast. It would be difficult to exclude a portion of this representing a breast mass. There is apparent involvement of the chest wall although no bony destruction is seen. IMPRESSION: Diffuse lymphadenopathy involving the supraclavicular regions, mediastinum, bilateral hila as well as the axillary regions bilaterally as described above. Correlation with the recent biopsy results is recommended. It would be difficult to exclude a portion of the abnormality in  the left chest wall to be related to the overlying breast given the degree of skin thickening and edema identified inferiorly. Alternatively this may represent an aggressive lymphoma. Further workup with PET-CT is recommended. T9 compression deformity with some lucencies noted within. This may be strictly related to the compression deformity although the possibility of metastatic disease would deserve consideration as well. No other definitive bony abnormality is noted. Aortic Atherosclerosis (ICD10-I70.0) and Emphysema (ICD10-J43.9). Electronically Signed   By: Inez Catalina M.D.   On: 11/29/2017 20:01   Mr Neck Soft Tissue Only W Wo Contrast  Result Date: 11/30/2017 CLINICAL DATA:  Rapidly progressive lymphadenopathy, RIGHT arm swelling since Thanksgiving. Suspected lymphoma. Status post biopsy, results pending. EXAM: MRI OF THE NECK WITH CONTRAST TECHNIQUE: Multiplanar, multisequence MR imaging was performed following the administration of intravenous contrast. CONTRAST:  45mL MULTIHANCE GADOBENATE DIMEGLUMINE 529 MG/ML IV SOLN COMPARISON:  CT neck November 29, 2017 and CT neck November 24, 2017 FINDINGS: Moderately motion degraded examination. PHARYNX AND LARYNX: Normal.  Widely patent airway. SALIVARY GLANDS: Intraparotid lymph nodes bilaterally measuring to 17 mm in deep lobe. No definite primary parotid mass though limited by motion. THYROID: Normal. LYMPH NODES: Severe RIGHT greater than LEFT cervical lymphadenopathy with intermediate to bright T2 signal and, mild enhancement. Lymphadenopathy all levels of the neck, at least 8.4 x 4.2 cm RIGHT supraclavicular nodal conglomeration. Lymphadenopathy abuts the scalene muscles impinges upon the RIGHT brachials plexus. Limited assessment of the brachium plexus due to motion. Massive LEFT retropectoral lymphadenopathy, lesser extent on the RIGHT. VASCULAR: Normal flow-voids. LIMITED INTRACRANIAL: Normal. VISUALIZED ORBITS: Normal. MASTOIDS AND VISUALIZED PARANASAL  SINUSES:  Well-aerated. SKELETON: No abnormal bone marrow signal though not tailored for evaluation. Limited assessment of spinal cord due to motion, no suspicious intracanalicular enhancement. UPPER CHEST: Lung apices are clear. No superior mediastinal lymphadenopathy. OTHER: Susceptibility artifact LEFT neck corresponding to use surgical clips. IMPRESSION: 1. Moderately motion degraded examination. 2. Bulky cervical and included chest lymphadenopathy with RIGHT brachial plexus impingement. Electronically Signed   By: Elon Alas M.D.   On: 11/30/2017 22:16   Dg Chest Portable 1 View  Result Date: 11/29/2017 CLINICAL DATA:  Abnormal lymphadenopathy in the neck, chest swelling, initial encounter EXAM: PORTABLE CHEST 1 VIEW COMPARISON:  11/24/2017 FINDINGS: Cardiac shadow is within normal limits. Soft tissue mass lesion is again noted superimposed over the left hilum similar to that seen on recent CT of the neck. Fullness in the hilar regions is noted bilaterally likely representing some adenopathy. Aortic calcifications are seen. The lungs are clear. No bony abnormality is noted. IMPRESSION: Changes consistent with the known left upper lobe mass lesion as well as apparent hilar adenopathy particularly on the right. CT of the chest is recommended for further evaluation as previously described. Electronically Signed   By: Inez Catalina M.D.   On: 11/29/2017 19:10   [place] Core Biopsy (lymph Nodes)  Result Date: 12/02/2017 INDICATION: 82 year old female with presumed lymphoma. She presents for biopsy of nodal mass of the left axilla. EXAM: ULTRASOUND-GUIDED LYMPH NODE BIOPSY MEDICATIONS: None. ANESTHESIA/SEDATION: Moderate (conscious) sedation was employed during this procedure. A total of Versed 2.0 mg and Fentanyl 50 mcg was administered intravenously. Moderate Sedation Time: 10 minutes. The patient's level of consciousness and vital signs were monitored continuously by radiology nursing throughout the  procedure under my direct supervision. FLUOROSCOPY TIME:  None COMPLICATIONS: None PROCEDURE: Informed written consent was obtained from the patient after a thorough discussion of the procedural risks, benefits and alternatives. All questions were addressed. Maximal Sterile Barrier Technique was utilized including caps, mask, sterile gowns, sterile gloves, sterile drape, hand hygiene and skin antiseptic. A timeout was performed prior to the initiation of the procedure. Patient positioned supine position on the ultrasound stretcher. Ultrasound images of the left axillary region were performed with images stored and sent to PACs. The patient is prepped and draped in the usual sterile fashion. The skin and subcutaneous tissues were generously infiltrated 1% lidocaine for local anesthesia. Using ultrasound guidance, at least 8 separate 18 gauge core biopsy of the left axillary node were performed. Specimen placed in the saline. Final image was stored. Patient tolerated the procedure well and remained hemodynamically stable throughout. No complications were encountered and no significant blood loss. IMPRESSION: Status post ultrasound-guided biopsy of left axillary mass. Tissue specimen sent to pathology for complete histopathologic analysis. Signed, Dulcy Fanny. Earleen Newport, DO [facility] Electronically Signed   By: Corrie Mckusick D.O.   On: 12/02/2017 15:51   [place] Core Biopsy (lymph Nodes)  Result Date: 11/28/2017 INDICATION: 82 year old female with a clinical history of rapidly progressive right submental, cervical and supraclavicular lymphadenopathy. She presents today for urgent ultrasound-guided core biopsy. EXAM: ULTRASOUND GUIDED core needle BIOPSY OF right supraclavicular lymph node MEDICATIONS: None. ANESTHESIA/SEDATION: Fentanyl 100 mcg IV; Versed 2 mg IV Moderate Sedation Time:  15 minutes The patient was continuously monitored during the procedure by the  interventional radiology nurse under my direct supervision. PROCEDURE: The procedure, risks, benefits, and alternatives were explained to the patient. Questions regarding the procedure were encouraged and answered. The patient understands and consents to the procedure.  The right submental region, the right cervical chain in the right supraclavicular lymph nodes or all interrogated with ultrasound. There are numerous enlarged, hypoechoic an abnormal appearing lymph nodes in each station. The most solid appearing lymph nodes are present in the right supraclavicular station. The right submental lymph node is slightly more cystic and may not yield viable cells. Therefore, the decision was made to proceed with biopsy of the right supraclavicular station lymph nodes. The right supraclavicular region was prepped with chlorhexidine in a sterile fashion, and a sterile drape was applied covering the operative field. A sterile gown and sterile gloves were used for the procedure. Local anesthesia was provided with 1% Lidocaine. A small dermatotomy was made. Under real-time sonographic guidance, numerous 18 gauge core biopsies were obtained of several lymph nodes in the right supraclavicular station. Biopsy specimens were placed in saline and delivered to pathology for further analysis. Post biopsy imaging demonstrates no evidence of hematoma or active bleeding. The patient tolerated the procedure well. There was no significant bleeding. COMPLICATIONS: None immediate. FINDINGS: Extensive hypoechoic lymphadenopathy throughout the right neck and right submental region. IMPRESSION: Technically successful ultrasound-guided core biopsy of right supraclavicular lymph nodes. Electronically Signed   By: Jacqulynn Cadet M.D.   On: 11/28/2017 09:04    Microbiology: Recent Results (from the past 240 hour(s))  Culture, blood (routine x 2)     Status: None   Collection Time: 11/30/17 12:15 AM  Result Value Ref Range Status    Specimen Description BLOOD LEFT HAND  Final   Special Requests   Final    BOTTLES DRAWN AEROBIC ONLY Blood Culture adequate volume   Culture NO GROWTH 5 DAYS  Final   Report Status 12/05/2017 FINAL  Final  Culture, blood (routine x 2)     Status: None   Collection Time: 11/30/17 12:25 AM  Result Value Ref Range Status   Specimen Description BLOOD LEFT WRIST  Final   Special Requests   Final    BOTTLES DRAWN AEROBIC ONLY Blood Culture adequate volume   Culture NO GROWTH 5 DAYS  Final   Report Status 12/05/2017 FINAL  Final     Labs: Basic Metabolic Panel: Recent Labs  Lab 12/05/17 0248 12/06/17 0500 12/07/17 0540 12/08/17 0247 12/09/17 0403  NA 140 140 139 140 140  K 4.0 3.6 3.2* 4.1 4.1  CL 107 104 110 108 108  CO2 24 26 23 24 24   GLUCOSE 90 70 422* 91 85  BUN 15 14 14 10 11   CREATININE 0.80 0.69 0.57 0.61 0.61  CALCIUM 10.6* 10.9*  10.9* 9.2 10.4* 10.7*  MG  --  1.5*  --   --   --   PHOS  --  2.5  --   --   --    Liver Function Tests: Recent Labs  Lab 12/04/17 0358 12/05/17 0248 12/06/17 0500 12/07/17 0540 12/08/17 0247  AST 36 44* 42* 35 64*  ALT 22 25 27 26  53  ALKPHOS 59 62 66 50 68  BILITOT 0.5 0.4 0.3 0.2* 0.5  PROT 4.9* 4.6* 4.9* 4.2* 4.7*  ALBUMIN 2.4* 2.4* 2.4* 2.2* 2.4*   No results for input(s): LIPASE, AMYLASE in the last 168 hours. No results for input(s): AMMONIA in the last 168 hours. CBC: Recent Labs  Lab 12/05/17 0248 12/06/17 0500 12/07/17 0540 12/08/17 0247 12/09/17 0403  WBC 4.3 4.0 4.2 5.1 5.0  NEUTROABS  --  3.2  --   --   --   HGB 10.0*  10.5* 9.5* 10.2* 10.4*  HCT 29.8* 31.7* 28.1* 30.9* 31.6*  MCV 102.1* 102.6* 102.6* 102.0* 102.6*  PLT 175 181 163 185 176   Cardiac Enzymes: No results for input(s): CKTOTAL, CKMB, CKMBINDEX, TROPONINI in the last 168 hours. BNP: BNP (last 3 results) No results for input(s): BNP in the last 8760 hours.  ProBNP (last 3 results) No results for input(s): PROBNP in the last 8760  hours.  CBG: Recent Labs  Lab 12/08/17 1624 12/08/17 2033 12/09/17 0637 12/09/17 0700 12/09/17 1202  GLUCAP 135* 124* 57* 62* 95       Signed:  Nita Sells MD   [facility] 12/09/2017, 3:03 PM

## 2017-12-09 NOTE — IPOC Note (Signed)
Overall Plan of Care [facility]) Patient Details Name: Colleen [name] MRN: [id number] DOB: 1935/06/24  Admitting Diagnosis: [facility] Problems: Active Problems:   Debility   Lymphedema   Hypoalbuminemia due to protein-calorie malnutrition [facility])     Functional Problem List: Nursing Bladder, Bowel, Edema, Endurance, Medication Management, Motor, Pain, Perception, Safety, Sensory, Skin Integrity  PT Balance, Edema, Endurance, Pain, Safety  OT Balance, Edema, Endurance, Pain, Motor  SLP    TR         Basic ADL's: OT Grooming, Bathing, Dressing, Toileting     Advanced  ADL's: OT Simple Meal Preparation     Transfers: PT Bed Mobility, Bed to Chair, Car, Furniture  OT Tub/Shower, Agricultural engineer: PT Ambulation, Emergency planning/management officer, Stairs     Additional Impairments: OT Fuctional Use of Upper Extremity  SLP        TR      Anticipated Outcomes Item Anticipated Outcome  Self Feeding modified independent  Swallowing      Basic self-care  supervision to min assist  Toileting  min assist   Bathroom Transfers min assist  Bowel/Bladder  managed mod assisy  Transfers  Min A  Locomotion  Min A  Communication     Cognition     Pain  less than 4  Safety/Judgment  modI   Therapy Plan: PT Intensity: Minimum of 1-2 x/day ,45 to 90 minutes PT Frequency: 5 out of 7 days PT Duration Estimated Length of Stay: (10-12) OT Intensity: Minimum of 1-2 x/day, 45 to 90 minutes OT Frequency: 5 out of 7 days OT Duration/Estimated Length of Stay: 14-16 days      Team Interventions: Nursing Interventions Patient/Family Education, Bladder Management, Bowel Management, Disease Management/Prevention, Pain Management, Medication Management, Skin Care/Wound Management, Discharge Planning, Psychosocial Support  PT interventions Ambulation/gait training, Balance/vestibular training, Discharge planning, Disease management/prevention, DME/adaptive equipment instruction,  Functional mobility training, Pain management, Therapeutic Activities, Therapeutic Exercise, Stair training, Patient/family education, Psychosocial support, Wheelchair propulsion/positioning  OT Interventions Training and development officer, Community reintegration, Discharge planning, Pain management, Self Care/advanced ADL retraining, Therapeutic Activities, UE/LE Coordination activities, Therapeutic Exercise, Patient/family education, Functional mobility training, DME/adaptive equipment instruction, UE/LE Strength taining/ROM  SLP Interventions    TR Interventions    SW/CM Interventions Discharge Planning, Psychosocial Support, Patient/Family Education   Barriers to Discharge MD  Medical stability and Pending chemo/radiation  Nursing Incontinence, Pending chemo/radiation    PT Inaccessible home environment, Decreased caregiver support, Home environment access/layout, Incontinence, Lack of/limited family support, Pending chemo/radiation 3 + 5 STE, husband and son both work  OT Decreased caregiver support son and spouse both work,  pt will need 24 hour assist  SLP      SW       Team Discharge Planning: Destination: PT-Skilled Passenger transport manager (SNF)(Home vs SNF due to dependence in mobility, house inaccessibility and husband/son work) ,OT- Home , SLP-  Projected Follow-up: PT-24 hour supervision/assistance, OT-  24 hour supervision/assistance, SLP-  Projected Equipment Needs: PT-To be determined, OT- 3 in 1 bedside comode, Tub/shower bench, SLP-  Equipment Details: PT- , OT-  Patient/family involved in discharge planning: PT- Patient,  OT-Patient, SLP-   MD ELOS: 12-16 days. Medical Rehab Prognosis:  Good Assessment: 82 y.o.right handed femalewith history of atrial fibrillation with chronic Coumadin, diastolic congestive heart failure, hypertension, TIA and remote tobacco abuse.Presented 12/08/2018withcomplaints of right arm pain and swelling as well as recent reports of a mass in her  right neck. CT of the chest showed significant adenopathy  bilaterally. MRI soft tissue of the neck showed bulky cervical and included chest lymphadenopathy with right brachial plexus impingement. Lymph node biopsy 12/02/2017 showed likely diffuse large B-cell non-Hodgkin's lymphoma. Hematology consulted/Dr. Magrinat placed on prednisone/allopurinol as well as [facility] 12/04/2017. Plan is to receive [facility] with infusional doxorubicin 12/20 per hematology/oncology services Chronic Coumadin has been resumed.  Patient with resulting functional deficits with mobility, self-care.  Will set goals for Min A with PT/OT.  See Team Conference Notes for weekly updates to the plan of care

## 2017-12-09 NOTE — H&P (Deleted)
  The note originally documented on this encounter has been moved the the encounter in which it belongs.  

## 2017-12-09 NOTE — Progress Notes (Signed)
START ON PATHWAY REGIMEN - Lymphoma and CLL     A cycle is every 21 days:     Rituximab      Cyclophosphamide      Doxorubicin      Vincristine      Prednisone   **Always confirm dose/schedule in your pharmacy ordering system**    Patient Characteristics: Diffuse Large B-Cell Lymphoma, First Line, Stage III and IV Disease Type: Not Applicable Disease Type: Diffuse Large B-Cell Lymphoma Disease Type: Not Applicable Line of therapy: First Line [place] Stage: III Intent of Therapy: Curative Intent, Discussed with Patient 

## 2017-12-09 NOTE — Progress Notes (Signed)
Hypoglycemic Event  CBG: 56  Treatment: 15 GM carbohydrate snack  Symptoms: None  Follow-up CBG: Time:0700 CBG Result:62  Possible Reasons for Event: Unknown  Comments/MD notified: Pt given another snack. CBG to be rechecked. Patient stated it takes her a while for her blood sugar to come back up.     Colleen [name]

## 2017-12-09 NOTE — Care Management Note (Signed)
Case Management Note Marvetta Gibbons RN, BSN Unit 4E-Case Manager [phone]  Patient Details  Name: Colleen [name] MRN: [id number] Date of Birth: 02-22-1935  Subjective/Objective:   Pt admitted with  Diffuse lymphadenopathy. Right arm pain.  Suspected brachial neuritis. Aggressive lymphoma?? Oncology following                 Action/Plan: PTA pt lived at home with spouse- was independent prior to illness- CM to follow for transition needs.   Expected Discharge Date:  12/09/17               Expected Discharge Plan:  [place]  In-House Referral:  Clinical Social Work  Discharge planning Services  CM Consult  Post Acute Care Choice:  NA Choice offered to:  NA  DME Arranged:    DME Agency:     HH Arranged:    [place] Agency:     Status of Service:  Completed, signed off  If discussed at [employer] of Stay Meetings, dates discussed:  12/18  Discharge Disposition: IP rehab ([facility])   Additional Comments:  12/09/17- 1500- Marvetta Gibbons RN, CM- per Genie with [facility] - they have insurance approval and bed to offer today- have spoken MD- Dr. Verlon Au who is agreeable to d/c today to [facility]- (Dr. Jana Hakim also aware) plan to d/c pt later today to [facility]  12/08/17- 1600- Armen Waring RN, CM- Pt post first chemo tx- second tx planned for 12/20 and 12/21- [facility] has been consulted for possible admission- insurance auth pending- CSW has seen pt for SNF backup. CM will f/u in am regarding possible [facility] admission.   Dawayne Patricia, RN 12/09/2017, 3:20 PM

## 2017-12-09 NOTE — Progress Notes (Signed)
Colleen [name]   DOB:[birth year]   PN#:[id number]   MGQ#:[id number]  Subjective: .Had a hard time yesterday but better today; pain is pretty much gone but can't use right arm; tells me plan is to transfer to rehab and give chemo this admission on 12/20; nurse in room   Objective: Elderly white woman .sitting on bedside commode Vitals:   12/09/17 0000 12/09/17 0400  BP: (!) 117/57 (!) 159/68  Pulse: (!) 59 (!) 59  Resp: 10 13  Temp:  97.9 F (36.6 C)  SpO2: 92% 92%    Body mass index is 33.3 kg/m.  Intake/Output Summary (Last 24 hours) at 12/09/2017 0759 Last data filed at 12/09/2017 0517 Gross per 24 hour  Intake 2659.87 ml  Output 1650 ml  Net 1009.87 ml    CBG (last 3)  Recent Labs    12/08/17 2033 12/09/17 0637 12/09/17 0700  GLUCAP 124* 57* 62*     Labs:  Lab Results  Component Value Date   WBC 5.0 12/09/2017   HGB 10.4 (L) 12/09/2017   HCT 31.6 (L) 12/09/2017   MCV 102.6 (H) 12/09/2017   PLT 176 12/09/2017   NEUTROABS 3.2 12/06/2017    @LASTCHEMISTRY @  Urine Studies No results for input(s): UHGB, CRYS in the last 72 hours.  Invalid input(s): UACOL, UAPR, USPG, UPH, UTP, UGL, UKET, UBIL, UNIT, UROB, [place], UEPI, UWBC, Duwayne Heck [place], [place]  Basic Metabolic Panel: Recent Labs  Lab 12/05/17 0248 12/06/17 0500 12/07/17 0540 12/08/17 0247 12/09/17 0403  NA 140 140 139 140 140  K 4.0 3.6 3.2* 4.1 4.1  CL 107 104 110 108 108  CO2 24 26 23 24 24   GLUCOSE 90 70 422* 91 85  BUN 15 14 14 10 11   CREATININE 0.80 0.69 0.57 0.61 0.61  CALCIUM 10.6* 10.9*  10.9* 9.2 10.4* 10.7*  MG  --  1.5*  --   --   --   PHOS  --  2.5  --   --   --    GFR Estimated Creatinine Clearance: 60.2 mL/min (by C-G formula based on SCr of 0.61 mg/dL). Liver Function Tests: Recent Labs  Lab 12/04/17 0358 12/05/17 0248 12/06/17 0500 12/07/17 0540 12/08/17 0247  AST 36 44* 42* 35 64*  ALT 22 25 27 26  53  ALKPHOS 59 62 66 50 68  BILITOT 0.5 0.4 0.3 0.2* 0.5  PROT  4.9* 4.6* 4.9* 4.2* 4.7*  ALBUMIN 2.4* 2.4* 2.4* 2.2* 2.4*   No results for input(s): LIPASE, AMYLASE in the last 168 hours. No results for input(s): AMMONIA in the last 168 hours. Coagulation profile Recent Labs  Lab 12/05/17 0248 12/06/17 0500 12/07/17 0540 12/08/17 0247 12/09/17 0403  INR 1.33 1.44 1.64 1.81 2.14    CBC: Recent Labs  Lab 12/05/17 0248 12/06/17 0500 12/07/17 0540 12/08/17 0247 12/09/17 0403  WBC 4.3 4.0 4.2 5.1 5.0  NEUTROABS  --  3.2  --   --   --   HGB 10.0* 10.5* 9.5* 10.2* 10.4*  HCT 29.8* 31.7* 28.1* 30.9* 31.6*  MCV 102.1* 102.6* 102.6* 102.0* 102.6*  PLT 175 181 163 185 176   Cardiac Enzymes: No results for input(s): CKTOTAL, CKMB, CKMBINDEX, TROPONINI in the last 168 hours. BNP: Invalid input(s): POCBNP CBG: Recent Labs  Lab 12/08/17 1148 12/08/17 1624 12/08/17 2033 12/09/17 0637 12/09/17 0700  GLUCAP 86 135* 124* 57* 62*   D-Dimer No results for input(s): DDIMER in the last 72 hours. Hgb A1c No results for input(s):  HGBA1C in the last 72 hours. Lipid Profile No results for input(s): CHOL, HDL, LDLCALC, TRIG, CHOLHDL, LDLDIRECT in the last 72 hours. Thyroid function studies No results for input(s): TSH, T4TOTAL, T3FREE, THYROIDAB in the last 72 hours.  Invalid input(s): FREET3 Anemia work up No results for input(s): VITAMINB12, FOLATE, FERRITIN, TIBC, IRON, RETICCTPCT in the last 72 hours. Microbiology Recent Results (from the past 240 hour(s))  Culture, blood (routine x 2)     Status: None   Collection Time: 11/30/17 12:15 AM  Result Value Ref Range Status   Specimen Description BLOOD LEFT HAND  Final   Special Requests   Final    BOTTLES DRAWN AEROBIC ONLY Blood Culture adequate volume   Culture NO GROWTH 5 DAYS  Final   Report Status 12/05/2017 FINAL  Final  Culture, blood (routine x 2)     Status: None   Collection Time: 11/30/17 12:25 AM  Result Value Ref Range Status   Specimen Description BLOOD LEFT WRIST  Final    Special Requests   Final    BOTTLES DRAWN AEROBIC ONLY Blood Culture adequate volume   Culture NO GROWTH 5 DAYS  Final   Report Status 12/05/2017 FINAL  Final      Studies:  Ct Soft Tissue Neck Wo Contrast  Result Date: 11/29/2017 CLINICAL DATA:  82 y/o F; patient undergoing evaluation for lymphadenopathy with biopsy in the left subclavian area straightening, now presenting with swelling of the left arm and chest with associated severe pain. EXAM: CT NECK WITHOUT CONTRAST TECHNIQUE: Multidetector CT imaging of the neck was performed following the standard protocol without intravenous contrast. COMPARISON:  11/24/2017 CT of the neck. FINDINGS: Pharynx and larynx: Normal. No mass or swelling. Salivary glands: No inflammation, mass, or stone. Thyroid: Normal. Lymph nodes: Interval progression of diffuse lymphadenopathy, for example a left level 5B lymph node measures 12 x 12 mm, previously 6 mm (series 3, image 88). Increased lymphadenopathy is best appreciated left posterior cervical chain and left upper axilla but increase in adenopathy is present diffusely, for example a right upper peritracheal node measuring 24 x 31 mm, previously 17 x 23 mm (series 3, image 121). Fat stranding surrounding conglomerate lymph nodes throughout the right cervical chain, right greater than left supraclavicular regions, and left axilla may represent infiltrative neoplasm or lymphedema. Vascular: Calcific atherosclerosis of the aorta and carotid siphons. Limited intracranial: Negative. Visualized orbits: Negative. Mastoids and visualized paranasal sinuses: Small left maxillary sinus mucous retention cyst. Otherwise negative. Skeleton: Stable mild cervical spondylosis. No high-grade bony canal stenosis. Upper chest: Emphysema and scarring in bilateral upper lobes is stable. Other: None. IMPRESSION: Interval progression of lymphadenopathy from 11/24/2017 best appreciated in the left cervical level 5B station, but measurable  increase is present diffusely. Findings indicate rapidly progressive lymphoproliferative or metastatic disease. Correlation with biopsy is recommended. Electronically Signed   By: Kristine Garbe M.D.   On: 11/29/2017 22:54   Ct Soft Tissue Neck W Contrast  Result Date: 11/24/2017 CLINICAL DATA:  RIGHT neck and ear pain, enlarging RIGHT supraclavicular and retroauricular mass for 2 weeks. LEFT facial swelling. History of carotid artery surgery. EXAM: CT NECK WITH CONTRAST TECHNIQUE: Multidetector CT imaging of the neck was performed using the standard protocol following the bolus administration of intravenous contrast. CONTRAST:  57mL ISOVUE-300 IOPAMIDOL (ISOVUE-300) INJECTION 61% COMPARISON:  CT chest September 11, 2015 FINDINGS: PHARYNX AND LARYNX: Normal.  Widely patent airway. SALIVARY GLANDS: Normal. THYROID: Normal. LYMPH NODES: Multilevel severe RIGHT cervical lymphadenopathy. 27 mm  RIGHT level 1 B lymph node, round RIGHT level IIa 23 mm lymph node, 3.1 cm RIGHT supraclavicular lymph node with pericapsular fat stranding RIGHT neck lymphadenopathy. Mild LEFT neck lymphadenopathy including 11 mm intraparotid lymph nodes and 14 mm LEFT level IIa lymph node. VASCULAR: Normal. LIMITED INTRACRANIAL: Normal. VISUALIZED ORBITS: Surgical clips LEFT neck most compatible with endarterectomy. Severe calcific atherosclerosis RIGHT carotid bifurcation. Lymphadenopathy narrows RIGHT internal jugular vein which remains patent. MASTOIDS AND VISUALIZED PARANASAL SINUSES: Well-aerated. SKELETON: Nonacute. No destructive bony lesions. Patient is edentulous. UPPER CHEST: RIGHT apical bullous changes and scarring. Partially imaged bilateral hilar lymphadenopathy. Mediastinal lymphadenopathy including 2 cm aortopulmonary window lymph node, 17 mm pretracheal lymph node. LEFT greater than RIGHT axillary lymphadenopathy, at least 4 cm nodal conglomeration with LEFT retropectoral infiltrative mass. Heart size is probably  enlarged. Severe coronary artery calcifications. OTHER: IMPRESSION: 1. Severe RIGHT and mild LEFT neck lymphadenopathy. Severe LEFT axillary lymphadenopathy with lymph edema versus lymphangitic spread of tumor. LEFT retropectoral infiltrative mass. Mediastinal lymphadenopathy. Constellation of findings seen with lymphoproliferative disease, metastatic disease such as breast cancer given LEFT chest findings. Recommend CT chest with contrast versus PET-CT. 2. These results will be called to the ordering clinician or representative by the Radiologist Assistant, and communication documented in the PACS or zVision Dashboard. Aortic Atherosclerosis (ICD10-I70.0). Electronically Signed   By: Elon Alas M.D.   On: 11/24/2017 20:19   Ct Chest W Contrast  Result Date: 11/29/2017 CLINICAL DATA:  Left-sided chest pain and swelling, recent biopsy on right supraclavicular lymph nodes EXAM: CT CHEST WITH CONTRAST TECHNIQUE: Multidetector CT imaging of the chest was performed during intravenous contrast administration. CONTRAST:  81mL ISOVUE-300 IOPAMIDOL (ISOVUE-300) INJECTION 61% COMPARISON:  11/24/2017 FINDINGS: Cardiovascular: Atherosclerotic changes of the thoracic aorta are noted without aneurysmal dilatation or dissection. Coronary calcifications are seen. No pulmonary emboli are noted. Some attenuation of the pulmonary arterial branches is noted secondary to hilar adenopathy. Additionally some displacement of the vasculature in the neck is noted particularly on the right also related to adenopathy. Mediastinum/Nodes: Thoracic inlet demonstrates evidence of significant right supraclavicular lymphadenopathy. The largest nodal mass measures approximately 10 by 4.9 cm and causes displacement of the adjacent vascular structures in the right neck. Additional adenopathy is noted in the anterior neck similar to that seen on prior examination. Significant mediastinal and hilar adenopathy is identified. The largest of  these in the right hilum measures 2.3 cm in short axis. Subcarinal lymphadenopathy is noted measuring 16 mm in short axis. Prevascular adenopathy is noted measuring 2.2 cm in short axis. The esophagus is within normal limits. Considerable adenopathy is noted in the posterior mediastinum surrounding the descending aorta. The nodal mass measures approximately 2.9 by 3.9 cm. Considerable right axillary adenopathy is noted extending along the lateral chest wall. Lungs/Pleura: Emphysematous changes are noted in the lungs bilaterally. Mild scarring is seen. No focal parenchymal mass is noted. No sizable effusion is seen. Upper Abdomen: Gallbladder has been surgically removed. The liver is within normal limits. The adrenal glands and visualized spleen are unremarkable. Musculoskeletal: T9 compression deformity is noted. Some lucency is noted within. It would be difficult to exclude some metastatic involvement given the findings throughout the chest. Degenerative changes of the thoracic spine are seen. No rib abnormality is noted. Considerable soft tissue mass is noted in the left anterior chest wall involving the pectoral muscles and subpectoral region. The overall size of the mass measures approximately 10.5 by 6.7 cm and Ing gallstone the subclavian and axillary artery is  and veins. Significant skin thickening and edema is noted within the left breast. It would be difficult to exclude a portion of this representing a breast mass. There is apparent involvement of the chest wall although no bony destruction is seen. IMPRESSION: Diffuse lymphadenopathy involving the supraclavicular regions, mediastinum, bilateral hila as well as the axillary regions bilaterally as described above. Correlation with the recent biopsy results is recommended. It would be difficult to exclude a portion of the abnormality in the left chest wall to be related to the overlying breast given the degree of skin thickening and edema identified  inferiorly. Alternatively this may represent an aggressive lymphoma. Further workup with PET-CT is recommended. T9 compression deformity with some lucencies noted within. This may be strictly related to the compression deformity although the possibility of metastatic disease would deserve consideration as well. No other definitive bony abnormality is noted. Aortic Atherosclerosis (ICD10-I70.0) and Emphysema (ICD10-J43.9). Electronically Signed   By: Inez Catalina M.D.   On: 11/29/2017 20:01   Mr Neck Soft Tissue Only W Wo Contrast  Result Date: 11/30/2017 CLINICAL DATA:  Rapidly progressive lymphadenopathy, RIGHT arm swelling since Thanksgiving. Suspected lymphoma. Status post biopsy, results pending. EXAM: MRI OF THE NECK WITH CONTRAST TECHNIQUE: Multiplanar, multisequence MR imaging was performed following the administration of intravenous contrast. CONTRAST:  61mL MULTIHANCE GADOBENATE DIMEGLUMINE 529 MG/ML IV SOLN COMPARISON:  CT neck November 29, 2017 and CT neck November 24, 2017 FINDINGS: Moderately motion degraded examination. PHARYNX AND LARYNX: Normal.  Widely patent airway. SALIVARY GLANDS: Intraparotid lymph nodes bilaterally measuring to 17 mm in deep lobe. No definite primary parotid mass though limited by motion. THYROID: Normal. LYMPH NODES: Severe RIGHT greater than LEFT cervical lymphadenopathy with intermediate to bright T2 signal and, mild enhancement. Lymphadenopathy all levels of the neck, at least 8.4 x 4.2 cm RIGHT supraclavicular nodal conglomeration. Lymphadenopathy abuts the scalene muscles impinges upon the RIGHT brachials plexus. Limited assessment of the brachium plexus due to motion. Massive LEFT retropectoral lymphadenopathy, lesser extent on the RIGHT. VASCULAR: Normal flow-voids. LIMITED INTRACRANIAL: Normal. VISUALIZED ORBITS: Normal. MASTOIDS AND VISUALIZED PARANASAL SINUSES: Well-aerated. SKELETON: No abnormal bone marrow signal though not tailored for evaluation. Limited  assessment of spinal cord due to motion, no suspicious intracanalicular enhancement. UPPER CHEST: Lung apices are clear. No superior mediastinal lymphadenopathy. OTHER: Susceptibility artifact LEFT neck corresponding to use surgical clips. IMPRESSION: 1. Moderately motion degraded examination. 2. Bulky cervical and included chest lymphadenopathy with RIGHT brachial plexus impingement. Electronically Signed   By: Elon Alas M.D.   On: 11/30/2017 22:16   Dg Chest Portable 1 View  Result Date: 11/29/2017 CLINICAL DATA:  Abnormal lymphadenopathy in the neck, chest swelling, initial encounter EXAM: PORTABLE CHEST 1 VIEW COMPARISON:  11/24/2017 FINDINGS: Cardiac shadow is within normal limits. Soft tissue mass lesion is again noted superimposed over the left hilum similar to that seen on recent CT of the neck. Fullness in the hilar regions is noted bilaterally likely representing some adenopathy. Aortic calcifications are seen. The lungs are clear. No bony abnormality is noted. IMPRESSION: Changes consistent with the known left upper lobe mass lesion as well as apparent hilar adenopathy particularly on the right. CT of the chest is recommended for further evaluation as previously described. Electronically Signed   By: Inez Catalina M.D.   On: 11/29/2017 19:10   [place] Core Biopsy (lymph Nodes)  Result Date: 12/02/2017 INDICATION: 82 year old female with presumed lymphoma. She presents for biopsy of nodal mass of the left axilla. EXAM: ULTRASOUND-GUIDED LYMPH  NODE BIOPSY MEDICATIONS: None. ANESTHESIA/SEDATION: Moderate (conscious) sedation was employed during this procedure. A total of Versed 2.0 mg and Fentanyl 50 mcg was administered intravenously. Moderate Sedation Time: 10 minutes. The patient's level of consciousness and vital signs were monitored continuously by radiology nursing throughout the procedure under my direct supervision. FLUOROSCOPY TIME:  None COMPLICATIONS: None PROCEDURE: Informed written  consent was obtained from the patient after a thorough discussion of the procedural risks, benefits and alternatives. All questions were addressed. Maximal Sterile Barrier Technique was utilized including caps, mask, sterile gowns, sterile gloves, sterile drape, hand hygiene and skin antiseptic. A timeout was performed prior to the initiation of the procedure. Patient positioned supine position on the ultrasound stretcher. Ultrasound images of the left axillary region were performed with images stored and sent to PACs. The patient is prepped and draped in the usual sterile fashion. The skin and subcutaneous tissues were generously infiltrated 1% lidocaine for local anesthesia. Using ultrasound guidance, at least 8 separate 18 gauge core biopsy of the left axillary node were performed. Specimen placed in the saline. Final image was stored. Patient tolerated the procedure well and remained hemodynamically stable throughout. No complications were encountered and no significant blood loss. IMPRESSION: Status post ultrasound-guided biopsy of left axillary mass. Tissue specimen sent to pathology for complete histopathologic analysis. Signed, Dulcy Fanny. Earleen Newport, DO [facility] Radiology Electronically Signed   By: Corrie Mckusick D.O.   On: 12/02/2017 15:51   [place] Core Biopsy (lymph Nodes)  Result Date: 11/28/2017 INDICATION: 82 year old female with a clinical history of rapidly progressive right submental, cervical and supraclavicular lymphadenopathy. She presents today for urgent ultrasound-guided core biopsy. EXAM: ULTRASOUND GUIDED core needle BIOPSY OF right supraclavicular lymph node MEDICATIONS: None. ANESTHESIA/SEDATION: Fentanyl 100 mcg IV; Versed 2 mg IV Moderate Sedation Time:  15 minutes The patient was continuously monitored during the procedure by the interventional radiology nurse under my direct supervision. PROCEDURE: The procedure, risks, benefits, and  alternatives were explained to the patient. Questions regarding the procedure were encouraged and answered. The patient understands and consents to the procedure. The right submental region, the right cervical chain in the right supraclavicular lymph nodes or all interrogated with ultrasound. There are numerous enlarged, hypoechoic an abnormal appearing lymph nodes in each station. The most solid appearing lymph nodes are present in the right supraclavicular station. The right submental lymph node is slightly more cystic and may not yield viable cells. Therefore, the decision was made to proceed with biopsy of the right supraclavicular station lymph nodes. The right supraclavicular region was prepped with chlorhexidine in a sterile fashion, and a sterile drape was applied covering the operative field. A sterile gown and sterile gloves were used for the procedure. Local anesthesia was provided with 1% Lidocaine. A small dermatotomy was made. Under real-time sonographic guidance, numerous 18 gauge core biopsies were obtained of several lymph nodes in the right supraclavicular station. Biopsy specimens were placed in saline and delivered to pathology for further analysis. Post biopsy imaging demonstrates no evidence of hematoma or active bleeding. The patient tolerated the procedure well. There was no significant bleeding. COMPLICATIONS: None immediate. FINDINGS: Extensive hypoechoic lymphadenopathy throughout the right neck and right submental region. IMPRESSION: Technically successful ultrasound-guided core biopsy of right supraclavicular lymph nodes. Electronically Signed   By: Jacqulynn Cadet M.D.   On: 11/28/2017 09:04    Assessment: 82 y.o. [place] woman with a diffuse large cell B-cell non-Hodgkin's lymphoma, high-grade, diagnosed by lymph node biopsy December  2018  (1) prednisone started 12/03/2017, to be continued for 5 days  (2) rituximab weekly started 12/04/2017  (3) to receiveCHOP with  infusional doxorubicin 12/20 via PICC  (4) no evidence of tumor lysis  (5) hypercalcemia--related to NHL; will check PTH related peptide  (6) placement: may need SNF/Rehab   Plan: Lorri did vey well with prednisone (no hyperglycemia) and rituximab (no TLS). She is clinically improved though still can't use her arm. I understand plan is to transfer to Rehab.  Today we discussed infusional CHOP-rituxan. She has a good understanding of the possible toxicities, side effects and compications. She has a PICC in place which we can use for the doxorubicin infusion. She had an echo 08/08/2015 which showed a good EF--we can use that but will try to obtain an echo today to make sure EF still WNR.  Plan then would be for chemo 12/20 (CHOP) and 12/21 (rituximab). Will follow closely with you   Chauncey Cruel, MD 12/09/2017  7:59 AM Medical Oncology and Hematology [facility] [address] [place], [place] [address] Tel. [phone]    Fax. [phone]

## 2017-12-09 NOTE — Progress Notes (Signed)
Physical Therapy Treatment Patient Details Name: Colleen [name] MRN: [id number] DOB: 1935/09/12 Today's Date: 12/09/2017    History of Present Illness Pt. with PMH of A. fib, CHF, HTN, hypothyroidism, CVA; admitted on 11/29/2017, presented with complaint of right arm pain and swelling, SOB PTA, was found to have brachial neuritis with rapidly developing diffuse lymphadenopathy involving neck, axillae, mediastinum. Lymph node biopsy done on 12/02/17 showed likely diffuse large B-cell non Hodgkin's lymphoma.    PT Comments    Patient received up in chair, pleasant and willing to participate in PT this afternoon. Began with functional sit to stand transfer training for which patient required Mod assist and multiple attempts from low surface; able to maintain standing for approximately 3 minutes for standing there ex before patient requested to return back to sitting due to fatigue 7-8/10. Education regarding fatigue provided during rest breaks and between bouts of seated LE exercises. Attempted sit to stand again at end of session however patient requiring Max assist to barely clear rear from chair, unable to reach full stand from low surface with assist of 1 due to high levels of fatigue this afternoon. Session highly limited due to fatigue. Patient left up in chair with all needs met this afternoon.     Follow Up Recommendations  CIR(pending progress )     Equipment Recommendations  Other (comment)(TBD pending progress )    Recommendations for Other Services Rehab consult;OT consult     Precautions / Restrictions Precautions Precautions: Other (comment)(monitor ) Precaution Comments: watch O2 Restrictions Weight Bearing Restrictions: No    Mobility  Bed Mobility               General bed mobility comments: DNT, received up in chair   Transfers Overall transfer level: Needs assistance Equipment used: Rolling walker (2 wheeled) Transfers: Sit to/from Stand Sit to Stand: Mod  assist         General transfer comment: cues for hand placement and for technique with sit to stand from low surface; most of push coming from L UE and LEs  Ambulation/Gait             General Gait Details: attempted, patient limited by fatigue following standing exercises   Stairs            Wheelchair Mobility    Modified Rankin (Stroke Patients Only)       Balance Overall balance assessment: Needs assistance Sitting-balance support: Bilateral upper extremity supported;Feet supported Sitting balance-Leahy Scale: Good     Standing balance support: Bilateral upper extremity supported Standing balance-Leahy Scale: Fair Standing balance comment: reliant on support from L UE on walker                             Cognition Arousal/Alertness: Awake/alert Behavior During Therapy: WFL for tasks assessed/performed Overall Cognitive Status: Within Functional Limits for tasks assessed                                 General Comments: Very pleasant and willing to try despite pain and fatigue       Exercises      General Comments        Pertinent Vitals/Pain Pain Assessment: Faces Faces Pain Scale: Hurts little more Pain Location: RUE when moved Pain Descriptors / Indicators: Aching Pain Intervention(s): Monitored during session;Limited activity within patient's tolerance;Repositioned    Home Living  Prior Function            PT Goals (current goals can now be found in the care plan section) Acute Rehab PT Goals Patient Stated Goal: Get to rehab to be able to take care of myself PT Goal Formulation: With patient Time For Goal Achievement: 12/20/17 Potential to Achieve Goals: Good Progress towards PT goals: Progressing toward goals    Frequency    Min 3X/week      PT Plan Current plan remains appropriate    Co-evaluation              AM-PAC PT "6 Clicks" Daily Activity  Outcome  Measure  Difficulty turning over in bed (including adjusting bedclothes, sheets and blankets)?: Unable Difficulty moving from lying on back to sitting on the side of the bed? : Unable Difficulty sitting down on and standing up from a chair with arms (e.g., wheelchair, bedside commode, etc,.)?: Unable Help needed moving to and from a bed to chair (including a wheelchair)?: A Little Help needed walking in hospital room?: A Little Help needed climbing 3-5 steps with a railing? : A Lot 6 Click Score: 11    End of Session Equipment Utilized During Treatment: Gait belt Activity Tolerance: Patient tolerated treatment well Patient left: in chair;with call bell/phone within reach Nurse Communication: Other (comment)(purewick removed for PT ) PT Visit Diagnosis: Unsteadiness on feet (R26.81);Difficulty in walking, not elsewhere classified (R26.2);Muscle weakness (generalized) (M62.81);Pain Pain - Right/Left: Right Pain - part of body: Shoulder;Arm;Hand     Time: 0865-7846 PT Time Calculation (min) (ACUTE ONLY): 29 min  Charges:  $Therapeutic Exercise: 23-37 mins                    G Codes:       Deniece Ree PT, DPT, CBIS  Supplemental Physical Therapist [place]

## 2017-12-09 NOTE — Progress Notes (Signed)
Occupational Therapy Treatment Patient Details Name: Colleen [name] MRN: [id number] DOB: [birth year] Today's Date: 12/09/2017    History of present illness Pt. with PMH of A. fib, CHF, HTN, hypothyroidism, CVA; admitted on 11/29/2017, presented with complaint of right arm pain and swelling, SOB PTA, was found to have brachial neuritis with rapidly developing diffuse lymphadenopathy involving neck, axillae, mediastinum. Lymph node biopsy done on 12/02/17 showed likely diffuse large B-cell non Hodgkin's lymphoma.   OT comments  Pt demonstrating progress toward OT goals this session. She presented to OT with fatigue following PT session but motivated to participate. Facilitated improved functional use of R UE and edema management as detailed below as a precursor to improved ADL independence. She was able to achieve improved AAROM of R UE this session. Pt continues to demonstrate significant R UE edema and positioned in elevated position to improve management of this. She remains an excellent CIR candidate. Will continue to follow while admitted.     Follow Up Recommendations  CIR;Supervision/Assistance - 24 hour    Equipment Recommendations  Other (comment)(defer to next venue of care)    Recommendations for Other Services Rehab consult    Precautions / Restrictions Precautions Precautions: Other (comment)(monitor) Precaution Comments: watch O2 Restrictions Weight Bearing Restrictions: No       Mobility Bed Mobility               General bed mobility comments: Up in chair on my arrival.   Transfers Overall transfer level: Needs assistance Equipment used: Rolling walker (2 wheeled) Transfers: Sit to/from Stand Sit to Stand: Mod assist         General transfer comment: cues for hand placement and for technique with sit to stand from low surface; most of push coming from L UE and LEs    Balance Overall balance assessment: Needs assistance Sitting-balance support:  Bilateral upper extremity supported;Feet supported Sitting balance-Leahy Scale: Good     Standing balance support: Bilateral upper extremity supported Standing balance-Leahy Scale: Fair Standing balance comment: reliant on support from L UE on walker                            ADL either performed or assessed with clinical judgement   ADL Overall ADL's : Needs assistance/impaired                                       General ADL Comments: Session focused on facilitating improved use of R UE as a precursor for functional tasks.      Vision   Vision Assessment?: No apparent visual deficits   Perception     Praxis      Cognition Arousal/Alertness: Awake/alert Behavior During Therapy: WFL for tasks assessed/performed Overall Cognitive Status: Within Functional Limits for tasks assessed                                 General Comments: Very pleasant and willing to try despite pain and fatigue         Exercises Other Exercises Other Exercises: Facilitated improved functional use of R UE with R hand composite grasp (able to achieve ~60% this session), and active R digit opposition.  Other Exercises: Facilitated improved functional reach skills with AAROM R elbow and shoulder with pt able to initiate 15% of  task.  Other Exercises: Positioned pt's R UE elevated on pillow with improved success this session due to decreased pain.  Other Exercises: Facilitated improved strength of scapular stabilizers (as pt demonstrating upper trap hiking with R GH movement) with scapular elevation/depression as well as protraction/retraction.    Shoulder Instructions       General Comments VSS throughout session    Pertinent Vitals/ Pain       Pain Assessment: Faces Faces Pain Scale: Hurts even more Pain Location: RUE when moved Pain Descriptors / Indicators: Aching Pain Intervention(s): Monitored during session;Limited activity within patient's  tolerance;Repositioned  Home Living                                          Prior Functioning/Environment              Frequency  Min 3X/week        Progress Toward Goals  OT Goals(current goals can now be found in the care plan section)  Progress towards OT goals: Progressing toward goals  Acute Rehab OT Goals Patient Stated Goal: Get to rehab to be able to take care of myself OT Goal Formulation: With patient Time For Goal Achievement: 12/21/17 Potential to Achieve Goals: Good  Plan Discharge plan remains appropriate    Co-evaluation                 AM-PAC PT "6 Clicks" Daily Activity     Outcome Measure   Help from another person eating meals?: A Lot Help from another person taking care of personal grooming?: A Lot Help from another person toileting, which includes using toliet, bedpan, or urinal?: Total Help from another person bathing (including washing, rinsing, drying)?: A Lot Help from another person to put on and taking off regular upper body clothing?: A Lot Help from another person to put on and taking off regular lower body clothing?: A Lot 6 Click Score: 11    End of Session Equipment Utilized During Treatment: Gait belt  OT Visit Diagnosis: Other abnormalities of gait and mobility (R26.89);Muscle weakness (generalized) (M62.81);Hemiplegia and hemiparesis Hemiplegia - Right/Left: Right Hemiplegia - dominant/non-dominant: Dominant Hemiplegia - caused by: (R UE lymphedema)   Activity Tolerance Patient tolerated treatment well   Patient Left in chair;with call bell/phone within reach   Nurse Communication Mobility status(OT POC)        Time: 8185-6314 OT Time Calculation (min): 13 min  Charges: OT General Charges $OT Visit: 1 Visit OT Treatments $Therapeutic Exercise: 8-22 mins  [name], MS OTR/L  Pager: [name] 12/09/2017, 4:33 PM

## 2017-12-09 NOTE — Progress Notes (Signed)
CSW alerted that patient will be admitting to CIR. No further CSW needs.  CSW signing off.  Laveda Abbe, Learned Clinical Social Worker [phone]

## 2017-12-09 NOTE — H&P (Signed)
Physical Medicine and Rehabilitation Admission H&P    Chief Complaint  Patient presents with  . left arm pain  . Facial Swelling  : HPI: Colleen [name] is a 82 y.o. right handed female with history of atrial fibrillation with chronic Coumadin, diastolic congestive heart failure, hypertension, TIA and remote tobacco abuse. History taken from chart review and patient. Per report patient lives with spouse and son. Independent prior to admission. Multilevel home bath and bedroom upstairs. Spouse with limited physical assistance and spouse and son work during the day. Presented 11/29/2017 with complaints of right arm pain and swelling as well as recent reports of a mass in her right neck. CT of the chest showed significant adenopathy bilaterally. MRI soft tissue of the neck showed bulky cervical and included chest lymphadenopathy with right brachial plexus impingement. Lymph node biopsy 12/02/2017 showed likely diffuse large B-cell non-Hodgkin's lymphoma. Hematology consulted/Dr. Magrinat placed on prednisone/allopurinol as well as Weekly rituximab initiated 12/04/2017. Plan is to receive [facility] with infusional doxorubicin 12/20 per hematology/oncology services Chronic Coumadin has been resumed. Physical and occupational therapy evaluations completed with recommendations of physical medicine rehabilitation consult.Patient was admitted for a comprehensive rehabilitation program    Review of Systems  Constitutional: Positive for malaise/fatigue. Negative for fever.  HENT: Negative for hearing loss.   Eyes: Negative for blurred vision and double vision.  Respiratory: Positive for shortness of breath. Negative for cough.   Cardiovascular: Positive for palpitations and leg swelling. Negative for chest pain.  Gastrointestinal: Positive for constipation. Negative for nausea and vomiting.  Genitourinary: Negative for dysuria, flank pain and hematuria.  Musculoskeletal: Positive for joint pain and  myalgias.  Skin: Negative for rash.  Neurological: Positive for weakness. Negative for seizures.  All other systems reviewed and are negative.  Past Medical History:  Diagnosis Date  . Atrial fibrillation ([place])   . Carotid artery disease ([place])   . CHF (congestive heart failure) ([place])   . Hyperlipidemia   . Hypertension   . Hypothyroidism   . Shortness of breath   . TIA (transient ischemic attack) 2001   Past Surgical History:  Procedure Laterality Date  . ABDOMINAL HYSTERECTOMY  1975   TAH,BSO  . CARDIAC CATHETERIZATION N/A 08/14/2015   Procedure: Right Heart Cath;  Surgeon: Larey Dresser, MD;  Location: [facility];  Service: Cardiovascular;  Laterality: N/A;  . carotid duplex  11/24/2012  . carotid duplex  11/01/2010  . CAROTID ENDARTERECTOMY  2001   left  . CHOLECYSTECTOMY    . DOPPLER ECHOCARDIOGRAPHY  02/13/2010   EF>55%  . DOPPLER ECHOCARDIOGRAPHY  09/29/2007  . NM MYOVIEW LTD  02/13/2010  . renal duplex  02/13/2010  . VIDEO BRONCHOSCOPY Bilateral 08/09/2015   Procedure: VIDEO BRONCHOSCOPY WITHOUT FLUORO;  Surgeon: Collene Gobble, MD;  Location: [facility];  Service: Cardiopulmonary;  Laterality: Bilateral;   Family History  Problem Relation Age of Onset  . Heart failure Mother   . Parkinson's disease Mother   . Arthritis Mother   . Arthritis Sister   . Colon cancer Brother   . Rheumatologic disease Neg Hx   . Lung disease Neg Hx    Social History:  reports that she quit smoking about 17 years ago. Her smoking use included cigarettes. She has a 47.00 pack-year smoking history. she has never used smokeless tobacco. She reports that she drinks alcohol. She reports that she does not use drugs. Allergies: No Known Allergies Medications Prior to Admission  Medication Sig Dispense  Refill  . acetaminophen (TYLENOL) 500 MG tablet Take 1,000 mg by mouth every 6 (six) hours as needed for headache (pain).    . flecainide (TAMBOCOR) 100 MG tablet Take 50 mg by mouth  2 (two) times daily.    . furosemide (LASIX) 40 MG tablet Take 1 tablet (40 mg total) by mouth daily. Will take extra 91m PRN for swelling (Patient taking differently: Take 40 mg by mouth See admin instructions. Take 1 tablet (40 mg) by mouth daily, may also take extra tablet (479m as needed for ankle/ leg swelling) 30 tablet   . levothyroxine (SYNTHROID, LEVOTHROID) 88 MCG tablet Take 88 mcg by mouth daily before breakfast.     . lisinopril (PRINIVIL,ZESTRIL) 20 MG tablet Take 1 tablet (20 mg total) by mouth 2 (two) times daily. (Patient taking differently: Take 20 mg by mouth daily. ) 60 tablet 6  . warfarin (COUMADIN) 5 MG tablet Take 2.5-5 mg by mouth See admin instructions. Take 1 tablet (5 mg) by mouth on Monday and Friday after supper, take 1/2 tablet (2.5 mg) on Sunday, Tuesday, Wednesday, Thursday, Saturday with supper      Drug Regimen Review Drug regimen was reviewed and remains appropriate with no significant issues identified  Home: Home Living Family/patient expects to be discharged to:: Inpatient rehab Living Arrangements: Spouse/significant other Available Help at Discharge: Family, Available 24 hours/day(husband limited physically) Type of Home: House Home Access: Stairs to enter [employer] Steps: 4 Home Layout: Multi-level, Bed/bath upstairs Alternate Level Stairs-Number of Steps: approx 9 steps with rails Alternate Level Stairs-Rails: Right, Left Home Equipment: Walker - 2 wheels, Shower seat, Bedside commode, Wheelchair - manual   Functional History: Prior Function Level of Independence: Independent  Functional Status:  Mobility: Bed Mobility Overal bed mobility: Needs Assistance Bed Mobility: Sit to Supine Supine to sit: Mod assist Sit to supine: Min assist General bed mobility comments: Pt required increased time and effort but able to lift B LEs against gravity unassisted with increased time.  LLE starting to fall out and min assist to  maintain placement of LLE.  Pt required min assist to [facility]  Transfers Overall transfer level: Needs assistance Equipment used: Rolling walker (2 wheeled) Transfers: Sit to/from Stand Sit to Stand: Mod assist Stand pivot transfers: Min assist General transfer comment: Cues for hand placement to and from seated surface.  Pt able to push from commode with LUE.  Once in standing patient able to place RUE hand on RW hand grip. Ambulation/Gait Ambulation/Gait assistance: Min assist Ambulation Distance (Feet): 10 Feet Assistive device: Rolling walker (2 wheeled) Gait Pattern/deviations: Step-through pattern, Trunk flexed, Narrow base of support, Shuffle General Gait Details: Cues for sequencing and turning.  PTA assisted patient with balance and RW during turns and backing.   Gait velocity interpretation: Below normal speed for age/gender    ADL: ADL Overall ADL's : Needs assistance/impaired Eating/Feeding: Moderate assistance, Sitting Grooming: Minimal assistance, Sitting Upper Body Bathing: Sitting, Maximal assistance Lower Body Bathing: Sit to/from stand, Total assistance Upper Body Dressing : Sitting, Maximal assistance Lower Body Dressing: Total assistance, Sit to/from stand Toilet Transfer: Moderate assistance, Stand-pivot, BSC Toilet Transfer Details (indicate cue type and reason): Simulated from bed to chair.  Toileting- Clothing Manipulation and Hygiene: Total assistance, Sit to/from stand Functional mobility during ADLs: Moderate assistance(stand-pivot only) General ADL Comments: Pt with generalized B LE weakness impacting mobility. Significant difficulty with bimanual tasks.   Cognition: Cognition Overall Cognitive Status: Within Functional Limits  for tasks assessed Orientation Level: Oriented X4 Cognition Arousal/Alertness: Awake/alert Behavior During Therapy: WFL for tasks assessed/performed Overall Cognitive Status: Within Functional Limits for  tasks assessed General Comments: Very pleasant and willing to try despite pain and fatigue   Physical Exam: Blood pressure (!) 175/103, pulse (!) 59, temperature 97.9 F (36.6 C), temperature source Oral, resp. rate 16, height '5\' 4"'  (1.626 m), weight 88 kg (194 lb 0.1 oz), SpO2 92 %. Physical Exam  Vitals reviewed. Constitutional:  obese  HENT:  Head: Normocephalic.  Eyes: Conjunctivae and EOM are normal. Pupils are equal, round, and reactive to light.  Neck: Normal range of motion. Neck supple. No tracheal deviation present. No thyromegaly present.  Cardiovascular: Regular rhythm and normal heart sounds. Exam reveals no friction rub.  Cardiac rate controlled  Respiratory: No respiratory distress. She has no wheezes.  Fair inspiratory effort clear to auscultation  GI: Soft. Bowel sounds are normal. She exhibits no distension.  Musculoskeletal: Edema: 3+ edema RUE, limb tender to palpation, no warmth or erythema.  Psychiatric: She has a normal mood and affect. Her behavior is normal. Judgment and thought content normal.   Skin. Warm and dry Musculoskeletal: She exhibits no tenderness.  LE edema  Neurological: She is alert and oriented to person, place, and time.  Motor: RUE: Hand grip 4-/5, shoulder/elbow limited by pain. ?1-2/5.  LUE: 5/5 proximal to distal B/L LE:HF 4-/5, KE 4/5, ADF/PF 4+/5. Sensation normal. DTR's 1+    Results for orders placed or performed during the hospital encounter of 11/29/17 (from the past 48 hour(s))  Glucose, capillary     Status: Abnormal   Collection Time: 12/07/17  5:41 PM  Result Value Ref Range   Glucose-Capillary 158 (H) 65 - 99 mg/dL  Heparin level (unfractionated)     Status: Abnormal   Collection Time: 12/07/17  6:00 PM  Result Value Ref Range   Heparin Unfractionated 1.09 (H) 0.30 - 0.70 IU/mL    Comment:        IF HEPARIN RESULTS ARE BELOW EXPECTED VALUES, AND PATIENT DOSAGE HAS BEEN CONFIRMED, SUGGEST FOLLOW UP TESTING OF  ANTITHROMBIN III LEVELS.   Glucose, capillary     Status: Abnormal   Collection Time: 12/07/17  8:26 PM  Result Value Ref Range   Glucose-Capillary 153 (H) 65 - 99 mg/dL  Protime-INR     Status: Abnormal   Collection Time: 12/08/17  2:47 AM  Result Value Ref Range   Prothrombin Time 20.8 (H) 11.4 - 15.2 seconds   INR 1.81   CBC     Status: Abnormal   Collection Time: 12/08/17  2:47 AM  Result Value Ref Range   WBC 5.1 4.0 - 10.5 K/uL   RBC 3.03 (L) 3.87 - 5.11 MIL/uL   Hemoglobin 10.2 (L) 12.0 - 15.0 g/dL   HCT 30.9 (L) 36.0 - 46.0 %   MCV 102.0 (H) 78.0 - 100.0 fL   MCH 33.7 26.0 - 34.0 pg   MCHC 33.0 30.0 - 36.0 g/dL   RDW 14.6 11.5 - 15.5 %   Platelets 185 150 - 400 K/uL  Heparin level (unfractionated)     Status: Abnormal   Collection Time: 12/08/17  2:47 AM  Result Value Ref Range   Heparin Unfractionated 1.01 (H) 0.30 - 0.70 IU/mL    Comment:        IF HEPARIN RESULTS ARE BELOW EXPECTED VALUES, AND PATIENT DOSAGE HAS BEEN CONFIRMED, SUGGEST FOLLOW UP TESTING OF ANTITHROMBIN III LEVELS.   Comprehensive metabolic  panel     Status: Abnormal   Collection Time: 12/08/17  2:47 AM  Result Value Ref Range   Sodium 140 135 - 145 mmol/L   Potassium 4.1 3.5 - 5.1 mmol/L    Comment: NO VISIBLE HEMOLYSIS   Chloride 108 101 - 111 mmol/L   CO2 24 22 - 32 mmol/L   Glucose, Bld 91 65 - 99 mg/dL   BUN 10 6 - 20 mg/dL   Creatinine, Ser 0.61 0.44 - 1.00 mg/dL   Calcium 10.4 (H) 8.9 - 10.3 mg/dL   Total Protein 4.7 (L) 6.5 - 8.1 g/dL   Albumin 2.4 (L) 3.5 - 5.0 g/dL   AST 64 (H) 15 - 41 U/L   ALT 53 14 - 54 U/L   Alkaline Phosphatase 68 38 - 126 U/L   Total Bilirubin 0.5 0.3 - 1.2 mg/dL   GFR calc non Af Amer >60 >60 mL/min   GFR calc Af Amer >60 >60 mL/min    Comment: (NOTE) The eGFR has been calculated using the CKD EPI equation. This calculation has not been validated in all clinical situations. eGFR's persistently <60 mL/min signify possible Chronic Kidney Disease.     Anion gap 8 5 - 15  Lactate dehydrogenase     Status: Abnormal   Collection Time: 12/08/17  2:47 AM  Result Value Ref Range   LDH 348 (H) 98 - 192 U/L  Uric acid     Status: Abnormal   Collection Time: 12/08/17  2:47 AM  Result Value Ref Range   Uric Acid, Serum 1.7 (L) 2.3 - 6.6 mg/dL  Glucose, capillary     Status: Abnormal   Collection Time: 12/08/17  6:02 AM  Result Value Ref Range   Glucose-Capillary 56 (L) 65 - 99 mg/dL  Glucose, capillary     Status: None   Collection Time: 12/08/17  7:52 AM  Result Value Ref Range   Glucose-Capillary 88 65 - 99 mg/dL  Glucose, capillary     Status: None   Collection Time: 12/08/17 11:48 AM  Result Value Ref Range   Glucose-Capillary 86 65 - 99 mg/dL  Heparin level (unfractionated)     Status: Abnormal   Collection Time: 12/08/17  1:12 PM  Result Value Ref Range   Heparin Unfractionated 1.07 (H) 0.30 - 0.70 IU/mL    Comment:        IF HEPARIN RESULTS ARE BELOW EXPECTED VALUES, AND PATIENT DOSAGE HAS BEEN CONFIRMED, SUGGEST FOLLOW UP TESTING OF ANTITHROMBIN III LEVELS.   Glucose, capillary     Status: Abnormal   Collection Time: 12/08/17  4:24 PM  Result Value Ref Range   Glucose-Capillary 135 (H) 65 - 99 mg/dL  Glucose, capillary     Status: Abnormal   Collection Time: 12/08/17  8:33 PM  Result Value Ref Range   Glucose-Capillary 124 (H) 65 - 99 mg/dL  Heparin level (unfractionated)     Status: None   Collection Time: 12/08/17 11:06 PM  Result Value Ref Range   Heparin Unfractionated 0.62 0.30 - 0.70 IU/mL    Comment:        IF HEPARIN RESULTS ARE BELOW EXPECTED VALUES, AND PATIENT DOSAGE HAS BEEN CONFIRMED, SUGGEST FOLLOW UP TESTING OF ANTITHROMBIN III LEVELS.   Protime-INR     Status: Abnormal   Collection Time: 12/09/17  4:03 AM  Result Value Ref Range   Prothrombin Time 23.7 (H) 11.4 - 15.2 seconds   INR 2.14   CBC     Status:  Abnormal   Collection Time: 12/09/17  4:03 AM  Result Value Ref Range   WBC 5.0 4.0 -  10.5 K/uL   RBC 3.08 (L) 3.87 - 5.11 MIL/uL   Hemoglobin 10.4 (L) 12.0 - 15.0 g/dL   HCT 31.6 (L) 36.0 - 46.0 %   MCV 102.6 (H) 78.0 - 100.0 fL   MCH 33.8 26.0 - 34.0 pg   MCHC 32.9 30.0 - 36.0 g/dL   RDW 15.1 11.5 - 15.5 %   Platelets 176 150 - 400 K/uL  Basic metabolic panel     Status: Abnormal   Collection Time: 12/09/17  4:03 AM  Result Value Ref Range   Sodium 140 135 - 145 mmol/L   Potassium 4.1 3.5 - 5.1 mmol/L   Chloride 108 101 - 111 mmol/L   CO2 24 22 - 32 mmol/L   Glucose, Bld 85 65 - 99 mg/dL   BUN 11 6 - 20 mg/dL   Creatinine, Ser 0.61 0.44 - 1.00 mg/dL   Calcium 10.7 (H) 8.9 - 10.3 mg/dL   GFR calc non Af Amer >60 >60 mL/min   GFR calc Af Amer >60 >60 mL/min    Comment: (NOTE) The eGFR has been calculated using the CKD EPI equation. This calculation has not been validated in all clinical situations. eGFR's persistently <60 mL/min signify possible Chronic Kidney Disease.    Anion gap 8 5 - 15  Uric acid     Status: None   Collection Time: 12/09/17  4:03 AM  Result Value Ref Range   Uric Acid, Serum 2.3 2.3 - 6.6 mg/dL  Heparin level (unfractionated)     Status: None   Collection Time: 12/09/17  4:03 AM  Result Value Ref Range   Heparin Unfractionated 0.65 0.30 - 0.70 IU/mL    Comment:        IF HEPARIN RESULTS ARE BELOW EXPECTED VALUES, AND PATIENT DOSAGE HAS BEEN CONFIRMED, SUGGEST FOLLOW UP TESTING OF ANTITHROMBIN III LEVELS.   Glucose, capillary     Status: Abnormal   Collection Time: 12/09/17  6:37 AM  Result Value Ref Range   Glucose-Capillary 57 (L) 65 - 99 mg/dL  Glucose, capillary     Status: Abnormal   Collection Time: 12/09/17  7:00 AM  Result Value Ref Range   Glucose-Capillary 62 (L) 65 - 99 mg/dL  Glucose, capillary     Status: None   Collection Time: 12/09/17 12:02 PM  Result Value Ref Range   Glucose-Capillary 95 65 - 99 mg/dL   Comment 1 Notify RN    Comment 2 Document in Chart    No results found.     Medical Problem List  and Plan: 1.  Decreased functional mobility secondary to diffuse large B-cell non-Hodgkin's lymphoma.Rituximab weekly initiated 12/04/2017. Plan [facility] with infusional doxorubicin 12/11/2017 and rituximab 12/21 with follow-up per oncology services  -admit to inpatient rehab 2.  DVT Prophylaxis/Anticoagulation: Chronic Coumadin. Monitor for any bleeding episodes 3. Pain Management with ? Brachial plexus impingement due to lymphadenopathy  - Neurontin 300 mg 3 times a day, oxycodone as needed 4. Mood: Provide emotional support 5. Neuropsych: This patient is capable of making decisions on her own behalf. 6. Skin/Wound Care: Routine skin checks 7. Fluids/Electrolytes/Nutrition: Routine I&O's with follow-up chemistries 8. Atrial fibrillation. Continue Tambocor 100 mg twice a day, Lopressor 12.5 mg twice a day 9. Hypertension. Lisinopril 20 mg daily. Monitor with increased mobility 10. Diastolic congestive heart failure. Monitor for any signs of fluid overload. Weigh patient  daily 11. Hypothyroidism. Synthroid 12. Gout. Allopurinol 300 mg daily. Monitor for any gout flareups 13. Constipation. Laxative assistance 14. RUE lymphedema: see #3, continue elevation, massage, edema mgt,   -integrate arm in to physical activity as possible  Post Admission Physician Evaluation: 1. Functional deficits secondary  to debility due to NHL. 2. Patient is admitted to receive collaborative, interdisciplinary care between the physiatrist, rehab nursing staff, and therapy team. 3. Patient's level of medical complexity and substantial therapy needs in context of that medical necessity cannot be provided at a lesser intensity of care such as a SNF. 4. Patient has experienced substantial functional loss from his/her baseline which was documented above under the "Functional History" and "Functional Status" headings.  Judging by the patient's diagnosis, physical exam, and functional history, the patient has potential for  functional progress which will result in measurable gains while on inpatient rehab.  These gains will be of substantial and practical use upon discharge  in facilitating mobility and self-care at the household level. 5. Physiatrist will provide 24 hour management of medical needs as well as oversight of the therapy plan/treatment and provide guidance as appropriate regarding the interaction of the two. 6. The Preadmission Screening has been reviewed and patient status is unchanged unless otherwise stated above. 7. 24 hour rehab nursing will assist with bladder management, bowel management, safety, skin/wound care, disease management, medication administration, pain management and patient education  and help integrate therapy concepts, techniques,education, etc. 8. PT will assess and treat for/with: Lower extremity strength, range of motion, stamina, balance, functional mobility, safety, adaptive techniques and equipment, NMR, pain control, family ed.   Goals are: supervision. 9. OT will assess and treat for/with: ADL's, functional mobility, safety, upper extremity strength, adaptive techniques and equipment, lymphedema mgt, NMR, family ed, community reentry.   Goals are: supervision to min assist. Therapy may proceed with showering this patient. 10. SLP will assess and treat for/with: n/a.  Goals are: n/a. 11. Case Management and Social Worker will assess and treat for psychological issues and discharge planning. 12. Team conference will be held weekly to assess progress toward goals and to determine barriers to discharge. 13. Patient will receive at least 3 hours of therapy per day at least 5 days per week. 14. ELOS: 8-13 days       15. Prognosis:  excellent     Meredith Staggers, MD, [facility] 12/09/2017  Lavon Paganini Chase, PA-C 12/09/2017

## 2017-12-10 ENCOUNTER — Other Ambulatory Visit: Payer: Self-pay | Admitting: Oncology

## 2017-12-10 ENCOUNTER — Inpatient Hospital Stay (HOSPITAL_COMMUNITY): Payer: Medicare Other | Admitting: Physical Therapy

## 2017-12-10 ENCOUNTER — Inpatient Hospital Stay (HOSPITAL_COMMUNITY): Payer: Medicare Other | Admitting: Occupational Therapy

## 2017-12-10 ENCOUNTER — Other Ambulatory Visit: Payer: Self-pay

## 2017-12-10 ENCOUNTER — Inpatient Hospital Stay (HOSPITAL_COMMUNITY): Payer: Medicare Other

## 2017-12-10 DIAGNOSIS — R591 Generalized enlarged lymph nodes: Secondary | ICD-10-CM

## 2017-12-10 DIAGNOSIS — G540 Brachial plexus disorders: Secondary | ICD-10-CM

## 2017-12-10 DIAGNOSIS — I480 Paroxysmal atrial fibrillation: Secondary | ICD-10-CM

## 2017-12-10 DIAGNOSIS — E46 Unspecified protein-calorie malnutrition: Secondary | ICD-10-CM

## 2017-12-10 DIAGNOSIS — I10 Essential (primary) hypertension: Secondary | ICD-10-CM

## 2017-12-10 DIAGNOSIS — D62 Acute posthemorrhagic anemia: Secondary | ICD-10-CM

## 2017-12-10 DIAGNOSIS — E8809 Other disorders of plasma-protein metabolism, not elsewhere classified: Secondary | ICD-10-CM

## 2017-12-10 DIAGNOSIS — I890 Lymphedema, not elsewhere classified: Secondary | ICD-10-CM

## 2017-12-10 DIAGNOSIS — C8330 Diffuse large B-cell lymphoma, unspecified site: Secondary | ICD-10-CM

## 2017-12-10 LAB — CBC WITH DIFFERENTIAL/PLATELET
BASOS ABS: 0 10*3/uL (ref 0.0–0.1)
Basophils Relative: 0 %
EOS ABS: 0.2 10*3/uL (ref 0.0–0.7)
EOS PCT: 2 %
HCT: 33.7 % — ABNORMAL LOW (ref 36.0–46.0)
Hemoglobin: 10.9 g/dL — ABNORMAL LOW (ref 12.0–15.0)
Lymphocytes Relative: 10 %
Lymphs Abs: 0.8 10*3/uL (ref 0.7–4.0)
MCH: 33.9 pg (ref 26.0–34.0)
MCHC: 32.3 g/dL (ref 30.0–36.0)
MCV: 104.7 fL — ABNORMAL HIGH (ref 78.0–100.0)
Monocytes Absolute: 0.8 10*3/uL (ref 0.1–1.0)
Monocytes Relative: 10 %
Neutro Abs: 6.1 10*3/uL (ref 1.7–7.7)
Neutrophils Relative %: 78 %
PLATELETS: 187 10*3/uL (ref 150–400)
RBC: 3.22 MIL/uL — AB (ref 3.87–5.11)
RDW: 15.5 % (ref 11.5–15.5)
WBC: 8 10*3/uL (ref 4.0–10.5)

## 2017-12-10 LAB — COMPREHENSIVE METABOLIC PANEL
ALT: 68 U/L — AB (ref 14–54)
AST: 63 U/L — AB (ref 15–41)
Albumin: 2.4 g/dL — ABNORMAL LOW (ref 3.5–5.0)
Alkaline Phosphatase: 82 U/L (ref 38–126)
Anion gap: 11 (ref 5–15)
BUN: 11 mg/dL (ref 6–20)
CHLORIDE: 102 mmol/L (ref 101–111)
CO2: 23 mmol/L (ref 22–32)
CREATININE: 0.81 mg/dL (ref 0.44–1.00)
Calcium: 11.5 mg/dL — ABNORMAL HIGH (ref 8.9–10.3)
GFR calc non Af Amer: >60 mL/min (ref >60)
Glucose, Bld: 58 mg/dL — ABNORMAL LOW (ref 65–99)
Potassium: 4.1 mmol/L (ref 3.5–5.1)
SODIUM: 136 mmol/L (ref 135–145)
Total Bilirubin: 0.6 mg/dL (ref 0.3–1.2)
Total Protein: 4.7 g/dL — ABNORMAL LOW (ref 6.5–8.1)

## 2017-12-10 LAB — PROTIME-INR
INR: 2.26
PROTHROMBIN TIME: 24.8 s — AB (ref 11.4–15.2)

## 2017-12-10 LAB — HEPARIN LEVEL (UNFRACTIONATED): Heparin Unfractionated: 0.41 IU/mL (ref 0.30–0.70)

## 2017-12-10 MED ORDER — SODIUM CHLORIDE 0.9 % IV SOLN
60.0000 mg | Freq: Once | INTRAVENOUS | Status: AC
  Administered 2017-12-10: 60 mg via INTRAVENOUS
  Filled 2017-12-10: qty 20

## 2017-12-10 MED ORDER — RITUXIMAB CHEMO INJECTION 500 MG/50ML
375.0000 mg/m2 | Freq: Once | INTRAVENOUS | Status: AC
  Administered 2017-12-11: 800 mg via INTRAVENOUS
  Filled 2017-12-10: qty 80

## 2017-12-10 MED ORDER — ALBUTEROL SULFATE (2.5 MG/3ML) 0.083% IN NEBU
2.5000 mg | INHALATION_SOLUTION | Freq: Once | RESPIRATORY_TRACT | Status: AC | PRN
  Administered 2017-12-15: 2.5 mg via RESPIRATORY_TRACT
  Filled 2017-12-10 (×2): qty 3

## 2017-12-10 MED ORDER — EPINEPHRINE PF 1 MG/ML IJ SOLN: 0.5000 mg | Freq: Once | INTRAMUSCULAR | Status: DC | PRN

## 2017-12-10 MED ORDER — DIPHENHYDRAMINE HCL 25 MG PO CAPS
25.0000 mg | ORAL_CAPSULE | Freq: Once | ORAL | Status: AC
  Administered 2017-12-11: 25 mg via ORAL
  Filled 2017-12-10: qty 1

## 2017-12-10 MED ORDER — ALTEPLASE 2 MG IJ SOLR
2.0000 mg | Freq: Once | INTRAMUSCULAR | Status: DC | PRN
  Filled 2017-12-10: qty 2

## 2017-12-10 MED ORDER — EPINEPHRINE PF 1 MG/10ML IJ SOSY: 0.2500 mg | PREFILLED_SYRINGE | Freq: Once | INTRAMUSCULAR | Status: DC | PRN

## 2017-12-10 MED ORDER — DIPHENHYDRAMINE HCL 50 MG/ML IJ SOLN: 25.0000 mg | Freq: Once | INTRAMUSCULAR | Status: DC | PRN

## 2017-12-10 MED ORDER — ACETAMINOPHEN 325 MG PO TABS
650.0000 mg | ORAL_TABLET | Freq: Once | ORAL | Status: AC
  Administered 2017-12-11: 650 mg via ORAL
  Filled 2017-12-10: qty 2

## 2017-12-10 MED ORDER — EPINEPHRINE PF 1 MG/ML IJ SOLN
0.5000 mg | Freq: Once | INTRAMUSCULAR | Status: DC | PRN
Start: 2017-12-11 — End: 2017-12-24

## 2017-12-10 MED ORDER — SODIUM CHLORIDE 0.9% FLUSH
10.0000 mL | INTRAVENOUS | Status: DC | PRN
  Administered 2017-12-11: 10 mL
  Filled 2017-12-10: qty 40

## 2017-12-10 MED ORDER — SODIUM CHLORIDE 0.9% FLUSH
10.0000 mL | INTRAVENOUS | Status: DC | PRN
Start: 2017-12-10 — End: 2017-12-16

## 2017-12-10 MED ORDER — SODIUM CHLORIDE 0.9 % IV SOLN
20.0000 mg | Freq: Once | INTRAVENOUS | Status: AC
  Administered 2017-12-11: 20 mg via INTRAVENOUS
  Filled 2017-12-10: qty 2

## 2017-12-10 MED ORDER — FAMOTIDINE IN NACL 20-0.9 MG/50ML-% IV SOLN
20.0000 mg | Freq: Once | INTRAVENOUS | Status: DC | PRN
  Filled 2017-12-10: qty 50

## 2017-12-10 MED ORDER — LIDOCAINE HCL 2 % EX GEL
CUTANEOUS | Status: DC | PRN
  Filled 2017-12-10: qty 5

## 2017-12-10 MED ORDER — METHYLPREDNISOLONE SODIUM SUCC 125 MG IJ SOLR: 125.0000 mg | Freq: Once | INTRAMUSCULAR | Status: DC | PRN

## 2017-12-10 MED ORDER — SODIUM CHLORIDE 0.9 % IV SOLN
Freq: Once | INTRAVENOUS | Status: AC
  Administered 2017-12-11: 11:00:00 via INTRAVENOUS

## 2017-12-10 MED ORDER — VALACYCLOVIR HCL 500 MG PO TABS
500.0000 mg | ORAL_TABLET | Freq: Three times a day (TID) | ORAL | Status: DC
  Administered 2017-12-10 – 2017-12-17 (×22): 500 mg via ORAL
  Filled 2017-12-10 (×27): qty 1

## 2017-12-10 MED ORDER — DIPHENHYDRAMINE HCL 50 MG/ML IJ SOLN: 50.0000 mg | Freq: Once | INTRAMUSCULAR | Status: DC | PRN

## 2017-12-10 MED ORDER — SODIUM CHLORIDE 0.9% FLUSH
10.0000 mL | Freq: Two times a day (BID) | INTRAVENOUS | Status: DC
  Administered 2017-12-10: 10 mL
  Administered 2017-12-12 – 2017-12-15 (×2): 20 mL
  Administered 2017-12-16: 10 mL

## 2017-12-10 MED ORDER — EPINEPHRINE PF 1 MG/10ML IJ SOSY
0.2500 mg | PREFILLED_SYRINGE | Freq: Once | INTRAMUSCULAR | Status: DC | PRN
  Filled 2017-12-10: qty 10

## 2017-12-10 MED ORDER — WARFARIN SODIUM 5 MG PO TABS
5.0000 mg | ORAL_TABLET | Freq: Once | ORAL | Status: AC
  Administered 2017-12-10: 5 mg via ORAL
  Filled 2017-12-10: qty 1

## 2017-12-10 MED ORDER — HEPARIN SOD (PORK) LOCK FLUSH 100 UNIT/ML IV SOLN
250.0000 [IU] | Freq: Once | INTRAVENOUS | Status: DC | PRN
  Filled 2017-12-10: qty 3

## 2017-12-10 MED ORDER — SODIUM CHLORIDE 0.9% FLUSH: 10.0000 mL | INTRAVENOUS | Status: DC | PRN

## 2017-12-10 MED ORDER — PRO-STAT SUGAR FREE PO LIQD
30.0000 mL | Freq: Two times a day (BID) | ORAL | Status: DC
  Administered 2017-12-10 – 2017-12-27 (×31): 30 mL via ORAL
  Filled 2017-12-10 (×33): qty 30

## 2017-12-10 MED ORDER — SODIUM CHLORIDE 0.9% FLUSH: 3.0000 mL | INTRAVENOUS | Status: DC | PRN

## 2017-12-10 MED ORDER — SODIUM CHLORIDE 0.9 % IV SOLN: Freq: Once | INTRAVENOUS | Status: DC | PRN

## 2017-12-10 MED ORDER — GERHARDT'S BUTT CREAM
TOPICAL_CREAM | Freq: Two times a day (BID) | CUTANEOUS | Status: DC
  Administered 2017-12-10: 1 via TOPICAL
  Administered 2017-12-10 – 2017-12-13 (×6): via TOPICAL
  Administered 2017-12-13: 1 via TOPICAL
  Administered 2017-12-14 – 2017-12-18 (×9): via TOPICAL
  Administered 2017-12-19: 1 via TOPICAL
  Administered 2017-12-19: 09:00:00 via TOPICAL
  Administered 2017-12-20: 1 via TOPICAL
  Administered 2017-12-21 – 2017-12-26 (×10): via TOPICAL
  Administered 2017-12-27: 1 via TOPICAL
  Administered 2017-12-27: 11:00:00 via TOPICAL
  Filled 2017-12-10 (×3): qty 1

## 2017-12-10 MED ORDER — HEPARIN SOD (PORK) LOCK FLUSH 100 UNIT/ML IV SOLN
500.0000 [IU] | Freq: Once | INTRAVENOUS | Status: DC | PRN
  Filled 2017-12-10: qty 5

## 2017-12-10 NOTE — Progress Notes (Signed)
Patient information reviewed and entered into eRehab system by Telsa Dillavou, RN, CRRN, PPS Coordinator.  Information including medical coding and functional independence measure will be reviewed and updated through discharge.     Per nursing patient was given "Data Collection Information Summary for Patients in Inpatient Rehabilitation Facilities with attached "Privacy Act Statement-Health Care Records" upon admission.  

## 2017-12-10 NOTE — Progress Notes (Signed)
Colleen [name]   DOB:[birth year]   WG#:[id number]   VHQ#:[id number]  Subjective: Colleen [name] is now in [place] and very hopeful of getting full use of her R arm, which would allow her to do all her ADLs. She is very motivated. -- After her first rituximab dose (and 5 days of prednisone) the adenopathy diminished and the pain in the R arm resolved, though the weakness persists. No fever, rash, bleeding, or other systemic concerns. Has not had her echo yet. No family in room  Objective: Elderly white woman examined in bed Vitals:   12/09/17 2055 12/10/17 0230  BP: 136/61 122/61  Pulse:  64  Resp:  16  Temp:  97.9 F (36.6 C)  SpO2:  97%    Body mass index is 33.68 kg/m.  Intake/Output Summary (Last 24 hours) at 12/10/2017 0838 Last data filed at 12/10/2017 0228 Gross per 24 hour  Intake 250 ml  Output 425 ml  Net -175 ml    CBG (last 3)  Recent Labs    12/09/17 0700 12/09/17 1202 12/09/17 1644  GLUCAP 62* 95 83     Labs:  Lab Results  Component Value Date   WBC 8.0 12/10/2017   HGB 10.9 (L) 12/10/2017   HCT 33.7 (L) 12/10/2017   MCV 104.7 (H) 12/10/2017   PLT 187 12/10/2017   NEUTROABS 6.1 12/10/2017    @LASTCHEMISTRY @  Urine Studies No results for input(s): UHGB, CRYS in the last 72 hours.  Invalid input(s): UACOL, UAPR, USPG, UPH, UTP, UGL, UKET, UBIL, UNIT, UROB, [place], UEPI, UWBC, Duwayne Heck [place], [place]  Basic Metabolic Panel: Recent Labs  Lab 12/06/17 0500 12/07/17 0540 12/08/17 0247 12/09/17 0403 12/10/17 0616  NA 140 139 140 140 136  K 3.6 3.2* 4.1 4.1 4.1  CL 104 110 108 108 102  CO2 26 23 24 24 23   GLUCOSE 70 422* 91 85 58*  BUN 14 14 10 11 11   CREATININE 0.69 0.57 0.61 0.61 0.81  CALCIUM 10.9*  10.9* 9.2 10.4* 10.7* 11.5*  MG 1.5*  --   --   --   --   PHOS 2.5  --   --   --   --    GFR Estimated Creatinine Clearance: 59.8 mL/min (by C-G formula based on SCr of 0.81 mg/dL). Liver Function Tests: Recent Labs  Lab 12/05/17 0248  12/06/17 0500 12/07/17 0540 12/08/17 0247 12/10/17 0616  AST 44* 42* 35 64* 63*  ALT 25 27 26  53 68*  ALKPHOS 62 66 50 68 82  BILITOT 0.4 0.3 0.2* 0.5 0.6  PROT 4.6* 4.9* 4.2* 4.7* 4.7*  ALBUMIN 2.4* 2.4* 2.2* 2.4* 2.4*   No results for input(s): LIPASE, AMYLASE in the last 168 hours. No results for input(s): AMMONIA in the last 168 hours. Coagulation profile Recent Labs  Lab 12/06/17 0500 12/07/17 0540 12/08/17 0247 12/09/17 0403 12/10/17 0616  INR 1.44 1.64 1.81 2.14 2.26    CBC: Recent Labs  Lab 12/06/17 0500 12/07/17 0540 12/08/17 0247 12/09/17 0403 12/10/17 0616  WBC 4.0 4.2 5.1 5.0 8.0  NEUTROABS 3.2  --   --   --  6.1  HGB 10.5* 9.5* 10.2* 10.4* 10.9*  HCT 31.7* 28.1* 30.9* 31.6* 33.7*  MCV 102.6* 102.6* 102.0* 102.6* 104.7*  PLT 181 163 185 176 187   Cardiac Enzymes: No results for input(s): CKTOTAL, CKMB, CKMBINDEX, TROPONINI in the last 168 hours. BNP: Invalid input(s): POCBNP CBG: Recent Labs  Lab 12/08/17 2033 12/09/17 4132 12/09/17 0700 12/09/17  1202 12/09/17 1644  GLUCAP 124* 57* 62* 95 83   D-Dimer No results for input(s): DDIMER in the last 72 hours. Hgb A1c No results for input(s): HGBA1C in the last 72 hours. Lipid Profile No results for input(s): CHOL, HDL, LDLCALC, TRIG, CHOLHDL, LDLDIRECT in the last 72 hours. Thyroid function studies No results for input(s): TSH, T4TOTAL, T3FREE, THYROIDAB in the last 72 hours.  Invalid input(s): FREET3 Anemia work up No results for input(s): VITAMINB12, FOLATE, FERRITIN, TIBC, IRON, RETICCTPCT in the last 72 hours. Microbiology No results found for this or any previous visit (from the past 240 hour(s)).    Studies:  Ct Soft Tissue Neck Wo Contrast  Result Date: 11/29/2017 CLINICAL DATA:  82 y/o F; patient undergoing evaluation for lymphadenopathy with biopsy in the left subclavian area straightening, now presenting with swelling of the left arm and chest with associated severe pain.  EXAM: CT NECK WITHOUT CONTRAST TECHNIQUE: Multidetector CT imaging of the neck was performed following the standard protocol without intravenous contrast. COMPARISON:  11/24/2017 CT of the neck. FINDINGS: Pharynx and larynx: Normal. No mass or swelling. Salivary glands: No inflammation, mass, or stone. Thyroid: Normal. Lymph nodes: Interval progression of diffuse lymphadenopathy, for example a left level 5B lymph node measures 12 x 12 mm, previously 6 mm (series 3, image 88). Increased lymphadenopathy is best appreciated left posterior cervical chain and left upper axilla but increase in adenopathy is present diffusely, for example a right upper peritracheal node measuring 24 x 31 mm, previously 17 x 23 mm (series 3, image 121). Fat stranding surrounding conglomerate lymph nodes throughout the right cervical chain, right greater than left supraclavicular regions, and left axilla may represent infiltrative neoplasm or lymphedema. Vascular: Calcific atherosclerosis of the aorta and carotid siphons. Limited intracranial: Negative. Visualized orbits: Negative. Mastoids and visualized paranasal sinuses: Small left maxillary sinus mucous retention cyst. Otherwise negative. Skeleton: Stable mild cervical spondylosis. No high-grade bony canal stenosis. Upper chest: Emphysema and scarring in bilateral upper lobes is stable. Other: None. IMPRESSION: Interval progression of lymphadenopathy from 11/24/2017 best appreciated in the left cervical level 5B station, but measurable increase is present diffusely. Findings indicate rapidly progressive lymphoproliferative or metastatic disease. Correlation with biopsy is recommended. Electronically Signed   By: Kristine Garbe M.D.   On: 11/29/2017 22:54   Ct Soft Tissue Neck W Contrast  Result Date: 11/24/2017 CLINICAL DATA:  RIGHT neck and ear pain, enlarging RIGHT supraclavicular and retroauricular mass for 2 weeks. LEFT facial swelling. History of carotid artery  surgery. EXAM: CT NECK WITH CONTRAST TECHNIQUE: Multidetector CT imaging of the neck was performed using the standard protocol following the bolus administration of intravenous contrast. CONTRAST:  36mL ISOVUE-300 IOPAMIDOL (ISOVUE-300) INJECTION 61% COMPARISON:  CT chest September 11, 2015 FINDINGS: PHARYNX AND LARYNX: Normal.  Widely patent airway. SALIVARY GLANDS: Normal. THYROID: Normal. LYMPH NODES: Multilevel severe RIGHT cervical lymphadenopathy. 27 mm RIGHT level 1 B lymph node, round RIGHT level IIa 23 mm lymph node, 3.1 cm RIGHT supraclavicular lymph node with pericapsular fat stranding RIGHT neck lymphadenopathy. Mild LEFT neck lymphadenopathy including 11 mm intraparotid lymph nodes and 14 mm LEFT level IIa lymph node. VASCULAR: Normal. LIMITED INTRACRANIAL: Normal. VISUALIZED ORBITS: Surgical clips LEFT neck most compatible with endarterectomy. Severe calcific atherosclerosis RIGHT carotid bifurcation. Lymphadenopathy narrows RIGHT internal jugular vein which remains patent. MASTOIDS AND VISUALIZED PARANASAL SINUSES: Well-aerated. SKELETON: Nonacute. No destructive bony lesions. Patient is edentulous. UPPER CHEST: RIGHT apical bullous changes and scarring. Partially imaged bilateral hilar lymphadenopathy. Mediastinal  lymphadenopathy including 2 cm aortopulmonary window lymph node, 17 mm pretracheal lymph node. LEFT greater than RIGHT axillary lymphadenopathy, at least 4 cm nodal conglomeration with LEFT retropectoral infiltrative mass. Heart size is probably enlarged. Severe coronary artery calcifications. OTHER: IMPRESSION: 1. Severe RIGHT and mild LEFT neck lymphadenopathy. Severe LEFT axillary lymphadenopathy with lymph edema versus lymphangitic spread of tumor. LEFT retropectoral infiltrative mass. Mediastinal lymphadenopathy. Constellation of findings seen with lymphoproliferative disease, metastatic disease such as breast cancer given LEFT chest findings. Recommend CT chest with contrast versus  PET-CT. 2. These results will be called to the ordering clinician or representative by the Radiologist Assistant, and communication documented in the PACS or zVision Dashboard. Aortic Atherosclerosis (ICD10-I70.0). Electronically Signed   By: Elon Alas M.D.   On: 11/24/2017 20:19   Ct Chest W Contrast  Result Date: 11/29/2017 CLINICAL DATA:  Left-sided chest pain and swelling, recent biopsy on right supraclavicular lymph nodes EXAM: CT CHEST WITH CONTRAST TECHNIQUE: Multidetector CT imaging of the chest was performed during intravenous contrast administration. CONTRAST:  67mL ISOVUE-300 IOPAMIDOL (ISOVUE-300) INJECTION 61% COMPARISON:  11/24/2017 FINDINGS: Cardiovascular: Atherosclerotic changes of the thoracic aorta are noted without aneurysmal dilatation or dissection. Coronary calcifications are seen. No pulmonary emboli are noted. Some attenuation of the pulmonary arterial branches is noted secondary to hilar adenopathy. Additionally some displacement of the vasculature in the neck is noted particularly on the right also related to adenopathy. Mediastinum/Nodes: Thoracic inlet demonstrates evidence of significant right supraclavicular lymphadenopathy. The largest nodal mass measures approximately 10 by 4.9 cm and causes displacement of the adjacent vascular structures in the right neck. Additional adenopathy is noted in the anterior neck similar to that seen on prior examination. Significant mediastinal and hilar adenopathy is identified. The largest of these in the right hilum measures 2.3 cm in short axis. Subcarinal lymphadenopathy is noted measuring 16 mm in short axis. Prevascular adenopathy is noted measuring 2.2 cm in short axis. The esophagus is within normal limits. Considerable adenopathy is noted in the posterior mediastinum surrounding the descending aorta. The nodal mass measures approximately 2.9 by 3.9 cm. Considerable right axillary adenopathy is noted extending along the lateral  chest wall. Lungs/Pleura: Emphysematous changes are noted in the lungs bilaterally. Mild scarring is seen. No focal parenchymal mass is noted. No sizable effusion is seen. Upper Abdomen: Gallbladder has been surgically removed. The liver is within normal limits. The adrenal glands and visualized spleen are unremarkable. Musculoskeletal: T9 compression deformity is noted. Some lucency is noted within. It would be difficult to exclude some metastatic involvement given the findings throughout the chest. Degenerative changes of the thoracic spine are seen. No rib abnormality is noted. Considerable soft tissue mass is noted in the left anterior chest wall involving the pectoral muscles and subpectoral region. The overall size of the mass measures approximately 10.5 by 6.7 cm and Ing gallstone the subclavian and axillary artery is and veins. Significant skin thickening and edema is noted within the left breast. It would be difficult to exclude a portion of this representing a breast mass. There is apparent involvement of the chest wall although no bony destruction is seen. IMPRESSION: Diffuse lymphadenopathy involving the supraclavicular regions, mediastinum, bilateral hila as well as the axillary regions bilaterally as described above. Correlation with the recent biopsy results is recommended. It would be difficult to exclude a portion of the abnormality in the left chest wall to be related to the overlying breast given the degree of skin thickening and edema identified inferiorly. Alternatively this  may represent an aggressive lymphoma. Further workup with PET-CT is recommended. T9 compression deformity with some lucencies noted within. This may be strictly related to the compression deformity although the possibility of metastatic disease would deserve consideration as well. No other definitive bony abnormality is noted. Aortic Atherosclerosis (ICD10-I70.0) and Emphysema (ICD10-J43.9). Electronically Signed   By: Inez Catalina M.D.   On: 11/29/2017 20:01   Mr Neck Soft Tissue Only W Wo Contrast  Result Date: 11/30/2017 CLINICAL DATA:  Rapidly progressive lymphadenopathy, RIGHT arm swelling since Thanksgiving. Suspected lymphoma. Status post biopsy, results pending. EXAM: MRI OF THE NECK WITH CONTRAST TECHNIQUE: Multiplanar, multisequence MR imaging was performed following the administration of intravenous contrast. CONTRAST:  36mL MULTIHANCE GADOBENATE DIMEGLUMINE 529 MG/ML IV SOLN COMPARISON:  CT neck November 29, 2017 and CT neck November 24, 2017 FINDINGS: Moderately motion degraded examination. PHARYNX AND LARYNX: Normal.  Widely patent airway. SALIVARY GLANDS: Intraparotid lymph nodes bilaterally measuring to 17 mm in deep lobe. No definite primary parotid mass though limited by motion. THYROID: Normal. LYMPH NODES: Severe RIGHT greater than LEFT cervical lymphadenopathy with intermediate to bright T2 signal and, mild enhancement. Lymphadenopathy all levels of the neck, at least 8.4 x 4.2 cm RIGHT supraclavicular nodal conglomeration. Lymphadenopathy abuts the scalene muscles impinges upon the RIGHT brachials plexus. Limited assessment of the brachium plexus due to motion. Massive LEFT retropectoral lymphadenopathy, lesser extent on the RIGHT. VASCULAR: Normal flow-voids. LIMITED INTRACRANIAL: Normal. VISUALIZED ORBITS: Normal. MASTOIDS AND VISUALIZED PARANASAL SINUSES: Well-aerated. SKELETON: No abnormal bone marrow signal though not tailored for evaluation. Limited assessment of spinal cord due to motion, no suspicious intracanalicular enhancement. UPPER CHEST: Lung apices are clear. No superior mediastinal lymphadenopathy. OTHER: Susceptibility artifact LEFT neck corresponding to use surgical clips. IMPRESSION: 1. Moderately motion degraded examination. 2. Bulky cervical and included chest lymphadenopathy with RIGHT brachial plexus impingement. Electronically Signed   By: Elon Alas M.D.   On: 11/30/2017 22:16    Dg Chest Portable 1 View  Result Date: 11/29/2017 CLINICAL DATA:  Abnormal lymphadenopathy in the neck, chest swelling, initial encounter EXAM: PORTABLE CHEST 1 VIEW COMPARISON:  11/24/2017 FINDINGS: Cardiac shadow is within normal limits. Soft tissue mass lesion is again noted superimposed over the left hilum similar to that seen on recent CT of the neck. Fullness in the hilar regions is noted bilaterally likely representing some adenopathy. Aortic calcifications are seen. The lungs are clear. No bony abnormality is noted. IMPRESSION: Changes consistent with the known left upper lobe mass lesion as well as apparent hilar adenopathy particularly on the right. CT of the chest is recommended for further evaluation as previously described. Electronically Signed   By: Inez Catalina M.D.   On: 11/29/2017 19:10   [place] Core Biopsy (lymph Nodes)  Result Date: 12/02/2017 INDICATION: 82 year old female with presumed lymphoma. She presents for biopsy of nodal mass of the left axilla. EXAM: ULTRASOUND-GUIDED LYMPH NODE BIOPSY MEDICATIONS: None. ANESTHESIA/SEDATION: Moderate (conscious) sedation was employed during this procedure. A total of Versed 2.0 mg and Fentanyl 50 mcg was administered intravenously. Moderate Sedation Time: 10 minutes. The patient's level of consciousness and vital signs were monitored continuously by radiology nursing throughout the procedure under my direct supervision. FLUOROSCOPY TIME:  None COMPLICATIONS: None PROCEDURE: Informed written consent was obtained from the patient after a thorough discussion of the procedural risks, benefits and alternatives. All questions were addressed. Maximal Sterile Barrier Technique was utilized including caps, mask, sterile gowns, sterile gloves, sterile drape, hand hygiene and skin antiseptic. A timeout  was performed prior to the initiation of the procedure. Patient positioned supine position on the ultrasound stretcher. Ultrasound images of the left  axillary region were performed with images stored and sent to PACs. The patient is prepped and draped in the usual sterile fashion. The skin and subcutaneous tissues were generously infiltrated 1% lidocaine for local anesthesia. Using ultrasound guidance, at least 8 separate 18 gauge core biopsy of the left axillary node were performed. Specimen placed in the saline. Final image was stored. Patient tolerated the procedure well and remained hemodynamically stable throughout. No complications were encountered and no significant blood loss. IMPRESSION: Status post ultrasound-guided biopsy of left axillary mass. Tissue specimen sent to pathology for complete histopathologic analysis. Signed, Dulcy Fanny. Earleen Newport, DO [facility] Radiology Electronically Signed   By: Corrie Mckusick D.O.   On: 12/02/2017 15:51   [place] Core Biopsy (lymph Nodes)  Result Date: 11/28/2017 INDICATION: 82 year old female with a clinical history of rapidly progressive right submental, cervical and supraclavicular lymphadenopathy. She presents today for urgent ultrasound-guided core biopsy. EXAM: ULTRASOUND GUIDED core needle BIOPSY OF right supraclavicular lymph node MEDICATIONS: None. ANESTHESIA/SEDATION: Fentanyl 100 mcg IV; Versed 2 mg IV Moderate Sedation Time:  15 minutes The patient was continuously monitored during the procedure by the interventional radiology nurse under my direct supervision. PROCEDURE: The procedure, risks, benefits, and alternatives were explained to the patient. Questions regarding the procedure were encouraged and answered. The patient understands and consents to the procedure. The right submental region, the right cervical chain in the right supraclavicular lymph nodes or all interrogated with ultrasound. There are numerous enlarged, hypoechoic an abnormal appearing lymph nodes in each station. The most solid appearing lymph nodes are present in the right supraclavicular  station. The right submental lymph node is slightly more cystic and may not yield viable cells. Therefore, the decision was made to proceed with biopsy of the right supraclavicular station lymph nodes. The right supraclavicular region was prepped with chlorhexidine in a sterile fashion, and a sterile drape was applied covering the operative field. A sterile gown and sterile gloves were used for the procedure. Local anesthesia was provided with 1% Lidocaine. A small dermatotomy was made. Under real-time sonographic guidance, numerous 18 gauge core biopsies were obtained of several lymph nodes in the right supraclavicular station. Biopsy specimens were placed in saline and delivered to pathology for further analysis. Post biopsy imaging demonstrates no evidence of hematoma or active bleeding. The patient tolerated the procedure well. There was no significant bleeding. COMPLICATIONS: None immediate. FINDINGS: Extensive hypoechoic lymphadenopathy throughout the right neck and right submental region. IMPRESSION: Technically successful ultrasound-guided core biopsy of right supraclavicular lymph nodes. Electronically Signed   By: Jacqulynn Cadet M.D.   On: 11/28/2017 09:04    Assessment: 82 y.o. [place] woman with a diffuse large cell B-cell non-Hodgkin's lymphoma, high-grade, diagnosed by lymph node biopsy December 2018  (1) prednisone started 12/03/2017, continued for 5 days  (2) rituximab weekly started 12/04/2017  (3) to receiveCHOP with infusional doxorubicin via PICC as outpatient  (4) no evidence of tumor lysis-- continue allopurinol  (5) hypercalcemia--related to NHL; PTH related peptide still pending--will give single dose of pamidronate     Plan: Colleen [name] has had a favorable response to initial therapy for her NHL. Once she is discharged we will proceed to curative therapy with CHOP. While in the hospital we will continue rituximab weekly and (is she is still in the hospital 12/28) a second  round of prednisone.  I discussed with her RN that rituximab is not chemotherapy but a form of immunotherapy. The patient did very well with her first cycle last week and should do equally well tomorrow. I don't see why she could not participate in her excercises while receiving the drug--it is a long infusion.  Her calcium is trending up. I think it would be prudent to give her a dose of pamidronate before the level begins to affect her functional status and ability to participate in Rehab--will write order for today.  Will follow closely with you.  Chauncey Cruel, MD 12/10/2017  8:38 AM Medical Oncology and Hematology [facility] [address] [place], [place] [address] Tel. [phone]    Fax. [phone]

## 2017-12-10 NOTE — Progress Notes (Signed)
PHYSICAL MEDICINE & REHABILITATION     PROGRESS NOTE  Subjective/Complaints:  Patient seen lying in bed this morning. She states she slept well overnight. She states she is ready to begin therapies today.  ROS: + Right upper extremity pain. Denies CP, SOB, nausea, vomiting, diarrhea.  Objective: Vital Signs: Blood pressure 122/61, pulse 64, temperature 97.9 F (36.6 C), temperature source Oral, resp. rate 16, height 5\' 4"  (1.626 m), weight 89 kg (196 lb 3.4 oz), SpO2 97 %. No results found. Recent Labs    12/09/17 0403 12/10/17 0616  WBC 5.0 8.0  HGB 10.4* 10.9*  HCT 31.6* 33.7*  PLT 176 187   Recent Labs    12/09/17 0403 12/10/17 0616  NA 140 136  K 4.1 4.1  CL 108 102  GLUCOSE 85 58*  BUN 11 11  CREATININE 0.61 0.81  CALCIUM 10.7* 11.5*   CBG (last 3)  Recent Labs    12/09/17 0700 12/09/17 1202 12/09/17 1644  GLUCAP 62* 95 83    Wt Readings from Last 3 Encounters:  12/10/17 89 kg (196 lb 3.4 oz)  12/08/17 88 kg (194 lb 0.1 oz)  11/28/17 73 kg (161 lb)    Physical Exam:  BP 122/61 (BP Location: Right Leg)   Pulse 64   Temp 97.9 F (36.6 C) (Oral)   Resp 16   Ht 5\' 4"  (1.626 m)   Wt 89 kg (196 lb 3.4 oz)   SpO2 97%   BMI 33.68 kg/m  Constitutional: NAD. Obese  HENT: Normocephalic. Atraumatic Eyes: EOM are normal. No discharge.  Cardiovascular: Regular rate and rhythm. No JVD.  Respiratory: No respiratory distress. Clear to auscultation  GI: Bowel sounds are normal. She exhibits no distension.  Musculoskeletal: Edema: RUE, limb tender to palpation Neurological: She isalertand oriented. Motor: RUE: Hand grip 3+/5, shoulder/elbow limited by pain.  LUE: 5/5 proximal to distal B/L LE:HF 4-/5, KE 4/5, ADF/PF 4+/5.  Skin. Warm and dry Psychiatric: She has a normal mood and affect. Her behavior is normal. Judgment and thought content normal.   Assessment/Plan: 1. Functional deficits secondary to diffuse large B-cell non-Hodgkin's  lymphoma which require 3+ hours per day of interdisciplinary therapy in a comprehensive inpatient rehab setting. Physiatrist is providing close team supervision and 24 hour management of active medical problems listed below. Physiatrist and rehab team continue to assess barriers to discharge/monitor patient progress toward functional and medical goals.  Function:  Bathing Bathing position      Bathing parts      Bathing assist        Upper Body Dressing/Undressing Upper body dressing                    Upper body assist        Lower Body Dressing/Undressing Lower body dressing                                  Lower body assist        Toileting Toileting          Toileting assist     Transfers Chair/bed transfer             Locomotion Ambulation           Wheelchair          Cognition Comprehension    Expression    Social Interaction    Problem Solving  Memory       Medical Problem List and Plan: 1.  Decreased functional mobility secondary to diffuse large B-cell non-Hodgkin's lymphoma.Rituximab weekly initiated 12/04/2017. Plan CHOP with infusional doxorubicin 12/11/2017 and rituximab 12/21 with follow-up per oncology services   Begin CIR 2.  DVT Prophylaxis/Anticoagulation:  Monitor for any bleeding episodes   Heparin bridge to Coumadin 3. Pain Management with brachial plexus impingement due to lymphadenopathy             Neurontin 300 mg 3 times a day, oxycodone as needed 4. Mood: Provide emotional support 5. Neuropsych: This patient is capable of making decisions on her own behalf. 6. Skin/Wound Care: Routine skin checks 7. Fluids/Electrolytes/Nutrition: Routine I&O's    BMP within acceptable range on 12/19 8. Atrial fibrillation. Continue Tambocor 100 mg twice a day, Lopressor 12.5 mg twice a day   Controlled on 12/19 9. Hypertension. Lisinopril 20 mg daily. Monitor with increased mobility   Monitor with increased  mobility 10. Diastolic congestive heart failure. Monitor for any signs of fluid overload. Weigh patient daily Filed Weights   12/09/17 1830 12/10/17 0230  Weight: 88.9 kg (196 lb) 89 kg (196 lb 3.4 oz)  11. Hypothyroidism. Synthroid 12. Gout. Allopurinol 300 mg daily. Monitor for any gout flareups 13. Constipation. Laxative assistance 14. RUE lymphedema: see #3, continue elevation, massage, edema mgt 15. Hypoalbuminemia   Supplement initiated on 12/19 16. Acute blood loss anemia   Hemoglobin 10.9 on 12/19   Continue to monitor  LOS (Days) 1 A FACE TO FACE EVALUATION WAS PERFORMED  Roselyne Stalnaker Lorie Phenix 12/10/2017 8:36 AM

## 2017-12-10 NOTE — Plan of Care (Signed)
  Not Progressing RH BLADDER ELIMINATION RH STG MANAGE BLADDER WITH ASSISTANCE Description STG Manage Bladder With min Assistance  12/10/2017 1432 - Not Progressing by Marney Setting, RN Note Urinary retention with small bouts of incontinent urine. PVR's greater than 350  RH STG MANAGE BLADDER WITH EQUIPMENT WITH ASSISTANCE Description STG Manage Bladder With Equipment With min  Assistance  12/10/2017 1432 - Not Progressing by Marney Setting, RN RH SKIN INTEGRITY RH STG MAINTAIN SKIN INTEGRITY WITH ASSISTANCE Description STG Maintain Skin Integrity With min  Assistance.  12/10/2017 1432 - Not Progressing by Marney Setting, RN Note Requiring total assist for skin management due to difficult location of skin breakdown  RH STG ABLE TO PERFORM INCISION/WOUND CARE W/ASSISTANCE Description STG Able To Perform Incision/Wound Care With  Mod Assistance.  12/10/2017 1432 - Not Progressing by Marney Setting, RN

## 2017-12-10 NOTE — H&P (Signed)
Physical Medicine and Rehabilitation Admission H&P    No chief complaint on file. : HPI: Colleen [name] is a 82 y.o. right handed female with history of atrial fibrillation with chronic Coumadin, diastolic congestive heart failure, hypertension, TIA and remote tobacco abuse. History taken from chart review and patient. Per report patient lives with spouse and son. Independent prior to admission. Multilevel home bath and bedroom upstairs. Spouse with limited physical assistance and spouse and son work during the day. Presented 11/29/2017 with complaints of right arm pain and swelling as well as recent reports of a mass in her right neck. CT of the chest showed significant adenopathy bilaterally. MRI soft tissue of the neck showed bulky cervical and included chest lymphadenopathy with right brachial plexus impingement. Lymph node biopsy 12/02/2017 showed likely diffuse large B-cell non-Hodgkin's lymphoma. Hematology consulted/Dr. Magrinat placed on prednisone/allopurinol as well as Weekly rituximab initiated 12/04/2017. Plan is to receive CHOP with infusional doxorubicin 12/20 per hematology/oncology services Chronic Coumadin has been resumed. Physical and occupational therapy evaluations completed with recommendations of physical medicine rehabilitation consult.Patient was admitted for a comprehensive rehabilitation program    Review of Systems  Constitutional: Positive for malaise/fatigue. Negative for fever.  HENT: Negative for hearing loss.   Eyes: Negative for blurred vision and double vision.  Respiratory: Positive for shortness of breath. Negative for cough.   Cardiovascular: Positive for palpitations and leg swelling. Negative for chest pain.  Gastrointestinal: Positive for constipation. Negative for nausea and vomiting.  Genitourinary: Negative for dysuria, flank pain and hematuria.  Musculoskeletal: Positive for joint pain and myalgias.  Skin: Negative for rash.  Neurological:  Positive for weakness. Negative for seizures.  All other systems reviewed and are negative.  Past Medical History:  Diagnosis Date  . Atrial fibrillation ([place])   . Carotid artery disease ([place])   . CHF (congestive heart failure) ([place])   . Hyperlipidemia   . Hypertension   . Hypothyroidism   . Shortness of breath   . TIA (transient ischemic attack) 2001   Past Surgical History:  Procedure Laterality Date  . ABDOMINAL HYSTERECTOMY  1975   TAH,BSO  . CARDIAC CATHETERIZATION N/A 08/14/2015   Procedure: Right Heart Cath;  Surgeon: Larey Dresser, MD;  Location: [facility];  Service: Cardiovascular;  Laterality: N/A;  . carotid duplex  11/24/2012  . carotid duplex  11/01/2010  . CAROTID ENDARTERECTOMY  2001   left  . CHOLECYSTECTOMY    . DOPPLER ECHOCARDIOGRAPHY  02/13/2010   EF>55%  . DOPPLER ECHOCARDIOGRAPHY  09/29/2007  . NM MYOVIEW LTD  02/13/2010  . renal duplex  02/13/2010  . VIDEO BRONCHOSCOPY Bilateral 08/09/2015   Procedure: VIDEO BRONCHOSCOPY WITHOUT FLUORO;  Surgeon: Collene Gobble, MD;  Location: [facility];  Service: Cardiopulmonary;  Laterality: Bilateral;   Family History  Problem Relation Age of Onset  . Heart failure Mother   . Parkinson's disease Mother   . Arthritis Mother   . Arthritis Sister   . Colon cancer Brother   . Rheumatologic disease Neg Hx   . Lung disease Neg Hx    Social History:  reports that she quit smoking about 17 years ago. Her smoking use included cigarettes. She has a 47.00 pack-year smoking history. she has never used smokeless tobacco. She reports that she drinks alcohol. She reports that she does not use drugs. Allergies: No Known Allergies Medications Prior to Admission  Medication Sig Dispense Refill  . acetaminophen (TYLENOL) 500 MG tablet Take 1,000 mg  by mouth every 6 (six) hours as needed for headache (pain).    Marland Kitchen allopurinol (ZYLOPRIM) 300 MG tablet Take 1 tablet (300 mg total) by mouth daily.    Marland Kitchen Dextrose-Sodium  Chloride (DEXTROSE 5 % AND 0.9% NACL) 5-0.9 % infusion Inject 75 mL/hr into the vein continuous. 250 mL   . flecainide (TAMBOCOR) 100 MG tablet Take 1 tablet (100 mg total) by mouth 2 (two) times daily.    Marland Kitchen gabapentin (NEURONTIN) 300 MG capsule Take 1 capsule (300 mg total) by mouth 3 (three) times daily.    Marland Kitchen levothyroxine (SYNTHROID, LEVOTHROID) 88 MCG tablet Take 88 mcg by mouth daily before breakfast.     . lisinopril (PRINIVIL,ZESTRIL) 20 MG tablet Take 1 tablet (20 mg total) by mouth 2 (two) times daily. (Patient taking differently: Take 20 mg by mouth daily. ) 60 tablet 6  . metoprolol tartrate (LOPRESSOR) 25 MG tablet Take 0.5 tablets (12.5 mg total) by mouth 2 (two) times daily.    Marland Kitchen oxyCODONE 10 MG TABS Take 1 tablet (10 mg total) by mouth every 4 (four) hours as needed for moderate pain. 30 tablet 0  . senna-docusate (SENOKOT-S) 8.6-50 MG tablet Take 1 tablet by mouth at bedtime.    Marland Kitchen warfarin (COUMADIN) 5 MG tablet Take 2.5-5 mg by mouth See admin instructions. Take 1 tablet (5 mg) by mouth on Monday and Friday after supper, take 1/2 tablet (2.5 mg) on Sunday, Tuesday, Wednesday, Thursday, Saturday with supper      Drug Regimen Review Drug regimen was reviewed and remains appropriate with no significant issues identified  Home: Home Living Family/patient expects to be discharged to:: Private residence Living Arrangements: Spouse/significant other   Functional History:    Functional Status:  Mobility:          ADL:    Cognition: Cognition Orientation Level: Oriented X4    Physical Exam: Blood pressure 122/61, pulse 64, temperature 97.9 F (36.6 C), temperature source Oral, resp. rate 16, height '5\' 4"'  (1.626 m), weight 89 kg (196 lb 3.4 oz), SpO2 97 %. Physical Exam  Vitals reviewed. Constitutional:  obese  HENT:  Head: Normocephalic.  Eyes: Conjunctivae and EOM are normal. Pupils are equal, round, and reactive to light.  Neck: Normal range of motion. Neck  supple. No tracheal deviation present. No thyromegaly present.  Cardiovascular: Regular rhythm and normal heart sounds. Exam reveals no friction rub.  Cardiac rate controlled  Respiratory: No respiratory distress. She has no wheezes.  Fair inspiratory effort clear to auscultation  GI: Soft. Bowel sounds are normal. She exhibits no distension.  Musculoskeletal: Edema: 3+ edema RUE, limb tender to palpation, no warmth or erythema.  Psychiatric: She has a normal mood and affect. Her behavior is normal. Judgment and thought content normal.   Skin. Warm and dry Musculoskeletal: She exhibits no tenderness.  LE edema  Neurological: She is alert and oriented to person, place, and time.  Motor: RUE: Hand grip 4-/5, shoulder/elbow limited by pain. ?1-2/5.  LUE: 5/5 proximal to distal B/L LE:HF 4-/5, KE 4/5, ADF/PF 4+/5. Sensation normal. DTR's 1+    Results for orders placed or performed during the hospital encounter of 11/29/17 (from the past 48 hour(s))  Glucose, capillary     Status: None   Collection Time: 12/08/17  7:52 AM  Result Value Ref Range   Glucose-Capillary 88 65 - 99 mg/dL  Glucose, capillary     Status: None   Collection Time: 12/08/17 11:48 AM  Result Value Ref Range  Glucose-Capillary 86 65 - 99 mg/dL  Heparin level (unfractionated)     Status: Abnormal   Collection Time: 12/08/17  1:12 PM  Result Value Ref Range   Heparin Unfractionated 1.07 (H) 0.30 - 0.70 IU/mL    Comment:        IF HEPARIN RESULTS ARE BELOW EXPECTED VALUES, AND PATIENT DOSAGE HAS BEEN CONFIRMED, SUGGEST FOLLOW UP TESTING OF ANTITHROMBIN III LEVELS.   Glucose, capillary     Status: Abnormal   Collection Time: 12/08/17  4:24 PM  Result Value Ref Range   Glucose-Capillary 135 (H) 65 - 99 mg/dL  Glucose, capillary     Status: Abnormal   Collection Time: 12/08/17  8:33 PM  Result Value Ref Range   Glucose-Capillary 124 (H) 65 - 99 mg/dL  Heparin level (unfractionated)     Status: None    Collection Time: 12/08/17 11:06 PM  Result Value Ref Range   Heparin Unfractionated 0.62 0.30 - 0.70 IU/mL    Comment:        IF HEPARIN RESULTS ARE BELOW EXPECTED VALUES, AND PATIENT DOSAGE HAS BEEN CONFIRMED, SUGGEST FOLLOW UP TESTING OF ANTITHROMBIN III LEVELS.   Protime-INR     Status: Abnormal   Collection Time: 12/09/17  4:03 AM  Result Value Ref Range   Prothrombin Time 23.7 (H) 11.4 - 15.2 seconds   INR 2.14   CBC     Status: Abnormal   Collection Time: 12/09/17  4:03 AM  Result Value Ref Range   WBC 5.0 4.0 - 10.5 K/uL   RBC 3.08 (L) 3.87 - 5.11 MIL/uL   Hemoglobin 10.4 (L) 12.0 - 15.0 g/dL   HCT 31.6 (L) 36.0 - 46.0 %   MCV 102.6 (H) 78.0 - 100.0 fL   MCH 33.8 26.0 - 34.0 pg   MCHC 32.9 30.0 - 36.0 g/dL   RDW 15.1 11.5 - 15.5 %   Platelets 176 150 - 400 K/uL  Basic metabolic panel     Status: Abnormal   Collection Time: 12/09/17  4:03 AM  Result Value Ref Range   Sodium 140 135 - 145 mmol/L   Potassium 4.1 3.5 - 5.1 mmol/L   Chloride 108 101 - 111 mmol/L   CO2 24 22 - 32 mmol/L   Glucose, Bld 85 65 - 99 mg/dL   BUN 11 6 - 20 mg/dL   Creatinine, Ser 0.61 0.44 - 1.00 mg/dL   Calcium 10.7 (H) 8.9 - 10.3 mg/dL   GFR calc non Af Amer >60 >60 mL/min   GFR calc Af Amer >60 >60 mL/min    Comment: (NOTE) The eGFR has been calculated using the CKD EPI equation. This calculation has not been validated in all clinical situations. eGFR's persistently <60 mL/min signify possible Chronic Kidney Disease.    Anion gap 8 5 - 15  Uric acid     Status: None   Collection Time: 12/09/17  4:03 AM  Result Value Ref Range   Uric Acid, Serum 2.3 2.3 - 6.6 mg/dL  Heparin level (unfractionated)     Status: None   Collection Time: 12/09/17  4:03 AM  Result Value Ref Range   Heparin Unfractionated 0.65 0.30 - 0.70 IU/mL    Comment:        IF HEPARIN RESULTS ARE BELOW EXPECTED VALUES, AND PATIENT DOSAGE HAS BEEN CONFIRMED, SUGGEST FOLLOW UP TESTING OF ANTITHROMBIN III LEVELS.    Glucose, capillary     Status: Abnormal   Collection Time: 12/09/17  6:37 AM  Result Value Ref Range   Glucose-Capillary 57 (L) 65 - 99 mg/dL  Glucose, capillary     Status: Abnormal   Collection Time: 12/09/17  7:00 AM  Result Value Ref Range   Glucose-Capillary 62 (L) 65 - 99 mg/dL  Glucose, capillary     Status: None   Collection Time: 12/09/17 12:02 PM  Result Value Ref Range   Glucose-Capillary 95 65 - 99 mg/dL   Comment 1 Notify RN    Comment 2 Document in Chart   Glucose, capillary     Status: None   Collection Time: 12/09/17  4:44 PM  Result Value Ref Range   Glucose-Capillary 83 65 - 99 mg/dL   No results found.     Medical Problem List and Plan: 1.  Decreased functional mobility secondary to diffuse large B-cell non-Hodgkin's lymphoma.Rituximab weekly initiated 12/04/2017. Plan CHOP with infusional doxorubicin 12/11/2017 and rituximab 12/21 with follow-up per oncology services  -admit to inpatient rehab 2.  DVT Prophylaxis/Anticoagulation: Chronic Coumadin. Monitor for any bleeding episodes 3. Pain Management with ? Brachial plexus impingement due to lymphadenopathy  - Neurontin 300 mg 3 times a day, oxycodone as needed 4. Mood: Provide emotional support 5. Neuropsych: This patient is capable of making decisions on her own behalf. 6. Skin/Wound Care: Routine skin checks 7. Fluids/Electrolytes/Nutrition: Routine I&O's with follow-up chemistries 8. Atrial fibrillation. Continue Tambocor 100 mg twice a day, Lopressor 12.5 mg twice a day 9. Hypertension. Lisinopril 20 mg daily. Monitor with increased mobility 10. Diastolic congestive heart failure. Monitor for any signs of fluid overload. Weigh patient daily 11. Hypothyroidism. Synthroid 12. Gout. Allopurinol 300 mg daily. Monitor for any gout flareups 13. Constipation. Laxative assistance 14. RUE lymphedema: see #3, continue elevation, massage, edema mgt,   -integrate arm in to physical activity as possible  Post  Admission Physician Evaluation: 1. Functional deficits secondary  to debility due to NHL. 2. Patient is admitted to receive collaborative, interdisciplinary care between the physiatrist, rehab nursing staff, and therapy team. 3. Patient's level of medical complexity and substantial therapy needs in context of that medical necessity cannot be provided at a lesser intensity of care such as a SNF. 4. Patient has experienced substantial functional loss from his/her baseline which was documented above under the "Functional History" and "Functional Status" headings.  Judging by the patient's diagnosis, physical exam, and functional history, the patient has potential for functional progress which will result in measurable gains while on inpatient rehab.  These gains will be of substantial and practical use upon discharge  in facilitating mobility and self-care at the household level. 5. Physiatrist will provide 24 hour management of medical needs as well as oversight of the therapy plan/treatment and provide guidance as appropriate regarding the interaction of the two. 6. The Preadmission Screening has been reviewed and patient status is unchanged unless otherwise stated above. 7. 24 hour rehab nursing will assist with bladder management, bowel management, safety, skin/wound care, disease management, medication administration, pain management and patient education  and help integrate therapy concepts, techniques,education, etc. 8. PT will assess and treat for/with: Lower extremity strength, range of motion, stamina, balance, functional mobility, safety, adaptive techniques and equipment, NMR, pain control, family ed.   Goals are: supervision. 9. OT will assess and treat for/with: ADL's, functional mobility, safety, upper extremity strength, adaptive techniques and equipment, lymphedema mgt, NMR, family ed, community reentry.   Goals are: supervision to min assist. Therapy may proceed with showering this  patient. 10. SLP will assess and  treat for/with: n/a.  Goals are: n/a. 11. Case Management and Social Worker will assess and treat for psychological issues and discharge planning. 12. Team conference will be held weekly to assess progress toward goals and to determine barriers to discharge. 13. Patient will receive at least 3 hours of therapy per day at least 5 days per week. 14. ELOS: 8-13 days       15. Prognosis:  excellent   This document/assessment was completed on 12/09/17 and was moved for the purpose of the encounter into the rehab chart.   Meredith Staggers, MD, [facility] 12/10/2017  Meredith Staggers, MD 12/10/2017   Lauraine Rinne, [facility]

## 2017-12-10 NOTE — Progress Notes (Signed)
[facility] for Warfarin Indication: atrial fibrillation  No Known Allergies  Patient Measurements: Height: 5\' 4"  (162.6 cm) Weight: 196 lb 3.4 oz (89 kg) IBW/kg (Calculated) : 54.7  Heparin dosing weight = 70.5 kg  Vital Signs: Temp: 97.8 F (36.6 C) (12/19 1300) Temp Source: Oral (12/19 1300) BP: 135/68 (12/19 1300) Pulse Rate: 62 (12/19 1300)  Labs: Recent Labs    12/08/17 0247  12/08/17 2306 12/09/17 0403 12/10/17 0616  HGB 10.2*  --   --  10.4* 10.9*  HCT 30.9*  --   --  31.6* 33.7*  PLT 185  --   --  176 187  LABPROT 20.8*  --   --  23.7* 24.8*  INR 1.81  --   --  2.14 2.26  HEPARINUNFRC 1.01*   < > 0.62 0.65 0.41  CREATININE 0.61  --   --  0.61 0.81   < > = values in this interval not displayed.   Estimated Creatinine Clearance: 59.8 mL/min (by C-G formula based on SCr of 0.81 mg/dL).  Assessment: 82 y/o female admitted with lymphadenopathy requiring biopsy. She is on chronic warfarin for history of afib but this was held for biopsy. Heparin initiated and warfarin was restarted 12/13. Heparin level remains therapeutic. INR with slow rise up to 2.26 today. CBC stable. No bleed documented.  Home coumadin dose: 2.5mg  daily except 5mg  MF  Goal of Therapy:  INR 2-3 Heparin level 0.3-0.7 units/ml Monitor platelets by anticoagulation protocol: Yes   Plan:  -Coumadin 5mg  PO x 1 - then likely back to home dose -D/C IV heparin -Daily INR -Monitor for s/sx bleeding  Maryanna Shape, PharmD, BCPS  Clinical Pharmacist  Pager: [phone]  12/10/2017 2:15 PM

## 2017-12-10 NOTE — Progress Notes (Signed)
Physical Therapy Session Note  Patient Details  Name: Colleen [name] MRN: [id number] Date of Birth: 1935/12/14  Today's Date: 12/10/2017 PT Individual Time: 2536-6440 PT Individual Time Calculation (min): 84 min   Short Term Goals: Week 1:  PT Short Term Goal 1 (Week 1): Pt will perform sit to stand with least restrictive device from w/c with cushion with max A x 1 person. PT Short Term Goal 2 (Week 1): Pt will transfer bed to chair with Max A x 1 person and least restictive device. PT Short Term Goal 3 (Week 1): Pt will ambulate 20 ft with RW and min A x 1.  Skilled Therapeutic Interventions/Progress Updates:  Pt supine in bed upon PT arrival, agreeable to therapy tx and reports 10/10 pain in R arm during movements, already received pain medications and relieved with rest. Pt transferred supine>sitting with max assist, verbal cues for techniques. Pt transferred from sitting>standing from elevated bed with mod assist using RW for UE support, pt able to remain standing for about 5 min. Pt performed sit>stand again but now requiring max assist secondary to fatigue. While standing with RW pt performed x 10 marches in place with each LE. This PT provided pt with higher seat to floor height chair and thicker cushion in order for pt to perform sit<>stand with less assist. Pt transferred from bed>w/c stand pivot using RW, requiring max assist to boost up to standing and then min assist for turning. Pt transported to gym in w/c total assist. While seated in w/c in gym pt performed 2 x 10 LAQ and 2 x 10 seated marches. Pt performed sit<>stand with max assist using RW, and able to ambulate about 4 ft taking very small steps and w/c follow for safety. Pt transported back to room. Pt performed transfer from w/c<>recliner using the stedy in order to assess for nursing use. Pt requiring +2 this time to stand secondary to fatigue and total assist for transfer with stedy. Pt left seated in recliner with needs in  reach.   Therapy Documentation Precautions:  Precautions Precautions: Fall, Other (comment) Precaution Comments: Chemo; BP only in LE; incontinent with mobility; R UE pain/lymphedema Restrictions Weight Bearing Restrictions: No General: Chart Reviewed: Yes Family/Caregiver Present: No   See Function Navigator for Current Functional Status.   Therapy/Group: Individual Therapy  Netta Corrigan, PT, DPT 12/10/2017, 11:59 AM

## 2017-12-10 NOTE — Evaluation (Signed)
Occupational Therapy Assessment and Plan  Patient Details  Name: Colleen [name] MRN: [id number] Date of Birth: [birth year]  OT Diagnosis: abnormal posture, acute pain, muscle weakness (generalized), other lymphedema and swelling of limb Rehab Potential: Rehab Potential (ACUTE ONLY): Good ELOS: 14-16 days   Today's Date: 12/10/2017 OT Individual Time: 3500-9381 OT Individual Time Calculation (min): 59 min     Problem List:  Patient Active Problem List   Diagnosis Date Noted  . Lymphedema   . Hypoalbuminemia due to protein-calorie malnutrition ([place])   . Debility 12/09/2017  . Right arm pain   . Brachial plexopathy   . Diffuse large B-cell lymphoma of lymph nodes of neck ([place])   . PAF (paroxysmal atrial fibrillation) ([place])   . Chronic diastolic congestive heart failure ([place])   . History of TIA (transient ischemic attack)   . Benign essential HTN   . Acute blood loss anemia   . Malignant lymphomas of lymph nodes of head, face, and neck ([place]) 12/03/2017  . Hypoxia 11/30/2017  . Lymphadenopathy   . Acute cystitis without hematuria   . Lactic acidosis   . Degenerative arthritis of left knee 08/14/2016  . Arthritis of left foot 08/14/2016  . Peripheral vascular disease ([place]) 08/14/2016  . Emphysema of lung ([place]) 10/19/2015  . Restrictive lung disease 10/19/2015  . Chronic respiratory failure ([place]) 09/04/2015  . Chronic diastolic CHF (congestive heart failure) ([place]) 08/25/2015  . Paroxysmal atrial fibrillation ([place]) 08/24/2015  . Pulmonary infiltrate   . Multiple lung nodules on CT 08/07/2015  . Acute kidney injury (nontraumatic) ([place])   . Acute diastolic heart failure ([place]) 07/30/2015  . Acute diastolic CHF (congestive heart failure) ([place]) 07/30/2015  . Acute respiratory failure with hypoxia ([place]) 07/30/2015  . AKI (acute kidney injury) ([place]) 07/30/2015  . Sinus bradycardia 07/30/2015  . Hyponatremia 07/30/2015  . Junctional bradycardia 07/28/2015  . Shortness of breath  07/28/2015  . Chest pain 02/15/2015  . Carotid artery disease ([place]) 07/15/2013  . Radiculopathy 06/19/2011  . LEG PAIN, LEFT 06/21/2010  . PAIN IN THORACIC SPINE 05/11/2010  . OSTEOPENIA 02/23/2009  . Acute low back pain 11/17/2007  . Hyperlipidemia 07/03/2007  . Essential hypertension 07/03/2007  . Hypothyroidism 06/10/2007    Past Medical History:  Past Medical History:  Diagnosis Date  . Atrial fibrillation ([place])   . Carotid artery disease ([place])   . CHF (congestive heart failure) ([place])   . Hyperlipidemia   . Hypertension   . Hypothyroidism   . Shortness of breath   . TIA (transient ischemic attack) 2001   Past Surgical History:  Past Surgical History:  Procedure Laterality Date  . ABDOMINAL HYSTERECTOMY  1975   TAH,BSO  . CARDIAC CATHETERIZATION N/A 08/14/2015   Procedure: Right Heart Cath;  Surgeon: Larey Dresser, MD;  Location: [facility];  Service: Cardiovascular;  Laterality: N/A;  . carotid duplex  11/24/2012  . carotid duplex  11/01/2010  . CAROTID ENDARTERECTOMY  2001   left  . CHOLECYSTECTOMY    . DOPPLER ECHOCARDIOGRAPHY  02/13/2010   EF>55%  . DOPPLER ECHOCARDIOGRAPHY  09/29/2007  . NM MYOVIEW LTD  02/13/2010  . renal duplex  02/13/2010  . VIDEO BRONCHOSCOPY Bilateral 08/09/2015   Procedure: VIDEO BRONCHOSCOPY WITHOUT FLUORO;  Surgeon: Collene Gobble, MD;  Location: [facility];  Service: Cardiopulmonary;  Laterality: Bilateral;    Assessment & Plan Clinical Impression: Patient is a 82 y.o. year old female with recent admission to the hospital on 12/08/2018withcomplaints of right  arm pain and swelling as well as recent reports of a mass in her right neck. CT of the chest showed significant adenopathy bilaterally. MRI soft tissue of the neck showed bulky cervical and included chest lymphadenopathy with right brachial plexus impingement. Lymph node biopsy 12/02/2017 showed likely diffuse large B-cell non-Hodgkin's lymphoma. Hematology consulted/Dr.  Magrinat placed on prednisone/allopurinol as well as [facility] 12/04/2017. Plan is to receive CHOP with infusional doxorubicin 12/20 per hematology/oncology services Chronic Coumadin has been resumed. Physical and occupational therapy evaluations completed with recommendations of physical medicine rehabilitation consult.Patient was admitted for a comprehensive rehabilitation program  .  Patient transferred to CIR on 12/09/2017 .    Patient currently requires max with basic self-care skills secondary to muscle weakness, decreased coordination and decreased sitting balance, decreased standing balance, decreased postural control and decreased balance strategies.  Prior to hospitalization, patient could complete ADLs and IADLs with independent .  Patient will benefit from skilled intervention to decrease level of assist with basic self-care skills and increase independence with basic self-care skills prior to discharge home with care partner.  Anticipate patient will require minimal physical assistance and follow up home health.  OT - End of Session Activity Tolerance: Tolerates 10 - 20 min activity with multiple rests Endurance Deficit: Yes Endurance Deficit Description: Fatigued quickly witihin 30 seconds of standing OT Assessment Rehab Potential (ACUTE ONLY): Good OT Barriers to Discharge: Decreased caregiver support OT Barriers to Discharge Comments: son and spouse both work,  pt will need 24 hour assist OT Patient demonstrates impairments in the following area(s): Balance;Edema;Endurance;Pain;Motor OT Basic ADL's Functional Problem(s): Grooming;Bathing;Dressing;Toileting OT Advanced ADL's Functional Problem(s): Simple Meal Preparation OT Transfers Functional Problem(s): Tub/Shower;Toilet OT Additional Impairment(s): Fuctional Use of Upper Extremity OT Plan OT Intensity: Minimum of 1-2 x/day, 45 to 90 minutes OT Frequency: 5 out of 7 days OT Duration/Estimated Length of Stay:  14-16 days OT Treatment/Interventions: Medical illustrator training;Community reintegration;Discharge planning;Pain management;Self Care/advanced ADL retraining;Therapeutic Activities;UE/LE Coordination activities;Therapeutic Exercise;Patient/family education;Functional mobility training;DME/adaptive equipment instruction;UE/LE Strength taining/ROM OT Self Feeding Anticipated Outcome(s): modified independent OT Basic Self-Care Anticipated Outcome(s): supervision to min assist OT Toileting Anticipated Outcome(s): min assist OT Bathroom Transfers Anticipated Outcome(s): min assist OT Recommendation Patient destination: Home Follow Up Recommendations: 24 hour supervision/assistance Equipment Recommended: 3 in 1 bedside comode;Tub/shower bench   Skilled Therapeutic Intervention Pt completed bathing and dressing sit to stand from the bedside chair.  Increased pain and decreased functional use of the RUE was noted with pt needing max hand over hand to use for washing the LUE.  She was unable to reach her feet with use of the LUE for bathing secondary to flexibility issues.  Max assist for sit to stand from the bedside recliner.  She was able to take 3-4 small steps forward and then backwards, but LEs began to buckle and pt was returned back to the bedside chair.  Finished session with RUE positioned on pillows and call button in reach.  Discussed anticipated need for min assist level goals and 24 hour supervision/min assist at discharge.    OT Evaluation Precautions/Restrictions  Precautions Precautions: Fall;Other (comment) Precaution Comments: Chemo; BP only in LE; incontinent with mobility; R UE pain/lymphedema Restrictions Weight Bearing Restrictions: No  Pain Pain Assessment Pain Assessment: Faces Faces Pain Scale: Hurts little more Pain Type: Chronic pain Pain Location: Arm Pain Orientation: Right Pain Onset: With Activity Pain Intervention(s): Repositioned Home Living/Prior  [place] expects to be discharged to:: Private residence Living Arrangements: Spouse/significant other Available Help at  Discharge: Available PRN/intermittently Type of Home: House Home Access: Stairs to enter Entrance Stairs-Number of Steps: 3  Home Layout: Multi-level, Bed/bath upstairs Alternate Level Stairs-Number of Steps: three steps to get into the house and then 5 more to get up to the bedrooms and bathrooms Bathroom Shower/Tub: Multimedia programmer: Standard  Lives With: Spouse, Son IADL History Homemaking Responsibilities: Yes Meal Prep Responsibility: Therapist, occupational Responsibility: Primary Cleaning Responsibility: Primary Shopping Responsibility: Primary Current License: Yes Mode of Transportation: Musician Occupation: Retired Prior Function Level of Independence: Independent with basic ADLs Vocation: Retired ADL  See Function Section of chart  Vision Baseline Vision/History: Wears glasses Wears Glasses: At all times(does not wear them for far vision, states she can see better without them) Patient Visual Report: No change from baseline Vision Assessment?: No apparent visual deficits Perception  Perception: Within Functional Limits Praxis Praxis: Intact Cognition Overall Cognitive Status: Within Functional Limits for tasks assessed Arousal/Alertness: Awake/alert Orientation Level: Person;Place;Situation Person: Oriented Place: Oriented Situation: Oriented Year: 2018 Month: December Day of Week: Correct Memory: Appears intact Immediate Memory Recall: Sock;Blue;Bed Memory Recall: Sock;Blue;Bed Memory Recall Sock: Without Cue Memory Recall Blue: Without Cue Memory Recall Bed: Without Cue Attention: Sustained Sustained Attention: Appears intact Awareness: Appears intact Problem Solving: Appears intact Safety/Judgment: Appears intact Sensation Sensation Light Touch: Appears Intact Stereognosis: Not  tested Hot/Cold: Not tested Additional Comments: Sensation intact in RUE and hand per gross testing and pt report Coordination Gross Motor Movements are Fluid and Coordinated: Yes Fine Motor Movements are Fluid and Coordinated: No Coordination and Movement Description: Pt unable to effectively use the RUE for selfcare tasks secondary to anteiror upper arm pain in the biceps region. Motor  Motor Motor: Other (comment) Motor - Skilled Clinical Observations: Pt with brachial plexus impairment in the RUE affecting functional use.  Mobility  Transfers Transfers: Sit to Stand;Stand to Sit Sit to Stand: 2: Max assist;With upper extremity assist;From chair/3-in-1 Stand to Sit: 3: Mod assist;With armrests;With upper extremity assist;To chair/3-in-1  Trunk/Postural Assessment  Cervical Assessment Cervical Assessment: Exceptions to WFL(cervical flexion and protraction at rest) Thoracic Assessment Thoracic Assessment: Exceptions to [facility] Thoracic AROM Overall Thoracic AROM Comments: Rounding of shoulders as well as right trunk shortening and lateral flexion Lumbar Assessment Lumbar Assessment: Exceptions to WFL(posterior pelvic tilt in sitting) Postural Control Postural Control: Deficits on evaluation Trunk Control: Pt with LOB to the left in sitting during bathing tasks.  Demonstrates forward and depressed shoulder on the right side as well.   Balance Balance Balance Assessed: Yes Static Sitting Balance Static Sitting - Balance Support: Feet supported;Left upper extremity supported Static Sitting - Level of Assistance: 5: Stand by assistance Dynamic Sitting Balance Dynamic Sitting - Balance Support: During functional activity Dynamic Sitting - Level of Assistance: 4: Min assist Static Standing Balance Static Standing - Balance Support: Bilateral upper extremity supported Static Standing - Level of Assistance: 3: Mod assist Dynamic Standing Balance Dynamic Standing - Balance Support:  During functional activity Dynamic Standing - Level of Assistance: 2: Max assist Extremity/Trunk Assessment RUE Assessment RUE Assessment: Exceptions to [facility] RUE AROM (degrees) RUE Overall AROM Comments: Pt with increased edema noted throughout the right arm and hand.  She was only able to tolerate approximately 0-60 degrees shoulder flexion and could not activate any motion against gravity during testing.  Elbow flexion/extension overall a 3-/5, finger flexion/extension 3+/5.  Pain with most PROM movements, especially more proximal movement. LUE Assessment LUE Assessment: Within Functional Limits(AROM WFLS strength 4/5 throughout.  )  See Function Navigator for Current Functional Status.   Refer to Care Plan for Long Term Goals  Recommendations for other services: Neuropsych   Discharge Criteria: Patient will be discharged from OT if patient refuses treatment 3 consecutive times without medical reason, if treatment goals not met, if there is a change in medical status, if patient makes no progress towards goals or if patient is discharged from hospital.  The above assessment, treatment plan, treatment alternatives and goals were discussed and mutually agreed upon: by patient  Janyia Guion  OTR/L 12/10/2017, 4:27 PM

## 2017-12-10 NOTE — Progress Notes (Signed)
PMR Admission Coordinator Pre-Admission Assessment  Patient: Colleen [name] is an 82 y.o., female MRN: [id number] DOB: 03-10-1935 Height: '5\' 4"'  (162.6 cm) Weight: 89 kg (196 lb 3.4 oz)              Insurance Information HMO: Yes    PPO:       PCP:       IPA:       80/20:       OTHER:  Group # [id number] PRIMARY: BCBS medicare      Policy#: [id number]      Subscriber: Macario Golds CM Name: Limmie Patricia      Phone#: [phone]     Fax#: [phone] Pre-Cert#:                                                    Employer: Retired Benefits:  Phone #: [phone]     Name:  On line Eff. Date: 12/23/16     Deduct:  $0      Out of Pocket Max: $5500 (met $777.90)      Life Max: N/A CIR: $300 days 1-6      SNF:  $0 days 1-20; $167.50 days 21-60; $0 days 61-100 Outpatient:  Medical necessity     Co-Pay: $40/visit Home Health: 100%      Co-Pay: none DME: 80%     Co-Pay: 20% Providers: in network  Emergency [place]    Name Relation Home Work Mobile   Trim,Mike Spouse [phone]  [phone]   Prudy Feeler   [phone]     Current Medical History  Patient Admitting Diagnosis: Diffuse large B-cell non-Hodgkin's lymphoma    History of Present Illness: An 82 y.o.right handed femalewith history of atrial fibrillation with chronic Coumadin, diastolic congestive heart failure, hypertension, TIA and remote tobacco abuse.History taken from chart review and patient.Per report patient lives with spouseand son. Independent prior to admission. Multilevel home bath and bedroom upstairs. Spouse with limited physical assistanceand spouse andson work during the day. Presented 12/08/2018withcomplaints of right arm pain and swelling as well as recent reports of a mass in her right neck. CT of the chest showed significant adenopathy bilaterally. MRI soft tissue of the neck showed bulky cervical and included chest lymphadenopathy with right brachial  plexus impingement. Lymph node biopsy 12/02/2017 showed likely diffuse large B-cell non-Hodgkin's lymphoma. Hematology consulted/Dr. Magrinat placed on prednisone/allopurinol as well as [facility] 12/04/2017. Plan is to receive CHOP with infusional doxorubicin 12/20 per hematology/oncology services Chronic Coumadin has been resumed. Physical and occupational therapy evaluations completed with recommendations of physical medicine rehabilitation consult.  Patient to be admitted for a comprehensive inpatient rehabilitation program.  Past Medical History  Past Medical History:  Diagnosis Date  . Atrial fibrillation ([place])   . Carotid artery disease ([place])   . CHF (congestive heart failure) ([place])   . Hyperlipidemia   . Hypertension   . Hypothyroidism   . Shortness of breath   . TIA (transient ischemic attack) 2001    Family History  family history includes Arthritis in her mother and sister; Colon cancer in her brother; Heart failure in her mother; Parkinson's disease in her mother.  Prior Rehab/Hospitalizations: No previous rehab.  Has the patient had major surgery during 100 days prior to admission? No  Current Medications   Current Facility-Administered Medications:  .  acetaminophen (TYLENOL) tablet 650 mg, 650 mg, Oral, Q6H PRN **OR** acetaminophen (TYLENOL) suppository 650 mg, 650 mg, Rectal, Q6H PRN, Angiulli, Lavon Paganini, PA-C .  allopurinol (ZYLOPRIM) tablet 300 mg, 300 mg, Oral, Daily, Angiulli, Lavon Paganini, PA-C, 300 mg at 12/10/17 0925 .  feeding supplement (PRO-STAT SUGAR FREE 64) liquid 30 mL, 30 mL, Oral, BID, Patel, Domenick Bookbinder, MD .  flecainide [facility] tablet 100 mg, 100 mg, Oral, BID, Angiulli, Lavon Paganini, PA-C, 100 mg at 12/10/17 0925 .  gabapentin (NEURONTIN) capsule 300 mg, 300 mg, Oral, TID, Angiulli, Lavon Paganini, PA-C, 300 mg at 12/10/17 0925 .  ipratropium-albuterol (DUONEB) 0.5-2.5 (3) MG/3ML nebulizer solution 3 mL, 3 mL, Nebulization, Q4H PRN, Angiulli, Lavon Paganini, PA-C .  levothyroxine (SYNTHROID, LEVOTHROID) tablet 88 mcg, 88 mcg, Oral, QAC breakfast, Cathlyn Parsons, PA-C, 88 mcg at 12/10/17 0610 .  lisinopril (PRINIVIL,ZESTRIL) tablet 20 mg, 20 mg, Oral, Daily, Angiulli, Lavon Paganini, PA-C, 20 mg at 12/10/17 0925 .  metoprolol tartrate (LOPRESSOR) tablet 12.5 mg, 12.5 mg, Oral, BID, Angiulli, Lavon Paganini, PA-C, 12.5 mg at 12/10/17 1191 .  ondansetron (ZOFRAN) tablet 4 mg, 4 mg, Oral, Q6H PRN **OR** ondansetron (ZOFRAN) injection 4 mg, 4 mg, Intravenous, Q6H PRN, Angiulli, Lavon Paganini, PA-C .  oxyCODONE (Oxy IR/ROXICODONE) immediate release tablet 10 mg, 10 mg, Oral, Q4H PRN, Angiulli, Lavon Paganini, PA-C, 10 mg at 12/10/17 0925 .  pamidronate (AREDIA) 60 mg in sodium chloride 0.9 % 500 mL IVPB, 60 mg, Intravenous, Once, Magrinat, Virgie Dad, MD .  polyethylene glycol (MIRALAX / GLYCOLAX) packet 17 g, 17 g, Oral, Daily, Angiulli, Lavon Paganini, PA-C .  senna-docusate (Senokot-S) tablet 1 tablet, 1 tablet, Oral, QHS, Angiulli, Lavon Paganini, PA-C, 1 tablet at 12/09/17 2055 .  sodium chloride flush (NS) 0.9 % injection 10-40 mL, 10-40 mL, Intracatheter, Q12H, Jamse Arn, MD, 10 mL at 12/10/17 0926 .  sodium chloride flush (NS) 0.9 % injection 10-40 mL, 10-40 mL, Intracatheter, PRN, Posey Pronto, Domenick Bookbinder, MD .  sodium chloride flush (NS) 0.9 % injection 10-40 mL, 10-40 mL, Intracatheter, PRN, Posey Pronto, Domenick Bookbinder, MD .  sorbitol 70 % solution 30 mL, 30 mL, Oral, Daily PRN, Angiulli, Lavon Paganini, PA-C .  Warfarin - Pharmacist Dosing Inpatient, , Does not apply, q1800, Angiulli, Lavon Paganini, PA-C  Patients Current Diet: Diet regular Room service appropriate? Yes; Fluid consistency: Thin  Precautions / Restrictions     Has the patient had 2 or more falls or a fall with injury in the past year?No  Prior Activity Level    Home Assistive Devices / Equipment Home Assistive Devices/Equipment: None  Prior Device Use: Indicate devices/aids used by the patient prior to current illness,  exacerbation or injury? None  Prior Functional Level    Self Care: Did the patient need help bathing, dressing, using the toilet or eating?  Independent  Indoor Mobility: Did the patient need assistance with walking from room to room (with or without device)? Independent  Stairs: Did the patient need assistance with internal or external stairs (with or without device)? Independent  Functional Cognition: Did the patient need help planning regular tasks such as shopping or remembering to take medications? Independent  Current Functional Level Cognition  Orientation Level: Oriented X4    Extremity Assessment (includes Sensation/Coordination)          ADLs       Mobility       Transfers       Ambulation / Gait / Stairs / Wheelchair  Mobility       Posture / Balance      Special needs/care consideration BiPAP/CPAP No CPM No Continuous Drip IV Heparin drip Dialysis No       Life Vest No Oxygen No Special Bed No Trach Size No Wound Vac (area) No      Skin No                            Bowel mgmt: Last BM 12/09/17 Bladder mgmt: External catheter Diabetic mgmt No    Previous Home Environment Living Arrangements: Spouse/significant other Home Care Services: No  Discharge Living Setting Does the patient have any problems obtaining your medications?: No  Social/Family/Support Systems    Goals/Additional Needs Patient/Family Goal for Rehab: PT/OT supervision to min assist goals Expected length of stay: 8-13 days Cultural Considerations: Mina Marble, attends church on Wednesday and Sundays Dietary Needs: Regular diet, thin liquids Equipment Needs: TBD Special Service Needs: Per Dr Jana Hakim, the plan is for chemo on 12/20 (CHOP) and 12/21 (rituximab. Pt/Family Agrees to Admission and willing to participate: Yes Program Orientation Provided & Reviewed with Pt/Caregiver Including Roles  & Responsibilities: Yes  Decrease burden of Care through IP rehab admission:  N/A  Possible need for SNF placement upon discharge: Not planned  Patient Condition: This patient's condition remains as documented in the consult dated 12/08/17, in which the Rehabilitation Physician determined and documented that the patient's condition is appropriate for intensive rehabilitative care in an inpatient rehabilitation facility pending:  Patient is now medically stable per attending MD.  Patient is tolerating up in chair and is participating with therapies. These areas have been addressed. Will admit to inpatient rehab today.  Preadmission Screen Completed By:  Retta Diones, 12/10/2017 9:57 AM ______________________________________________________________________   Discussed status with Dr. Naaman Plummer on 12/09/17 at 1535  and received telephone approval for admission today.  Admission Coordinator:  Retta Diones, time 1540/Date 12/09/17

## 2017-12-10 NOTE — Evaluation (Signed)
Physical Therapy Assessment and Plan  Patient Details  Name: Colleen [name] MRN: [id number] Date of Birth: [birth year]  PT Diagnosis: Abnormal posture, Abnormality of gait, Edema, Muscle weakness, Pain in joint and Pain in R UE and neck.   Rehab Potential: Fair ELOS: (10-12)   Today's Date: 12/10/2017 PT Individual Time: 9326-7124 PT Individual Time Calculation (min): 55 min    Problem List:  Patient Active Problem List   Diagnosis Date Noted  . Lymphedema   . Hypoalbuminemia due to protein-calorie malnutrition ([place])   . Debility 12/09/2017  . Right arm pain   . Brachial plexopathy   . Diffuse large B-cell lymphoma of lymph nodes of neck ([place])   . PAF (paroxysmal atrial fibrillation) ([place])   . Chronic diastolic congestive heart failure ([place])   . History of TIA (transient ischemic attack)   . Benign essential HTN   . Acute blood loss anemia   . Malignant lymphomas of lymph nodes of head, face, and neck ([place]) 12/03/2017  . Hypoxia 11/30/2017  . Lymphadenopathy   . Acute cystitis without hematuria   . Lactic acidosis   . Degenerative arthritis of left knee 08/14/2016  . Arthritis of left foot 08/14/2016  . Peripheral vascular disease ([place]) 08/14/2016  . Emphysema of lung ([place]) 10/19/2015  . Restrictive lung disease 10/19/2015  . Chronic respiratory failure ([place]) 09/04/2015  . Chronic diastolic CHF (congestive heart failure) ([place]) 08/25/2015  . Paroxysmal atrial fibrillation ([place]) 08/24/2015  . Pulmonary infiltrate   . Multiple lung nodules on CT 08/07/2015  . Acute kidney injury (nontraumatic) ([place])   . Acute diastolic heart failure ([place]) 07/30/2015  . Acute diastolic CHF (congestive heart failure) ([place]) 07/30/2015  . Acute respiratory failure with hypoxia ([place]) 07/30/2015  . AKI (acute kidney injury) ([place]) 07/30/2015  . Sinus bradycardia 07/30/2015  . Hyponatremia 07/30/2015  . Junctional bradycardia 07/28/2015  . Shortness of breath 07/28/2015  . Chest pain  02/15/2015  . Carotid artery disease ([place]) 07/15/2013  . Radiculopathy 06/19/2011  . LEG PAIN, LEFT 06/21/2010  . PAIN IN THORACIC SPINE 05/11/2010  . OSTEOPENIA 02/23/2009  . Acute low back pain 11/17/2007  . Hyperlipidemia 07/03/2007  . Essential hypertension 07/03/2007  . Hypothyroidism 06/10/2007    Past Medical History:  Past Medical History:  Diagnosis Date  . Atrial fibrillation ([place])   . Carotid artery disease ([place])   . CHF (congestive heart failure) ([place])   . Hyperlipidemia   . Hypertension   . Hypothyroidism   . Shortness of breath   . TIA (transient ischemic attack) 2001   Past Surgical History:  Past Surgical History:  Procedure Laterality Date  . ABDOMINAL HYSTERECTOMY  1975   TAH,BSO  . CARDIAC CATHETERIZATION N/A 08/14/2015   Procedure: Right Heart Cath;  Surgeon: Larey Dresser, MD;  Location: [facility];  Service: Cardiovascular;  Laterality: N/A;  . carotid duplex  11/24/2012  . carotid duplex  11/01/2010  . CAROTID ENDARTERECTOMY  2001   left  . CHOLECYSTECTOMY    . DOPPLER ECHOCARDIOGRAPHY  02/13/2010   EF>55%  . DOPPLER ECHOCARDIOGRAPHY  09/29/2007  . NM MYOVIEW LTD  02/13/2010  . renal duplex  02/13/2010  . VIDEO BRONCHOSCOPY Bilateral 08/09/2015   Procedure: VIDEO BRONCHOSCOPY WITHOUT FLUORO;  Surgeon: Collene Gobble, MD;  Location: [facility];  Service: Cardiopulmonary;  Laterality: Bilateral;    Assessment & Plan Clinical Impression: Patient is a 82 y.o. year old female with recent admission to the hospital on 11/29/17 with  history of atrial fibrillation with chronic Coumadin, diastolic congestive heart failure, hypertension, TIA and remote tobacco abuse.History taken from chart review and patient.Per report patient lives with spouseand son. Independent prior to admission. Multilevel home bath and bedroom upstairs. Spouse with limited physical assistanceand spouse andson work during the day. Presented 12/08/2018withcomplaints of right  arm pain and swelling as well as recent reports of a mass in her right neck. CT of the chest showed significant adenopathy bilaterally. MRI soft tissue of the neck showed bulky cervical and included chest lymphadenopathy with right brachial plexus impingement. Lymph node biopsy 12/02/2017 showed likely diffuse large B-cell non-Hodgkin's lymphoma. Hematology consulted/Dr. Magrinat placed on prednisone/allopurinol as well as [facility] 12/04/2017. Plan is to receive CHOP with infusional doxorubicin 12/20 per hematology/oncology services Chronic Coumadin has been resumed. Physical and occupational therapy evaluations completed with recommendations of physical medicine rehabilitation consult.Patient was admitted for a comprehensive rehabilitation program   .  Patient transferred to CIR on 12/09/2017 .   Patient currently requires total with mobility secondary to muscle weakness, decreased cardiorespiratoy endurance and decreased sitting balance, decreased standing balance, decreased postural control and decreased balance strategies.  Prior to hospitalization, patient was independent  with mobility and lived with Spouse, Son in a House home.  Home access is   .  Patient will benefit from skilled PT intervention to maximize safe functional mobility, minimize fall risk and decrease caregiver burden for planned discharge home with 24 hour assist.  Anticipate patient will require 24 hour assist  at discharge.  PT - End of Session Activity Tolerance: Tolerates 10 - 20 min activity with multiple rests Endurance Deficit: Yes Endurance Deficit Description: Unable to stand for wiping bottom and then ambulate due to fatigue. PT Assessment Rehab Potential (ACUTE/IP ONLY): Fair PT Barriers to Discharge: Inaccessible home environment;Decreased caregiver support;Home environment access/layout;Incontinence;Lack of/limited family support;Pending chemo/radiation PT Patient demonstrates impairments in the  following area(s): Balance;Edema;Endurance;Pain;Safety PT Transfers Functional Problem(s): Bed Mobility;Bed to Chair;Car;Furniture PT Locomotion Functional Problem(s): Ambulation;Wheelchair Mobility;Stairs PT Plan PT Intensity: Minimum of 1-2 x/day ,45 to 90 minutes PT Frequency: 5 out of 7 days PT Duration Estimated Length of Stay: (10-12) PT Treatment/Interventions: Ambulation/gait training;Balance/vestibular training;Discharge planning;Disease management/prevention;DME/adaptive equipment instruction;Functional mobility training;Pain management;Therapeutic Activities;Therapeutic Exercise;Stair training;Patient/family education;Psychosocial support;Wheelchair propulsion/positioning PT Transfers Anticipated Outcome(s): Min A PT Locomotion Anticipated Outcome(s): Min A PT Recommendation Recommendations for Other Services: Therapeutic Recreation consult Therapeutic Recreation Interventions: Kitchen group;Stress management Follow Up Recommendations: 24 hour supervision/assistance Patient destination: [place] [facility] (SNF)(Home vs SNF due to dependence in mobility, house inaccessibility and husband/son work) Equipment Recommended: To be determined  Skilled Therapeutic Intervention Session initiated with pt reporting that she needed to use the restroom.  Session focused on improving independence with functional mobility in order to decrease burden of care.  Pt transferred with +2 assist to bedside commode and required max A for peri care.  Pt fatigued quickly with standing to perform peri care and transferred to w/c using RW (x approx 5 steps).  Upon attempting to stand again pt had episode of fecal incontinence, pt was unable with +2 assist so obtained another nurse and +3 transfer of stand pivot back to bed performed.  Pt reported 6/10 pain in neck at start of session, nursing notified and to issue pain meds at end of session.  Pt left in bed following session with call bell in reach and  needs met.  PT Evaluation Precautions/Restrictions Precautions Precautions: Fall;Other (comment) Precaution Comments: Chemo; BP only in LE; incontinent with mobility; R UE  pain/lymphedema Restrictions Weight Bearing Restrictions: No General Chart Reviewed: Yes Family/Caregiver Present: No Vital Signs     Home Living/Prior Functioning Home Living Available Help at Discharge: Available PRN/intermittently(Husband and son both still work) Type of Home: House Home Layout: Multi-level;Bed/bath upstairs  Lives With: Spouse;Son Prior Function Level of Independence: Independent with gait;Independent with transfers;Independent with homemaking with ambulation;Independent with basic ADLs Vocation: Retired     Associate Professor Overall Cognitive Status: Within Functional Limits for tasks assessed Arousal/Alertness: Awake/alert Orientation Level: Oriented X4 Memory: Appears intact Awareness: Appears intact Problem Solving: Appears intact Safety/Judgment: Appears intact Sensation Sensation Light Touch: Appears Intact Coordination Gross Motor Movements are Fluid and Coordinated: No Coordination and Movement Description: Unable to use R UE due to pain and swelling.  LE has difficulty using for functional mobility as well due to reports of swelling Motor  Motor Motor - Skilled Clinical Observations: (R UE weakness; No increase tone or hemiparesis)     Locomotion  Ambulation Ambulation: Yes Ambulation/Gait Assistance: 4: Min guard Ambulation Distance (Feet): (5 ft) Assistive device: Rolling walker  Trunk/Postural Assessment  Cervical Assessment Cervical Assessment: Exceptions to [name] Cervical AROM Overall Cervical AROM Comments: Forward head posture Thoracic Assessment Thoracic Assessment: Exceptions to [facility] Thoracic AROM Overall Thoracic AROM Comments: Increased kyphosis Lumbar Assessment Lumbar Assessment: (Increased post pelvic tilt) Postural Control Postural Control: (Leans right  in sitting; requires v/c to prop with L UE)  Balance Balance Balance Assessed: Yes Static Sitting Balance Static Sitting - Balance Support: Left upper extremity supported Static Standing Balance Static Standing - Balance Support: Bilateral upper extremity supported(via walker; ) Extremity Assessment  RUE Assessment RUE Assessment: Exceptions to [facility] RUE AROM (degrees) RUE Overall AROM Comments: Pt only able to move R wrist into extension and extend fingers.  Otherwise, no active movement in R UE; Due to pain, pt could not tolerate any assessment of R UE PROM. LUE Assessment LUE Assessment: (Pt able to use L UE functionally for mobility ) RLE Assessment RLE Assessment: Exceptions to WFL(Pt requires assist to lift both LE back to bed and requires assistance for mobility of BLE in supine) RLE Strength RLE Overall Strength Comments: Pt requires assist to lift both LE back to bed and requires assistance for mobility of BLE in supine. LLE Assessment LLE Assessment: Exceptions to [facility] LLE Strength LLE Overall Strength Comments: Pt requires assist to lift both LE back to bed and requires assistance for mobility of BLE in supine   See Function Navigator for Current Functional Status.   Refer to Care Plan for Long Term Goals  Recommendations for other services: Therapeutic Recreation  Kitchen group and Stress management  Discharge Criteria: Patient will be discharged from PT if patient refuses treatment 3 consecutive times without medical reason, if treatment goals not met, if there is a change in medical status, if patient makes no progress towards goals or if patient is discharged from hospital.  The above assessment, treatment plan, treatment alternatives and goals were discussed and mutually agreed upon: by patient  Lakena Sparlin Hilario Quarry 12/10/2017, 11:48 AM

## 2017-12-10 NOTE — Consult Note (Signed)
Physical Medicine and Rehabilitation Consult Reason for Consult: Decreased functional mobility Referring Physician: [facility]   HPI: Colleen [name] is a 82 y.o. right handed female with history of atrial fibrillation with chronic Coumadin, diastolic congestive heart failure, hypertension, TIA and remote tobacco abuse. History taken from chart review and patient. Per report patient lives with spouse and son. Independent prior to admission. Multilevel home bath and bedroom upstairs. Spouse with limited physical assistance and spouse and son work during the day. Presented 11/29/2017 with complaints of right arm pain and swelling as well as recent reports of a mass in her right neck. CT of the chest showed significant adenopathy bilaterally. MRI soft tissue of the neck showed bulky cervical and included chest lymphadenopathy with right brachial plexus impingement. Lymph node biopsy 12/02/2017 showed likely diffuse large B-cell non-Hodgkin's lymphoma. Hematology consulted placed on prednisone/allopurinol. Weekly rituximab initiated 12/04/2017. Follow-up closely with hematology oncology plan was to start bendamustine or CHOP with infusional adrimycin within the next week. Chronic Coumadin has been resumed. Physical and occupational therapy evaluations completed with recommendations of physical medicine rehabilitation consult   Review of Systems  Constitutional: Positive for malaise/fatigue. Negative for chills and fever.  HENT: Negative for hearing loss.   Eyes: Negative for blurred vision and double vision.  Respiratory: Positive for shortness of breath.   Gastrointestinal: Positive for constipation. Negative for nausea and vomiting.  Genitourinary: Negative for dysuria, flank pain and hematuria.  Musculoskeletal: Positive for joint pain and myalgias.  Skin: Negative for rash.  Neurological: Positive for weakness. Negative for seizures.  All other systems reviewed and are negative.  Past Medical  History:  Diagnosis Date  . Atrial fibrillation ([place])   . Carotid artery disease ([place])   . CHF (congestive heart failure) ([place])   . Hyperlipidemia   . Hypertension   . Hypothyroidism   . Shortness of breath   . TIA (transient ischemic attack) 2001   Past Surgical History:  Procedure Laterality Date  . ABDOMINAL HYSTERECTOMY  1975   TAH,BSO  . CARDIAC CATHETERIZATION N/A 08/14/2015   Procedure: Right Heart Cath;  Surgeon: Larey Dresser, MD;  Location: [facility];  Service: Cardiovascular;  Laterality: N/A;  . carotid duplex  11/24/2012  . carotid duplex  11/01/2010  . CAROTID ENDARTERECTOMY  2001   left  . CHOLECYSTECTOMY    . DOPPLER ECHOCARDIOGRAPHY  02/13/2010   EF>55%  . DOPPLER ECHOCARDIOGRAPHY  09/29/2007  . NM MYOVIEW LTD  02/13/2010  . renal duplex  02/13/2010  . VIDEO BRONCHOSCOPY Bilateral 08/09/2015   Procedure: VIDEO BRONCHOSCOPY WITHOUT FLUORO;  Surgeon: Collene Gobble, MD;  Location: [facility];  Service: Cardiopulmonary;  Laterality: Bilateral;   Family History  Problem Relation Age of Onset  . Heart failure Mother   . Parkinson's disease Mother   . Arthritis Mother   . Arthritis Sister   . Colon cancer Brother   . Rheumatologic disease Neg Hx   . Lung disease Neg Hx    Social History:  reports that she quit smoking about 17 years ago. Her smoking use included cigarettes. She has a 47.00 pack-year smoking history. she has never used smokeless tobacco. She reports that she drinks alcohol. She reports that she does not use drugs. Allergies: No Known Allergies Medications Prior to Admission  Medication Sig Dispense Refill  . acetaminophen (TYLENOL) 500 MG tablet Take 1,000 mg by mouth every 6 (six) hours as needed for headache (pain).    Marland Kitchen  allopurinol (ZYLOPRIM) 300 MG tablet Take 1 tablet (300 mg total) by mouth daily.    Marland Kitchen Dextrose-Sodium Chloride (DEXTROSE 5 % AND 0.9% NACL) 5-0.9 % infusion Inject 75 mL/hr into the vein continuous. 250 mL   .  flecainide (TAMBOCOR) 100 MG tablet Take 1 tablet (100 mg total) by mouth 2 (two) times daily.    Marland Kitchen gabapentin (NEURONTIN) 300 MG capsule Take 1 capsule (300 mg total) by mouth 3 (three) times daily.    Marland Kitchen levothyroxine (SYNTHROID, LEVOTHROID) 88 MCG tablet Take 88 mcg by mouth daily before breakfast.     . lisinopril (PRINIVIL,ZESTRIL) 20 MG tablet Take 1 tablet (20 mg total) by mouth 2 (two) times daily. (Patient taking differently: Take 20 mg by mouth daily. ) 60 tablet 6  . metoprolol tartrate (LOPRESSOR) 25 MG tablet Take 0.5 tablets (12.5 mg total) by mouth 2 (two) times daily.    Marland Kitchen oxyCODONE 10 MG TABS Take 1 tablet (10 mg total) by mouth every 4 (four) hours as needed for moderate pain. 30 tablet 0  . senna-docusate (SENOKOT-S) 8.6-50 MG tablet Take 1 tablet by mouth at bedtime.    Marland Kitchen warfarin (COUMADIN) 5 MG tablet Take 2.5-5 mg by mouth See admin instructions. Take 1 tablet (5 mg) by mouth on Monday and Friday after supper, take 1/2 tablet (2.5 mg) on Sunday, Tuesday, Wednesday, Thursday, Saturday with supper      Home: [place] expects to be discharged to:: Private residence Living Arrangements: Spouse/significant other  Functional History:   Functional Status:  Mobility:          ADL:    Cognition: Cognition Orientation Level: Oriented X4    Blood pressure 122/61, pulse 64, temperature 97.9 F (36.6 C), temperature source Oral, resp. rate 16, height 5\' 4"  (1.626 m), weight 89 kg (196 lb 3.4 oz), SpO2 97 %. Physical Exam  Vitals reviewed. Constitutional: She is oriented to person, place, and time. She appears well-developed and well-nourished.  HENT:  Head: Normocephalic and atraumatic.  +[place]  Eyes: EOM are normal. Right eye exhibits no discharge. Left eye exhibits no discharge.  Neck: Normal range of motion. Neck supple. No thyromegaly present.  Cardiovascular: Regular rhythm.  +[place]  Respiratory: Effort normal and breath sounds normal. No  respiratory distress.  GI: Soft. Bowel sounds are normal. She exhibits no distension.  Musculoskeletal: She exhibits no tenderness.  LE edema  Neurological: She is alert and oriented to person, place, and time.  Motor: RUE: Hand grip 4-/5, proximally limited by pain and apprehension LUE: 5/5 proximal to distal B/l lE:HF 4-/5, KE 4/5, ADF/PF 4+/5 Sensation intact to light touch  Skin: Skin is warm and dry.  Psychiatric: She has a normal mood and affect. Her behavior is normal.    Results for orders placed or performed during the hospital encounter of 12/09/17 (from the past 24 hour(s))  CBC WITH DIFFERENTIAL     Status: Abnormal   Collection Time: 12/10/17  6:16 AM  Result Value Ref Range   WBC 8.0 4.0 - 10.5 K/uL   RBC 3.22 (L) 3.87 - 5.11 MIL/uL   Hemoglobin 10.9 (L) 12.0 - 15.0 g/dL   HCT 33.7 (L) 36.0 - 46.0 %   MCV 104.7 (H) 78.0 - 100.0 fL   MCH 33.9 26.0 - 34.0 pg   MCHC 32.3 30.0 - 36.0 g/dL   RDW 15.5 11.5 - 15.5 %   Platelets 187 150 - 400 K/uL   Neutrophils Relative % 78 %  Neutro Abs 6.1 1.7 - 7.7 K/uL   Lymphocytes Relative 10 %   Lymphs Abs 0.8 0.7 - 4.0 K/uL   Monocytes Relative 10 %   Monocytes Absolute 0.8 0.1 - 1.0 K/uL   Eosinophils Relative 2 %   Eosinophils Absolute 0.2 0.0 - 0.7 K/uL   Basophils Relative 0 %   Basophils Absolute 0.0 0.0 - 0.1 K/uL  Comprehensive metabolic panel     Status: Abnormal   Collection Time: 12/10/17  6:16 AM  Result Value Ref Range   Sodium 136 135 - 145 mmol/L   Potassium 4.1 3.5 - 5.1 mmol/L   Chloride 102 101 - 111 mmol/L   CO2 23 22 - 32 mmol/L   Glucose, Bld 58 (L) 65 - 99 mg/dL   BUN 11 6 - 20 mg/dL   Creatinine, Ser 0.81 0.44 - 1.00 mg/dL   Calcium 11.5 (H) 8.9 - 10.3 mg/dL   Total Protein 4.7 (L) 6.5 - 8.1 g/dL   Albumin 2.4 (L) 3.5 - 5.0 g/dL   AST 63 (H) 15 - 41 U/L   ALT 68 (H) 14 - 54 U/L   Alkaline Phosphatase 82 38 - 126 U/L   Total Bilirubin 0.6 0.3 - 1.2 mg/dL   GFR calc non Af Amer >60 >60 mL/min    GFR calc Af Amer >60 >60 mL/min   Anion gap 11 5 - 15  Heparin level (unfractionated)     Status: None   Collection Time: 12/10/17  6:16 AM  Result Value Ref Range   Heparin Unfractionated 0.41 0.30 - 0.70 IU/mL  Protime-INR     Status: Abnormal   Collection Time: 12/10/17  6:16 AM  Result Value Ref Range   Prothrombin Time 24.8 (H) 11.4 - 15.2 seconds   INR 2.26    No results found.  Assessment/Plan: Diagnosis: diffuse large B-cell non-Hodgkin's lymphoma Labs independently reviewed.  Records reviewed and summated above.  1. Does the need for close, 24 hr/day medical supervision in concert with the patient's rehab needs make it unreasonable for this patient to be served in a less intensive setting? Yes  2. Co-Morbidities requiring supervision/potential complications: diffuse large B-cell non-Hodgkin's lymphoma (meds per Heme/Onc), atrial fibrillation (monitor HR with increased mobility, tranistion from heparin ggt when appropriate), diastolic congestive heart failure (monitor for signs/symtpoms of fluid overload), HTN (monitor and provide prns in accordance with increased physical exertion and pain), TIA, remote tobacco abuse (counsel), ABLA (transfuse if necessary to ensure appropriate perfusion for increased activity tolerance), brachial plexopathy (cont meds for pain, follow up for shrinking of tumor) 3. Due to safety, disease management, pain management and patient education, does the patient require 24 hr/day rehab nursing? Yes 4. Does the patient require coordinated care of a physician, rehab nurse, PT (1-2 hrs/day, 5 days/week) and OT (1-2 hrs/day, 5 days/week) to address physical and functional deficits in the context of the above medical diagnosis(es)? Yes Addressing deficits in the following areas: balance, endurance, locomotion, strength, transferring, bathing, dressing, toileting and psychosocial support 5. Can the patient actively participate in an intensive therapy program of at  least 3 hrs of therapy per day at least 5 days per week? Potentially 6. The potential for patient to make measurable gains while on inpatient rehab is excellent 7. Anticipated functional outcomes upon discharge from inpatient rehab are supervision and min assist  with PT, supervision and min assist with OT, n/a with SLP. 8. Estimated rehab length of stay to reach the above functional goals is:  8-13 days. 9. Anticipated D/C setting: Home 10. Anticipated post D/C treatments: HH therapy and Home excercise program 11. Overall Rehab/Functional Prognosis: good  RECOMMENDATIONS: This patient's condition is appropriate for continued rehabilitative care in the following setting: CIR once medically stable and able to tolerate 3 hours of therapy/day Patient has agreed to participate in recommended program. Yes Note that insurance prior authorization may be required for reimbursement for recommended care.  Comment: Rehab Admissions Coordinator to follow up.  Delice Lesch, MD, ABPMR Retta Diones, RN 12/10/2017

## 2017-12-11 ENCOUNTER — Inpatient Hospital Stay (HOSPITAL_COMMUNITY): Payer: Medicare Other | Admitting: Physical Therapy

## 2017-12-11 ENCOUNTER — Inpatient Hospital Stay (HOSPITAL_COMMUNITY): Payer: Medicare Other

## 2017-12-11 ENCOUNTER — Inpatient Hospital Stay (HOSPITAL_COMMUNITY): Payer: Medicare Other | Admitting: Occupational Therapy

## 2017-12-11 DIAGNOSIS — C8331 Diffuse large B-cell lymphoma, lymph nodes of head, face, and neck: Secondary | ICD-10-CM

## 2017-12-11 DIAGNOSIS — I5032 Chronic diastolic (congestive) heart failure: Secondary | ICD-10-CM

## 2017-12-11 DIAGNOSIS — A609 Anogenital herpesviral infection, unspecified: Secondary | ICD-10-CM

## 2017-12-11 LAB — CBC
HCT: 31.5 % — ABNORMAL LOW (ref 36.0–46.0)
HEMOGLOBIN: 10.5 g/dL — AB (ref 12.0–15.0)
MCH: 35 pg — ABNORMAL HIGH (ref 26.0–34.0)
MCHC: 33.3 g/dL (ref 30.0–36.0)
MCV: 105 fL — ABNORMAL HIGH (ref 78.0–100.0)
Platelets: 161 10*3/uL (ref 150–400)
RBC: 3 MIL/uL — AB (ref 3.87–5.11)
RDW: 16.2 % — ABNORMAL HIGH (ref 11.5–15.5)
WBC: 8.2 10*3/uL (ref 4.0–10.5)

## 2017-12-11 LAB — HEPATITIS B CORE ANTIBODY, IGM: HEP B C IGM: NEGATIVE

## 2017-12-11 LAB — HEPARIN LEVEL (UNFRACTIONATED)

## 2017-12-11 LAB — PROTIME-INR
INR: 2.25
PROTHROMBIN TIME: 24.7 s — AB (ref 11.4–15.2)

## 2017-12-11 LAB — HEPATITIS B SURFACE ANTIGEN: Hepatitis B Surface Ag: NEGATIVE

## 2017-12-11 MED ORDER — FUROSEMIDE 10 MG/ML IJ SOLN
40.0000 mg | INTRAMUSCULAR | Status: AC
  Administered 2017-12-11: 40 mg via INTRAVENOUS
  Filled 2017-12-11: qty 4

## 2017-12-11 MED ORDER — WARFARIN SODIUM 2.5 MG PO TABS
2.5000 mg | ORAL_TABLET | Freq: Once | ORAL | Status: AC
  Administered 2017-12-11: 2.5 mg via ORAL
  Filled 2017-12-11: qty 1

## 2017-12-11 MED ORDER — FUROSEMIDE 10 MG/ML IJ SOLN: 20.0000 mg | Freq: Once | INTRAMUSCULAR | Status: DC

## 2017-12-11 NOTE — Progress Notes (Signed)
Pt sounded increasingly worse this afternoon. Pt sounds extremely congested. B/P is 201/74. PA Reesa Chew at bedside. Pt given IV lasix and stat chest xray ordered. Pt given albuterol treatment and rapid response was called. Pt appears to be breathing better and sounds less congested after albuterol treatment and lasix. Pt has foley in place for diuresis. Will monitor urinary output. Pt does not complain of SOB. B/P is 164/72 after treatment. PA updated and patient has 2L O2 in place. Will continue to monitor patient closely.

## 2017-12-11 NOTE — Progress Notes (Signed)
Occupational Therapy Session Note  Patient Details  Name: Colleen [name] MRN: [id number] Date of Birth: Jul 02, 1935  Today's Date: 12/11/2017 OT Individual Time: 8366-2947 OT Individual Time Calculation (min): 41 min    Short Term Goals: Week 1:  OT Short Term Goal 1 (Week 1): Pt will maintain dynamic sitting balance during selfcare tasks with no more than min assist when sitting unsupported. OT Short Term Goal 2 (Week 1): Pt will complete UB bathing with min assist using LH sponge.  OT Short Term Goal 3 (Week 1): Pt will perform LB bathing sit to stand with mod assist using AE PRN.  OT Short Term Goal 4 (Week 1): Pt will toleerate AAROM of the RUE shoulder flexion 0-90 degrees for greater functional use with selfcare tasks.   OT Short Term Goal 5 (Week 1): Pt will perform toilet transfer with mod assist stand pivot to the 3:1  Skilled Therapeutic Interventions/Progress Updates:    Pt in bed receiving IV chemo during session and reporting being very sleepy.  Did not attempt OOB secondary to this.  Noted weeping coming from upper arm just beside of PICC line.  Nursing made aware as well.  Worked on [employer] exercises from supine position.  Pt completed 2 sets of 10 repetitions for shoulder flexion as well as elbow flexion/extension.  Internal and external rotation as well as supination was performed for 1 set of 10 repetitions as well.  She was able to demonstrate active elbow flexion 2+/5 with elbow extension 2-/5.  Shoulder flexion tolerable to approximately 70 degrees before pain increase was noted.  No AROM noted with shoulder flexion or internal/external rotation.  Pt left with call button and phone in reach to conclude session.    Therapy Documentation Precautions:  Precautions Precautions: Fall, Other (comment) Precaution Comments: Chemo; BP only in LE; incontinent with mobility; R UE pain/lymphedema Restrictions Weight Bearing Restrictions: No  Pain: Pain Assessment Pain  Assessment: No/denies pain ADL: See Function Navigator for Current Functional Status.   Therapy/Group: Individual Therapy  Corryn Madewell  OTR/L 12/11/2017, 4:10 PM

## 2017-12-11 NOTE — Progress Notes (Signed)
RN observed crackles when listening to patient's lungs this afternoon. MD notified of this finding. MD ordered chest xray. Pt does not complain of SOB at this time. Infusion has finished. Iv team consulted to take fluids down. Pt resting comfortably in bed and RN will continue to monitor closely.

## 2017-12-11 NOTE — Progress Notes (Signed)
Occupational Therapy Session Note  Patient Details  Name: Colleen [name] MRN: [id number] Date of Birth: Jun 14, 1935  Today's Date: 12/11/2017 OT Individual GLOV:[id number] and 1884-1660 OT Individual Time Calculation (min): 60 min and 15 min    Short Term Goals: Week 1:  OT Short Term Goal 1 (Week 1): Pt will maintain dynamic sitting balance during selfcare tasks with no more than min assist when sitting unsupported. OT Short Term Goal 2 (Week 1): Pt will complete UB bathing with min assist using LH sponge.  OT Short Term Goal 3 (Week 1): Pt will perform LB bathing sit to stand with mod assist using AE PRN.  OT Short Term Goal 4 (Week 1): Pt will toleerate AAROM of the RUE shoulder flexion 0-90 degrees for greater functional use with selfcare tasks.   OT Short Term Goal 5 (Week 1): Pt will perform toilet transfer with mod assist stand pivot to the 3:1  Skilled Therapeutic Interventions/Progress Updates:    Pt received sitting up in w/c having just completed toileting with RN/NT assist. Pt with continued pain in RUE, subsides with rest and repositioning. Pt completes bathing from w/c level at sink. Pt requires assist for washing LUE, back, and lower LEs, requires increased time and effort for task completion due to fatigue. Pt reports just brief changed and peri-care completed therefore deferred during task completion. Pt dons new gown with MinA for threading sleeve over RUE, total assist for footwear; began education on AE for completing LB bathing and dressing including practice use of sock aide with ModA as Pt only able to use LUE for task completion. Education provided on use of RUE as gross stabilizer to complete grooming tasks with Pt return demonstrating with MinA. Pt left seated in w/c end of session, call bell and needs within reach.   Returned for makeup time of 15 min with Pt agreeable to OT tx. Pt engaged in A/AAROM to RUE for increased mobility and reduction of edema. Pt able to  tolerate movements to digits, forearm and elbow within limits, increased pain with shoulder movements therefore deferred at this time. Education provided on edema reduction techniques with Pt verbalizing understanding. Pt left end of session with Pt up in w/c, needs within reach.   Therapy Documentation Precautions:  Precautions Precautions: Fall, Other (comment) Precaution Comments: Chemo; BP only in LE; incontinent with mobility; R UE pain/lymphedema Restrictions Weight Bearing Restrictions: No    See Function Navigator for Current Functional Status.   Therapy/Group: Individual Therapy  Raymondo Band 12/11/2017, 3:39 PM

## 2017-12-11 NOTE — Plan of Care (Signed)
  Not Progressing RH BLADDER ELIMINATION RH STG MANAGE BLADDER WITH ASSISTANCE Description STG Manage Bladder With min Assistance  12/11/2017 1329 - Not Progressing by Marney Setting, RN RH STG MANAGE BLADDER WITH EQUIPMENT WITH ASSISTANCE Description STG Manage Bladder With Equipment With min  Assistance  12/11/2017 1329 - Not Progressing by Marney Setting, RN RH SKIN INTEGRITY RH STG SKIN FREE OF INFECTION/BREAKDOWN Description Patient will be free of new breakdown prior to discharge  12/11/2017 1329 - Not Progressing by Marney Setting, RN RH STG ABLE TO PERFORM INCISION/WOUND CARE W/ASSISTANCE Description STG Able To Perform Incision/Wound Care With  Mod Assistance.  12/11/2017 1329 - Not Progressing by Marney Setting, RN

## 2017-12-11 NOTE — Significant Event (Signed)
Rapid Response Event Note  Overview:  Called by Rn for patient with fluid overload Time Called: 1710 Arrival Time: 1725 Event Type: Respiratory  Initial Focused Assessment:  Called by Rn for assistance with patient with fluid overload.  On my arrival to patients room, RN and family at bedside.  PAtient sitting in bed receiving neb treatment, has received lasix per MD order.  Patient with audible crackles.  Patient denies SOB, just does not like the sound.  Skin warm and dry   Interventions:  167/65, 71 HR, RR 20 96 % on neb treatment.  Patient does not appear to be in respiratory distress.  CXR has been ordered.  Patient dropped sats to 88% on RA, placed on nasal cannula 2 LPm sats up to 94%.    Plan of Care (if not transferred):  RN to monitor response to lasix and respiratory status and call if assistance needed  Event Summary:   at      at          [facility]

## 2017-12-11 NOTE — Progress Notes (Signed)
[place] for Warfarin Indication: atrial fibrillation  No Known Allergies  Patient Measurements: Height: 5\' 4"  (162.6 cm) Weight: 223 lb 8.7 oz (101.4 kg) IBW/kg (Calculated) : 54.7  Heparin dosing weight = 70.5 kg  Vital Signs: Temp: 98.8 F (37.1 C) (12/20 1229) Temp Source: Oral (12/20 1229) BP: 136/86 (12/20 1229) Pulse Rate: 58 (12/20 1229)  Labs: Recent Labs    12/09/17 0403 12/10/17 0616 12/11/17 0416  HGB 10.4* 10.9* 10.5*  HCT 31.6* 33.7* 31.5*  PLT 176 187 161  LABPROT 23.7* 24.8* 24.7*  INR 2.14 2.26 2.25  HEPARINUNFRC 0.65 0.41 <0.10*  CREATININE 0.61 0.81  --    Estimated Creatinine Clearance: 64.2 mL/min (by C-G formula based on SCr of 0.81 mg/dL).  Assessment: 82 y/o female admitted with lymphadenopathy requiring biopsy. She is on chronic warfarin for history of afib. Warfarin was restarted 12/13 and completed heparin bridge. INR 2.25, therapeutic today. CBC stable. No bleed documented. Received a second dose rituximab today  Home coumadin dose: 2.5mg  daily except 5mg  MF  Goal of Therapy:  INR 2-3 Monitor platelets by anticoagulation protocol: Yes   Plan:  -Coumadin 2.5mg  PO x 1 - per home dose -Daily INR -Monitor for s/sx bleeding  Maryanna Shape, PharmD, BCPS  Clinical Pharmacist  Pager: [phone]  12/11/2017 1:00 PM

## 2017-12-11 NOTE — Progress Notes (Signed)
Physical Therapy Note  Patient Details  Name: Colleen [name] MRN: [id number] Date of Birth: [birth year] Today's Date: 12/11/2017    Time: 830-855 25 minutes  1:1 Pt states Rt UE is feeling better, still painful with movement or touch.  Supine to sit with HOB raised with mod A.  sit to stand in stedy with max A, tactile cues for upright posture.  Sit to stand repetitions from w/c with RW initially max A, requires +2 assist when fatigued.  Pt initially able to perform marching in place with unable to take steps forward due to bilat knee buckling, requiring +2 assist to prevent fall.  Pt able to complete 5 x sit to stand, then unable due to fatigue.  Pt left in w/c with needs at hand.   Fidela Cieslak 12/11/2017, 8:56 AM

## 2017-12-11 NOTE — Progress Notes (Signed)
Pt weeping from right arm. Right arm is edematous. PA notified of weeping and RN instructed to consult IV nurse. IV nurse at bedside and verified fluid was not leaking from PICC. Rifuxin was started and patient appears stable. Vitals signs are WNL. Will continue to monitor. Pt became SOB during PT session and was transferred to bed. Pt reports she uses O2 prn. Pt has O2 in place and is resting comfortably in bed. PT has no complaints of SOB while lying down. Pt still has infusion in place.

## 2017-12-11 NOTE — Progress Notes (Signed)
[facility] PHYSICAL MEDICINE & REHABILITATION     PROGRESS NOTE  Subjective/Complaints:  Patient seen lying in bed this morning. She states she slept well overnight. She states she had a good first in therapies yesterday. She is aware of the plans for confusion today. Yesterday she was also noted to have suspected vaginal herpes.  ROS: Denies CP, SOB, nausea, vomiting, diarrhea.  Objective: Vital Signs: Blood pressure (!) 104/40, pulse 66, temperature 97.7 F (36.5 C), temperature source Axillary, resp. rate 14, height 5\' 4"  (1.626 m), weight 101.4 kg (223 lb 8.7 oz), SpO2 90 %. No results found. Recent Labs    12/10/17 0616 12/11/17 0416  WBC 8.0 8.2  HGB 10.9* 10.5*  HCT 33.7* 31.5*  PLT 187 161   Recent Labs    12/09/17 0403 12/10/17 0616  NA 140 136  K 4.1 4.1  CL 108 102  GLUCOSE 85 58*  BUN 11 11  CREATININE 0.61 0.81  CALCIUM 10.7* 11.5*   CBG (last 3)  Recent Labs    12/09/17 0700 12/09/17 1202 12/09/17 1644  GLUCAP 62* 95 83    Wt Readings from Last 3 Encounters:  12/11/17 101.4 kg (223 lb 8.7 oz)  12/08/17 88 kg (194 lb 0.1 oz)  11/28/17 73 kg (161 lb)    Physical Exam:  BP (!) 104/40 (BP Location: Right Leg)   Pulse 66   Temp 97.7 F (36.5 C) (Axillary) Comment (Src): Drank water  Resp 14   Ht 5\' 4"  (1.626 m)   Wt 101.4 kg (223 lb 8.7 oz)   SpO2 90%   BMI 38.37 kg/m  Constitutional: NAD. Obese  HENT: Normocephalic. Atraumatic Eyes: EOM are normal. No discharge.  Cardiovascular: RRR. No JVD.  Respiratory: No respiratory distress. Clear to auscultation  GI: Bowel sounds are normal. She exhibits no distension.  Musculoskeletal: Edema: RUE Neurological: She isalertand oriented. Motor: RUE: Shoulder abduction, elbow flex/extension 1/5, hand grip 2/5  LUE: 5/5 proximal to distal B/L LE:HF 4-/5, KE 4/5, ADF/PF 4+/5.  Skin. Warm and dry Psychiatric: She has a normal mood and affect. Her behavior is normal. Judgment and thought content  normal.   Assessment/Plan: 1. Functional deficits secondary to diffuse large B-cell non-Hodgkin's lymphoma which require 3+ hours per day of interdisciplinary therapy in a comprehensive inpatient rehab setting. Physiatrist is providing close team supervision and 24 hour management of active medical problems listed below. Physiatrist and rehab team continue to assess barriers to discharge/monitor patient progress toward functional and medical goals.  Function:  Bathing Bathing position   Position: Other (comment)(bedside chair)  Bathing parts Body parts bathed by patient: Chest, Abdomen, Right upper leg, Left upper leg Body parts bathed by helper: Buttocks, Front perineal area, Left arm, Right arm, Right lower leg, Left lower leg, Back  Bathing assist Assist Level: More than reasonable time, Set up   Set up : To open containers, To obtain items  Upper Body Dressing/Undressing Upper body dressing   What is the patient wearing?: Hospital gown                Upper body assist        Lower Body Dressing/Undressing Lower body dressing   What is the patient wearing?: Hospital Gown, Non-skid slipper socks, Ted Hose           Non-skid slipper socks- Performed by helper: Don/doff right sock, Don/doff left sock               TED Hose -  Performed by helper: Don/doff left TED hose, Don/doff right TED hose  Lower body assist        Toileting Toileting Toileting activity did not occur: No continent bowel/bladder event        Toileting assist     Transfers Chair/bed transfer   Chair/bed transfer method: Stand pivot Chair/bed transfer assist level: Maximal assist (Pt 25 - 49%/lift and lower) Chair/bed transfer assistive device: Armrests     Locomotion Ambulation     Max distance: 4 ft Assist level: Touching or steadying assistance (Pt > 75%)   Wheelchair   Type: Manual Max wheelchair distance: (10 ft in room) Assist Level: Moderate assistance (Pt 50 - 74%)   Cognition Comprehension Comprehension assist level: Follows complex conversation/direction with no assist  Expression Expression assist level: Expresses complex ideas: With extra time/assistive device  Social Interaction Social Interaction assist level: Interacts appropriately with others - No medications needed.  Problem Solving Problem solving assist level: Solves complex 90% of the time/cues < 10% of the time  Memory Memory assist level: Complete Independence: No helper     Medical Problem List and Plan: 1.  Decreased functional mobility secondary to diffuse large B-cell non-Hodgkin's lymphoma.   Continue CIR    Rituximab weekly initiated 12/04/2017, infusion today.   Plan CHOP with infusional doxorubicin as an outpatient per Heme/Onc 2.  DVT Prophylaxis/Anticoagulation:  Monitor for any bleeding episodes   Coumadin, INR therapeutic on 12/20 3. Pain Management with brachial plexus impingement due to lymphadenopathy             Neurontin 300 mg 3 times a day, oxycodone as needed 4. Mood: Provide emotional support 5. Neuropsych: This patient is capable of making decisions on her own behalf. 6. Skin/Wound Care: Routine skin checks with lymphedema   Elevation, gentle massage 7. Fluids/Electrolytes/Nutrition: Routine I&O's    BMP within acceptable range on 12/19 8. Atrial fibrillation. Continue Tambocor 100 mg twice a day, Lopressor 12.5 mg twice a day   Controlled on 12/20 9. Hypertension. Lisinopril 20 mg daily. Monitor with increased mobility   Relatively controlled on 12/20, may need to decrease lisinopril 10. Diastolic congestive heart failure. Monitor for any signs of fluid overload. Weigh patient daily Filed Weights   12/09/17 1830 12/10/17 0230 12/11/17 0500  Weight: 88.9 kg (196 lb) 89 kg (196 lb 3.4 oz) 101.4 kg (223 lb 8.7 oz)  11. Hypothyroidism. Synthroid 12. Gout. Allopurinol 300 mg daily. Monitor for any gout flareups 13. Constipation. Laxative assistance 14. RUE  lymphedema: see #3, continue elevation, massage, edema mgt 15. Hypoalbuminemia   Supplement initiated on 12/19 16. Acute blood loss anemia   Hemoglobin 10.5 on 12/20   Continue to monitor 17. 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

## 2017-12-11 NOTE — Plan of Care (Signed)
Pt requiring total assist with toileting and general care

## 2017-12-11 NOTE — Progress Notes (Signed)
Physical Therapy Session Note  Patient Details  Name: Colleen [name] MRN: [id number] Date of Birth: 03-22-1935  Today's Date: 12/11/2017 PT Individual Time: 1320-1400 PT Individual Time Calculation (min): 40 min   Short Term Goals: Week 1:  PT Short Term Goal 1 (Week 1): Pt will perform sit to stand with least restrictive device from w/c with cushion with max A x 1 person. PT Short Term Goal 2 (Week 1): Pt will transfer bed to chair with Max A x 1 person and least restictive device. PT Short Term Goal 3 (Week 1): Pt will ambulate 20 ft with RW and min A x 1.  Skilled Therapeutic Interventions/Progress Updates:    Patient in w/c in room with L arm leaking fluid (RN reports due to third spacing with edema).  RN working for 20 minutes of session to get vitals after Rituxin infusion adjusted.  Patient leaning to L in w/c attempted sit to stand x 1 with +2 A, but pt lowered back to chair due to leaning L too far,  Second attempt able to get up partially with +2 total A to lift into standing, but pt still leaning over and unable to stand erect.  Noted full body edema and pt relates in chair since am sessions.  Sit to stand to stedy with +2 A and assisted to bed.  Sit to supine +2 A for LE's and upper body positioning.  Patient positioned with R arm on pillow and performed LE therex including LAQ, ankle pumps, assisted heel slides and hip abduciton (falling asleep at times with max cues for participation).  Patient assisted with R UE PROM/AAROM for lymphedema control.  Placed pillows under arms and legs and noted L UE with improved color and less swelling in fingers in supine position. Noted SpO2 87% end of session.  RN made aware of continued arm drainage at infusion site as well as of low SpO2.  Pt able to reach all needs and bed alarm set.  Therapy Documentation Precautions:  Precautions Precautions: Fall, Other (comment) Precaution Comments: Chemo; BP only in LE; incontinent with mobility; R UE  pain/lymphedema Restrictions Weight Bearing Restrictions: No General: PT Amount of Missed Time (min): 20 Minutes PT Missed Treatment Reason: Nursing care Pain: Pain Assessment Pain Assessment: No/denies pain   See Function Navigator for Current Functional Status.   Therapy/Group: Individual Therapy  Reginia Naas 12/11/2017, 6:32 PM

## 2017-12-12 ENCOUNTER — Inpatient Hospital Stay (HOSPITAL_COMMUNITY): Payer: Medicare Other | Admitting: Physical Therapy

## 2017-12-12 ENCOUNTER — Telehealth: Payer: Self-pay | Admitting: Oncology

## 2017-12-12 ENCOUNTER — Ambulatory Visit (HOSPITAL_BASED_OUTPATIENT_CLINIC_OR_DEPARTMENT_OTHER): Payer: Medicare Other | Admitting: Oncology

## 2017-12-12 ENCOUNTER — Other Ambulatory Visit: Payer: Medicare Other

## 2017-12-12 ENCOUNTER — Ambulatory Visit: Payer: Medicare Other

## 2017-12-12 ENCOUNTER — Inpatient Hospital Stay (HOSPITAL_COMMUNITY): Payer: Medicare Other | Admitting: Occupational Therapy

## 2017-12-12 ENCOUNTER — Inpatient Hospital Stay (HOSPITAL_COMMUNITY): Payer: Medicare Other

## 2017-12-12 DIAGNOSIS — R0602 Shortness of breath: Secondary | ICD-10-CM

## 2017-12-12 DIAGNOSIS — E871 Hypo-osmolality and hyponatremia: Secondary | ICD-10-CM

## 2017-12-12 DIAGNOSIS — D72829 Elevated white blood cell count, unspecified: Secondary | ICD-10-CM

## 2017-12-12 DIAGNOSIS — N179 Acute kidney failure, unspecified: Secondary | ICD-10-CM

## 2017-12-12 DIAGNOSIS — C8331 Diffuse large B-cell lymphoma, lymph nodes of head, face, and neck: Secondary | ICD-10-CM

## 2017-12-12 LAB — BASIC METABOLIC PANEL
Anion gap: 7 (ref 5–15)
BUN: 33 mg/dL — AB (ref 6–20)
CALCIUM: 12 mg/dL — AB (ref 8.9–10.3)
CHLORIDE: 102 mmol/L (ref 101–111)
CO2: 24 mmol/L (ref 22–32)
CREATININE: 1.39 mg/dL — AB (ref 0.44–1.00)
GFR calc non Af Amer: 35 mL/min — ABNORMAL LOW (ref >60)
GFR, EST AFRICAN AMERICAN: 40 mL/min — AB (ref >60)
GLUCOSE: 116 mg/dL — AB (ref 65–99)
Potassium: 5.1 mmol/L (ref 3.5–5.1)
Sodium: 133 mmol/L — ABNORMAL LOW (ref 135–145)

## 2017-12-12 LAB — CBC
HEMATOCRIT: 30.9 % — AB (ref 36.0–46.0)
HEMOGLOBIN: 10.5 g/dL — AB (ref 12.0–15.0)
MCH: 34.5 pg — AB (ref 26.0–34.0)
MCHC: 34 g/dL (ref 30.0–36.0)
MCV: 101.6 fL — AB (ref 78.0–100.0)
Platelets: 156 10*3/uL (ref 150–400)
RBC: 3.04 MIL/uL — ABNORMAL LOW (ref 3.87–5.11)
RDW: 15.4 % (ref 11.5–15.5)
WBC: 13 10*3/uL — ABNORMAL HIGH (ref 4.0–10.5)

## 2017-12-12 LAB — URINALYSIS, COMPLETE (UACMP) WITH MICROSCOPIC
BILIRUBIN URINE: NEGATIVE
Glucose, UA: NEGATIVE mg/dL
KETONES UR: NEGATIVE mg/dL
NITRITE: NEGATIVE
Protein, ur: NEGATIVE mg/dL
SPECIFIC GRAVITY, URINE: 1.006 (ref 1.005–1.030)
pH: 5 (ref 5.0–8.0)

## 2017-12-12 LAB — PROTIME-INR
INR: 2.99
Prothrombin Time: 30.8 seconds — ABNORMAL HIGH (ref 11.4–15.2)

## 2017-12-12 LAB — PTH-RELATED PEPTIDE

## 2017-12-12 MED ORDER — WARFARIN 0.5 MG HALF TABLET
0.5000 mg | ORAL_TABLET | Freq: Once | ORAL | Status: AC
  Administered 2017-12-12: 0.5 mg via ORAL
  Filled 2017-12-12 (×2): qty 1

## 2017-12-12 MED ORDER — FUROSEMIDE 10 MG/ML IJ SOLN
40.0000 mg | INTRAMUSCULAR | Status: AC
  Administered 2017-12-12: 40 mg via INTRAVENOUS
  Filled 2017-12-12: qty 4

## 2017-12-12 NOTE — Progress Notes (Signed)
Occupational Therapy Session Note  Patient Details  Name: Colleen [name] MRN: [id number] Date of Birth: 03/15/1935  Today's Date: 12/12/2017 OT Individual Time: 1036-1130 OT Individual Time Calculation (min): 54 min    Short Term Goals: Week 1:  OT Short Term Goal 1 (Week 1): Pt will maintain dynamic sitting balance during selfcare tasks with no more than min assist when sitting unsupported. OT Short Term Goal 2 (Week 1): Pt will complete UB bathing with min assist using LH sponge.  OT Short Term Goal 3 (Week 1): Pt will perform LB bathing sit to stand with mod assist using AE PRN.  OT Short Term Goal 4 (Week 1): Pt will toleerate AAROM of the RUE shoulder flexion 0-90 degrees for greater functional use with selfcare tasks.   OT Short Term Goal 5 (Week 1): Pt will perform toilet transfer with mod assist stand pivot to the 3:1  Skilled Therapeutic Interventions/Progress Updates:    1:1. Pt reporting pain in LUE and RN administers medication throughout session. Pt reporting wanting to groom at sink and work on LUE exercise. Pt supine>sitting with MAX A and pt able to thread RUE into gown around back. Pt sit to stand from elevated bed with MOD A for lifting to transfer in stedy into w/c with VC for anterior trunk flexion. Pt grooms at sink with VC for gross power grasp to squeeze Polident onto denture held by OT. Pt brushes hair with 1 rest break. Pt completes AAROM elbow flexion/extension with MOD A for achieving full range, wrist flex/ext, sup/pronation, and composite digit flex/ext for LUE strengthening/edema management. Pt transfers in stedy MAX A +2 to sit to stand from w/c to transfer into recliner. Exited session with pt LUE elevated on pillow, call light in reahc and all needs met.  Therapy Documentation Precautions:  Precautions Precautions: Fall, Other (comment) Precaution Comments: Chemo; BP only in LE; incontinent with mobility; R UE pain/lymphedema Restrictions Weight Bearing  Restrictions: No  See Function Navigator for Current Functional Status.   Therapy/Group: Individual Therapy  Tonny Branch 12/12/2017, 12:18 PM

## 2017-12-12 NOTE — Progress Notes (Signed)
Social Work Patient ID: Colleen [name], female   DOB: 11/16/1935, 82 y.o.   MRN: [id number]   CSW met with pt and her husband 12-10-17 to complete assessment and team conference discussion.  Explained to them that team expected pt to be on CIR 10 to 16  days, but that we were not ready to set a d/c date today.  Pt will likely need min A and pt's husband is not sure he can physically provide this and asked about options.  CSW explained that [facility] may not pay for SNF after CIR and if that is not an option, then family would need to provide care at home or pay for paid caregivers at home or pay for NF care, as needed.  CSW will need to reinforce this with pt/husband.  CSW will continue to assist as needed.

## 2017-12-12 NOTE — Progress Notes (Signed)
Physical Therapy Session Note  Patient Details  Name: Colleen [name] MRN: [id number] Date of Birth: January 12, 1935  Today's Date: 12/12/2017 PT Individual Time: 0915-1000 PT Individual Time Calculation (min): 45 min   Short Term Goals: Week 1:  PT Short Term Goal 1 (Week 1): Pt will perform sit to stand with least restrictive device from w/c with cushion with max A x 1 person. PT Short Term Goal 2 (Week 1): Pt will transfer bed to chair with Max A x 1 person and least restictive device. PT Short Term Goal 3 (Week 1): Pt will ambulate 20 ft with RW and min A x 1.  Skilled Therapeutic Interventions/Progress Updates:    Pt missed 15 min of skilled therapy treatment due to incontinence of bowel prior to therapy session and being assisted with clean-up by NAs. Pt reports "12/10" pain in R UE, nursing aware and to provide pain medication after therapy session, pt agreeable to participate in therapy session. Supine to sit Max A with HOB elevated. Min Assist for sitting balance EOB while donning a 2nd gown. Sit to stand Mod Assist x 2. Stand pivot transfer bed to w/c Max Assist x 2, pt unable to weight shift to take small steps to pivot, needs max v/c and encouragement to complete transfer. Pt has incontinence of bowel while seated in w/c. Attempt sit to stand to stedy, pt unable to stand from low w/c even with assist x 2. Sliding board transfer w/c to bed Max Assist x 2. Sit to supine assist x 2. Max Assist for rolling to R/L for dependent clothing change and pericare. Pt left supine in bed with needs in reach. Pt on 2LO2 throughout therapy session, SpO2 95% and higher.   Therapy Documentation Precautions:  Precautions Precautions: Fall, Other (comment) Precaution Comments: Chemo; BP only in LE; incontinent with mobility; R UE pain/lymphedema Restrictions Weight Bearing Restrictions: No  See Function Navigator for Current Functional Status.   Therapy/Group: Individual Therapy  Excell Seltzer, PT,  DPT  12/12/2017, 12:15 PM

## 2017-12-12 NOTE — Consult Note (Addendum)
[facility] Nurse wound consult note Reason for Consult: Consult requested for arm lymphademia and perineum MASD. Wound type:  Perineum is red, moist, and macerated to area near rectum and inner folds near vagina; appearance is consistent with moisture associated skin skin care.  Purple abrasion from unknown etiology is approx .8X.8cm to inner groin area.  Pt currently has Gerharts cream ordered BID to repel moisture and promote healing to affected areas, agree with this plan of care. Discussed plan of care with patient. Dressing procedure/placement/frequency: Please consult PT for further interventions for arm lymphedema if desired; this is beyond [facility] scope of practice.   Please re-consult if further assistance is needed.  Thank-you,  Julien Girt MSN, [place], [place], [place], [place]

## 2017-12-12 NOTE — Progress Notes (Signed)
[facility]ILIT[facility]     PROGRESS NOTE  Subjective/Complaints:  Pt seen laying in bed this AM.  She had a rough day yesterday with her infusion.  She was minimally able to participate in therapies, became fluid overloaded with SOB.  Discussed with PA.  She slept better overnight after diuresis.  ROS: Denies CP, SOB, nausea, vomiting, diarrhea.  Objective: Vital Signs: Blood pressure (!) 139/58, pulse (!) 58, temperature 97.8 F (36.6 C), temperature source Oral, resp. rate 16, height 5\' 4"  (1.626 m), weight 97.1 kg (214 lb 1.1 oz), SpO2 96 %. Dg Chest Port 1 View  Result Date: 12/11/2017 CLINICAL DATA:  Shortness of breath. EXAM: PORTABLE CHEST 1 VIEW COMPARISON:  11/29/2017 CT and chest radiograph. FINDINGS: Upper limits normal heart size and mediastinal/hilar adenopathy again noted. This is a low volume film with mild bibasilar atelectasis. Equivocal slight increased streaky opacities within the right upper lobe noted. There is no evidence of pneumothorax or definite pleural effusion. IMPRESSION: Equivocal right upper lobe opacities which could represent early infection, focal edema or possibly aspiration. Radiographic follow-up recommended. Low volume film with mild bibasilar atelectasis. Unchanged mediastinal/hilar adenopathy again noted. Electronically Signed   By: Margarette Canada M.D.   On: 12/11/2017 17:48   Recent Labs    12/11/17 0416 12/12/17 0505  WBC 8.2 13.0*  HGB 10.5* 10.5*  HCT 31.5* 30.9*  PLT 161 156   Recent Labs    12/10/17 0616 12/12/17 0505  NA 136 133*  K 4.1 5.1  CL 102 102  GLUCOSE 58* 116*  BUN 11 33*  CREATININE 0.81 1.39*  CALCIUM 11.5* 12.0*   CBG (last 3)  Recent Labs    12/09/17 1202 12/09/17 1644  GLUCAP 95 83    Wt Readings from Last 3 Encounters:  12/12/17 97.1 kg (214 lb 1.1 oz)  12/08/17 88 kg (194 lb 0.1 oz)  11/28/17 73 kg (161 lb)    Physical Exam:  BP (!) 139/58 (BP Location: Right Leg)   Pulse (!) 58    Temp 97.8 F (36.6 C) (Oral)   Resp 16   Ht 5\' 4"  (1.626 m)   Wt 97.1 kg (214 lb 1.1 oz)   SpO2 96%   BMI 36.74 kg/m  Constitutional: NAD. Obese  HENT: Normocephalic. Atraumatic Eyes: EOM are normal. No discharge.  Cardiovascular: RRR. No JVD.  Respiratory: No respiratory distress. Clear to auscultation  GI: Bowel sounds are normal. She exhibits no distension.  Musculoskeletal: Edema: RUE Neurological: She isalertand oriented. Motor: RUE: Shoulder abduction, elbow flex/extension 2-/5, hand grip 2+/5  LUE: 5/5 proximal to distal B/L LE:HF 4/5, KE 4/5, ADF/PF 4+/5.  Skin. Warm and dry Psychiatric: She has a normal mood and affect. Her behavior is normal. Judgment and thought content normal.   Assessment/Plan: 1. Functional deficits secondary to diffuse large B-cell non-Hodgkin's lymphoma which require 3+ hours per day of interdisciplinary therapy in a comprehensive inpatient rehab setting. Physiatrist is providing close team supervision and 24 hour management of active medical problems listed below. Physiatrist and rehab team continue to assess barriers to discharge/monitor patient progress toward functional and medical goals.  Function:  Bathing Bathing position   Position: Wheelchair/chair at sink  Bathing parts Body parts bathed by patient: Chest, Abdomen, Right upper leg, Left upper leg, Right arm Body parts bathed by helper: Left arm, Right lower leg, Left lower leg, Back  Bathing assist Assist Level: (ModA)   Set up : To open containers, To obtain items  Upper Body Dressing/Undressing Upper body dressing   What is the patient wearing?: Hospital gown                Upper body assist Assist Level: Touching or steadying assistance(Pt > 75%)      Lower Body Dressing/Undressing Lower body dressing   What is the patient wearing?: Hospital Gown, Non-skid slipper socks, Ted Hose           Non-skid slipper socks- Performed by helper: Don/doff right sock,  Don/doff left sock               TED Hose - Performed by helper: Don/doff left TED hose, Don/doff right TED hose  Lower body assist        Toileting Toileting Toileting activity did not occur: No continent bowel/bladder event   Toileting steps completed by helper: Adjust clothing prior to toileting, Performs perineal hygiene, Adjust clothing after toileting(per Berkley Harvey, NT report)    Toileting assist Assist level: Two helpers   Transfers Chair/bed transfer   Chair/bed transfer method: Stand pivot Chair/bed transfer assist level: 2 helpers Chair/bed transfer assistive device: Mechanical lift Mechanical lift: Ecologist     Max distance: 4 ft Assist level: Touching or steadying assistance (Pt > 75%)   Wheelchair   Type: Manual Max wheelchair distance: (10 ft in room) Assist Level: Moderate assistance (Pt 50 - 74%)  Cognition Comprehension Comprehension assist level: Follows complex conversation/direction with no assist  Expression Expression assist level: Expresses complex ideas: With extra time/assistive device  Social Interaction Social Interaction assist level: Interacts appropriately with others - No medications needed.  Problem Solving Problem solving assist level: Solves complex 90% of the time/cues < 10% of the time  Memory Memory assist level: Complete Independence: No helper     Medical Problem List and Plan: 1.  Decreased functional mobility secondary to diffuse large B-cell non-Hodgkin's lymphoma.   Continue CIR    Rituximab weekly initiated 12/04/2017, last infusion 12/20.   Plan CHOP with infusional doxorubicin as an outpatient per Heme/Onc 2.  DVT Prophylaxis/Anticoagulation:  Monitor for any bleeding episodes   Coumadin, INR therapeutic on 12/21 3. Pain Management with brachial plexus impingement due to lymphadenopathy             Neurontin 300 mg 3 times a day, oxycodone as needed 4. Mood: Provide emotional support 5.  Neuropsych: This patient is capable of making decisions on her own behalf. 6. Skin/Wound Care: Routine skin checks 7. Fluids/Electrolytes/Nutrition: Routine I&O's  8. Atrial fibrillation. Continue Tambocor 100 mg twice a day, Lopressor 12.5 mg twice a day   Controlled on 12/21 9. Hypertension. Lisinopril 20 mg daily. Monitor with increased mobility   BP ~200 yesterday after infusion, improved with diuresis   Cont to monitor 10. Diastolic congestive heart failure. Monitor for any signs of fluid overload. Weigh patient daily Filed Weights   12/10/17 0230 12/11/17 0500 12/12/17 0500  Weight: 89 kg (196 lb 3.4 oz) 101.4 kg (223 lb 8.7 oz) 97.1 kg (214 lb 1.1 oz)    ?Reliability   Last Echo 2016, Reordered 11. Hypothyroidism. Synthroid 12. Gout. Allopurinol 300 mg daily. Monitor for any gout flareups 13. Constipation. Laxative assistance 14. RUE lymphedema: see #3, continue elevation, massage, edema mgt 15. Hypoalbuminemia   Supplement initiated on 12/19 16. Acute blood loss anemia   Hemoglobin 10.5 on 12/21   Continue to monitor 17. 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 Hyponatremia   Na 133 on 12/21   Cont to monitor   Labs ordered for tomorrow 20. AKI   Cr 1.39 on 12/21    Encourage fluids    Cont to monitor 21. Leukocytosis   UA/Ucx ordered   CXR ordered   Labs ordered for tomorrow   Cont to monitor  LOS (Days) 3 A FACE TO FACE EVALUATION WAS PERFORMED  Ankit Lorie Phenix 12/12/2017 10:56 AM

## 2017-12-12 NOTE — Progress Notes (Signed)
Occupational Therapy Session Note  Patient Details  Name: Colleen [name] MRN: [id number] Date of Birth: 01-18-1935  Today's Date: 12/12/2017 OT Individual Time: 1300-1400 OT Individual Time Calculation (min): 60 min    Short Term Goals: Week 1:  OT Short Term Goal 1 (Week 1): Pt will maintain dynamic sitting balance during selfcare tasks with no more than min assist when sitting unsupported. OT Short Term Goal 2 (Week 1): Pt will complete UB bathing with min assist using LH sponge.  OT Short Term Goal 3 (Week 1): Pt will perform LB bathing sit to stand with mod assist using AE PRN.  OT Short Term Goal 4 (Week 1): Pt will toleerate AAROM of the RUE shoulder flexion 0-90 degrees for greater functional use with selfcare tasks.   OT Short Term Goal 5 (Week 1): Pt will perform toilet transfer with mod assist stand pivot to the 3:1  Skilled Therapeutic Interventions/Progress Updates:    Pt completed donning pullover night gown with max assist to start session.  She needed max demonstrational cueing for sequencing the LUE first and for orienting the gown.  Once completed, practiced sit to stand transition from the bedside chair with max assist.  Also completed transfer to the bedside commode as well with max assist.  Decreased ability to take large steps and LEs became fatigued as well so she demonstrated difficulty controlling descent.  Max assist for managing brief as well in standing.  Finished session with max assist stand pivot transfer back to the bedside recliner.  RUE positioned on pillows with call button and phone in reach.    Therapy Documentation Precautions:  Precautions Precautions: Fall, Other (comment) Precaution Comments: Chemo; BP only in LE; incontinent with mobility; R UE pain/lymphedema Restrictions Weight Bearing Restrictions: No  Pain: Pain Assessment Pain Assessment: No/denies pain ADL: See Function Navigator for Current Functional Status.   Therapy/Group:  Individual Therapy  Raegen Tarpley OTR/L 12/12/2017, 3:43 PM

## 2017-12-12 NOTE — Progress Notes (Signed)
[facility] for Warfarin Indication: atrial fibrillation  No Known Allergies  Patient Measurements: Height: 5\' 4"  (162.6 cm) Weight: 214 lb 1.1 oz (97.1 kg) IBW/kg (Calculated) : 54.7  Heparin dosing weight = 70.5 kg  Vital Signs: Temp: 97.8 F (36.6 C) (12/21 0500) Temp Source: Oral (12/21 0500) BP: 139/58 (12/21 0500) Pulse Rate: 58 (12/21 0500)  Labs: Recent Labs    12/10/17 0616 12/11/17 0416 12/12/17 0505  HGB 10.9* 10.5* 10.5*  HCT 33.7* 31.5* 30.9*  PLT 187 161 156  LABPROT 24.8* 24.7* 30.8*  INR 2.26 2.25 2.99  HEPARINUNFRC 0.41 <0.10*  --   CREATININE 0.81  --  1.39*   Estimated Creatinine Clearance: 36.5 mL/min (A) (by C-G formula based on SCr of 1.39 mg/dL (H)).  Assessment: 82 y/o female admitted with lymphadenopathy requiring biopsy. She is on chronic warfarin for history of afib. Warfarin was restarted 12/13 and completed heparin bridge. INR 2.25 > 2.99, big increase, noticed decrease po intake after rituxan on 12/20. CBC stable. No bleed documented.  Also noticed scr increased from 0.81 to 1.39. Calcium is up 12 (corr 13.3), concern for tumor lysis syndrome? Pt is on allopurinol 300 mg daily s/p rasburicase 6mg  IV on 12/13, which is a pretty low dose.  Home coumadin dose: 2.5mg  daily except 5mg  MF  Goal of Therapy:  INR 2-3 Monitor platelets by anticoagulation protocol: Yes   Plan:  -Coumadin 0.5mg  PO x 1 - per home dose -Daily INR -Monitor for s/sx bleeding -Please consider monitor Ca, scr and uric acid level, start IV hydration, consider consult hematology if concern for tumor lysis syndrome, might need to repeat rasburicase?  Maryanna Shape, PharmD, BCPS  Clinical Pharmacist  Pager: [phone]  12/12/2017 12:19 PM

## 2017-12-12 NOTE — Progress Notes (Signed)
Social Work Assessment and Plan  Patient Details  Name: Colleen [name] MRN: [id number] Date of Birth: [birth year]  Today's Date: 12/10/2017  Problem List:  Patient Active Problem List   Diagnosis Date Noted  . Hypercalcemia   . HSV (herpes simplex virus) anogenital infection   . Lymphedema   . Hypoalbuminemia due to protein-calorie malnutrition ([place])   . Debility 12/09/2017  . Right arm pain   . Brachial plexopathy   . Diffuse large B-cell lymphoma of lymph nodes of neck ([place])   . PAF (paroxysmal atrial fibrillation) ([place])   . Chronic diastolic congestive heart failure ([place])   . History of TIA (transient ischemic attack)   . Benign essential HTN   . Acute blood loss anemia   . Malignant lymphomas of lymph nodes of head, face, and neck ([place]) 12/03/2017  . Hypoxia 11/30/2017  . Lymphadenopathy   . Acute cystitis without hematuria   . Lactic acidosis   . Degenerative arthritis of left knee 08/14/2016  . Arthritis of left foot 08/14/2016  . Peripheral vascular disease ([place]) 08/14/2016  . Emphysema of lung ([place]) 10/19/2015  . Restrictive lung disease 10/19/2015  . Chronic respiratory failure ([place]) 09/04/2015  . Chronic diastolic CHF (congestive heart failure) ([place]) 08/25/2015  . Paroxysmal atrial fibrillation ([place]) 08/24/2015  . Pulmonary infiltrate   . Multiple lung nodules on CT 08/07/2015  . Acute kidney injury (nontraumatic) ([place])   . Acute diastolic heart failure ([place]) 07/30/2015  . Acute diastolic CHF (congestive heart failure) ([place]) 07/30/2015  . Acute respiratory failure with hypoxia ([place]) 07/30/2015  . AKI (acute kidney injury) ([place]) 07/30/2015  . Sinus bradycardia 07/30/2015  . Hyponatremia 07/30/2015  . Junctional bradycardia 07/28/2015  . Shortness of breath 07/28/2015  . Chest pain 02/15/2015  . Carotid artery disease ([place]) 07/15/2013  . Radiculopathy 06/19/2011  . LEG PAIN, LEFT 06/21/2010  . PAIN IN THORACIC SPINE 05/11/2010  . OSTEOPENIA 02/23/2009  .  Acute low back pain 11/17/2007  . Hyperlipidemia 07/03/2007  . Essential hypertension 07/03/2007  . Hypothyroidism 06/10/2007   Past Medical History:  Past Medical History:  Diagnosis Date  . Atrial fibrillation ([place])   . Carotid artery disease ([place])   . CHF (congestive heart failure) ([place])   . Hyperlipidemia   . Hypertension   . Hypothyroidism   . Shortness of breath   . TIA (transient ischemic attack) 2001   Past Surgical History:  Past Surgical History:  Procedure Laterality Date  . ABDOMINAL HYSTERECTOMY  1975   TAH,BSO  . CARDIAC CATHETERIZATION N/A 08/14/2015   Procedure: Right Heart Cath;  Surgeon: Larey Dresser, MD;  Location: [facility];  Service: Cardiovascular;  Laterality: N/A;  . carotid duplex  11/24/2012  . carotid duplex  11/01/2010  . CAROTID ENDARTERECTOMY  2001   left  . CHOLECYSTECTOMY    . DOPPLER ECHOCARDIOGRAPHY  02/13/2010   EF>55%  . DOPPLER ECHOCARDIOGRAPHY  09/29/2007  . NM MYOVIEW LTD  02/13/2010  . renal duplex  02/13/2010  . VIDEO BRONCHOSCOPY Bilateral 08/09/2015   Procedure: VIDEO BRONCHOSCOPY WITHOUT FLUORO;  Surgeon: Collene Gobble, MD;  Location: [facility];  Service: Cardiopulmonary;  Laterality: Bilateral;   Social History:  reports that she quit smoking about 17 years ago. Her smoking use included cigarettes. She has a 47.00 pack-year smoking history. she has never used smokeless tobacco. She reports that she drinks alcohol. She reports that she does not use drugs.  Family / Support Systems Marital Status: Married How  Long?: 47 years Patient Roles: Spouse, Parent, Psychologist, occupational, Other (Comment)(grandparent and great grandparent; Air cabin crew and member; friend) Spouse/Significant Other: Adriahna Shearman - husband - [phone] (h); [phone] (m) Children: Terance Hart - granddtr - [phone] Anticipated Caregiver: Family vs SNF Ability/Limitations of Caregiver: Husband works 3 days a week, son works, Clinical research associate and  granddaughter work.  Husband can only provide very light hands on assist. Caregiver Availability: Intermittent Family Dynamics: close, supportive family  Social History Preferred language: English Religion: Baptist Read: Yes Write: Yes Employment Status: Retired Public relations account executive Issues: none reported Guardian/Conservator: N/A - MD has determined that pt is capable of making her own ends meet.   Abuse/Neglect Abuse/Neglect Assessment Can Be Completed: Yes Physical Abuse: Denies Verbal Abuse: Denies Sexual Abuse: Denies Exploitation of patient/patient's resources: Denies Self-Neglect: Denies  Emotional Status Pt's affect, behavior and adjustment status: Pt is in good spirits and is motivated to work hard and to get better, despite medical challenges. Recent Psychosocial Issues: Pt with recent diagnosis since Thanksgiving.  She is disappointed in her PCP for not getting her in for CT as promised and will be changing to a new PCP. Psychiatric History: none reported Substance Abuse History: none reported  Patient / Family Perceptions, Expectations & Goals Pt/Family understanding of illness & functional limitations: Pt/husband have a good understanding of pt's condition.  CSW will continue to reinforce pt's limitations and see if husband can manage pt at home. Premorbid pt/family roles/activities: Pt spends a lot of time with family and at church. Anticipated changes in roles/activities/participation: Pt would like to return to activities as she is able. Pt/family expectations/goals: Pt wants to get stronger and be able to go home and be able to use her arm.  Community [employer] Agencies: None Premorbid Home Care/DME Agencies: Other (Comment)(walker, w/c, elevated toilet seat, canes, O2) Transportation available at discharge: family Resource referrals recommended: Neuropsychology, Support group (specify)([place] support  group)  Discharge Planning Living  Arrangements: Spouse/significant other, Children( 1 son lives with pt and husband) Support Systems: Spouse/significant other, Children, Other relatives, Friends/neighbors, Social worker community Type of Residence: Private residence Insurance underwriter Resources: Multimedia programmer (specify)([employer]) Museum/gallery curator Resources: Fish farm manager, Family Support Financial Screen Referred: No Money Management: Spouse Does the patient have any problems obtaining your medications?: No Home Management: Pt's husband and son are taking care of this for pt while she is going through treatments. Patient/Family Preliminary Plans: Pt would like to return home, as husband would like for her to, but he is unsure they can provide care of pt at home unless she regains a lot of strength and more independence. Social Work Anticipated Follow Up Needs: HH/OP, SNF, Support Group Expected length of stay: 10 to 16 days  Clinical Impression CSW met with pt and her husband to introduce self and role of CSW, as well as to complete assessment.  They are grateful that pt was accepted on CIR and hope pt will improve while here.  Husband has his own orthopedic challenges (pushed transport chair to pt's room so he could have something to lean on and sit on as needed).  He cannot provide much physical care.  He is concerned about pt's options if she cannot come home.  CSW explained that we can look into SNF, but cautioned them that often insurance will not pay for both CIR and SNF.  Plan to see how pt progresses over this next week and see what team finds next conference.  Pt and husband are  supportive of each other and have good family and church support.  No current concerns/needs/questions besides the above.  CSW will continue to follow and assist as needed.  Marche Hottenstein, Silvestre Mesi 12/11/2017, 1:17 PM

## 2017-12-12 NOTE — Progress Notes (Signed)
Physical Therapy Session Note  Patient Details  Name: Colleen [name] MRN: [id number] Date of Birth: 1935-10-21  Today's Date: 12/12/2017 PT Individual Time: 1430-1500 PT Individual Time Calculation (min): 30 min   Short Term Goals: Week 1:  PT Short Term Goal 1 (Week 1): Pt will perform sit to stand with least restrictive device from w/c with cushion with max A x 1 person. PT Short Term Goal 2 (Week 1): Pt will transfer bed to chair with Max A x 1 person and least restictive device. PT Short Term Goal 3 (Week 1): Pt will ambulate 20 ft with RW and min A x 1.  Skilled Therapeutic Interventions/Progress Updates:    Pt seated in recliner in room, agreeable to participate in therapy session. Pt reports pain has decreased from AM session, pain not rated. Sit to stand to stedy with Max Assist x 2 from recliner. Sit to stand x 4 from stedy with Min Assist x 2. Pt tolerates standing 2 x 2 min and 1 x 10 min with v/c for upright posture and to engage hip extensors. Pt left seated in recliner in room with needs in reach.  Therapy Documentation Precautions:  Precautions Precautions: Fall, Other (comment) Precaution Comments: Chemo; BP only in LE; incontinent with mobility; R UE pain/lymphedema Restrictions Weight Bearing Restrictions: No  See Function Navigator for Current Functional Status.   Therapy/Group: Individual Therapy  Excell Seltzer, PT, DPT  12/12/2017, 4:03 PM

## 2017-12-12 NOTE — Patient Care Conference (Signed)
Inpatient RehabilitationTeam Conference and Plan of Care Update Date: 12/10/2017   Time: 10:00 AM    Patient Name: Colleen [name]      Medical Record Number: [id number]  Date of Birth: 26-May-1935 Sex: Female         Room/Bed: 4M08C/4M08C-01 Payor Info: Payor: [employer]UE [employer] MEDICARE / Plan: BCBS MEDICARE / Product Type: *No Product type* /    Admitting Diagnosis: NHL  Admit Date/Time:  12/09/2017  6:07 PM Admission Comments: No comment available   Primary Diagnosis:  <principal problem not specified> Principal Problem: <principal problem not specified>  Patient Active Problem List   Diagnosis Date Noted  . Hypercalcemia   . HSV (herpes simplex virus) anogenital infection   . Lymphedema   . Hypoalbuminemia due to protein-calorie malnutrition ([place])   . Debility 12/09/2017  . Right arm pain   . Brachial plexopathy   . Diffuse large B-cell lymphoma of lymph nodes of neck ([place])   . PAF (paroxysmal atrial fibrillation) ([place])   . Chronic diastolic congestive heart failure ([place])   . History of TIA (transient ischemic attack)   . Benign essential HTN   . Acute blood loss anemia   . Malignant lymphomas of lymph nodes of head, face, and neck ([place]) 12/03/2017  . Hypoxia 11/30/2017  . Lymphadenopathy   . Acute cystitis without hematuria   . Lactic acidosis   . Degenerative arthritis of left knee 08/14/2016  . Arthritis of left foot 08/14/2016  . Peripheral vascular disease ([place]) 08/14/2016  . Emphysema of lung ([place]) 10/19/2015  . Restrictive lung disease 10/19/2015  . Chronic respiratory failure ([place]) 09/04/2015  . Chronic diastolic CHF (congestive heart failure) ([place]) 08/25/2015  . Paroxysmal atrial fibrillation ([place]) 08/24/2015  . Pulmonary infiltrate   . Multiple lung nodules on CT 08/07/2015  . Acute kidney injury (nontraumatic) ([place])   . Acute diastolic heart failure ([place]) 07/30/2015  . Acute diastolic CHF (congestive heart failure) ([place]) 07/30/2015  . Acute  respiratory failure with hypoxia ([place]) 07/30/2015  . AKI (acute kidney injury) ([place]) 07/30/2015  . Sinus bradycardia 07/30/2015  . Hyponatremia 07/30/2015  . Junctional bradycardia 07/28/2015  . Shortness of breath 07/28/2015  . Chest pain 02/15/2015  . Carotid artery disease ([place]) 07/15/2013  . Radiculopathy 06/19/2011  . LEG PAIN, LEFT 06/21/2010  . PAIN IN THORACIC SPINE 05/11/2010  . OSTEOPENIA 02/23/2009  . Acute low back pain 11/17/2007  . Hyperlipidemia 07/03/2007  . Essential hypertension 07/03/2007  . Hypothyroidism 06/10/2007    Expected Discharge Date: Expected Discharge Date: (10 to 16 days - date TBD)  Team Members Present: Physician leading conference: Dr. Delice Lesch Social Worker Present: Alfonse Alpers, LCSW Nurse Present: Leonette Nutting, RN PT Present: Michaelene Song, PT OT Present: Clyda Greener, OT PPS Coordinator present : Daiva Nakayama, RN, CRRN     Current Status/Progress Goal Weekly Team Focus  Medical   Decreased functional mobility secondary to diffuse large B-cell non-Hodgkin's lymphoma.Rituximab weekly initiated 12/04/2017. Plan [facility]OP with infusional doxorubicin and rituximab   Improve mobility, pain, immunomodulators  See above   Bowel/Bladder   continent of bowel and bladder Lbm 12-09-17  remain continent of bowel and bladder maintain regular bowel pattern  Assist with toileting needs prn   Swallow/Nutrition/ Hydration             ADL's   eval pending         Mobility   max assist bed mobility, max assist sit<>stand, min assist stand pivot with  RW, gait +2 for w/c follow for safety up to 5 ft  min assist goals  activity tolerance, LE strengthening, sit<>stands, transfers   Communication             Safety/Cognition/ Behavioral Observations            Pain   no c/o pain   no c/o pain   Assess  pain q shift and prn   Skin    generalized edema, MASD to peri area abrasion to labia  improving skin issues, no new issues  Assess skin q shift  and prn, barrier cream, microguard powder    Rehab Goals Patient on target to meet rehab goals: Yes Rehab Goals Revised: none - pt's first conference and all evaluations have not yet been completed *See Care Plan and progress notes for long and short-term goals.     Barriers to Discharge  Current Status/Progress Possible Resolutions Date Resolved   Physician    Decreased caregiver support;Medical stability;Other (comments)  Infusion therapy  See above  Therapies, optimize pain meds, recs per Heme/Onc      Nursing  Incontinence;Pending chemo/radiation               PT  Inaccessible home environment;Decreased caregiver support;Home environment access/layout;Incontinence;Lack of/limited family support;Pending chemo/radiation  3 + 5 STE, husband and son both work              OT Decreased caregiver support  son and spouse both work,  pt will need 24 hour assist             SLP                SW                Discharge Planning/Teaching Needs:  Pt and husband would like for pt to return home, but husband cannot provide much hands on physical care due to his physical condition (back and previous bilateral knee replacements).  Pt with lots of stairs in her home.  Family education to be offered closer to pt's d/c, if pt can go home.   Team Discussion:  Pt with high blood pressure and blood loss anemia.  Pt also has low protein and is receiving supplement.  Pt also with controlled Afib and right UE impingement with edema.  Team is completing evaluations today.  Revisions to Treatment Plan:  None - pt is evaluated today    Continued Need for Acute Rehabilitation Level of Care: The patient requires daily medical management by a physician with specialized training in physical medicine and rehabilitation for the following conditions: Daily direction of a multidisciplinary physical rehabilitation program to ensure safe treatment while eliciting the highest outcome that is of practical value to  the patient.: Yes Daily medical management of patient stability for increased activity during participation in an intensive rehabilitation regime.: Yes Daily analysis of laboratory values and/or radiology reports with any subsequent need for medication adjustment of medical intervention for : Neurological problems;Other  Phelix Fudala, Silvestre Mesi 12/11/2017, 12:47 PM

## 2017-12-12 NOTE — Discharge Instructions (Signed)

## 2017-12-12 NOTE — Progress Notes (Signed)
No show-- still hospitalized

## 2017-12-12 NOTE — Progress Notes (Signed)
Inpatient Rehabilitation Center Individual Statement of Services  Patient Name:  CAMIRA GEIDEL  Date:  12/12/2017  Welcome to the [facility].  Our goal is to provide you with an individualized program based on your diagnosis and situation, designed to meet your specific needs.  With this comprehensive rehabilitation program, you will be expected to participate in at least 3 hours of rehabilitation therapies Monday-Friday, with modified therapy programming on the weekends.  Your rehabilitation program will include the following services:  Physical Therapy (PT), Occupational Therapy (OT), 24 hour per day rehabilitation nursing, Neuropsychology, Case Management (Social Worker), Rehabilitation Medicine, Nutrition Services and Pharmacy Services  Weekly team conferences will be held on Wednesdays to discuss your progress.  Your Social Worker will talk with you frequently to get your input and to update you on team discussions.  Team conferences with you and your family in attendance may also be held.  Expected length of stay:  10 to 16 days  Overall anticipated outcome: Minimal assistance with moderate assistance for stairs  Depending on your progress and recovery, your program may change. Your Social Worker will coordinate services and will keep you informed of any changes. Your Social Worker's name and contact numbers are listed  below.  The following services may also be recommended but are not provided by the [facility] will be made to provide these services after discharge if needed.  Arrangements include referral to agencies that provide these services.  Your insurance has been verified to be:  [employer]   Your primary doctor is:  Pt is planning to find a new PCP  Pertinent information will be shared with your  doctor and your insurance company.  Social Worker:  Alfonse Alpers, LCSW  [phone] or ([phone]  Information discussed with and copy given to patient by: Trey Sailors, 12/12/2017, 12:40 AM

## 2017-12-12 NOTE — Telephone Encounter (Signed)
Scheduled appt per 12/19 sch message - per sch message MD will provide patient with appt info - per GM patient will probably not be out of hospital week of 12/31 - scheduled appt for following week

## 2017-12-13 ENCOUNTER — Inpatient Hospital Stay (HOSPITAL_COMMUNITY): Payer: Medicare Other | Admitting: Occupational Therapy

## 2017-12-13 ENCOUNTER — Inpatient Hospital Stay (HOSPITAL_COMMUNITY): Payer: Medicare Other

## 2017-12-13 ENCOUNTER — Inpatient Hospital Stay (HOSPITAL_COMMUNITY): Payer: Medicare Other | Admitting: *Deleted

## 2017-12-13 ENCOUNTER — Other Ambulatory Visit: Payer: Self-pay | Admitting: Oncology

## 2017-12-13 DIAGNOSIS — E8771 Transfusion associated circulatory overload: Secondary | ICD-10-CM

## 2017-12-13 DIAGNOSIS — E8770 Fluid overload, unspecified: Secondary | ICD-10-CM

## 2017-12-13 DIAGNOSIS — I361 Nonrheumatic tricuspid (valve) insufficiency: Secondary | ICD-10-CM

## 2017-12-13 DIAGNOSIS — Z5111 Encounter for antineoplastic chemotherapy: Secondary | ICD-10-CM

## 2017-12-13 LAB — BASIC METABOLIC PANEL
ANION GAP: 7 (ref 5–15)
BUN: 42 mg/dL — ABNORMAL HIGH (ref 6–20)
CHLORIDE: 101 mmol/L (ref 101–111)
CO2: 26 mmol/L (ref 22–32)
Calcium: 11.1 mg/dL — ABNORMAL HIGH (ref 8.9–10.3)
Creatinine, Ser: 1.33 mg/dL — ABNORMAL HIGH (ref 0.44–1.00)
GFR calc non Af Amer: 37 mL/min — ABNORMAL LOW (ref >60)
GFR, EST AFRICAN AMERICAN: 43 mL/min — AB (ref >60)
GLUCOSE: 104 mg/dL — AB (ref 65–99)
POTASSIUM: 4.4 mmol/L (ref 3.5–5.1)
Sodium: 134 mmol/L — ABNORMAL LOW (ref 135–145)

## 2017-12-13 LAB — CBC WITH DIFFERENTIAL/PLATELET
BASOS ABS: 0 10*3/uL (ref 0.0–0.1)
BASOS PCT: 0 %
Eosinophils Absolute: 0 10*3/uL (ref 0.0–0.7)
Eosinophils Relative: 0 %
HEMATOCRIT: 27.1 % — AB (ref 36.0–46.0)
HEMOGLOBIN: 9 g/dL — AB (ref 12.0–15.0)
LYMPHS PCT: 4 %
Lymphs Abs: 0.3 10*3/uL — ABNORMAL LOW (ref 0.7–4.0)
MCH: 34 pg (ref 26.0–34.0)
MCHC: 33.2 g/dL (ref 30.0–36.0)
MCV: 102.3 fL — AB (ref 78.0–100.0)
MONO ABS: 0.6 10*3/uL (ref 0.1–1.0)
Monocytes Relative: 9 %
NEUTROS ABS: 5.6 10*3/uL (ref 1.7–7.7)
NEUTROS PCT: 87 %
Platelets: 124 10*3/uL — ABNORMAL LOW (ref 150–400)
RBC: 2.65 MIL/uL — ABNORMAL LOW (ref 3.87–5.11)
RDW: 15.8 % — AB (ref 11.5–15.5)
WBC: 6.4 10*3/uL (ref 4.0–10.5)

## 2017-12-13 LAB — ECHOCARDIOGRAM COMPLETE
HEIGHTINCHES: 64 in
Weight: 3425.07 oz

## 2017-12-13 LAB — URINE CULTURE

## 2017-12-13 LAB — PROTIME-INR
INR: 2.96
Prothrombin Time: 30.6 seconds — ABNORMAL HIGH (ref 11.4–15.2)

## 2017-12-13 MED ORDER — PERFLUTREN LIPID MICROSPHERE
1.0000 mL | INTRAVENOUS | Status: AC | PRN
  Administered 2017-12-13: 4 mL via INTRAVENOUS
  Filled 2017-12-13: qty 10

## 2017-12-13 MED ORDER — FUROSEMIDE 40 MG PO TABS
40.0000 mg | ORAL_TABLET | Freq: Every day | ORAL | Status: DC
  Administered 2017-12-13 – 2017-12-19 (×6): 40 mg via ORAL
  Filled 2017-12-13 (×6): qty 1

## 2017-12-13 NOTE — Progress Notes (Signed)
Colleen [name]   DOB:[birth year]   YD#:[id number]   EHM#:[id number]  Subjective: Azhane was on the potty when I first came by--this seems to be a recurrent motif!; it means her bowel prophylaxis is working well; she tells me she did fine with her rituximab infusion 2 days ago; she did not remember the paamidronate infusion 12/19 but had no problems with that that she remembers; R UE does not hurt at rest or when she passively moves it but huts a lot when staff move it; she is having trouble walking--weak knees and legs, "I shuffle"; RN in room  Objective: Elderly white woman examined in recliner Vitals:   12/12/17 1634 12/13/17 0518  BP: 133/66 (!) 150/59  Pulse: 63 61  Resp: 18 18  Temp: 97.6 F (36.4 C) 97.7 F (36.5 C)  SpO2: 95% 94%    Body mass index is 36.74 kg/m.  Intake/Output Summary (Last 24 hours) at 12/13/2017 0917 Last data filed at 12/13/2017 0500 Gross per 24 hour  Intake 480 ml  Output 1500 ml  Net -1020 ml   Sclerae unicteric, EOMs intact R  adenopathy is greatly diminished from baseline Lungs no rales or rhonchi--auscuklated anterolaterally Heart regular rate and rhythm Abd soft, nontender, positive bowel sounds Neuro: nonfocal, well oriented, appropriate affect Breasts: Deferred  CBG (last 3)  No results for input(s): GLUCAP in the last 72 hours.   Labs:  Lab Results  Component Value Date   WBC 6.4 12/13/2017   HGB 9.0 (L) 12/13/2017   HCT 27.1 (L) 12/13/2017   MCV 102.3 (H) 12/13/2017   PLT 124 (L) 12/13/2017   NEUTROABS 5.6 12/13/2017    @LASTCHEMISTRY @  Urine Studies No results for input(s): UHGB, CRYS in the last 72 hours.  Invalid input(s): UACOL, UAPR, USPG, UPH, UTP, UGL, UKET, UBIL, UNIT, UROB, [place], UEPI, UWBC, Duwayne Heck [place], [place]  Basic Metabolic Panel: Recent Labs  Lab 12/08/17 0247 12/09/17 0403 12/10/17 0616 12/12/17 0505 12/13/17 0519  NA 140 140 136 133* 134*  K 4.1 4.1 4.1 5.1 4.4  CL 108 108 102 102 101  CO2  24 24 23 24 26   GLUCOSE 91 85 58* 116* 104*  BUN 10 11 11  33* 42*  CREATININE 0.61 0.61 0.81 1.39* 1.33*  CALCIUM 10.4* 10.7* 11.5* 12.0* 11.1*   GFR Estimated Creatinine Clearance: 38.2 mL/min (A) (by C-G formula based on SCr of 1.33 mg/dL (H)). Liver Function Tests: Recent Labs  Lab 12/07/17 0540 12/08/17 0247 12/10/17 0616  AST 35 64* 63*  ALT 26 53 68*  ALKPHOS 50 68 82  BILITOT 0.2* 0.5 0.6  PROT 4.2* 4.7* 4.7*  ALBUMIN 2.2* 2.4* 2.4*   No results for input(s): LIPASE, AMYLASE in the last 168 hours. No results for input(s): AMMONIA in the last 168 hours. Coagulation profile Recent Labs  Lab 12/09/17 0403 12/10/17 0616 12/11/17 0416 12/12/17 0505 12/13/17 0519  INR 2.14 2.26 2.25 2.99 2.96    CBC: Recent Labs  Lab 12/09/17 0403 12/10/17 0616 12/11/17 0416 12/12/17 0505 12/13/17 0519  WBC 5.0 8.0 8.2 13.0* 6.4  NEUTROABS  --  6.1  --   --  5.6  HGB 10.4* 10.9* 10.5* 10.5* 9.0*  HCT 31.6* 33.7* 31.5* 30.9* 27.1*  MCV 102.6* 104.7* 105.0* 101.6* 102.3*  PLT 176 187 161 156 124*   Cardiac Enzymes: No results for input(s): CKTOTAL, CKMB, CKMBINDEX, TROPONINI in the last 168 hours. BNP: Invalid input(s): POCBNP CBG: Recent Labs  Lab 12/08/17 2033 12/09/17 3662  12/09/17 0700 12/09/17 1202 12/09/17 1644  GLUCAP 124* 57* 62* 95 83   D-Dimer No results for input(s): DDIMER in the last 72 hours. Hgb A1c No results for input(s): HGBA1C in the last 72 hours. Lipid Profile No results for input(s): CHOL, HDL, LDLCALC, TRIG, CHOLHDL, LDLDIRECT in the last 72 hours. Thyroid function studies No results for input(s): TSH, T4TOTAL, T3FREE, THYROIDAB in the last 72 hours.  Invalid input(s): FREET3 Anemia work up No results for input(s): VITAMINB12, FOLATE, FERRITIN, TIBC, IRON, RETICCTPCT in the last 72 hours. Microbiology No results found for this or any previous visit (from the past 240 hour(s)).    Studies:  Dg Chest 2 View  Result Date:  12/12/2017 CLINICAL DATA:  Follow-up evaluation for leukocytosis. Right arm swelling. History of CHF, hypertension, TIA, coronary artery disease, atrial fibrillation. EXAM: CHEST  2 VIEW COMPARISON:  Chest x-rays dated 12/11/2017 and 08/14/2015. Chest CT dated 11/29/2017. FINDINGS: Improved aeration within the right upper lung compared to yesterday's chest x-ray suggesting improved fluid status. No new lung findings. Cardiomediastinal silhouette is stable in size and configuration compared to yesterday's exam, compatible with the extensive mediastinal lymphadenopathy demonstrated on recent CT. Lateral view is suboptimal all. I cannot exclude small pleural effusion on the lateral view. IMPRESSION: 1. Improved aeration within the right upper lobe compared to yesterday's chest x-ray, likely indicating improved fluid status. Lungs otherwise clear. Questionable pleural effusion on the lateral projection. 2. Abnormal configuration of the cardiomediastinal silhouette, compatible with the extensive mediastinal and perihilar lymphadenopathy better demonstrated on recent CT chest of 11/29/2017. Electronically Signed   By: Franki Cabot M.D.   On: 12/12/2017 16:02   Ct Soft Tissue Neck Wo Contrast  Result Date: 11/29/2017 CLINICAL DATA:  82 y/o F; patient undergoing evaluation for lymphadenopathy with biopsy in the left subclavian area straightening, now presenting with swelling of the left arm and chest with associated severe pain. EXAM: CT NECK WITHOUT CONTRAST TECHNIQUE: Multidetector CT imaging of the neck was performed following the standard protocol without intravenous contrast. COMPARISON:  11/24/2017 CT of the neck. FINDINGS: Pharynx and larynx: Normal. No mass or swelling. Salivary glands: No inflammation, mass, or stone. Thyroid: Normal. Lymph nodes: Interval progression of diffuse lymphadenopathy, for example a left level 5B lymph node measures 12 x 12 mm, previously 6 mm (series 3, image 88). Increased  lymphadenopathy is best appreciated left posterior cervical chain and left upper axilla but increase in adenopathy is present diffusely, for example a right upper peritracheal node measuring 24 x 31 mm, previously 17 x 23 mm (series 3, image 121). Fat stranding surrounding conglomerate lymph nodes throughout the right cervical chain, right greater than left supraclavicular regions, and left axilla may represent infiltrative neoplasm or lymphedema. Vascular: Calcific atherosclerosis of the aorta and carotid siphons. Limited intracranial: Negative. Visualized orbits: Negative. Mastoids and visualized paranasal sinuses: Small left maxillary sinus mucous retention cyst. Otherwise negative. Skeleton: Stable mild cervical spondylosis. No high-grade bony canal stenosis. Upper chest: Emphysema and scarring in bilateral upper lobes is stable. Other: None. IMPRESSION: Interval progression of lymphadenopathy from 11/24/2017 best appreciated in the left cervical level 5B station, but measurable increase is present diffusely. Findings indicate rapidly progressive lymphoproliferative or metastatic disease. Correlation with biopsy is recommended. Electronically Signed   By: Kristine Garbe M.D.   On: 11/29/2017 22:54   Ct Soft Tissue Neck W Contrast  Result Date: 11/24/2017 CLINICAL DATA:  RIGHT neck and ear pain, enlarging RIGHT supraclavicular and retroauricular mass for 2 weeks. LEFT  facial swelling. History of carotid artery surgery. EXAM: CT NECK WITH CONTRAST TECHNIQUE: Multidetector CT imaging of the neck was performed using the standard protocol following the bolus administration of intravenous contrast. CONTRAST:  34mL ISOVUE-300 IOPAMIDOL (ISOVUE-300) INJECTION 61% COMPARISON:  CT chest September 11, 2015 FINDINGS: PHARYNX AND LARYNX: Normal.  Widely patent airway. SALIVARY GLANDS: Normal. THYROID: Normal. LYMPH NODES: Multilevel severe RIGHT cervical lymphadenopathy. 27 mm RIGHT level 1 B lymph node, round  RIGHT level IIa 23 mm lymph node, 3.1 cm RIGHT supraclavicular lymph node with pericapsular fat stranding RIGHT neck lymphadenopathy. Mild LEFT neck lymphadenopathy including 11 mm intraparotid lymph nodes and 14 mm LEFT level IIa lymph node. VASCULAR: Normal. LIMITED INTRACRANIAL: Normal. VISUALIZED ORBITS: Surgical clips LEFT neck most compatible with endarterectomy. Severe calcific atherosclerosis RIGHT carotid bifurcation. Lymphadenopathy narrows RIGHT internal jugular vein which remains patent. MASTOIDS AND VISUALIZED PARANASAL SINUSES: Well-aerated. SKELETON: Nonacute. No destructive bony lesions. Patient is edentulous. UPPER CHEST: RIGHT apical bullous changes and scarring. Partially imaged bilateral hilar lymphadenopathy. Mediastinal lymphadenopathy including 2 cm aortopulmonary window lymph node, 17 mm pretracheal lymph node. LEFT greater than RIGHT axillary lymphadenopathy, at least 4 cm nodal conglomeration with LEFT retropectoral infiltrative mass. Heart size is probably enlarged. Severe coronary artery calcifications. OTHER: IMPRESSION: 1. Severe RIGHT and mild LEFT neck lymphadenopathy. Severe LEFT axillary lymphadenopathy with lymph edema versus lymphangitic spread of tumor. LEFT retropectoral infiltrative mass. Mediastinal lymphadenopathy. Constellation of findings seen with lymphoproliferative disease, metastatic disease such as breast cancer given LEFT chest findings. Recommend CT chest with contrast versus PET-CT. 2. These results will be called to the ordering clinician or representative by the Radiologist Assistant, and communication documented in the PACS or zVision Dashboard. Aortic Atherosclerosis (ICD10-I70.0). Electronically Signed   By: Elon Alas M.D.   On: 11/24/2017 20:19   Ct Chest W Contrast  Result Date: 11/29/2017 CLINICAL DATA:  Left-sided chest pain and swelling, recent biopsy on right supraclavicular lymph nodes EXAM: CT CHEST WITH CONTRAST TECHNIQUE: Multidetector CT  imaging of the chest was performed during intravenous contrast administration. CONTRAST:  39mL ISOVUE-300 IOPAMIDOL (ISOVUE-300) INJECTION 61% COMPARISON:  11/24/2017 FINDINGS: Cardiovascular: Atherosclerotic changes of the thoracic aorta are noted without aneurysmal dilatation or dissection. Coronary calcifications are seen. No pulmonary emboli are noted. Some attenuation of the pulmonary arterial branches is noted secondary to hilar adenopathy. Additionally some displacement of the vasculature in the neck is noted particularly on the right also related to adenopathy. Mediastinum/Nodes: Thoracic inlet demonstrates evidence of significant right supraclavicular lymphadenopathy. The largest nodal mass measures approximately 10 by 4.9 cm and causes displacement of the adjacent vascular structures in the right neck. Additional adenopathy is noted in the anterior neck similar to that seen on prior examination. Significant mediastinal and hilar adenopathy is identified. The largest of these in the right hilum measures 2.3 cm in short axis. Subcarinal lymphadenopathy is noted measuring 16 mm in short axis. Prevascular adenopathy is noted measuring 2.2 cm in short axis. The esophagus is within normal limits. Considerable adenopathy is noted in the posterior mediastinum surrounding the descending aorta. The nodal mass measures approximately 2.9 by 3.9 cm. Considerable right axillary adenopathy is noted extending along the lateral chest wall. Lungs/Pleura: Emphysematous changes are noted in the lungs bilaterally. Mild scarring is seen. No focal parenchymal mass is noted. No sizable effusion is seen. Upper Abdomen: Gallbladder has been surgically removed. The liver is within normal limits. The adrenal glands and visualized spleen are unremarkable. Musculoskeletal: T9 compression deformity is noted. Some lucency is  noted within. It would be difficult to exclude some metastatic involvement given the findings throughout the  chest. Degenerative changes of the thoracic spine are seen. No rib abnormality is noted. Considerable soft tissue mass is noted in the left anterior chest wall involving the pectoral muscles and subpectoral region. The overall size of the mass measures approximately 10.5 by 6.7 cm and Ing gallstone the subclavian and axillary artery is and veins. Significant skin thickening and edema is noted within the left breast. It would be difficult to exclude a portion of this representing a breast mass. There is apparent involvement of the chest wall although no bony destruction is seen. IMPRESSION: Diffuse lymphadenopathy involving the supraclavicular regions, mediastinum, bilateral hila as well as the axillary regions bilaterally as described above. Correlation with the recent biopsy results is recommended. It would be difficult to exclude a portion of the abnormality in the left chest wall to be related to the overlying breast given the degree of skin thickening and edema identified inferiorly. Alternatively this may represent an aggressive lymphoma. Further workup with PET-CT is recommended. T9 compression deformity with some lucencies noted within. This may be strictly related to the compression deformity although the possibility of metastatic disease would deserve consideration as well. No other definitive bony abnormality is noted. Aortic Atherosclerosis (ICD10-I70.0) and Emphysema (ICD10-J43.9). Electronically Signed   By: Inez Catalina M.D.   On: 11/29/2017 20:01   Mr Neck Soft Tissue Only W Wo Contrast  Result Date: 11/30/2017 CLINICAL DATA:  Rapidly progressive lymphadenopathy, RIGHT arm swelling since Thanksgiving. Suspected lymphoma. Status post biopsy, results pending. EXAM: MRI OF THE NECK WITH CONTRAST TECHNIQUE: Multiplanar, multisequence MR imaging was performed following the administration of intravenous contrast. CONTRAST:  107mL MULTIHANCE GADOBENATE DIMEGLUMINE 529 MG/ML IV SOLN COMPARISON:  CT neck  November 29, 2017 and CT neck November 24, 2017 FINDINGS: Moderately motion degraded examination. PHARYNX AND LARYNX: Normal.  Widely patent airway. SALIVARY GLANDS: Intraparotid lymph nodes bilaterally measuring to 17 mm in deep lobe. No definite primary parotid mass though limited by motion. THYROID: Normal. LYMPH NODES: Severe RIGHT greater than LEFT cervical lymphadenopathy with intermediate to bright T2 signal and, mild enhancement. Lymphadenopathy all levels of the neck, at least 8.4 x 4.2 cm RIGHT supraclavicular nodal conglomeration. Lymphadenopathy abuts the scalene muscles impinges upon the RIGHT brachials plexus. Limited assessment of the brachium plexus due to motion. Massive LEFT retropectoral lymphadenopathy, lesser extent on the RIGHT. VASCULAR: Normal flow-voids. LIMITED INTRACRANIAL: Normal. VISUALIZED ORBITS: Normal. MASTOIDS AND VISUALIZED PARANASAL SINUSES: Well-aerated. SKELETON: No abnormal bone marrow signal though not tailored for evaluation. Limited assessment of spinal cord due to motion, no suspicious intracanalicular enhancement. UPPER CHEST: Lung apices are clear. No superior mediastinal lymphadenopathy. OTHER: Susceptibility artifact LEFT neck corresponding to use surgical clips. IMPRESSION: 1. Moderately motion degraded examination. 2. Bulky cervical and included chest lymphadenopathy with RIGHT brachial plexus impingement. Electronically Signed   By: Elon Alas M.D.   On: 11/30/2017 22:16   Dg Chest Port 1 View  Result Date: 12/11/2017 CLINICAL DATA:  Shortness of breath. EXAM: PORTABLE CHEST 1 VIEW COMPARISON:  11/29/2017 CT and chest radiograph. FINDINGS: Upper limits normal heart size and mediastinal/hilar adenopathy again noted. This is a low volume film with mild bibasilar atelectasis. Equivocal slight increased streaky opacities within the right upper lobe noted. There is no evidence of pneumothorax or definite pleural effusion. IMPRESSION: Equivocal right upper lobe  opacities which could represent early infection, focal edema or possibly aspiration. Radiographic follow-up recommended. Low volume  film with mild bibasilar atelectasis. Unchanged mediastinal/hilar adenopathy again noted. Electronically Signed   By: Margarette Canada M.D.   On: 12/11/2017 17:48   Dg Chest Portable 1 View  Result Date: 11/29/2017 CLINICAL DATA:  Abnormal lymphadenopathy in the neck, chest swelling, initial encounter EXAM: PORTABLE CHEST 1 VIEW COMPARISON:  11/24/2017 FINDINGS: Cardiac shadow is within normal limits. Soft tissue mass lesion is again noted superimposed over the left hilum similar to that seen on recent CT of the neck. Fullness in the hilar regions is noted bilaterally likely representing some adenopathy. Aortic calcifications are seen. The lungs are clear. No bony abnormality is noted. IMPRESSION: Changes consistent with the known left upper lobe mass lesion as well as apparent hilar adenopathy particularly on the right. CT of the chest is recommended for further evaluation as previously described. Electronically Signed   By: Inez Catalina M.D.   On: 11/29/2017 19:10   [place] Core Biopsy (lymph Nodes)  Result Date: 12/02/2017 INDICATION: 82 year old female with presumed lymphoma. She presents for biopsy of nodal mass of the left axilla. EXAM: ULTRASOUND-GUIDED LYMPH NODE BIOPSY MEDICATIONS: None. ANESTHESIA/SEDATION: Moderate (conscious) sedation was employed during this procedure. A total of Versed 2.0 mg and Fentanyl 50 mcg was administered intravenously. Moderate Sedation Time: 10 minutes. The patient's level of consciousness and vital signs were monitored continuously by radiology nursing throughout the procedure under my direct supervision. FLUOROSCOPY TIME:  None COMPLICATIONS: None PROCEDURE: Informed written consent was obtained from the patient after a thorough discussion of the procedural risks, benefits and alternatives. All questions were addressed. Maximal Sterile Barrier  Technique was utilized including caps, mask, sterile gowns, sterile gloves, sterile drape, hand hygiene and skin antiseptic. A timeout was performed prior to the initiation of the procedure. Patient positioned supine position on the ultrasound stretcher. Ultrasound images of the left axillary region were performed with images stored and sent to PACs. The patient is prepped and draped in the usual sterile fashion. The skin and subcutaneous tissues were generously infiltrated 1% lidocaine for local anesthesia. Using ultrasound guidance, at least 8 separate 18 gauge core biopsy of the left axillary node were performed. Specimen placed in the saline. Final image was stored. Patient tolerated the procedure well and remained hemodynamically stable throughout. No complications were encountered and no significant blood loss. IMPRESSION: Status post ultrasound-guided biopsy of left axillary mass. Tissue specimen sent to pathology for complete histopathologic analysis. Signed, Dulcy Fanny. Earleen Newport, DO [facility] Radiology Electronically Signed   By: Corrie Mckusick D.O.   On: 12/02/2017 15:51   [place] Core Biopsy (lymph Nodes)  Result Date: 11/28/2017 INDICATION: 82 year old female with a clinical history of rapidly progressive right submental, cervical and supraclavicular lymphadenopathy. She presents today for urgent ultrasound-guided core biopsy. EXAM: ULTRASOUND GUIDED core needle BIOPSY OF right supraclavicular lymph node MEDICATIONS: None. ANESTHESIA/SEDATION: Fentanyl 100 mcg IV; Versed 2 mg IV Moderate Sedation Time:  15 minutes The patient was continuously monitored during the procedure by the interventional radiology nurse under my direct supervision. PROCEDURE: The procedure, risks, benefits, and alternatives were explained to the patient. Questions regarding the procedure were encouraged and answered. The patient understands and consents to the procedure. The right  submental region, the right cervical chain in the right supraclavicular lymph nodes or all interrogated with ultrasound. There are numerous enlarged, hypoechoic an abnormal appearing lymph nodes in each station. The most solid appearing lymph nodes are present in the right supraclavicular station. The right submental lymph node is slightly  more cystic and may not yield viable cells. Therefore, the decision was made to proceed with biopsy of the right supraclavicular station lymph nodes. The right supraclavicular region was prepped with chlorhexidine in a sterile fashion, and a sterile drape was applied covering the operative field. A sterile gown and sterile gloves were used for the procedure. Local anesthesia was provided with 1% Lidocaine. A small dermatotomy was made. Under real-time sonographic guidance, numerous 18 gauge core biopsies were obtained of several lymph nodes in the right supraclavicular station. Biopsy specimens were placed in saline and delivered to pathology for further analysis. Post biopsy imaging demonstrates no evidence of hematoma or active bleeding. The patient tolerated the procedure well. There was no significant bleeding. COMPLICATIONS: None immediate. FINDINGS: Extensive hypoechoic lymphadenopathy throughout the right neck and right submental region. IMPRESSION: Technically successful ultrasound-guided core biopsy of right supraclavicular lymph nodes. Electronically Signed   By: Jacqulynn Cadet M.D.   On: 11/28/2017 09:04    Assessment: 82 y.o. [place] woman with a diffuse large cell B-cell non-Hodgkin's lymphoma, high-grade, diagnosed by lymph node biopsy December 2018  (1) prednisone started 12/03/2017, continued for 5 days  (a) repeat 12/19/2017  (2) rituximab weekly started 12/04/2017  (a) 2d dose 12/11/2017 w/o event  (b) 3d dose due 12/18/2017  (3) to start CHOP with infusional doxorubicin via port (placement pending) 12/31/2017  (4) no evidence of tumor lysis--  continue allopurinol, monitor labs (written)  (5) hypercalcemia--related to NHL; PTH and PTH related peptide not elevated  (a) pamidronate given 12/10/2017     Plan: Ginni has had a favorable response to initial therapy for her NHL. Once she is discharged we will proceed to curative therapy with CHOP (she is scheduled in our office 01/09). While in the hospital we will continue rituximab weekly and I have written the orders for her 12/27 dose  The planned port will be very helpful to [place] as we plan on infusional doxorubicin which requires a safe central line.  Please contact my partners on call with any questions or concerns related to Kenyatte's NHL.. I will be back 12/28 and start her on a second round of prednisone at that time.      Bobetta Lime, MD 12/13/2017  9:17 AM Medical Oncology and Hematology [facility] [address] [place], [place] [address] Tel. [phone]    Fax. [phone]

## 2017-12-13 NOTE — Progress Notes (Signed)
Occupational Therapy Session Note  Patient Details  Name: Colleen [name] MRN: [id number] Date of Birth: October 25, 1935  Today's Date: 12/13/2017 OT Individual Time: 1430-1500 OT Individual Time Calculation (min): 30 min    Short Term Goals: Week 1:  OT Short Term Goal 1 (Week 1): Pt will maintain dynamic sitting balance during selfcare tasks with no more than min assist when sitting unsupported. OT Short Term Goal 2 (Week 1): Pt will complete UB bathing with min assist using LH sponge.  OT Short Term Goal 3 (Week 1): Pt will perform LB bathing sit to stand with mod assist using AE PRN.  OT Short Term Goal 4 (Week 1): Pt will toleerate AAROM of the RUE shoulder flexion 0-90 degrees for greater functional use with selfcare tasks.   OT Short Term Goal 5 (Week 1): Pt will perform toilet transfer with mod assist stand pivot to the 3:1  Skilled Therapeutic Interventions/Progress Updates:    Treatment session focused on pain management, positioning, therex/activity tolerance, and pt education. Upon entering, pt upright in recline with family present with noted swelling to R UE. Therapist provided gentle AAROm to R UE with light retrograde massage.Pt completed sets of active flexion/extension, bicep curls and shoulder flexion for R UE to decrease edema and increase ROM/strength. Therapist educated pt on edema control and positioning and repositioned R UE above heart level with pillows for max comfort. Pt reported overall on-going pain with 3-4/10 level. Pt left resting with all needs met.   Therapy Documentation Precautions:  Precautions Precautions: Fall, Other (comment) Precaution Comments: Chemo; BP only in LE; incontinent with mobility; R UE pain/lymphedema Restrictions Weight Bearing Restrictions: No    Vital Signs: Therapy Vitals Temp: 98.1 F (36.7 C) Temp Source: Oral Pulse Rate: (!) 58 Resp: 18 BP: (!) 149/66 Patient Position (if appropriate): Sitting Oxygen Therapy SpO2: 94  % O2 Device: Nasal Cannula O2 Flow Rate (L/min): 2 L/min Pain: Pain Assessment Pain Score: 4  Pain Type: Chronic pain Patients Stated Pain Goal: 0 Pain Intervention(s): Repositioned  See Function Navigator for Current Functional Status.   Therapy/Group: Individual Therapy  Delon Sacramento 12/13/2017, 4:29 PM

## 2017-12-13 NOTE — Progress Notes (Signed)
Physical Therapy Session Note  Patient Details  Name: Colleen [name] MRN: [id number] Date of Birth: 05-09-1935  Today's Date: 12/13/2017 PT Individual Time: 1115-1213 PT Individual Time Calculation (min): 58 min   Short Term Goals: Week 1:  PT Short Term Goal 1 (Week 1): Pt will perform sit to stand with least restrictive device from w/c with cushion with max A x 1 person. PT Short Term Goal 2 (Week 1): Pt will transfer bed to chair with Max A x 1 person and least restictive device. PT Short Term Goal 3 (Week 1): Pt will ambulate 20 ft with RW and min A x 1.  Skilled Therapeutic Interventions/Progress Updates:    Tx focused on therex for strengthening and cardiorespiratory endurance. PT up in recliner, just finishing with IV team. Pt declines leaving room this morning, but agreeable to tx in room.  Seated therex with cues for technique:  Marching 3x10 LAQ 2x10 with 5 sec hold Ankle pumps 3x10 LUE shoulder raise x10 Hip ADD squeeze 2x10 Glute sets 2x10  Unsupported sitting edge of chair with diaphragmatic breathing 2x5 with tacti cues for posture.   Sit>stand at Steady with +2 assist and multiple attempts Standing 1x2 min, 1x23min, 1x1 min with Mod lift assist and cues for posture.  AAROM of RUE in all planes in comfortable range while standing.  Standing lateral weight shifts and mini-heel raises  Pt left up in chair with all needs in reach.   Therapy Documentation Precautions:  Precautions Precautions: Fall, Other (comment) Precaution Comments: Chemo; BP only in LE; incontinent with mobility; R UE pain/lymphedema Restrictions Weight Bearing Restrictions: No General:   Vital Signs:  Pain: none beyond unrated pain with LUE movement, subsides when resting   See Function Navigator for Current Functional Status.   Therapy/Group: Individual Therapy  Albertine Lafoy Soundra Pilon, PT, DPT  12/13/2017, 11:52 AM

## 2017-12-13 NOTE — Plan of Care (Signed)
  Not Progressing RH BOWEL ELIMINATION RH STG MANAGE BOWEL WITH ASSISTANCE Description STG Manage Bowel with min  Assistance.  12/13/2017 9977 - Not Progressing by Brita Romp, RN RH BLADDER ELIMINATION RH STG MANAGE BLADDER WITH ASSISTANCE Description STG Manage Bladder With min Assistance  12/13/2017 0943 - Not Progressing by Brita Romp, RN RH STG MANAGE BLADDER WITH EQUIPMENT WITH ASSISTANCE Description STG Manage Bladder With Equipment With min  Assistance  12/13/2017 0943 - Not Progressing by Brita Romp, RN RH SKIN INTEGRITY RH STG SKIN FREE OF INFECTION/BREAKDOWN Description Patient will be free of new breakdown prior to discharge  12/13/2017 0943 - Not Progressing by Brita Romp, RN RH STG MAINTAIN SKIN INTEGRITY WITH ASSISTANCE Description STG Maintain Skin Integrity With min  Assistance.  12/13/2017 4142 - Not Progressing by Brita Romp, RN RH STG ABLE TO PERFORM INCISION/WOUND CARE W/ASSISTANCE Description STG Able To Perform Incision/Wound Care With  Mod Assistance.  12/13/2017 3953 - Not Progressing by Brita Romp, RN RH KNOWLEDGE DEFICIT GENERAL RH STG INCREASE KNOWLEDGE OF SELF CARE AFTER HOSPITALIZATION 12/13/2017 0943 - Not Progressing by Brita Romp, RN

## 2017-12-13 NOTE — Progress Notes (Signed)
[place] PHYSICAL MEDICINE & REHABILITATION     PROGRESS NOTE  Subjective/Complaints:  And seen lying in bed this morning. She states she slept well overnight. She notes improvement in right hand function. Her PICC line continues to drain, discussed with Heme/Onc and plan to DC PICC and place Port.  ROS: Denies CP, SOB, nausea, vomiting, diarrhea.  Objective: Vital Signs: Blood pressure (!) 150/59, pulse 61, temperature 97.7 F (36.5 C), temperature source Oral, resp. rate 18, height 5\' 4"  (1.626 m), weight 97.1 kg (214 lb 1.1 oz), SpO2 94 %. Dg Chest 2 View  Result Date: 12/12/2017 CLINICAL DATA:  Follow-up evaluation for leukocytosis. Right arm swelling. History of CHF, hypertension, TIA, coronary artery disease, atrial fibrillation. EXAM: CHEST  2 VIEW COMPARISON:  Chest x-rays dated 12/11/2017 and 08/14/2015. Chest CT dated 11/29/2017. FINDINGS: Improved aeration within the right upper lung compared to yesterday's chest x-ray suggesting improved fluid status. No new lung findings. Cardiomediastinal silhouette is stable in size and configuration compared to yesterday's exam, compatible with the extensive mediastinal lymphadenopathy demonstrated on recent CT. Lateral view is suboptimal all. I cannot exclude small pleural effusion on the lateral view. IMPRESSION: 1. Improved aeration within the right upper lobe compared to yesterday's chest x-ray, likely indicating improved fluid status. Lungs otherwise clear. Questionable pleural effusion on the lateral projection. 2. Abnormal configuration of the cardiomediastinal silhouette, compatible with the extensive mediastinal and perihilar lymphadenopathy better demonstrated on recent CT chest of 11/29/2017. Electronically Signed   By: Franki Cabot M.D.   On: 12/12/2017 16:02   Dg Chest Port 1 View  Result Date: 12/11/2017 CLINICAL DATA:  Shortness of breath. EXAM: PORTABLE CHEST 1 VIEW COMPARISON:  11/29/2017 CT and chest radiograph. FINDINGS:  Upper limits normal heart size and mediastinal/hilar adenopathy again noted. This is a low volume film with mild bibasilar atelectasis. Equivocal slight increased streaky opacities within the right upper lobe noted. There is no evidence of pneumothorax or definite pleural effusion. IMPRESSION: Equivocal right upper lobe opacities which could represent early infection, focal edema or possibly aspiration. Radiographic follow-up recommended. Low volume film with mild bibasilar atelectasis. Unchanged mediastinal/hilar adenopathy again noted. Electronically Signed   By: Margarette Canada M.D.   On: 12/11/2017 17:48   Recent Labs    12/12/17 0505 12/13/17 0519  WBC 13.0* 6.4  HGB 10.5* 9.0*  HCT 30.9* 27.1*  PLT 156 124*   Recent Labs    12/12/17 0505 12/13/17 0519  NA 133* 134*  K 5.1 4.4  CL 102 101  GLUCOSE 116* 104*  BUN 33* 42*  CREATININE 1.39* 1.33*  CALCIUM 12.0* 11.1*   CBG (last 3)  No results for input(s): GLUCAP in the last 72 hours.  Wt Readings from Last 3 Encounters:  12/12/17 97.1 kg (214 lb 1.1 oz)  12/08/17 88 kg (194 lb 0.1 oz)  11/28/17 73 kg (161 lb)    Physical Exam:  BP (!) 150/59 (BP Location: Right Leg)   Pulse 61   Temp 97.7 F (36.5 C) (Oral)   Resp 18   Ht 5\' 4"  (1.626 m)   Wt 97.1 kg (214 lb 1.1 oz)   SpO2 94%   BMI 36.74 kg/m  Constitutional: NAD. Obese  HENT: Normocephalic. Atraumatic Eyes: EOM are normal. No discharge.  Cardiovascular: RRR. No JVD.  Respiratory: No respiratory distress. Clear to auscultation  GI: Bowel sounds are normal. She exhibits no distension.  Musculoskeletal: Edema: RUE Neurological: She isalertand oriented. Motor: RUE: Shoulder abduction, elbow flex/extension 1/5, hand  grip 3-/5  LUE: 5/5 proximal to distal B/L LE:HF 4/5, KE 4/5, ADF/PF 4+/5.  Skin. Warm and dry Psychiatric: She has a normal mood and affect. Her behavior is normal. Judgment and thought content normal.   Assessment/Plan: 1. Functional deficits  secondary to diffuse large B-cell non-Hodgkin's lymphoma which require 3+ hours per day of interdisciplinary therapy in a comprehensive inpatient rehab setting. Physiatrist is providing close team supervision and 24 hour management of active medical problems listed below. Physiatrist and rehab team continue to assess barriers to discharge/monitor patient progress toward functional and medical goals.  Function:  Bathing Bathing position   Position: Wheelchair/chair at sink  Bathing parts Body parts bathed by patient: Chest, Abdomen, Right upper leg, Left upper leg, Right arm Body parts bathed by helper: Left arm, Right lower leg, Left lower leg, Back  Bathing assist Assist Level: (ModA)   Set up : To open containers, To obtain items  Upper Body Dressing/Undressing Upper body dressing   What is the patient wearing?: Pull over shirt/dress       Pull over shirt/dress - Perfomed by helper: Thread/unthread right sleeve, Thread/unthread left sleeve, Put head through opening, Pull shirt over trunk        Upper body assist Assist Level: Touching or steadying assistance(Pt > 75%)      Lower Body Dressing/Undressing Lower body dressing   What is the patient wearing?: Hospital Gown, Non-skid slipper socks, Ted Hose           Non-skid slipper socks- Performed by helper: Don/doff right sock, Don/doff left sock               TED Hose - Performed by helper: Don/doff left TED hose, Don/doff right TED hose  Lower body assist        Toileting Toileting Toileting activity did not occur: No continent bowel/bladder event   Toileting steps completed by helper: Adjust clothing prior to toileting, Performs perineal hygiene, Adjust clothing after toileting    Toileting assist Assist level: Two helpers(per Elmo Putt, NT report)   Transfers Chair/bed transfer   Chair/bed transfer method: Squat pivot Chair/bed transfer assist level: 2 helpers Chair/bed transfer assistive device:  Mechanical lift Mechanical lift: Ecologist     Max distance: 4 ft Assist level: Touching or steadying assistance (Pt > 75%)   Wheelchair   Type: Manual Max wheelchair distance: (10 ft in room) Assist Level: Moderate assistance (Pt 50 - 74%)  Cognition Comprehension Comprehension assist level: Understands basic 90% of the time/cues < 10% of the time  Expression Expression assist level: Expresses basic needs/ideas: With extra time/assistive device  Social Interaction Social Interaction assist level: Interacts appropriately 90% of the time - Needs monitoring or encouragement for participation or interaction.  Problem Solving Problem solving assist level: Solves basic 75 - 89% of the time/requires cueing 10 - 24% of the time  Memory Memory assist level: Recognizes or recalls 75 - 89% of the time/requires cueing 10 - 24% of the time     Medical Problem List and Plan: 1.  Decreased functional mobility secondary to diffuse large B-cell non-Hodgkin's lymphoma.   Continue CIR    Rituximab weekly initiated 12/04/2017, last infusion 12/20.   Plan CHOP with infusional doxorubicin as an outpatient per Heme/Onc 2.  DVT Prophylaxis/Anticoagulation:  Monitor for any bleeding episodes   Coumadin, INR therapeutic on 12/22, will need to hold for [facility] placement 3. Pain Management with brachial plexus impingement due to lymphadenopathy  Neurontin 300 mg 3 times a day, oxycodone as needed 4. Mood: Provide emotional support 5. Neuropsych: This patient is capable of making decisions on her own behalf. 6. Skin/Wound Care: Routine skin checks 7. Fluids/Electrolytes/Nutrition: Routine I&O's  8. Atrial fibrillation. Continue Tambocor 100 mg twice a day, Lopressor 12.5 mg twice a day   Controlled on 12/22 9. Hypertension. Lisinopril 20 mg daily. Monitor with increased mobility   Slightly elevated this a.m., otherwise relatively controlled on 12/22 10. Diastolic congestive  heart failure. Monitor for any signs of fluid overload. Weigh patient daily Filed Weights   12/10/17 0230 12/11/17 0500 12/12/17 0500  Weight: 89 kg (196 lb 3.4 oz) 101.4 kg (223 lb 8.7 oz) 97.1 kg (214 lb 1.1 oz)    ?Reliability   Last Echo 2016, Reordered, pending 11. Hypothyroidism. Synthroid 12. Gout. Allopurinol 300 mg daily. Monitor for any gout flareups 13. Constipation. Laxative assistance 14. RUE lymphedema: see #3, continue elevation, massage, edema mgt 15. Hypoalbuminemia   Supplement initiated on 12/19 16. Acute blood loss anemia   Hemoglobin 9.0 on 12/22   Continue to monitor 17. 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 Hyponatremia   Na 134 on 12/22   Cont to monitor 20. AKI   Cr 1.33 on 12/22   Encourage fluids   Will need to be mindful of tumor lysis syndrome    Cont to monitor 21. Leukocytosis   UA equivocal, urine culture pending   CXR reviewed, showing improvement   Cont to monitor 22. Thrombocytopenia   Platelets 124 on 12/22   Continue to monitor  23. Postinfusion fluid overload   Lasix 40 daily started on 12/22  LOS (Days) 4 A TO FACE EVALUATION WAS PERFORMED  Rye Decoste Lorie Phenix 12/13/2017 8:05 AM

## 2017-12-13 NOTE — Progress Notes (Signed)
IR aware of request for port placement for lymphoma. Currently has PICC line. Chart reviewed. Pt on chronic Coumadin for Afib. Last dose was 12/21 ~1900 INR today 2.96.  Coumadin will need to be held several days to allow INR to come down to ~1.5 or less. IR will follow along and schedule accordingly. Alternatively, PICC line could be kept for access and pt can be scheduled for outpt port placement.  Ascencion Dike PA-C Interventional Radiology 12/13/2017 12:02 PM

## 2017-12-13 NOTE — Progress Notes (Signed)
Occupational Therapy Session Note  Patient Details  Name: Colleen [name] MRN: [id number] Date of Birth: 11-24-1935  Today's Date: 12/13/2017  Session 1 OT Individual Time: 0705-0800 OT Individual Time Calculation (min): 55 min   Session 2 OT Individual Time: 6945-0388 OT Individual Time Calculation (min): 43 min    Short Term Goals: Week 1:  OT Short Term Goal 1 (Week 1): Pt will maintain dynamic sitting balance during selfcare tasks with no more than min assist when sitting unsupported. OT Short Term Goal 2 (Week 1): Pt will complete UB bathing with min assist using LH sponge.  OT Short Term Goal 3 (Week 1): Pt will perform LB bathing sit to stand with mod assist using AE PRN.  OT Short Term Goal 4 (Week 1): Pt will toleerate AAROM of the RUE shoulder flexion 0-90 degrees for greater functional use with selfcare tasks.   OT Short Term Goal 5 (Week 1): Pt will perform toilet transfer with mod assist stand pivot to the 3:1  Skilled Therapeutic Interventions/Progress Updates:    Session 1 OT treatment session focused on R UE ROM, sit<>stand, standing endurance, and modified grooming tasks. Pt came to side-lying with min A to roll to R. R UE gentle ROM in gravity eliminated side-lying position with pt able to tolerate slight shoulder FF. With OT supporting UE, worked on  Shoulder flex/ext, elbow flex/ext, wrist flex/ext, and finger flex/ext. Pt came to sitting EOB with max A to elevate trunk. Sit<>stand in [place] with bed elevated and Mod A. Pt brought to the sink and educated on functional use of R UE as pt has good grasp in R hand to stabilize toothbrush while adding toothpaste. Pt left seated at end of session in wc with needs met and call bell in reach.  Session 2 Addressed self-feeding seated in recliner with education provided for use of R hand to stabilize bowl while using utensil with L hand. Painful ROM of shoulder, but with shoulder supported, pt able to grasp items and assist with  self-feeding task. Provided pt with foam for hand there-ex and completed 3 sets/10 and educated on R UE self-ROM. Unable to tolerate any shoulder movement this afternoon w/w pain.  2 sets of 10 LB there-ex knee extension and hip extension. Worked on reciprocal scooting in recliner to scoot towards edge of recliner, then back. Pt left semi-reclined in recliner with family present and needs met.  See Function Navigator for Current Functional Status.   Therapy/Group: Individual Therapy  Valma Cava 12/13/2017, 1:21 PM

## 2017-12-13 NOTE — Progress Notes (Signed)
  Echocardiogram 2D Echocardiogram with definity has been performed.  Calani, Gick M 12/13/2017, 10:52 AM

## 2017-12-14 LAB — COMPREHENSIVE METABOLIC PANEL
ALK PHOS: 106 U/L (ref 38–126)
ALT: 43 U/L (ref 14–54)
ANION GAP: 9 (ref 5–15)
AST: 54 U/L — ABNORMAL HIGH (ref 15–41)
Albumin: 2.3 g/dL — ABNORMAL LOW (ref 3.5–5.0)
BILIRUBIN TOTAL: 0.8 mg/dL (ref 0.3–1.2)
BUN: 42 mg/dL — ABNORMAL HIGH (ref 6–20)
CALCIUM: 10.9 mg/dL — AB (ref 8.9–10.3)
CO2: 25 mmol/L (ref 22–32)
Chloride: 101 mmol/L (ref 101–111)
Creatinine, Ser: 1.34 mg/dL — ABNORMAL HIGH (ref 0.44–1.00)
GFR, EST AFRICAN AMERICAN: 42 mL/min — AB (ref >60)
GFR, EST NON AFRICAN AMERICAN: 36 mL/min — AB (ref >60)
Glucose, Bld: 75 mg/dL (ref 65–99)
POTASSIUM: 4.3 mmol/L (ref 3.5–5.1)
Sodium: 135 mmol/L (ref 135–145)
TOTAL PROTEIN: 4.8 g/dL — AB (ref 6.5–8.1)

## 2017-12-14 LAB — PROTIME-INR
INR: 2.16
PROTHROMBIN TIME: 23.9 s — AB (ref 11.4–15.2)

## 2017-12-14 LAB — CBC WITH DIFFERENTIAL/PLATELET
BASOS ABS: 0 10*3/uL (ref 0.0–0.1)
BASOS PCT: 0 %
EOS ABS: 0.2 10*3/uL (ref 0.0–0.7)
EOS PCT: 3 %
HCT: 31.2 % — ABNORMAL LOW (ref 36.0–46.0)
Hemoglobin: 10.4 g/dL — ABNORMAL LOW (ref 12.0–15.0)
LYMPHS ABS: 0.5 10*3/uL — AB (ref 0.7–4.0)
Lymphocytes Relative: 6 %
MCH: 34.6 pg — AB (ref 26.0–34.0)
MCHC: 33.3 g/dL (ref 30.0–36.0)
MCV: 103.7 fL — ABNORMAL HIGH (ref 78.0–100.0)
Monocytes Absolute: 0.8 10*3/uL (ref 0.1–1.0)
Monocytes Relative: 9 %
NEUTROS PCT: 82 %
Neutro Abs: 6.9 10*3/uL (ref 1.7–7.7)
PLATELETS: 155 10*3/uL (ref 150–400)
RBC: 3.01 MIL/uL — AB (ref 3.87–5.11)
RDW: 15.9 % — ABNORMAL HIGH (ref 11.5–15.5)
WBC: 8.4 10*3/uL (ref 4.0–10.5)

## 2017-12-14 LAB — LACTATE DEHYDROGENASE: LDH: 527 U/L — AB (ref 98–192)

## 2017-12-14 LAB — URIC ACID: URIC ACID, SERUM: 5 mg/dL (ref 2.3–6.6)

## 2017-12-14 NOTE — Progress Notes (Signed)
[facility] RE[facility]ILITATION     PROGRESS NOTE  Subjective/Complaints:  Patient seen lying in bed this morning. She states she slept fairly overnight. She notes improvement in right upper extremity strength.  ROS: Denies CP, SOB, nausea, vomiting, diarrhea.  Objective: Vital Signs: Blood pressure (!) 134/55, pulse (!) 56, temperature 97.7 F (36.5 C), temperature source Oral, resp. rate 18, height 5\' 4"  (1.626 m), weight 94.9 kg (209 lb 3.5 oz), SpO2 97 %. Dg Chest 2 View  Result Date: 12/12/2017 CLINICAL DATA:  Follow-up evaluation for leukocytosis. Right arm swelling. History of CHF, hypertension, TIA, coronary artery disease, atrial fibrillation. EXAM: CHEST  2 VIEW COMPARISON:  Chest x-rays dated 12/11/2017 and 08/14/2015. Chest CT dated 11/29/2017. FINDINGS: Improved aeration within the right upper lung compared to yesterday's chest x-ray suggesting improved fluid status. No new lung findings. Cardiomediastinal silhouette is stable in size and configuration compared to yesterday's exam, compatible with the extensive mediastinal lymphadenopathy demonstrated on recent CT. Lateral view is suboptimal all. I cannot exclude small pleural effusion on the lateral view. IMPRESSION: 1. Improved aeration within the right upper lobe compared to yesterday's chest x-ray, likely indicating improved fluid status. Lungs otherwise clear. Questionable pleural effusion on the lateral projection. 2. Abnormal configuration of the cardiomediastinal silhouette, compatible with the extensive mediastinal and perihilar lymphadenopathy better demonstrated on recent CT chest of 11/29/2017. Electronically Signed   By: Franki Cabot M.D.   On: 12/12/2017 16:02   Recent Labs    12/12/17 0505 12/13/17 0519  WBC 13.0* 6.4  HGB 10.5* 9.0*  HCT 30.9* 27.1*  PLT 156 124*   Recent Labs    12/12/17 0505 12/13/17 0519  NA 133* 134*  K 5.1 4.4  CL 102 101  GLUCOSE 116* 104*  BUN 33* 42*  CREATININE  1.39* 1.33*  CALCIUM 12.0* 11.1*   CBG (last 3)  No results for input(s): GLUCAP in the last 72 hours.  Wt Readings from Last 3 Encounters:  12/14/17 94.9 kg (209 lb 3.5 oz)  12/08/17 88 kg (194 lb 0.1 oz)  11/28/17 73 kg (161 lb)    Physical Exam:  BP (!) 134/55 (BP Location: Right Leg)   Pulse (!) 56   Temp 97.7 F (36.5 C) (Oral)   Resp 18   Ht 5\' 4"  (1.626 m)   Wt 94.9 kg (209 lb 3.5 oz)   SpO2 97%   BMI 35.91 kg/m  Constitutional: NAD. Obese  HENT: Normocephalic. Atraumatic Eyes: EOM are normal. No discharge.  Cardiovascular: RRR. No JVD.  Respiratory: No respiratory distress. Clear to auscultation  GI: Bowel sounds are normal. She exhibits no distension.  Musculoskeletal: Edema: RUE, improving  Neurological: She isalertand oriented. Motor: RUE: Shoulder abduction 1/5, elbow flex/extension 2-/5, hand grip 3+/5  LUE: 5/5 proximal to distal B/L LE:HF 4/5, KE 4/5, ADF/PF 4+/5.  Skin. Warm and dry Psychiatric: She has a normal mood and affect. Her behavior is normal. Judgment and thought content normal.   Assessment/Plan: 1. Functional deficits secondary to diffuse large B-cell non-Hodgkin's lymphoma which require 3+ hours per day of interdisciplinary therapy in a comprehensive inpatient rehab setting. Physiatrist is providing close team supervision and 24 hour management of active medical problems listed below. Physiatrist and rehab team continue to assess barriers to discharge/monitor patient progress toward functional and medical goals.  Function:  Bathing Bathing position   Position: Wheelchair/chair at sink  Bathing parts Body parts bathed by patient: Chest, Abdomen, Right upper leg, Left upper leg, Right  arm Body parts bathed by helper: Left arm, Right lower leg, Left lower leg, Back  Bathing assist Assist Level: (ModA)   Set up : To open containers, To obtain items  Upper Body Dressing/Undressing Upper body dressing   What is the patient wearing?: Pull  over shirt/dress       Pull over shirt/dress - Perfomed by helper: Thread/unthread right sleeve, Thread/unthread left sleeve, Put head through opening, Pull shirt over trunk        Upper body assist Assist Level: Touching or steadying assistance(Pt > 75%)      Lower Body Dressing/Undressing Lower body dressing   What is the patient wearing?: Hospital Gown, Non-skid slipper socks, Ted Hose           Non-skid slipper socks- Performed by helper: Don/doff right sock, Don/doff left sock               TED Hose - Performed by helper: Don/doff left TED hose, Don/doff right TED hose  Lower body assist        Toileting Toileting Toileting activity did not occur: No continent bowel/bladder event   Toileting steps completed by helper: Adjust clothing prior to toileting, Performs perineal hygiene, Adjust clothing after toileting    Toileting assist Assist level: Two helpers(per Elmo Putt, NT report)   Transfers Chair/bed transfer   Chair/bed transfer method: Other Chair/bed transfer assist level: 2 helpers Chair/bed transfer assistive device: Mechanical lift Mechanical lift: Ecologist     Max distance: 4 ft Assist level: Touching or steadying assistance (Pt > 75%)   Wheelchair   Type: Manual Max wheelchair distance: (10 ft in room) Assist Level: Moderate assistance (Pt 50 - 74%)  Cognition Comprehension Comprehension assist level: Understands basic 90% of the time/cues < 10% of the time  Expression Expression assist level: Expresses basic needs/ideas: With extra time/assistive device  Social Interaction Social Interaction assist level: Interacts appropriately 90% of the time - Needs monitoring or encouragement for participation or interaction.  Problem Solving Problem solving assist level: Solves basic 75 - 89% of the time/requires cueing 10 - 24% of the time  Memory Memory assist level: Recognizes or recalls 75 - 89% of the time/requires cueing  10 - 24% of the time     Medical Problem List and Plan: 1.  Decreased functional mobility secondary to diffuse large B-cell non-Hodgkin's lymphoma.   Continue CIR    Rituximab weekly initiated next infusion 12/27, plan for second round of steroids on 12/28.   Plan CHOP with infusional doxorubicin as an outpatient per Heme/Onc 2.  DVT Prophylaxis/Anticoagulation:  Monitor for any bleeding episodes   Coumadin currently on hold, INR 2.96 on 12/22, will need to be around 1.5 for [facility] placement 3. Pain Management with brachial plexus impingement due to lymphadenopathy             Neurontin 300 mg 3 times a day, oxycodone as needed 4. Mood: Provide emotional support 5. Neuropsych: This patient is capable of making decisions on her own behalf. 6. Skin/Wound Care: Routine skin checks 7. Fluids/Electrolytes/Nutrition: Routine I&O's  8. Atrial fibrillation. Continue Tambocor 100 mg twice a day, Lopressor 12.5 mg twice a day   Controlled on 12/23 9. Hypertension. Lisinopril 20 mg daily. Monitor with increased mobility   Slightly labile, will continue to monitor for trend 10. Diastolic congestive heart failure. Monitor for any signs of fluid overload. Weigh patient daily Filed Weights   12/11/17 0500 12/12/17 0500 12/14/17 0332  Weight:  101.4 kg (223 lb 8.7 oz) 97.1 kg (214 lb 1.1 oz) 94.9 kg (209 lb 3.5 oz)    ?Reliability   Last Echo 2016, Reordered, completed, results pending 11. Hypothyroidism. Synthroid 12. Gout. Allopurinol 300 mg daily. Monitor for any gout flareups 13. Constipation. Laxative assistance 14. RUE lymphedema: see #3, continue elevation, massage, edema mgt 15. Hypoalbuminemia   Supplement initiated on 12/19 16. Acute blood loss anemia   Hemoglobin 9.0 on 12/22   Continue to monitor 17. 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 Hyponatremia   Na 134 on  12/22   Cont to monitor 20. AKI   Cr 1.33 on 12/22   Encourage fluids   Will need to be mindful of tumor lysis syndrome    Cont to monitor 21. Leukocytosis   UA equivocal, urine culture pending   CXR reviewed, showing improvement   Cont to monitor 22. Thrombocytopenia   Platelets 124 on 12/22   Continue to monitor  23. Postinfusion fluid overload   Lasix 40 daily started on 12/22  LOS (Days) 5 A TO FACE EVALUATION WAS PERFORMED  Ankit Lorie Phenix 12/14/2017 8:02 AM

## 2017-12-14 NOTE — Plan of Care (Signed)
  Not Progressing RH BOWEL ELIMINATION RH STG MANAGE BOWEL WITH ASSISTANCE Description STG Manage Bowel with min  Assistance.  12/14/2017 1620 - Not Progressing by Brita Romp, RN RH BLADDER ELIMINATION RH STG MANAGE BLADDER WITH ASSISTANCE Description STG Manage Bladder With min Assistance  12/14/2017 1620 - Not Progressing by Brita Romp, RN RH STG MANAGE BLADDER WITH EQUIPMENT WITH ASSISTANCE Description STG Manage Bladder With Equipment With min  Assistance  12/14/2017 1620 - Not Progressing by Brita Romp, RN RH SKIN INTEGRITY RH STG SKIN FREE OF INFECTION/BREAKDOWN Description Patient will be free of new breakdown prior to discharge  12/14/2017 1620 - Not Progressing by Brita Romp, RN RH STG MAINTAIN SKIN INTEGRITY WITH ASSISTANCE Description STG Maintain Skin Integrity With min  Assistance.  12/14/2017 1620 - Not Progressing by Brita Romp, RN RH STG ABLE TO PERFORM INCISION/WOUND CARE W/ASSISTANCE Description STG Able To Perform Incision/Wound Care With  [place].  12/14/2017 1620 - Not Progressing by Brita Romp, RN

## 2017-12-15 ENCOUNTER — Other Ambulatory Visit: Payer: Self-pay

## 2017-12-15 ENCOUNTER — Inpatient Hospital Stay (HOSPITAL_COMMUNITY): Payer: Medicare Other | Admitting: Occupational Therapy

## 2017-12-15 ENCOUNTER — Inpatient Hospital Stay (HOSPITAL_COMMUNITY): Payer: Medicare Other

## 2017-12-15 DIAGNOSIS — B009 Herpesviral infection, unspecified: Secondary | ICD-10-CM

## 2017-12-15 DIAGNOSIS — R339 Retention of urine, unspecified: Secondary | ICD-10-CM

## 2017-12-15 DIAGNOSIS — C8581 Other specified types of non-Hodgkin lymphoma, lymph nodes of head, face, and neck: Secondary | ICD-10-CM

## 2017-12-15 DIAGNOSIS — R599 Enlarged lymph nodes, unspecified: Secondary | ICD-10-CM

## 2017-12-15 LAB — CBC
HCT: 32.2 % — ABNORMAL LOW (ref 36.0–46.0)
Hemoglobin: 10.4 g/dL — ABNORMAL LOW (ref 12.0–15.0)
MCH: 34.6 pg — AB (ref 26.0–34.0)
MCHC: 32.3 g/dL (ref 30.0–36.0)
MCV: 107 fL — ABNORMAL HIGH (ref 78.0–100.0)
PLATELETS: 162 10*3/uL (ref 150–400)
RBC: 3.01 MIL/uL — AB (ref 3.87–5.11)
RDW: 16.5 % — ABNORMAL HIGH (ref 11.5–15.5)
WBC: 8.7 10*3/uL (ref 4.0–10.5)

## 2017-12-15 LAB — PROTIME-INR
INR: 1.69
PROTHROMBIN TIME: 19.8 s — AB (ref 11.4–15.2)

## 2017-12-15 MED ORDER — CEFAZOLIN SODIUM-DEXTROSE 2-4 GM/100ML-% IV SOLN: 2.0000 g | INTRAVENOUS | Status: DC

## 2017-12-15 NOTE — Progress Notes (Signed)
Occupational Therapy Session Note  Patient Details  Name: Colleen [name] MRN: [id number] Date of Birth: May 31, 1935  Today's Date: 12/15/2017 OT Individual Time: 5809-9833 OT Individual Time Calculation (min): 58 min    Short Term Goals: Week 1:  OT Short Term Goal 1 (Week 1): Pt will maintain dynamic sitting balance during selfcare tasks with no more than min assist when sitting unsupported. OT Short Term Goal 2 (Week 1): Pt will complete UB bathing with min assist using LH sponge.  OT Short Term Goal 3 (Week 1): Pt will perform LB bathing sit to stand with mod assist using AE PRN.  OT Short Term Goal 4 (Week 1): Pt will toleerate AAROM of the RUE shoulder flexion 0-90 degrees for greater functional use with selfcare tasks.   OT Short Term Goal 5 (Week 1): Pt will perform toilet transfer with mod assist stand pivot to the 3:1  Skilled Therapeutic Interventions/Progress Updates:    Pt completed bathing and dressing from bedside chair this session.  Mod assist for UB bathing with total assist +2 (pt 30%) for LB.  She was unable to reach her RUE efficiently for bathing the left arm and could not tolerate moving the left to wash more than just her hands and wrist area.  Feel she would benefit from trial of AE for washing in general.  She could not reach down below her shins for washing or dressing tasks.  Severe lean to the left and posteriorly during bathing.  Overall needed max assist for dynamic sitting balance, with rounded shoulders and posterior pelvic tilt, as well as head tilt to the right with right shoulder depressing.  Max assist for all dressing, including pullover gown.  Oxygen sats 95% on 3Ls during session.  Once finished, pt was left in the bed side chair with call button and phone in reach.    Therapy Documentation Precautions:  Precautions Precautions: Fall, Other (comment) Precaution Comments: Chemo; BP only in LE; incontinent with mobility; R UE  pain/lymphedema Restrictions Weight Bearing Restrictions: No   Pain: Pain Assessment Pain Assessment: Faces Faces Pain Scale: Hurts little more Pain Type: Acute pain Pain Location: Arm Pain Orientation: Right Pain Descriptors / Indicators: Aching Pain Onset: With Activity Pain Intervention(s): Repositioned ADL: See Function Navigator for Current Functional Status.   Therapy/Group: Individual Therapy  Kurstyn Larios OTR/L 12/15/2017, 12:24 PM

## 2017-12-15 NOTE — Progress Notes (Signed)
Physical Therapy Session Note  Patient Details  Name: Colleen [name] MRN: [id number] Date of Birth: 02/01/1935  Today's Date: 12/15/2017 PT Individual Time: 9030-0923 PT Individual Time Calculation (min): 72 min   Short Term Goals: Week 1:  PT Short Term Goal 1 (Week 1): Pt will perform sit to stand with least restrictive device from w/c with cushion with max A x 1 person. PT Short Term Goal 2 (Week 1): Pt will transfer bed to chair with Max A x 1 person and least restictive device. PT Short Term Goal 3 (Week 1): Pt will ambulate 20 ft with RW and min A x 1.  Skilled Therapeutic Interventions/Progress Updates:    Pt seated in recliner upon PT arrival, agreeable to therapy tx and reports pain 5/10 in R UE at rest, made worse with movement. Pt performed sit<>stand within the stedy from recliner, requiring Max assist +2 to boost up into standing. Beginning of the session focused on sit<>stands within the stedy and standing tolerance. Pt performed x 3 sit<>stands from elevated surface (stedy seat) with max assist, verbal cues for technique. Increased weakness noted in L UE today with decreased ability to pull up using L UE. Pt able to stand for bouts 45sec-1.5 min, x 3 trials with verbal and tactile cues for hip extension and trunk extension. Pt unable to tolerate further standing secondary to fatigue. Seated therex with cues for technique:  Marching 3x10 LAQ 2x10 with 5 sec hold 2 x 10 Hip abduction with orange TB 5 sec hold Ankle pumps 3x10 LUE shoulder raise x10 L UE bicep curls with 2# weight x 10 Hip ADD squeeze with ball 2x10 Glute sets 2x10 Ankle plantar flexion with orange TB 2 x 10 Increased time and rest breaks between exercises secondary to fatigue.   Pt left seated in recliner at end of session with needs in reach.     Therapy Documentation Precautions:  Precautions Precautions: Fall, Other (comment) Precaution Comments: Chemo; BP only in LE; incontinent with mobility; R  UE pain/lymphedema Restrictions Weight Bearing Restrictions: No   See Function Navigator for Current Functional Status.   Therapy/Group: Individual Therapy  Netta Corrigan, PT, DPT 12/15/2017, 7:56 AM

## 2017-12-15 NOTE — Progress Notes (Addendum)
[place] [facility]ICAL MEDICINE & REHABILITATION     PROGRESS NOTE  Subjective/Complaints:  Patient seen sitting up in her chair this morning. She states she slept well overnight after receiving pain medications.   ROS: Denies CP, SOB, nausea, vomiting, diarrhea.  Objective: Vital Signs: Blood pressure 134/62, pulse 62, temperature 98.5 F (36.9 C), temperature source Oral, resp. rate 18, height 5\' 4"  (1.626 m), weight 94.2 kg (207 lb 10.8 oz), SpO2 97 %. No results found. Recent Labs    12/14/17 1212 12/15/17 0600  WBC 8.4 8.7  HGB 10.4* 10.4*  HCT 31.2* 32.2*  PLT 155 162   Recent Labs    12/13/17 0519 12/14/17 1212  NA 134* 135  K 4.4 4.3  CL 101 101  GLUCOSE 104* 75  BUN 42* 42*  CREATININE 1.33* 1.34*  CALCIUM 11.1* 10.9*   CBG (last 3)  No results for input(s): GLUCAP in the last 72 hours.  Wt Readings from Last 3 Encounters:  12/15/17 94.2 kg (207 lb 10.8 oz)  12/08/17 88 kg (194 lb 0.1 oz)  11/28/17 73 kg (161 lb)    Physical Exam:  BP 134/62 (BP Location: Right Leg)   Pulse 62   Temp 98.5 F (36.9 C) (Oral)   Resp 18   Ht 5\' 4"  (1.626 m)   Wt 94.2 kg (207 lb 10.8 oz)   SpO2 97%   BMI 35.65 kg/m  Constitutional: NAD. Obese  HENT: Normocephalic. Atraumatic Eyes: EOM are normal. No discharge.  Cardiovascular: RRR. No JVD.  Respiratory: No respiratory distress. Clear to auscultation  GI: Bowel sounds are normal. She exhibits no distension.  Musculoskeletal: Edema: RUE, improving  Neurological: She isalertand oriented. Motor: RUE: Shoulder abduction 1/5, elbow flex/extension 2-/5, hand grip 3+/5 (slowly improving) LUE: 5/5 proximal to distal B/L LE:HF 4/5, KE 4/5, ADF/PF 4+/5.  Skin. Warm and dry Psychiatric: She has a normal mood and affect. Her behavior is normal. Judgment and thought content normal.   Assessment/Plan: 1. Functional deficits secondary to diffuse large B-cell non-Hodgkin's lymphoma which require 3+ hours per day of  interdisciplinary therapy in a comprehensive inpatient rehab setting. Physiatrist is providing close team supervision and 24 hour management of active medical problems listed below. Physiatrist and rehab team continue to assess barriers to discharge/monitor patient progress toward functional and medical goals.  Function:  Bathing Bathing position   Position: Wheelchair/chair at sink  Bathing parts Body parts bathed by patient: Chest, Abdomen, Right upper leg, Left upper leg, Right arm Body parts bathed by helper: Left arm, Right lower leg, Left lower leg, Back  Bathing assist Assist Level: (ModA)   Set up : To open containers, To obtain items  Upper Body Dressing/Undressing Upper body dressing   What is the patient wearing?: Pull over shirt/dress       Pull over shirt/dress - Perfomed by helper: Thread/unthread right sleeve, Thread/unthread left sleeve, Put head through opening, Pull shirt over trunk        Upper body assist Assist Level: Touching or steadying assistance(Pt > 75%)      Lower Body Dressing/Undressing Lower body dressing   What is the patient wearing?: Hospital Gown, Non-skid slipper socks, Ted Hose           Non-skid slipper socks- Performed by helper: Don/doff right sock, Don/doff left sock               TED Hose - Performed by helper: Don/doff left TED hose, Don/doff right TED hose  Lower body  assist        Toileting Toileting Toileting activity did not occur: No continent bowel/bladder event   Toileting steps completed by helper: Adjust clothing prior to toileting, Performs perineal hygiene, Adjust clothing after toileting    Toileting assist Assist level: Two helpers(per Elmo Putt, NT report)   Transfers Chair/bed transfer   Chair/bed transfer method: Other Chair/bed transfer assist level: 2 helpers Chair/bed transfer assistive device: Mechanical lift Mechanical lift: Ecologist     Max distance: 4 ft Assist  level: Touching or steadying assistance (Pt > 75%)   Wheelchair   Type: Manual Max wheelchair distance: (10 ft in room) Assist Level: Moderate assistance (Pt 50 - 74%)  Cognition Comprehension Comprehension assist level: Understands basic 90% of the time/cues < 10% of the time  Expression Expression assist level: Expresses basic needs/ideas: With extra time/assistive device  Social Interaction Social Interaction assist level: Interacts appropriately 90% of the time - Needs monitoring or encouragement for participation or interaction.  Problem Solving Problem solving assist level: Solves basic 75 - 89% of the time/requires cueing 10 - 24% of the time  Memory Memory assist level: Recognizes or recalls 75 - 89% of the time/requires cueing 10 - 24% of the time     Medical Problem List and Plan: 1.  Decreased functional mobility secondary to diffuse large B-cell non-Hodgkin's lymphoma.   Continue CIR    Rituximab weekly initiated next infusion 12/27, plan for second round of steroids on 12/28.   Plan CHOP with infusional doxorubicin as an outpatient per Heme/Onc 2.  DVT Prophylaxis/Anticoagulation:  Monitor for any bleeding episodes   Coumadin currently on hold, INR 1.69 on 12/24, VIR note reviewed, will discuss with VIR, plan to reevaluate 3. Pain Management with brachial plexus impingement due to lymphadenopathy             Neurontin 300 mg 3 times a day, oxycodone as needed 4. Mood: Provide emotional support 5. Neuropsych: This patient is capable of making decisions on her own behalf. 6. Skin/Wound Care: Routine skin checks 7. Fluids/Electrolytes/Nutrition: Routine I&O's  8. Atrial fibrillation. Continue Tambocor 100 mg twice a day, Lopressor 12.5 mg twice a day   Controlled on 12/24 9. Hypertension. Lisinopril 20 mg daily. Monitor with increased mobility   Slightly labile, will continue to monitor for trend 10. Diastolic congestive heart failure. Monitor for any signs of fluid  overload. Weigh patient daily Filed Weights   12/12/17 0500 12/14/17 0332 12/15/17 0557  Weight: 97.1 kg (214 lb 1.1 oz) 94.9 kg (209 lb 3.5 oz) 94.2 kg (207 lb 10.8 oz)    ?Reliability   Last Echo 2016, Reordered, completed, relatively stable, with no significant changes 11. Hypothyroidism. Synthroid 12. Gout. Allopurinol 300 mg daily. Monitor for any gout flareups 13. Constipation. Laxative assistance 14. RUE lymphedema: see #3, continue elevation, massage, edema mgt 15. Hypoalbuminemia   Supplement initiated on 12/19 16. Acute blood loss anemia   Hemoglobin 10.4 on 12/24   Continue to monitor 17. 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 Hyponatremia: Resolved   Na 135 on 12/23   Cont to monitor 20. AKI   Cr 1.34 on 12/23   Encourage fluids   Will need to be mindful of tumor lysis syndrome    Cont to monitor 21. Leukocytosis   UA equivocal, urine culture  with multiple species   CXR reviewed, showing improvement   Cont to monitor 22. Thrombocytopenia: Resolved   Platelets 162 on 12/24   Continue to monitor  23. Postinfusion fluid overload   Lasix 40 daily started on 12/22 24. Urinary retention   Will DC Foley, voiding trial  LOS (Days) 6 A TO FACE EVALUATION WAS PERFORMED  Clayton Jarmon Lorie Phenix 12/15/2017 9:31 AM

## 2017-12-15 NOTE — Consult Note (Signed)
Chief Complaint: Patient was seen in consultation today for [facility] a cath placement at the request of Dr Shelda Pal  Referring Physician(s): Dr Hulda Humphrey  Supervising Physician: Markus Daft  Patient Status: [facility] - In-pt  History of Present Illness: Colleen [name] is a 82 y.o. female   Diffuse large cell B-cell non-Hodgkin's lymphoma, high-grade, diagnosed by lymph node biopsy December 2018 Request for [facility] a Cath placement per Dr Jana Hakim Coumadin- Afib LD 12/21 INR 1.69 today  Possible PAC placement today Now npo Did eat small Breakfast this am 800 am    Past Medical History:  Diagnosis Date  . Atrial fibrillation ([place])   . Carotid artery disease ([place])   . CHF (congestive heart failure) ([place])   . Hyperlipidemia   . Hypertension   . Hypothyroidism   . Shortness of breath   . TIA (transient ischemic attack) 2001    Past Surgical History:  Procedure Laterality Date  . ABDOMINAL HYSTERECTOMY  1975   TAH,BSO  . CARDIAC CATHETERIZATION N/A 08/14/2015   Procedure: Right Heart Cath;  Surgeon: Larey Dresser, MD;  Location: [place] CV [facility];  Service: Cardiovascular;  Laterality: N/A;  . carotid duplex  11/24/2012  . carotid duplex  11/01/2010  . CAROTID ENDARTERECTOMY  2001   left  . CHOLECYSTECTOMY    . DOPPLER ECHOCARDIOGRAPHY  02/13/2010   EF>55%  . DOPPLER ECHOCARDIOGRAPHY  09/29/2007  . NM MYOVIEW LTD  02/13/2010  . renal duplex  02/13/2010  . VIDEO BRONCHOSCOPY Bilateral 08/09/2015   Procedure: VIDEO BRONCHOSCOPY WITHOUT FLUORO;  Surgeon: Collene Gobble, MD;  Location: [place];  Service: Cardiopulmonary;  Laterality: Bilateral;    Allergies: Patient has no known allergies.  Medications: Prior to Admission medications   Medication Sig Start Date End Date Taking? Authorizing Provider  acetaminophen (TYLENOL) 500 MG tablet Take 1,000 mg by mouth every 6 (six) hours as needed for headache (pain).   Yes [provider]  allopurinol (ZYLOPRIM) 300  MG tablet Take 1 tablet (300 mg total) by mouth daily. 12/10/17  Yes Nita Sells, MD  Dextrose-Sodium Chloride (DEXTROSE 5 % AND 0.9% NACL) 5-0.9 % infusion Inject 75 mL/hr into the vein continuous. 12/09/17  Yes Nita Sells, MD  flecainide (TAMBOCOR) 100 MG tablet Take 1 tablet (100 mg total) by mouth 2 (two) times daily. 12/09/17  Yes Nita Sells, MD  gabapentin (NEURONTIN) 300 MG capsule Take 1 capsule (300 mg total) by mouth 3 (three) times daily. 12/09/17  Yes Nita Sells, MD  levothyroxine (SYNTHROID, LEVOTHROID) 88 MCG tablet Take 88 mcg by mouth daily before breakfast.    Yes [provider]  lisinopril (PRINIVIL,ZESTRIL) 20 MG tablet Take 1 tablet (20 mg total) by mouth 2 (two) times daily. Patient taking differently: Take 20 mg by mouth daily.  04/09/17  Yes Lorretta Harp, MD  metoprolol tartrate (LOPRESSOR) 25 MG tablet Take 0.5 tablets (12.5 mg total) by mouth 2 (two) times daily. 12/09/17  Yes Nita Sells, MD  oxyCODONE 10 MG TABS Take 1 tablet (10 mg total) by mouth every 4 (four) hours as needed for moderate pain. 12/09/17  Yes Nita Sells, MD  senna-docusate (SENOKOT-S) 8.6-50 MG tablet Take 1 tablet by mouth at bedtime. 12/09/17  Yes Nita Sells, MD  warfarin (COUMADIN) 5 MG tablet Take 2.5-5 mg by mouth See admin instructions. Take 1 tablet (5 mg) by mouth on Monday and Friday after supper, take 1/2 tablet (2.5 mg) on Sunday, Tuesday, Wednesday, Thursday, Saturday with  supper   Yes [provider]     Family History  Problem Relation Age of Onset  . Heart failure Mother   . Parkinson's disease Mother   . Arthritis Mother   . Arthritis Sister   . Colon cancer Brother   . Rheumatologic disease Neg Hx   . Lung disease Neg Hx     Social History   Socioeconomic History  . Marital status: Married    Spouse name: None  . Number of children: 2  . Years of education: None  . Highest education  level: None  Social Needs  . Financial resource strain: None  . Food insecurity - worry: None  . Food insecurity - inability: None  . Transportation needs - medical: None  . Transportation needs - non-medical: None  Occupational History  . Occupation: retired  Tobacco Use  . Smoking status: Former Smoker    Packs/day: 1.00    Years: 47.00    Pack years: 47.00    Types: Cigarettes    Last attempt to quit: 12/24/1999    Years since quitting: 17.9  . Smokeless tobacco: Never Used  Substance and Sexual Activity  . Alcohol use: Yes    Alcohol/week: 0.0 oz    Comment: 1 glass of wine rarely  . Drug use: No  . Sexual activity: None  Other Topics Concern  . None  Social History Narrative   Originally she is from [place]. She has previously lived in [place] & moved to [place] in 1985. 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 No mold or hot tub exposure. No bird exposure.     Review of Systems: A 12 point ROS discussed and pertinent positives are indicated in the HPI above.  All other systems are negative.  Review of Systems  Constitutional: Positive for activity change, appetite change and fatigue. Negative for fever.  Cardiovascular: Negative for chest pain.  Gastrointestinal: Positive for nausea.  Musculoskeletal: Positive for gait problem.  Neurological: Positive for weakness.  Psychiatric/Behavioral: Negative for behavioral problems and confusion.    Vital Signs: BP 134/62 (BP Location: Right Leg)   Pulse 62   Temp 98.5 F (36.9 C) (Oral)   Resp 18   Ht 5\' 4"  (1.626 m)   Wt 207 lb 10.8 oz (94.2 kg)   SpO2 97%   BMI 35.65 kg/m   Physical Exam  Constitutional: She is oriented to person, place, and time.  Cardiovascular: Normal rate and regular rhythm.  Pulmonary/Chest: Effort normal and breath sounds normal.  Abdominal: Soft. Bowel sounds are normal.  Neurological: She is alert and oriented to person, place, and time.  Skin: Skin is warm and dry.    Psychiatric: She has a normal mood and affect. Her behavior is normal. Judgment and thought content normal.  Pt isn alert and oriented Consents for procedure but unable to sign secondary weakness Consented husband Ronalee Belts via phone  Nursing note and vitals reviewed.   Imaging: Dg Chest 2 View  Result Date: 12/12/2017 CLINICAL DATA:  Follow-up evaluation for leukocytosis. Right arm swelling. History of CHF, hypertension, TIA, coronary artery disease, atrial fibrillation. EXAM: CHEST  2 VIEW COMPARISON:  Chest x-rays dated 12/11/2017 and 08/14/2015. Chest CT dated 11/29/2017. FINDINGS: Improved aeration within the right upper lung compared to yesterday's chest x-ray suggesting improved fluid status. No new lung findings. Cardiomediastinal silhouette is stable in size and configuration compared to yesterday's exam, compatible with the extensive mediastinal lymphadenopathy demonstrated on recent CT. Lateral view  is suboptimal all. I cannot exclude small pleural effusion on the lateral view. IMPRESSION: 1. Improved aeration within the right upper lobe compared to yesterday's chest x-ray, likely indicating improved fluid status. Lungs otherwise clear. Questionable pleural effusion on the lateral projection. 2. Abnormal configuration of the cardiomediastinal silhouette, compatible with the extensive mediastinal and perihilar lymphadenopathy better demonstrated on recent CT chest of 11/29/2017. Electronically Signed   By: Franki Cabot M.D.   On: 12/12/2017 16:02   Ct Soft Tissue Neck Wo Contrast  Result Date: 11/29/2017 CLINICAL DATA:  82 y/o F; patient undergoing evaluation for lymphadenopathy with biopsy in the left subclavian area straightening, now presenting with swelling of the left arm and chest with associated severe pain. EXAM: CT NECK WITHOUT CONTRAST TECHNIQUE: Multidetector CT imaging of the neck was performed following the standard protocol without intravenous contrast. COMPARISON:  11/24/2017 CT  of the neck. FINDINGS: Pharynx and larynx: Normal. No mass or swelling. Salivary glands: No inflammation, mass, or stone. Thyroid: Normal. Lymph nodes: Interval progression of diffuse lymphadenopathy, for example a left level 5B lymph node measures 12 x 12 mm, previously 6 mm (series 3, image 88). Increased lymphadenopathy is best appreciated left posterior cervical chain and left upper axilla but increase in adenopathy is present diffusely, for example a right upper peritracheal node measuring 24 x 31 mm, previously 17 x 23 mm (series 3, image 121). Fat stranding surrounding conglomerate lymph nodes throughout the right cervical chain, right greater than left supraclavicular regions, and left axilla may represent infiltrative neoplasm or lymphedema. Vascular: Calcific atherosclerosis of the aorta and carotid siphons. Limited intracranial: Negative. Visualized orbits: Negative. Mastoids and visualized paranasal sinuses: Small left maxillary sinus mucous retention cyst. Otherwise negative. Skeleton: Stable mild cervical spondylosis. No high-grade bony canal stenosis. Upper chest: Emphysema and scarring in bilateral upper lobes is stable. Other: None. IMPRESSION: Interval progression of lymphadenopathy from 11/24/2017 best appreciated in the left cervical level 5B station, but measurable increase is present diffusely. Findings indicate rapidly progressive lymphoproliferative or metastatic disease. Correlation with biopsy is recommended. Electronically Signed   By: Kristine Garbe M.D.   On: 11/29/2017 22:54   Ct Soft Tissue Neck W Contrast  Result Date: 11/24/2017 CLINICAL DATA:  RIGHT neck and ear pain, enlarging RIGHT supraclavicular and retroauricular mass for 2 weeks. LEFT facial swelling. History of carotid artery surgery. EXAM: CT NECK WITH CONTRAST TECHNIQUE: Multidetector CT imaging of the neck was performed using the standard protocol following the bolus administration of intravenous contrast.  CONTRAST:  18mL ISOVUE-300 IOPAMIDOL (ISOVUE-300) INJECTION 61% COMPARISON:  CT chest September 11, 2015 FINDINGS: PHARYNX AND LARYNX: Normal.  Widely patent airway. SALIVARY GLANDS: Normal. THYROID: Normal. LYMPH NODES: Multilevel severe RIGHT cervical lymphadenopathy. 27 mm RIGHT level 1 B lymph node, round RIGHT level IIa 23 mm lymph node, 3.1 cm RIGHT supraclavicular lymph node with pericapsular fat stranding RIGHT neck lymphadenopathy. Mild LEFT neck lymphadenopathy including 11 mm intraparotid lymph nodes and 14 mm LEFT level IIa lymph node. VASCULAR: Normal. LIMITED INTRACRANIAL: Normal. VISUALIZED ORBITS: Surgical clips LEFT neck most compatible with endarterectomy. Severe calcific atherosclerosis RIGHT carotid bifurcation. Lymphadenopathy narrows RIGHT internal jugular vein which remains patent. MASTOIDS AND VISUALIZED PARANASAL SINUSES: Well-aerated. SKELETON: Nonacute. No destructive bony lesions. Patient is edentulous. UPPER CHEST: RIGHT apical bullous changes and scarring. Partially imaged bilateral hilar lymphadenopathy. Mediastinal lymphadenopathy including 2 cm aortopulmonary window lymph node, 17 mm pretracheal lymph node. LEFT greater than RIGHT axillary lymphadenopathy, at least 4 cm nodal conglomeration with LEFT retropectoral infiltrative  mass. Heart size is probably enlarged. Severe coronary artery calcifications. OTHER: IMPRESSION: 1. Severe RIGHT and mild LEFT neck lymphadenopathy. Severe LEFT axillary lymphadenopathy with lymph edema versus lymphangitic spread of tumor. LEFT retropectoral infiltrative mass. Mediastinal lymphadenopathy. Constellation of findings seen with lymphoproliferative disease, metastatic disease such as breast cancer given LEFT chest findings. Recommend CT chest with contrast versus PET-CT. 2. These results will be called to the ordering clinician or representative by the Radiologist Assistant, and communication documented in the PACS or zVision Dashboard. Aortic  Atherosclerosis (ICD10-I70.0). Electronically Signed   By: Elon Alas M.D.   On: 11/24/2017 20:19   Ct Chest W Contrast  Result Date: 11/29/2017 CLINICAL DATA:  Left-sided chest pain and swelling, recent biopsy on right supraclavicular lymph nodes EXAM: CT CHEST WITH CONTRAST TECHNIQUE: Multidetector CT imaging of the chest was performed during intravenous contrast administration. CONTRAST:  34mL ISOVUE-300 IOPAMIDOL (ISOVUE-300) INJECTION 61% COMPARISON:  11/24/2017 FINDINGS: Cardiovascular: Atherosclerotic changes of the thoracic aorta are noted without aneurysmal dilatation or dissection. Coronary calcifications are seen. No pulmonary emboli are noted. Some attenuation of the pulmonary arterial branches is noted secondary to hilar adenopathy. Additionally some displacement of the vasculature in the neck is noted particularly on the right also related to adenopathy. Mediastinum/Nodes: Thoracic inlet demonstrates evidence of significant right supraclavicular lymphadenopathy. The largest nodal mass measures approximately 10 by 4.9 cm and causes displacement of the adjacent vascular structures in the right neck. Additional adenopathy is noted in the anterior neck similar to that seen on prior examination. Significant mediastinal and hilar adenopathy is identified. The largest of these in the right hilum measures 2.3 cm in short axis. Subcarinal lymphadenopathy is noted measuring 16 mm in short axis. Prevascular adenopathy is noted measuring 2.2 cm in short axis. The esophagus is within normal limits. Considerable adenopathy is noted in the posterior mediastinum surrounding the descending aorta. The nodal mass measures approximately 2.9 by 3.9 cm. Considerable right axillary adenopathy is noted extending along the lateral chest wall. Lungs/Pleura: Emphysematous changes are noted in the lungs bilaterally. Mild scarring is seen. No focal parenchymal mass is noted. No sizable effusion is seen. Upper Abdomen:  Gallbladder has been surgically removed. The liver is within normal limits. The adrenal glands and visualized spleen are unremarkable. Musculoskeletal: T9 compression deformity is noted. Some lucency is noted within. It would be difficult to exclude some metastatic involvement given the findings throughout the chest. Degenerative changes of the thoracic spine are seen. No rib abnormality is noted. Considerable soft tissue mass is noted in the left anterior chest wall involving the pectoral muscles and subpectoral region. The overall size of the mass measures approximately 10.5 by 6.7 cm and Ing gallstone the subclavian and axillary artery is and veins. Significant skin thickening and edema is noted within the left breast. It would be difficult to exclude a portion of this representing a breast mass. There is apparent involvement of the chest wall although no bony destruction is seen. IMPRESSION: Diffuse lymphadenopathy involving the supraclavicular regions, mediastinum, bilateral hila as well as the axillary regions bilaterally as described above. Correlation with the recent biopsy results is recommended. It would be difficult to exclude a portion of the abnormality in the left chest wall to be related to the overlying breast given the degree of skin thickening and edema identified inferiorly. Alternatively this may represent an aggressive lymphoma. Further workup with PET-CT is recommended. T9 compression deformity with some lucencies noted within. This may be strictly related to the compression deformity although  the possibility of metastatic disease would deserve consideration as well. No other definitive bony abnormality is noted. Aortic Atherosclerosis (ICD10-I70.0) and Emphysema (ICD10-J43.9). Electronically Signed   By: Inez Catalina M.D.   On: 11/29/2017 20:01   Mr Neck Soft Tissue Only W Wo Contrast  Result Date: 11/30/2017 CLINICAL DATA:  Rapidly progressive lymphadenopathy, RIGHT arm swelling since  Thanksgiving. Suspected lymphoma. Status post biopsy, results pending. EXAM: MRI OF THE NECK WITH CONTRAST TECHNIQUE: Multiplanar, multisequence MR imaging was performed following the administration of intravenous contrast. CONTRAST:  77mL MULTIHANCE GADOBENATE DIMEGLUMINE 529 MG/ML IV SOLN COMPARISON:  CT neck November 29, 2017 and CT neck November 24, 2017 FINDINGS: Moderately motion degraded examination. PHARYNX AND LARYNX: Normal.  Widely patent airway. SALIVARY GLANDS: Intraparotid lymph nodes bilaterally measuring to 17 mm in deep lobe. No definite primary parotid mass though limited by motion. THYROID: Normal. LYMPH NODES: Severe RIGHT greater than LEFT cervical lymphadenopathy with intermediate to bright T2 signal and, mild enhancement. Lymphadenopathy all levels of the neck, at least 8.4 x 4.2 cm RIGHT supraclavicular nodal conglomeration. Lymphadenopathy abuts the scalene muscles impinges upon the RIGHT brachials plexus. Limited assessment of the brachium plexus due to motion. Massive LEFT retropectoral lymphadenopathy, lesser extent on the RIGHT. VASCULAR: Normal flow-voids. LIMITED INTRACRANIAL: Normal. VISUALIZED ORBITS: Normal. MASTOIDS AND VISUALIZED PARANASAL SINUSES: Well-aerated. SKELETON: No abnormal bone marrow signal though not tailored for evaluation. Limited assessment of spinal cord due to motion, no suspicious intracanalicular enhancement. UPPER CHEST: Lung apices are clear. No superior mediastinal lymphadenopathy. OTHER: Susceptibility artifact LEFT neck corresponding to use surgical clips. IMPRESSION: 1. Moderately motion degraded examination. 2. Bulky cervical and included chest lymphadenopathy with RIGHT brachial plexus impingement. Electronically Signed   By: Elon Alas M.D.   On: 11/30/2017 22:16   Dg Chest Port 1 View  Result Date: 12/11/2017 CLINICAL DATA:  Shortness of breath. EXAM: PORTABLE CHEST 1 VIEW COMPARISON:  11/29/2017 CT and chest radiograph. FINDINGS: Upper  limits normal heart size and mediastinal/hilar adenopathy again noted. This is a low volume film with mild bibasilar atelectasis. Equivocal slight increased streaky opacities within the right upper lobe noted. There is no evidence of pneumothorax or definite pleural effusion. IMPRESSION: Equivocal right upper lobe opacities which could represent early infection, focal edema or possibly aspiration. Radiographic follow-up recommended. Low volume film with mild bibasilar atelectasis. Unchanged mediastinal/hilar adenopathy again noted. Electronically Signed   By: Margarette Canada M.D.   On: 12/11/2017 17:48   Dg Chest Portable 1 View  Result Date: 11/29/2017 CLINICAL DATA:  Abnormal lymphadenopathy in the neck, chest swelling, initial encounter EXAM: PORTABLE CHEST 1 VIEW COMPARISON:  11/24/2017 FINDINGS: Cardiac shadow is within normal limits. Soft tissue mass lesion is again noted superimposed over the left hilum similar to that seen on recent CT of the neck. Fullness in the hilar regions is noted bilaterally likely representing some adenopathy. Aortic calcifications are seen. The lungs are clear. No bony abnormality is noted. IMPRESSION: Changes consistent with the known left upper lobe mass lesion as well as apparent hilar adenopathy particularly on the right. CT of the chest is recommended for further evaluation as previously described. Electronically Signed   By: Inez Catalina M.D.   On: 11/29/2017 19:10   [place] Core Biopsy (lymph Nodes)  Result Date: 12/02/2017 INDICATION: 82 year old female with presumed lymphoma. She presents for biopsy of nodal mass of the left axilla. EXAM: ULTRASOUND-GUIDED LYMPH NODE BIOPSY MEDICATIONS: None. ANESTHESIA/SEDATION: Moderate (conscious) sedation was employed during this procedure. A total of Versed  2.0 mg and Fentanyl 50 mcg was administered intravenously. Moderate Sedation Time: 10 minutes. The patient's level of consciousness and vital signs were monitored continuously by  radiology nursing throughout the procedure under my direct supervision. FLUOROSCOPY TIME:  None COMPLICATIONS: None PROCEDURE: Informed written consent was obtained from the patient after a thorough discussion of the procedural risks, benefits and alternatives. All questions were addressed. Maximal Sterile Barrier Technique was utilized including caps, mask, sterile gowns, sterile gloves, sterile drape, hand hygiene and skin antiseptic. A timeout was performed prior to the initiation of the procedure. Patient positioned supine position on the ultrasound stretcher. Ultrasound images of the left axillary region were performed with images stored and sent to PACs. The patient is prepped and draped in the usual sterile fashion. The skin and subcutaneous tissues were generously infiltrated 1% lidocaine for local anesthesia. Using ultrasound guidance, at least 8 separate 18 gauge core biopsy of the left axillary node were performed. Specimen placed in the saline. Final image was stored. Patient tolerated the procedure well and remained hemodynamically stable throughout. No complications were encountered and no significant blood loss. IMPRESSION: Status post ultrasound-guided biopsy of left axillary mass. Tissue specimen sent to pathology for complete histopathologic analysis. Signed, Dulcy Fanny. Earleen Newport, DO [facility] Radiology Electronically Signed   By: Corrie Mckusick D.O.   On: 12/02/2017 15:51   [place] Core Biopsy (lymph Nodes)  Result Date: 11/28/2017 INDICATION: 82 year old female with a clinical history of rapidly progressive right submental, cervical and supraclavicular lymphadenopathy. She presents today for urgent ultrasound-guided core biopsy. EXAM: ULTRASOUND GUIDED core needle BIOPSY OF right supraclavicular lymph node MEDICATIONS: None. ANESTHESIA/SEDATION: Fentanyl 100 mcg IV; Versed 2 mg IV Moderate Sedation Time:  15 minutes The patient was continuously  monitored during the procedure by the interventional radiology nurse under my direct supervision. PROCEDURE: The procedure, risks, benefits, and alternatives were explained to the patient. Questions regarding the procedure were encouraged and answered. The patient understands and consents to the procedure. The right submental region, the right cervical chain in the right supraclavicular lymph nodes or all interrogated with ultrasound. There are numerous enlarged, hypoechoic an abnormal appearing lymph nodes in each station. The most solid appearing lymph nodes are present in the right supraclavicular station. The right submental lymph node is slightly more cystic and may not yield viable cells. Therefore, the decision was made to proceed with biopsy of the right supraclavicular station lymph nodes. The right supraclavicular region was prepped with chlorhexidine in a sterile fashion, and a sterile drape was applied covering the operative field. A sterile gown and sterile gloves were used for the procedure. Local anesthesia was provided with 1% Lidocaine. A small dermatotomy was made. Under real-time sonographic guidance, numerous 18 gauge core biopsies were obtained of several lymph nodes in the right supraclavicular station. Biopsy specimens were placed in saline and delivered to pathology for further analysis. Post biopsy imaging demonstrates no evidence of hematoma or active bleeding. The patient tolerated the procedure well. There was no significant bleeding. COMPLICATIONS: None immediate. FINDINGS: Extensive hypoechoic lymphadenopathy throughout the right neck and right submental region. IMPRESSION: Technically successful ultrasound-guided core biopsy of right supraclavicular lymph nodes. Electronically Signed   By: Jacqulynn Cadet M.D.   On: 11/28/2017 09:04    Labs:  CBC: Recent Labs    12/12/17 0505 12/13/17 0519 12/14/17 1212 12/15/17 0600  WBC 13.0* 6.4 8.4 8.7  HGB 10.5* 9.0* 10.4* 10.4*    HCT 30.9* 27.1* 31.2* 32.2*  PLT 156 124* 155 162    COAGS: Recent Labs    11/28/17 0556  12/12/17 0505 12/13/17 0519 12/14/17 1212 12/15/17 0600  INR 1.91   < > 2.99 2.96 2.16 1.69  APTT 38*  --   --   --   --   --    < > = values in this interval not displayed.    BMP: Recent Labs    12/10/17 0616 12/12/17 0505 12/13/17 0519 12/14/17 1212  NA 136 133* 134* 135  K 4.1 5.1 4.4 4.3  CL 102 102 101 101  CO2 23 24 26 25   GLUCOSE 58* 116* 104* 75  BUN 11 33* 42* 42*  CALCIUM 11.5* 12.0* 11.1* 10.9*  CREATININE 0.81 1.39* 1.33* 1.34*  GFRNONAA >60 35* 37* 36*  GFRAA >60 40* 43* 42*    LIVER FUNCTION TESTS: Recent Labs    12/07/17 0540 12/08/17 0247 12/10/17 0616 12/14/17 1212  BILITOT 0.2* 0.5 0.6 0.8  AST 35 64* 63* 54*  ALT 26 53 68* 43  ALKPHOS 50 68 82 106  PROT 4.2* 4.7* 4.7* 4.8*  ALBUMIN 2.2* 2.4* 2.4* 2.3*    TUMOR MARKERS: No results for input(s): AFPTM, CEA, CA199, CHROMGRNA in the last 8760 hours.  Assessment and Plan:  Lymphoma Off Coumadin now 3 days INR 1.69 Possible PAC placement this afternoon Risks and benefits discussed with the patient and husband via phone-including, but not limited to bleeding, infection, pneumothorax, or fibrin sheath development and need for additional procedures. All of the patient's questions were answered, patient is agreeable to proceed. Consent signed and in chart.   Thank you for this interesting consult.  I greatly enjoyed meeting Colleen [name] and look forward to participating in their care.  A copy of this report was sent to the requesting provider on this date.  Electronically Signed: Lavonia Drafts, PA-C 12/15/2017, 10:02 AM   I spent a total of 40 Minutes    in face to face in clinical consultation, greater than 50% of which was counseling/coordinating care for [facility] placement

## 2017-12-15 NOTE — Progress Notes (Signed)
On call provider called concerning hypotension & pending Metoprolol administration. Verbal orders given to hold Metoprolol, encourage fluids & elevate the HOB. Will continue to monitor & communicated with other nurse on duty. Husband informed of conversation & interventions.

## 2017-12-15 NOTE — Progress Notes (Addendum)
Patient ID: Colleen [name], female   DOB: 03/06/1935, 82 y.o.   MRN: [id number]   INR 2.16 today Still to high for [facility] placement  Plan for 12/26 Will check INR   Addendum: INR 1.69 today Will ask Dr Anselm Pancoast if he will move forward at this level.

## 2017-12-15 NOTE — Progress Notes (Signed)
Patient ID: Colleen [name], female   DOB: 11-23-1935, 82 y.o.   MRN: [id number]   Dr Anselm Pancoast prefers to [facility] till INR wnl  Will plan for 12/26 Pt aware Orders in place

## 2017-12-15 NOTE — Progress Notes (Signed)
Occupational Therapy Session Note  Patient Details  Name: Colleen [name] MRN: [id number] Date of Birth: 01/29/1935  Today's Date: 12/15/2017 OT Individual Time: 0800-0850 OT Individual Time Calculation (min): 50 min    Short Term Goals: Week 1:  OT Short Term Goal 1 (Week 1): Pt will maintain dynamic sitting balance during selfcare tasks with no more than min assist when sitting unsupported. OT Short Term Goal 2 (Week 1): Pt will complete UB bathing with min assist using LH sponge.  OT Short Term Goal 3 (Week 1): Pt will perform LB bathing sit to stand with mod assist using AE PRN.  OT Short Term Goal 4 (Week 1): Pt will toleerate AAROM of the RUE shoulder flexion 0-90 degrees for greater functional use with selfcare tasks.   OT Short Term Goal 5 (Week 1): Pt will perform toilet transfer with mod assist stand pivot to the 3:1  Skilled Therapeutic Interventions/Progress Updates:   Pt presents supine in bed agreeable to OT tx session. Reports continued pain in RUE though has improved, pain subsides with rest/repositioning. Pt completes bed mobility with increased time to advance LEs over EOB, ModA for bringing trunk into upright position. Pt sits EOB to eat breakfast, requires increased time, and HOH to use RUE as gross stabilizer for opening containers, requiring overall ModA with breakfast setup. Pt sat EOB with intermittent MinA for maintaining upright position as Pt with occasional posterior lean. Pt completes sit<>stand at [facility] with ModA+2 and elevated bed to transfer to recliner. Pt left seated in recliner end of session with UEs elevated, call bell and needs within reach.   Therapy Documentation Precautions:  Precautions Precautions: Fall, Other (comment) Precaution Comments: Chemo; BP only in LE; incontinent with mobility; R UE pain/lymphedema Restrictions Weight Bearing Restrictions: No  See Function Navigator for Current Functional Status.   Therapy/Group: Individual  Therapy  Raymondo Band 12/15/2017, 12:17 PM

## 2017-12-16 DIAGNOSIS — R32 Unspecified urinary incontinence: Secondary | ICD-10-CM

## 2017-12-16 LAB — COMPREHENSIVE METABOLIC PANEL
ALT: 36 U/L (ref 14–54)
AST: 48 U/L — AB (ref 15–41)
Albumin: 2.4 g/dL — ABNORMAL LOW (ref 3.5–5.0)
Alkaline Phosphatase: 101 U/L (ref 38–126)
Anion gap: 13 (ref 5–15)
BUN: 56 mg/dL — AB (ref 6–20)
CHLORIDE: 99 mmol/L — AB (ref 101–111)
CO2: 24 mmol/L (ref 22–32)
CREATININE: 2.02 mg/dL — AB (ref 0.44–1.00)
Calcium: 11.1 mg/dL — ABNORMAL HIGH (ref 8.9–10.3)
GFR calc Af Amer: 26 mL/min — ABNORMAL LOW (ref >60)
GFR calc non Af Amer: 22 mL/min — ABNORMAL LOW (ref >60)
Glucose, Bld: 70 mg/dL (ref 65–99)
POTASSIUM: 4.7 mmol/L (ref 3.5–5.1)
SODIUM: 136 mmol/L (ref 135–145)
Total Bilirubin: 0.9 mg/dL (ref 0.3–1.2)
Total Protein: 5.1 g/dL — ABNORMAL LOW (ref 6.5–8.1)

## 2017-12-16 LAB — LACTATE DEHYDROGENASE: LDH: 539 U/L — AB (ref 98–192)

## 2017-12-16 LAB — CBC WITH DIFFERENTIAL/PLATELET
BASOS ABS: 0 10*3/uL (ref 0.0–0.1)
Basophils Relative: 0 %
Eosinophils Absolute: 0.1 10*3/uL (ref 0.0–0.7)
Eosinophils Relative: 1 %
HEMATOCRIT: 32.2 % — AB (ref 36.0–46.0)
HEMOGLOBIN: 10.3 g/dL — AB (ref 12.0–15.0)
LYMPHS PCT: 11 %
Lymphs Abs: 1.3 10*3/uL (ref 0.7–4.0)
MCH: 34.3 pg — ABNORMAL HIGH (ref 26.0–34.0)
MCHC: 32 g/dL (ref 30.0–36.0)
MCV: 107.3 fL — AB (ref 78.0–100.0)
MONO ABS: 0.1 10*3/uL (ref 0.1–1.0)
Monocytes Relative: 1 %
NEUTROS ABS: 10.4 10*3/uL — AB (ref 1.7–7.7)
NEUTROS PCT: 87 %
Platelets: 173 10*3/uL (ref 150–400)
RBC: 3 MIL/uL — ABNORMAL LOW (ref 3.87–5.11)
RDW: 16.4 % — AB (ref 11.5–15.5)
WBC: 11.8 10*3/uL — ABNORMAL HIGH (ref 4.0–10.5)

## 2017-12-16 LAB — PROTIME-INR
INR: 1.52
Prothrombin Time: 18.1 seconds — ABNORMAL HIGH (ref 11.4–15.2)

## 2017-12-16 LAB — URINALYSIS, COMPLETE (UACMP) WITH MICROSCOPIC
Bilirubin Urine: NEGATIVE
Glucose, UA: NEGATIVE mg/dL
Ketones, ur: NEGATIVE mg/dL
NITRITE: NEGATIVE
PH: 5 (ref 5.0–8.0)
Protein, ur: NEGATIVE mg/dL
SPECIFIC GRAVITY, URINE: 1.014 (ref 1.005–1.030)

## 2017-12-16 LAB — URIC ACID: Uric Acid, Serum: 5.3 mg/dL (ref 2.3–6.6)

## 2017-12-16 NOTE — Progress Notes (Signed)
Patient has had some congestion throughout the night. Before her husband left last night, he was concerned with her breathing. She was given a breathing treatment at that time & it seemed to be effective. Rhonchi heard in bil upper lobes, but patient seemed comfortable & slept throughout the night. In the morning, patient was noted with congestion again. She was given another breathing treatment, which seemed effective. She has a very weak cough. Patient also had not urinated throughout the night. She stated that she did not feel like she needed to urinate but the orders stated to do an in/out cath. Catheterization was performed but was a small amount. There was a small amount of what looked like discharge or drainage that could have been from the catheter removal at the end of the previous shift. Patient continues with lymphedema to BUE, with more to the right arm that is very painful. She has blisters forming to the right inner thigh & the RUE. Report given to oncoming nurse

## 2017-12-16 NOTE — Progress Notes (Signed)
Colleen [name] PHYSICAL MEDICINE & REHABILITATION     PROGRESS NOTE  Subjective/Complaints:  Pt seen laying in bed this AM.  She states she slept well overnight.  She states she feels she is getting better.   ROS: Denies CP, SOB, nausea, vomiting, diarrhea.  Objective: Vital Signs: Blood pressure (!) 109/42, pulse 64, temperature 98.6 F (37 C), temperature source Oral, resp. rate 16, height 5\' 4"  (1.626 m), weight 86.5 kg (190 lb 11.2 oz), SpO2 94 %. No results found. Recent Labs    12/15/17 0600 12/16/17 0712  WBC 8.7 11.8*  HGB 10.4* 10.3*  HCT 32.2* 32.2*  PLT 162 173   Recent Labs    12/14/17 1212  NA 135  K 4.3  CL 101  GLUCOSE 75  BUN 42*  CREATININE 1.34*  CALCIUM 10.9*   CBG (last 3)  No results for input(s): GLUCAP in the last 72 hours.  Wt Readings from Last 3 Encounters:  12/16/17 86.5 kg (190 lb 11.2 oz)  12/08/17 88 kg (194 lb 0.1 oz)  11/28/17 73 kg (161 lb)    Physical Exam:  BP (!) 109/42   Pulse 64   Temp 98.6 F (37 C) (Oral)   Resp 16   Ht 5\' 4"  (1.626 m)   Wt 86.5 kg (190 lb 11.2 oz)   SpO2 94%   BMI 32.73 kg/m  Constitutional: NAD. Obese  HENT: Normocephalic. Atraumatic Eyes: EOM are normal. No discharge.  Cardiovascular: RRR. No JVD.  Respiratory: No respiratory distress. Clear to auscultation  GI: Bowel sounds are normal. She exhibits no distension.  Musculoskeletal: Edema: RUE, improving  Neurological: She isalertand oriented. Motor: RUE: Shoulder abduction 1/5, elbow flex/extension 2-/5, hand grip 3+/5 (slowly improving) LUE: 5/5 proximal to distal B/L LE:HF 4/5, KE 4/5, ADF/PF 4+/5.  Skin. Warm and dry Psychiatric: She has a normal mood and affect. Her behavior is normal. Judgment and thought content normal.   Assessment/Plan: 1. Functional deficits secondary to diffuse large B-cell non-Hodgkin's lymphoma which require 3+ hours per day of interdisciplinary therapy in a comprehensive inpatient rehab setting. Physiatrist is  providing close team supervision and 24 hour management of active medical problems listed below. Physiatrist and rehab team continue to assess barriers to discharge/monitor patient progress toward functional and medical goals.  Function:  Bathing Bathing position   Position: Other (comment)(bedside chair)  Bathing parts Body parts bathed by patient: Chest, Abdomen, Right upper leg, Left upper leg Body parts bathed by helper: Left arm, Right lower leg, Left lower leg, Back, Right arm, Front perineal area, Buttocks  Bathing assist Assist Level: 2 helpers   Set up : To open containers, To obtain items  Upper Body Dressing/Undressing Upper body dressing   What is the patient wearing?: Pull over shirt/dress       Pull over shirt/dress - Perfomed by helper: Thread/unthread right sleeve, Thread/unthread left sleeve, Put head through opening, Pull shirt over trunk        Upper body assist Assist Level: Touching or steadying assistance(Pt > 75%)      Lower Body Dressing/Undressing Lower body dressing   What is the patient wearing?: Ted Hose, Non-skid slipper socks           Non-skid slipper socks- Performed by helper: Don/doff right sock, Don/doff left sock               TED Hose - Performed by helper: Don/doff left TED hose, Don/doff right TED hose  Lower body assist  Toileting Toileting Toileting activity did not occur: No continent bowel/bladder event   Toileting steps completed by helper: Adjust clothing prior to toileting, Performs perineal hygiene, Adjust clothing after toileting    Toileting assist Assist level: Two helpers(per Elmo Putt, NT report)   Transfers Chair/bed transfer   Chair/bed transfer method: Other Chair/bed transfer assist level: 2 helpers Chair/bed transfer assistive device: Mechanical lift Mechanical lift: Ecologist     Max distance: 4 ft Assist level: Touching or steadying assistance (Pt > 75%)    Wheelchair   Type: Manual Max wheelchair distance: (10 ft in room) Assist Level: Moderate assistance (Pt 50 - 74%)  Cognition Comprehension Comprehension assist level: Understands basic 75 - 89% of the time/ requires cueing 10 - 24% of the time  Expression Expression assist level: Expresses basic needs/ideas: With no assist  Social Interaction Social Interaction assist level: Interacts appropriately 75 - 89% of the time - Needs redirection for appropriate language or to initiate interaction.  Problem Solving Problem solving assist level: Solves basic 75 - 89% of the time/requires cueing 10 - 24% of the time  Memory Memory assist level: Recognizes or recalls 75 - 89% of the time/requires cueing 10 - 24% of the time     Medical Problem List and Plan: 1.  Decreased functional mobility secondary to diffuse large B-cell non-Hodgkin's lymphoma.   Continue CIR    Rituximab weekly initiated next infusion 12/27, plan for second round of steroids on 12/28.   Plan CHOP with infusional doxorubicin as an outpatient per Heme/Onc 2.  DVT Prophylaxis/Anticoagulation:  Monitor for any bleeding episodes   Coumadin currently on hold, INR pending this AM.  Per VIR, plan to hold until WNL and possible port placement today 3. Pain Management with brachial plexus impingement due to lymphadenopathy             Neurontin 300 mg 3 times a day, oxycodone as needed 4. Mood: Provide emotional support 5. Neuropsych: This patient is capable of making decisions on her own behalf. 6. Skin/Wound Care: Routine skin checks 7. Fluids/Electrolytes/Nutrition: Routine I&O's  8. Atrial fibrillation. Continue Tambocor 100 mg twice a day, Lopressor 12.5 mg twice a day   Controlled on 12/25 9. Hypertension. Lisinopril 20 mg daily. Monitor with increased mobility   Running low this AM.  Will hold AM dose lisinopril 10. Diastolic congestive heart failure. Monitor for any signs of fluid overload. Weigh patient daily Filed  Weights   12/14/17 0332 12/15/17 0557 12/16/17 0600  Weight: 94.9 kg (209 lb 3.5 oz) 94.2 kg (207 lb 10.8 oz) 86.5 kg (190 lb 11.2 oz)    ?Reliability   Last Echo 2016, reordered, completed, relatively stable, with no significant changes 11. Hypothyroidism. Synthroid 12. Gout. Allopurinol 300 mg daily. Monitor for any gout flareups 13. Constipation. Laxative assistance 14. RUE lymphedema: see #3, continue elevation, massage, edema mgt 15. Hypoalbuminemia   Supplement initiated on 12/19 16. Acute blood loss anemia   Hemoglobin 10.3 on 12/25   Continue to monitor 17. 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 Hyponatremia: Resolved   Na 135 on 12/23   Cont to monitor 20. AKI   Cr 1.34 on 12/23   Encourage fluids   Will need to be mindful of tumor lysis syndrome    Cont to monitor 21. Leukocytosis  UA equivocal, urine culture with multiple species   CXR reviewed, showing improvement   Cont to monitor 22. Thrombocytopenia: Resolved   Continue to monitor  23. Postinfusion fluid overload   Lasix 40 daily started on 12/22 24. Urinary retention with incontinence now   DCed Foley, voiding trial   Follow PVRs 25. Leukocytosis   UA/Ucx ordered  LOS (Days) 7 A TO FACE EVALUATION WAS PERFORMED  Kimela Malstrom Lorie Phenix 12/16/2017 7:57 AM

## 2017-12-16 NOTE — Plan of Care (Signed)
  Not Progressing RH BLADDER ELIMINATION RH STG MANAGE BLADDER WITH ASSISTANCE Description STG Manage Bladder With min Assistance  12/16/2017 1225 - Not Progressing by Marney Setting, RN Note Total assist- urinary retention requiring I&Ocaths  RH STG MANAGE BLADDER WITH EQUIPMENT WITH ASSISTANCE Description STG Manage Bladder With Equipment With min  Assistance  12/16/2017 1225 - Not Progressing by Marney Setting, RN

## 2017-12-17 ENCOUNTER — Inpatient Hospital Stay (HOSPITAL_COMMUNITY): Payer: Medicare Other | Admitting: Occupational Therapy

## 2017-12-17 ENCOUNTER — Inpatient Hospital Stay (HOSPITAL_COMMUNITY): Payer: Medicare Other

## 2017-12-17 ENCOUNTER — Encounter (HOSPITAL_COMMUNITY): Payer: Self-pay

## 2017-12-17 ENCOUNTER — Inpatient Hospital Stay (HOSPITAL_COMMUNITY): Payer: Medicare Other | Admitting: Physical Therapy

## 2017-12-17 ENCOUNTER — Encounter (HOSPITAL_COMMUNITY): Payer: Self-pay | Admitting: Interventional Radiology

## 2017-12-17 DIAGNOSIS — E8779 Other fluid overload: Secondary | ICD-10-CM

## 2017-12-17 HISTORY — PX: IR US GUIDE VASC ACCESS RIGHT: IMG2390

## 2017-12-17 HISTORY — PX: IR FLUORO GUIDE PORT INSERTION RIGHT: IMG5741

## 2017-12-17 LAB — CBC
HEMATOCRIT: 28.4 % — AB (ref 36.0–46.0)
HEMOGLOBIN: 9.4 g/dL — AB (ref 12.0–15.0)
MCH: 35.3 pg — ABNORMAL HIGH (ref 26.0–34.0)
MCHC: 33.1 g/dL (ref 30.0–36.0)
MCV: 106.8 fL — ABNORMAL HIGH (ref 78.0–100.0)
Platelets: 158 10*3/uL (ref 150–400)
RBC: 2.66 MIL/uL — AB (ref 3.87–5.11)
RDW: 16.5 % — ABNORMAL HIGH (ref 11.5–15.5)
WBC: 8.3 10*3/uL (ref 4.0–10.5)

## 2017-12-17 LAB — URINE CULTURE: CULTURE: NO GROWTH

## 2017-12-17 LAB — PROTIME-INR
INR: 1.52
PROTHROMBIN TIME: 18.1 s — AB (ref 11.4–15.2)

## 2017-12-17 LAB — BASIC METABOLIC PANEL
ANION GAP: 10 (ref 5–15)
BUN: 58 mg/dL — ABNORMAL HIGH (ref 6–20)
CALCIUM: 10.5 mg/dL — AB (ref 8.9–10.3)
CO2: 23 mmol/L (ref 22–32)
CREATININE: 1.58 mg/dL — AB (ref 0.44–1.00)
Chloride: 105 mmol/L (ref 101–111)
GFR calc non Af Amer: 30 mL/min — ABNORMAL LOW (ref >60)
GFR, EST AFRICAN AMERICAN: 35 mL/min — AB (ref >60)
Glucose, Bld: 78 mg/dL (ref 65–99)
Potassium: 4.6 mmol/L (ref 3.5–5.1)
SODIUM: 138 mmol/L (ref 135–145)

## 2017-12-17 LAB — TISSUE HYBRIDIZATION TO NCBH

## 2017-12-17 LAB — SURGICAL PCR SCREEN
MRSA, PCR: NEGATIVE
STAPHYLOCOCCUS AUREUS: NEGATIVE

## 2017-12-17 MED ORDER — CEFAZOLIN SODIUM-DEXTROSE 2-4 GM/100ML-% IV SOLN
2.0000 g | INTRAVENOUS | Status: AC
  Administered 2017-12-17: 2 g via INTRAVENOUS
  Filled 2017-12-17: qty 100

## 2017-12-17 MED ORDER — CEFAZOLIN SODIUM-DEXTROSE 2-4 GM/100ML-% IV SOLN
INTRAVENOUS | Status: AC
  Administered 2017-12-17: 2 g via INTRAVENOUS
  Filled 2017-12-17: qty 100

## 2017-12-17 MED ORDER — WARFARIN - PHARMACIST DOSING INPATIENT
Freq: Every day | Status: DC
  Administered 2017-12-18 – 2017-12-27 (×3)

## 2017-12-17 MED ORDER — WARFARIN SODIUM 5 MG PO TABS
5.0000 mg | ORAL_TABLET | Freq: Once | ORAL | Status: AC
  Administered 2017-12-17: 5 mg via ORAL
  Filled 2017-12-17: qty 1

## 2017-12-17 MED ORDER — CEPHALEXIN 250 MG PO CAPS
250.0000 mg | ORAL_CAPSULE | Freq: Two times a day (BID) | ORAL | Status: DC
  Administered 2017-12-17: 250 mg via ORAL
  Filled 2017-12-17: qty 1

## 2017-12-17 MED ORDER — MIDAZOLAM HCL 2 MG/2ML IJ SOLN
INTRAMUSCULAR | Status: AC
  Administered 2017-12-17: 11:00:00
  Filled 2017-12-17: qty 4

## 2017-12-17 MED ORDER — HEPARIN SOD (PORK) LOCK FLUSH 100 UNIT/ML IV SOLN
INTRAVENOUS | Status: AC
  Administered 2017-12-17: 11:00:00
  Filled 2017-12-17: qty 5

## 2017-12-17 MED ORDER — HEPARIN SOD (PORK) LOCK FLUSH 100 UNIT/ML IV SOLN
INTRAVENOUS | Status: AC | PRN
  Administered 2017-12-17: 500 [IU] via INTRAVENOUS

## 2017-12-17 MED ORDER — FENTANYL CITRATE (PF) 100 MCG/2ML IJ SOLN
INTRAMUSCULAR | Status: AC
  Administered 2017-12-17: 11:00:00
  Filled 2017-12-17: qty 4

## 2017-12-17 MED ORDER — FENTANYL CITRATE (PF) 100 MCG/2ML IJ SOLN
INTRAMUSCULAR | Status: AC | PRN
  Administered 2017-12-17: 50 ug via INTRAVENOUS

## 2017-12-17 MED ORDER — LIDOCAINE-EPINEPHRINE (PF) 1 %-1:200000 IJ SOLN
INTRAMUSCULAR | Status: AC
  Administered 2017-12-17: 11:00:00
  Filled 2017-12-17: qty 30

## 2017-12-17 MED ORDER — MIDAZOLAM HCL 2 MG/2ML IJ SOLN
INTRAMUSCULAR | Status: AC | PRN
  Administered 2017-12-17: 1 mg via INTRAVENOUS

## 2017-12-17 MED ORDER — LIDOCAINE-EPINEPHRINE (PF) 2 %-1:200000 IJ SOLN
INTRAMUSCULAR | Status: AC | PRN
  Administered 2017-12-17: 20 mL

## 2017-12-17 NOTE — Patient Care Conference (Signed)
Inpatient RehabilitationTeam Conference and Plan of Care Update Date: 12/17/2017   Time: 11:40 AM    Patient Name: Colleen [name]      Medical Record Number: [id number]  Date of Birth: [birth year] Sex: Female         Room/Bed: 4M08C/4M08C-01 Payor Info: Payor: BLUE CROSS BLUE SHIELD MEDICARE / Plan: BCBS MEDICARE / Product Type: *No Product type* /    Admitting Diagnosis: NHL  Admit Date/Time:  12/09/2017  6:07 PM Admission Comments: No comment available   Primary Diagnosis:  <principal problem not specified> Principal Problem: <principal problem not specified>  Patient Active Problem List   Diagnosis Date Noted  . Urinary incontinence   . Urinary retention   . Hypervolemia   . Leukocytosis   . SOB (shortness of breath)   . Hypercalcemia   . HSV (herpes simplex virus) anogenital infection   . Lymphedema   . Hypoalbuminemia due to protein-calorie malnutrition ([place])   . Debility 12/09/2017  . Right arm pain   . Brachial plexopathy   . Diffuse large B-cell lymphoma of lymph nodes of neck ([place])   . PAF (paroxysmal atrial fibrillation) ([place])   . Chronic diastolic congestive heart failure ([place])   . History of TIA (transient ischemic attack)   . Benign essential HTN   . Acute blood loss anemia   . Malignant lymphomas of lymph nodes of head, face, and neck ([place]) 12/03/2017  . Hypoxia 11/30/2017  . Lymphadenopathy   . Acute cystitis without hematuria   . Lactic acidosis   . Degenerative arthritis of left knee 08/14/2016  . Arthritis of left foot 08/14/2016  . Peripheral vascular disease ([place]) 08/14/2016  . Emphysema of lung ([place]) 10/19/2015  . Restrictive lung disease 10/19/2015  . Chronic respiratory failure ([place]) 09/04/2015  . Chronic diastolic CHF (congestive heart failure) ([place]) 08/25/2015  . Paroxysmal atrial fibrillation ([place]) 08/24/2015  . Pulmonary infiltrate   . Multiple lung nodules on CT 08/07/2015  . Acute kidney injury (nontraumatic) ([place])   . Acute diastolic  heart failure ([place]) 07/30/2015  . Acute diastolic CHF (congestive heart failure) ([place]) 07/30/2015  . Acute respiratory failure with hypoxia ([place]) 07/30/2015  . AKI (acute kidney injury) ([place]) 07/30/2015  . Sinus bradycardia 07/30/2015  . Hyponatremia 07/30/2015  . Junctional bradycardia 07/28/2015  . Shortness of breath 07/28/2015  . Chest pain 02/15/2015  . Carotid artery disease ([place]) 07/15/2013  . Radiculopathy 06/19/2011  . LEG PAIN, LEFT 06/21/2010  . PAIN IN THORACIC SPINE 05/11/2010  . OSTEOPENIA 02/23/2009  . Acute low back pain 11/17/2007  . Hyperlipidemia 07/03/2007  . Essential hypertension 07/03/2007  . Hypothyroidism 06/10/2007    Expected Discharge Date: Expected Discharge Date: (10 to 12 days - date TBD)  Team Members Present: Physician leading conference: Dr. Delice Lesch Social Worker Present: Ovidio Kin, LCSW Nurse Present: Brita Romp, RN PT Present: Michaelene Song, PT OT Present: Willeen Cass, OT SLP Present: Windell Moulding, SLP PPS Coordinator present : Daiva Nakayama, RN, CRRN     Current Status/Progress Goal Weekly Team Focus  Medical   Decreased functional mobility secondary to diffuse large B-cell non-Hodgkin's lymphoma.  Improve mobility, RUE function, cancer therapies, edema, AKI, urinary incontinence  See above   Bowel/Bladder   continent of b/b, LBM 12/25, foley placed then removed 12/24, PVR's and cath for volumes >350.  this shift pvr = 0  maintain b/b with mod I assist  timed toileting q 2-3 hours w/a   Swallow/Nutrition/ Hydration  ADL's   Max assist for UB bathing and LB bathing sit to stand.  total assist +2 ( pt 30%) for all dressing sit to stand as well as for stand pivot transfers.  Still with painful RUE and increased edema.   min assist level overall  selfcare retraining, balance retraining, neuromuscular re-education, AE education, pt/family education, therapeutic exercise   Mobility   max assist bed mobility, max  assist sit<>stand, total assist transfers with stedy, gait +2 helpers ft  min assist goals  activity tolerance, LE strengthneing, sit<>stands, standing tolerance, gait, transfers   Communication             Safety/Cognition/ Behavioral Observations            Pain   rare c/o pain, however, pt has severe pain to right arm and guards against moving it.  pain <2 with mod I assist  assess pain q shift and prn   Skin   generalized lymphedema, MASD to peri area/buttocks, left arm edema is weeping significant amounts q shift  healing of masd, swelling decreased, no skin breakdown  assess skin q shift and prn, gerrhardts cream, elevate extremities       *See Care Plan and progress notes for long and short-term goals.     Barriers to Discharge  Current Status/Progress Possible Resolutions Date Resolved   Physician    Decreased caregiver support;Medical stability;Other (comments)  Infusion therapy  See above  Therapies, optimize pain meds, recs per Heme/Onc, IM consult, bladder meds      Nursing                  PT  Inaccessible home environment;Decreased caregiver support;Home environment access/layout;Lack of/limited family support;Pending chemo/radiation  steps to enter, husband and son both work and unable to assist              OT                  SLP                SW                Discharge Planning/Teaching Needs:  Husband wants to take pt home but has limitations of his own and numerous steps into the home. Have him come in for therapies and begin training to see if realistic goal of going home      Team Discussion:   Goals overall min assist level not doing as well due to medical issues-increased edema and creatine up. MD has ordered Internal Med consult. Pain in right arm is limiting her. Currently total to max assist with stedy and plus three with nursing. Infusion tomorrow and steroids to start on Friday. Husband doe snot feel he can provide the care she needs at this time,  wants NHP  Revisions to Treatment Plan:  Probable NHP when discharged from rehab    Continued Need for Acute Rehabilitation Level of Care: The patient requires daily medical management by a physician with specialized training in physical medicine and rehabilitation for the following conditions: Daily direction of a multidisciplinary physical rehabilitation program to ensure safe treatment while eliciting the highest outcome that is of practical value to the patient.: Yes Daily medical management of patient stability for increased activity during participation in an intensive rehabilitation regime.: Yes Daily analysis of laboratory values and/or radiology reports with any subsequent need for medication adjustment of medical intervention for : Neurological problems;Other;Renal problems;Urological problems  Daune Colgate, Wells Guiles  G 12/17/2017, 2:16 PM

## 2017-12-17 NOTE — Sedation Documentation (Signed)
Patient is resting comfortably. 

## 2017-12-17 NOTE — Progress Notes (Signed)
Physical Therapy Session Note  Patient Details  Name: Colleen [name] MRN: [id number] Date of Birth: [birth year]  Today's Date: 12/17/2017 PT Individual Time: 0800-0855 PT Individual Time Calculation (min): 55 min  and Today's Date: 12/17/2017 PT Missed Time: 30 Minutes Missed Time Reason: Patient fatigue(just got back from a procedure, lethargic and drowsy)  Short Term Goals: Week 1:  PT Short Term Goal 1 (Week 1): Pt will perform sit to stand with least restrictive device from w/c with cushion with max A x 1 person. PT Short Term Goal 2 (Week 1): Pt will transfer bed to chair with Max A x 1 person and least restictive device. PT Short Term Goal 3 (Week 1): Pt will ambulate 20 ft with RW and min A x 1.  Skilled Therapeutic Interventions/Progress Updates:    Session 1: Pt supine in bed upon PT arrival, agreeable to therapy tx and reports 8/10 pain in R UE as well as soreness in B LEs. Husband present throughout session and asking questions about pts current progress/status in therapy, therapist educated pt and husband about physical therapy prognosis and cancer related fatigue. Pt transferred from supine>sitting EOB max assist +2. Pt's bed sheets soaked secondary to weaping edema. Pt performed x 3 sit<>stands within the stedy with Max assist + 2 in order for pt to change into dry/clean brief and gown. Pt used stedy total assist in order to transfer from bed>recliner. Pt unable to sit fully upright, requiring mod assist to prevent increased trunk flexion. Pt with increased trunk flexion and leaning forward in standing as well, max cues for hip extension. Pt left seated in recliner at end of session with needs in reach and husband present.   Session 2: Pt missed this session secondary to drowsiness/lethargy after returning from interventional radiology.   Therapy Documentation Precautions:  Precautions Precautions: Fall, Other (comment) Precaution Comments: Chemo; BP only in LE; incontinent  with mobility; R UE pain/lymphedema Restrictions Weight Bearing Restrictions: No   See Function Navigator for Current Functional Status.   Therapy/Group: Individual Therapy  Netta Corrigan, PT, DPT 12/17/2017, 7:53 AM

## 2017-12-17 NOTE — Consult Note (Addendum)
Medical Consultation   GERAL [name]  YTK:[id number]  DOB: 1935-07-19  DOA: 12/09/2017  PCP: Ann Held, DO   Requesting physician: Dr. Posey Pronto  Reason for consultation: AKI and other medical comorbidities   History of Present Illness: Colleen [name] is an 82 y.o. female medical history significant for large B cell non-Hodgkin's lymphoma on rituximab infusion, chronic atrial fibrillation on coumadin, hypertension, diastolic heart failure, hypothyroidism, CVA who presented on 11/29/2017 planes of swelling and pain right arm as well as mass in her right neck and was found to have rapidly developing diffuse lymphadenopathy.  Diffuse large B cell non-Hodgkin's lymphoma was confirmed on lymph node biopsy on 12/02/17.  Medicine consulted given AKI on BMP from 12/16/2017 with creatinine of 2.  Patient states she feels well today.  She denies any worsening cough, shortness of breath, abdominal pain.  She does have chronic diastolic heart failure and has been maintained on daily Lasix in setting of her weekly infusions to prevent volume overload.  Patient does have significant bilateral upper extremity edema more significant on the right arm.  Right arm swelling has been present since day of admission while the left arm swelling started while in the hospital according to patient and her family.    States she sleeps somewhat upright in bed and has been unchanged since hospital admission.  Denies chest pain, fevers, chills, diarrhea.  Notes increased swelling in lower extremities bilaterally.    Review of Systems:  ROS Pt complains of swelling in upper extremities.  As mentioned above in HPI.  Review of systems are otherwise negative.    Past Medical History: Past Medical History:  Diagnosis Date  . Atrial fibrillation ([place])   . Carotid artery disease ([place])   . CHF (congestive heart failure) ([place])   . Hyperlipidemia   . Hypertension   . Hypothyroidism   .  Shortness of breath   . TIA (transient ischemic attack) 2001    Past Surgical History: Past Surgical History:  Procedure Laterality Date  . ABDOMINAL HYSTERECTOMY  1975   TAH,BSO  . CARDIAC CATHETERIZATION N/A 08/14/2015   Procedure: Right Heart Cath;  Surgeon: Larey Dresser, MD;  Location: [facility];  Service: Cardiovascular;  Laterality: N/A;  . carotid duplex  11/24/2012  . carotid duplex  11/01/2010  . CAROTID ENDARTERECTOMY  2001   left  . CHOLECYSTECTOMY    . DOPPLER ECHOCARDIOGRAPHY  02/13/2010   EF>55%  . DOPPLER ECHOCARDIOGRAPHY  09/29/2007  . NM MYOVIEW LTD  02/13/2010  . renal duplex  02/13/2010  . VIDEO BRONCHOSCOPY Bilateral 08/09/2015   Procedure: VIDEO BRONCHOSCOPY WITHOUT FLUORO;  Surgeon: Collene Gobble, MD;  Location: [facility];  Service: Cardiopulmonary;  Laterality: Bilateral;     Allergies:  No Known Allergies   Social History:  reports that she quit smoking about 17 years ago. Her smoking use included cigarettes. She has a 47.00 pack-year smoking history. she has never used smokeless tobacco. She reports that she drinks alcohol. She reports that she does not use drugs.   Family History: Family History  Problem Relation Age of Onset  . Heart failure Mother   . Parkinson's disease Mother   . Arthritis Mother   . Arthritis Sister   . Colon cancer Brother   . Rheumatologic disease Neg Hx   . Lung disease Neg Hx       Physical Exam: Vitals:  12/17/17 1010 12/17/17 1015 12/17/17 1020 12/17/17 1025  BP: (!) 117/43 (!) 111/46 (!) 107/45 (!) 107/40  Pulse: 66 68 70 70  Resp: 12 12 13 14   Temp:      TempSrc:      SpO2: 96% 96% 97% 97%  Weight:      Height:        Constitutional: Sitting in bedside chair in no acute distress Eyes: Anicteric sclera, extraocular movement intact ENMT: Moist oral mucosa Neck: Significant swelling of the neck, nontender nonerythematous CVS: Regular rate and rhythm, no appreciable murmurs, rubs or  gallops Respiratory: Difficult to appreciate given body habitus and positioning in chair.  On anterior chest fields no obvious gross auscultated.  On back exam able to appreciate clear breath sounds Abdomen: Soft, nondistended Musculoskeletal: : Decreased range of motion in right arm, significant swelling of right arm from wrist extending proximally to humerus.  Left arm swelling less than right arm alert oriented x3.  No Neuro: Gross neurologic deficits Psych: Appropriate mood and affect Skin: Port in place on right chest with dressing overlying, no surrounding erythema or tenderness   Data reviewed:  I have personally reviewed following labs and imaging studies Labs:  CBC: Recent Labs  Lab 12/13/17 0519 12/14/17 1212 12/15/17 0600 12/16/17 0712 12/17/17 0542  WBC 6.4 8.4 8.7 11.8* 8.3  NEUTROABS 5.6 6.9  --  10.4*  --   HGB 9.0* 10.4* 10.4* 10.3* 9.4*  HCT 27.1* 31.2* 32.2* 32.2* 28.4*  MCV 102.3* 103.7* 107.0* 107.3* 106.8*  PLT 124* 155 162 173 962    Basic Metabolic Panel: Recent Labs  Lab 12/12/17 0505 12/13/17 0519 12/14/17 1212 12/16/17 0712  NA 133* 134* 135 136  K 5.1 4.4 4.3 4.7  CL 102 101 101 99*  CO2 24 26 25 24   GLUCOSE 116* 104* 75 70  BUN 33* 42* 42* 56*  CREATININE 1.39* 1.33* 1.34* 2.02*  CALCIUM 12.0* 11.1* 10.9* 11.1*   GFR Estimated Creatinine Clearance: 24 mL/min (A) (by C-G formula based on SCr of 2.02 mg/dL (H)). Liver Function Tests: Recent Labs  Lab 12/14/17 1212 12/16/17 0712  AST 54* 48*  ALT 43 36  ALKPHOS 106 101  BILITOT 0.8 0.9  PROT 4.8* 5.1*  ALBUMIN 2.3* 2.4*   No results for input(s): LIPASE, AMYLASE in the last 168 hours. No results for input(s): AMMONIA in the last 168 hours. Coagulation profile Recent Labs  Lab 12/13/17 0519 12/14/17 1212 12/15/17 0600 12/16/17 0712 12/17/17 0542  INR 2.96 2.16 1.69 1.52 1.52    Cardiac Enzymes: No results for input(s): CKTOTAL, CKMB, CKMBINDEX, TROPONINI in the last 168  hours. BNP: Invalid input(s): POCBNP CBG: No results for input(s): GLUCAP in the last 168 hours. D-Dimer No results for input(s): DDIMER in the last 72 hours. Hgb A1c No results for input(s): HGBA1C in the last 72 hours. Lipid Profile No results for input(s): CHOL, HDL, LDLCALC, TRIG, CHOLHDL, LDLDIRECT in the last 72 hours. Thyroid function studies No results for input(s): TSH, T4TOTAL, T3FREE, THYROIDAB in the last 72 hours.  Invalid input(s): FREET3 Anemia work up No results for input(s): VITAMINB12, FOLATE, FERRITIN, TIBC, IRON, RETICCTPCT in the last 72 hours. Urinalysis    Component Value Date/Time   COLORURINE YELLOW 12/16/2017 1516   APPEARANCEUR HAZY (A) 12/16/2017 1516   LABSPEC 1.014 12/16/2017 1516   PHURINE 5.0 12/16/2017 1516   GLUCOSEU NEGATIVE 12/16/2017 1516   HGBUR LARGE (A) 12/16/2017 [place] 12/16/2017 1516  KETONESUR NEGATIVE 12/16/2017 1516   PROTEINUR NEGATIVE 12/16/2017 1516   UROBILINOGEN 0.2 08/11/2015 1244   NITRITE NEGATIVE 12/16/2017 1516   LEUKOCYTESUR MODERATE (A) 12/16/2017 1516     Microbiology Recent Results (from the past 240 hour(s))  Urine Culture     Status: Abnormal   Collection Time: 12/12/17  1:31 PM  Result Value Ref Range Status   Specimen Description URINE, CATHETERIZED  Final   Special Requests NONE  Final   Culture MULTIPLE SPECIES PRESENT, SUGGEST RECOLLECTION (A)  Final   Report Status 12/13/2017 FINAL  Final  Surgical PCR screen     Status: None   Collection Time: 12/17/17  5:08 AM  Result Value Ref Range Status   MRSA, PCR NEGATIVE NEGATIVE Final   Staphylococcus aureus NEGATIVE NEGATIVE Final    Comment: (NOTE) The Xpert SA Assay (FDA approved for NASAL specimens in patients 27 years of age and older), is one component of a comprehensive surveillance program. It is not intended to diagnose infection nor to guide or monitor treatment.        Inpatient Medications:   Scheduled Meds: .  allopurinol  300 mg Oral Daily  . cephALEXin  250 mg Oral Q12H  . feeding supplement (PRO-STAT SUGAR FREE 64)  30 mL Oral BID  . fentaNYL      . flecainide  100 mg Oral BID  . furosemide  40 mg Oral Daily  . gabapentin  300 mg Oral TID  . Gerhardt's butt cream   Topical BID  . heparin lock flush      . levothyroxine  88 mcg Oral QAC breakfast  . lidocaine-EPINEPHrine      . metoprolol tartrate  12.5 mg Oral BID  . midazolam      . polyethylene glycol  17 g Oral Daily  . senna-docusate  1 tablet Oral QHS  . valACYclovir  500 mg Oral TID   Continuous Infusions: . sodium chloride    . famotidine       Radiological Exams on Admission: No results found.  Impression/Recommendations Active Problems:   AKI (acute kidney injury) ([place])   Debility   Lymphedema   Hypoalbuminemia due to protein-calorie malnutrition (H[facility])   Hypercalcemia   HSV (herpes simplex virus) anogenital infection   Leukocytosis   SOB (shortness of breath)   Hypervolemia   Urinary retention   Urinary incontinence  Diffuse large cell B-cell non-Hodgkin's lymphoma, high-grade, diagnosed by lymph node biopsy December 2018 - Hematology/oncology following - Weekly Rituximab infusion (to resume 12/27), and prednisone (to resume 12/28) - allopurinol for tumor lysis syndrome prophylaxis  Hypercalcemia of  malignancy, mild (Ca:10.5).  Calcitonin given early during hospital admission. Pamidronate given 12/10/2017 -Continue to monitor BMP  CHFpEF.  TTE 12/13/2017: Preserved EF grade 1 diastolic dysfunction.  Current weight 196 lb ( Peak on admission 223), I/O: net -5.928 on admission - Follow strict I's and O's, especially in setting of no Foley -Would increase Lasix today to 60 mg daily prior to rituximab infusion on tomorrow as expect increase in volume -Continue daily weights  AKI, improving.  Currently 1.58 improved from 2.02 on 2 days ago. DDX: acute urinary retention (?foley previously in place), do not think  this is over diuresis as patient still is significantly volume up on physical exam, -bladder scan for postvoid residuals -Agree with DC Foley and monitoring on voiding trial  Leukocytosis, resolved.  Slight elevation in white count now resolved on today's CBC.  UA shows moderate leukocytes with a  few bacteria, urine culture negative and patient is asymptomatic. - Would recommend holding on Keflex and monitoring for symptoms  Acute blood loss anemia, stable -Continue to monitor on CBC  Chronic atrial fibrillation, rate controlled -Continue flecainide, Lopressor 12.5 twice daily -Coumadin held for port placement today port, would resume if no further procedure expected  Peripheral neuropathy, stable -Continue gabapentin 300 mg 3 times daily  Hypoalbuminemia, stable -Agree with supplementation  COPD -Duo nebs as needed wheezing  HSV -Valtrex  Hypothyroidism, stable -Continue   Thank you for this consultation.  Our [facility] hospitalist team will follow the patient with you.     Desiree Hane M.D. [facility] [email or web address] Password [email or web address]  12/17/2017, 10:50 AM

## 2017-12-17 NOTE — Plan of Care (Signed)
  Not Progressing RH BLADDER ELIMINATION RH STG MANAGE BLADDER WITH ASSISTANCE Description STG Manage Bladder With Total Assistance   12/17/2017 1434 - Not Progressing by Brita Romp, RN Note Pt is incontinent of bladder requires total assistance since removal of indwelling catheter.

## 2017-12-17 NOTE — Procedures (Signed)
  Procedure: R EJ Port catheter placement   Preprocedure diagnosis: Lymphoma Postprocedure diagnosis: same EBL:   minimal Complications:  none immediate  See full dictation in [facility].  Dillard Cannon MD Main # [phone] Pager  [phone]

## 2017-12-17 NOTE — Sedation Documentation (Signed)
Doctor at bedside.

## 2017-12-17 NOTE — Sedation Documentation (Signed)
Patient denies pain and is resting comfortably.  

## 2017-12-17 NOTE — Progress Notes (Signed)
[place] for Warfarin Indication: atrial fibrillation  No Known Allergies  Patient Measurements: Height: 5\' 4"  (162.6 cm) Weight: 196 lb 10.4 oz (89.2 kg) IBW/kg (Calculated) : 54.7  Heparin dosing weight = 70.5 kg  Vital Signs: Temp: 98.1 F (36.7 C) (12/26 1400) Temp Source: Oral (12/26 1400) BP: 117/59 (12/26 1612) Pulse Rate: 69 (12/26 1612)  Labs: Recent Labs    12/15/17 0600 12/16/17 0712 12/17/17 0542 12/17/17 1138  HGB 10.4* 10.3* 9.4*  --   HCT 32.2* 32.2* 28.4*  --   PLT 162 173 158  --   LABPROT 19.8* 18.1* 18.1*  --   INR 1.69 1.52 1.52  --   CREATININE  --  2.02*  --  1.58*   Estimated Creatinine Clearance: 30.7 mL/min (A) (by C-G formula based on SCr of 1.58 mg/dL (H)).  Assessment: 83 y/o female admitted with lymphadenopathy requiring biopsy. She is on chronic warfarin for history of afib. Warfarin was restarted 12/13 and completed heparin bridge. INR 2.25 > 2.99, big increase, noticed decrease po intake after rituxan on 12/20. CBC stable. No bleed documented.  Warfarin has been on old for porta cath placement - completed 12/26 INR 1.5 - restart warfarin tonight  Home coumadin dose: 2.5mg  daily except 5mg  MF  Goal of Therapy:  INR 2-3 Monitor platelets by anticoagulation protocol: Yes   Plan:  -Coumadin 5mg  PO x 1 - per home dose -Daily INR -Monitor for s/sx bleeding  Bonnita Nasuti Pharm.D. CPP, BCPS Clinical Pharmacist [phone] 12/17/2017 10:08 PM

## 2017-12-17 NOTE — Progress Notes (Signed)
[facility] PHYSICAL MEDICINE & REHABILITATION     PROGRESS NOTE  Subjective/Complaints:  Pt seen laying in bed this AM.  She slept well overnight.  Husband at bedside.  He has several questions regarding her edema, endurance, and RUE function.  He also states that he cannot care for her.   ROS: Denies CP, SOB, nausea, vomiting, diarrhea.  Objective: Vital Signs: Blood pressure (!) 112/39, pulse 63, temperature 97.6 F (36.4 C), temperature source Oral, resp. rate 18, height 5\' 4"  (1.626 m), weight 89.2 kg (196 lb 10.4 oz), SpO2 93 %. No results found. Recent Labs    12/16/17 0712 12/17/17 0542  WBC 11.8* 8.3  HGB 10.3* 9.4*  HCT 32.2* 28.4*  PLT 173 158   Recent Labs    12/14/17 1212 12/16/17 0712  NA 135 136  K 4.3 4.7  CL 101 99*  GLUCOSE 75 70  BUN 42* 56*  CREATININE 1.34* 2.02*  CALCIUM 10.9* 11.1*   CBG (last 3)  No results for input(s): GLUCAP in the last 72 hours.  Wt Readings from Last 3 Encounters:  12/17/17 89.2 kg (196 lb 10.4 oz)  12/08/17 88 kg (194 lb 0.1 oz)  11/28/17 73 kg (161 lb)    Physical Exam:  BP (!) 112/39 (BP Location: Right Leg)   Pulse 63   Temp 97.6 F (36.4 C) (Oral)   Resp 18   Ht 5\' 4"  (1.626 m)   Wt 89.2 kg (196 lb 10.4 oz)   SpO2 93%   BMI 33.76 kg/m  Constitutional: NAD. Obese  HENT: Normocephalic. Atraumatic Eyes: EOM are normal. No discharge.  Cardiovascular: RRR. No JVD.  Respiratory: No respiratory distress. Clear to auscultation  GI: Bowel sounds are normal. She exhibits no distension.  Musculoskeletal: Edema in all extremities  Neurological: She isalertand oriented. Motor: RUE: Shoulder abduction 1/5, elbow flex/extension 2-/5, hand grip 3+/5 (stable) LUE: 5/5 proximal to distal B/L LE: HF 4/5, KE 4/5, ADF/PF 4+/5.  Skin. Warm and dry Psychiatric: She has a normal mood and affect. Her behavior is normal. Judgment and thought content normal.   Assessment/Plan: 1. Functional deficits secondary to diffuse  large B-cell non-Hodgkin's lymphoma which require 3+ hours per day of interdisciplinary therapy in a comprehensive inpatient rehab setting. Physiatrist is providing close team supervision and 24 hour management of active medical problems listed below. Physiatrist and rehab team continue to assess barriers to discharge/monitor patient progress toward functional and medical goals.  Function:  Bathing Bathing position   Position: Other (comment)(bedside chair)  Bathing parts Body parts bathed by patient: Chest, Abdomen, Right upper leg, Left upper leg Body parts bathed by helper: Left arm, Right lower leg, Left lower leg, Back, Right arm, Front perineal area, Buttocks  Bathing assist Assist Level: 2 helpers   Set up : To open containers, To obtain items  Upper Body Dressing/Undressing Upper body dressing   What is the patient wearing?: Pull over shirt/dress       Pull over shirt/dress - Perfomed by helper: Thread/unthread right sleeve, Thread/unthread left sleeve, Put head through opening, Pull shirt over trunk        Upper body assist Assist Level: Touching or steadying assistance(Pt > 75%)      Lower Body Dressing/Undressing Lower body dressing   What is the patient wearing?: Ted Hose, Non-skid slipper socks           Non-skid slipper socks- Performed by helper: Don/doff right sock, Don/doff left sock  TED Hose - Performed by helper: Don/doff left TED hose, Don/doff right TED hose  Lower body assist        Toileting Toileting Toileting activity did not occur: No continent bowel/bladder event   Toileting steps completed by helper: Adjust clothing prior to toileting, Performs perineal hygiene, Adjust clothing after toileting    Toileting assist Assist level: Two helpers   Transfers Chair/bed transfer   Chair/bed transfer method: Other Chair/bed transfer assist level: 2 helpers Chair/bed transfer assistive device: Mechanical lift Mechanical lift:  Ecologist     Max distance: 4 ft Assist level: Touching or steadying assistance (Pt > 75%)   Wheelchair   Type: Manual Max wheelchair distance: (10 ft in room) Assist Level: Moderate assistance (Pt 50 - 74%)  Cognition Comprehension Comprehension assist level: Understands basic 75 - 89% of the time/ requires cueing 10 - 24% of the time  Expression Expression assist level: Expresses basic needs/ideas: With no assist  Social Interaction Social Interaction assist level: Interacts appropriately 75 - 89% of the time - Needs redirection for appropriate language or to initiate interaction.  Problem Solving Problem solving assist level: Solves basic 75 - 89% of the time/requires cueing 10 - 24% of the time  Memory Memory assist level: Recognizes or recalls 75 - 89% of the time/requires cueing 10 - 24% of the time     Medical Problem List and Plan: 1.  Decreased functional mobility secondary to diffuse large B-cell non-Hodgkin's lymphoma.   Continue CIR    Rituximab weekly initiated next infusion 12/27, plan for second round of steroids on 12/28.   Plan CHOP with infusional doxorubicin as an outpatient per Heme/Onc 2.  DVT Prophylaxis/Anticoagulation:  Monitor for any bleeding episodes   Coumadin currently on hold, INR pending this AM.  Per VIR, port should be placed today 3. Pain Management with brachial plexus impingement due to lymphadenopathy             Neurontin 300 mg 3 times a day, oxycodone as needed 4. Mood: Provide emotional support 5. Neuropsych: This patient is capable of making decisions on her own behalf. 6. Skin/Wound Care: Routine skin checks 7. Fluids/Electrolytes/Nutrition: Routine I&O's  8. Atrial fibrillation. Continue Tambocor 100 mg twice a day, Lopressor 12.5 mg twice a day   Controlled on 12/26 9. Hypertension. Monitor with increased mobility    Lisinopril 20 mg daily d/ced on 12/26   Relatively controlled on 12/26 10. Diastolic congestive  heart failure. Monitor for any signs of fluid overload. Weigh patient daily [facility] Weights   12/15/17 0557 12/16/17 0600 12/17/17 0540  Weight: 94.2 kg (207 lb 10.8 oz) 86.5 kg (190 lb 11.2 oz) 89.2 kg (196 lb 10.4 oz)    ?Reliability   Last Echo 2016, reordered, completed, relatively stable, with no significant changes 11. Hypothyroidism. Synthroid 12. Gout. Allopurinol 300 mg daily. Monitor for any gout flareups 13. Constipation. Laxative assistance 14. RUE lymphedema: see #3, continue elevation, massage, edema mgt 15. Hypoalbuminemia   Supplement initiated on 12/19 16. Acute blood loss anemia   Hemoglobin 9.4 on 12/26   Continue to monitor 17. Hypercalcemia   Calcium 11.1 on 12/25   IV Pamidronate given on 12/19, recs per Heme/Onc   Cont to monitor 18. 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 Hyponatremia: Resolved   Cont to monitor 20. AKI   Cr 2.02 on 12/25   Will speak to medicine regarding management   Encourage fluids   Will need  to be mindful of tumor lysis syndrome    Cont to monitor 21. Leukocytosis   UA equivocal, urine culture with multiple species   CXR reviewed, showing improvement   Cont to monitor 22. Thrombocytopenia: Resolved   Continue to monitor  23. Postinfusion fluid overload   Lasix 40 daily started on 12/22 24. Urinary retention with incontinence now   DCed Foley, voiding trial   Follow PVRs 25. Leukocytosis   UA+, Ucx pending   Empiric keflex started on 12/26  LOS (Days) 8 A TO FACE EVALUATION WAS PERFORMED  Colleen [name] Lorie Phenix 12/17/2017 8:29 AM

## 2017-12-17 NOTE — Progress Notes (Signed)
Physical Therapy Session Note  Patient Details  Name: Colleen [name] MRN: [id number] Date of Birth: 1935/09/12  Today's Date: 12/17/2017 PT Individual Time: 1530-1610 PT Individual Time Calculation (min): 40 min   Short Term Goals: Week 1:  PT Short Term Goal 1 (Week 1): Pt will perform sit to stand with least restrictive device from w/c with cushion with max A x 1 person. PT Short Term Goal 2 (Week 1): Pt will transfer bed to chair with Max A x 1 person and least restictive device. PT Short Term Goal 3 (Week 1): Pt will ambulate 20 ft with RW and min A x 1.  Skilled Therapeutic Interventions/Progress Updates:    Pt supine in bed upon therapist arrival, pt is agreeable to participate in therapy session. Pt reports pain in RUE, pain is not rated and decreases with positioning. Pt reports she has been incontinent, rolling R/L with assist x 2 and use of bedrails for brief change and pericare. Supine to sit with assist x 2 and HOB elevated, pt requires min assist for LE management and assist for trunk control from supine to sit. Sit to stand assist x 2 to stedy, cues for weight shift forwards and UE placement on stedy. Transfer bed to recliner with stedy. Pt left seated in recliner in room with needs in reach, pillows for positioning of upright trunk and BUE support. Pt on 2L O2 throughout therapy session, SpO2 93% and above with activity.   Therapy Documentation Precautions:  Precautions Precautions: Fall, Other (comment) Precaution Comments: Chemo; BP only in LE; incontinent with mobility; R UE pain/lymphedema Restrictions Weight Bearing Restrictions: No  See Function Navigator for Current Functional Status.   Therapy/Group: Individual Therapy   Excell Seltzer, PT, DPT  12/17/2017, 4:50 PM

## 2017-12-18 ENCOUNTER — Inpatient Hospital Stay (HOSPITAL_COMMUNITY): Payer: Medicare Other | Admitting: Occupational Therapy

## 2017-12-18 ENCOUNTER — Other Ambulatory Visit: Payer: Self-pay | Admitting: *Deleted

## 2017-12-18 ENCOUNTER — Inpatient Hospital Stay (HOSPITAL_COMMUNITY): Payer: Medicare Other | Admitting: Physical Therapy

## 2017-12-18 LAB — CBC
HEMATOCRIT: 29.6 % — AB (ref 36.0–46.0)
Hemoglobin: 9.5 g/dL — ABNORMAL LOW (ref 12.0–15.0)
MCH: 34.8 pg — ABNORMAL HIGH (ref 26.0–34.0)
MCHC: 32.1 g/dL (ref 30.0–36.0)
MCV: 108.4 fL — ABNORMAL HIGH (ref 78.0–100.0)
PLATELETS: 178 10*3/uL (ref 150–400)
RBC: 2.73 MIL/uL — ABNORMAL LOW (ref 3.87–5.11)
RDW: 16.8 % — AB (ref 11.5–15.5)
WBC: 7.8 10*3/uL (ref 4.0–10.5)

## 2017-12-18 LAB — CBC WITH DIFFERENTIAL/PLATELET
BASOS PCT: 0 %
Basophils Absolute: 0 10*3/uL (ref 0.0–0.1)
EOS ABS: 0.2 10*3/uL (ref 0.0–0.7)
Eosinophils Relative: 3 %
HEMATOCRIT: 37.1 % (ref 36.0–46.0)
HEMOGLOBIN: 11.8 g/dL — AB (ref 12.0–15.0)
LYMPHS ABS: 0.6 10*3/uL — AB (ref 0.7–4.0)
Lymphocytes Relative: 9 %
MCH: 34.4 pg — ABNORMAL HIGH (ref 26.0–34.0)
MCHC: 31.8 g/dL (ref 30.0–36.0)
MCV: 108.2 fL — ABNORMAL HIGH (ref 78.0–100.0)
Monocytes Absolute: 0.4 10*3/uL (ref 0.1–1.0)
Monocytes Relative: 6 %
NEUTROS ABS: 5.1 10*3/uL (ref 1.7–7.7)
NEUTROS PCT: 82 %
Platelets: 146 10*3/uL — ABNORMAL LOW (ref 150–400)
RBC: 3.43 MIL/uL — ABNORMAL LOW (ref 3.87–5.11)
RDW: 16.7 % — ABNORMAL HIGH (ref 11.5–15.5)
WBC: 6.2 10*3/uL (ref 4.0–10.5)

## 2017-12-18 LAB — COMPREHENSIVE METABOLIC PANEL
ALBUMIN: 2.1 g/dL — AB (ref 3.5–5.0)
ALT: 23 U/L (ref 14–54)
ANION GAP: 10 (ref 5–15)
AST: 46 U/L — AB (ref 15–41)
Alkaline Phosphatase: 88 U/L (ref 38–126)
BILIRUBIN TOTAL: 0.9 mg/dL (ref 0.3–1.2)
BUN: 60 mg/dL — AB (ref 6–20)
CHLORIDE: 101 mmol/L (ref 101–111)
CO2: 27 mmol/L (ref 22–32)
Calcium: 10.8 mg/dL — ABNORMAL HIGH (ref 8.9–10.3)
Creatinine, Ser: 1.56 mg/dL — ABNORMAL HIGH (ref 0.44–1.00)
GFR calc Af Amer: 35 mL/min — ABNORMAL LOW (ref >60)
GFR calc non Af Amer: 30 mL/min — ABNORMAL LOW (ref >60)
GLUCOSE: 65 mg/dL (ref 65–99)
Potassium: 4.7 mmol/L (ref 3.5–5.1)
Sodium: 138 mmol/L (ref 135–145)
TOTAL PROTEIN: 4.8 g/dL — AB (ref 6.5–8.1)

## 2017-12-18 LAB — PROTIME-INR
INR: 1.39
PROTHROMBIN TIME: 16.9 s — AB (ref 11.4–15.2)

## 2017-12-18 LAB — URIC ACID: URIC ACID, SERUM: 5.4 mg/dL (ref 2.3–6.6)

## 2017-12-18 LAB — LACTATE DEHYDROGENASE: LDH: 464 U/L — ABNORMAL HIGH (ref 98–192)

## 2017-12-18 MED ORDER — VALACYCLOVIR HCL 500 MG PO TABS
500.0000 mg | ORAL_TABLET | Freq: Every day | ORAL | Status: DC
  Administered 2017-12-19 – 2017-12-22 (×4): 500 mg via ORAL
  Filled 2017-12-18 (×4): qty 1

## 2017-12-18 MED ORDER — SODIUM CHLORIDE 0.9% FLUSH
10.0000 mL | INTRAVENOUS | Status: DC | PRN
  Administered 2017-12-26: 10 mL
  Filled 2017-12-18: qty 40

## 2017-12-18 MED ORDER — WARFARIN SODIUM 5 MG PO TABS
5.0000 mg | ORAL_TABLET | Freq: Once | ORAL | Status: AC
  Administered 2017-12-18: 5 mg via ORAL
  Filled 2017-12-18: qty 1

## 2017-12-18 MED ORDER — FUROSEMIDE 20 MG PO TABS
20.0000 mg | ORAL_TABLET | Freq: Once | ORAL | Status: DC
  Filled 2017-12-18: qty 1

## 2017-12-18 NOTE — Progress Notes (Addendum)
Occupational Therapy Session Note  Patient Details  Name: Colleen [name] MRN: [id number] Date of Birth: [birth year]           Make up session Today's Date: 12/18/2017 OT Individual Time: 1304-1405 OT Individual Time Calculation (min): 61 min    Short Term Goals: Week 2:  OT Short Term Goal 1 (Week 2): Pt will maintain static sitting during selfcare tasks with mod assist.  OT Short Term Goal 2 (Week 2): Pt will perform UB bathing with mod assist using AE PRN. OT Short Term Goal 3 (Week 2): Pt will perform stand pivot toilet transfer to the 3:1 with mod assist.  OT Short Term Goal 4 (Week 2): Pt will tolerate AAROM of the right shoulder 0-70 degrees for edema control and for greater use with selfcare tasks.    Skilled Therapeutic Interventions/Progress Updates:    Therapist completed coban wrapping for edema in the right hand and arm as recommend after discussion with PA.  Pt with no adverse reactions to treatment with noted capillary refill at the distal end of each digit when tested.  Pt's spouse in for half of session as well.  Discussed with him pt's current level of assist and also noted decline in her ability to complete selfcare tasks compared to last week.  Pt then worked on sit to stand transitions and standing balance while removing soiled brief and donning new one.  Pt able to maintain standing for 30 seconds to one minute with total assist to complete.  Still with flexed trunk and head in standing, not being able to complete full hip extension.  Total assist +2 (pt 30%) for sit to stand when donning new brief.  Pt left positioned in wheelchair at conclusion of session with spouse present.  Pt currently needing min assist to grasp, hold, and replace cup with the LUE.  Nursing made aware of coban wrapping and removal if pt reports problems.    Therapy Documentation Precautions:  Precautions Precautions: Fall, Other (comment) Precaution Comments: Chemo; BP only in LE; incontinent with  mobility; R UE pain/lymphedema Restrictions Weight Bearing Restrictions: No  Pain: Pain Assessment Pain Assessment: Faces Faces Pain Scale: Hurts a little bit Pain Type: Acute pain Pain Location: Arm Pain Orientation: Right Pain Descriptors / Indicators: Discomfort Pain Onset: With Activity Pain Intervention(s): Repositioned ADL:  See Function Navigator for Current Functional Status.   Therapy/Group: Individual Therapy  Mardell Cragg OTR/L  12/18/2017, 2:14 PM

## 2017-12-18 NOTE — Progress Notes (Signed)
[place] for Warfarin Indication: atrial fibrillation  No Known Allergies  Patient Measurements: Height: 5\' 4"  (162.6 cm) Weight: 213 lb 3 oz (96.7 kg) IBW/kg (Calculated) : 54.7  Heparin dosing weight = 70.5 kg  Vital Signs: Temp: 97.9 F (36.6 C) (12/27 0244) Temp Source: Oral (12/27 0244) BP: 104/43 (12/27 0244) Pulse Rate: 67 (12/27 0244)  Labs: Recent Labs    12/16/17 0712 12/17/17 0542 12/17/17 1138 12/18/17 0538  HGB 10.3* 9.4*  --  11.8*  9.5*  HCT 32.2* 28.4*  --  37.1  29.6*  PLT 173 158  --  146*  178  LABPROT 18.1* 18.1*  --  16.9*  INR 1.52 1.52  --  1.39  CREATININE 2.02*  --  1.58* 1.56*   Estimated Creatinine Clearance: 32.5 mL/min (A) (by C-G formula based on SCr of 1.56 mg/dL (H)).  Assessment: 82 y/o female admitted with lymphadenopathy requiring biopsy. She is on chronic warfarin for history of afib. Warfarin was restarted 12/13 and completed heparin bridge. INR 2.25 > 2.99, big increase, noticed decrease po intake after rituxan on 12/20. CBC stable. No bleed documented.  Warfarin had been on hold for porta cath placement. S/p  R EJ port catheter placed  12/26 and warfarin restarted 12/26.  INR = 1.39 , subtherapeutic. No bleeding noted.  Hgb low stable 9.5, pltc 178k    Home coumadin dose: 2.5mg  daily except 5mg  MF (INR was 1.91 on 12/7)   Goal of Therapy:  INR 2-3 Monitor platelets by anticoagulation protocol: Yes   Plan:  -Coumadin 5mg  PO x 1  -Daily INR -Monitor for s/sx bleeding  Nicole Cella, RPh Clinical Pharmacist Pager: [phone]-4P [phone]-10P [place] [phone] 12/18/2017 2:28 PM

## 2017-12-18 NOTE — Progress Notes (Signed)
Called oncology services [phone] in regards to plan infusion for RITUXIMAB for today 12/18/2017 await return call in regards to plan of care and messages have been left with Dr.Gus Marginat nurse

## 2017-12-18 NOTE — Progress Notes (Signed)
Physical Therapy Session Note  Patient Details  Name: Colleen [name] MRN: [id number] Date of Birth: [birth year]  Today's Date: 12/18/2017 PT Individual Time: 6301-6010 AND 1425-1450 PT Individual Time Calculation (min): 56 min AND 25 min   Short Term Goals: Week 1:  PT Short Term Goal 1 (Week 1): Pt will perform sit to stand with least restrictive device from w/c with cushion with max A x 1 person. PT Short Term Goal 2 (Week 1): Pt will transfer bed to chair with Max A x 1 person and least restictive device. PT Short Term Goal 3 (Week 1): Pt will ambulate 20 ft with RW and min A x 1.  Skilled Therapeutic Interventions/Progress Updates:   Session 1:  Pt asleep upon arrival, easily woken up and agreeable to therapy. C/o RUE pain w/ movement, resolves w/ rest, did not rate. Worked on tolerance to upright and OOB activity this session. Transferred to EOB w/ Max assist +2, manual facilitation for weightshifting and LE management. Transferred to recliner via stedy w/ Max assist + 2 to stand, min assist to remain upright in stedy w/ LUE support. Pt repositioned in stedy w/ pillows for comfort and safe positioning. Changed gown w/ total assist 2/2 soaking from skin weeping. Assisted pt w/ eating breakfast and drinking juice/coffee w/ manual assist/facilitation of LUE use for eating/drinking. Mod assist overall for eating breakfast, multiple rest breaks 2/2 fatigue which resolved w/ rest and verbal cues for breathing technique. Moderate increase in work of breathing w/ upright activity, no signs or symptoms of O2 desat throughout session, no c/o SOB. Ended session resting in recliner, call bell within reach and all needs met. Pt agreeable to sit up in recliner until next therapy session to work on tolerance to OOB activity.   Session 2:  Pt in recliner upon arrival and agreeable to therapy, no c/o pain. Pt reports having been OOB since session this morning, requesting to return to supine. Transferred to  EOB via stedy, Max assist +2 to boost into standing. Pt unable to maintain static sitting at EOB w/o Max assist from therapist, transitioned to supine w/ max assist for LE management. Pt required maximal manual and verbal cues cues throughout transfer for upright positioning and using LUE for support on stedy. Assisted pt w/ repositioning in bed for pt's comfort and pressure relief. Total assist for scooting up in bed. Set-up assist to position w/ pillows at all extremities. Verbalized techniques being utilized for pressure relief and pt's comfort, pt and husband appreciative of education. Ended session in supine, call bell within reach and all needs met.   Therapy Documentation Precautions:  Precautions Precautions: Fall, Other (comment) Precaution Comments: Chemo; BP only in LE; incontinent with mobility; R UE pain/lymphedema Restrictions Weight Bearing Restrictions: No  See Function Navigator for Current Functional Status.   Therapy/Group: Individual Therapy  Lysbeth Dicola K Arnette 12/18/2017, 9:03 AM

## 2017-12-18 NOTE — Progress Notes (Signed)
[place] PHYSICAL MEDICINE & REHABILITATION     PROGRESS NOTE  Subjective/Complaints:  Patient seen lying in bed this morning. She slept well overnight. She states her mouth feels dry this morning. She is ready for her infusion today.  ROS: Denies CP, nausea, vomiting, diarrhea.  Objective: Vital Signs: Blood pressure (!) 104/43, pulse 67, temperature 97.9 F (36.6 C), temperature source Oral, resp. rate 14, height 5\' 4"  (1.626 m), weight 96.7 kg (213 lb 3 oz), SpO2 94 %. Ir US Guide Vasc Access Right  Result Date: 12/17/2017 CLINICAL DATA:  Lymphoma. Needs durable venous access for planned chemotherapy regimen. EXAM: TUNNELED PORT CATHETER PLACEMENT WITH ULTRASOUND AND FLUOROSCOPIC GUIDANCE FLUOROSCOPY TIME:  0.1 minutes  ; 12  uGym2 DAP ANESTHESIA/SEDATION: Intravenous Fentanyl and Versed were administered as conscious sedation during continuous monitoring of the patient's level of consciousness and physiological / cardiorespiratory status by the radiology RN, with a total moderate sedation time of 15 minutes. TECHNIQUE: The procedure, risks, benefits, and alternatives were explained to the patient. Questions regarding the procedure were encouraged and answered. The patient understands and consents to the procedure. As antibiotic prophylaxis, cefazolin 2 g was ordered pre-procedure and administered intravenously within one hour of incision. External compression of the right IJ vein secondary to adenopathy was noted, corresponding to findings from recent CT chest. Patency of the right external jugular vein was identified. Patency of the right EJ vein was confirmed with ultrasound with image documentation. An appropriate skin site was determined. Skin site was marked. Region was prepped using maximum barrier technique including cap and mask, sterile gown, sterile gloves, large sterile sheet, and Chlorhexidine as cutaneous antisepsis. The region was infiltrated locally with 1% lidocaine. Under  real-time ultrasound guidance, the right EJ vein was accessed with a 21 gauge micropuncture needle; the needle tip within the vein was confirmed with ultrasound image documentation. Needle was exchanged over a 018 guidewire for transitional dilator which allowed passage of the [facility] wire into the IVC. Over this, the transitional dilator was exchanged for a 5 [place] MPA catheter. A small incision was made on the right anterior chest wall and a subcutaneous pocket fashioned. The power-injectable port was positioned and its catheter tunneled to the right EJ dermatotomy site. The MPA catheter was exchanged over an Amplatz wire for a peel-away sheath, through which the port catheter, which had been trimmed to the appropriate length, was advanced and positioned under fluoroscopy with its tip at the cavoatrial junction. Spot chest radiograph confirms good catheter position and no pneumothorax. The pocket was closed with deep interrupted and subcuticular continuous 3-0 Monocryl sutures. The port was flushed per protocol. The incisions were covered with Dermabond then covered with a sterile dressing. COMPLICATIONS: COMPLICATIONS None immediate IMPRESSION: Technically successful right EJ power-injectable port catheter placement. Ready for routine use. Electronically Signed   By: Lucrezia Europe M.D.   On: 12/17/2017 14:29   Ir Fluoro Guide Port Insertion Right  Result Date: 12/17/2017 CLINICAL DATA:  Lymphoma. Needs durable venous access for planned chemotherapy regimen. EXAM: TUNNELED PORT CATHETER PLACEMENT WITH ULTRASOUND AND FLUOROSCOPIC GUIDANCE FLUOROSCOPY TIME:  0.1 minutes  ; 12  uGym2 DAP ANESTHESIA/SEDATION: Intravenous Fentanyl and Versed were administered as conscious sedation during continuous monitoring of the patient's level of consciousness and physiological / cardiorespiratory status by the radiology RN, with a total moderate sedation time of 15 minutes. TECHNIQUE: The procedure, risks, benefits, and  alternatives were explained to the patient. Questions regarding the procedure were encouraged and answered. The patient  understands and consents to the procedure. As antibiotic prophylaxis, cefazolin 2 g was ordered pre-procedure and administered intravenously within one hour of incision. External compression of the right IJ vein secondary to adenopathy was noted, corresponding to findings from recent CT chest. Patency of the right external jugular vein was identified. Patency of the right EJ vein was confirmed with ultrasound with image documentation. An appropriate skin site was determined. Skin site was marked. Region was prepped using maximum barrier technique including cap and mask, sterile gown, sterile gloves, large sterile sheet, and Chlorhexidine as cutaneous antisepsis. The region was infiltrated locally with 1% lidocaine. Under real-time ultrasound guidance, the right EJ vein was accessed with a 21 gauge micropuncture needle; the needle tip within the vein was confirmed with ultrasound image documentation. Needle was exchanged over a 018 guidewire for transitional dilator which allowed passage of the [facility] wire into the IVC. Over this, the transitional dilator was exchanged for a 5 [place] MPA catheter. A small incision was made on the right anterior chest wall and a subcutaneous pocket fashioned. The power-injectable port was positioned and its catheter tunneled to the right EJ dermatotomy site. The MPA catheter was exchanged over an Amplatz wire for a peel-away sheath, through which the port catheter, which had been trimmed to the appropriate length, was advanced and positioned under fluoroscopy with its tip at the cavoatrial junction. Spot chest radiograph confirms good catheter position and no pneumothorax. The pocket was closed with deep interrupted and subcuticular continuous 3-0 Monocryl sutures. The port was flushed per protocol. The incisions were covered with Dermabond then covered with a  sterile dressing. COMPLICATIONS: COMPLICATIONS None immediate IMPRESSION: Technically successful right EJ power-injectable port catheter placement. Ready for routine use. Electronically Signed   By: Lucrezia Europe M.D.   On: 12/17/2017 14:29   Recent Labs    12/17/17 0542 12/18/17 0538  WBC 8.3 6.2  7.8  HGB 9.4* 11.8*  9.5*  HCT 28.4* 37.1  29.6*  PLT 158 146*  178   Recent Labs    12/17/17 1138 12/18/17 0538  NA 138 138  K 4.6 4.7  CL 105 101  GLUCOSE 78 65  BUN 58* 60*  CREATININE 1.58* 1.56*  CALCIUM 10.5* 10.8*   CBG (last 3)  No results for input(s): GLUCAP in the last 72 hours.  Wt Readings from Last 3 Encounters:  12/18/17 96.7 kg (213 lb 3 oz)  12/08/17 88 kg (194 lb 0.1 oz)  11/28/17 73 kg (161 lb)    Physical Exam:  BP (!) 104/43 (BP Location: Right Leg)   Pulse 67   Temp 97.9 F (36.6 C) (Oral)   Resp 14   Ht 5\' 4"  (1.626 m)   Wt 96.7 kg (213 lb 3 oz)   SpO2 94%   BMI 36.59 kg/m  Constitutional: NAD. Obese  HENT: Normocephalic. Atraumatic Eyes: EOM are normal. No discharge.  Cardiovascular: RRR. No JVD.  Respiratory: No respiratory distress. Clear to auscultation  GI: Bowel sounds are normal. She exhibits no distension.  Musculoskeletal: +Edema in all extremities  Neurological: She isalertand oriented. Motor: RUE: Shoulder abduction 1/5, elbow flex/extension 2-/5, hand grip 3+/5 (unchanged) LUE: 5/5 proximal to distal B/L LE: HF 4/5, KE 4/5, ADF/PF 4+/5.  Skin. Warm and dry Psychiatric: She has a normal mood and affect. Her behavior is normal. Judgment and thought content normal.   Assessment/Plan: 1. Functional deficits secondary to diffuse large B-cell non-Hodgkin's lymphoma which require 3+ hours per day of interdisciplinary therapy in  a comprehensive inpatient rehab setting. Physiatrist is providing close team supervision and 24 hour management of active medical problems listed below. Physiatrist and rehab team continue to assess barriers  to discharge/monitor patient progress toward functional and medical goals.  Function:  Bathing Bathing position   Position: Other (comment)(bedside chair)  Bathing parts Body parts bathed by patient: Chest, Abdomen, Right upper leg, Left upper leg Body parts bathed by helper: Left arm, Right lower leg, Left lower leg, Back, Right arm, Front perineal area, Buttocks  Bathing assist Assist Level: 2 helpers   Set up : To open containers, To obtain items  Upper Body Dressing/Undressing Upper body dressing   What is the patient wearing?: Pull over shirt/dress       Pull over shirt/dress - Perfomed by helper: Thread/unthread right sleeve, Thread/unthread left sleeve, Put head through opening, Pull shirt over trunk        Upper body assist Assist Level: Touching or steadying assistance(Pt > 75%)      Lower Body Dressing/Undressing Lower body dressing   What is the patient wearing?: Ted Hose, Non-skid slipper socks           Non-skid slipper socks- Performed by helper: Don/doff right sock, Don/doff left sock               TED Hose - Performed by helper: Don/doff left TED hose, Don/doff right TED hose  Lower body assist        Toileting Toileting Toileting activity did not occur: No continent bowel/bladder event   Toileting steps completed by helper: Adjust clothing prior to toileting, Performs perineal hygiene, Adjust clothing after toileting    Toileting assist Assist level: Two helpers   Transfers Chair/bed transfer   Chair/bed transfer method: Other Chair/bed transfer assist level: 2 helpers Chair/bed transfer assistive device: Mechanical lift Mechanical lift: Ecologist     Max distance: 4 ft Assist level: Touching or steadying assistance (Pt > 75%)   Wheelchair   Type: Manual Max wheelchair distance: (10 ft in room) Assist Level: Moderate assistance (Pt 50 - 74%)  Cognition Comprehension Comprehension assist level: Understands  complex 90% of the time/cues 10% of the time  Expression Expression assist level: Expresses basic needs/ideas: With no assist  Social Interaction Social Interaction assist level: Interacts appropriately 90% of the time - Needs monitoring or encouragement for participation or interaction.  Problem Solving Problem solving assist level: Solves basic 75 - 89% of the time/requires cueing 10 - 24% of the time  Memory Memory assist level: Recognizes or recalls 90% of the time/requires cueing < 10% of the time     Medical Problem List and Plan: 1.  Decreased functional mobility secondary to diffuse large B-cell non-Hodgkin's lymphoma.   Continue CIR    Rituximab weekly initiated next infusion 12/27, plan for second round of steroids on 12/28.   Plan CHOP with infusional doxorubicin as an outpatient per Heme/Onc 2.  DVT Prophylaxis/Anticoagulation:  Monitor for any bleeding episodes   Coumadin restarted, INR subtherapeutic on 12/27 3. Pain Management with brachial plexus impingement due to lymphadenopathy             Neurontin 300 mg 3 times a day, oxycodone as needed 4. Mood: Provide emotional support 5. Neuropsych: This patient is capable of making decisions on her own behalf. 6. Skin/Wound Care: Routine skin checks 7. Fluids/Electrolytes/Nutrition: Routine I&O's  8. Atrial fibrillation. Continue Tambocor 100 mg twice a day, Lopressor 12.5 mg twice a day  Controlled on 12/26 9. Hypertension. Monitor with increased mobility   Lisinopril 20 mg daily d/ced on 12/26   Relatively controlled on 12/27 10. Diastolic congestive heart failure. Monitor for any signs of fluid overload. Weigh patient daily Filed Weights   12/16/17 0600 12/17/17 0540 12/18/17 0244  Weight: 86.5 kg (190 lb 11.2 oz) 89.2 kg (196 lb 10.4 oz) 96.7 kg (213 lb 3 oz)    ?Reliability   Last Echo 2016, reordered, completed, relatively stable, with no significant changes 11. Hypothyroidism. Synthroid 12. Gout. Allopurinol 300 mg  daily. Monitor for any gout flareups 13. Constipation. Laxative assistance 14. RUE lymphedema: see #3, continue elevation, massage, edema mgt 15. Hypoalbuminemia   Supplement initiated on 12/19 16. Acute blood loss anemia   Hemoglobin 11.8 on 12/27   Continue to monitor 17. 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 Hyponatremia: Resolved   Cont to monitor 20. AKI   Cr 1.56 on 12/27   Appreciate IM recs   Encourage fluids   Will need to be mindful of tumor lysis syndrome    Cont to monitor 21. Leukocytosis   UA equivocal, urine culture with multiple species   CXR reviewed, showing improvement   Cont to monitor 22. Thrombocytopenia:    Platelets 146 on 12/27   Continue to monitor 23. Postinfusion fluid overload   Lasix 40 daily started on 12/22, increased dose to 60 for 12/27 24. Urinary retention with incontinence now   DCed Foley, voiding trial   PVRs variable this point 25. Leukocytosis   UA+, Ucx no growth   Empiric keflex d/ced  LOS (Days) 9 A TO FACE EVALUATION WAS PERFORMED  Erykah Lippert Lorie Phenix 12/18/2017 8:52 AM

## 2017-12-18 NOTE — Progress Notes (Signed)
Occupational Therapy Weekly Progress Note  Patient Details  Name: Colleen [name] MRN: [id number] Date of Birth: 1935/11/23  Beginning of progress report period: December 10, 2017 End of progress report period: December 18, 2017  Today's Date: 12/18/2017 OT Individual Time: 1003-1100 OT Individual Time Calculation (min): 57 min    Patient has met 0 of 5 short term goals.  Colleen [name] has made slow progress with OT at this time.  She needs max assist for all UB bathing and dressing as well as total assist to total +2 for LB bathing and dressing sit to stand.  Static sitting balance is at a max assist from the bedside chair, where pt is performing most selfcare.  Increased posterior lean as well as left lean is noted in trunk, with head flexion to the right.  Pt is unable to reach her RUE for thorough bathing with the left hand, and currently does not tolerate using the RUE for any bathing secondary to shoulder pain with movement.  AAROM of the right shoulder is about 0-50 degrees before it becomes too painful.  She can demonstrate AAROM [facility] for the elbow but strength is only 2+/5.  Digit AROM is WFLS, however increased edema is noted in the hand and forearm at this time.  Colleen [name] continues to need total assist for transfers to the 3:1 with total +2 (pt 25%) for clothing and toilet hygiene.  Feel based on her current diagnosis and progress over the past week that she will need extensive assist at discharge.  Feel SNF may be needed for follow-up.  Will continue with current OT treatment plan but will downgrade goals accordingly    Patient continues to demonstrate the following deficits: muscle weakness, unbalanced muscle activation and decreased coordination and decreased initiation and delayed processing and therefore will continue to benefit from skilled OT intervention to enhance overall performance with BADL and Reduce care partner burden.  Patient not progressing toward long term goals.  See  goal revision..  Continue plan of care.  OT Short Term Goals Week 2:  OT Short Term Goal 1 (Week 2): Pt will maintain static sitting during selfcare tasks with mod assist.  OT Short Term Goal 2 (Week 2): Pt will perform UB bathing with mod assist using AE PRN. OT Short Term Goal 3 (Week 2): Pt will perform stand pivot toilet transfer to the 3:1 with mod assist.  OT Short Term Goal 4 (Week 2): Pt will tolerate AAROM of the right shoulder 0-70 degrees for edema control and for greater use with selfcare tasks.    Skilled Therapeutic Interventions/Progress Updates:    Pt worked on bathing and dressing sitting in [place].  Max assist for static sitting balance while bathing throughout the session.  Increased lean to the left and LOB throughout session.  When positioned more upright and asked to reach down to her feet she could get down to just below the knee, but then needed max assist to come back upright.  Decreased thoroughness throughout bathing, even with use of the LUE.  Did not attempt RUE use secondary to pain with AAROM as well as edema.  Donned pullover gown as well with total assist including sequencing.  Therapist provided total assist for washing her lower legs as well as donning TEDs and gripper socks.  Sit to stand X 1 from the bedside chair with max assist.  Pt left with LEs elevated in the chair and pillows positioning her trunk at midline.  Call button and  phone in reach.    Therapy Documentation Precautions:  Precautions Precautions: Fall, Other (comment) Precaution Comments: Chemo; BP only in LE; incontinent with mobility; R UE pain/lymphedema Restrictions Weight Bearing Restrictions: No  Pain: Pain Assessment Pain Assessment: Faces Faces Pain Scale: Hurts little more Pain Type: Acute pain Pain Location: Arm Pain Orientation: Right Pain Descriptors / Indicators: Discomfort Pain Onset: With Activity Pain Intervention(s): Repositioned ADL: See Function Navigator for  Current Functional Status.   Therapy/Group: Individual Therapy  Daric Koren OTR/L 12/18/2017, 12:42 PM

## 2017-12-18 NOTE — Progress Notes (Signed)
Progress Note    Colleen [name]  RXV:[id number] DOB: 09/26/1935  DOA: 12/09/2017 PCP: Ann Held, DO    Brief Narrative:   Reason for consult: Evaluate AKI in the setting of multiple comorbidities  Medical records reviewed and are as summarized below:  Colleen [name] is an 82 y.o. female with a PMH of large B-cell non-Hodgkin's lymphoma on rituximab, chronic atrial fibrillation on Coumadin, hypertension, chronic diastolic CHF, hypothyroidism, and CVA 11/29/17 currently undergoing rehabilitation on the CIR unit.   Assessment/Plan:   Active Problems:   AKI (acute kidney injury) ([place]) Lisinopril on hold. Baseline creatinine appears to be around 0.6-0.8. Would consult with oncologist as Rituxan can sometimes cause nephrotoxicity.    Large B-cell non-Hodgkin's lymphoma/lymphedema Receiving weekly rituximab with next scheduled dose for today. CHOP chemotherapy planned as an outpatient per hematology/oncology. Continue prednisone post Rituxan infusion. Continue allopurinol for prophylaxis of tumor lysis syndrome.    Atrial fibrillation Continue Coumadin per pharmacy. Continue flecainide, Lopressor 12.5 twice daily.    Debility Continue rehabilitation.    Chronic diastolic CHF 2-D echo done 12/13/17 showed grade 1 diastolic dysfunction with preserved EF. Lasix increased to 60 mg daily 12/17/17. Creatinine stable with increased dose Lasix.    Hypoalbuminemia due to protein-calorie malnutrition ([place]) Continue nutritional support.    Hypercalcemia Secondary to malignancy. Status post calcitonin and pamidronate. Continue to monitor and treat as needed. Calcium trending up.    HSV (herpes simplex virus) anogenital infection Status post 7 days of Valtrex. We'll decrease dose to 500 mg daily.    Urinary retention Monitoring post void residual.    Obesity Body mass index is 36.59 kg/m.   Family Communication/Anticipated D/C date and plan/Code Status   DVT  prophylaxis: Coumadin ordered. Code Status: Full Code.  Family Communication: Updated at the bedside. Disposition Plan: Per primary team.   Subjective:   Colleen [name] reports that she feels poorly today. Denies any chest pain, has mild SOB, denies nausea, vomiting.  Appetite is poor.  Objective:    Vitals:   12/17/17 1025 12/17/17 1400 12/17/17 1612 12/18/17 0244  BP: (!) 107/40 (!) 113/45 (!) 117/59 (!) 104/43  Pulse: 70 65 69 67  Resp: 14 14    Temp:  98.1 F (36.7 C)  97.9 F (36.6 C)  TempSrc:  Oral  Oral  SpO2: 97% 95% 95% 94%  Weight:    96.7 kg (213 lb 3 oz)  Height:        Intake/Output Summary (Last 24 hours) at 12/18/2017 0801 Last data filed at 12/17/2017 1735 Gross per 24 hour  Intake 240 ml  Output -  Net 240 ml   Filed Weights   12/16/17 0600 12/17/17 0540 12/18/17 0244  Weight: 86.5 kg (190 lb 11.2 oz) 89.2 kg (196 lb 10.4 oz) 96.7 kg (213 lb 3 oz)    Exam: General: No acute distress. Cardiovascular: Heart sounds show a regular rate, and rhythm. No gallops or rubs. No murmurs. No JVD. Lungs: Clear to auscultation bilaterally with good air movement. No rales, rhonchi or wheezes. Abdomen: Soft, nontender, nondistended with normal active bowel sounds. No masses. No hepatosplenomegaly. Neurological: Alert and oriented 3. Moves all extremities 4 with decreased strength of RUE. Cranial nerves II through XII grossly intact. Skin: Warm and dry. No rashes or lesions. Extremities: No clubbing or cyanosis. Edema to UE/LE 2-3+. Pedal pulses 2+. Psychiatric: Mood and affect are normal. Insight and judgment are normal.   Data Reviewed:  I have personally reviewed following labs and imaging studies:  Labs: Labs show the following:   Basic Metabolic Panel: Recent Labs  Lab 12/13/17 0519 12/14/17 1212 12/16/17 0712 12/17/17 1138 12/18/17 0538  NA 134* 135 136 138 138  K 4.4 4.3 4.7 4.6 4.7  CL 101 101 99* 105 101  CO2 26 25 24 23 27   GLUCOSE 104*  75 70 78 65  BUN 42* 42* 56* 58* 60*  CREATININE 1.33* 1.34* 2.02* 1.58* 1.56*  CALCIUM 11.1* 10.9* 11.1* 10.5* 10.8*   GFR Estimated Creatinine Clearance: 32.5 mL/min (A) (by C-G formula based on SCr of 1.56 mg/dL (H)). Liver Function Tests: Recent Labs  Lab 12/14/17 1212 12/16/17 0712 12/18/17 0538  AST 54* 48* 46*  ALT 43 36 23  ALKPHOS 106 101 88  BILITOT 0.8 0.9 0.9  PROT 4.8* 5.1* 4.8*  ALBUMIN 2.3* 2.4* 2.1*   No results for input(s): LIPASE, AMYLASE in the last 168 hours. No results for input(s): AMMONIA in the last 168 hours. Coagulation profile Recent Labs  Lab 12/14/17 1212 12/15/17 0600 12/16/17 0712 12/17/17 0542 12/18/17 0538  INR 2.16 1.69 1.52 1.52 1.39    CBC: Recent Labs  Lab 12/13/17 0519 12/14/17 1212 12/15/17 0600 12/16/17 0712 12/17/17 0542 12/18/17 0538  WBC 6.4 8.4 8.7 11.8* 8.3 6.2  7.8  NEUTROABS 5.6 6.9  --  10.4*  --  5.1  HGB 9.0* 10.4* 10.4* 10.3* 9.4* 11.8*  9.5*  HCT 27.1* 31.2* 32.2* 32.2* 28.4* 37.1  29.6*  MCV 102.3* 103.7* 107.0* 107.3* 106.8* 108.2*  108.4*  PLT 124* 155 162 173 158 146*  178   Sepsis Labs: Recent Labs  Lab 12/15/17 0600 12/16/17 0712 12/17/17 0542 12/18/17 0538  WBC 8.7 11.8* 8.3 6.2  7.8    Microbiology Recent Results (from the past 240 hour(s))  Urine Culture     Status: Abnormal   Collection Time: 12/12/17  1:31 PM  Result Value Ref Range Status   Specimen Description URINE, CATHETERIZED  Final   Special Requests NONE  Final   Culture MULTIPLE SPECIES PRESENT, SUGGEST RECOLLECTION (A)  Final   Report Status 12/13/2017 FINAL  Final  Urine Culture     Status: None   Collection Time: 12/16/17  3:04 PM  Result Value Ref Range Status   Specimen Description URINE, CLEAN CATCH  Final   Special Requests NONE  Final   Culture NO GROWTH  Final   Report Status 12/17/2017 FINAL  Final  Surgical PCR screen     Status: None   Collection Time: 12/17/17  5:08 AM  Result Value Ref Range Status     MRSA, PCR NEGATIVE NEGATIVE Final   Staphylococcus aureus NEGATIVE NEGATIVE Final    Comment: (NOTE) The Xpert SA Assay (FDA approved for NASAL specimens in patients 82 years of age and older), is one component of a comprehensive surveillance program. It is not intended to diagnose infection nor to guide or monitor treatment.     Procedures and diagnostic studies:  Ir US Guide Vasc Access Right  Result Date: 12/17/2017 CLINICAL DATA:  Lymphoma. Needs durable venous access for planned chemotherapy regimen. EXAM: TUNNELED PORT CATHETER PLACEMENT WITH ULTRASOUND AND FLUOROSCOPIC GUIDANCE FLUOROSCOPY TIME:  0.1 minutes  ; 12  uGym2 DAP ANESTHESIA/SEDATION: Intravenous Fentanyl and Versed were administered as conscious sedation during continuous monitoring of the patient's level of consciousness and physiological / cardiorespiratory status by the radiology RN, with a total moderate sedation time of 15 minutes. TECHNIQUE:  The procedure, risks, benefits, and alternatives were explained to the patient. Questions regarding the procedure were encouraged and answered. The patient understands and consents to the procedure. As antibiotic prophylaxis, cefazolin 2 g was ordered pre-procedure and administered intravenously within one hour of incision. External compression of the right IJ vein secondary to adenopathy was noted, corresponding to findings from recent CT chest. Patency of the right external jugular vein was identified. Patency of the right EJ vein was confirmed with ultrasound with image documentation. An appropriate skin site was determined. Skin site was marked. Region was prepped using maximum barrier technique including cap and mask, sterile gown, sterile gloves, large sterile sheet, and Chlorhexidine as cutaneous antisepsis. The region was infiltrated locally with 1% lidocaine. Under real-time ultrasound guidance, the right EJ vein was accessed with a 21 gauge micropuncture needle; the needle tip  within the vein was confirmed with ultrasound image documentation. Needle was exchanged over a 018 guidewire for transitional dilator which allowed passage of the [facility] wire into the IVC. Over this, the transitional dilator was exchanged for a 5 [place] MPA catheter. A small incision was made on the right anterior chest wall and a subcutaneous pocket fashioned. The power-injectable port was positioned and its catheter tunneled to the right EJ dermatotomy site. The MPA catheter was exchanged over an Amplatz wire for a peel-away sheath, through which the port catheter, which had been trimmed to the appropriate length, was advanced and positioned under fluoroscopy with its tip at the cavoatrial junction. Spot chest radiograph confirms good catheter position and no pneumothorax. The pocket was closed with deep interrupted and subcuticular continuous 3-0 Monocryl sutures. The port was flushed per protocol. The incisions were covered with Dermabond then covered with a sterile dressing. COMPLICATIONS: COMPLICATIONS None immediate IMPRESSION: Technically successful right EJ power-injectable port catheter placement. Ready for routine use. Electronically Signed   By: Lucrezia Europe M.D.   On: 12/17/2017 14:29   Ir Fluoro Guide Port Insertion Right  Result Date: 12/17/2017 CLINICAL DATA:  Lymphoma. Needs durable venous access for planned chemotherapy regimen. EXAM: TUNNELED PORT CATHETER PLACEMENT WITH ULTRASOUND AND FLUOROSCOPIC GUIDANCE FLUOROSCOPY TIME:  0.1 minutes  ; 12  uGym2 DAP ANESTHESIA/SEDATION: Intravenous Fentanyl and Versed were administered as conscious sedation during continuous monitoring of the patient's level of consciousness and physiological / cardiorespiratory status by the radiology RN, with a total moderate sedation time of 15 minutes. TECHNIQUE: The procedure, risks, benefits, and alternatives were explained to the patient. Questions regarding the procedure were encouraged and answered. The patient  understands and consents to the procedure. As antibiotic prophylaxis, cefazolin 2 g was ordered pre-procedure and administered intravenously within one hour of incision. External compression of the right IJ vein secondary to adenopathy was noted, corresponding to findings from recent CT chest. Patency of the right external jugular vein was identified. Patency of the right EJ vein was confirmed with ultrasound with image documentation. An appropriate skin site was determined. Skin site was marked. Region was prepped using maximum barrier technique including cap and mask, sterile gown, sterile gloves, large sterile sheet, and Chlorhexidine as cutaneous antisepsis. The region was infiltrated locally with 1% lidocaine. Under real-time ultrasound guidance, the right EJ vein was accessed with a 21 gauge micropuncture needle; the needle tip within the vein was confirmed with ultrasound image documentation. Needle was exchanged over a 018 guidewire for transitional dilator which allowed passage of the [facility] wire into the IVC. Over this, the transitional dilator was exchanged for a 5 [place] MPA  catheter. A small incision was made on the right anterior chest wall and a subcutaneous pocket fashioned. The power-injectable port was positioned and its catheter tunneled to the right EJ dermatotomy site. The MPA catheter was exchanged over an Amplatz wire for a peel-away sheath, through which the port catheter, which had been trimmed to the appropriate length, was advanced and positioned under fluoroscopy with its tip at the cavoatrial junction. Spot chest radiograph confirms good catheter position and no pneumothorax. The pocket was closed with deep interrupted and subcuticular continuous 3-0 Monocryl sutures. The port was flushed per protocol. The incisions were covered with Dermabond then covered with a sterile dressing. COMPLICATIONS: COMPLICATIONS None immediate IMPRESSION: Technically successful right EJ power-injectable  port catheter placement. Ready for routine use. Electronically Signed   By: Lucrezia Europe M.D.   On: 12/17/2017 14:29    Medications:   . allopurinol  300 mg Oral Daily  . cephALEXin  250 mg Oral Q12H  . feeding supplement (PRO-STAT SUGAR FREE 64)  30 mL Oral BID  . flecainide  100 mg Oral BID  . furosemide  40 mg Oral Daily  . gabapentin  300 mg Oral TID  . Gerhardt's butt cream   Topical BID  . levothyroxine  88 mcg Oral QAC breakfast  . metoprolol tartrate  12.5 mg Oral BID  . polyethylene glycol  17 g Oral Daily  . senna-docusate  1 tablet Oral QHS  . valACYclovir  500 mg Oral TID  . Warfarin - Pharmacist Dosing Inpatient   Does not apply q1800   Continuous Infusions: . sodium chloride    . famotidine       LOS: 9 days   Jacquelynn Cree  [facility] Pager [phone]. If unable to reach me by pager, please call my cell phone at [phone].  *Please refer to [email or web address], password [email or web address] to get updated schedule on who will round on this patient, as hospitalists switch teams weekly. If 7PM-7AM, please contact night-coverage at [email or web address], password [email or web address] for any overnight needs.  12/18/2017, 8:01 AM

## 2017-12-19 ENCOUNTER — Inpatient Hospital Stay (HOSPITAL_COMMUNITY): Payer: Medicare Other | Admitting: Physical Therapy

## 2017-12-19 ENCOUNTER — Inpatient Hospital Stay (HOSPITAL_COMMUNITY): Payer: Medicare Other

## 2017-12-19 ENCOUNTER — Inpatient Hospital Stay (HOSPITAL_COMMUNITY): Payer: Medicare Other | Admitting: Occupational Therapy

## 2017-12-19 DIAGNOSIS — I4891 Unspecified atrial fibrillation: Secondary | ICD-10-CM

## 2017-12-19 DIAGNOSIS — R531 Weakness: Secondary | ICD-10-CM

## 2017-12-19 DIAGNOSIS — R600 Localized edema: Secondary | ICD-10-CM

## 2017-12-19 DIAGNOSIS — R609 Edema, unspecified: Secondary | ICD-10-CM

## 2017-12-19 LAB — URINALYSIS, ROUTINE W REFLEX MICROSCOPIC
BILIRUBIN URINE: NEGATIVE
Glucose, UA: NEGATIVE mg/dL
Hgb urine dipstick: NEGATIVE
KETONES UR: NEGATIVE mg/dL
Nitrite: NEGATIVE
Protein, ur: NEGATIVE mg/dL
SPECIFIC GRAVITY, URINE: 1.012 (ref 1.005–1.030)
Squamous Epithelial / LPF: NONE SEEN
pH: 5 (ref 5.0–8.0)

## 2017-12-19 LAB — CBC
HEMATOCRIT: 30.2 % — AB (ref 36.0–46.0)
HEMOGLOBIN: 9.6 g/dL — AB (ref 12.0–15.0)
MCH: 35.2 pg — ABNORMAL HIGH (ref 26.0–34.0)
MCHC: 31.8 g/dL (ref 30.0–36.0)
MCV: 110.6 fL — ABNORMAL HIGH (ref 78.0–100.0)
Platelets: 196 10*3/uL (ref 150–400)
RBC: 2.73 MIL/uL — AB (ref 3.87–5.11)
RDW: 17.4 % — ABNORMAL HIGH (ref 11.5–15.5)
WBC: 7.3 10*3/uL (ref 4.0–10.5)

## 2017-12-19 LAB — BASIC METABOLIC PANEL
Anion gap: 14 (ref 5–15)
BUN: 70 mg/dL — AB (ref 6–20)
CALCIUM: 10.9 mg/dL — AB (ref 8.9–10.3)
CHLORIDE: 97 mmol/L — AB (ref 101–111)
CO2: 27 mmol/L (ref 22–32)
CREATININE: 1.64 mg/dL — AB (ref 0.44–1.00)
GFR calc non Af Amer: 28 mL/min — ABNORMAL LOW (ref >60)
GFR, EST AFRICAN AMERICAN: 33 mL/min — AB (ref >60)
Glucose, Bld: 71 mg/dL (ref 65–99)
Potassium: 5.2 mmol/L — ABNORMAL HIGH (ref 3.5–5.1)
SODIUM: 138 mmol/L (ref 135–145)

## 2017-12-19 LAB — PROTIME-INR
INR: 1.49
PROTHROMBIN TIME: 17.9 s — AB (ref 11.4–15.2)

## 2017-12-19 MED ORDER — PREDNISONE 20 MG PO TABS
60.0000 mg | ORAL_TABLET | Freq: Every day | ORAL | Status: AC
  Administered 2017-12-19 – 2017-12-23 (×5): 60 mg via ORAL
  Filled 2017-12-19 (×5): qty 3

## 2017-12-19 MED ORDER — WARFARIN SODIUM 5 MG PO TABS
5.0000 mg | ORAL_TABLET | Freq: Once | ORAL | Status: AC
  Administered 2017-12-19: 5 mg via ORAL

## 2017-12-19 NOTE — Progress Notes (Signed)
[place] for Warfarin Indication: atrial fibrillation  No Known Allergies  Patient Measurements: Height: 5\' 4"  (162.6 cm) Weight: 199 lb 15.3 oz (90.7 kg) IBW/kg (Calculated) : 54.7   Vital Signs: Temp: 99 F (37.2 C) (12/28 0421) Temp Source: Oral (12/28 0421) BP: 113/47 (12/28 0421) Pulse Rate: 67 (12/28 0421)  Labs: Recent Labs    12/17/17 0542 12/17/17 1138 12/18/17 0538 12/19/17 0414 12/19/17 0944  HGB 9.4*  --  11.8*  9.5* 9.6*  --   HCT 28.4*  --  37.1  29.6* 30.2*  --   PLT 158  --  146*  178 196  --   LABPROT 18.1*  --  16.9* 17.9*  --   INR 1.52  --  1.39 1.49  --   CREATININE  --  1.58* 1.56*  --  1.64*   Estimated Creatinine Clearance: 29.8 mL/min (A) (by C-G formula based on SCr of 1.64 mg/dL (H)).  Assessment: 82 y/o female admitted with lymphadenopathy requiring biopsy - diagnosed with diffuse large B-cell non Hodgkin's Lymphoma. She is on chronic warfarin for history of afib.    Warfarin had been on hold for porta cath placement. S/p  R EJ port catheter placed  12/26 and warfarin restarted 12/26.  INR 1.49 and subtherapeutic. No bleeding noted.  Hgb low stable 9s, platelets are normal  Home warfarin dose: 2.5mg  daily except 5mg  MF (INR was 1.91 on 12/7)   Goal of Therapy:  INR 2-3 Monitor platelets by anticoagulation protocol: Yes   Plan:  -Warfarin 5 mg PO tonight -Daily INR -Monitor for s/sx bleeding   Renold Genta, PharmD, BCPS Clinical Pharmacist Phone for today - [place] - [phone] 12/19/2017 11:04 AM

## 2017-12-19 NOTE — Progress Notes (Signed)
PROGRESS NOTE  Colleen [name] TGG:[id number] DOB: 1935/06/09 DOA: 12/09/2017 PCP: Ann Held, DO  Brief Narrative: 82yow presented on 11/29/2017 planes of swelling and pain right arm as well as mass in her right neck and was found to have rapidly developing diffuse lymphadenopathy.  Diffuse large B cell non-Hodgkin's lymphoma was confirmed on lymph node biopsy on 12/02/17. Discharged to [facility] 12/18. Medicine consulted given AKI on BMP from 12/16/2017 with creatinine of 2  Assessment/Plan AKI. New since 12/25. ACE-I on hold. Urinary retention noted in note yesterday. - no better today. BUN/creatinine slightly higher, suggest pre or postrenal. - modest hyperkalemia noted - check renal u/s, u/a, repeat BMP in AM - place foley, initiate strict I/O - wonder if immunotherapy contributing  BLE edema. Etiology unclear. Echo 11/2017 with normal LVEF. 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 - per oncology  Afib - continue flecainide, Lopressor, warfarin  Hospitalized 12/8-12/18 for diffuse large B-cell NHL, right arm lymphedema,    Murray Hodgkins, MD  [facility] Direct contact: [phone] --Via amion app OR  --[email or web address]; password [email or web address]  7PM-7AM contact night coverage as above 12/19/2017, 5:41 PM  LOS: 10 days    Procedures:  12.26 Right EJ port placement   Interval history/Subjective: Feels ok, has significant LE and RUE edema. Frequent urination, urinary incontinence when standing up, no dysuria or hesitancy.   Objective: Vitals:  Vitals:   12/19/17 0421 12/19/17 1444  BP: (!) 113/47 (!) 128/55  Pulse: 67 60  Resp: 16 16  Temp: 99 F (37.2 C)   SpO2: 99% 94%    Exam:  Constitutional:  . Appears calm and comfortable ENMT:  . grossly normal hearing  . Lips  appear normal Respiratory:  . CTA bilaterally, no w/r/r.  . Respiratory effort normal.  Cardiovascular:  . RRR, no m/r/g . 2-3+ BLE extremity edema , 2+ RUE edema Psychiatric:  . Mental status o Mood, affect appropriate . judgement and insight appear normal    I have personally reviewed the following:  Filed Weights   12/17/17 0540 12/18/17 0244 12/19/17 0421  Weight: 89.2 kg (196 lb 10.4 oz) 96.7 kg (213 lb 3 oz) 90.7 kg (199 lb 15.3 oz)   Weight change: -6 kg (-3.6 oz)  UOP: multiple voides I/O since admission: inaccurate Last BM charted: 12/27  Labs:  BUN 60 > 70  Creatinine 1.56 >> 1.64  K+ 5.2   Scheduled Meds: . allopurinol  300 mg Oral Daily  . feeding supplement (PRO-STAT SUGAR FREE 64)  30 mL Oral BID  . flecainide  100 mg Oral BID  . furosemide  20 mg Oral Once  . furosemide  40 mg Oral Daily  . gabapentin  300 mg Oral TID  . Gerhardt's butt cream   Topical BID  . levothyroxine  88 mcg Oral QAC breakfast  . metoprolol tartrate  12.5 mg Oral BID  . polyethylene glycol  17 g Oral Daily  . predniSONE  60 mg Oral Q breakfast  . senna-docusate  1 tablet Oral QHS  . valACYclovir  500 mg Oral Daily  . warfarin  5 mg Oral ONCE-1800  . Warfarin - Pharmacist Dosing Inpatient   Does not apply q1800   Continuous Infusions: . sodium chloride    . famotidine      Active Problems:  AKI (acute kidney injury) ([place])   Debility   Lymphedema   Hypoalbuminemia due to protein-calorie malnutrition ([facility])   Hypercalcemia   HSV (herpes simplex virus) anogenital infection   Leukocytosis   SOB (shortness of breath)   Hypervolemia   Urinary retention   Urinary incontinence   Peripheral edema   LOS: 10 days

## 2017-12-19 NOTE — Progress Notes (Signed)
Colleen [name]   DOB:April 06, 1935   ZD#:[id number]   QVZ#:[id number]  Subjective: Colleen [name] is very discouraged; she feels she is going backwards, not forwards; she feels very weak; she hurts "all over." She tells me her mouth is very dry and she can barely talk. Has better control of BMs and more use of her left arm, but her Right arm is still "useless." No family in room  Objective: Elderly white woman examined in bed Vitals:   12/18/17 1500 12/19/17 0421  BP: (!) 120/56 (!) 113/47  Pulse: 72 67  Resp: 16 16  Temp: 98.1 F (36.7 C) 99 F (37.2 C)  SpO2: 96% 99%    Body mass index is 34.32 kg/m.  Intake/Output Summary (Last 24 hours) at 12/19/2017 0734 Last data filed at 12/18/2017 1700 Gross per 24 hour  Intake 240 ml  Output -  Net 240 ml    Right [place] adenopathy as previously noted Lungs no rales or rhonchi, auscultated anterolaterally Heart regular rate and rhythm Abd soft, obese, nontender, positive bowel sounds Neuro: nonfocal, well oriented, appropriate affect Breasts: Deferred   CBG (last 3)  No results for input(s): GLUCAP in the last 72 hours.   Labs:  Lab Results  Component Value Date   WBC 7.3 12/19/2017   HGB 9.6 (L) 12/19/2017   HCT 30.2 (L) 12/19/2017   MCV 110.6 (H) 12/19/2017   PLT 196 12/19/2017   NEUTROABS 5.1 12/18/2017    @LASTCHEMISTRY @  Urine Studies No results for input(s): UHGB, CRYS in the last 72 hours.  Invalid input(s): UACOL, UAPR, USPG, UPH, UTP, UGL, [place], UBIL, UNIT, UROB, [place], UEPI, UWBC, Duwayne Heck [place], [place]  Basic Metabolic Panel: Recent Labs  Lab 12/13/17 0519 12/14/17 1212 12/16/17 0712 12/17/17 1138 12/18/17 0538  NA 134* 135 136 138 138  K 4.4 4.3 4.7 4.6 4.7  CL 101 101 99* 105 101  CO2 26 25 24 23 27   GLUCOSE 104* 75 70 78 65  BUN 42* 42* 56* 58* 60*  CREATININE 1.33* 1.34* 2.02* 1.58* 1.56*  CALCIUM 11.1* 10.9* 11.1* 10.5* 10.8*   GFR Estimated Creatinine Clearance: 31.4 mL/min (A) (by C-G formula  based on SCr of 1.56 mg/dL (H)). Liver Function Tests: Recent Labs  Lab 12/14/17 1212 12/16/17 0712 12/18/17 0538  AST 54* 48* 46*  ALT 43 36 23  ALKPHOS 106 101 88  BILITOT 0.8 0.9 0.9  PROT 4.8* 5.1* 4.8*  ALBUMIN 2.3* 2.4* 2.1*   No results for input(s): LIPASE, AMYLASE in the last 168 hours. No results for input(s): AMMONIA in the last 168 hours. Coagulation profile Recent Labs  Lab 12/15/17 0600 12/16/17 0712 12/17/17 0542 12/18/17 0538 12/19/17 0414  INR 1.69 1.52 1.52 1.39 1.49    CBC: Recent Labs  Lab 12/13/17 0519 12/14/17 1212 12/15/17 0600 12/16/17 0712 12/17/17 0542 12/18/17 0538 12/19/17 0414  WBC 6.4 8.4 8.7 11.8* 8.3 6.2  7.8 7.3  NEUTROABS 5.6 6.9  --  10.4*  --  5.1  --   HGB 9.0* 10.4* 10.4* 10.3* 9.4* 11.8*  9.5* 9.6*  HCT 27.1* 31.2* 32.2* 32.2* 28.4* 37.1  29.6* 30.2*  MCV 102.3* 103.7* 107.0* 107.3* 106.8* 108.2*  108.4* 110.6*  PLT 124* 155 162 173 158 146*  178 196   Cardiac Enzymes: No results for input(s): CKTOTAL, CKMB, CKMBINDEX, TROPONINI in the last 168 hours. BNP: Invalid input(s): POCBNP CBG: No results for input(s): GLUCAP in the last 168 hours. D-Dimer No results for input(s): DDIMER in  the last 72 hours. Hgb A1c No results for input(s): HGBA1C in the last 72 hours. Lipid Profile No results for input(s): CHOL, HDL, LDLCALC, TRIG, CHOLHDL, LDLDIRECT in the last 72 hours. Thyroid function studies No results for input(s): TSH, T4TOTAL, T3FREE, THYROIDAB in the last 72 hours.  Invalid input(s): FREET3 Anemia work up No results for input(s): VITAMINB12, FOLATE, FERRITIN, TIBC, IRON, RETICCTPCT in the last 72 hours. Microbiology Recent Results (from the past 240 hour(s))  Urine Culture     Status: Abnormal   Collection Time: 12/12/17  1:31 PM  Result Value Ref Range Status   Specimen Description URINE, CATHETERIZED  Final   Special Requests NONE  Final   Culture MULTIPLE SPECIES PRESENT, SUGGEST RECOLLECTION (A)   Final   Report Status 12/13/2017 FINAL  Final  Urine Culture     Status: None   Collection Time: 12/16/17  3:04 PM  Result Value Ref Range Status   Specimen Description URINE, CLEAN CATCH  Final   Special Requests NONE  Final   Culture NO GROWTH  Final   Report Status 12/17/2017 FINAL  Final  Surgical PCR screen     Status: None   Collection Time: 12/17/17  5:08 AM  Result Value Ref Range Status   MRSA, PCR NEGATIVE NEGATIVE Final   Staphylococcus aureus NEGATIVE NEGATIVE Final    Comment: (NOTE) The Xpert SA Assay (FDA approved for NASAL specimens in patients 80 years of age and older), is one component of a comprehensive surveillance program. It is not intended to diagnose infection nor to guide or monitor treatment.       Studies:  Dg Chest 2 View  Result Date: 12/12/2017 CLINICAL DATA:  Follow-up evaluation for leukocytosis. Right arm swelling. History of CHF, hypertension, TIA, coronary artery disease, atrial fibrillation. EXAM: CHEST  2 VIEW COMPARISON:  Chest x-rays dated 12/11/2017 and 08/14/2015. Chest CT dated 11/29/2017. FINDINGS: Improved aeration within the right upper lung compared to yesterday's chest x-ray suggesting improved fluid status. No new lung findings. Cardiomediastinal silhouette is stable in size and configuration compared to yesterday's exam, compatible with the extensive mediastinal lymphadenopathy demonstrated on recent CT. Lateral view is suboptimal all. I cannot exclude small pleural effusion on the lateral view. IMPRESSION: 1. Improved aeration within the right upper lobe compared to yesterday's chest x-ray, likely indicating improved fluid status. Lungs otherwise clear. Questionable pleural effusion on the lateral projection. 2. Abnormal configuration of the cardiomediastinal silhouette, compatible with the extensive mediastinal and perihilar lymphadenopathy better demonstrated on recent CT chest of 11/29/2017. Electronically Signed   By: Colleen [name]  M.D.   On: 12/12/2017 16:02   Ct Soft Tissue Neck Wo Contrast  Result Date: 11/29/2017 CLINICAL DATA:  82 y/o F; patient undergoing evaluation for lymphadenopathy with biopsy in the left subclavian area straightening, now presenting with swelling of the left arm and chest with associated severe pain. EXAM: CT NECK WITHOUT CONTRAST TECHNIQUE: Multidetector CT imaging of the neck was performed following the standard protocol without intravenous contrast. COMPARISON:  11/24/2017 CT of the neck. FINDINGS: Pharynx and larynx: Normal. No mass or swelling. Salivary glands: No inflammation, mass, or stone. Thyroid: Normal. Lymph nodes: Interval progression of diffuse lymphadenopathy, for example a left level 5B lymph node measures 12 x 12 mm, previously 6 mm (series 3, image 88). Increased lymphadenopathy is best appreciated left posterior cervical chain and left upper axilla but increase in adenopathy is present diffusely, for example a right upper peritracheal node measuring 24 x 31 mm,  previously 17 x 23 mm (series 3, image 121). Fat stranding surrounding conglomerate lymph nodes throughout the right cervical chain, right greater than left supraclavicular regions, and left axilla may represent infiltrative neoplasm or lymphedema. Vascular: Calcific atherosclerosis of the aorta and carotid siphons. Limited intracranial: Negative. Visualized orbits: Negative. Mastoids and visualized paranasal sinuses: Small left maxillary sinus mucous retention cyst. Otherwise negative. Skeleton: Stable mild cervical spondylosis. No high-grade bony canal stenosis. Upper chest: Emphysema and scarring in bilateral upper lobes is stable. Other: None. IMPRESSION: Interval progression of lymphadenopathy from 11/24/2017 best appreciated in the left cervical level 5B station, but measurable increase is present diffusely. Findings indicate rapidly progressive lymphoproliferative or metastatic disease. Correlation with biopsy is recommended.  Electronically Signed   By: Kristine Garbe M.D.   On: 11/29/2017 22:54   Ct Soft Tissue Neck W Contrast  Result Date: 11/24/2017 CLINICAL DATA:  RIGHT neck and ear pain, enlarging RIGHT supraclavicular and retroauricular mass for 2 weeks. LEFT facial swelling. History of carotid artery surgery. EXAM: CT NECK WITH CONTRAST TECHNIQUE: Multidetector CT imaging of the neck was performed using the standard protocol following the bolus administration of intravenous contrast. CONTRAST:  17mL ISOVUE-300 IOPAMIDOL (ISOVUE-300) INJECTION 61% COMPARISON:  CT chest September 11, 2015 FINDINGS: PHARYNX AND LARYNX: Normal.  Widely patent airway. SALIVARY GLANDS: Normal. THYROID: Normal. LYMPH NODES: Multilevel severe RIGHT cervical lymphadenopathy. 27 mm RIGHT level 1 B lymph node, round RIGHT level IIa 23 mm lymph node, 3.1 cm RIGHT supraclavicular lymph node with pericapsular fat stranding RIGHT neck lymphadenopathy. Mild LEFT neck lymphadenopathy including 11 mm intraparotid lymph nodes and 14 mm LEFT level IIa lymph node. VASCULAR: Normal. LIMITED INTRACRANIAL: Normal. VISUALIZED ORBITS: Surgical clips LEFT neck most compatible with endarterectomy. Severe calcific atherosclerosis RIGHT carotid bifurcation. Lymphadenopathy narrows RIGHT internal jugular vein which remains patent. MASTOIDS AND VISUALIZED PARANASAL SINUSES: Well-aerated. SKELETON: Nonacute. No destructive bony lesions. Patient is edentulous. UPPER CHEST: RIGHT apical bullous changes and scarring. Partially imaged bilateral hilar lymphadenopathy. Mediastinal lymphadenopathy including 2 cm aortopulmonary window lymph node, 17 mm pretracheal lymph node. LEFT greater than RIGHT axillary lymphadenopathy, at least 4 cm nodal conglomeration with LEFT retropectoral infiltrative mass. Heart size is probably enlarged. Severe coronary artery calcifications. OTHER: IMPRESSION: 1. Severe RIGHT and mild LEFT neck lymphadenopathy. Severe LEFT axillary  lymphadenopathy with lymph edema versus lymphangitic spread of tumor. LEFT retropectoral infiltrative mass. Mediastinal lymphadenopathy. Constellation of findings seen with lymphoproliferative disease, metastatic disease such as breast cancer given LEFT chest findings. Recommend CT chest with contrast versus PET-CT. 2. These results will be called to the ordering clinician or representative by the Radiologist Assistant, and communication documented in the PACS or zVision Dashboard. Aortic Atherosclerosis (ICD10-I70.0). Electronically Signed   By: Elon Alas M.D.   On: 11/24/2017 20:19   Ct Chest W Contrast  Result Date: 11/29/2017 CLINICAL DATA:  Left-sided chest pain and swelling, recent biopsy on right supraclavicular lymph nodes EXAM: CT CHEST WITH CONTRAST TECHNIQUE: Multidetector CT imaging of the chest was performed during intravenous contrast administration. CONTRAST:  28mL ISOVUE-300 IOPAMIDOL (ISOVUE-300) INJECTION 61% COMPARISON:  11/24/2017 FINDINGS: Cardiovascular: Atherosclerotic changes of the thoracic aorta are noted without aneurysmal dilatation or dissection. Coronary calcifications are seen. No pulmonary emboli are noted. Some attenuation of the pulmonary arterial branches is noted secondary to hilar adenopathy. Additionally some displacement of the vasculature in the neck is noted particularly on the right also related to adenopathy. Mediastinum/Nodes: Thoracic inlet demonstrates evidence of significant right supraclavicular lymphadenopathy. The largest nodal mass measures  approximately 10 by 4.9 cm and causes displacement of the adjacent vascular structures in the right neck. Additional adenopathy is noted in the anterior neck similar to that seen on prior examination. Significant mediastinal and hilar adenopathy is identified. The largest of these in the right hilum measures 2.3 cm in short axis. Subcarinal lymphadenopathy is noted measuring 16 mm in short axis. Prevascular adenopathy  is noted measuring 2.2 cm in short axis. The esophagus is within normal limits. Considerable adenopathy is noted in the posterior mediastinum surrounding the descending aorta. The nodal mass measures approximately 2.9 by 3.9 cm. Considerable right axillary adenopathy is noted extending along the lateral chest wall. Lungs/Pleura: Emphysematous changes are noted in the lungs bilaterally. Mild scarring is seen. No focal parenchymal mass is noted. No sizable effusion is seen. Upper Abdomen: Gallbladder has been surgically removed. The liver is within normal limits. The adrenal glands and visualized spleen are unremarkable. Musculoskeletal: T9 compression deformity is noted. Some lucency is noted within. It would be difficult to exclude some metastatic involvement given the findings throughout the chest. Degenerative changes of the thoracic spine are seen. No rib abnormality is noted. Considerable soft tissue mass is noted in the left anterior chest wall involving the pectoral muscles and subpectoral region. The overall size of the mass measures approximately 10.5 by 6.7 cm and Ing gallstone the subclavian and axillary artery is and veins. Significant skin thickening and edema is noted within the left breast. It would be difficult to exclude a portion of this representing a breast mass. There is apparent involvement of the chest wall although no bony destruction is seen. IMPRESSION: Diffuse lymphadenopathy involving the supraclavicular regions, mediastinum, bilateral hila as well as the axillary regions bilaterally as described above. Correlation with the recent biopsy results is recommended. It would be difficult to exclude a portion of the abnormality in the left chest wall to be related to the overlying breast given the degree of skin thickening and edema identified inferiorly. Alternatively this may represent an aggressive lymphoma. Further workup with PET-CT is recommended. T9 compression deformity with some  lucencies noted within. This may be strictly related to the compression deformity although the possibility of metastatic disease would deserve consideration as well. No other definitive bony abnormality is noted. Aortic Atherosclerosis (ICD10-I70.0) and Emphysema (ICD10-J43.9). Electronically Signed   By: Inez Catalina M.D.   On: 11/29/2017 20:01   Mr Neck Soft Tissue Only W Wo Contrast  Result Date: 11/30/2017 CLINICAL DATA:  Rapidly progressive lymphadenopathy, RIGHT arm swelling since Thanksgiving. Suspected lymphoma. Status post biopsy, results pending. EXAM: MRI OF THE NECK WITH CONTRAST TECHNIQUE: Multiplanar, multisequence MR imaging was performed following the administration of intravenous contrast. CONTRAST:  80mL MULTIHANCE GADOBENATE DIMEGLUMINE 529 MG/ML IV SOLN COMPARISON:  CT neck November 29, 2017 and CT neck November 24, 2017 FINDINGS: Moderately motion degraded examination. PHARYNX AND LARYNX: Normal.  Widely patent airway. SALIVARY GLANDS: Intraparotid lymph nodes bilaterally measuring to 17 mm in deep lobe. No definite primary parotid mass though limited by motion. THYROID: Normal. LYMPH NODES: Severe RIGHT greater than LEFT cervical lymphadenopathy with intermediate to bright T2 signal and, mild enhancement. Lymphadenopathy all levels of the neck, at least 8.4 x 4.2 cm RIGHT supraclavicular nodal conglomeration. Lymphadenopathy abuts the scalene muscles impinges upon the RIGHT brachials plexus. Limited assessment of the brachium plexus due to motion. Massive LEFT retropectoral lymphadenopathy, lesser extent on the RIGHT. VASCULAR: Normal flow-voids. LIMITED INTRACRANIAL: Normal. VISUALIZED ORBITS: Normal. MASTOIDS AND VISUALIZED PARANASAL SINUSES: Well-aerated. SKELETON: No  abnormal bone marrow signal though not tailored for evaluation. Limited assessment of spinal cord due to motion, no suspicious intracanalicular enhancement. UPPER CHEST: Lung apices are clear. No superior mediastinal  lymphadenopathy. OTHER: Susceptibility artifact LEFT neck corresponding to use surgical clips. IMPRESSION: 1. Moderately motion degraded examination. 2. Bulky cervical and included chest lymphadenopathy with RIGHT brachial plexus impingement. Electronically Signed   By: Elon Alas M.D.   On: 11/30/2017 22:16   Ir US Guide Vasc Access Right  Result Date: 12/17/2017 CLINICAL DATA:  Lymphoma. Needs durable venous access for planned chemotherapy regimen. EXAM: TUNNELED PORT CATHETER PLACEMENT WITH ULTRASOUND AND FLUOROSCOPIC GUIDANCE FLUOROSCOPY TIME:  0.1 minutes  ; 12  uGym2 DAP ANESTHESIA/SEDATION: Intravenous Fentanyl and Versed were administered as conscious sedation during continuous monitoring of the patient's level of consciousness and physiological / cardiorespiratory status by the radiology RN, with a total moderate sedation time of 15 minutes. TECHNIQUE: The procedure, risks, benefits, and alternatives were explained to the patient. Questions regarding the procedure were encouraged and answered. The patient understands and consents to the procedure. As antibiotic prophylaxis, cefazolin 2 g was ordered pre-procedure and administered intravenously within one hour of incision. External compression of the right IJ vein secondary to adenopathy was noted, corresponding to findings from recent CT chest. Patency of the right external jugular vein was identified. Patency of the right EJ vein was confirmed with ultrasound with image documentation. An appropriate skin site was determined. Skin site was marked. Region was prepped using maximum barrier technique including cap and mask, sterile gown, sterile gloves, large sterile sheet, and Chlorhexidine as cutaneous antisepsis. The region was infiltrated locally with 1% lidocaine. Under real-time ultrasound guidance, the right EJ vein was accessed with a 21 gauge micropuncture needle; the needle tip within the vein was confirmed with ultrasound image  documentation. Needle was exchanged over a 018 guidewire for transitional dilator which allowed passage of the [facility] wire into the IVC. Over this, the transitional dilator was exchanged for a 5 [place] MPA catheter. A small incision was made on the right anterior chest wall and a subcutaneous pocket fashioned. The power-injectable port was positioned and its catheter tunneled to the right EJ dermatotomy site. The MPA catheter was exchanged over an Amplatz wire for a peel-away sheath, through which the port catheter, which had been trimmed to the appropriate length, was advanced and positioned under fluoroscopy with its tip at the cavoatrial junction. Spot chest radiograph confirms good catheter position and no pneumothorax. The pocket was closed with deep interrupted and subcuticular continuous 3-0 Monocryl sutures. The port was flushed per protocol. The incisions were covered with Dermabond then covered with a sterile dressing. COMPLICATIONS: COMPLICATIONS None immediate IMPRESSION: Technically successful right EJ power-injectable port catheter placement. Ready for routine use. Electronically Signed   By: Lucrezia Europe M.D.   On: 12/17/2017 14:29   Dg Chest Port 1 View  Result Date: 12/11/2017 CLINICAL DATA:  Shortness of breath. EXAM: PORTABLE CHEST 1 VIEW COMPARISON:  11/29/2017 CT and chest radiograph. FINDINGS: Upper limits normal heart size and mediastinal/hilar adenopathy again noted. This is a low volume film with mild bibasilar atelectasis. Equivocal slight increased streaky opacities within the right upper lobe noted. There is no evidence of pneumothorax or definite pleural effusion. IMPRESSION: Equivocal right upper lobe opacities which could represent early infection, focal edema or possibly aspiration. Radiographic follow-up recommended. Low volume film with mild bibasilar atelectasis. Unchanged mediastinal/hilar adenopathy again noted. Electronically Signed   By: Cleatis Polka.D.  On: 12/11/2017  17:48   Dg Chest Portable 1 View  Result Date: 11/29/2017 CLINICAL DATA:  Abnormal lymphadenopathy in the neck, chest swelling, initial encounter EXAM: PORTABLE CHEST 1 VIEW COMPARISON:  11/24/2017 FINDINGS: Cardiac shadow is within normal limits. Soft tissue mass lesion is again noted superimposed over the left hilum similar to that seen on recent CT of the neck. Fullness in the hilar regions is noted bilaterally likely representing some adenopathy. Aortic calcifications are seen. The lungs are clear. No bony abnormality is noted. IMPRESSION: Changes consistent with the known left upper lobe mass lesion as well as apparent hilar adenopathy particularly on the right. CT of the chest is recommended for further evaluation as previously described. Electronically Signed   By: Inez Catalina M.D.   On: 11/29/2017 19:10   [place] Core Biopsy (lymph Nodes)  Result Date: 12/02/2017 INDICATION: 82 year old female with presumed lymphoma. She presents for biopsy of nodal mass of the left axilla. EXAM: ULTRASOUND-GUIDED LYMPH NODE BIOPSY MEDICATIONS: None. ANESTHESIA/SEDATION: Moderate (conscious) sedation was employed during this procedure. A total of Versed 2.0 mg and Fentanyl 50 mcg was administered intravenously. Moderate Sedation Time: 10 minutes. The patient's level of consciousness and vital signs were monitored continuously by radiology nursing throughout the procedure under my direct supervision. FLUOROSCOPY TIME:  None COMPLICATIONS: None PROCEDURE: Informed written consent was obtained from the patient after a thorough discussion of the procedural risks, benefits and alternatives. All questions were addressed. Maximal Sterile Barrier Technique was utilized including caps, mask, sterile gowns, sterile gloves, sterile drape, hand hygiene and skin antiseptic. A timeout was performed prior to the initiation of the procedure. Patient positioned supine position on the ultrasound stretcher. Ultrasound images of the left  axillary region were performed with images stored and sent to PACs. The patient is prepped and draped in the usual sterile fashion. The skin and subcutaneous tissues were generously infiltrated 1% lidocaine for local anesthesia. Using ultrasound guidance, at least 8 separate 18 gauge core biopsy of the left axillary node were performed. Specimen placed in the saline. Final image was stored. Patient tolerated the procedure well and remained hemodynamically stable throughout. No complications were encountered and no significant blood loss. IMPRESSION: Status post ultrasound-guided biopsy of left axillary mass. Tissue specimen sent to pathology for complete histopathologic analysis. Signed, Dulcy Fanny. Earleen Newport, DO [facility] Electronically Signed   By: Corrie Mckusick D.O.   On: 12/02/2017 15:51   [place] Core Biopsy (lymph Nodes)  Result Date: 11/28/2017 INDICATION: 83 year old female with a clinical history of rapidly progressive right submental, cervical and supraclavicular lymphadenopathy. She presents today for urgent ultrasound-guided core biopsy. EXAM: ULTRASOUND GUIDED core needle BIOPSY OF right supraclavicular lymph node MEDICATIONS: None. ANESTHESIA/SEDATION: Fentanyl 100 mcg IV; Versed 2 mg IV Moderate Sedation Time:  15 minutes The patient was continuously monitored during the procedure by the interventional radiology nurse under my direct supervision. PROCEDURE: The procedure, risks, benefits, and alternatives were explained to the patient. Questions regarding the procedure were encouraged and answered. The patient understands and consents to the procedure. The right submental region, the right cervical chain in the right supraclavicular lymph nodes or all interrogated with ultrasound. There are numerous enlarged, hypoechoic an abnormal appearing lymph nodes in each station. The most solid appearing lymph nodes are present in the right supraclavicular  station. The right submental lymph node is slightly more cystic and may not yield viable cells. Therefore, the decision was made to proceed with biopsy of the right  supraclavicular station lymph nodes. The right supraclavicular region was prepped with chlorhexidine in a sterile fashion, and a sterile drape was applied covering the operative field. A sterile gown and sterile gloves were used for the procedure. Local anesthesia was provided with 1% Lidocaine. A small dermatotomy was made. Under real-time sonographic guidance, numerous 18 gauge core biopsies were obtained of several lymph nodes in the right supraclavicular station. Biopsy specimens were placed in saline and delivered to pathology for further analysis. Post biopsy imaging demonstrates no evidence of hematoma or active bleeding. The patient tolerated the procedure well. There was no significant bleeding. COMPLICATIONS: None immediate. FINDINGS: Extensive hypoechoic lymphadenopathy throughout the right neck and right submental region. IMPRESSION: Technically successful ultrasound-guided core biopsy of right supraclavicular lymph nodes. Electronically Signed   By: Jacqulynn Cadet M.D.   On: 11/28/2017 09:04   Ir Fluoro Guide Port Insertion Right  Result Date: 12/17/2017 CLINICAL DATA:  Lymphoma. Needs durable venous access for planned chemotherapy regimen. EXAM: TUNNELED PORT CATHETER PLACEMENT WITH ULTRASOUND AND FLUOROSCOPIC GUIDANCE FLUOROSCOPY TIME:  0.1 minutes  ; 12  uGym2 DAP ANESTHESIA/SEDATION: Intravenous Fentanyl and Versed were administered as conscious sedation during continuous monitoring of the patient's level of consciousness and physiological / cardiorespiratory status by the radiology RN, with a total moderate sedation time of 15 minutes. TECHNIQUE: The procedure, risks, benefits, and alternatives were explained to the patient. Questions regarding the procedure were encouraged and answered. The patient understands and consents to  the procedure. As antibiotic prophylaxis, cefazolin 2 g was ordered pre-procedure and administered intravenously within one hour of incision. External compression of the right IJ vein secondary to adenopathy was noted, corresponding to findings from recent CT chest. Patency of the right external jugular vein was identified. Patency of the right EJ vein was confirmed with ultrasound with image documentation. An appropriate skin site was determined. Skin site was marked. Region was prepped using maximum barrier technique including cap and mask, sterile gown, sterile gloves, large sterile sheet, and Chlorhexidine as cutaneous antisepsis. The region was infiltrated locally with 1% lidocaine. Under real-time ultrasound guidance, the right EJ vein was accessed with a 21 gauge micropuncture needle; the needle tip within the vein was confirmed with ultrasound image documentation. Needle was exchanged over a 018 guidewire for transitional dilator which allowed passage of the [facility] wire into the IVC. Over this, the transitional dilator was exchanged for a 5 [place] MPA catheter. A small incision was made on the right anterior chest wall and a subcutaneous pocket fashioned. The power-injectable port was positioned and its catheter tunneled to the right EJ dermatotomy site. The MPA catheter was exchanged over an Amplatz wire for a peel-away sheath, through which the port catheter, which had been trimmed to the appropriate length, was advanced and positioned under fluoroscopy with its tip at the cavoatrial junction. Spot chest radiograph confirms good catheter position and no pneumothorax. The pocket was closed with deep interrupted and subcuticular continuous 3-0 Monocryl sutures. The port was flushed per protocol. The incisions were covered with Dermabond then covered with a sterile dressing. COMPLICATIONS: COMPLICATIONS None immediate IMPRESSION: Technically successful right EJ power-injectable port catheter placement. Ready  for routine use. Electronically Signed   By: Lucrezia Europe M.D.   On: 12/17/2017 14:29    Assessment: 82 y.o. [place] woman with a diffuse large cell B-cell non-Hodgkin's lymphoma, high-grade, diagnosed by lymph node biopsy December 2018  (1) prednisone started 12/03/2017, continued for 5 days  (a) repeat 12/19/2017  (2) rituximab weekly started  12/04/2017  (a) 2d dose 12/11/2017 w/o event  (b) 3d dose due 12/18/2017--deferred due to patient's poor functional status  (3) to start CHOP with infusional doxorubicin via port  12/31/2017  (4) no evidence of tumor lysis-- continue allopurinol, monitor labs (written)  (5) hypercalcemia--related to NHL; PTH and PTH related peptide not elevated  (a) pamidronate given 12/10/2017     Plan: Babette continues very weak; her functional status remains ECOG 3. Nursing tells me she felt even weaker after her last dose of rituximab a week ago. The patient and I discussed the overall situation and her short-term and longer term goals.  From a cancer oint of view we are essentially treading water until she is stronger and out f the hospital--target date for her first CHOP-R treatment is 01/09. She has had rituximab x 2, prednisone x 1, with fair tolerance, some evidence of response.  At this point I am going to hold off on rituximab and reassess next week. In the meantime she will have a second round of prednisone. This should help her fell better and do more in addition to controlling the NHL. I will see her again next week.  Please let me know if I can be of further help at this point.      Bobetta Lime, MD 12/19/2017  7:34 AM Medical Oncology and Hematology [facility] [address] [place], [place] [address] Tel. [phone]    Fax. [phone]

## 2017-12-19 NOTE — Plan of Care (Signed)
  Progressing RH BOWEL ELIMINATION RH STG MANAGE BOWEL WITH ASSISTANCE Description STG Manage Bowel with Total Assistance.   12/19/2017 0949 - Progressing by Brita Romp, RN 12/19/2017 [phone] - Progressing by Brita Romp, RN RH BLADDER ELIMINATION RH STG MANAGE BLADDER WITH ASSISTANCE Description STG Manage Bladder With Total Assistance   12/19/2017 0949 - Progressing by Brita Romp, RN 12/19/2017 [phone] - Progressing by Brita Romp, RN RH SAFETY RH STG ADHERE TO SAFETY PRECAUTIONS W/ASSISTANCE/DEVICE Description STG Adhere to Safety Precautions With  Mod I Assistance/Device.  12/19/2017 0949 - Progressing by Brita Romp, RN RH PAIN MANAGEMENT RH STG PAIN MANAGED AT OR BELOW PT'S PAIN GOAL Description Less than 4  12/19/2017 0949 - Progressing by Brita Romp, RN RH KNOWLEDGE DEFICIT GENERAL RH STG INCREASE [place] AFTER HOSPITALIZATION 12/19/2017 0949 - Progressing by Brita Romp, RN

## 2017-12-19 NOTE — Progress Notes (Signed)
Physical Therapy Weekly Progress Note  Patient Details  Name: Colleen [name] MRN: [id number] Date of Birth: 12/15/1935  Beginning of progress report period: December 11, 2017 End of progress report period: December 19, 2017  Today's Date: 12/19/2017 PT Individual Time : 1435-1550 AND 1745-1800 PT Individual Time Calculation (min): 75 min AND 15 min   Patient has met 0 of 3 short term goals. Pt continues to require Max-total assist +2 to stand, especially from lower surfaces including the w/c and recliner. She requires total assist to transfer bed<>chair using the stedy because of this. Gait remains not appropriate at this time 2/2 decreased tolerance to upright activity, poor postural control w/o back support, and decreased LE strength. Pt's therapy frequency was decreased to 15/7 on 12/17/17 2/2 fatigue and decreased tolerance to OOB/upright activity. Focus will remain on decreasing burden of care in anticipation of SNF placement at discharge and increasing pt's functional strength.  Patient continues to demonstrate the following deficits muscle weakness and muscle joint tightness, decreased cardiorespiratoy endurance and decreased oxygen support and decreased sitting balance, decreased standing balance, decreased postural control, decreased balance strategies and difficulty maintaining precautions and therefore will continue to benefit from skilled PT intervention to increase functional independence with mobility.  Patient not progressing toward long term goals.  See goal revision..  Plan of care revisions: LTGs downgraded to Mod-max assist overall due to lack of progress..  PT Short Term Goals Week 1:  PT Short Term Goal 1 (Week 1): Pt will perform sit to stand with least restrictive device from w/c with cushion with max A x 1 person. PT Short Term Goal 1 - Progress (Week 1): Not met PT Short Term Goal 2 (Week 1): Pt will transfer bed to chair with Max A x 1 person and least restictive  device. PT Short Term Goal 2 - Progress (Week 1): Not met PT Short Term Goal 3 (Week 1): Pt will ambulate 20 ft with RW and min A x 1. PT Short Term Goal 3 - Progress (Week 1): Not met Week 2:  PT Short Term Goal 1 (Week 2): Pt will transfer sit<>stand w/ max assist x1 using LRAD. PT Short Term Goal 2 (Week 2): Pt will tolerate sitting up in recliner or w/c in between all therapies during duration of 1 day.  PT Short Term Goal 3 (Week 2): Pt will perform sit<>supine transfer w/ mod assist.  PT Short Term Goal 4 (Week 2): Pt will maintain static sitting balance w/o back support for 2 min w/ max assist.   Skilled Therapeutic Interventions/Progress Updates:   Session 1:  Pt in recliner upon arrival and agreeable to therapy, c/o soreness in RUE but tolerable. Transferred to w/c via stedy, Max-total assist +2 to stand in stedy. Verbal and tactile cues to maintain upright posture in standing on stedy, unable to maintain upright posture w/ max assist more than 3-4 sec before leaning on stedy w/ LUE. Total assist to reposition in w/c to neutral trunk positioning. Total assist w/c transport to/from gym for time management and worked on BLE strengthening exercises as listed below. Frequent rest breaks 2/2 increased work of breathing and fatigue w/ activity. Assisted pt w/ drinking during rest breaks, set-up assist only w/ taking sips of water today using LUE, improved from yesterday. Discussed performing LE strengthening exercises while sitting up in recliner or w/c when pt feels she has enough energy to perform w/ husband or other family member present, both in agreement. Returned to room in   w/c and pt in agreement to sit up in w/c until next session to increase tolerance to upright and OOB activity. All needs met and in care of husband.  BLE strengthening exercises performed in w/c: -kinetron @ lowest resistance level, 2 min x4 -LAQs 2x10 -heel raises 3x10 -toe raises 3x10  -knee flexion slides x10 -hip  adduction squeezes2 x10 -hip abduction resisted 2x10  Session 2:  Pt sitting up in w/c upon arrival and agreeable to therapy, no c/o pain. Assisted pt w/ transfer back to supine 2/2 fatigue at end of day. Performed sit>stand transfer in stedy, max assist x2. Pt able to maintain upright positioning w/ LUE support in stedy w/ supervision this session, much improved from morning session. Verbal cues for posture. Transferred w/c to EOB via stedy and sit>sidelying w/ max assist, verbal and manual cues for technique and weight-shifting. Pt performed multiple R and L rolls while PT provided total assist for bed pan placement as pt was requesting to toilet. Verbal and tactile cues for initiation and technique w/ rolling in supine. Total assist to position in supine for pt's comfort. Continued to provide education throughout session regarding position techniques both in w/c and supine using pillows and towel rolls for postural support. Ended session on bed pan in supine, RN made aware. Call bell within reach and all needs met.   Therapy Documentation Precautions:  Precautions Precautions: Fall, Other (comment) Precaution Comments: Chemo; BP only in LE; incontinent with mobility; R UE pain/lymphedema Restrictions Weight Bearing Restrictions: No Vital Signs: Therapy Vitals Pulse Rate: 60 Resp: 16 BP: (!) 128/55 Patient Position (if appropriate): Sitting Oxygen Therapy SpO2: 94 %  See Function Navigator for Current Functional Status.  Therapy/Group: Individual Therapy  Hank Walling K Arnette 12/19/2017, 6:02 PM

## 2017-12-19 NOTE — Plan of Care (Signed)
Foley Catheter placed 12/19/17 for urinary retention Pt denies any pain at present Gerhardts cream to buttocks

## 2017-12-19 NOTE — Progress Notes (Signed)
[place] PHYSICAL MEDICINE & REHABILITATION     PROGRESS NOTE  Subjective/Complaints:  Pt seen laying in bed this Am.  She slept well overnight, but is tired this Am.  She notes slight improvement in right hand.   ROS: Denies CP, nausea, vomiting, diarrhea.  Objective: Vital Signs: Blood pressure (!) 113/47, pulse 67, temperature 99 F (37.2 C), temperature source Oral, resp. rate 16, height 5\' 4"  (1.626 m), weight 90.7 kg (199 lb 15.3 oz), SpO2 99 %. Ir US Guide Vasc Access Right  Result Date: 12/17/2017 CLINICAL DATA:  Lymphoma. Needs durable venous access for planned chemotherapy regimen. EXAM: TUNNELED PORT CATHETER PLACEMENT WITH ULTRASOUND AND FLUOROSCOPIC GUIDANCE FLUOROSCOPY TIME:  0.1 minutes  ; 12  uGym2 DAP ANESTHESIA/SEDATION: Intravenous Fentanyl and Versed were administered as conscious sedation during continuous monitoring of the patient's level of consciousness and physiological / cardiorespiratory status by the radiology RN, with a total moderate sedation time of 15 minutes. TECHNIQUE: The procedure, risks, benefits, and alternatives were explained to the patient. Questions regarding the procedure were encouraged and answered. The patient understands and consents to the procedure. As antibiotic prophylaxis, cefazolin 2 g was ordered pre-procedure and administered intravenously within one hour of incision. External compression of the right IJ vein secondary to adenopathy was noted, corresponding to findings from recent CT chest. Patency of the right external jugular vein was identified. Patency of the right EJ vein was confirmed with ultrasound with image documentation. An appropriate skin site was determined. Skin site was marked. Region was prepped using maximum barrier technique including cap and mask, sterile gown, sterile gloves, large sterile sheet, and Chlorhexidine as cutaneous antisepsis. The region was infiltrated locally with 1% lidocaine. Under real-time ultrasound  guidance, the right EJ vein was accessed with a 21 gauge micropuncture needle; the needle tip within the vein was confirmed with ultrasound image documentation. Needle was exchanged over a 018 guidewire for transitional dilator which allowed passage of the [facility] wire into the IVC. Over this, the transitional dilator was exchanged for a 5 [place] MPA catheter. A small incision was made on the right anterior chest wall and a subcutaneous pocket fashioned. The power-injectable port was positioned and its catheter tunneled to the right EJ dermatotomy site. The MPA catheter was exchanged over an Amplatz wire for a peel-away sheath, through which the port catheter, which had been trimmed to the appropriate length, was advanced and positioned under fluoroscopy with its tip at the cavoatrial junction. Spot chest radiograph confirms good catheter position and no pneumothorax. The pocket was closed with deep interrupted and subcuticular continuous 3-0 Monocryl sutures. The port was flushed per protocol. The incisions were covered with Dermabond then covered with a sterile dressing. COMPLICATIONS: COMPLICATIONS None immediate IMPRESSION: Technically successful right EJ power-injectable port catheter placement. Ready for routine use. Electronically Signed   By: Lucrezia Europe M.D.   On: 12/17/2017 14:29   Ir Fluoro Guide Port Insertion Right  Result Date: 12/17/2017 CLINICAL DATA:  Lymphoma. Needs durable venous access for planned chemotherapy regimen. EXAM: TUNNELED PORT CATHETER PLACEMENT WITH ULTRASOUND AND FLUOROSCOPIC GUIDANCE FLUOROSCOPY TIME:  0.1 minutes  ; 12  uGym2 DAP ANESTHESIA/SEDATION: Intravenous Fentanyl and Versed were administered as conscious sedation during continuous monitoring of the patient's level of consciousness and physiological / cardiorespiratory status by the radiology RN, with a total moderate sedation time of 15 minutes. TECHNIQUE: The procedure, risks, benefits, and alternatives were explained  to the patient. Questions regarding the procedure were encouraged and answered. The patient  understands and consents to the procedure. As antibiotic prophylaxis, cefazolin 2 g was ordered pre-procedure and administered intravenously within one hour of incision. External compression of the right IJ vein secondary to adenopathy was noted, corresponding to findings from recent CT chest. Patency of the right external jugular vein was identified. Patency of the right EJ vein was confirmed with ultrasound with image documentation. An appropriate skin site was determined. Skin site was marked. Region was prepped using maximum barrier technique including cap and mask, sterile gown, sterile gloves, large sterile sheet, and Chlorhexidine as cutaneous antisepsis. The region was infiltrated locally with 1% lidocaine. Under real-time ultrasound guidance, the right EJ vein was accessed with a 21 gauge micropuncture needle; the needle tip within the vein was confirmed with ultrasound image documentation. Needle was exchanged over a 018 guidewire for transitional dilator which allowed passage of the [facility] wire into the IVC. Over this, the transitional dilator was exchanged for a 5 [place] MPA catheter. A small incision was made on the right anterior chest wall and a subcutaneous pocket fashioned. The power-injectable port was positioned and its catheter tunneled to the right EJ dermatotomy site. The MPA catheter was exchanged over an Amplatz wire for a peel-away sheath, through which the port catheter, which had been trimmed to the appropriate length, was advanced and positioned under fluoroscopy with its tip at the cavoatrial junction. Spot chest radiograph confirms good catheter position and no pneumothorax. The pocket was closed with deep interrupted and subcuticular continuous 3-0 Monocryl sutures. The port was flushed per protocol. The incisions were covered with Dermabond then covered with a sterile dressing. COMPLICATIONS:  COMPLICATIONS None immediate IMPRESSION: Technically successful right EJ power-injectable port catheter placement. Ready for routine use. Electronically Signed   By: Lucrezia Europe M.D.   On: 12/17/2017 14:29   Recent Labs    12/18/17 0538 12/19/17 0414  WBC 6.2  7.8 7.3  HGB 11.8*  9.5* 9.6*  HCT 37.1  29.6* 30.2*  PLT 146*  178 196   Recent Labs    12/17/17 1138 12/18/17 0538  NA 138 138  K 4.6 4.7  CL 105 101  GLUCOSE 78 65  BUN 58* 60*  CREATININE 1.58* 1.56*  CALCIUM 10.5* 10.8*   CBG (last 3)  No results for input(s): GLUCAP in the last 72 hours.  Wt Readings from Last 3 Encounters:  12/19/17 90.7 kg (199 lb 15.3 oz)  12/08/17 88 kg (194 lb 0.1 oz)  11/28/17 73 kg (161 lb)    Physical Exam:  BP (!) 113/47 (BP Location: Right Leg)   Pulse 67   Temp 99 F (37.2 C) (Oral)   Resp 16   Ht 5\' 4"  (1.626 m)   Wt 90.7 kg (199 lb 15.3 oz)   SpO2 99%   BMI 34.32 kg/m  Constitutional: NAD. Obese  HENT: Normocephalic. Atraumatic Eyes: EOM are normal. No discharge.  Cardiovascular: RRR. No JVD.  Respiratory: No respiratory distress. Clear to auscultation  GI: Bowel sounds are normal. She exhibits no distension.  Musculoskeletal: +Edema in all extremities  Neurological: She isalertand oriented. Motor: RUE: Shoulder abduction 1/5, elbow flex/extension 2-/5, hand grip 3+/5 (slightly improved) LUE: 4/5 proximal to distal B/L LE: HF 4/5, KE 4/5, ADF/PF 4+/5.  Skin. Warm and dry Psychiatric: She has a normal mood and affect. Her behavior is normal. Judgment and thought content normal.   Assessment/Plan: 1. Functional deficits secondary to diffuse large B-cell non-Hodgkin's lymphoma which require 3+ hours per day of interdisciplinary therapy  in a comprehensive inpatient rehab setting. Physiatrist is providing close team supervision and 24 hour management of active medical problems listed below. Physiatrist and rehab team continue to assess barriers to discharge/monitor  patient progress toward functional and medical goals.  Function:  Bathing Bathing position   Position: Other (comment)(bedside chair)  Bathing parts Body parts bathed by patient: Chest, Abdomen, Right upper leg, Left upper leg Body parts bathed by helper: Left arm, Right lower leg, Left lower leg, Back, Right arm, Front perineal area, Buttocks, Chest, Abdomen, Right upper leg, Left upper leg  Bathing assist Assist Level: 2 helpers   Set up : To open containers, To obtain items  Upper Body Dressing/Undressing Upper body dressing   What is the patient wearing?: Pull over shirt/dress       Pull over shirt/dress - Perfomed by helper: Thread/unthread right sleeve, Thread/unthread left sleeve, Put head through opening, Pull shirt over trunk        Upper body assist Assist Level: Touching or steadying assistance(Pt > 75%)      Lower Body Dressing/Undressing Lower body dressing   What is the patient wearing?: Ted Hose, Non-skid slipper socks           Non-skid slipper socks- Performed by helper: Don/doff right sock, Don/doff left sock               TED Hose - Performed by helper: Don/doff left TED hose, Don/doff right TED hose  Lower body assist        Toileting Toileting Toileting activity did not occur: No continent bowel/bladder event   Toileting steps completed by helper: Adjust clothing prior to toileting, Performs perineal hygiene, Adjust clothing after toileting    Toileting assist Assist level: Two helpers   Transfers Chair/bed transfer   Chair/bed transfer method: Other Chair/bed transfer assist level: 2 helpers Chair/bed transfer assistive device: Mechanical lift Mechanical lift: Ecologist     Max distance: 4 ft Assist level: Touching or steadying assistance (Pt > 75%)   Wheelchair   Type: Manual Max wheelchair distance: (10 ft in room) Assist Level: Moderate assistance (Pt 50 - 74%)  Cognition Comprehension Comprehension  assist level: Understands basic 75 - 89% of the time/ requires cueing 10 - 24% of the time  Expression Expression assist level: Expresses basic 90% of the time/requires cueing < 10% of the time.  Social Interaction Social Interaction assist level: Interacts appropriately 75 - 89% of the time - Needs redirection for appropriate language or to initiate interaction.  Problem Solving Problem solving assist level: Solves basic 50 - 74% of the time/requires cueing 25 - 49% of the time  Memory Memory assist level: Recognizes or recalls 75 - 89% of the time/requires cueing 10 - 24% of the time     Medical Problem List and Plan: 1.  Decreased functional mobility secondary to diffuse large B-cell non-Hodgkin's lymphoma.   Continue CIR    Per Heme/Onc, notes reviewed, plan to hold Rituximab this week and proceeed with second round of steroids today.   Plan CHOP with infusional doxorubicin as an outpatient per Heme/Onc 2.  DVT Prophylaxis/Anticoagulation:  Monitor for any bleeding episodes   Coumadin restarted, INR subtherapeutic on 12/28 3. Pain Management with brachial plexus impingement due to lymphadenopathy             Neurontin 300 mg 3 times a day, oxycodone as needed 4. Mood: Provide emotional support 5. Neuropsych: This patient is capable of making decisions on her  own behalf. 6. Skin/Wound Care: Routine skin checks 7. Fluids/Electrolytes/Nutrition: Routine I&O's  8. Atrial fibrillation. Continue Tambocor 100 mg twice a day, Lopressor 12.5 mg twice a day   Controlled on 12/28 9. Hypertension. Monitor with increased mobility   Lisinopril 20 mg daily d/ced on 12/26   Relatively controlled on 12/28 10. Diastolic congestive heart failure. Monitor for any signs of fluid overload. Weigh patient daily [facility] Weights   12/17/17 0540 12/18/17 0244 12/19/17 0421  Weight: 89.2 kg (196 lb 10.4 oz) 96.7 kg (213 lb 3 oz) 90.7 kg (199 lb 15.3 oz)    ?Reliability   Last Echo 2016, reordered, completed,  relatively stable, with no significant changes 11. Hypothyroidism. Synthroid 12. Gout. Allopurinol 300 mg daily. Monitor for any gout flareups 13. Constipation. Laxative assistance 14. RUE lymphedema: see #3, continue elevation, massage, edema mgt 15. Hypoalbuminemia   Supplement initiated on 12/19 16. Acute blood loss anemia   Hemoglobin 9.6 on 12/28   Labs ordered for Monday   Continue to monitor 17. 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 Hyponatremia: Resolved   Cont to monitor 20. AKI with diffuse edema   Cr 1.56 on 12/27   Labs pending for today, ordered for Monday   Appreciate IM recs   Encourage fluids   No IM note yesterday, will cont to follow recs   Will need to be mindful of tumor lysis syndrome    Cont to monitor 21. Leukocytosis   UA equivocal, urine culture with multiple species   CXR reviewed, showing improvement   Cont to monitor 22. Thrombocytopenia:    Platelets 146 on 12/27   Labs ordered for Monday   Continue to monitor 23. Postinfusion fluid overload   Lasix 40 daily started on 12/22, increased dose to 60 for 12/27 24. Urinary retention with incontinence now   DCed Foley, voiding trial   PVRs variable this point 25. Leukocytosis: Resolved   UA+, Ucx no growth   Empiric keflex d/ced  LOS (Days) 10 A TO FACE EVALUATION WAS PERFORMED  Ecko Beasley Lorie Phenix 12/19/2017 9:19 AM

## 2017-12-19 NOTE — Progress Notes (Signed)
Occupational Therapy Session Note  Patient Details  Name: Colleen [name] MRN: [id number] Date of Birth: 17-Jul-1935  Today's Date: 12/19/2017 OT Individual Time: 1301-1402 OT Individual Time Calculation (min): 61 min    Short Term Goals: Week 2:  OT Short Term Goal 1 (Week 2): Pt will maintain static sitting during selfcare tasks with mod assist.  OT Short Term Goal 2 (Week 2): Pt will perform UB bathing with mod assist using AE PRN. OT Short Term Goal 3 (Week 2): Pt will perform stand pivot toilet transfer to the 3:1 with mod assist.  OT Short Term Goal 4 (Week 2): Pt will tolerate AAROM of the right shoulder 0-70 degrees for edema control and for greater use with selfcare tasks.    Skilled Therapeutic Interventions/Progress Updates:    Pt completed rolling in the bed with total assist and max demonstrational cueing.  Worked on peri cleaning and donning new brief in supine position.  Once completed had pt transition to sitting on the right side of the bed.  Total assist needed to transition from sidelying to sitting.  Once in sitting, she needed max assist to maintain sitting balance secondary to lean to the right and forward.  Pt sits in severe thoracic rounding with cervical flexion.  Worked on Theme park manager in sitting with total assist.  Next had pt transfer to the bedside recliner with total assist stand pivot.  Pt was positioned in the recliner with family present and call button and phone in reach.  Increased edema still noted in [place].    Therapy Documentation Precautions:  Precautions Precautions: Fall, Other (comment) Precaution Comments: Chemo; BP only in LE; incontinent with mobility; R UE pain/lymphedema Restrictions Weight Bearing Restrictions: No  Pain:  No report of pain  ADL: See Function Navigator for Current Functional Status.   Therapy/Group: Individual Therapy  Tayja Manzer OTR/L 12/19/2017, 5:30 PM

## 2017-12-19 NOTE — Progress Notes (Signed)
Occupational Therapy Note  Patient Details  Name: Colleen [name] MRN: [id number] Date of Birth: 1935/03/31  Today's Date: 12/19/2017 OT Individual Time: 1000-1028 OT Individual Time Calculation (min): 28 min   Pt c/o RUE pain with movement Individual Therapy - Makeup Session  Pt missed 32 mins of scheduled 60 min makeup session.  Pt resting in bed with eyes closed upon arrival.  Pt's cup with straw resting against pt's chin/mouth.  Pt responded with weak voice to therapist greeeting.  Attempted to engage pt in bed mobility with goal of transferring to bedside recliner.  Pt states she continues to feel weaker.  Pt remained in bed with all needs within reach and new cup of water provided.  Offered to assist pt with eating breakfast but pt refused.    Leotis Shames [facility] 12/19/2017, 10:30 AM

## 2017-12-20 ENCOUNTER — Inpatient Hospital Stay (HOSPITAL_COMMUNITY): Payer: Medicare Other | Admitting: Occupational Therapy

## 2017-12-20 ENCOUNTER — Inpatient Hospital Stay (HOSPITAL_COMMUNITY): Payer: Medicare Other | Admitting: Physical Therapy

## 2017-12-20 DIAGNOSIS — C8580 Other specified types of non-Hodgkin lymphoma, unspecified site: Secondary | ICD-10-CM

## 2017-12-20 LAB — CBC WITH DIFFERENTIAL/PLATELET
Basophils Absolute: 0 10*3/uL (ref 0.0–0.1)
Basophils Relative: 0 %
Eosinophils Absolute: 0 10*3/uL (ref 0.0–0.7)
Eosinophils Relative: 0 %
HCT: 27.4 % — ABNORMAL LOW (ref 36.0–46.0)
Hemoglobin: 8.9 g/dL — ABNORMAL LOW (ref 12.0–15.0)
Lymphocytes Relative: 9 %
Lymphs Abs: 0.4 10*3/uL — ABNORMAL LOW (ref 0.7–4.0)
MCH: 35.2 pg — ABNORMAL HIGH (ref 26.0–34.0)
MCHC: 32.5 g/dL (ref 30.0–36.0)
MCV: 108.3 fL — ABNORMAL HIGH (ref 78.0–100.0)
Monocytes Absolute: 0.4 10*3/uL (ref 0.1–1.0)
Monocytes Relative: 10 %
Neutro Abs: 3.1 10*3/uL (ref 1.7–7.7)
Neutrophils Relative %: 81 %
Platelets: 191 10*3/uL (ref 150–400)
RBC: 2.53 MIL/uL — ABNORMAL LOW (ref 3.87–5.11)
RDW: 16.5 % — ABNORMAL HIGH (ref 11.5–15.5)
WBC: 3.8 10*3/uL — ABNORMAL LOW (ref 4.0–10.5)

## 2017-12-20 LAB — COMPREHENSIVE METABOLIC PANEL
ALT: 17 U/L (ref 14–54)
AST: 43 U/L — ABNORMAL HIGH (ref 15–41)
Albumin: 2.1 g/dL — ABNORMAL LOW (ref 3.5–5.0)
Alkaline Phosphatase: 86 U/L (ref 38–126)
Anion gap: 11 (ref 5–15)
BUN: 69 mg/dL — ABNORMAL HIGH (ref 6–20)
CO2: 26 mmol/L (ref 22–32)
Calcium: 10.7 mg/dL — ABNORMAL HIGH (ref 8.9–10.3)
Chloride: 98 mmol/L — ABNORMAL LOW (ref 101–111)
Creatinine, Ser: 1.4 mg/dL — ABNORMAL HIGH (ref 0.44–1.00)
GFR calc Af Amer: 40 mL/min — ABNORMAL LOW (ref >60)
GFR calc non Af Amer: 34 mL/min — ABNORMAL LOW (ref >60)
Glucose, Bld: 98 mg/dL (ref 65–99)
Potassium: 4.9 mmol/L (ref 3.5–5.1)
Sodium: 135 mmol/L (ref 135–145)
Total Bilirubin: 0.7 mg/dL (ref 0.3–1.2)
Total Protein: 4.8 g/dL — ABNORMAL LOW (ref 6.5–8.1)

## 2017-12-20 LAB — PROTIME-INR
INR: 1.75
Prothrombin Time: 20.3 seconds — ABNORMAL HIGH (ref 11.4–15.2)

## 2017-12-20 LAB — URIC ACID: Uric Acid, Serum: 5.6 mg/dL (ref 2.3–6.6)

## 2017-12-20 LAB — LACTATE DEHYDROGENASE: LDH: 592 U/L — ABNORMAL HIGH (ref 98–192)

## 2017-12-20 MED ORDER — WARFARIN SODIUM 5 MG PO TABS
5.0000 mg | ORAL_TABLET | Freq: Once | ORAL | Status: AC
  Administered 2017-12-20: 5 mg via ORAL
  Filled 2017-12-20: qty 1

## 2017-12-20 NOTE — Progress Notes (Signed)
PROGRESS NOTE  Colleen [name]:[id number] DOB: January 06, 1935 DOA: 12/09/2017 PCP: Ann Held, DO  Brief Narrative: 82yow presented on 11/29/2017 planes of swelling and pain right arm as well as mass in her right neck and was found to have rapidly developing diffuse lymphadenopathy.  Diffuse large B cell non-Hodgkin's lymphoma was confirmed on lymph node biopsy on 12/02/17. Discharged to [facility] 12/18. Medicine consulted given AKI on BMP from 12/16/2017 with creatinine of 2  Assessment/Plan AKI. New since 12/25. ACE-I on hold. Urinary retention noted previously. - creatinine trending down s/p foley placement; BUN still high. Pattern suggests postrenal etiology. - modest hyperkalemia resolved - renal u/s with mild left hydronephrosis, question distal obstruction. U/a negative. Wonder NHL/LAD is etiology. If creatinine falls to improve would suggest urology evaluation. - continue foley, strict I/O - wonder if immunotherapy contributing.  BLE edema. Etiology unclear. Echo 11/2017 with normal LVEF. Grade 1 diastolic CHF - massive volume overload; will address once renal function stable.  Diffuse large B-cell NHL dx 11/2017, immunotherapy initiated 12/13 - per oncology. Monitor for TLS.  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 - per oncology  Afib - stable; continue flecainide, Lopressor, warfarin  Hospitalized 12/8-12/18 for diffuse large B-cell NHL, right arm lymphedema,    Murray Hodgkins, MD  [facility] Direct contact: [phone] --Via amion app OR  --[email or web address]; password [email or web address]  7PM-7AM contact night coverage as above 12/20/2017, 4:08 PM  LOS: 11 days    Procedures:  12.26 Right EJ port placement   Interval history/Subjective: Feels better today. Urinating frequently.  Objective: Vitals:  Vitals:   12/20/17 1011 12/20/17 1351  BP: (!) 108/39 (!) 115/43  Pulse:  (!) 56  Resp:  18  Temp:  97.7 F  (36.5 C)  SpO2:  97%    Exam:  Constitutional:   . Appears calm and comfortable Respiratory:  . CTA bilaterally, no w/r/r.  . Respiratory effort normal.  Cardiovascular:  . RRR, no m/r/g . 3+ LE extremity edema, 3+ RUE edema Psychiatric:  . Mental status o Mood, affect appropriate  I have personally reviewed the following:  Filed Weights   12/17/17 0540 12/18/17 0244 12/19/17 0421  Weight: 89.2 kg (196 lb 10.4 oz) 96.7 kg (213 lb 3 oz) 90.7 kg (199 lb 15.3 oz)   Weight change:   UOP: 1205 I/O since admission: inaccurate Last BM charted: 12/29  Labs:  BUN 60 > 70 > 69  Creatinine 1.56 >> 1.64 > 1.40  K+ 4.9  Hgb stable 8.9  Renal u/s noted  Scheduled Meds: . allopurinol  300 mg Oral Daily  . feeding supplement (PRO-STAT SUGAR FREE 64)  30 mL Oral BID  . flecainide  100 mg Oral BID  . gabapentin  300 mg Oral TID  . Gerhardt's butt cream   Topical BID  . levothyroxine  88 mcg Oral QAC breakfast  . metoprolol tartrate  12.5 mg Oral BID  . polyethylene glycol  17 g Oral Daily  . predniSONE  60 mg Oral Q breakfast  . senna-docusate  1 tablet Oral QHS  . valACYclovir  500 mg Oral Daily  . warfarin  5 mg Oral ONCE-1800  . Warfarin - Pharmacist Dosing Inpatient   Does not apply q1800   Continuous Infusions: . sodium chloride    . famotidine      Active Problems:   AKI (acute kidney injury) ([place])   Debility   Lymphedema   Hypoalbuminemia due to protein-calorie  malnutrition ([place])   Hypercalcemia   HSV (herpes simplex virus) anogenital infection   Leukocytosis   SOB (shortness of breath)   Hypervolemia   Urinary retention   Urinary incontinence   Peripheral edema   LOS: 11 days

## 2017-12-20 NOTE — Progress Notes (Signed)
Physical Therapy Session Note  Patient Details  Name: Colleen [name] MRN: [id number] Date of Birth: Feb 22, 1935  Today's Date: 12/20/2017 PT Individual Time: 1345-1500 PT Individual Time Calculation (min): 75 min   Short Term Goals: Week 2:  PT Short Term Goal 1 (Week 2): Pt will transfer sit<>stand w/ max assist x1 using LRAD. PT Short Term Goal 2 (Week 2): Pt will tolerate sitting up in recliner or w/c in between all therapies during duration of 1 day.  PT Short Term Goal 3 (Week 2): Pt will perform sit<>supine transfer w/ mod assist.  PT Short Term Goal 4 (Week 2): Pt will maintain static sitting balance w/o back support for 2 min w/ max assist.   Skilled Therapeutic Interventions/Progress Updates:   Pt supine upon arrival and agreeable to therapy, c/o RUE pain w/ movement, but tolerable and resolves w/ rest of extremity. Worked on tolerance to OOB and upright activity this session. Transferred to EOB w/ max assist and maintained static sitting balance w/ mod assist while setting up stedy for transfer. Pt performed sit>stand in stedy w/ elevated surface and max assist to boost. Transferred to w/c w/ stedy and required 2nd helper to transition to sit. Performed bathing while sitting up in w/c, pt able to perform ~20% of bathing using LUE at trunk, required total assist for LEs, LUE, and back. Max assist to don new gown. Pt required increased time for all functional mobility and bathing this session 2/2 increased work of breathing and fatigue. Verbal and visual cues for breathing and positioning of trunk for increased airflow. Total assist w/c transport to/from gym 2/2 time management and performed UE strength and ROM exercises w/ 1# dowel rod. Exercises included bilateral push/pull w/ and w/o resistance. Min assist to maintain RUE on dowel rod. Returned to room and switch pt's foot rests to elevating leg rests for assist w/ LE edema. Ended session sitting in w/c and in care of family, all needs  met.   Therapy Documentation Precautions:  Precautions Precautions: Fall, Other (comment) Precaution Comments: Chemo; BP only in LE; incontinent with mobility; R UE pain/lymphedema Restrictions Weight Bearing Restrictions: No Vital Signs: Therapy Vitals Temp: 97.7 F (36.5 C) Temp Source: Oral Pulse Rate: (!) 56 Resp: 18 BP: (!) 115/43 Patient Position (if appropriate): Lying Oxygen Therapy SpO2: 97 % O2 Device: Nasal Cannula O2 Flow Rate (L/min): 2 L/min  See Function Navigator for Current Functional Status.   Therapy/Group: Individual Therapy  Bradly Sangiovanni K Arnette 12/20/2017, 3:03 PM

## 2017-12-20 NOTE — Progress Notes (Signed)
Social Work Patient ID: Colleen [name], female   DOB: [birth year], 82 y.o.   MRN: [id number]   CSW met with pt, pt's husband, and pt's dtr Santiago Glad) to update them on team conference discussion and that pt will need hands on physical care.  Husband acknowledged that he will not be physically capable of providing this care and that pt will need to go to SNF for more rehab after CIR.  OT, Clyda Greener, was in the pt's room at the time of CSW visit and he clearly discussed the care pt would need and that he sometimes needs second person to help with pt and husband agreed again that he could not provide the care.  Family would like for CSW to pursue SNF placement for pt once MD has determined that pt is medically stable for d/c.  CSW provided husband with SNF list and he will review it.  CSW to talk with medical team and assist pt/family with SNF search according to medical team timeline.

## 2017-12-20 NOTE — Progress Notes (Signed)
Occupational Therapy Session Note  Patient Details  Name: Colleen [name] MRN: [id number] Date of Birth: [birth year]  Today's Date: 12/20/2017 OT Individual Time: 1497-0263 OT Individual Time Calculation (min): 70 min    Short Term Goals: Week 2:  OT Short Term Goal 1 (Week 2): Pt will maintain static sitting during selfcare tasks with mod assist.  OT Short Term Goal 2 (Week 2): Pt will perform UB bathing with mod assist using AE PRN. OT Short Term Goal 3 (Week 2): Pt will perform stand pivot toilet transfer to the 3:1 with mod assist.  OT Short Term Goal 4 (Week 2): Pt will tolerate AAROM of the right shoulder 0-70 degrees for edema control and for greater use with selfcare tasks.    Skilled Therapeutic Interventions/Progress Updates:    Upon entering the room, pt supine in bed with daughter present and RN. Pt agreeable to OT intervention but declines OOB activities. Skilled OT intervention with focus on AAROM for R UE shoulder and elbow in all planes. Pt engaged in modified PNF movements as well in supine and limited secondary to pain with ROM. Pt required encouragement for participation and verbal cues for pursed lip breathing. OT performed retrograde massage to R UE this session while education on energy conservation strategies and POC. Pt remaining supine with call bell and all needed items within reach upon exiting the room.   Therapy Documentation Precautions:  Precautions Precautions: Fall, Other (comment) Precaution Comments: Chemo; BP only in LE; incontinent with mobility; R UE pain/lymphedema Restrictions Weight Bearing Restrictions: No General:   Vital Signs: Therapy Vitals BP: (!) 108/39 Pain: Pain Assessment Pain Assessment: No/denies pain ADL:   Vision   Perception    Praxis   Exercises:   Other Treatments:    See Function Navigator for Current Functional Status.   Therapy/Group: Individual Therapy  Gypsy Decant 12/20/2017, 12:22 PM

## 2017-12-20 NOTE — Progress Notes (Signed)
[place] PHYSICAL MEDICINE & REHABILITATION     PROGRESS NOTE  Subjective/Complaints:  Patient is sitting up in bed this morning.  She is eating.  Her daughter is in the room with her.  Patient states that she feels better.  Patient's daughter states that her mother is doing better this morning.  Reviewed Dr. Virgie Dad note.  Reviewed Dr. Everett Graff note.  Objective: Vital Signs: Blood pressure (!) 133/53, pulse (!) 58, temperature 97.6 F (36.4 C), temperature source Oral, resp. rate 16, height 5\' 4"  (1.626 m), weight 199 lb 15.3 oz (90.7 kg), SpO2 98 %.  Elderly female in no acute distress.  She sitting up in bed eating. HEENT exam: Atraumatic, normocephalic extraocular muscles are intact. Neck is supple Chest is clear to auscultation without any increased work of breathing.  Cardiac exam S1 and S2 are regular.  Abdominal exam overweight, active bowel sounds, soft.  Extremities there is diffuse edema in the lower and upper extremities.  Right upper extremity has more edema than the left upper extremity.  Lower extremity edema extends to the mid thighs posteriorly. Neurologic exam: She is alert.  Assessment/Plan: 1. Functional deficits secondary to diffuse large B-cell non-Hodgkin's lymphoma    Medical Problem List and Plan: 1.     Diffuse large B cell non-Hodgkin's lymphoma contributing to decreased functional status.  Continue inpatient rehab.  Rituximab was held.  CHOP is planned as an outpatient.   2.  DVT Prophylaxis/Anticoagulation:  Lab Results  Component Value Date   INR 1.75 12/20/2017   INR 1.49 12/19/2017   INR 1.39 12/18/2017  Warfarin managed per pharmacy.   3. Pain Management with brachial plexus impingement due to lymphadenopathy             Better controlled today. 4. Mood: Provide emotional support 5. Neuropsych: This patient is capable of making decisions on her own behalf. 6. Skin/Wound Care: Routine skin checks 7. Fluids/Electrolytes/Nutrition: strict  I&O's Basic Metabolic Panel:    Component Value Date/Time   NA 135 12/20/2017 0428   K 4.9 12/20/2017 0428   CL 98 (L) 12/20/2017 0428   CO2 26 12/20/2017 0428   BUN 69 (H) 12/20/2017 0428   CREATININE 1.40 (H) 12/20/2017 0428   GLUCOSE 98 12/20/2017 0428   CALCIUM 10.7 (H) 12/20/2017 0428   CALCIUM 10.9 (H) 12/06/2017 0500    Creatinine improving. 8. Atrial fibrillation. Continue Tambocor 100 mg twice a day, Lopressor 12.5 mg twice a day   controlled 9. Hypertension. Controlled. 10. Diastolic congestive heart failure. Monitor for any signs of fluid overload. Weigh patient daily Filed Weights   12/17/17 0540 12/18/17 0244 12/19/17 0421  Weight: 196 lb 10.4 oz (89.2 kg) 213 lb 3 oz (96.7 kg) 199 lb 15.3 oz (90.7 kg)     11. Hypothyroidism. Synthroid 12. Gout. Allopurinol 300 mg daily. Monitor for any gout flareups 13. Constipation. Laxative assistance 14. RUE lymphedema: see #3, continue elevation, massage, edema mgt 15. Hypoalbuminemia   Supplement initiated on 12/19 16. Acute blood loss anemia   Hemoglobin 9.6 on 12/28   Labs ordered for Monday   Continue to monitor 17. 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 Hyponatremia: Resolved   Cont to monitor 20. AKI with diffuse edema   Cr 1.56 on 12/27   Labs pending for today, ordered for  Monday   Appreciate IM recs   Encourage fluids   No IM note yesterday, will cont to follow recs   Will need to be mindful of tumor lysis syndrome    Cont to monitor 21. Leukocytosis   UA equivocal, urine culture with multiple species   CXR reviewed, showing improvement   Cont to monitor 22. Thrombocytopenia:    Platelets 146 on 12/27   Labs ordered for Monday   Continue to monitor 23. Postinfusion fluid overload   Lasix 40 daily started on 12/22, increased dose to 60 for 12/27 24. Urinary  retentionSeems to be improving. 25. Leukocytosis: Resolved   UA+, Ucx no growth   Empiric keflex d/ced  LOS (Days) 11 A TO FACE EVALUATION WAS PERFORMED  Avabella Wailes H Ogle Hoeffner 12/20/2017 8:58 AM

## 2017-12-20 NOTE — Progress Notes (Signed)
Occupational Therapy Session Note  Patient Details  Name: Colleen [name] MRN: [id number] Date of Birth: 28-Apr-1935  Today's Date: 12/20/2017 OT Individual Time: 0922-0932 OT Individual Time Calculation (min): 10 min (make up)    Skilled Therapeutic Interventions/Progress Updates:    1:1. Pt declining EOB/OOB activity however pt discusses changes in functional status and difficulty with self feeding. Pt reporting difficulty manipulating utensil during meals. OT issues red foam handle and demonstrates on utensil. Pt declines attempting to eat small snack, but is "excited to use it with lunch." Exited session with pt supine in bed with daughter present and all needs met  Therapy Documentation Precautions:   See Function Navigator for Current Functional Status.   Therapy/Group: Individual Therapy  Tonny Branch 12/20/2017, 9:33 AM

## 2017-12-20 NOTE — Progress Notes (Signed)
[place] for Warfarin Indication: atrial fibrillation  No Known Allergies  Patient Measurements: Height: 5\' 4"  (162.6 cm) Weight: 199 lb 15.3 oz (90.7 kg) IBW/kg (Calculated) : 54.7   Vital Signs: Temp: 97.6 F (36.4 C) (12/29 0225) Temp Source: Oral (12/29 0225) BP: 133/53 (12/29 0225) Pulse Rate: 58 (12/29 0225)  Labs: Recent Labs    12/18/17 0538 12/19/17 0414 12/19/17 0944 12/20/17 0428  HGB 11.8*  9.5* 9.6*  --  8.9*  HCT 37.1  29.6* 30.2*  --  27.4*  PLT 146*  178 196  --  191  LABPROT 16.9* 17.9*  --  20.3*  INR 1.39 1.49  --  1.75  CREATININE 1.56*  --  1.64* 1.40*   Estimated Creatinine Clearance: 35 mL/min (A) (by C-G formula based on SCr of 1.4 mg/dL (H)).  Assessment: 82 y/o female admitted with lymphadenopathy requiring biopsy - diagnosed with diffuse large B-cell non Hodgkin's Lymphoma. She is on chronic warfarin for history of afib. Warfarin had been on hold for porta cath placement, now s/p R EJ port catheter placement 12/26 and warfarin restarted 12/26. INR trending up but remains subtherapeutic at 1.75 this AM. Hgb trending down 8.9, pltc WNL. PO intake variable, typically 25% or 100% of meals eaten. No bleeding noted.   PTA warfarin dose: 2.5mg  daily except 5mg  MF (INR 1.91 on 12/7)   Goal of Therapy:  INR 2-3 Monitor platelets by anticoagulation protocol: Yes   Plan:  Warfarin 5 mg PO x1 Daily INR Monitor for s/sx bleeding  Zekiel Torian N. Gerarda Fraction, PharmD PGY1 Pharmacy Resident Pager: [phone] 12/20/2017 8:21 AM

## 2017-12-21 ENCOUNTER — Inpatient Hospital Stay (HOSPITAL_COMMUNITY): Payer: Medicare Other | Admitting: Occupational Therapy

## 2017-12-21 ENCOUNTER — Inpatient Hospital Stay (HOSPITAL_COMMUNITY): Payer: Medicare Other

## 2017-12-21 LAB — PROTIME-INR
INR: 1.83
Prothrombin Time: 21 seconds — ABNORMAL HIGH (ref 11.4–15.2)

## 2017-12-21 LAB — BASIC METABOLIC PANEL
Anion gap: 11 (ref 5–15)
BUN: 55 mg/dL — AB (ref 6–20)
CHLORIDE: 97 mmol/L — AB (ref 101–111)
CO2: 27 mmol/L (ref 22–32)
Calcium: 10.4 mg/dL — ABNORMAL HIGH (ref 8.9–10.3)
Creatinine, Ser: 1 mg/dL (ref 0.44–1.00)
GFR calc Af Amer: >60 mL/min (ref >60)
GFR calc non Af Amer: 52 mL/min — ABNORMAL LOW (ref >60)
Glucose, Bld: 113 mg/dL — ABNORMAL HIGH (ref 65–99)
POTASSIUM: 5 mmol/L (ref 3.5–5.1)
SODIUM: 135 mmol/L (ref 135–145)

## 2017-12-21 MED ORDER — WARFARIN SODIUM 5 MG PO TABS
5.0000 mg | ORAL_TABLET | Freq: Once | ORAL | Status: AC
  Administered 2017-12-21: 5 mg via ORAL
  Filled 2017-12-21: qty 1

## 2017-12-21 MED ORDER — FUROSEMIDE 40 MG PO TABS
40.0000 mg | ORAL_TABLET | Freq: Every day | ORAL | Status: DC
  Administered 2017-12-21 – 2017-12-22 (×2): 40 mg via ORAL
  Filled 2017-12-21 (×2): qty 1

## 2017-12-21 NOTE — Progress Notes (Signed)
[place] PHYSICAL MEDICINE & REHABILITATION     PROGRESS NOTE  Subjective/Complaints:  Patient is sitting up in bed this morning.  She is eating.  Her husband is in the room with her.  Patient states that she feels better.  She feels betyter. Less pain in right arm/hand  Objective: Vital Signs: Blood pressure (!) 141/62, pulse (!) 58, temperature 97.6 F (36.4 C), temperature source Oral, resp. rate 18, height 5\' 4"  (1.626 m), weight 193 lb 5.5 oz (87.7 kg), SpO2 97 %.  Elderly female in no acute distress.  She sitting up in bed eating. HEENT exam: Atraumatic, normocephalic extraocular muscles are intact. Neck is supple Chest is clear to auscultation without any increased work of breathing.  Cardiac exam S1 and S2 are regular.  Abdominal exam overweight, active bowel sounds, soft.  Extremities there is diffuse edema in the lower and upper extremities.  Right upper extremity has more edema than the left upper extremity.  Lower extremity edema extends to the mid thighs posteriorly. Neurologic exam: She is alert.  Assessment/Plan: 1. Functional deficits secondary to diffuse large B-cell non-Hodgkin's lymphoma    Medical Problem List and Plan: 1.     Diffuse large B cell non-Hodgkin's lymphoma contributing to decreased functional status.  Continue inpatient rehab.  Rituximab was held.  CHOP is planned as an outpatient.   2.  DVT Prophylaxis/Anticoagulation:  Lab Results  Component Value Date   INR 1.83 12/21/2017   INR 1.75 12/20/2017   INR 1.49 12/19/2017  Warfarin managed per pharmacy.   3. Pain Management with brachial plexus impingement due to lymphadenopathy         Improved control. 4. Mood: Provide emotional support 5. Neuropsych: This patient is capable of making decisions on her own behalf. 6. Skin/Wound Care: Routine skin checks 7. Fluids/Electrolytes/Nutrition: strict I&O's Basic Metabolic Panel:    Component Value Date/Time   NA 135 12/21/2017 0404   K 5.0  12/21/2017 0404   CL 97 (L) 12/21/2017 0404   CO2 27 12/21/2017 0404   BUN 55 (H) 12/21/2017 0404   CREATININE 1.00 12/21/2017 0404   GLUCOSE 113 (H) 12/21/2017 0404   CALCIUM 10.4 (H) 12/21/2017 0404   CALCIUM 10.9 (H) 12/06/2017 0500   Creatinine continues to improve.  BUN is still elevated but also improving.. 8. Atrial fibrillation. Continue Tambocor 100 mg twice a day, Lopressor 12.5 mg twice a day   controlled 9. Hypertension.  Blood pressure is variable but adequately controlled. 10. Diastolic congestive heart failure. Monitor for any signs of fluid overload. Weigh patient daily [facility] Weights   12/18/17 0244 12/19/17 0421 12/21/17 0446  Weight: 213 lb 3 oz (96.7 kg) 199 lb 15.3 oz (90.7 kg) 193 lb 5.5 oz (87.7 kg)     11. Hypothyroidism. Synthroid 12. Gout. Allopurinol 300 mg daily. Monitor for any gout flareups 13. Constipation. Laxative assistance 14. RUE lymphedema: see #3, continue elevation, massage, edema mgt 15. Hypoalbuminemia   Supplement initiated on 12/19 16. Acute blood loss anemia   Hemoglobin 9.6 on 12/28   Labs ordered for Monday   Continue to monitor 17. 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 Hyponatremia: Resolved   Cont to monitor 20. AKI with diffuse edema   Cr 1.56 on 12/27 Creatinine improved to 1.0 on 12/21/2017 21. Leukocytosis  UA equivocal, urine culture with multiple species   CXR reviewed, showing improvement   Cont to monitor 22. Thrombocytopenia:    Platelets 146 on 12/27   Labs ordered for Monday   Continue to monitor 23. Postinfusion fluid overload   We will continue to monitor edema. 24. Urinary retentionSeems to be improving. 25. Leukocytosis: Resolved   UA+, Ucx no growth   Empiric keflex d/ced  LOS (Days) 12 A TO FACE EVALUATION WAS PERFORMED  Lilton Pare H Khila Papp 12/21/2017 7:07 AM

## 2017-12-21 NOTE — Progress Notes (Signed)
Occupational Therapy Session Note  Patient Details  Name: Colleen [name] MRN: [id number] Date of Birth: 14-Jan-1935  Today's Date: 12/21/2017 OT Individual Time: 9211-9417 OT Individual Time Calculation (min): 29 min    Short Term Goals: Week 2:  OT Short Term Goal 1 (Week 2): Pt will maintain static sitting during selfcare tasks with mod assist.  OT Short Term Goal 2 (Week 2): Pt will perform UB bathing with mod assist using AE PRN. OT Short Term Goal 3 (Week 2): Pt will perform stand pivot toilet transfer to the 3:1 with mod assist.  OT Short Term Goal 4 (Week 2): Pt will tolerate AAROM of the right shoulder 0-70 degrees for edema control and for greater use with selfcare tasks.    Skilled Therapeutic Interventions/Progress Updates:    Pt completed transfer from wheelchair to EOB with total assist stand pivot.  Total assist also needed for transition from sitting to supine as well.  Once in the bed therapist performed retrograde massage to the RUE digits, hand, and forearm.  Therapist then positioned UE on pillows with hand higher than forearm to help influence edema back into the circulatory system.  Encouraged pt to continue working on squeezing foam block with the right hand to assist with this as well.  Pt left with call button in reach as well as granddaughter in the room as well.     Therapy Documentation Precautions:  Precautions Precautions: Fall, Other (comment) Precaution Comments: Chemo; BP only in LE; incontinent with mobility; R UE pain/lymphedema Restrictions Weight Bearing Restrictions: No  Pain: Pain Assessment Pain Assessment: Faces Faces Pain Scale: Hurts a little bit Pain Type: Acute pain Pain Location: Arm Pain Orientation: Right Pain Descriptors / Indicators: Discomfort Pain Onset: With Activity Pain Intervention(s): Repositioned;Massage ADL: See Function Navigator for Current Functional Status.   Therapy/Group: Individual Therapy  Kyliana Standen  OTR/L 12/21/2017, 3:56 PM

## 2017-12-21 NOTE — Progress Notes (Signed)
Occupational Therapy Session Note  Patient Details  Name: Colleen [name] MRN: [id number] Date of Birth: January 11, 1935  Today's Date: 12/21/2017 OT Individual Time: 1101-1204 OT Individual Time Calculation (min): 63 min    Short Term Goals: Week 2:  OT Short Term Goal 1 (Week 2): Pt will maintain static sitting during selfcare tasks with mod assist.  OT Short Term Goal 2 (Week 2): Pt will perform UB bathing with mod assist using AE PRN. OT Short Term Goal 3 (Week 2): Pt will perform stand pivot toilet transfer to the 3:1 with mod assist.  OT Short Term Goal 4 (Week 2): Pt will tolerate AAROM of the right shoulder 0-70 degrees for edema control and for greater use with selfcare tasks.    Skilled Therapeutic Interventions/Progress Updates:    Pt completed bathing sit to stand from the wheelchair during session.  She needed use of the Steady and total assist +2 (pt 30%) for sit to stand X4 and standing in order for therapist to assist with peri washing and donning of cream on her buttocks.  With standing pt was able to use the LUE to assist with pulling up on the steady, but still exhibited flexed posture in standing with her head and trunk.  Max assist for washing UB in sitting with emphasis on maintaining midline orientation while working.  She tends to lean her trunk to the left and head maintains latera flexion to the right.  Max demonstrational cueing to maintain upright midline alignment.  Total assist for all dressing to donn pullover gown and for doffing and donning gripper socks.  Finished session with pt up in wheelchair with family present and call button in reach.  Oxygen sats checked at 98% on 2Ls and 93% on room air with selfcare in sitting.   Will continue to monitor.    Therapy Documentation Precautions:  Precautions Precautions: Fall, Other (comment) Precaution Comments: Chemo; BP only in LE; incontinent with mobility; R UE pain/lymphedema Restrictions Weight Bearing Restrictions:  No  Pain: Pain Assessment Pain Assessment: Faces Faces Pain Scale: Hurts even more Pain Type: Chronic pain Pain Location: Arm Pain Orientation: Right Pain Descriptors / Indicators: Discomfort Pain Onset: With Activity Pain Intervention(s): Repositioned ADL: See Function Navigator for Current Functional Status.   Therapy/Group: Individual Therapy  Iolani Twilley OTR/L 12/21/2017, 12:25 PM

## 2017-12-21 NOTE — Progress Notes (Signed)
[place] for Warfarin Indication: atrial fibrillation  No Known Allergies  Patient Measurements: Height: (mistaken entry) Weight: 193 lb 5.5 oz (87.7 kg) IBW/kg (Calculated) : 54.7   Vital Signs: Temp: 97.6 F (36.4 C) (12/30 0500) Temp Source: Oral (12/30 0500) BP: 141/62 (12/30 0500) Pulse Rate: 58 (12/30 0500)  Labs: Recent Labs    12/19/17 0414 12/19/17 0944 12/20/17 0428 12/21/17 0404  HGB 9.6*  --  8.9*  --   HCT 30.2*  --  27.4*  --   PLT 196  --  191  --   LABPROT 17.9*  --  20.3* 21.0*  INR 1.49  --  1.75 1.83  CREATININE  --  1.64* 1.40* 1.00   Estimated Creatinine Clearance: 48.1 mL/min (by C-G formula based on SCr of 1 mg/dL).  Assessment: 82 y/o female admitted with lymphadenopathy requiring biopsy - diagnosed with diffuse large B-cell non Hodgkin's Lymphoma. She is on chronic warfarin for history of afib. Warfarin had been on hold for porta cath placement, now s/p R EJ port catheter placement 12/26 and warfarin restarted 12/26. INR trending up but remains subtherapeutic at 1.83 this AM. Hgb trending down 8.9, pltc WNL. PO intake variable, 10-100% of meals eaten. No bleeding noted.   PTA warfarin dose: 2.5mg  daily except 5mg  MF (INR 1.91 on 12/7)   Goal of Therapy:  INR 2-3 Monitor platelets by anticoagulation protocol: Yes   Plan:  Warfarin 5 mg PO x1 Daily INR Monitor for s/sx bleeding  Erin N. Gerarda Fraction, PharmD PGY1 Pharmacy Resident Pager: [phone] 12/21/2017 7:20 AM

## 2017-12-21 NOTE — Progress Notes (Signed)
PROGRESS NOTE  Colleen [name] TKP:[id number] DOB: [birth year] DOA: 12/09/2017 PCP: Ann Held, DO  Brief Narrative: 82yow presented on 11/29/2017 planes of swelling and pain right arm as well as mass in her right neck and was found to have rapidly developing diffuse lymphadenopathy.  Diffuse large B cell non-Hodgkin's lymphoma was confirmed on lymph node biopsy on 12/02/17. Discharged to [facility] 12/18. Medicine consulted given AKI on BMP from 12/16/2017 with creatinine of 2  Assessment/Plan AKI. New since 12/25. ACE-I on hold. Urinary retention noted previously. - creatinine has normalized s/p foley placement and BUN is trending down. Lab pattern and clinically progress suggest urinary retention as etiology; given rapid normalization I do not think mild left hydro is contributing; patient asymptomatic, would suggest f/u as outpatient at this point.  BLE edema. Etiology unclear. Echo 11/2017 with normal LVEF. Grade 1 diastolic CHF - massive volume overload noted, but excellent urine output. Will augment with Lasix.  Diffuse large B-cell NHL dx 11/2017, immunotherapy initiated 12/13 - per oncology.   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 - per oncology  Afib - stable; continue flecainide, Lopressor, warfarin  Overall improving; will check BMP in AM   Murray Hodgkins, MD  [facility] Direct contact: [phone] --Via amion app OR  --[email or web address]; password [email or web address]  7PM-7AM contact night coverage as above 12/21/2017, 12:47 PM  LOS: 12 days    Procedures:  12.26 Right EJ port placement   Interval history/Subjective: Feels better.  Objective: Vitals:  Vitals:   12/20/17 1928 12/21/17 0500  BP: 121/66 (!) 141/62  Pulse: 64 (!) 58  Resp:  18  Temp:  97.6 F (36.4 C)  SpO2:  97%    Exam:  Constitutional:   . Appears calm and comfortable Respiratory:  . CTA bilaterally, no w/r/r.  . Respiratory effort  normal. Cardiovascular:  . RRR, no m/r/g . 3+ BLE extremity edema RUE edema noted Psychiatric:  . Mental status o Mood, affect appropriate   I have personally reviewed the following:  Filed Weights   12/18/17 0244 12/19/17 0421 12/21/17 0446  Weight: 96.7 kg (213 lb 3 oz) 90.7 kg (199 lb 15.3 oz) 87.7 kg (193 lb 5.5 oz)   Weight change:   UOP: 3225 I/O since admission: inaccurate Last BM charted: 12/29  Labs:  BUN 60 > 70 > 69 >> 55  Creatinine 1.56 >> 1.64 > 1.40 >> 1.00  K+ 5.0  Scheduled Meds: . allopurinol  300 mg Oral Daily  . feeding supplement (PRO-STAT SUGAR FREE 64)  30 mL Oral BID  . flecainide  100 mg Oral BID  . furosemide  40 mg Oral Daily  . gabapentin  300 mg Oral TID  . Gerhardt's butt cream   Topical BID  . levothyroxine  88 mcg Oral QAC breakfast  . metoprolol tartrate  12.5 mg Oral BID  . polyethylene glycol  17 g Oral Daily  . predniSONE  60 mg Oral Q breakfast  . senna-docusate  1 tablet Oral QHS  . valACYclovir  500 mg Oral Daily  . warfarin  5 mg Oral ONCE-1800  . Warfarin - Pharmacist Dosing Inpatient   Does not apply q1800   Continuous Infusions: . sodium chloride    . famotidine      Active Problems:   AKI (acute kidney injury) ([place])   Debility   Lymphedema   Hypoalbuminemia due to protein-calorie malnutrition (H[facility])   Hypercalcemia   HSV (herpes simplex virus) anogenital  infection   Leukocytosis   SOB (shortness of breath)   Hypervolemia   Urinary retention   Urinary incontinence   Peripheral edema   LOS: 12 days

## 2017-12-21 NOTE — Plan of Care (Signed)
Not Progressing RH BLADDER ELIMINATION RH STG MANAGE BLADDER WITH ASSISTANCE Description STG Manage Bladder With Total Assistance   12/21/2017 0944 - Not Progressing by Brita Romp, RN Ability to achieve a regular elimination pattern will improve Description Pt will develop regular elimination pattern with bladder training with moderate assistance  12/21/2017 0944 - Not Progressing by Brita Romp, RN Pt requires indwelling catheter at this time. Pt remains unable to manage bladder. RH SKIN INTEGRITY RH STG SKIN FREE OF INFECTION/BREAKDOWN Description Patient will be free of new breakdown prior to discharge  12/21/2017 0944 - Not Progressing by Brita Romp, RN RH STG MAINTAIN SKIN INTEGRITY WITH ASSISTANCE Description STG Maintain Skin Integrity With Max Assistance.   12/21/2017 0944 - Not Progressing by Brita Romp, RN  Pt continues to weep serous fluid from all extremities. Pt's bottom continues with MASD.

## 2017-12-21 NOTE — Progress Notes (Signed)
Physical Therapy Session Note  Patient Details  Name: Colleen [name] MRN: [id number] Date of Birth: June 18, 1935  Today's Date: 12/21/2017 PT Individual Time: 0902-1000 PT Individual Time Calculation (min): 58 min   Short Term Goals: Week 2:  PT Short Term Goal 1 (Week 2): Pt will transfer sit<>stand w/ max assist x1 using LRAD. PT Short Term Goal 2 (Week 2): Pt will tolerate sitting up in recliner or w/c in between all therapies during duration of 1 day.  PT Short Term Goal 3 (Week 2): Pt will perform sit<>supine transfer w/ mod assist.  PT Short Term Goal 4 (Week 2): Pt will maintain static sitting balance w/o back support for 2 min w/ max assist.   Skilled Therapeutic Interventions/Progress Updates:    Pt supine in bed upon PT arrival, agreeable to therapy tx and reports pain 8/10 in R UE. Pt transferred from supine>sitting EOB with max assist, verbal cues for technique and manual facilitation for weightshifting. Pt performed x 2 sit<>stands  (plus multiple unsuccessful attempts) using the RW, max assist +2, tactile and verbal cues for hip extension/trunk extension. Pt performed sit<>stands x 2 with the stedy, max assist +2. Pt transferred from bed>w/c using the stedy, total assist. Pt transported to gym in w/c total assist. Pt performed x 2 sit<>stands in the parallel bars with max assist +2, using a mirror for visual feedback to encourage trunk and hip extension in standing. Pt transported back to room and left seated in w/c with needs in reach, husband present.   Therapy Documentation Precautions:  Precautions Precautions: Fall, Other (comment) Precaution Comments: Chemo; BP only in LE; incontinent with mobility; R UE pain/lymphedema Restrictions Weight Bearing Restrictions: No   See Function Navigator for Current Functional Status.   Therapy/Group: Individual Therapy  Netta Corrigan, PT, DPT 12/21/2017, 10:39 AM

## 2017-12-22 ENCOUNTER — Inpatient Hospital Stay (HOSPITAL_COMMUNITY): Payer: Medicare Other

## 2017-12-22 ENCOUNTER — Inpatient Hospital Stay (HOSPITAL_COMMUNITY): Payer: Medicare Other | Admitting: Occupational Therapy

## 2017-12-22 DIAGNOSIS — R05 Cough: Secondary | ICD-10-CM

## 2017-12-22 DIAGNOSIS — Z9981 Dependence on supplemental oxygen: Secondary | ICD-10-CM

## 2017-12-22 DIAGNOSIS — R059 Cough, unspecified: Secondary | ICD-10-CM

## 2017-12-22 LAB — BASIC METABOLIC PANEL
Anion gap: 13 (ref 5–15)
Anion gap: 11 (ref 5–15)
BUN: 44 mg/dL — AB (ref 6–20)
BUN: 42 mg/dL — ABNORMAL HIGH (ref 6–20)
CHLORIDE: 95 mmol/L — AB (ref 101–111)
CHLORIDE: 97 mmol/L — AB (ref 101–111)
CO2: 28 mmol/L (ref 22–32)
CO2: 28 mmol/L (ref 22–32)
CREATININE: 0.86 mg/dL (ref 0.44–1.00)
CREATININE: 0.88 mg/dL (ref 0.44–1.00)
Calcium: 10.5 mg/dL — ABNORMAL HIGH (ref 8.9–10.3)
Calcium: 10.9 mg/dL — ABNORMAL HIGH (ref 8.9–10.3)
GFR calc Af Amer: >60 mL/min (ref >60)
GFR calc non Af Amer: >60 mL/min (ref >60)
GFR calc non Af Amer: >60 mL/min (ref >60)
GLUCOSE: 101 mg/dL — AB (ref 65–99)
Glucose, Bld: 78 mg/dL (ref 65–99)
Potassium: 4.7 mmol/L (ref 3.5–5.1)
Potassium: 4.6 mmol/L (ref 3.5–5.1)
Sodium: 136 mmol/L (ref 135–145)
Sodium: 136 mmol/L (ref 135–145)

## 2017-12-22 LAB — PROTIME-INR
INR: 2.25
Prothrombin Time: 24.7 seconds — ABNORMAL HIGH (ref 11.4–15.2)

## 2017-12-22 MED ORDER — WARFARIN SODIUM 2.5 MG PO TABS
2.5000 mg | ORAL_TABLET | Freq: Once | ORAL | Status: AC
Start: 2017-12-22 — End: 2017-12-22
  Administered 2017-12-22: 2.5 mg via ORAL
  Filled 2017-12-22: qty 1

## 2017-12-22 MED ORDER — FUROSEMIDE 20 MG PO TABS
20.0000 mg | ORAL_TABLET | Freq: Two times a day (BID) | ORAL | Status: DC
  Administered 2017-12-22 – 2017-12-27 (×11): 20 mg via ORAL
  Filled 2017-12-22 (×11): qty 1

## 2017-12-22 MED ORDER — DIPHENHYDRAMINE HCL 25 MG PO CAPS
25.0000 mg | ORAL_CAPSULE | Freq: Every evening | ORAL | Status: DC | PRN
  Administered 2017-12-22 – 2017-12-25 (×3): 25 mg via ORAL
  Filled 2017-12-22 (×3): qty 1

## 2017-12-22 NOTE — Progress Notes (Signed)
Physical Therapy Session Note  Patient Details  Name: Colleen [name] MRN: [id number] Date of Birth: May 26, 1935  Today's Date: 12/22/2017 PT Individual Time: 1110-1204 PT Individual Time Calculation (min): 54 min   Short Term Goals: Week 2:  PT Short Term Goal 1 (Week 2): Pt will transfer sit<>stand w/ max assist x1 using LRAD. PT Short Term Goal 2 (Week 2): Pt will tolerate sitting up in recliner or w/c in between all therapies during duration of 1 day.  PT Short Term Goal 3 (Week 2): Pt will perform sit<>supine transfer w/ mod assist.  PT Short Term Goal 4 (Week 2): Pt will maintain static sitting balance w/o back support for 2 min w/ max assist.   Skilled Therapeutic Interventions/Progress Updates:    Pt supine in bed this session, agreeable to therapy tx but requesting to stay in bed. Pt denies pain in R UE at rest, only with movement. therapist performed retrograde massage to the RUE digits, hand, and forearm.  Therapist then positioned UE on pillows with hand higher than forearm to help influence edema back into the circulatory system. Pt with increaseing edema in B LEs as well, therapist performed retrograde massage to toes, foot and ankle. Pt performed LE therex for strengthening, each active assisted 2 x 10: straight leg raises, heel slides, SAQ, ankle circle and pumps, hip abduction. Pt performed L UE therex for strengthening with 2# weight,  2 x 10 each: bicep curls, shoulder flexion, chest press. Pt left supine in bed with needs in reach.    Therapy Documentation Precautions:  Precautions Precautions: Fall, Other (comment) Precaution Comments: Chemo; BP only in LE; incontinent with mobility; R UE pain/lymphedema Restrictions Weight Bearing Restrictions: No   See Function Navigator for Current Functional Status.   Therapy/Group: Individual Therapy  Netta Corrigan, PT, DPT 12/22/2017, 7:55 AM

## 2017-12-22 NOTE — Plan of Care (Signed)
  Progressing RH BOWEL ELIMINATION RH STG MANAGE BOWEL WITH ASSISTANCE Description STG Manage Bowel with Total Assistance.   12/22/2017 1019 - Progressing by Brita Romp, RN RH BLADDER ELIMINATION RH STG MANAGE BLADDER WITH ASSISTANCE Description STG Manage Bladder With Total Assistance   12/22/2017 1019 - Progressing by Brita Romp, RN RH STG MANAGE BLADDER WITH EQUIPMENT WITH ASSISTANCE Description STG Manage Bladder With Equipment With Max Assistance   12/22/2017 1019 - Progressing by Brita Romp, RN RH SKIN INTEGRITY RH STG ABLE TO PERFORM INCISION/WOUND CARE W/ASSISTANCE Description STG Able To Perform Incision/Wound Care With Total Assistance.   12/22/2017 1019 - Progressing by Brita Romp, RN RH SAFETY RH STG ADHERE TO SAFETY PRECAUTIONS W/ASSISTANCE/DEVICE Description STG Adhere to Safety Precautions With  Mod I Assistance/Device.  12/22/2017 1019 - Progressing by Brita Romp, RN RH PAIN MANAGEMENT RH STG PAIN MANAGED AT OR BELOW PT'S PAIN GOAL Description Less than 4  12/22/2017 1019 - Progressing by Brita Romp, RN RH KNOWLEDGE DEFICIT GENERAL RH STG INCREASE KNOWLEDGE OF SELF CARE AFTER HOSPITALIZATION Description Pt and family will demonstrate increased knowledge of self-care at discharge with Mod I assistance  12/22/2017 1019 - Progressing by Brita Romp, RN   Not Progressing [place] to achieve a regular elimination pattern will improve Description Pt will develop regular elimination pattern with bladder training with moderate assistance  12/22/2017 1019 - Not Progressing by Brita Romp, RN RH SKIN INTEGRITY RH STG SKIN FREE OF INFECTION/BREAKDOWN Description Patient will be free of new breakdown prior to discharge  12/22/2017 1019 - Not Progressing by Brita Romp, RN RH STG MAINTAIN SKIN INTEGRITY WITH ASSISTANCE Description STG Maintain Skin Integrity With Max  Assistance.   12/22/2017 1019 - Not Progressing by Brita Romp, RN

## 2017-12-22 NOTE — Progress Notes (Signed)
PROGRESS NOTE  Colleen [name] ACZ:[id number] DOB: November 07, 1935 DOA: 12/09/2017 PCP: Ann Held, DO  Brief Narrative: 82yow presented on 11/29/2017 planes of swelling and pain right arm as well as mass in her right neck and was found to have rapidly developing diffuse lymphadenopathy.  Diffuse large B cell non-Hodgkin's lymphoma was confirmed on lymph node biopsy on 12/02/17. Discharged to [facility] 12/18. Medicine consulted given AKI on BMP from 12/16/2017 with creatinine of 2  Assessment/Plan AKI secondary to urinary retention.  - creatinine has normalized s/p foley placement and BUN continues to trend down. Lab pattern and clinically progress suggested urinary retention as etiology; given rapid normalization I do not think mild left hydro is contributing - would suggest urology f/u as outpatient at this point both for urinary retention and left hydro. - would leave foley for now given need for immunotherapy and chemotherapy - suggest add Flomax when planning to d/c foley  BLE edema. Etiology unclear. Echo 11/2017 with normal LVEF. Grade 1 diastolic CHF - massive volume overload noted - suggest furosemide BID, track I/O, daily weights, BMP  Diffuse large B-cell NHL dx 11/2017, immunotherapy initiated 12/13 - per oncology.   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 - per oncology   AKI resolved; continue Lasix for volume overload; see recs above. At this point I will sign off. Please call if I can be off assistanct 12/31 and 1/1. If needed 1/2 >> please page [phone]   Murray Hodgkins, MD  [facility] Direct contact: [phone] --[place] [place]  --[email or web address]; password [email or web address]  7PM-7AM contact night coverage as above 12/22/2017, 11:36 AM  LOS: 13 days    Procedures:  12.26 Right EJ port placement   Interval history/Subjective: Continues to feel better; edema right hand gone.  Objective: Vitals:  Vitals:     12/22/17 0547 12/22/17 0903  BP: (!) 137/54 138/60  Pulse: 63 68  Resp:    Temp: 98.4 F (36.9 C)   SpO2: 96%     Exam:  Constitutional:   . Appears calm and comfortable Respiratory:  . CTA bilaterally, no w/r/r.  . Respiratory effort normal.  Cardiovascular:  . RRR, no m/r/g . 3+ BLE extremity edema; RUE somewhat decreased, right hand edema resolved Psychiatric:  . Mental status o Mood, affect appropriate  I have personally reviewed the following:  Filed Weights   12/19/17 0421 12/21/17 0446 12/22/17 0555  Weight: 90.7 kg (199 lb 15.3 oz) 87.7 kg (193 lb 5.5 oz) 87.3 kg (192 lb 7.4 oz)   Weight change: -0.4 kg (-14.1 oz)  UOP: 1850 I/O since admission: inaccurate Last BM charted: 12/29  Labs:  BUN 60 > 70 > 69 >> 55 >> 42  Creatinine 1.56 >> 1.64 > 1.40 >> 1.00 >> 0.88  K+ 4.6  Scheduled Meds: . allopurinol  300 mg Oral Daily  . feeding supplement (PRO-STAT SUGAR FREE 64)  30 mL Oral BID  . flecainide  100 mg Oral BID  . furosemide  40 mg Oral Daily  . gabapentin  300 mg Oral TID  . Gerhardt's butt cream   Topical BID  . levothyroxine  88 mcg Oral QAC breakfast  . metoprolol tartrate  12.5 mg Oral BID  . polyethylene glycol  17 g Oral Daily  . predniSONE  60 mg Oral Q breakfast  . senna-docusate  1 tablet Oral QHS  . Warfarin - Pharmacist Dosing Inpatient   Does not apply q1800   Continuous  Infusions: . sodium chloride    . famotidine      Active Problems:   AKI (acute kidney injury) ([place])   Debility   Lymphedema   Hypoalbuminemia due to protein-calorie malnutrition (HCC)   Hypercalcemia   HSV (herpes simplex virus) anogenital infection   Leukocytosis   SOB (shortness of breath)   Hypervolemia   Urinary retention   Urinary incontinence   Peripheral edema   Cough   Supplemental oxygen dependent   LOS: 13 days

## 2017-12-22 NOTE — Progress Notes (Signed)
Occupational Therapy Session Note  Patient Details  Name: Colleen [name] MRN: [id number] Date of Birth: [birth year]  Today's Date: 12/22/2017 OT Individual Time: 1829-9371 OT Individual Time Calculation (min): 59 min    Short Term Goals: Week 2:  OT Short Term Goal 1 (Week 2): Pt will maintain static sitting during selfcare tasks with mod assist.  OT Short Term Goal 2 (Week 2): Pt will perform UB bathing with mod assist using AE PRN. OT Short Term Goal 3 (Week 2): Pt will perform stand pivot toilet transfer to the 3:1 with mod assist.  OT Short Term Goal 4 (Week 2): Pt will tolerate AAROM of the right shoulder 0-70 degrees for edema control and for greater use with selfcare tasks.    Skilled Therapeutic Interventions/Progress Updates:    Upon entering the room, pt supine in bed with c/o feeling unwell and not sleeping well. Pt agreeable to bathing and dressing but refused OOB this session secondary to fatigue. Pt with increased pain with movement of R UE during clothing management and hygiene this session. Pt bathing UB and parts of B LE's with L UE. Pt declined her own clothing secondary to "all this weeping" . Pt donned hospital gown with set up A. Retrograde massage to R UE and elevated at end of session. Pt remained supine and attempting to rest as therapist exited the room. Call bell and all needed items within reach upon exiting the room.   Therapy Documentation Precautions:  Precautions Precautions: Fall, Other (comment) Precaution Comments: Chemo; BP only in LE; incontinent with mobility; R UE pain/lymphedema Restrictions Weight Bearing Restrictions: No General:   Vital Signs: Therapy Vitals Pulse Rate: 68 BP: 138/60  See Function Navigator for Current Functional Status.   Therapy/Group: Individual Therapy  Gypsy Decant 12/22/2017, 12:41 PM

## 2017-12-22 NOTE — Progress Notes (Signed)
Occupational Therapy Session Note  Patient Details  Name: Colleen [name] MRN: [id number] Date of Birth: 10-Sep-1935  Today's Date: 12/22/2017 OT Individual Time: 1420-1500 OT Individual Time Calculation (min): 40 min   Short Term Goals: Week 2:  OT Short Term Goal 1 (Week 2): Pt will maintain static sitting during selfcare tasks with mod assist.  OT Short Term Goal 2 (Week 2): Pt will perform UB bathing with mod assist using AE PRN. OT Short Term Goal 3 (Week 2): Pt will perform stand pivot toilet transfer to the 3:1 with mod assist.  OT Short Term Goal 4 (Week 2): Pt will tolerate AAROM of the right shoulder 0-70 degrees for edema control and for greater use with selfcare tasks.    Skilled Therapeutic Interventions/Progress Updates:    Pt greeted supine in bed with family present. Pleased with noticeable decreased Rt hand swelling. Pt able to move fingers. Told OT the one thing she missed most after losing her functional hand use was crocheting. Retrieved crocheting needle, yarn, and adaptations in case needed. Elevated her hand above heart on pillow for edema mgt (significant edema still present in forearm). Gentle Rt hand AROM during crocheting. She required assist to begin chain due to [facility] deficits and slight tremulous activity. Pt able to continue chaining with occasional assist from OT to increase size of loops in order to lessen task demands. 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 Her family was present and daughter took picture of her participating in this meaningful task again. At end of session, elevated pts Rt arm, though this was very painful for her. Pt left with family and all needs within reach.   Therapy Documentation Precautions:  Precautions Precautions: Fall, Other (comment) Precaution Comments: Chemo; BP only in LE; incontinent with mobility; R UE pain/lymphedema Restrictions Weight Bearing  Restrictions: No Vital Signs: Therapy Vitals Temp: 98.6 F (37 C) Temp Source: Oral Pulse Rate: 66 Resp: 17 BP: (!) 137/58 Patient Position (if appropriate): Sitting Oxygen Therapy SpO2: 97 % O2 Device: Nasal Cannula O2 Flow Rate (L/min): 1.5 L/min Pain: Pain Assessment Pain Assessment: 0-10 Pain Score: 0-No pain ADL:     See Function Navigator for Current Functional Status.   Therapy/Group: Individual Therapy  Cheynne Virden A Sallyanne Birkhead 12/22/2017, 3:29 PM

## 2017-12-22 NOTE — Progress Notes (Signed)
[place] for Warfarin Indication: atrial fibrillation  No Known Allergies  Patient Measurements: Height: (mistaken entry) Weight: 192 lb 7.4 oz (87.3 kg) IBW/kg (Calculated) : 54.7   Vital Signs: Temp: 98.4 F (36.9 C) (12/31 0547) Temp Source: Oral (12/31 0547) BP: 138/60 (12/31 0903) Pulse Rate: 68 (12/31 0903)  Labs: Recent Labs    12/20/17 0428 12/21/17 0404 12/22/17 0355 12/22/17 0911  HGB 8.9*  --   --   --   HCT 27.4*  --   --   --   PLT 191  --   --   --   LABPROT 20.3* 21.0* 24.7*  --   INR 1.75 1.83 2.25  --   CREATININE 1.40* 1.00 0.86 0.88   Estimated Creatinine Clearance: 54.5 mL/min (by C-G formula based on SCr of 0.88 mg/dL).  Assessment: 82 y/o female admitted with lymphadenopathy requiring biopsy - diagnosed with diffuse large B-cell non Hodgkin's Lymphoma. She is on chronic warfarin for history of afib. Warfarin had been on hold for porta cath placement, now s/p R EJ port catheter placement 12/26 and warfarin restarted 12/26. INR trending up 1.83 > 2.25 this morning, has been on 5mg  daily in the past 5 days.  PO intake variable, 10-100% of meals eaten. No bleeding noted.   PTA warfarin dose: 2.5mg  daily except 5mg  MF (INR 1.91 on 12/7)   Goal of Therapy:  INR 2-3 Monitor platelets by anticoagulation protocol: Yes   Plan:  Warfarin 2.5 mg PO x1 Daily INR Monitor for s/sx bleeding  Maryanna Shape, PharmD, BCPS  Clinical Pharmacist  Pager: [phone]   12/22/2017 2:11 PM

## 2017-12-22 NOTE — Progress Notes (Signed)
[facility] PHYSICAL MEDICINE & REHABILITATION     PROGRESS NOTE  Subjective/Complaints:  Pt seen laying in bed this AM.  She did not sleep well overnight.  She notes improvement in UE edema and hand grip.  ROS: Denies CP, nausea, vomiting, diarrhea.  Objective: Vital Signs: Blood pressure 138/60, pulse 68, temperature 98.4 F (36.9 C), temperature source Oral, resp. rate 18, height 5\' 4"  (1.626 m), weight 87.3 kg (192 lb 7.4 oz), SpO2 96 %. No results found. Recent Labs    12/20/17 0428  WBC 3.8*  HGB 8.9*  HCT 27.4*  PLT 191   Recent Labs    12/21/17 0404 12/22/17 0355  NA 135 136  K 5.0 4.7  CL 97* 95*  GLUCOSE 113* 101*  BUN 55* 44*  CREATININE 1.00 0.86  CALCIUM 10.4* 10.5*   CBG (last 3)  No results for input(s): GLUCAP in the last 72 hours.  Wt Readings from Last 3 Encounters:  12/22/17 87.3 kg (192 lb 7.4 oz)  12/08/17 88 kg (194 lb 0.1 oz)  11/28/17 73 kg (161 lb)    Physical Exam:  BP 138/60   Pulse 68   Temp 98.4 F (36.9 C) (Oral)   Resp 18   Ht 5\' 4"  (1.626 m)   Wt 87.3 kg (192 lb 7.4 oz)   SpO2 96%   BMI 33.04 kg/m  Constitutional: NAD. Obese  HENT: Normocephalic. Atraumatic Eyes: EOM are normal. No discharge.  Cardiovascular: RRR. No JVD.  Respiratory: No respiratory distress. Clear to auscultation. +Ord GI: Bowel sounds are normal. She exhibits no distension.  Musculoskeletal: +Edema in all extremities >> LE Neurological: She isalertand oriented. Motor: RUE: Shoulder abduction 1/5, elbow flex/extension 2-/5, hand grip 3+/5 (hand grip improving) LUE: 4/5 proximal to distal B/L LE: HF 4/5, KE 4/5, ADF/PF 4+/5.  Skin. Warm and dry Psychiatric: She has a normal mood and affect. Her behavior is normal. Judgment and thought content normal.   Assessment/Plan: 1. Functional deficits secondary to diffuse large B-cell non-Hodgkin's lymphoma which require 3+ hours per day of interdisciplinary therapy in a comprehensive inpatient rehab  setting. Physiatrist is providing close team supervision and 24 hour management of active medical problems listed below. Physiatrist and rehab team continue to assess barriers to discharge/monitor patient progress toward functional and medical goals.  Function:  Bathing Bathing position   Position: Wheelchair/chair at sink  Bathing parts Body parts bathed by patient: Chest, Abdomen, Right upper leg, Left upper leg Body parts bathed by helper: Buttocks, Front perineal area, Back, Left lower leg, Right lower leg, Right arm, Left arm  Bathing assist Assist Level: 2 helpers   Set up : To open containers, To obtain items  Upper Body Dressing/Undressing Upper body dressing   What is the patient wearing?: Pull over shirt/dress       Pull over shirt/dress - Perfomed by helper: Thread/unthread right sleeve, Thread/unthread left sleeve, Put head through opening, Pull shirt over trunk        Upper body assist Assist Level: Touching or steadying assistance(Pt > 75%)      Lower Body Dressing/Undressing Lower body dressing   What is the patient wearing?: Non-skid slipper socks           Non-skid slipper socks- Performed by helper: Don/doff right sock, Don/doff left sock               TED Hose - Performed by helper: Don/doff left TED hose, Don/doff right TED hose  Lower body assist  Toileting Toileting Toileting activity did not occur: No continent bowel/bladder event   Toileting steps completed by helper: Adjust clothing prior to toileting, Performs perineal hygiene, Adjust clothing after toileting    Toileting assist Assist level: Two helpers   Transfers Chair/bed transfer   Chair/bed transfer method: Stand pivot Chair/bed transfer assist level: Total assist (Pt < 25%) Chair/bed transfer assistive device: Mechanical lift Mechanical lift: Stedy   Locomotion Ambulation     Max distance: 4 ft Assist level: Touching or steadying assistance (Pt > 75%)    Wheelchair   Type: Manual Max wheelchair distance: (10 ft in room) Assist Level: Moderate assistance (Pt 50 - 74%)  Cognition Comprehension Comprehension assist level: Understands basic 90% of the time/cues < 10% of the time  Expression Expression assist level: Expresses basic needs/ideas: With no assist  Social Interaction Social Interaction assist level: Interacts appropriately 90% of the time - Needs monitoring or encouragement for participation or interaction.  Problem Solving Problem solving assist level: Solves basic 75 - 89% of the time/requires cueing 10 - 24% of the time  Memory Memory assist level: Recognizes or recalls 75 - 89% of the time/requires cueing 10 - 24% of the time     Medical Problem List and Plan: 1.  Decreased functional mobility secondary to diffuse large B-cell non-Hodgkin's lymphoma.   Continue CIR    Per Heme/Onc, notes reviewed, plan to hold Rituximab, second round of steroids on 12/28.   Plan CHOP with infusional doxorubicin as an outpatient per Heme/Onc 2.  DVT Prophylaxis/Anticoagulation:  Monitor for any bleeding episodes   Coumadin therapeutic on 12/31   LE dopplers ordered for LE edema 3. Pain Management with brachial plexus impingement due to lymphadenopathy             Neurontin 300 mg 3 times a day, oxycodone as needed 4. Mood: Provide emotional support 5. Neuropsych: This patient is capable of making decisions on her own behalf. 6. Skin/Wound Care: Routine skin checks 7. Fluids/Electrolytes/Nutrition: Routine I&O's  8. Atrial fibrillation. Continue Tambocor 100 mg twice a day, Lopressor 12.5 mg twice a day   Controlled on 12/31 9. Hypertension. Monitor with increased mobility   Lisinopril 20 mg daily d/ced on 12/26   Relatively controlled on 12/31 10. Diastolic congestive heart failure. Monitor for any signs of fluid overload. Weigh patient daily [facility] Weights   12/19/17 0421 12/21/17 0446 12/22/17 0555  Weight: 90.7 kg (199 lb 15.3 oz)  87.7 kg (193 lb 5.5 oz) 87.3 kg (192 lb 7.4 oz)    ?Reliability   Last Echo 2016, reordered, completed, relatively stable, with no significant changes 11. Hypothyroidism. Synthroid 12. Gout. Allopurinol 300 mg daily. Monitor for any gout flareups 13. Constipation. Laxative assistance 14. RUE lymphedema: see #3, continue elevation, massage, edema mgt 15. Hypoalbuminemia   Supplement initiated on 12/19 16. Acute blood loss anemia   Hemoglobin 9.6 on 12/28   Labs pending   Continue to monitor 17. 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 Hyponatremia: Resolved   Cont to monitor 20. AKI with diffuse edema   Cr 0.86 on 12/31   Appreciate hospitalist recs   Encourage fluids   Will need to be mindful of tumor lysis syndrome    Cont to monitor 21. Leukocytosis   UA equivocal, urine culture with multiple species   CXR reviewed, showing improvement  Cont to monitor 22. Thrombocytopenia:    Platelets 146 on 12/27   Labs pending   Continue to monitor 23. Postinfusion fluid overload   Lasix per hospitalist  24. Urinary retention with incontinence now   Foley inserted by hospitalist with plans for outpt follow up 25. Leukocytosis: Resolved   UA+, Ucx no growth   Empiric keflex d/ced  LOS (Days) 13 A TO FACE EVALUATION WAS PERFORMED  Colleen [name] 12/22/2017 9:23 AM

## 2017-12-23 ENCOUNTER — Inpatient Hospital Stay (HOSPITAL_COMMUNITY): Payer: Medicare Other | Admitting: Occupational Therapy

## 2017-12-23 ENCOUNTER — Inpatient Hospital Stay (HOSPITAL_COMMUNITY): Payer: Medicare Other | Admitting: Physical Therapy

## 2017-12-23 ENCOUNTER — Inpatient Hospital Stay (HOSPITAL_COMMUNITY): Payer: Medicare Other

## 2017-12-23 DIAGNOSIS — R609 Edema, unspecified: Secondary | ICD-10-CM

## 2017-12-23 LAB — PROTIME-INR
INR: 2.66
PROTHROMBIN TIME: 28.1 s — AB (ref 11.4–15.2)

## 2017-12-23 MED ORDER — WARFARIN SODIUM 2.5 MG PO TABS
2.5000 mg | ORAL_TABLET | Freq: Once | ORAL | Status: AC
  Administered 2017-12-23: 2.5 mg via ORAL
  Filled 2017-12-23: qty 1

## 2017-12-23 NOTE — Progress Notes (Signed)
[name] PHYSICAL MEDICINE & REHABILITATION     PROGRESS NOTE  Subjective/Complaints:  Pt seen lying in bed this AM.  She slept well overnight and feels better this AM.    ROS: Denies CP, nausea, vomiting, diarrhea.  Objective: Vital Signs: Blood pressure (!) 143/62, pulse 60, temperature 97.8 F (36.6 C), temperature source Oral, resp. rate 16, height 5\' 4"  (1.626 m), weight 84.3 kg (185 lb 13.6 oz), SpO2 93 %. No results found. No results for input(s): WBC, HGB, HCT, PLT in the last 72 hours. Recent Labs    12/22/17 0355 12/22/17 0911  NA 136 136  K 4.7 4.6  CL 95* 97*  GLUCOSE 101* 78  BUN 44* 42*  CREATININE 0.86 0.88  CALCIUM 10.5* 10.9*   CBG (last 3)  No results for input(s): GLUCAP in the last 72 hours.  Wt Readings from Last 3 Encounters:  12/23/17 84.3 kg (185 lb 13.6 oz)  12/08/17 88 kg (194 lb 0.1 oz)  11/28/17 73 kg (161 lb)    Physical Exam:  BP (!) 143/62 (BP Location: Left Arm)   Pulse 60   Temp 97.8 F (36.6 C) (Oral)   Resp 16   Ht 5\' 4"  (1.626 m)   Wt 84.3 kg (185 lb 13.6 oz)   SpO2 93%   BMI 31.90 kg/m  Constitutional: NAD. Obese  HENT: Normocephalic. Atraumatic Eyes: EOM are normal. No discharge.  Cardiovascular: RRR. No JVD.  Respiratory: No respiratory distress. Clear to auscultation.  GI: Bowel sounds are normal. She exhibits no distension.  Musculoskeletal: +Edema in all extremities >> LE Neurological: She isalertand oriented. Motor: RUE: Shoulder abduction 1/5, elbow flex/extension 2-/5, hand grip 3+/5 (hand grip improving) LUE: 4/5 proximal to distal B/L LE: HF 4/5, KE 4+/5, ADF/PF 4+/5.  Skin. Warm and dry Psychiatric: She has a normal mood and affect. Her behavior is normal. Judgment and thought content normal.   Assessment/Plan: 1. Functional deficits secondary to diffuse large B-cell non-Hodgkin's lymphoma which require 3+ hours per day of interdisciplinary therapy in a comprehensive inpatient rehab setting. Physiatrist  is providing close team supervision and 24 hour management of active medical problems listed below. Physiatrist and rehab team continue to assess barriers to discharge/monitor patient progress toward functional and medical goals.  Function:  Bathing Bathing position   Position: Bed  Bathing parts Body parts bathed by patient: Chest, Abdomen, Right upper leg, Left upper leg Body parts bathed by helper: Left lower leg, Right lower leg, Right arm, Left arm  Bathing assist Assist Level: (mod A)   Set up : To open containers, To obtain items  Upper Body Dressing/Undressing Upper body dressing   What is the patient wearing?: [place] over shirt/dress - Perfomed by helper: Thread/unthread right sleeve, Thread/unthread left sleeve, Put head through opening, Pull shirt over trunk        Upper body assist Assist Level: Set up      Lower Body Dressing/Undressing Lower body dressing   What is the patient wearing?: Non-skid slipper socks           Non-skid slipper socks- Performed by helper: Don/doff right sock, Don/doff left sock               TED Hose - Performed by helper: Don/doff left TED hose, Don/doff right TED hose  Lower body assist        Toileting Toileting Toileting activity did not occur: No continent bowel/bladder event  Toileting steps completed by helper: Adjust clothing prior to toileting, Performs perineal hygiene, Adjust clothing after toileting    Toileting assist Assist level: Two helpers   Transfers Chair/bed transfer   Chair/bed transfer method: Stand pivot Chair/bed transfer assist level: Total assist (Pt < 25%) Chair/bed transfer assistive device: Mechanical lift Mechanical lift: Stedy   Locomotion Ambulation     Max distance: 4 ft Assist level: Touching or steadying assistance (Pt > 75%)   Wheelchair   Type: Manual Max wheelchair distance: (10 ft in room) Assist Level: Moderate assistance (Pt 50 - 74%)   Cognition Comprehension Comprehension assist level: Understands basic 90% of the time/cues < 10% of the time  Expression Expression assist level: Expresses basic needs/ideas: With no assist  Social Interaction Social Interaction assist level: Interacts appropriately 90% of the time - Needs monitoring or encouragement for participation or interaction.  Problem Solving Problem solving assist level: Solves basic 75 - 89% of the time/requires cueing 10 - 24% of the time  Memory Memory assist level: Recognizes or recalls 75 - 89% of the time/requires cueing 10 - 24% of the time     Medical Problem List and Plan: 1.  Decreased functional mobility secondary to diffuse large B-cell non-Hodgkin's lymphoma.   Continue CIR    Per Heme/Onc, notes reviewed, plan to hold Rituximab, second round of steroids on 12/28.   Plan CHOP with infusional doxorubicin as an outpatient per Heme/Onc 2.  DVT Prophylaxis/Anticoagulation:  Monitor for any bleeding episodes   Coumadin therapeutic on 1/1 3. Pain Management with brachial plexus impingement due to lymphadenopathy             Neurontin 300 mg 3 times a day, oxycodone as needed 4. Mood: Provide emotional support 5. Neuropsych: This patient is capable of making decisions on her own behalf. 6. Skin/Wound Care: Routine skin checks 7. Fluids/Electrolytes/Nutrition: Routine I&O's  8. Atrial fibrillation. Continue Tambocor 100 mg twice a day, Lopressor 12.5 mg twice a day   Controlled on 12/31 9. Hypertension. Monitor with increased mobility   Lisinopril 20 mg daily d/ced on 12/26   Relatively controlled on 1/1 10. Diastolic congestive heart failure. Monitor for any signs of fluid overload. Weigh patient daily Filed Weights   12/21/17 0446 12/22/17 0555 12/23/17 0500  Weight: 87.7 kg (193 lb 5.5 oz) 87.3 kg (192 lb 7.4 oz) 84.3 kg (185 lb 13.6 oz)    ?Reliability   Last Echo 2016, reordered, completed, relatively stable, with no significant changes 11.  Hypothyroidism. Synthroid 12. Gout. Allopurinol 300 mg daily. Monitor for any gout flareups 13. Constipation. Laxative assistance 14. RUE lymphedema: see #3, continue elevation, massage, edema mgt 15. Hypoalbuminemia   Supplement initiated on 12/19 16. Acute blood loss anemia   Hemoglobin 9.6 on 12/28   Labs pending   Continue to monitor 17. 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 Hyponatremia: Resolved   Cont to monitor 20. AKI    Cr 0.86 on 12/31   Labs pending   Hospitalist signed off   Encourage fluids   Will need to be mindful of tumor lysis syndrome    Cont to monitor 21. Leukocytosis   UA equivocal, urine culture with multiple species   CXR reviewed, showing improvement   Cont to monitor 22. Thrombocytopenia:    Platelets 146 on 12/27  Labs pending   Continue to monitor 23. Postinfusion fluid overload   UE edema has significantly decreased, but LE edema persists   Lasix per hospitalist, increased to BID   LE dopplers ordered pending   Lasix increased to BID   Will consider CT abd/pelvis 24. Urinary retention with incontinence now   Foley inserted by hospitalist with plans for outpt follow up 25. Leukocytosis: Resolved   UA+, Ucx no growth   Empiric keflex d/ced  LOS (Days) 14 A TO FACE EVALUATION WAS PERFORMED  Colleen [name] 12/23/2017 8:43 AM

## 2017-12-23 NOTE — Progress Notes (Signed)
[facility] for Warfarin Indication: atrial fibrillation  No Known Allergies  Patient Measurements: Height: (mistaken entry) Weight: 185 lb 13.6 oz (84.3 kg) IBW/kg (Calculated) : 54.7   Vital Signs: Temp: 97.8 F (36.6 C) (01/01 0605) Temp Source: Oral (01/01 0605) BP: 143/62 (01/01 0605) Pulse Rate: 60 (01/01 0605)  Labs: Recent Labs    12/21/17 0404 12/22/17 0355 12/22/17 0911 12/23/17 0430  LABPROT 21.0* 24.7*  --  28.1*  INR 1.83 2.25  --  2.66  CREATININE 1.00 0.86 0.88  --    Estimated Creatinine Clearance: 53.5 mL/min (by C-G formula based on SCr of 0.88 mg/dL).  Assessment: 83 y/o female admitted with lymphadenopathy requiring biopsy - diagnosed with diffuse large B-cell non Hodgkin's Lymphoma. She is on chronic warfarin for history of afib. Warfarin had been on hold for porta cath placement, now s/p R EJ port catheter placement 12/26 and warfarin restarted 12/26. INR trending up 1.83 > 2.25>2.66 this morning.  PO intake variable, 50-75% of meals eaten. Drug interactions include daily prednisone.   PTA warfarin dose: 2.5mg  daily except 5mg  MF (INR 1.91 on 12/7)   Goal of Therapy:  INR 2-3 Monitor platelets by anticoagulation protocol: Yes   Plan:  Warfarin 2.5 mg PO x1 Daily INR Monitor for s/sx bleeding  Albertina Parr, PharmD., BCPS Clinical Pharmacist Pager [phone]

## 2017-12-23 NOTE — Progress Notes (Signed)
Bilateral lower extremity venous duplex has been completed. Negative for obvious evidence of DVT.  12/23/17 1:33 PM Carlos Levering RVT

## 2017-12-23 NOTE — Progress Notes (Signed)
Physical Therapy Session Note  Patient Details  Name: Colleen [name] MRN: [id number] Date of Birth: [birth year]  Today's Date: 12/23/2017 PT Individual Time: 0915-1010 PT Individual Time Calculation (min): 55 min   Short Term Goals: Week 2:  PT Short Term Goal 1 (Week 2): Pt will transfer sit<>stand w/ max assist x1 using LRAD. PT Short Term Goal 2 (Week 2): Pt will tolerate sitting up in recliner or w/c in between all therapies during duration of 1 day.  PT Short Term Goal 3 (Week 2): Pt will perform sit<>supine transfer w/ mod assist.  PT Short Term Goal 4 (Week 2): Pt will maintain static sitting balance w/o back support for 2 min w/ max assist.   Skilled Therapeutic Interventions/Progress Updates:   Pt supine upon arrival and agreeable to therapy, no c/o pain. NT present performing nursing care w/ bed pan. PT assisted w/ rolling pt to R and L multiple times for pericare, verbal and tactile cues for technique, overall min-mod assist for rolling, heavy utilization of bed rails for rolling. Total assist to don new gown after pericare. Transferred to EOB w/ mod assist, manual facilitation for weight-shifting, verbal cues for log-roll technique and pushing up w/ LUE. Worked on static sitting balance /wo back support and posture for 4-5 min while sitting at EOB w/ LUE support. Min guard to close supervision for static sitting and verbal/visual cues for upright posture. Performed sit>stand in stedy w/ Mod assist from elevated surface w/ manual assistance for RUE support on stedy. Transferred to w/c via stedy. Worked on independence w/ locomotion 2nd half of session while self-propelling w/c in hallway, 33' w/ supervision, using LUE and BLEs. Verbal and visual cues for technique and increased time required to complete distance, however no rest breaks required and pt able to steer around obstacles. Returned to room in w/c, Total assist 2/2 fatigue. Provided w/ RUE arm tray to facilitate R shoulder  approximation during rest and to improve symmetry of upright posture/w/c positioning, pt tends to sit w/ L lateral slump while in w/c. Elevating leg rests donned and pt ended session in w/c and in care of husband, all needs met.   Therapy Documentation Precautions:  Precautions Precautions: Fall, Other (comment) Precaution Comments: Chemo; BP only in LE; incontinent with mobility; R UE pain/lymphedema Restrictions Weight Bearing Restrictions: No Pain: Pain Assessment Pain Assessment: No/denies pain(as long as she isnt moving)  See Function Navigator for Current Functional Status.   Therapy/Group: Individual Therapy  [name] K Arnette 12/23/2017, 11:51 AM

## 2017-12-23 NOTE — Progress Notes (Signed)
Occupational Therapy Session Note  Patient Details  Name: Colleen [name] MRN: [id number] Date of Birth: 01/09/1935  Today's Date: 12/23/2017 OT Individual Time: 1430-1530 OT Individual Time Calculation (min): 60 min    Short Term Goals: Week 1:  OT Short Term Goal 1 (Week 1): Pt will maintain dynamic sitting balance during selfcare tasks with no more than min assist when sitting unsupported. OT Short Term Goal 1 - Progress (Week 1): Not met OT Short Term Goal 2 (Week 1): Pt will complete UB bathing with min assist using LH sponge.  OT Short Term Goal 2 - Progress (Week 1): Not met OT Short Term Goal 3 (Week 1): Pt will perform LB bathing sit to stand with mod assist using AE PRN.  OT Short Term Goal 3 - Progress (Week 1): Not met OT Short Term Goal 4 (Week 1): Pt will toleerate AAROM of the RUE shoulder flexion 0-90 degrees for greater functional use with selfcare tasks.   OT Short Term Goal 4 - Progress (Week 1): Not met OT Short Term Goal 5 (Week 1): Pt will perform toilet transfer with mod assist stand pivot to the 3:1 OT Short Term Goal 5 - Progress (Week 1): Not met Week 2:  OT Short Term Goal 1 (Week 2): Pt will maintain static sitting during selfcare tasks with mod assist.  OT Short Term Goal 2 (Week 2): Pt will perform UB bathing with mod assist using AE PRN. OT Short Term Goal 3 (Week 2): Pt will perform stand pivot toilet transfer to the 3:1 with mod assist.  OT Short Term Goal 4 (Week 2): Pt will tolerate AAROM of the right shoulder 0-70 degrees for edema control and for greater use with selfcare tasks.    Skilled Therapeutic Interventions/Progress Updates:    Treatment session focused on edema management; bd mobility training, activity tolerance, and balance training. Upon entering pt resting in bed with husband and niece present and agreeable to OT session. Pt moved from HOB elevated/supine to EOb sitting with mod A for UB support with v/c for hand/foot placement. Pt tolerated  EOB unsupported sitting (feet supported) for 35 minutes during there activity and pt/caregiver education. Therapist performed light edema massage to R UE and gentle AAROm to wrist, elbow and shoulder as tolerated. Therapist provided instructions on self PROM/AAROM for pt to complete. Pt verbalized that she's been trying to do it on her own. Pt moved from EOB to supine lying in bed with max-total A for LB guiding. Pt repositioned on bed pan with mod-max A for bed mob and rolling. Pt left resting on bed pan with call bell in hand for nursing. NT notified and pts needs met.   Therapy Documentation Precautions:  Precautions Precautions: Fall, Other (comment) Precaution Comments: Chemo; BP only in LE; incontinent with mobility; R UE pain/lymphedema Restrictions Weight Bearing Restrictions: No   Pain: Pain Assessment Pain Score: 4  Pain Type: Acute pain Pain Location: Shoulder Pain Orientation: Right Pain Descriptors / Indicators: Aching Pain Onset: With Activity Patients Stated Pain Goal: 0 Pain Intervention(s): Repositioned Multiple Pain Sites: No See Function Navigator for Current Functional Status.   Therapy/Group: Individual Therapy  Molly  Willson 12/23/2017, 3:50 PM 

## 2017-12-23 NOTE — Progress Notes (Signed)
Occupational Therapy Note  Patient Details  Name: Colleen [name] MRN: [id number] Date of Birth: 07-28-1935  Today's Date: 12/23/2017 OT Individual Time: 1035-1105 OT Individual Time Calculation (min): 30 min   No pain at rest.     Pt seen this session to focus on RUE AROM and edema management. Pt resting in w/c with RUE on half lap tray.  She stated she wanted to focus on her arm this session.  Pt worked on a/arom of elbow flexion/extension, slight movement for shoulder flexion and ext with therapist supporting weight of her arm.  She also worked on actively retracting scapula.  Pt tolerated exercises well with no pain.  Pt had moderate edema in hand, provided retrograde massage to move fluid.  Educated pt on how to do self massage to hand and frequent hand squeezes as she now has 4-/5 grasp strength.   Positioned hand on lap tray with a rolled blanket to keep hand above elbow level.  Pt continuing to rest in w/c with spouse in room with her.   Clio 12/23/2017, 12:17 PM

## 2017-12-23 NOTE — Plan of Care (Signed)
  Progressing RH BOWEL ELIMINATION RH STG MANAGE BOWEL WITH ASSISTANCE Description STG Manage Bowel with Total Assistance.   12/23/2017 1154 - Progressing by Brita Romp, RN RH BLADDER ELIMINATION RH STG MANAGE BLADDER WITH ASSISTANCE Description STG Manage Bladder With Total Assistance   12/23/2017 1154 - Progressing by Brita Romp, RN RH STG MANAGE BLADDER WITH EQUIPMENT WITH ASSISTANCE Description STG Manage Bladder With Equipment With Max Assistance   12/23/2017 1154 - Progressing by Brita Romp, RN RH SKIN INTEGRITY RH STG SKIN FREE OF INFECTION/BREAKDOWN Description Patient will be free of new breakdown prior to discharge  12/23/2017 1154 - Progressing by Brita Romp, RN RH STG MAINTAIN SKIN INTEGRITY WITH ASSISTANCE Description STG Maintain Skin Integrity With Max Assistance.   12/23/2017 1154 - Progressing by Brita Romp, RN RH STG ABLE TO PERFORM INCISION/WOUND CARE W/ASSISTANCE Description STG Able To Perform Incision/Wound Care With Total Assistance.   12/23/2017 1154 - Progressing by Brita Romp, RN RH SAFETY RH STG ADHERE TO SAFETY PRECAUTIONS W/ASSISTANCE/DEVICE Description STG Adhere to Safety Precautions With  Mod I Assistance/Device.  12/23/2017 1154 - Progressing by Brita Romp, RN RH PAIN MANAGEMENT RH STG PAIN MANAGED AT OR BELOW PT'S PAIN GOAL Description Less than 4  12/23/2017 1154 - Progressing by Brita Romp, RN RH KNOWLEDGE DEFICIT GENERAL RH STG INCREASE KNOWLEDGE OF SELF CARE AFTER HOSPITALIZATION Description Pt and family will demonstrate increased knowledge of self-care at discharge with Mod I assistance  12/23/2017 1154 - Progressing by Brita Romp, RN

## 2017-12-24 ENCOUNTER — Inpatient Hospital Stay (HOSPITAL_COMMUNITY): Payer: Medicare Other

## 2017-12-24 ENCOUNTER — Encounter (HOSPITAL_COMMUNITY): Payer: Medicare Other | Admitting: Psychology

## 2017-12-24 ENCOUNTER — Other Ambulatory Visit: Payer: Self-pay | Admitting: Oncology

## 2017-12-24 ENCOUNTER — Inpatient Hospital Stay (HOSPITAL_COMMUNITY): Payer: Medicare Other | Admitting: Occupational Therapy

## 2017-12-24 ENCOUNTER — Other Ambulatory Visit: Payer: Medicare Other

## 2017-12-24 ENCOUNTER — Ambulatory Visit: Payer: Medicare Other | Admitting: Oncology

## 2017-12-24 LAB — PROTIME-INR
INR: 3.17
PROTHROMBIN TIME: 32.3 s — AB (ref 11.4–15.2)

## 2017-12-24 MED ORDER — EPINEPHRINE PF 1 MG/ML IJ SOLN: 0.5000 mg | Freq: Once | INTRAMUSCULAR | Status: DC | PRN

## 2017-12-24 MED ORDER — HEPARIN SOD (PORK) LOCK FLUSH 100 UNIT/ML IV SOLN
500.0000 [IU] | Freq: Once | INTRAVENOUS | Status: AC | PRN
  Administered 2017-12-25: 500 [IU]
  Filled 2017-12-24: qty 5

## 2017-12-24 MED ORDER — DIPHENHYDRAMINE HCL 50 MG/ML IJ SOLN: 50.0000 mg | Freq: Once | INTRAMUSCULAR | Status: DC | PRN

## 2017-12-24 MED ORDER — SODIUM CHLORIDE 0.9 % IV SOLN: Freq: Once | INTRAVENOUS | Status: DC | PRN

## 2017-12-24 MED ORDER — FAMOTIDINE IN NACL 20-0.9 MG/50ML-% IV SOLN
20.0000 mg | Freq: Once | INTRAVENOUS | Status: DC | PRN
  Filled 2017-12-24: qty 50

## 2017-12-24 MED ORDER — ACETAMINOPHEN 325 MG PO TABS
650.0000 mg | ORAL_TABLET | Freq: Once | ORAL | Status: AC
  Administered 2017-12-25: 650 mg via ORAL
  Filled 2017-12-24: qty 2

## 2017-12-24 MED ORDER — SODIUM CHLORIDE 0.9% FLUSH: 3.0000 mL | INTRAVENOUS | Status: DC | PRN

## 2017-12-24 MED ORDER — SODIUM CHLORIDE 0.9 % IV SOLN
20.0000 mg | Freq: Once | INTRAVENOUS | Status: AC
  Administered 2017-12-25: 20 mg via INTRAVENOUS
  Filled 2017-12-24: qty 2

## 2017-12-24 MED ORDER — METHYLPREDNISOLONE SODIUM SUCC 125 MG IJ SOLR
125.0000 mg | Freq: Once | INTRAMUSCULAR | Status: DC | PRN
  Filled 2017-12-24: qty 2

## 2017-12-24 MED ORDER — HEPARIN SOD (PORK) LOCK FLUSH 100 UNIT/ML IV SOLN
250.0000 [IU] | Freq: Once | INTRAVENOUS | Status: DC | PRN
  Filled 2017-12-24: qty 3

## 2017-12-24 MED ORDER — ALBUTEROL SULFATE (2.5 MG/3ML) 0.083% IN NEBU: 2.5000 mg | INHALATION_SOLUTION | Freq: Once | RESPIRATORY_TRACT | Status: DC | PRN

## 2017-12-24 MED ORDER — DIPHENHYDRAMINE HCL 50 MG/ML IJ SOLN: 25.0000 mg | Freq: Once | INTRAMUSCULAR | Status: DC | PRN

## 2017-12-24 MED ORDER — DIPHENHYDRAMINE HCL 25 MG PO CAPS
25.0000 mg | ORAL_CAPSULE | Freq: Once | ORAL | Status: AC
  Administered 2017-12-25: 25 mg via ORAL
  Filled 2017-12-24: qty 1

## 2017-12-24 MED ORDER — EPINEPHRINE PF 1 MG/10ML IJ SOSY: 0.2500 mg | PREFILLED_SYRINGE | Freq: Once | INTRAMUSCULAR | Status: DC | PRN

## 2017-12-24 MED ORDER — ALTEPLASE 2 MG IJ SOLR
2.0000 mg | Freq: Once | INTRAMUSCULAR | Status: DC | PRN
  Filled 2017-12-24: qty 2

## 2017-12-24 MED ORDER — WARFARIN SODIUM 1 MG PO TABS
1.0000 mg | ORAL_TABLET | Freq: Once | ORAL | Status: AC
  Administered 2017-12-24: 1 mg via ORAL
  Filled 2017-12-24: qty 1

## 2017-12-24 MED ORDER — SODIUM CHLORIDE 0.9 % IV SOLN: Freq: Once | INTRAVENOUS | Status: AC

## 2017-12-24 MED ORDER — SODIUM CHLORIDE 0.9% FLUSH: 10.0000 mL | INTRAVENOUS | Status: DC | PRN

## 2017-12-24 MED ORDER — SODIUM CHLORIDE 0.9 % IV SOLN
375.0000 mg/m2 | Freq: Once | INTRAVENOUS | Status: AC
  Administered 2017-12-25: 800 mg via INTRAVENOUS
  Filled 2017-12-24: qty 80

## 2017-12-24 NOTE — Progress Notes (Addendum)
Nutrition Brief Note  Patient identified with a low braden score.   Wt Readings from Last 15 Encounters:  12/24/17 188 lb 4.4 oz (85.4 kg)  12/08/17 194 lb 0.1 oz (88 kg)  11/28/17 161 lb (73 kg)  11/14/17 164 lb (74.4 kg)  10/01/17 164 lb (74.4 kg)  04/09/17 165 lb (74.8 kg)  02/06/17 168 lb (76.2 kg)  01/23/17 170 lb (77.1 kg)  01/16/17 168 lb (76.2 kg)  12/20/16 173 lb (78.5 kg)  09/09/16 173 lb (78.5 kg)  08/14/16 172 lb (78 kg)  04/19/16 173 lb (78.5 kg)  03/14/16 171 lb (77.6 kg)  01/29/16 172 lb (78 kg)    Body mass index is 32.32 kg/m. Patient meets criteria for class I based on current BMI. Pt with no significant weight loss.  Current diet order is heart healthy, patient is consuming approximately 100% of meals at this time. Pt reports decreased appetite however usually consumes at least 3 meals a day to ensure adequate nutrition. Labs and medications reviewed. Pt currently has Prostat ordered and has been consuming them.   No further nutrition interventions warranted at this time. If nutrition issues arise, please consult RD.   Corrin Parker, MS, RD, LDN Pager # [phone] After hours/ weekend pager # [phone]

## 2017-12-24 NOTE — Consult Note (Signed)
Neuropsychological Consultation   Patient:   Colleen [name]   DOB:   08/05/1935  MR Number:  [id number]  Location:  [place] [address] [facility] [address] [id number] [place] [address] Dept: [place]: [phone]           Date of Service:   12/24/2017  Start Time:   1 PM End Time:   2 PM  Provider/Observer:  Ilean Skill, Psy.D.       Clinical Neuropsychologist       Billing Code/Service: [phone] 4 Units  Chief Complaint:    Colleen [name] is an 83 year old female with history of Afib, congestive heart failure, hypertension, and TIA.  Presented on 11/29/2017 with right arm pain and swelling as well as mass in her neck.  Lymph node diopsy showed likely diffuse large B-cell non-Hodgkin's lymphoma.  Patient being treated for this and has started CIR programe for debility.  Patient dealing with new dx of lymphoma.  Reason for Service:  Colleen [name] was referred for neuropsychological/psychological consultation due to coping and adjustment issues with recent dx of large B-cell non-Hodgkin's lymphoma.  Below is the HPI for the current admission.  HPI: Colleen Liberati Cooperis a 83 y.o.right handed femalewith history of atrial fibrillation with chronic Coumadin, diastolic congestive heart failure, hypertension, TIA and remote tobacco abuse.History taken from chart review and patient.Per report patient lives with spouseand son. Independent prior to admission. Multilevel home bath and bedroom upstairs. Spouse with limited physical assistanceand spouse andson work during the day. Presented 12/08/2018withcomplaints of right arm pain and swelling as well as recent reports of a mass in her right neck. CT of the chest showed significant adenopathy bilaterally. MRI soft tissue of the neck showed bulky cervical and included chest lymphadenopathy with right brachial plexus impingement. Lymph node biopsy 12/02/2017 showed likely  diffuse large B-cell non-Hodgkin's lymphoma. Hematology consulted/Dr. Magrinat placed on prednisone/allopurinol as well as [facility] 12/04/2017. Plan is to receive [facility] with infusional doxorubicin 12/20 per hematology/oncology services Chronic Coumadin has been resumed. Physical and occupational therapy evaluations completed with recommendations of physical medicine rehabilitation consult.Patient was admitted for a comprehensive rehabilitation program  Current Status:  The patient reports that she has struggled with strength impacting her ability to stand and transfer etc.  Her right arm pain has improved but pain still negatively impacting her sleep.  The patient reports that initially she had trouble with dx of lymphoma and feared what was happening.  Her mother died from this in 44 after 10 year battle.  However, she reports that as she talks with oncologist, that she is feeling better about ability to effectively treat condition.  She reports that she is motivated now to do rehab and regain strength for return home.   Behavioral Observation: Colleen [name]  presents as a 83 y.o.-year-old Right handed Caucasian Female who appeared her stated age. her dress was Appropriate and she was Well Groomed and her manners were Appropriate to the situation.  her participation was indicative of Appropriate and Attentive behaviors.  There were physical disabilities noted related to right arm swelling and post intervention as well as overall weakness and dibility.  she displayed an appropriate level of cooperation and motivation.     Interactions:    Active Appropriate and Attentive  Attention:   within normal limits and attention span and concentration were age appropriate  Memory:   within normal limits; recent and remote memory intact  Visuo-spatial:  not examined  Speech (Volume):  normal  Speech:   normal;   Thought Process:  Coherent and Relevant  Though Content:  WNL; not  suicidal  Orientation:   person, place, time/date and situation  Judgment:   Good  Planning:   Good  Affect:    Appropriate  Mood:    Anxious  Insight:   Good  Intelligence:   normal  Medical History:   Past Medical History:  Diagnosis Date  . Atrial fibrillation ([place])   . Carotid artery disease ([place])   . CHF (congestive heart failure) ([place])   . Hyperlipidemia   . Hypertension   . Hypothyroidism   . Shortness of breath   . TIA (transient ischemic attack) 2001        Psychiatric History:  No prior psychiatric history  Family Med/Psych History:  Family History  Problem Relation Age of Onset  . Heart failure Mother   . Parkinson's disease Mother   . Arthritis Mother   . Arthritis Sister   . Colon cancer Brother   . Rheumatologic disease Neg Hx   . Lung disease Neg Hx     Risk of Suicide/Violence: virtually non-existent   Impression/DX:  Colleen [name] is an 83 year old female with history of Afib, congestive heart failure, hypertension, and TIA.  Presented on 11/29/2017 with right arm pain and swelling as well as mass in her neck.  Lymph node diopsy showed likely diffuse large B-cell non-Hodgkin's lymphoma.  Patient being treated for this and has started CIR programe for debility.  Patient dealing with new dx of lymphoma.    The patient reports that she has struggled with strength impacting her ability to stand and transfer etc.  Her right arm pain has improved but pain still negatively impacting her sleep.  The patient reports that initially she had trouble with dx of lymphoma and feared what was happening.  Her mother died from this in 58 after 10 year battle.  However, she reports that as she talks with oncologist, that she is feeling better about ability to effectively treat condition.  She reports that she is motivated now to do rehab and regain strength for return home.   Diagnosis:            Electronically Signed   _______________________ Ilean Skill,  Psy.D.

## 2017-12-24 NOTE — Progress Notes (Signed)
Physical Therapy Session Note  Patient Details  Name: Colleen [name] MRN: [id number] Date of Birth: 05-Aug-1935  Today's Date: 12/24/2017 PT Individual Time: 0802-0900, 1500-1530 PT Individual Time Calculation (min): 58 min , 30 min  Short Term Goals: Week 2:  PT Short Term Goal 1 (Week 2): Pt will transfer sit<>stand w/ max assist x1 using LRAD. PT Short Term Goal 2 (Week 2): Pt will tolerate sitting up in recliner or w/c in between all therapies during duration of 1 day.  PT Short Term Goal 3 (Week 2): Pt will perform sit<>supine transfer w/ mod assist.  PT Short Term Goal 4 (Week 2): Pt will maintain static sitting balance w/o back support for 2 min w/ max assist.   Skilled Therapeutic Interventions/Progress Updates:    Session 1: Pt supine in bed upon PT arrival, agreeable to therapy tx and reports pain 7/10 in R arm. Pt transferred from supine>sitting EOB with max assist an dincreased time to complete, verbal cues for techniques. Pt performed x 3 sit<>stands from elevated bed height within the stedy with max assist, verbal and tactile cues for hip/knee extension. Pt transferred from bed>w/c using the stedy, total assist. Pt transported from room>gym total assist for energy conservation. Pt performed sit<>stand using the standing frame, pt able to tolerate standing x 5 minutes while working on isometric hip/knee extension. Pt propelled 50 ft part way back to her room with min assist, using B LEs and L UE,limited by fatigue and endurance. Pt transferred back to bed using the stedy, max assist to stand and total assist for transfer. Pt transferred sitting>supine with max assist. Pt performed rolling to the L with mod assist in order to place bedpan. Pt left supine in bed in the care of NT.   Session 2: Pt seated in w/c upon PT arrival, denies pain at rest. Pt reports feeling exhausted and requesting to get back in bed. Pt attempted to perform sit<>stand within stedy but unable to perform with max  assist +1. Pt requiring 2 helpers, total assist to perform sit<>Stand within the stedy and transferred back to bed total assist. Pt transferred sitting>supine with total assist, left supine in bed with needs in reach.   Therapy Documentation Precautions:  Precautions Precautions: Fall, Other (comment) Precaution Comments: Chemo; BP only in LE; incontinent with mobility; R UE pain/lymphedema Restrictions Weight Bearing Restrictions: No   See Function Navigator for Current Functional Status.   Therapy/Group: Individual Therapy  Netta Corrigan, PT, DPT 12/24/2017, 7:46 AM

## 2017-12-24 NOTE — Progress Notes (Signed)
Colleen [name]   DOB:05/26/1935   PF#:[id number]   ZHG#:[id number]  Subjective: Colleen [name] tells me the swelling in her Right arm is down, though she still can't use it. She c/o ankle swelling bilaterally. She had no problems with the prednisone. She is able to spend longer time in wc and to get around better in wc. Looking to d/c to a Rehab facility. No famiy in room  Objective: Elderly white woman examined in bed Vitals:   12/23/17 2009 12/24/17 0410  BP: (!) 110/52 (!) 113/41  Pulse: 64 63  Resp:  18  Temp:  97.9 F (36.6 C)  SpO2:  94%    Body mass index is 32.32 kg/m.  Intake/Output Summary (Last 24 hours) at 12/24/2017 0653 Last data filed at 12/24/2017 0400 Gross per 24 hour  Intake 920 ml  Output 2100 ml  Net -1180 ml    Sclerae unicteric, EOMs intact Oropharynx clear, slightly dry R Parkline adenopathy appears flatter Lungs no rales or rhonchi, auscultated anterolaterally Heart regular rate and rhythm Abd soft, nontender, positive bowel sounds MSK RUE lymphedema as previously noted; moderate bruising RUE Neuro: nonfocal, well oriented, positive affect Breasts: Deferred   CBG (last 3)  No results for input(s): GLUCAP in the last 72 hours.   Labs:  Lab Results  Component Value Date   WBC 3.8 (L) 12/20/2017   HGB 8.9 (L) 12/20/2017   HCT 27.4 (L) 12/20/2017   MCV 108.3 (H) 12/20/2017   PLT 191 12/20/2017   NEUTROABS 3.1 12/20/2017    @LASTCHEMISTRY @  Urine Studies No results for input(s): UHGB, CRYS in the last 72 hours.  Invalid input(s): UACOL, UAPR, USPG, UPH, UTP, UGL, UKET, UBIL, UNIT, UROB, Kathleen, UEPI, UWBC, Pamala Duff[place], [place]  Basic Metabolic Panel: Recent Labs  Lab 12/19/17 0944 12/20/17 0428 12/21/17 0404 12/22/17 0355 12/22/17 0911  NA 138 135 135 136 136  K 5.2* 4.9 5.0 4.7 4.6  CL 97* 98* 97* 95* 97*  CO2 27 26 27 28 28   GLUCOSE 71 98 113* 101* 78  BUN 70* 69* 55* 44* 42*  CREATININE 1.64* 1.40* 1.00 0.86 0.88  CALCIUM 10.9* 10.7*  10.4* 10.5* 10.9*   GFR Estimated Creatinine Clearance: 53.9 mL/min (by C-G formula based on SCr of 0.88 mg/dL). Liver Function Tests: Recent Labs  Lab 12/18/17 0538 12/20/17 0428  AST 46* 43*  ALT 23 17  ALKPHOS 88 86  BILITOT 0.9 0.7  PROT 4.8* 4.8*  ALBUMIN 2.1* 2.1*   No results for input(s): LIPASE, AMYLASE in the last 168 hours. No results for input(s): AMMONIA in the last 168 hours. Coagulation profile Recent Labs  Lab 12/20/17 0428 12/21/17 0404 12/22/17 0355 12/23/17 0430 12/24/17 0414  INR 1.75 1.83 2.25 2.66 3.17    CBC: Recent Labs  Lab 12/18/17 0538 12/19/17 0414 12/20/17 0428  WBC 6.2  7.8 7.3 3.8*  NEUTROABS 5.1  --  3.1  HGB 11.8*  9.5* 9.6* 8.9*  HCT 37.1  29.6* 30.2* 27.4*  MCV 108.2*  108.4* 110.6* 108.3*  PLT 146*  178 196 191   Cardiac Enzymes: No results for input(s): CKTOTAL, CKMB, CKMBINDEX, TROPONINI in the last 168 hours. BNP: Invalid input(s): POCBNP CBG: No results for input(s): GLUCAP in the last 168 hours. D-Dimer No results for input(s): DDIMER in the last 72 hours. Hgb A1c No results for input(s): HGBA1C in the last 72 hours. Lipid Profile No results for input(s): CHOL, HDL, LDLCALC, TRIG, CHOLHDL, LDLDIRECT in the last 72  hours. Thyroid function studies No results for input(s): TSH, T4TOTAL, T3FREE, THYROIDAB in the last 72 hours.  Invalid input(s): FREET3 Anemia work up No results for input(s): VITAMINB12, FOLATE, FERRITIN, TIBC, IRON, RETICCTPCT in the last 72 hours. Microbiology Recent Results (from the past 240 hour(s))  Urine Culture     Status: None   Collection Time: 12/16/17  3:04 PM  Result Value Ref Range Status   Specimen Description URINE, CLEAN CATCH  Final   Special Requests NONE  Final   Culture NO GROWTH  Final   Report Status 12/17/2017 FINAL  Final  Surgical PCR screen     Status: None   Collection Time: 12/17/17  5:08 AM  Result Value Ref Range Status   MRSA, PCR NEGATIVE NEGATIVE Final    Staphylococcus aureus NEGATIVE NEGATIVE Final    Comment: (NOTE) The Xpert SA Assay (FDA approved for NASAL specimens in patients 79 years of age and older), is one component of a comprehensive surveillance program. It is not intended to diagnose infection nor to guide or monitor treatment.       Studies:  Dg Chest 2 View  Result Date: 12/12/2017 CLINICAL DATA:  Follow-up evaluation for leukocytosis. Right arm swelling. History of CHF, hypertension, TIA, coronary artery disease, atrial fibrillation. EXAM: CHEST  2 VIEW COMPARISON:  Chest x-rays dated 12/11/2017 and 08/14/2015. Chest CT dated 11/29/2017. FINDINGS: Improved aeration within the right upper lung compared to yesterday's chest x-ray suggesting improved fluid status. No new lung findings. Cardiomediastinal silhouette is stable in size and configuration compared to yesterday's exam, compatible with the extensive mediastinal lymphadenopathy demonstrated on recent CT. Lateral view is suboptimal all. I cannot exclude small pleural effusion on the lateral view. IMPRESSION: 1. Improved aeration within the right upper lobe compared to yesterday's chest x-ray, likely indicating improved fluid status. Lungs otherwise clear. Questionable pleural effusion on the lateral projection. 2. Abnormal configuration of the cardiomediastinal silhouette, compatible with the extensive mediastinal and perihilar lymphadenopathy better demonstrated on recent CT chest of 11/29/2017. Electronically Signed   By: Franki Cabot M.D.   On: 12/12/2017 16:02   Ct Soft Tissue Neck Wo Contrast  Result Date: 11/29/2017 CLINICAL DATA:  83 y/o F; patient undergoing evaluation for lymphadenopathy with biopsy in the left subclavian area straightening, now presenting with swelling of the left arm and chest with associated severe pain. EXAM: CT NECK WITHOUT CONTRAST TECHNIQUE: Multidetector CT imaging of the neck was performed following the standard protocol without intravenous  contrast. COMPARISON:  11/24/2017 CT of the neck. FINDINGS: Pharynx and larynx: Normal. No mass or swelling. Salivary glands: No inflammation, mass, or stone. Thyroid: Normal. Lymph nodes: Interval progression of diffuse lymphadenopathy, for example a left level 5B lymph node measures 12 x 12 mm, previously 6 mm (series 3, image 88). Increased lymphadenopathy is best appreciated left posterior cervical chain and left upper axilla but increase in adenopathy is present diffusely, for example a right upper peritracheal node measuring 24 x 31 mm, previously 17 x 23 mm (series 3, image 121). Fat stranding surrounding conglomerate lymph nodes throughout the right cervical chain, right greater than left supraclavicular regions, and left axilla may represent infiltrative neoplasm or lymphedema. Vascular: Calcific atherosclerosis of the aorta and carotid siphons. Limited intracranial: Negative. Visualized orbits: Negative. Mastoids and visualized paranasal sinuses: Small left maxillary sinus mucous retention cyst. Otherwise negative. Skeleton: Stable mild cervical spondylosis. No high-grade bony canal stenosis. Upper chest: Emphysema and scarring in bilateral upper lobes is stable. Other: None. IMPRESSION: Interval  progression of lymphadenopathy from 11/24/2017 best appreciated in the left cervical level 5B station, but measurable increase is present diffusely. Findings indicate rapidly progressive lymphoproliferative or metastatic disease. Correlation with biopsy is recommended. Electronically Signed   By: Kristine Garbe M.D.   On: 11/29/2017 22:54   Ct Soft Tissue Neck W Contrast  Result Date: 11/24/2017 CLINICAL DATA:  RIGHT neck and ear pain, enlarging RIGHT supraclavicular and retroauricular mass for 2 weeks. LEFT facial swelling. History of carotid artery surgery. EXAM: CT NECK WITH CONTRAST TECHNIQUE: Multidetector CT imaging of the neck was performed using the standard protocol following the bolus  administration of intravenous contrast. CONTRAST:  52mL ISOVUE-300 IOPAMIDOL (ISOVUE-300) INJECTION 61% COMPARISON:  CT chest September 11, 2015 FINDINGS: PHARYNX AND LARYNX: Normal.  Widely patent airway. SALIVARY GLANDS: Normal. THYROID: Normal. LYMPH NODES: Multilevel severe RIGHT cervical lymphadenopathy. 27 mm RIGHT level 1 B lymph node, round RIGHT level IIa 23 mm lymph node, 3.1 cm RIGHT supraclavicular lymph node with pericapsular fat stranding RIGHT neck lymphadenopathy. Mild LEFT neck lymphadenopathy including 11 mm intraparotid lymph nodes and 14 mm LEFT level IIa lymph node. VASCULAR: Normal. LIMITED INTRACRANIAL: Normal. VISUALIZED ORBITS: Surgical clips LEFT neck most compatible with endarterectomy. Severe calcific atherosclerosis RIGHT carotid bifurcation. Lymphadenopathy narrows RIGHT internal jugular vein which remains patent. MASTOIDS AND VISUALIZED PARANASAL SINUSES: Well-aerated. SKELETON: Nonacute. No destructive bony lesions. Patient is edentulous. UPPER CHEST: RIGHT apical bullous changes and scarring. Partially imaged bilateral hilar lymphadenopathy. Mediastinal lymphadenopathy including 2 cm aortopulmonary window lymph node, 17 mm pretracheal lymph node. LEFT greater than RIGHT axillary lymphadenopathy, at least 4 cm nodal conglomeration with LEFT retropectoral infiltrative mass. Heart size is probably enlarged. Severe coronary artery calcifications. OTHER: IMPRESSION: 1. Severe RIGHT and mild LEFT neck lymphadenopathy. Severe LEFT axillary lymphadenopathy with lymph edema versus lymphangitic spread of tumor. LEFT retropectoral infiltrative mass. Mediastinal lymphadenopathy. Constellation of findings seen with lymphoproliferative disease, metastatic disease such as breast cancer given LEFT chest findings. Recommend CT chest with contrast versus PET-CT. 2. These results will be called to the ordering clinician or representative by the Radiologist Assistant, and communication documented in the  PACS or zVision Dashboard. Aortic Atherosclerosis (ICD10-I70.0). Electronically Signed   By: Elon Alas M.D.   On: 11/24/2017 20:19   Ct Chest W Contrast  Result Date: 11/29/2017 CLINICAL DATA:  Left-sided chest pain and swelling, recent biopsy on right supraclavicular lymph nodes EXAM: CT CHEST WITH CONTRAST TECHNIQUE: Multidetector CT imaging of the chest was performed during intravenous contrast administration. CONTRAST:  60mL ISOVUE-300 IOPAMIDOL (ISOVUE-300) INJECTION 61% COMPARISON:  11/24/2017 FINDINGS: Cardiovascular: Atherosclerotic changes of the thoracic aorta are noted without aneurysmal dilatation or dissection. Coronary calcifications are seen. No pulmonary emboli are noted. Some attenuation of the pulmonary arterial branches is noted secondary to hilar adenopathy. Additionally some displacement of the vasculature in the neck is noted particularly on the right also related to adenopathy. Mediastinum/Nodes: Thoracic inlet demonstrates evidence of significant right supraclavicular lymphadenopathy. The largest nodal mass measures approximately 10 by 4.9 cm and causes displacement of the adjacent vascular structures in the right neck. Additional adenopathy is noted in the anterior neck similar to that seen on prior examination. Significant mediastinal and hilar adenopathy is identified. The largest of these in the right hilum measures 2.3 cm in short axis. Subcarinal lymphadenopathy is noted measuring 16 mm in short axis. Prevascular adenopathy is noted measuring 2.2 cm in short axis. The esophagus is within normal limits. Considerable adenopathy is noted in the posterior mediastinum surrounding the  descending aorta. The nodal mass measures approximately 2.9 by 3.9 cm. Considerable right axillary adenopathy is noted extending along the lateral chest wall. Lungs/Pleura: Emphysematous changes are noted in the lungs bilaterally. Mild scarring is seen. No focal parenchymal mass is noted. No sizable  effusion is seen. Upper Abdomen: Gallbladder has been surgically removed. The liver is within normal limits. The adrenal glands and visualized spleen are unremarkable. Musculoskeletal: T9 compression deformity is noted. Some lucency is noted within. It would be difficult to exclude some metastatic involvement given the findings throughout the chest. Degenerative changes of the thoracic spine are seen. No rib abnormality is noted. Considerable soft tissue mass is noted in the left anterior chest wall involving the pectoral muscles and subpectoral region. The overall size of the mass measures approximately 10.5 by 6.7 cm and Ing gallstone the subclavian and axillary artery is and veins. Significant skin thickening and edema is noted within the left breast. It would be difficult to exclude a portion of this representing a breast mass. There is apparent involvement of the chest wall although no bony destruction is seen. IMPRESSION: Diffuse lymphadenopathy involving the supraclavicular regions, mediastinum, bilateral hila as well as the axillary regions bilaterally as described above. Correlation with the recent biopsy results is recommended. It would be difficult to exclude a portion of the abnormality in the left chest wall to be related to the overlying breast given the degree of skin thickening and edema identified inferiorly. Alternatively this may represent an aggressive lymphoma. Further workup with PET-CT is recommended. T9 compression deformity with some lucencies noted within. This may be strictly related to the compression deformity although the possibility of metastatic disease would deserve consideration as well. No other definitive bony abnormality is noted. Aortic Atherosclerosis (ICD10-I70.0) and Emphysema (ICD10-J43.9). Electronically Signed   By: Inez Catalina M.D.   On: 11/29/2017 20:01   US Renal  Result Date: 12/20/2017 CLINICAL DATA:  Acute onset of renal insufficiency. Initial encounter. EXAM:  RENAL / URINARY TRACT ULTRASOUND COMPLETE COMPARISON:  None. FINDINGS: Right Kidney: Length: 10.8 cm. Echogenicity within normal limits. No mass or hydronephrosis visualized. Left Kidney: Length: 10.1 cm. Echogenicity within normal limits. Mild left-sided hydronephrosis is noted. No mass visualized. Bladder: Decompressed, with a Foley catheter in place. IMPRESSION: Mild left-sided hydronephrosis noted, raising question for distal obstruction. Electronically Signed   By: Garald Balding M.D.   On: 12/20/2017 01:25   Mr Neck Soft Tissue Only W Wo Contrast  Result Date: 11/30/2017 CLINICAL DATA:  Rapidly progressive lymphadenopathy, RIGHT arm swelling since Thanksgiving. Suspected lymphoma. Status post biopsy, results pending. EXAM: MRI OF THE NECK WITH CONTRAST TECHNIQUE: Multiplanar, multisequence MR imaging was performed following the administration of intravenous contrast. CONTRAST:  57mL MULTIHANCE GADOBENATE DIMEGLUMINE 529 MG/ML IV SOLN COMPARISON:  CT neck November 29, 2017 and CT neck November 24, 2017 FINDINGS: Moderately motion degraded examination. PHARYNX AND LARYNX: Normal.  Widely patent airway. SALIVARY GLANDS: Intraparotid lymph nodes bilaterally measuring to 17 mm in deep lobe. No definite primary parotid mass though limited by motion. THYROID: Normal. LYMPH NODES: Severe RIGHT greater than LEFT cervical lymphadenopathy with intermediate to bright T2 signal and, mild enhancement. Lymphadenopathy all levels of the neck, at least 8.4 x 4.2 cm RIGHT supraclavicular nodal conglomeration. Lymphadenopathy abuts the scalene muscles impinges upon the RIGHT brachials plexus. Limited assessment of the brachium plexus due to motion. Massive LEFT retropectoral lymphadenopathy, lesser extent on the RIGHT. VASCULAR: Normal flow-voids. LIMITED INTRACRANIAL: Normal. VISUALIZED ORBITS: Normal. MASTOIDS AND VISUALIZED PARANASAL SINUSES:  Well-aerated. SKELETON: No abnormal bone marrow signal though not tailored for  evaluation. Limited assessment of spinal cord due to motion, no suspicious intracanalicular enhancement. UPPER CHEST: Lung apices are clear. No superior mediastinal lymphadenopathy. OTHER: Susceptibility artifact LEFT neck corresponding to use surgical clips. IMPRESSION: 1. Moderately motion degraded examination. 2. Bulky cervical and included chest lymphadenopathy with RIGHT brachial plexus impingement. Electronically Signed   By: Elon Alas M.D.   On: 11/30/2017 22:16   Ir US Guide Vasc Access Right  Result Date: 12/17/2017 CLINICAL DATA:  Lymphoma. Needs durable venous access for planned chemotherapy regimen. EXAM: TUNNELED PORT CATHETER PLACEMENT WITH ULTRASOUND AND FLUOROSCOPIC GUIDANCE FLUOROSCOPY TIME:  0.1 minutes  ; 12  uGym2 DAP ANESTHESIA/SEDATION: Intravenous Fentanyl and Versed were administered as conscious sedation during continuous monitoring of the patient's level of consciousness and physiological / cardiorespiratory status by the radiology RN, with a total moderate sedation time of 15 minutes. TECHNIQUE: The procedure, risks, benefits, and alternatives were explained to the patient. Questions regarding the procedure were encouraged and answered. The patient understands and consents to the procedure. As antibiotic prophylaxis, cefazolin 2 g was ordered pre-procedure and administered intravenously within one hour of incision. External compression of the right IJ vein secondary to adenopathy was noted, corresponding to findings from recent CT chest. Patency of the right external jugular vein was identified. Patency of the right EJ vein was confirmed with ultrasound with image documentation. An appropriate skin site was determined. Skin site was marked. Region was prepped using maximum barrier technique including cap and mask, sterile gown, sterile gloves, large sterile sheet, and Chlorhexidine as cutaneous antisepsis. The region was infiltrated locally with 1% lidocaine. Under real-time  ultrasound guidance, the right EJ vein was accessed with a 21 gauge micropuncture needle; the needle tip within the vein was confirmed with ultrasound image documentation. Needle was exchanged over a 018 guidewire for transitional dilator which allowed passage of the [facility] wire into the IVC. Over this, the transitional dilator was exchanged for a 5 [place] MPA catheter. A small incision was made on the right anterior chest wall and a subcutaneous pocket fashioned. The power-injectable port was positioned and its catheter tunneled to the right EJ dermatotomy site. The MPA catheter was exchanged over an Amplatz wire for a peel-away sheath, through which the port catheter, which had been trimmed to the appropriate length, was advanced and positioned under fluoroscopy with its tip at the cavoatrial junction. Spot chest radiograph confirms good catheter position and no pneumothorax. The pocket was closed with deep interrupted and subcuticular continuous 3-0 Monocryl sutures. The port was flushed per protocol. The incisions were covered with Dermabond then covered with a sterile dressing. COMPLICATIONS: COMPLICATIONS None immediate IMPRESSION: Technically successful right EJ power-injectable port catheter placement. Ready for routine use. Electronically Signed   By: Lucrezia Europe M.D.   On: 12/17/2017 14:29   Dg Chest Port 1 View  Result Date: 12/11/2017 CLINICAL DATA:  Shortness of breath. EXAM: PORTABLE CHEST 1 VIEW COMPARISON:  11/29/2017 CT and chest radiograph. FINDINGS: Upper limits normal heart size and mediastinal/hilar adenopathy again noted. This is a low volume film with mild bibasilar atelectasis. Equivocal slight increased streaky opacities within the right upper lobe noted. There is no evidence of pneumothorax or definite pleural effusion. IMPRESSION: Equivocal right upper lobe opacities which could represent early infection, focal edema or possibly aspiration. Radiographic follow-up recommended. Low  volume film with mild bibasilar atelectasis. Unchanged mediastinal/hilar adenopathy again noted. Electronically Signed   By: Dellis Filbert  Hu M.D.   On: 12/11/2017 17:48   Dg Chest Portable 1 View  Result Date: 11/29/2017 CLINICAL DATA:  Abnormal lymphadenopathy in the neck, chest swelling, initial encounter EXAM: PORTABLE CHEST 1 VIEW COMPARISON:  11/24/2017 FINDINGS: Cardiac shadow is within normal limits. Soft tissue mass lesion is again noted superimposed over the left hilum similar to that seen on recent CT of the neck. Fullness in the hilar regions is noted bilaterally likely representing some adenopathy. Aortic calcifications are seen. The lungs are clear. No bony abnormality is noted. IMPRESSION: Changes consistent with the known left upper lobe mass lesion as well as apparent hilar adenopathy particularly on the right. CT of the chest is recommended for further evaluation as previously described. Electronically Signed   By: Inez Catalina M.D.   On: 11/29/2017 19:10   [place] Core Biopsy (lymph Nodes)  Result Date: 12/02/2017 INDICATION: 83 year old female with presumed lymphoma. She presents for biopsy of nodal mass of the left axilla. EXAM: ULTRASOUND-GUIDED LYMPH NODE BIOPSY MEDICATIONS: None. ANESTHESIA/SEDATION: Moderate (conscious) sedation was employed during this procedure. A total of Versed 2.0 mg and Fentanyl 50 mcg was administered intravenously. Moderate Sedation Time: 10 minutes. The patient's level of consciousness and vital signs were monitored continuously by radiology nursing throughout the procedure under my direct supervision. FLUOROSCOPY TIME:  None COMPLICATIONS: None PROCEDURE: Informed written consent was obtained from the patient after a thorough discussion of the procedural risks, benefits and alternatives. All questions were addressed. Maximal Sterile Barrier Technique was utilized including caps, mask, sterile gowns, sterile gloves, sterile drape, hand hygiene and skin antiseptic. A  timeout was performed prior to the initiation of the procedure. Patient positioned supine position on the ultrasound stretcher. Ultrasound images of the left axillary region were performed with images stored and sent to PACs. The patient is prepped and draped in the usual sterile fashion. The skin and subcutaneous tissues were generously infiltrated 1% lidocaine for local anesthesia. Using ultrasound guidance, at least 8 separate 18 gauge core biopsy of the left axillary node were performed. Specimen placed in the saline. Final image was stored. Patient tolerated the procedure well and remained hemodynamically stable throughout. No complications were encountered and no significant blood loss. IMPRESSION: Status post ultrasound-guided biopsy of left axillary mass. Tissue specimen sent to pathology for complete histopathologic analysis. Signed, Dulcy Fanny. Earleen Newport, DO [facility] Electronically Signed   By: Corrie Mckusick D.O.   On: 12/02/2017 15:51   [place] Core Biopsy (lymph Nodes)  Result Date: 11/28/2017 INDICATION: 83 year old female with a clinical history of rapidly progressive right submental, cervical and supraclavicular lymphadenopathy. She presents today for urgent ultrasound-guided core biopsy. EXAM: ULTRASOUND GUIDED core needle BIOPSY OF right supraclavicular lymph node MEDICATIONS: None. ANESTHESIA/SEDATION: Fentanyl 100 mcg IV; Versed 2 mg IV Moderate Sedation Time:  15 minutes The patient was continuously monitored during the procedure by the interventional radiology nurse under my direct supervision. PROCEDURE: The procedure, risks, benefits, and alternatives were explained to the patient. Questions regarding the procedure were encouraged and answered. The patient understands and consents to the procedure. The right submental region, the right cervical chain in the right supraclavicular lymph nodes or all interrogated with ultrasound. There are  numerous enlarged, hypoechoic an abnormal appearing lymph nodes in each station. The most solid appearing lymph nodes are present in the right supraclavicular station. The right submental lymph node is slightly more cystic and may not yield viable cells. Therefore, the decision was made to proceed with biopsy  of the right supraclavicular station lymph nodes. The right supraclavicular region was prepped with chlorhexidine in a sterile fashion, and a sterile drape was applied covering the operative field. A sterile gown and sterile gloves were used for the procedure. Local anesthesia was provided with 1% Lidocaine. A small dermatotomy was made. Under real-time sonographic guidance, numerous 18 gauge core biopsies were obtained of several lymph nodes in the right supraclavicular station. Biopsy specimens were placed in saline and delivered to pathology for further analysis. Post biopsy imaging demonstrates no evidence of hematoma or active bleeding. The patient tolerated the procedure well. There was no significant bleeding. COMPLICATIONS: None immediate. FINDINGS: Extensive hypoechoic lymphadenopathy throughout the right neck and right submental region. IMPRESSION: Technically successful ultrasound-guided core biopsy of right supraclavicular lymph nodes. Electronically Signed   By: Jacqulynn Cadet M.D.   On: 11/28/2017 09:04   Ir Fluoro Guide Port Insertion Right  Result Date: 12/17/2017 CLINICAL DATA:  Lymphoma. Needs durable venous access for planned chemotherapy regimen. EXAM: TUNNELED PORT CATHETER PLACEMENT WITH ULTRASOUND AND FLUOROSCOPIC GUIDANCE FLUOROSCOPY TIME:  0.1 minutes  ; 12  uGym2 DAP ANESTHESIA/SEDATION: Intravenous Fentanyl and Versed were administered as conscious sedation during continuous monitoring of the patient's level of consciousness and physiological / cardiorespiratory status by the radiology RN, with a total moderate sedation time of 15 minutes. TECHNIQUE: The procedure, risks,  benefits, and alternatives were explained to the patient. Questions regarding the procedure were encouraged and answered. The patient understands and consents to the procedure. As antibiotic prophylaxis, cefazolin 2 g was ordered pre-procedure and administered intravenously within one hour of incision. External compression of the right IJ vein secondary to adenopathy was noted, corresponding to findings from recent CT chest. Patency of the right external jugular vein was identified. Patency of the right EJ vein was confirmed with ultrasound with image documentation. An appropriate skin site was determined. Skin site was marked. Region was prepped using maximum barrier technique including cap and mask, sterile gown, sterile gloves, large sterile sheet, and Chlorhexidine as cutaneous antisepsis. The region was infiltrated locally with 1% lidocaine. Under real-time ultrasound guidance, the right EJ vein was accessed with a 21 gauge micropuncture needle; the needle tip within the vein was confirmed with ultrasound image documentation. Needle was exchanged over a 018 guidewire for transitional dilator which allowed passage of the  [facility] wire into the IVC. Over this, the transitional dilator was exchanged for a 5 [place] MPA catheter. A small incision was made on the right anterior chest wall and a subcutaneous pocket fashioned. The power-injectable port was positioned and its catheter tunneled to the right EJ dermatotomy site. The MPA catheter was exchanged over an Amplatz wire for a peel-away sheath, through which the port catheter, which had been trimmed to the appropriate length, was advanced and positioned under fluoroscopy with its tip at the cavoatrial junction. Spot chest radiograph confirms good catheter position and no pneumothorax. The pocket was closed with deep interrupted and subcuticular continuous 3-0 Monocryl sutures. The port was flushed per protocol. The incisions were covered with Dermabond then covered  with a sterile dressing. COMPLICATIONS: COMPLICATIONS None immediate IMPRESSION: Technically successful right EJ power-injectable port catheter placement. Ready for routine use. Electronically Signed   By: Lucrezia Europe M.D.   On: 12/17/2017 14:29    Assessment: 83 y.o. [place] woman with a diffuse large cell B-cell non-Hodgkin's lymphoma, high-grade, diagnosed by lymph node biopsy December 2018  (1) prednisone started 12/03/2017, continued for 5 days  (a) repeat 12/19/2017  (2)  rituximab weekly started 12/04/2017  (a) 2d dose 12/11/2017 w/o event  (b) 3d dose due 12/18/2017--deferred due to patient's poor functional status  (c) to start 3d dose 12/25/2017  (3) to start CHOP with infusional doxorubicin via port  12/31/2017  (4) no evidence of tumor lysis-- continue allopurinol  (5) hypercalcemia--related to NHL; PTH and PTH related peptide not elevated  (a) pamidronate given 12/10/2017     Plan: Colleen [name] is making progress and has tolerated rituximab x2 previously w/o event. While in Rehab and hospitalized we are holding her chemotherapy (I was told by IV team they do not have the capability to do infusional doxorubicin treatments) but will proceed with her third cycle of rituximab tomorrow. This of course is not chemo but a form of immunotherapy.I am hoping to be able to start her regular chemotherapy next week.  She has had her first 2 rituximab cycles at a steady infusional rate and has had no reactions at all. She can participate in Rehab activities during infusion as tolerated.  Will continue to follow with you through discharge. Please let me know if I can be of further help     Bobetta Lime, MD 12/24/2017  6:53 AM Medical Oncology and Hematology [facility] [address] [place], [place] [address] Tel. [phone]    Fax. [phone]

## 2017-12-24 NOTE — Progress Notes (Signed)
[place] PHYSICAL MEDICINE & REHABILITATION     PROGRESS NOTE  Subjective/Complaints:  Pt seen sitting up in bed this AM.  She slept well overnight and states she feels better this AM.    ROS: Denies CP, nausea, vomiting, diarrhea.  Objective: Vital Signs: Blood pressure (!) 113/41, pulse 63, temperature 97.9 F (36.6 C), temperature source Oral, resp. rate 18, height 5\' 4"  (1.626 m), weight 85.4 kg (188 lb 4.4 oz), SpO2 94 %. No results found. No results for input(s): WBC, HGB, HCT, PLT in the last 72 hours. Recent Labs    12/22/17 0355 12/22/17 0911  NA 136 136  K 4.7 4.6  CL 95* 97*  GLUCOSE 101* 78  BUN 44* 42*  CREATININE 0.86 0.88  CALCIUM 10.5* 10.9*   CBG (last 3)  No results for input(s): GLUCAP in the last 72 hours.  Wt Readings from Last 3 Encounters:  12/24/17 85.4 kg (188 lb 4.4 oz)  12/08/17 88 kg (194 lb 0.1 oz)  11/28/17 73 kg (161 lb)    Physical Exam:  BP (!) 113/41 (BP Location: Left Arm)   Pulse 63   Temp 97.9 F (36.6 C) (Oral)   Resp 18   Ht 5\' 4"  (1.626 m)   Wt 85.4 kg (188 lb 4.4 oz)   SpO2 94%   BMI 32.32 kg/m  Constitutional: NAD. Obese  HENT: Normocephalic. Atraumatic Eyes: EOM are normal. No discharge.  Cardiovascular: RRR. No JVD.  Respiratory: No respiratory distress. Clear to auscultation.  GI: Bowel sounds are normal. She exhibits no distension.  Musculoskeletal: +Edema in all extremities >> LE, was improving, but UE edema increasing again Neurological: She isalertand oriented. Motor: RUE: Shoulder abduction 1/5, elbow flex/extension 2-/5, hand grip 3+/5 (stable, pain inhibition) LUE: 4/5 proximal to distal B/L LE: HF 4/5, KE 4+/5, ADF/PF 4+/5.  Skin. Warm and dry Psychiatric: She has a normal mood and affect. Her behavior is normal. Judgment and thought content normal.   Assessment/Plan: 1. Functional deficits secondary to diffuse large B-cell non-Hodgkin's lymphoma which require 3+ hours per day of interdisciplinary  therapy in a comprehensive inpatient rehab setting. Physiatrist is providing close team supervision and 24 hour management of active medical problems listed below. Physiatrist and rehab team continue to assess barriers to discharge/monitor patient progress toward functional and medical goals.  Function:  Bathing Bathing position   Position: Bed  Bathing parts Body parts bathed by patient: Chest, Abdomen, Right upper leg, Left upper leg Body parts bathed by helper: Left lower leg, Right lower leg, Right arm, Left arm  Bathing assist Assist Level: (mod A)   Set up : To open containers, To obtain items  Upper Body Dressing/Undressing Upper body dressing   What is the patient wearing?: [place] over shirt/dress - Perfomed by helper: Thread/unthread right sleeve, Thread/unthread left sleeve, Put head through opening, Pull shirt over trunk        Upper body assist Assist Level: Set up      Lower Body Dressing/Undressing Lower body dressing   What is the patient wearing?: Non-skid slipper socks           Non-skid slipper socks- Performed by helper: Don/doff right sock, Don/doff left sock               TED Hose - Performed by helper: Don/doff left TED hose, Don/doff right TED hose  Lower body assist        Naval architect  activity did not occur: No continent bowel/bladder event   Toileting steps completed by helper: Adjust clothing prior to toileting, Performs perineal hygiene, Adjust clothing after toileting    Toileting assist Assist level: Two helpers   Transfers Chair/bed transfer   Chair/bed transfer method: Other Chair/bed transfer assist level: dependent (Pt equals 0%) Chair/bed transfer assistive device: Mechanical lift Mechanical lift: Stedy   Locomotion Ambulation     Max distance: 4 ft Assist level: Touching or steadying assistance (Pt > 75%)   Wheelchair   Type: Manual Max wheelchair distance: 4' Assist Level:  Supervision or verbal cues  Cognition Comprehension Comprehension assist level: Understands basic 90% of the time/cues < 10% of the time  Expression Expression assist level: Expresses basic needs/ideas: With no assist  Social Interaction Social Interaction assist level: Interacts appropriately 90% of the time - Needs monitoring or encouragement for participation or interaction.  Problem Solving Problem solving assist level: Solves basic 75 - 89% of the time/requires cueing 10 - 24% of the time  Memory Memory assist level: Recognizes or recalls 75 - 89% of the time/requires cueing 10 - 24% of the time     Medical Problem List and Plan: 1.  Decreased functional mobility secondary to diffuse large B-cell non-Hodgkin's lymphoma.   Continue CIR    Per Heme/Onc, notes reviewed, plan Rituximab tomorrow, second round of steroids on 12/28.  Spoke with Heme/Onc regarding edema and Rituximab infusion.    Plan CHOP with infusional doxorubicin as an outpatient per Heme/Onc 2.  DVT Prophylaxis/Anticoagulation:  Monitor for any bleeding episodes   INR supratherapeutic on 1/2 3. Pain Management with brachial plexus impingement due to lymphadenopathy             Neurontin 300 mg 3 times a day, oxycodone as needed 4. Mood: Provide emotional support 5. Neuropsych: This patient is capable of making decisions on her own behalf. 6. Skin/Wound Care: Routine skin checks 7. Fluids/Electrolytes/Nutrition: Routine I&O's  8. Atrial fibrillation. Continue Tambocor 100 mg twice a day, Lopressor 12.5 mg twice a day   Controlled on 1/2 9. Hypertension. Monitor with increased mobility   Lisinopril 20 mg daily d/ced on 12/26   Relatively controlled on 1/2 10. Diastolic congestive heart failure. Monitor for any signs of fluid overload. Weigh patient daily Filed Weights   12/22/17 0555 12/23/17 0500 12/24/17 0410  Weight: 87.3 kg (192 lb 7.4 oz) 84.3 kg (185 lb 13.6 oz) 85.4 kg (188 lb 4.4 oz)    ?Reliability   Last  Echo 2016, reordered, completed, relatively stable, with no significant changes 11. Hypothyroidism. Synthroid 12. Gout. Allopurinol 300 mg daily. Monitor for any gout flareups 13. Constipation. Laxative assistance 14. RUE lymphedema: see #3, continue elevation, massage, edema mgt 15. Hypoalbuminemia   Supplement initiated on 12/19 16. Acute blood loss anemia   Hemoglobin 9.6 on 12/28   Labs pending   Continue to monitor 17. 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 Hyponatremia: Resolved   Cont to monitor 20. AKI    Cr 0.86 on 12/31   Labs pending   Hospitalist signed off   Encourage fluids   Will need to be mindful of tumor lysis syndrome    Cont to monitor 21. Leukocytosis   UA equivocal, urine culture with multiple species   CXR reviewed, showing improvement   Cont  to monitor 22. Thrombocytopenia:    Platelets 146 on 12/27   Labs pending   Continue to monitor 23. Postinfusion fluid overload   UE edema has significantly decreased, but LE edema persists   Lasix per hospitalist, increased to BID   LE dopplers neg for DVT   Lasix increased to BID   Will consider CT abd/pelvis 24. Urinary retention with incontinence now   Foley inserted by hospitalist with plans for outpt follow up 25. Leukocytosis: Resolved   UA+, Ucx no growth   Empiric keflex d/ced  LOS (Days) 15 A TO FACE EVALUATION WAS PERFORMED  Tereasa Yilmaz Lorie Phenix 12/24/2017 8:30 AM

## 2017-12-24 NOTE — Progress Notes (Signed)
[facility] for Warfarin Indication: atrial fibrillation  No Known Allergies  Patient Measurements: Height: (mistaken entry) Weight: 188 lb 4.4 oz (85.4 kg) IBW/kg (Calculated) : 54.7   Vital Signs: Temp: 97.9 F (36.6 C) (01/02 0410) Temp Source: Oral (01/02 0410) BP: 113/41 (01/02 0410) Pulse Rate: 63 (01/02 0410)  Labs: Recent Labs    12/22/17 0355 12/22/17 0911 12/23/17 0430 12/24/17 0414  LABPROT 24.7*  --  28.1* 32.3*  INR 2.25  --  2.66 3.17  CREATININE 0.86 0.88  --   --    Estimated Creatinine Clearance: 53.9 mL/min (by C-G formula based on SCr of 0.88 mg/dL).  Assessment: 83 y/o female admitted with lymphadenopathy requiring biopsy - diagnosed with diffuse large B-cell non Hodgkin's Lymphoma. She is on chronic warfarin for history of afib. Warfarin had been on hold for porta cath placement, now s/p R EJ port catheter placement 12/26 and warfarin restarted 12/26. INR trending up 1.83 > 2.25>2.66> 3.17 this morning.  PO intake variable, ~75% of meals eaten.  PTA warfarin dose: 2.5mg  daily except 5mg  MF (INR 1.91 on 12/7)   Goal of Therapy:  INR 2-3 Monitor platelets by anticoagulation protocol: Yes   Plan:  Warfarin 1 mg PO x1 Daily INR Monitor for s/sx bleeding  Maryanna Shape, PharmD, BCPS  Clinical Pharmacist  Pager: [phone]

## 2017-12-24 NOTE — Progress Notes (Signed)
Occupational Therapy Session Note  Patient Details  Name: Colleen [name] MRN: [id number] Date of Birth: 12-Jan-1936r  Today's Date: 12/24/2017 OT Individual Time: 1001-1100 OT Individual Time Calculation (min): 59 min    Short Term Goals: Week 2:  OT Short Term Goal 1 (Week 2): Pt will maintain static sitting during selfcare tasks with mod assist.  OT Short Term Goal 2 (Week 2): Pt will perform UB bathing with mod assist using AE PRN. OT Short Term Goal 3 (Week 2): Pt will perform stand pivot toilet transfer to the 3:1 with mod assist.  OT Short Term Goal 4 (Week 2): Pt will tolerate AAROM of the right shoulder 0-70 degrees for edema control and for greater use with selfcare tasks.    Skilled Therapeutic Interventions/Progress Updates:    Pt completed supine to sit EOB with max assist to start session.  Once sitting on the bed she was able to maintain static sitting balance with min assist.  Had pt work on using a sockaide for donning her gripper socks.  Max assist for applying the sock on the sockaide and then placing on each foot and pulling it up the foot.  Pt unable to perform any RUE elbow flexion actively to assist with pulling up the sock, but she could use her hand to hold the handle independently.  Once socks were donned, therapist used the Filiberto Pinks for sit to stand from the EOB with total assist.  Therapist then took pt over to the sink on the Steady while she sat, to work on grooming tasks.  She was able to apply her face cream and wash her face with min assist.  Constant max demonstrational cueing to maintain upright balance as pt tends to lean to the right side.  She needed min assist to setup denture cup for soaking her dentures as well.  Transitioned to the wheelchair from Steady once finished with pt's sister in the room.  Pt left in wheelchair with call button in reach and RUE positioned on pillows to assist with edema control.  Call button and phone in reach as well.     Therapy  Documentation Precautions:  Precautions Precautions: Fall, Other (comment) Precaution Comments: Chemo; BP only in LE; incontinent with mobility; R UE pain/lymphedema Restrictions Weight Bearing Restrictions: No  Pain: Pain Assessment Pain Assessment: Faces Faces Pain Scale: Hurts little more Pain Type: Acute pain Pain Location: Arm Pain Orientation: Right Pain Descriptors / Indicators: Discomfort Pain Intervention(s): Repositioned;Emotional support ADL: See Function Navigator for Current Functional Status.   Therapy/Group: Individual Therapy  Dorianne Perret OTR/L 12/24/2017, 1:19 PM

## 2017-12-25 ENCOUNTER — Ambulatory Visit: Payer: Medicare Other

## 2017-12-25 ENCOUNTER — Inpatient Hospital Stay (HOSPITAL_COMMUNITY): Payer: Medicare Other | Admitting: Occupational Therapy

## 2017-12-25 ENCOUNTER — Inpatient Hospital Stay (HOSPITAL_COMMUNITY): Payer: Medicare Other | Admitting: Physical Therapy

## 2017-12-25 LAB — LACTATE DEHYDROGENASE: LDH: 1147 U/L — ABNORMAL HIGH (ref 98–192)

## 2017-12-25 LAB — CBC WITH DIFFERENTIAL/PLATELET
BASOS PCT: 0 %
Basophils Absolute: 0 10*3/uL (ref 0.0–0.1)
EOS PCT: 0 %
Eosinophils Absolute: 0 10*3/uL (ref 0.0–0.7)
HCT: 35.4 % — ABNORMAL LOW (ref 36.0–46.0)
Hemoglobin: 11.7 g/dL — ABNORMAL LOW (ref 12.0–15.0)
Lymphocytes Relative: 3 %
Lymphs Abs: 0.4 10*3/uL — ABNORMAL LOW (ref 0.7–4.0)
MCH: 35.9 pg — ABNORMAL HIGH (ref 26.0–34.0)
MCHC: 33.1 g/dL (ref 30.0–36.0)
MCV: 108.6 fL — AB (ref 78.0–100.0)
MONO ABS: 0.1 10*3/uL (ref 0.1–1.0)
Monocytes Relative: 1 %
Neutro Abs: 14.2 10*3/uL — ABNORMAL HIGH (ref 1.7–7.7)
Neutrophils Relative %: 96 %
PLATELETS: 263 10*3/uL (ref 150–400)
RBC: 3.26 MIL/uL — ABNORMAL LOW (ref 3.87–5.11)
RDW: 17.5 % — AB (ref 11.5–15.5)
WBC: 14.7 10*3/uL — ABNORMAL HIGH (ref 4.0–10.5)

## 2017-12-25 LAB — PROTIME-INR
INR: 2.69
Prothrombin Time: 28.4 seconds — ABNORMAL HIGH (ref 11.4–15.2)

## 2017-12-25 LAB — URIC ACID: URIC ACID, SERUM: 4.1 mg/dL (ref 2.3–6.6)

## 2017-12-25 MED ORDER — WARFARIN SODIUM 2.5 MG PO TABS
2.5000 mg | ORAL_TABLET | Freq: Once | ORAL | Status: AC
  Administered 2017-12-25: 2.5 mg via ORAL
  Filled 2017-12-25: qty 1

## 2017-12-25 NOTE — Progress Notes (Signed)
[facility] PHYSICAL MEDICINE & REHABILITATION     PROGRESS NOTE  Subjective/Complaints:    ROS: Denies CP, nausea, vomiting, diarrhea.  Objective: Vital Signs: Blood pressure (!) 106/39, pulse (!) 51, temperature 98.6 F (37 C), temperature source Oral, resp. rate 16, height 5\' 4"  (1.626 m), weight 84.9 kg (187 lb 2.7 oz), SpO2 95 %. No results found. No results for input(s): WBC, HGB, HCT, PLT in the last 72 hours. No results for input(s): NA, K, CL, GLUCOSE, BUN, CREATININE, CALCIUM in the last 72 hours.  Invalid input(s): CO CBG (last 3)  No results for input(s): GLUCAP in the last 72 hours.  Wt Readings from Last 3 Encounters:  12/25/17 84.9 kg (187 lb 2.7 oz)  12/08/17 88 kg (194 lb 0.1 oz)  11/28/17 73 kg (161 lb)    Physical Exam:  BP (!) 106/39 (BP Location: Left Arm)   Pulse (!) 51   Temp 98.6 F (37 C) (Oral)   Resp 16   Ht 5\' 4"  (1.626 m)   Wt 84.9 kg (187 lb 2.7 oz)   SpO2 95%   BMI 32.13 kg/m  Constitutional: NAD. Obese  HENT: Normocephalic. Atraumatic Eyes: EOM are normal. No discharge.  Cardiovascular: RRR. No JVD.  Respiratory: No respiratory distress. Clear to auscultation.  GI: Bowel sounds are normal. She exhibits no distension.  Musculoskeletal: +Edema in all extremities >> LE, was improving, but UE edema increasing again Neurological: She isalertand oriented. Motor: RUE: Shoulder abduction 1/5, elbow flex/extension 2-/5, hand grip 3+/5 (stable, pain inhibition) LUE: 4/5 proximal to distal B/L LE: HF 4/5, KE 4+/5, ADF/PF 4+/5.  Skin. Warm and dry Psychiatric: She has a normal mood and affect. Her behavior is normal. Judgment and thought content normal.   Assessment/Plan: 1. Functional deficits secondary to diffuse large B-cell non-Hodgkin's lymphoma which require 3+ hours per day of interdisciplinary therapy in a comprehensive inpatient rehab setting. Physiatrist is providing close team supervision and 24 hour management of active medical  problems listed below. Physiatrist and rehab team continue to assess barriers to discharge/monitor patient progress toward functional and medical goals.  Function:  Bathing Bathing position   Position: (bedside recliner)  Bathing parts Body parts bathed by patient: Chest, Abdomen Body parts bathed by helper: Right arm, Left arm, Back  Bathing assist Assist Level: (mod A)   Set up : To open containers, To obtain items  Upper Body Dressing/Undressing Upper body dressing   What is the patient wearing?: [place] over shirt/dress - Perfomed by helper: Thread/unthread right sleeve, Thread/unthread left sleeve, Put head through opening, Pull shirt over trunk        Upper body assist Assist Level: Set up      Lower Body Dressing/Undressing Lower body dressing   What is the patient wearing?: Non-skid slipper socks           Non-skid slipper socks- Performed by helper: Don/doff right sock, Don/doff left sock               TED Hose - Performed by helper: Don/doff left TED hose, Don/doff right TED hose  Lower body assist        Toileting Toileting Toileting activity did not occur: No continent bowel/bladder event   Toileting steps completed by helper: Adjust clothing prior to toileting, Performs perineal hygiene, Adjust clothing after toileting    Toileting assist Assist level: Two helpers   Transfers Chair/bed transfer   Chair/bed transfer method: Other Chair/bed  transfer assist level: Total assist (Pt < 25%) Chair/bed transfer assistive device: Mechanical lift Mechanical lift: Primary school teacher     Max distance: 4 ft Assist level: Touching or steadying assistance (Pt > 75%)   Wheelchair   Type: Manual Max wheelchair distance: 35' Assist Level: Supervision or verbal cues  Cognition Comprehension Comprehension assist level: Understands basic 90% of the time/cues < 10% of the time  Expression Expression assist level: Expresses  basic needs/ideas: With extra time/assistive device  Social Interaction Social Interaction assist level: Interacts appropriately 75 - 89% of the time - Needs redirection for appropriate language or to initiate interaction.  Problem Solving Problem solving assist level: Solves basic 75 - 89% of the time/requires cueing 10 - 24% of the time  Memory Memory assist level: Recognizes or recalls 75 - 89% of the time/requires cueing 10 - 24% of the time     Medical Problem List and Plan: 1.  Decreased functional mobility secondary to diffuse large B-cell non-Hodgkin's lymphoma.   Continue CIR    Per Heme/Onc, notes reviewed, plan Rituximab tomorrow, second round of steroids on 12/28.  Spoke with Heme/Onc regarding edema and Rituximab infusion.    Plan CHOP with infusional doxorubicin as an outpatient per Heme/Onc 2.  DVT Prophylaxis/Anticoagulation:  Monitor for any bleeding episodes   INR therapeutic on 1/3 3. Pain Management with brachial plexus impingement due to lymphadenopathy             Neurontin 300 mg 3 times a day, oxycodone as needed 4. Mood: Provide emotional support 5. Neuropsych: This patient is capable of making decisions on her own behalf. 6. Skin/Wound Care: Routine skin checks 7. Fluids/Electrolytes/Nutrition: Routine I&O's  8. Atrial fibrillation. Continue Tambocor 100 mg twice a day, Lopressor 12.5 mg twice a day   Bradycardia d/c lopressor 9. Hypertension. Monitor with increased mobility   Lisinopril 20 mg daily d/ced on 12/26   Relatively controlled on 1/3 Vitals:   12/25/17 1208 12/25/17 1410  BP:  (!) 106/39  Pulse: (!) 48 (!) 51  Resp:  16  Temp: 98.4 F (36.9 C) 98.6 F (37 C)  SpO2: 93% 95%   10. Diastolic congestive heart failure. Monitor for any signs of fluid overload. Weigh patient daily Filed Weights   12/23/17 0500 12/24/17 0410 12/25/17 0547  Weight: 84.3 kg (185 lb 13.6 oz) 85.4 kg (188 lb 4.4 oz) 84.9 kg (187 lb 2.7 oz)    ?Reliability   Last Echo  2016, reordered, completed, relatively stable, with no significant changes 11. Hypothyroidism. Synthroid 12. Gout. Allopurinol 300 mg daily. Monitor for any gout flareups 13. Constipation. Laxative assistance 14. RUE lymphedema: see #3, continue elevation, massage, edema mgt 15. Hypoalbuminemia   Supplement initiated on 12/19 16. Acute blood loss anemia   Hemoglobin 9.6 on 12/28      Continue to monitor 17. 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 Hyponatremia: Resolved   Cont to monitor 20. AKI    Cr 0.86 on 12/31   Labs pending   Hospitalist signed off   Encourage fluids   Will need to be mindful of tumor lysis syndrome    Cont to monitor 21. Leukocytosis   UA equivocal, urine culture with multiple species   CXR reviewed, showing improvement   Cont to monitor 22. Thrombocytopenia:    Platelets 146  on 12/27   Labs pending   Continue to monitor 23. Postinfusion fluid overload   UE edema has significantly decreased, but LE edema persists   Lasix per hospitalist, increased to BID   LE dopplers neg for DVT   Lasix increased to BID   Will consider CT abd/pelvis 24. Urinary retention with incontinence now   Foley inserted by hospitalist with plans for outpt follow up 25. Leukocytosis: Resolved   UA+, Ucx no growth   Empiric keflex d/ced  LOS (Days) 16 A TO FACE EVALUATION WAS PERFORMED  Charlett Blake 12/25/2017 4:43 PM

## 2017-12-25 NOTE — Progress Notes (Signed)
Physical Therapy Session Note  Patient Details  Name: Colleen [name] MRN: [id number] Date of Birth: Aug 21, 1935  Today's Date: 12/25/2017 PT Individual Time: 5176-1607 AND 3710-6269 PT Individual Time Calculation (min): 55 min AND 40 min  Missed time: 35 min (fatigue and nursing care)  Short Term Goals: Week 2:  PT Short Term Goal 1 (Week 2): Pt will transfer sit<>stand w/ max assist x1 using LRAD. PT Short Term Goal 2 (Week 2): Pt will tolerate sitting up in recliner or w/c in between all therapies during duration of 1 day.  PT Short Term Goal 3 (Week 2): Pt will perform sit<>supine transfer w/ mod assist.  PT Short Term Goal 4 (Week 2): Pt will maintain static sitting balance w/o back support for 2 min w/ max assist.   Skilled Therapeutic Interventions/Progress Updates:   Session 1:  Pt supine upon arrival and agreeable to therapy, no c/o pain except in RUE w/ active movement. Worked on tolerance to upright activity this session and endurance w/ performing functional tasks. Mod assist to roll to L side-lying as PT provided total assist to apply barrier cream to gluteal fold. Mod assist to transition to EOB w/ manual facilitation at trunk and LEs. Manual facilitation for neutral upright posture in seated, min guard to maintain static sitting balance w/ LUE support. Max assist +2 stedy transfer to recliner, max assist for neutral upright positioning in recliner to decreased L lateral lean at rest. Assisted pt w/ drinking coffee and orange juice w/ manual facilitation of LUE use to bring cup to mouth. Increased shakiness this morning requiring min assist overall to drink from cups. Performed BLE strengthening exercises in recliner in between sips of drink. Exercises included LAQs 3x10, ankle pumps 3x10, and heel raises 3x10. Mod manual and tactile cues to attend to task w/ exercises. Encouraged pt to be more independent w/ performing exercises when outside of therapy, she verbalized understanding.  Ended session in recliner, call bell within reach and all needs met.   Session 2:  Pt in recliner upon arrival and agreeable to therapy, no c/o pain. Missed first 20 minutes of skilled PT session 2/2 sterile procedure. Attempted to perform transfer to w/c via stedy, however unable to boost into standing despite total assist +2. Used sara plus to transfer to w/c, total assist +2. Pt required manual assist at trunk and hips to facilitate hip extension during transfer for safety. Total assist w/c transport to gym for time management. Performed kinetron 2 min at lowest setting to work on [employer]. Unable to continue w/ kinetron despite 2-3 min break after first bout. Pt w/ increased work of breathing at this point and increased shakiness. Returned to room in w/c, total assist, ended session in w/c, call bell within reach and all needs met. Missed 15 minutes of skilled PT at end of session 2/2 fatigue.   Therapy Documentation Precautions:  Precautions Precautions: Fall, Other (comment) Precaution Comments: Chemo; BP only in LE; incontinent with mobility; R UE pain/lymphedema Restrictions Weight Bearing Restrictions: No Vital Signs: Therapy Vitals Temp: 97.8 F (36.6 C) Temp Source: Oral Pulse Rate: (!) 51 Resp: 16 BP: (!) 120/42 Patient Position (if appropriate): Sitting Oxygen Therapy SpO2: 92 % O2 Device: Not Delivered Pain: Pain Assessment Pain Assessment: 0-10 Pain Score: 7  Pain Type: Acute pain Pain Location: Arm Pain Orientation: Right Pain Descriptors / Indicators: Aching Pain Frequency: Intermittent Pain Onset: With Activity Patients Stated Pain Goal: 3 Pain Intervention(s): Medication (See eMAR);Repositioned Multiple Pain Sites: No  See Function Navigator for Current Functional Status.   Therapy/Group: Individual Therapy  Lyllie Cobbins K Arnette 12/25/2017, 9:34 AM

## 2017-12-25 NOTE — Progress Notes (Signed)
Occupational Therapy Weekly Progress Note  Patient Details  Name: Colleen [name] MRN: [id number] Date of Birth: 1934/12/28  Beginning of progress report period: December 19, 2017 End of progress report period: December 25, 2016  Today's Date: 12/25/2017 OT Individual Time: 1100-1200 OT Individual Time Calculation (min): 60 min    Patient has met 2 of 4 short term goals.  Colleen [name] continues to make slower progress than originally expected.  She continues to exhibit decreased AROM in the right shoulder as well as less movement in the elbow compared to admission.  Increased edema is still noted in the RUE as well as throughout all parts of her body.  Recently she had also demonstrated an increased need in physical assistance for self feeding even with the LUE secondary to weakness and decreased ability to maintain grasp on the left side.  She continues to need total assist for sit to stand as well as total +2 (pt 25%) for LB selfcare sit to stand or for toileting.  She has demonstrated some improvement with sitting balance and currently maintains static sitting at a min assist level, but currently needs mod to max assist for dynamic sitting balance.  Feel overall she will need a longer stay at SNF for further rehab based on current level of progress and pt needing to be able to ambulate up 9 steps to get into her house.  Family is receptive and understanding of this as well.  Will continue to follow until discharge plan is established.      Patient continues to demonstrate the following deficits: muscle weakness, decreased awareness, decreased problem solving and delayed processing and decreased sitting balance, decreased standing balance and decreased balance strategies and therefore will continue to benefit from skilled OT intervention to enhance overall performance with BADL and Reduce care partner burden.  Patient progressing toward long term goals..  Continue plan of care.  OT Short Term  Goals Week 3:  OT Short Term Goal 1 (Week 3): Pt will perform stand pivot toilet transfer to the 3:1 with mod assist.  OT Short Term Goal 2 (Week 3): Pt will tolerate AAROM of the right shoulder 0-70 degrees for edema control and for greater use with selfcare tasks.   OT Short Term Goal 3 (Week 3): Pt will complete LB dressing with max assist sit to stnad using reacher and sock aide PRN.   OT Short Term Goal 4 (Week 3): Pt will donn pullover gown with mod assist in unsupported sitting for 2 consecutive sessions.    Skilled Therapeutic Interventions/Progress Updates:    Pt completed bathing and dressing from sitting in the bedside chair.  Worked on static and dynamic sitting balance while engaged in bathing task.  Increased LOB posteriorly requiring mod assist for balance while engaged in washing UB and face.  Pt still with decreased ability to use the RUE for any bathing.  Max assist needed for thoroughness to wash under her breasts, both UEs, and her underarms.  Applied new hospital gown with pt also receiving infusion via porta cath.  Pt's spouse in for end of session as well and assisted with self feeding at conclusion of session.  Pt left in bedside recliner with nursing present and call button and phone in reach.  RUE elevated on pillows as well.    Therapy Documentation Precautions:  Precautions Precautions: Fall, Other (comment) Precaution Comments: Chemo; BP only in LE; incontinent with mobility; R UE pain/lymphedema Restrictions Weight Bearing Restrictions: No   Pain: Pain  Assessment Pain Assessment: 0-10 Pain Score: 5  Pain Type: Chronic pain Pain Location: Arm Pain Orientation: Right Pain Descriptors / Indicators: Discomfort Pain Onset: With Activity Pain Intervention(s): Emotional support;Repositioned ADL: See Function Navigator for Current Functional Status.   Therapy/Group: Individual Therapy  Treven Holtman OTR/L 12/25/2017, 12:44 PM

## 2017-12-25 NOTE — Progress Notes (Signed)
[place] for Warfarin Indication: atrial fibrillation  No Known Allergies  Patient Measurements: Height: (mistaken entry) Weight: 187 lb 2.7 oz (84.9 kg) IBW/kg (Calculated) : 54.7   Vital Signs: Temp: 98.4 F (36.9 C) (01/03 1208) Temp Source: Oral (01/03 1208) BP: 120/45 (01/03 1130) Pulse Rate: 48 (01/03 1208)  Labs: Recent Labs    12/23/17 0430 12/24/17 0414 12/25/17 0414  LABPROT 28.1* 32.3* 28.4*  INR 2.66 3.17 2.69   Estimated Creatinine Clearance: 53.8 mL/min (by C-G formula based on SCr of 0.88 mg/dL).  Assessment: 83 y/o female admitted with lymphadenopathy requiring biopsy - diagnosed with diffuse large B-cell non Hodgkin's Lymphoma. She is on chronic warfarin for history of afib. Warfarin had been on hold for porta cath placement, now s/p R EJ port catheter placement 12/26 and warfarin restarted 12/26. INR is therapeutic today at 2.69.   PO intake variable, ~25-75% of meals eaten. No bleeding noted.  PTA warfarin dose: 2.5mg  daily except 5mg  MF (INR 1.91 on 12/7)   Goal of Therapy:  INR 2-3 Monitor platelets by anticoagulation protocol: Yes   Plan:  Warfarin 2.5 mg PO x1 Daily INR Monitor for s/sx bleeding  Nicole Cella, RPh Clinical Pharmacist Pager: [phone] 8A-4P #[phone] 4P-10P [place] [phone]  12/25/2017 2:01 PM

## 2017-12-25 NOTE — Patient Care Conference (Signed)
Inpatient RehabilitationTeam Conference and Plan of Care Update Date: 12/24/2017   Time: 12:05 PM    Patient Name: Colleen [name]      Medical Record Number: [id number]  Date of Birth: December 23, 1935 Sex: Female         Room/Bed: 4M08C/4M08C-01 Payor Info: Payor: BLUE CROSS BLUE [employer]IELD MEDICARE / Plan: BCBS MEDICARE / Product Type: *No Product type* /    Admitting Diagnosis: NHL  Admit Date/Time:  12/09/2017  6:07 PM Admission Comments: No comment available   Primary Diagnosis:  <principal problem not specified> Principal Problem: <principal problem not specified>  Patient Active Problem List   Diagnosis Date Noted  . Cough   . Supplemental oxygen dependent   . Peripheral edema   . Urinary incontinence   . Urinary retention   . Hypervolemia   . Leukocytosis   . SOB (shortness of breath)   . Hypercalcemia   . HSV (herpes simplex virus) anogenital infection   . Lymphedema   . Hypoalbuminemia due to protein-calorie malnutrition ([place])   . Debility 12/09/2017  . Right arm pain   . Brachial plexopathy   . Diffuse large B-cell lymphoma of lymph nodes of neck ([place])   . PAF (paroxysmal atrial fibrillation) ([place])   . Chronic diastolic congestive heart failure ([place])   . History of TIA (transient ischemic attack)   . Benign essential HTN   . Acute blood loss anemia   . Malignant lymphomas of lymph nodes of head, face, and neck ([place]) 12/03/2017  . Hypoxia 11/30/2017  . Lymphadenopathy   . Acute cystitis without hematuria   . Lactic acidosis   . Degenerative arthritis of left knee 08/14/2016  . Arthritis of left foot 08/14/2016  . Peripheral vascular disease ([place]) 08/14/2016  . Emphysema of lung ([place]) 10/19/2015  . Restrictive lung disease 10/19/2015  . Chronic respiratory failure ([place]) 09/04/2015  . Chronic diastolic CHF (congestive heart failure) ([place]) 08/25/2015  . Paroxysmal atrial fibrillation ([place]) 08/24/2015  . Pulmonary infiltrate   . Multiple lung nodules on CT 08/07/2015   . Acute kidney injury (nontraumatic) ([place])   . Acute diastolic heart failure ([place]) 07/30/2015  . Acute diastolic CHF (congestive heart failure) ([place]) 07/30/2015  . Acute respiratory failure with hypoxia ([place]) 07/30/2015  . AKI (acute kidney injury) ([place]) 07/30/2015  . Sinus bradycardia 07/30/2015  . Hyponatremia 07/30/2015  . Junctional bradycardia 07/28/2015  . Shortness of breath 07/28/2015  . Chest pain 02/15/2015  . Carotid artery disease ([place]) 07/15/2013  . Radiculopathy 06/19/2011  . LEG PAIN, LEFT 06/21/2010  . PAIN IN THORACIC SPINE 05/11/2010  . OSTEOPENIA 02/23/2009  . Acute low back pain 11/17/2007  . Hyperlipidemia 07/03/2007  . Essential hypertension 07/03/2007  . Hypothyroidism 06/10/2007    Expected Discharge Date: Expected Discharge Date: (SNF)  Team Members Present: Physician leading conference: Dr. Delice Lesch Social Worker Present: Ovidio Kin, LCSW Nurse Present: Leonette Nutting, RN PT Present: Michaelene Song, PT OT Present: Clyda Greener, OT SLP Present: Windell Moulding, SLP PPS Coordinator present : Daiva Nakayama, RN, CRRN     Current Status/Progress Goal Weekly Team Focus  Medical   Decreased functional mobility secondary to diffuse large B-cell non-Hodgkin's lymphoma.  Improve mobility, brachial plexopathy, follow Heme/Onc recs, Foley per hospitalist  See above   Bowel/Bladder   continent of bowel. foley for urinary retention. LBM 12/23/17  maintain b/b with mod I assist  FOley care BID and as soiled. Monitor bowel.    Swallow/Nutrition/ Hydration  ADL's   Max assist for UB bathing and LB bathing.  total +2 for LB selfcare sit to stand.  Max assist for UB dressing and total assist +2 (pt 25%) for all LB dressing and for transfers stand pivot.    downgraded to mod to max assist.  selfcare retrainiing, balance retraining, neuromuscular re-education, AE education, pt/family education, therapeutic exercise.     Mobility   mod-max assist bed  mobility, mod-max assist sit<>stand from elevated surface (max +2 from low surface), stedy transfers  mod-max assist goals for OOB mobility, min assist for bed mobility   bed mobility, static/dynamic sitting balance w/o back support, positioning in bed and w/c, w/c mobility, transfers    Communication             Safety/Cognition/ Behavioral Observations            Pain   C/o pain to right arm treated with oxy prn.   pain <2 with mod I assist  assess pain q shift and prn.    Skin   Edema to all 4 extremities. Skin weeping in right arm occasionaly. MASD to groin treated with gerhartts as needed.   healing of masd, swelling decreased, no skin breakdown  assess skin q shift and prn. Monitor for breakdown.     Rehab Goals Patient on target to meet rehab goals: Yes Rehab Goals Revised: Many of pt's goals have been downgraded.  Pt now has all moderate to maximum assistance level goals. *See Care Plan and progress notes for long and short-term goals.     Barriers to Discharge  Current Status/Progress Possible Resolutions Date Resolved   Physician    Decreased caregiver support;Medical stability;Other (comments);Lack of/limited family support;Incontinence  Infusion therapy  See above  Therapies, recs per Heme/Onc, foley per hospitalist      Nursing                  PT  Inaccessible home environment;Decreased caregiver support;Home environment access/layout;Lack of/limited family support;Pending chemo/radiation                 OT                  SLP                SW                Discharge Planning/Teaching Needs:  Husband will not be physically able to provide the amount of care pt will need at home.  He and pt feel that pt will need to go for SNF care following CIR.  defer to the next venue   Team Discussion:  Pt to restart immunotherapy 12-25-17.   Pt with LE edema.  Pt is doing better this week with sitting balance, but has regressed in arm movement and goals have been downgraded  to max to total A.  Pt will d/c with foley catheter.  Revisions to Treatment Plan:  none    Continued Need for Acute Rehabilitation Level of Care: The patient requires daily medical management by a physician with specialized training in physical medicine and rehabilitation for the following conditions: Daily direction of a multidisciplinary physical rehabilitation program to ensure safe treatment while eliciting the highest outcome that is of practical value to the patient.: Yes Daily medical management of patient stability for increased activity during participation in an intensive rehabilitation regime.: Yes Daily analysis of laboratory values and/or radiology reports with any subsequent need for medication adjustment  of medical intervention for : Neurological problems;Other;Renal problems;Urological problems  Samay Delcarlo, Silvestre Mesi 12/25/2017, 10:47 AM

## 2017-12-25 NOTE — Progress Notes (Signed)
Social Work Patient ID: Colleen [name], female   DOB: November 30, 1935, 83 y.o.   MRN: [id number]   CSW met with pt and pt's husband to update them on team conference discussion.  Pt's husband was confused about SNF placement vs. CA treatments/center vs. Why pt couldn't stay on CIR until well.  CSW tried to explain all of this and that pt would tx to a SNF and then go for treatments from SNF and return to SNF after treatment, not going home until she is strong enough to do stairs and care be managed by family at home, as husband cannot provide physical care.  Husband seemed to understand this and we discussed facilities on SNF list CSW provided him last week.  CSW will begin SNF process and then discuss with pt/husband their preference in facility.  CSW will continue to follow and assist as needed.

## 2017-12-26 ENCOUNTER — Other Ambulatory Visit: Payer: Self-pay | Admitting: Oncology

## 2017-12-26 ENCOUNTER — Inpatient Hospital Stay (HOSPITAL_COMMUNITY): Payer: Medicare Other | Admitting: Physical Therapy

## 2017-12-26 ENCOUNTER — Inpatient Hospital Stay (HOSPITAL_COMMUNITY): Payer: Medicare Other | Admitting: Occupational Therapy

## 2017-12-26 LAB — PROTIME-INR
INR: 3.01
Prothrombin Time: 31 seconds — ABNORMAL HIGH (ref 11.4–15.2)

## 2017-12-26 MED ORDER — WARFARIN SODIUM 1 MG PO TABS
1.0000 mg | ORAL_TABLET | Freq: Once | ORAL | Status: AC
  Administered 2017-12-26: 1 mg via ORAL
  Filled 2017-12-26: qty 1

## 2017-12-26 NOTE — Progress Notes (Signed)
DISCONTINUE ON PATHWAY REGIMEN - Lymphoma and CLL     A cycle is every 21 days:     Rituximab      Cyclophosphamide      Doxorubicin      Vincristine      Prednisone   **Always confirm dose/schedule in your pharmacy ordering system**    REASON: Other Reason PRIOR TREATMENT: [id number]: R(IV)-CHOP q21 Days x 6 Cycles TREATMENT RESPONSE: Unable to Evaluate  START OFF PATHWAY REGIMEN - Lymphoma and CLL   [id number]:Bendamustine + Rituximab (90/375) q28 Days:   A cycle is every 28 days:     Bendamustine      Rituximab   **Always confirm dose/schedule in your pharmacy ordering system**    Patient Characteristics: Diffuse Large B-Cell Lymphoma, First Line, Stage III and IV Disease Type: Not Applicable Disease Type: Diffuse Large B-Cell Lymphoma Disease Type: Not Applicable Line of therapy: First Line [place] Stage: III Intent of Therapy: Curative Intent, Discussed with Patient

## 2017-12-26 NOTE — Progress Notes (Signed)
[facility] for Warfarin Indication: atrial fibrillation  No Known Allergies  Patient Measurements: Height: (mistaken entry) Weight: 192 lb 0.3 oz (87.1 kg) IBW/kg (Calculated) : 54.7   Vital Signs: Temp: 97.8 F (36.6 C) (01/04 0522) Temp Source: Oral (01/04 0522) BP: 149/66 (01/04 0522) Pulse Rate: 59 (01/04 0522)  Labs: Recent Labs    12/24/17 0414 12/25/17 0414 12/25/17 1823 12/26/17 0352  HGB  --   --  11.7*  --   HCT  --   --  35.4*  --   PLT  --   --  263  --   LABPROT 32.3* 28.4*  --  31.0*  INR 3.17 2.69  --  3.01   Estimated Creatinine Clearance: 54.5 mL/min (by C-G formula based on SCr of 0.88 mg/dL).  Assessment: 83 y/o female admitted with lymphadenopathy requiring biopsy - diagnosed with diffuse large B-cell non Hodgkin's Lymphoma. She is on chronic warfarin for history of afib. Warfarin had been on hold for porta cath placement, then s/p R EJ port catheter placement 12/26 and warfarin restarted 12/26. INR is therapeutic today at 3.01.  PO intake variable, low today. No bleeding noted.  PTA warfarin dose: 2.5mg  daily except 5mg  MF (INR 1.91 on 12/7)   Goal of Therapy:  INR 2-3 Monitor platelets by anticoagulation protocol: Yes   Plan:  Warfarin 1 mg PO x1 Daily INR Monitor for s/sx bleeding  Nicole Cella, RPh Clinical Pharmacist Pager: [phone] 8A-4P #[phone] 4P-10P [place] [phone] 12/26/2017 12:57 PM

## 2017-12-26 NOTE — Progress Notes (Signed)
Physical Therapy Weekly Progress Note  Patient Details  Name: Colleen [name] MRN: [id number] Date of Birth: Jan 08, 1935  Beginning of progress report period: December 19, 2017 End of progress report period: December 26, 2017  Today's Date: 12/26/2017 PT Individual Time: 7782-4235 AND 1415-1440 PT Individual Time Calculation (min): 40 min  AND 25 min  and Today's Date: 12/26/2017 PT Missed Time: 20 Minutes Missed Time Reason: Patient fatigue  Patient has met 4 of 4 short term goals. Pt continues to makes slow gains towards LTGs and demonstrates increased carryover when performing functional mobility as she performs correct technique w/ minimal prompting. She performs bed mobility w/ mod assist, however remains total assist for transfers using stedy 2/2 LE weakness and poor postural control in upright. She is most limited by deconditioning related to cancer diagnosis and requires increased time and frequent rest breaks for all active mobility. Pt and family continue require assistance w/ repositioning pt for comfort, pressure relief, and neutral positioning in both bed and chair. Treatment focus remains on pt/family education, increasing tolerance to upright/OOB, LE strengthening/HEP, and decreasing burden of care for bed mobility and transfers.   Patient continues to demonstrate the following deficits muscle weakness, decreased cardiorespiratoy endurance, decreased awareness, decreased problem solving, decreased memory and delayed processing and decreased sitting balance, decreased standing balance and decreased postural control and therefore will continue to benefit from skilled PT intervention to increase functional independence with mobility.  Patient progressing toward long term goals..  Continue plan of care. Ambulation goal discontinued as pt remains non-ambulatory at this time 2/2 deconditioning and LE weakness.   PT Short Term Goals Week 2:  PT Short Term Goal 1 (Week 2): Pt will transfer  sit<>stand w/ max assist x1 using LRAD. PT Short Term Goal 1 - Progress (Week 2): Met PT Short Term Goal 2 (Week 2): Pt will tolerate sitting up in recliner or w/c in between all therapies during duration of 1 day.  PT Short Term Goal 2 - Progress (Week 2): Met PT Short Term Goal 3 (Week 2): Pt will perform sit<>supine transfer w/ mod assist.  PT Short Term Goal 3 - Progress (Week 2): Met PT Short Term Goal 4 (Week 2): Pt will maintain static sitting balance w/o back support for 2 min w/ max assist.  PT Short Term Goal 4 - Progress (Week 2): Met Week 3:  PT Short Term Goal 1 (Week 3): Pt will tolerate 15 min of activity in supported sitting w/o increase in fatigue.  PT Short Term Goal 2 (Week 3): Pt will perform HEP LE strengthening exercises w/ min cues. PT Short Term Goal 3 (Week 3): With min cues, pt will verbalize assistance required to reposition in bed and/or chair for pressure relief, neutral upright posture, and comfort.   Skilled Therapeutic Interventions/Progress Updates:   Session 1:  Pt on bedpan upon arrival and agreeable to therapy, no c/o pain. Per RN and pt, both believe pt "pushed it too much" yesterday as it required 3 people to assist pt back to bed last night. They report pt was in recliner duration of day and while receiving immunotherapy treatment. Discussed importance of spending time in upright position to increase tolerance to upright, pt verbalized understanding and in agreement to get OOB if she can get to recliner to rest. Mod assist to roll and mod assist to sit up at EOB. Manual and tactile cues for technique. Pt maintained static sitting balance at EOB w/ min-mod assist for 2-3 minutes prior  to using stedy to transfer to recliner. Max assist to stand in stedy, manual and tactile cues for posture. Max assist to reposition in recliner 2/2 RUE discomfort. Pt agreeable to stay up in recliner until OT session at 11. Ended session in recliner, call bell within reach and all  needs met.   Session 2:  Pt supine upon arrival and agreeable to bed level exercises. No c/o pain. Provided max assist to don new gown as previous gown was soaked from weeping, verbal and manual cues for technique in supine. Performed ankle pumps 1x10, heel slides 1x10, and hip ER/IR 1x10 - frequent cues for technique . Pt requiring maximal stimulation to remain awake at this point. Provided total assist to reposition in bed for pressure relief and comfort, assisted NT w/ positioning to obtain vital signs. Ended session in supine, call bell within reach and all needs met.   Therapy Documentation Precautions:  Precautions Precautions: Fall, Other (comment) Precaution Comments: Chemo; BP only in LE; incontinent with mobility; R UE pain/lymphedema Restrictions Weight Bearing Restrictions: No Vital Signs: Therapy Vitals Temp: 97.8 F (36.6 C) Temp Source: Oral Pulse Rate: (!) 59 Resp: 16 BP: (!) 149/66 Patient Position (if appropriate): Sitting Oxygen Therapy SpO2: 96 % O2 Device: Not Delivered  See Function Navigator for Current Functional Status.  Therapy/Group: Individual Therapy  Aliza Moret K Arnette 12/26/2017, 8:46 AM

## 2017-12-26 NOTE — NC FL2 (Signed)
[place] LEVEL OF CARE SCREENING TOOL     IDENTIFICATION  Patient Name: Colleen [name] Birthdate: 01-03-1935 Sex: female Admission Date (Current Location): 12/09/2017  [facility] and [place] Number:  Herbalist and Address:  [facility], [place] [address], [place], [place] [address]      Provider Number: [id number]  Attending Physician Name and Address:  Jamse Arn, MD  Relative Name and Phone Number:       Current Level of Care: Other (Comment)([facility]) Recommended Level of Care: [place] Prior Approval Number:    Date Approved/Denied:   PASRR Number: [id number]  Discharge Plan: SNF    Current Diagnoses: Patient Active Problem List   Diagnosis Date Noted  . Cough   . Supplemental oxygen dependent   . Peripheral edema   . Urinary incontinence   . Urinary retention   . Hypervolemia   . Leukocytosis   . SOB (shortness of breath)   . Hypercalcemia   . HSV (herpes simplex virus) anogenital infection   . Lymphedema   . Hypoalbuminemia due to protein-calorie malnutrition ([place])   . Debility 12/09/2017  . Right arm pain   . Brachial plexopathy   . Diffuse large B-cell lymphoma of lymph nodes of neck ([place])   . PAF (paroxysmal atrial fibrillation) ([place])   . Chronic diastolic congestive heart failure ([place])   . History of TIA (transient ischemic attack)   . Benign essential HTN   . Acute blood loss anemia   . Malignant lymphomas of lymph nodes of head, face, and neck ([place]) 12/03/2017  . Hypoxia 11/30/2017  . Lymphadenopathy   . Acute cystitis without hematuria   . Lactic acidosis   . Degenerative arthritis of left knee 08/14/2016  . Arthritis of left foot 08/14/2016  . Peripheral vascular disease ([place]) 08/14/2016  . Emphysema of lung ([place]) 10/19/2015  . Restrictive lung disease 10/19/2015  . Chronic respiratory failure ([place]) 09/04/2015  . Chronic diastolic CHF  (congestive heart failure) ([place]) 08/25/2015  . Paroxysmal atrial fibrillation ([place]) 08/24/2015  . Pulmonary infiltrate   . Multiple lung nodules on CT 08/07/2015  . Acute kidney injury (nontraumatic) ([place])   . Acute diastolic heart failure ([place]) 07/30/2015  . Acute diastolic CHF (congestive heart failure) ([place]) 07/30/2015  . Acute respiratory failure with hypoxia ([place]) 07/30/2015  . AKI (acute kidney injury) ([place]) 07/30/2015  . Sinus bradycardia 07/30/2015  . Hyponatremia 07/30/2015  . Junctional bradycardia 07/28/2015  . Shortness of breath 07/28/2015  . Chest pain 02/15/2015  . Carotid artery disease ([place]) 07/15/2013  . Radiculopathy 06/19/2011  . LEG PAIN, LEFT 06/21/2010  . PAIN IN THORACIC SPINE 05/11/2010  . OSTEOPENIA 02/23/2009  . Acute low back pain 11/17/2007  . Hyperlipidemia 07/03/2007  . Essential hypertension 07/03/2007  . Hypothyroidism 06/10/2007    Orientation RESPIRATION BLADDER Height & Weight     Self, Time, Situation, Place  O2(1-2 LPM) (foley for urinary retention) Weight: 87.1 kg (192 lb 0.3 oz) Height:  (mistaken entry)  BEHAVIORAL SYMPTOMS/MOOD NEUROLOGICAL BOWEL NUTRITION STATUS      Continent Diet(Heart healthy; Regular textures and thin liquids)  AMBULATORY STATUS COMMUNICATION OF NEEDS Skin   Extensive Assist(stedy for transfers) Verbally Other (Comment)(edema to all 4 extremities; occasional weeping to right arm; MASD to groin treated with gerhartts PRN)                       Personal Care  Assistance Level of Assistance  Bathing, Feeding, Dressing Bathing Assistance: Maximum assistance Feeding assistance: (set up ) Dressing Assistance: Maximum assistance     Functional Limitations [place]  PT (By licensed PT), OT (By licensed OT), Bowel and bladder program(foley care BID and PRN)     PT Frequency: 5x/week OT Frequency: 5x/week Bowel and Bladder Program Frequency: foley care BID and PRN;  monitor for regular bowel movements          Contractures Contractures Info: Not present    Additional Factors Info  Code Status, Allergies Code Status Info: full Allergies Info: no known allergies           Current Medications (12/26/2017):  This is the current hospital active medication list Current Facility-Administered Medications  Medication Dose Route Frequency Provider Last Rate Last Dose  . 0.9 %  sodium chloride infusion   Intravenous Once PRN Jamse Arn, MD      . acetaminophen (TYLENOL) tablet 650 mg  650 mg Oral Q6H PRN Cathlyn Parsons, PA-C   650 mg at 12/23/17 2010   Or  . acetaminophen (TYLENOL) suppository 650 mg  650 mg Rectal Q6H PRN Angiulli, Lavon Paganini, PA-C      . albuterol (PROVENTIL) (2.5 MG/3ML) 0.083% nebulizer solution 2.5 mg  2.5 mg Nebulization Once PRN Jamse Arn, MD      . allopurinol (ZYLOPRIM) tablet 300 mg  300 mg Oral Daily AngiulliLavon Paganini, PA-C   300 mg at 12/26/17 1043  . alteplase (CATHFLO ACTIVASE) injection 2 mg  2 mg Intracatheter Once PRN Jamse Arn, MD      . diphenhydrAMINE (BENADRYL) capsule 25 mg  25 mg Oral QHS PRN Bary Leriche, PA-C   25 mg at 12/25/17 2203  . diphenhydrAMINE (BENADRYL) injection 25 mg  25 mg Intravenous Once PRN Jamse Arn, MD      . diphenhydrAMINE (BENADRYL) injection 50 mg  50 mg Intravenous Once PRN Jamse Arn, MD      . EPINEPHrine (ADRENALIN) 0.5 mg  0.5 mg Subcutaneous Once PRN Jamse Arn, MD      . EPINEPHrine (ADRENALIN) 0.5 mg  0.5 mg Subcutaneous Once PRN Jamse Arn, MD      . EPINEPHrine (ADRENALIN) 1 MG/10ML injection 0.25 mg  0.25 mg Intravenous Once PRN Jamse Arn, MD      . EPINEPHrine (ADRENALIN) 1 MG/10ML injection 0.25 mg  0.25 mg Intravenous Once PRN Jamse Arn, MD      . famotidine (PEPCID) IVPB 20 mg premix  20 mg Intravenous Once PRN Jamse Arn, MD      . feeding supplement (PRO-STAT SUGAR FREE 64) liquid 30 mL  30 mL Oral  BID Jamse Arn, MD   30 mL at 12/26/17 1042  . flecainide (TAMBOCOR) tablet 100 mg  100 mg Oral BID Cathlyn Parsons, PA-C   100 mg at 12/26/17 1042  . furosemide (LASIX) tablet 20 mg  20 mg Oral BID Samuella Cota, MD   20 mg at 12/26/17 1043  . gabapentin (NEURONTIN) capsule 300 mg  300 mg Oral TID Cathlyn Parsons, PA-C   300 mg at 12/26/17 1044  . Gerhardt's butt cream   Topical BID Jamse Arn, MD      . heparin lock flush 100 unit/mL  250 Units Intracatheter Once PRN Jamse Arn, MD      .  ipratropium-albuterol (DUONEB) 0.5-2.5 (3) MG/3ML nebulizer solution 3 mL  3 mL Nebulization Q4H PRN Cathlyn Parsons, PA-C   3 mL at 12/16/17 0603  . levothyroxine (SYNTHROID, LEVOTHROID) tablet 88 mcg  88 mcg Oral QAC breakfast Cathlyn Parsons, PA-C   88 mcg at 12/26/17 0557  . lidocaine (XYLOCAINE) 2 % jelly   Topical PRN Love, Pamela S, PA-C      . methylPREDNISolone sodium succinate (SOLU-MEDROL) 125 mg/2 mL injection 125 mg  125 mg Intravenous Once PRN Jamse Arn, MD      . ondansetron [facility]) tablet 4 mg  4 mg Oral Q6H PRN Angiulli, Lavon Paganini, PA-C       Or  . ondansetron [facility]) injection 4 mg  4 mg Intravenous Q6H PRN Angiulli, Lavon Paganini, PA-C      . oxyCODONE (Oxy IR/ROXICODONE) immediate release tablet 10 mg  10 mg Oral Q4H PRN Cathlyn Parsons, PA-C   10 mg at 12/25/17 0557  . polyethylene glycol (MIRALAX / GLYCOLAX) packet 17 g  17 g Oral Daily Cathlyn Parsons, PA-C   17 g at 12/26/17 1042  . senna-docusate (Senokot-S) tablet 1 tablet  1 tablet Oral QHS Cathlyn Parsons, PA-C   1 tablet at 12/25/17 2203  . sodium chloride flush (NS) 0.9 % injection 10 mL  10 mL Intracatheter PRN Jamse Arn, MD      . sodium chloride flush (NS) 0.9 % injection 10-40 mL  10-40 mL Intracatheter PRN Posey Pronto, Domenick Bookbinder, MD      . sodium chloride flush (NS) 0.9 % injection 3 mL  3 mL Intravenous PRN Jamse Arn, MD      . sorbitol 70 % solution 30 mL  30 mL  Oral Daily PRN Angiulli, Lavon Paganini, PA-C      . warfarin (COUMADIN) tablet 1 mg  1 mg Oral ONCE-1800 Jamse Arn, MD      . Warfarin - Pharmacist Dosing Inpatient   Does not apply [id number] Jamse Arn, MD         Discharge Medications: Please see discharge summary for a list of discharge medications.  Relevant Imaging Results:  Relevant Lab Results:   Additional Information [id number];  immunotherapy infusion weekly at [facility]; chemotherapy monthly at [facility] - starting 12-31-17 or 01-07-18 (TBD based on when pt transitions to SNF)  Antuan Limes, Silvestre Mesi, LCSW

## 2017-12-26 NOTE — Progress Notes (Signed)
Occupational Therapy Session Note  Patient Details  Name: Colleen [name] MRN: [id number] Date of Birth: 04/14/1935  Today's Date: 12/26/2017 OT Individual Time: 4034-7425 OT Individual Time Calculation (min): 57 min    Short Term Goals: Week 3:  OT Short Term Goal 1 (Week 3): Pt will perform stand pivot toilet transfer to the 3:1 with mod assist.  OT Short Term Goal 2 (Week 3): Pt will tolerate AAROM of the right shoulder 0-70 degrees for edema control and for greater use with selfcare tasks.   OT Short Term Goal 3 (Week 3): Pt will complete LB dressing with max assist sit to stnad using reacher and sock aide PRN.   OT Short Term Goal 4 (Week 3): Pt will donn pullover gown with mod assist in unsupported sitting for 2 consecutive sessions.    Skilled Therapeutic Interventions/Progress Updates:    Pt worked on bathing and dressing from bedside chair during session.  Max assist for UB bathing with max assist also needed for dynamic sitting balance on EOC while washing.  Issued LH sponge for washing lower legs and feet, but pt still needed assistance for thoroughness secondary to not being able to reach the RLE efficiently with use of the left arm.  Total assist for donning gripper socks.  Transferred pt back to bed with total assist stand pivot to the bed with total assist for transition to supine and to scoot up the EOB.  Therapist completed retrograde massage to the right hand and forearm.  Positioned hand above elbow to assist with this and encouraged AROM digit flexion and extension as well.  Call button and phone in reach.    Therapy Documentation Precautions:  Precautions Precautions: Fall, Other (comment) Precaution Comments: Chemo; BP only in LE; incontinent with mobility; R UE pain/lymphedema Restrictions Weight Bearing Restrictions: No  Pain: Pain Assessment Pain Assessment: 0-10 Pain Score: 3  Faces Pain Scale: Hurts little more Pain Type: Chronic pain Pain Location: Arm Pain  Orientation: Right Pain Descriptors / Indicators: Discomfort Pain Frequency: Intermittent Pain Onset: With Activity Patients Stated Pain Goal: 3 Pain Intervention(s): Repositioned ADL: See Function Navigator for Current Functional Status.   Therapy/Group: Individual Therapy  Yomira Flitton OTR/L 12/26/2017, 12:50 PM

## 2017-12-26 NOTE — Progress Notes (Signed)
Colleen [name]   DOB:06-Oct-1935   AL#:[id number]   BDZ#:[id number]  Subjective: Colleen [name] tolerated the rituximab infusion yesterday w/o event. She does say it "wiped her out" and she was exhausted trying to do rehab. She is very encouraged that her R arm finally has some motion to it. She understands SNF placement is in process. No family in room  Objective: Elderly white woman examined in bed  Vitals:   12/25/17 1410 12/26/17 0522  BP: (!) 106/39 (!) 149/66  Pulse: (!) 51 (!) 59  Resp: 16 16  Temp: 98.6 F (37 C) 97.8 F (36.6 C)  SpO2: 95% 96%    Body mass index is 32.96 kg/m.  Intake/Output Summary (Last 24 hours) at 12/26/2017 0742 Last data filed at 12/26/2017 0600 Gross per 24 hour  Intake 340 ml  Output 750 ml  Net -410 ml    EOMs intact Oropharynx clear, no thrush or other lesions Persistent R [place] adenopathy Lungs no rales or rhonchi, auscultated anterolaterally Heart regular rate and rhythm Abd soft, nontender, positive bowel sounds; foley in place Neuro: RUE now 2/5 (was 0/5) Breasts: deferred    CBG (last 3)  No results for input(s): GLUCAP in the last 72 hours.   Labs:  Lab Results  Component Value Date   WBC 14.7 (H) 12/25/2017   HGB 11.7 (L) 12/25/2017   HCT 35.4 (L) 12/25/2017   MCV 108.6 (H) 12/25/2017   PLT 263 12/25/2017   NEUTROABS 14.2 (H) 12/25/2017    @LASTCHEMISTRY @  Urine Studies No results for input(s): UHGB, CRYS in the last 72 hours.  Invalid input(s): UACOL, UAPR, USPG, UPH, UTP, UGL, UKET, UBIL, UNIT, UROB, [place], UEPI, UWBC, [place], [place]  Basic Metabolic Panel: Recent Labs  Lab 12/19/17 0944 12/20/17 0428 12/21/17 0404 12/22/17 0355 12/22/17 0911  NA 138 135 135 136 136  K 5.2* 4.9 5.0 4.7 4.6  CL 97* 98* 97* 95* 97*  CO2 27 26 27 28 28   GLUCOSE 71 98 113* 101* 78  BUN 70* 69* 55* 44* 42*  CREATININE 1.64* 1.40* 1.00 0.86 0.88  CALCIUM 10.9* 10.7* 10.4* 10.5* 10.9*   GFR Estimated Creatinine Clearance:  54.5 mL/min (by C-G formula based on SCr of 0.88 mg/dL). Liver Function Tests: Recent Labs  Lab 12/20/17 0428  AST 43*  ALT 17  ALKPHOS 86  BILITOT 0.7  PROT 4.8*  ALBUMIN 2.1*   No results for input(s): LIPASE, AMYLASE in the last 168 hours. No results for input(s): AMMONIA in the last 168 hours. Coagulation profile Recent Labs  Lab 12/22/17 0355 12/23/17 0430 12/24/17 0414 12/25/17 0414 12/26/17 0352  INR 2.25 2.66 3.17 2.69 3.01    CBC: Recent Labs  Lab 12/20/17 0428 12/25/17 1823  WBC 3.8* 14.7*  NEUTROABS 3.1 14.2*  HGB 8.9* 11.7*  HCT 27.4* 35.4*  MCV 108.3* 108.6*  PLT 191 263   Cardiac Enzymes: No results for input(s): CKTOTAL, CKMB, CKMBINDEX, TROPONINI in the last 168 hours. BNP: Invalid input(s): POCBNP CBG: No results for input(s): GLUCAP in the last 168 hours. D-Dimer No results for input(s): DDIMER in the last 72 hours. Hgb A1c No results for input(s): HGBA1C in the last 72 hours. Lipid Profile No results for input(s): CHOL, HDL, LDLCALC, TRIG, CHOLHDL, LDLDIRECT in the last 72 hours. Thyroid function studies No results for input(s): TSH, T4TOTAL, T3FREE, THYROIDAB in the last 72 hours.  Invalid input(s): FREET3 Anemia work up No results for input(s): VITAMINB12, FOLATE, FERRITIN, TIBC, IRON, RETICCTPCT  in the last 72 hours. Microbiology Recent Results (from the past 240 hour(s))  Urine Culture     Status: None   Collection Time: 12/16/17  3:04 PM  Result Value Ref Range Status   Specimen Description URINE, CLEAN CATCH  Final   Special Requests NONE  Final   Culture NO GROWTH  Final   Report Status 12/17/2017 FINAL  Final  Surgical PCR screen     Status: None   Collection Time: 12/17/17  5:08 AM  Result Value Ref Range Status   MRSA, PCR NEGATIVE NEGATIVE Final   Staphylococcus aureus NEGATIVE NEGATIVE Final    Comment: (NOTE) The Xpert SA Assay (FDA approved for NASAL specimens in patients 82 years of age and older), is one  component of a comprehensive surveillance program. It is not intended to diagnose infection nor to guide or monitor treatment.       Studies:  Dg Chest 2 View  Result Date: 12/12/2017 CLINICAL DATA:  Follow-up evaluation for leukocytosis. Right arm swelling. History of CHF, hypertension, TIA, coronary artery disease, atrial fibrillation. EXAM: CHEST  2 VIEW COMPARISON:  Chest x-rays dated 12/11/2017 and 08/14/2015. Chest CT dated 11/29/2017. FINDINGS: Improved aeration within the right upper lung compared to yesterday's chest x-ray suggesting improved fluid status. No new lung findings. Cardiomediastinal silhouette is stable in size and configuration compared to yesterday's exam, compatible with the extensive mediastinal lymphadenopathy demonstrated on recent CT. Lateral view is suboptimal all. I cannot exclude small pleural effusion on the lateral view. IMPRESSION: 1. Improved aeration within the right upper lobe compared to yesterday's chest x-ray, likely indicating improved fluid status. Lungs otherwise clear. Questionable pleural effusion on the lateral projection. 2. Abnormal configuration of the cardiomediastinal silhouette, compatible with the extensive mediastinal and perihilar lymphadenopathy better demonstrated on recent CT chest of 11/29/2017. Electronically Signed   By: Franki Cabot M.D.   On: 12/12/2017 16:02   Ct Soft Tissue Neck Wo Contrast  Result Date: 11/29/2017 CLINICAL DATA:  83 y/o F; patient undergoing evaluation for lymphadenopathy with biopsy in the left subclavian area straightening, now presenting with swelling of the left arm and chest with associated severe pain. EXAM: CT NECK WITHOUT CONTRAST TECHNIQUE: Multidetector CT imaging of the neck was performed following the standard protocol without intravenous contrast. COMPARISON:  11/24/2017 CT of the neck. FINDINGS: Pharynx and larynx: Normal. No mass or swelling. Salivary glands: No inflammation, mass, or stone. Thyroid:  Normal. Lymph nodes: Interval progression of diffuse lymphadenopathy, for example a left level 5B lymph node measures 12 x 12 mm, previously 6 mm (series 3, image 88). Increased lymphadenopathy is best appreciated left posterior cervical chain and left upper axilla but increase in adenopathy is present diffusely, for example a right upper peritracheal node measuring 24 x 31 mm, previously 17 x 23 mm (series 3, image 121). Fat stranding surrounding conglomerate lymph nodes throughout the right cervical chain, right greater than left supraclavicular regions, and left axilla may represent infiltrative neoplasm or lymphedema. Vascular: Calcific atherosclerosis of the aorta and carotid siphons. Limited intracranial: Negative. Visualized orbits: Negative. Mastoids and visualized paranasal sinuses: Small left maxillary sinus mucous retention cyst. Otherwise negative. Skeleton: Stable mild cervical spondylosis. No high-grade bony canal stenosis. Upper chest: Emphysema and scarring in bilateral upper lobes is stable. Other: None. IMPRESSION: Interval progression of lymphadenopathy from 11/24/2017 best appreciated in the left cervical level 5B station, but measurable increase is present diffusely. Findings indicate rapidly progressive lymphoproliferative or metastatic disease. Correlation with biopsy is recommended. Electronically  Signed   By: Kristine Garbe M.D.   On: 11/29/2017 22:54   Ct Chest W Contrast  Result Date: 11/29/2017 CLINICAL DATA:  Left-sided chest pain and swelling, recent biopsy on right supraclavicular lymph nodes EXAM: CT CHEST WITH CONTRAST TECHNIQUE: Multidetector CT imaging of the chest was performed during intravenous contrast administration. CONTRAST:  35mL ISOVUE-300 IOPAMIDOL (ISOVUE-300) INJECTION 61% COMPARISON:  11/24/2017 FINDINGS: Cardiovascular: Atherosclerotic changes of the thoracic aorta are noted without aneurysmal dilatation or dissection. Coronary calcifications are seen.  No pulmonary emboli are noted. Some attenuation of the pulmonary arterial branches is noted secondary to hilar adenopathy. Additionally some displacement of the vasculature in the neck is noted particularly on the right also related to adenopathy. Mediastinum/Nodes: Thoracic inlet demonstrates evidence of significant right supraclavicular lymphadenopathy. The largest nodal mass measures approximately 10 by 4.9 cm and causes displacement of the adjacent vascular structures in the right neck. Additional adenopathy is noted in the anterior neck similar to that seen on prior examination. Significant mediastinal and hilar adenopathy is identified. The largest of these in the right hilum measures 2.3 cm in short axis. Subcarinal lymphadenopathy is noted measuring 16 mm in short axis. Prevascular adenopathy is noted measuring 2.2 cm in short axis. The esophagus is within normal limits. Considerable adenopathy is noted in the posterior mediastinum surrounding the descending aorta. The nodal mass measures approximately 2.9 by 3.9 cm. Considerable right axillary adenopathy is noted extending along the lateral chest wall. Lungs/Pleura: Emphysematous changes are noted in the lungs bilaterally. Mild scarring is seen. No focal parenchymal mass is noted. No sizable effusion is seen. Upper Abdomen: Gallbladder has been surgically removed. The liver is within normal limits. The adrenal glands and visualized spleen are unremarkable. Musculoskeletal: T9 compression deformity is noted. Some lucency is noted within. It would be difficult to exclude some metastatic involvement given the findings throughout the chest. Degenerative changes of the thoracic spine are seen. No rib abnormality is noted. Considerable soft tissue mass is noted in the left anterior chest wall involving the pectoral muscles and subpectoral region. The overall size of the mass measures approximately 10.5 by 6.7 cm and Ing gallstone the subclavian and axillary  artery is and veins. Significant skin thickening and edema is noted within the left breast. It would be difficult to exclude a portion of this representing a breast mass. There is apparent involvement of the chest wall although no bony destruction is seen. IMPRESSION: Diffuse lymphadenopathy involving the supraclavicular regions, mediastinum, bilateral hila as well as the axillary regions bilaterally as described above. Correlation with the recent biopsy results is recommended. It would be difficult to exclude a portion of the abnormality in the left chest wall to be related to the overlying breast given the degree of skin thickening and edema identified inferiorly. Alternatively this may represent an aggressive lymphoma. Further workup with PET-CT is recommended. T9 compression deformity with some lucencies noted within. This may be strictly related to the compression deformity although the possibility of metastatic disease would deserve consideration as well. No other definitive bony abnormality is noted. Aortic Atherosclerosis (ICD10-I70.0) and Emphysema (ICD10-J43.9). Electronically Signed   By: Inez Catalina M.D.   On: 11/29/2017 20:01   US Renal  Result Date: 12/20/2017 CLINICAL DATA:  Acute onset of renal insufficiency. Initial encounter. EXAM: RENAL / URINARY TRACT ULTRASOUND COMPLETE COMPARISON:  None. FINDINGS: Right Kidney: Length: 10.8 cm. Echogenicity within normal limits. No mass or hydronephrosis visualized. Left Kidney: Length: 10.1 cm. Echogenicity within normal limits. Mild left-sided  hydronephrosis is noted. No mass visualized. Bladder: Decompressed, with a Foley catheter in place. IMPRESSION: Mild left-sided hydronephrosis noted, raising question for distal obstruction. Electronically Signed   By: Garald Balding M.D.   On: 12/20/2017 01:25   Mr Neck Soft Tissue Only W Wo Contrast  Result Date: 11/30/2017 CLINICAL DATA:  Rapidly progressive lymphadenopathy, RIGHT arm swelling since  Thanksgiving. Suspected lymphoma. Status post biopsy, results pending. EXAM: MRI OF THE NECK WITH CONTRAST TECHNIQUE: Multiplanar, multisequence MR imaging was performed following the administration of intravenous contrast. CONTRAST:  31mL MULTIHANCE GADOBENATE DIMEGLUMINE 529 MG/ML IV SOLN COMPARISON:  CT neck November 29, 2017 and CT neck November 24, 2017 FINDINGS: Moderately motion degraded examination. PHARYNX AND LARYNX: Normal.  Widely patent airway. SALIVARY GLANDS: Intraparotid lymph nodes bilaterally measuring to 17 mm in deep lobe. No definite primary parotid mass though limited by motion. THYROID: Normal. LYMPH NODES: Severe RIGHT greater than LEFT cervical lymphadenopathy with intermediate to bright T2 signal and, mild enhancement. Lymphadenopathy all levels of the neck, at least 8.4 x 4.2 cm RIGHT supraclavicular nodal conglomeration. Lymphadenopathy abuts the scalene muscles impinges upon the RIGHT brachials plexus. Limited assessment of the brachium plexus due to motion. Massive LEFT retropectoral lymphadenopathy, lesser extent on the RIGHT. VASCULAR: Normal flow-voids. LIMITED INTRACRANIAL: Normal. VISUALIZED ORBITS: Normal. MASTOIDS AND VISUALIZED PARANASAL SINUSES: Well-aerated. SKELETON: No abnormal bone marrow signal though not tailored for evaluation. Limited assessment of spinal cord due to motion, no suspicious intracanalicular enhancement. UPPER CHEST: Lung apices are clear. No superior mediastinal lymphadenopathy. OTHER: Susceptibility artifact LEFT neck corresponding to use surgical clips. IMPRESSION: 1. Moderately motion degraded examination. 2. Bulky cervical and included chest lymphadenopathy with RIGHT brachial plexus impingement. Electronically Signed   By: Elon Alas M.D.   On: 11/30/2017 22:16   Ir US Guide Vasc Access Right  Result Date: 12/17/2017 CLINICAL DATA:  Lymphoma. Needs durable venous access for planned chemotherapy regimen. EXAM: TUNNELED PORT CATHETER  PLACEMENT WITH ULTRASOUND AND FLUOROSCOPIC GUIDANCE FLUOROSCOPY TIME:  0.1 minutes  ; 12  uGym2 DAP ANESTHESIA/SEDATION: Intravenous Fentanyl and Versed were administered as conscious sedation during continuous monitoring of the patient's level of consciousness and physiological / cardiorespiratory status by the radiology RN, with a total moderate sedation time of 15 minutes. TECHNIQUE: The procedure, risks, benefits, and alternatives were explained to the patient. Questions regarding the procedure were encouraged and answered. The patient understands and consents to the procedure. As antibiotic prophylaxis, cefazolin 2 g was ordered pre-procedure and administered intravenously within one hour of incision. External compression of the right IJ vein secondary to adenopathy was noted, corresponding to findings from recent CT chest. Patency of the right external jugular vein was identified. Patency of the right EJ vein was confirmed with ultrasound with image documentation. An appropriate skin site was determined. Skin site was marked. Region was prepped using maximum barrier technique including cap and mask, sterile gown, sterile gloves, large sterile sheet, and Chlorhexidine as cutaneous antisepsis. The region was infiltrated locally with 1% lidocaine. Under real-time ultrasound guidance, the right EJ vein was accessed with a 21 gauge micropuncture needle; the needle tip within the vein was confirmed with ultrasound image documentation. Needle was exchanged over a 018 guidewire for transitional dilator which allowed passage of the [facility] wire into the IVC. Over this, the transitional dilator was exchanged for a 5 [place] MPA catheter. A small incision was made on the right anterior chest wall and a subcutaneous pocket fashioned. The power-injectable port was positioned and its catheter tunneled  to the right EJ dermatotomy site. The MPA catheter was exchanged over an Amplatz wire for a peel-away sheath, through which  the port catheter, which had been trimmed to the appropriate length, was advanced and positioned under fluoroscopy with its tip at the cavoatrial junction. Spot chest radiograph confirms good catheter position and no pneumothorax. The pocket was closed with deep interrupted and subcuticular continuous 3-0 Monocryl sutures. The port was flushed per protocol. The incisions were covered with Dermabond then covered with a sterile dressing. COMPLICATIONS: COMPLICATIONS None immediate IMPRESSION: Technically successful right EJ power-injectable port catheter placement. Ready for routine use. Electronically Signed   By: Lucrezia Europe M.D.   On: 12/17/2017 14:29   Dg Chest Port 1 View  Result Date: 12/11/2017 CLINICAL DATA:  Shortness of breath. EXAM: PORTABLE CHEST 1 VIEW COMPARISON:  11/29/2017 CT and chest radiograph. FINDINGS: Upper limits normal heart size and mediastinal/hilar adenopathy again noted. This is a low volume film with mild bibasilar atelectasis. Equivocal slight increased streaky opacities within the right upper lobe noted. There is no evidence of pneumothorax or definite pleural effusion. IMPRESSION: Equivocal right upper lobe opacities which could represent early infection, focal edema or possibly aspiration. Radiographic follow-up recommended. Low volume film with mild bibasilar atelectasis. Unchanged mediastinal/hilar adenopathy again noted. Electronically Signed   By: Margarette Canada M.D.   On: 12/11/2017 17:48   Dg Chest Portable 1 View  Result Date: 11/29/2017 CLINICAL DATA:  Abnormal lymphadenopathy in the neck, chest swelling, initial encounter EXAM: PORTABLE CHEST 1 VIEW COMPARISON:  11/24/2017 FINDINGS: Cardiac shadow is within normal limits. Soft tissue mass lesion is again noted superimposed over the left hilum similar to that seen on recent CT of the neck. Fullness in the hilar regions is noted bilaterally likely representing some adenopathy. Aortic calcifications are seen. The lungs are  clear. No bony abnormality is noted. IMPRESSION: Changes consistent with the known left upper lobe mass lesion as well as apparent hilar adenopathy particularly on the right. CT of the chest is recommended for further evaluation as previously described. Electronically Signed   By: Inez Catalina M.D.   On: 11/29/2017 19:10   [place] Core Biopsy (lymph Nodes)  Result Date: 12/02/2017 INDICATION: 83 year old female with presumed lymphoma. She presents for biopsy of nodal mass of the left axilla. EXAM: ULTRASOUND-GUIDED LYMPH NODE BIOPSY MEDICATIONS: None. ANESTHESIA/SEDATION: Moderate (conscious) sedation was employed during this procedure. A total of Versed 2.0 mg and Fentanyl 50 mcg was administered intravenously. Moderate Sedation Time: 10 minutes. The patient's level of consciousness and vital signs were monitored continuously by radiology nursing throughout the procedure under my direct supervision. FLUOROSCOPY TIME:  None COMPLICATIONS: None PROCEDURE: Informed written consent was obtained from the patient after a thorough discussion of the procedural risks, benefits and alternatives. All questions were addressed. Maximal Sterile Barrier Technique was utilized including caps, mask, sterile gowns, sterile gloves, sterile drape, hand hygiene and skin antiseptic. A timeout was performed prior to the initiation of the procedure. Patient positioned supine position on the ultrasound stretcher. Ultrasound images of the left axillary region were performed with images stored and sent to PACs. The patient is prepped and draped in the usual sterile fashion. The skin and subcutaneous tissues were generously infiltrated 1% lidocaine for local anesthesia. Using ultrasound guidance, at least 8 separate 18 gauge core biopsy of the left axillary node were performed. Specimen placed in the saline. Final image was stored. Patient tolerated the procedure well and remained hemodynamically stable throughout. No complications were  encountered and no significant blood loss. IMPRESSION: Status post ultrasound-guided biopsy of left axillary mass. Tissue specimen sent to pathology for complete histopathologic analysis. Signed, Dulcy Fanny. Earleen Newport, DO [facility] Electronically Signed   By: Corrie Mckusick D.O.   On: 12/02/2017 15:51   [place] Core Biopsy (lymph Nodes)  Result Date: 11/28/2017 INDICATION: 83 year old female with a clinical history of rapidly progressive right submental, cervical and supraclavicular lymphadenopathy. She presents today for urgent ultrasound-guided core biopsy. EXAM: ULTRASOUND GUIDED core needle BIOPSY OF right supraclavicular lymph node MEDICATIONS: None. ANESTHESIA/SEDATION: Fentanyl 100 mcg IV; Versed 2 mg IV Moderate Sedation Time:  15 minutes The patient was continuously monitored during the procedure by the interventional radiology nurse under my direct supervision. PROCEDURE: The procedure, risks, benefits, and alternatives were explained to the patient. Questions regarding the procedure were encouraged and answered. The patient understands and consents to the procedure. The right submental region, the right cervical chain in the right supraclavicular lymph nodes or all interrogated with ultrasound. There are numerous enlarged, hypoechoic an abnormal appearing lymph nodes in each station. The most solid appearing lymph nodes are present in the right supraclavicular station. The right submental lymph node is slightly more cystic and may not yield viable cells. Therefore, the decision was made to proceed with biopsy of the right supraclavicular station lymph nodes. The right supraclavicular region was prepped with chlorhexidine in a sterile fashion, and a sterile drape was applied covering the operative field. A sterile gown and sterile gloves were used for the procedure. Local anesthesia was provided with 1% Lidocaine. A small dermatotomy was made. Under  real-time sonographic guidance, numerous 18 gauge core biopsies were obtained of several lymph nodes in the right supraclavicular station. Biopsy specimens were placed in saline and delivered to pathology for further analysis. Post biopsy imaging demonstrates no evidence of hematoma or active bleeding. The patient tolerated the procedure well. There was no significant bleeding. COMPLICATIONS: None immediate. FINDINGS: Extensive hypoechoic lymphadenopathy throughout the right neck and right submental region. IMPRESSION: Technically successful ultrasound-guided core biopsy of right supraclavicular lymph nodes. Electronically Signed   By: Jacqulynn Cadet M.D.   On: 11/28/2017 09:04   Ir Fluoro Guide Port Insertion Right  Result Date: 12/17/2017 CLINICAL DATA:  Lymphoma. Needs durable venous access for planned chemotherapy regimen. EXAM: TUNNELED PORT CATHETER PLACEMENT WITH ULTRASOUND AND FLUOROSCOPIC GUIDANCE FLUOROSCOPY TIME:  0.1 minutes  ; 12  uGym2 DAP ANESTHESIA/SEDATION: Intravenous Fentanyl and Versed were administered as conscious sedation during continuous monitoring of the patient's level of consciousness and physiological / cardiorespiratory status by the radiology RN, with a total moderate sedation time of 15 minutes. TECHNIQUE: The procedure, risks, benefits, and alternatives were explained to the patient. Questions regarding the procedure were encouraged and answered. The patient understands and consents to the procedure. As antibiotic prophylaxis, cefazolin 2 g was ordered pre-procedure and administered intravenously within one hour of incision. External compression of the right IJ vein secondary to adenopathy was noted, corresponding to findings from recent CT chest. Patency of the right external jugular vein was identified. Patency of the right EJ vein was confirmed with ultrasound with image documentation. An appropriate skin site was determined. Skin site was marked. Region was prepped using  maximum barrier technique including cap and mask, sterile gown, sterile gloves, large sterile sheet, and Chlorhexidine as cutaneous antisepsis. The region was infiltrated locally with 1% lidocaine. Under real-time ultrasound guidance, the right EJ vein was accessed with a 21 gauge micropuncture  needle; the needle tip within the vein was confirmed with ultrasound image documentation. Needle was exchanged over a 018 guidewire for transitional dilator which allowed passage of the [facility] wire into the IVC. Over this, the transitional dilator was exchanged for a 5 [place] MPA catheter. A small incision was made on the right anterior chest wall and a subcutaneous pocket fashioned. The power-injectable port was positioned and its catheter tunneled to the right EJ dermatotomy site. The MPA catheter was exchanged over an Amplatz wire for a peel-away sheath, through which the port catheter, which had been trimmed to the appropriate length, was advanced and positioned under fluoroscopy with its tip at the cavoatrial junction. Spot chest radiograph confirms good catheter position and no pneumothorax. The pocket was closed with deep interrupted and subcuticular continuous 3-0 Monocryl sutures. The port was flushed per protocol. The incisions were covered with Dermabond then covered with a sterile dressing. COMPLICATIONS: COMPLICATIONS None immediate IMPRESSION: Technically successful right EJ power-injectable port catheter placement. Ready for routine use. Electronically Signed   By: Lucrezia Europe M.D.   On: 12/17/2017 14:29    Assessment: 83 y.o. [place] woman with a diffuse large cell B-cell non-Hodgkin's lymphoma, high-grade, diagnosed by lymph node biopsy December 2018  (1) prednisone started 12/03/2017, continued for 5 days  (a) repeated 12/19/2017  (2) rituximab weekly started 12/04/2017  (a) 2d dose 12/11/2017 w/o event  (b) 3d dose due 12/18/2017--deferred due to patient's poor functional status  (c) 3d dose  12/25/2017 w/o event  (3) to start chemotherapy via port  12/31/2017 at [facility]  (4) no evidence of tumor lysis-- continue allopurinol, follow labs  (5) hypercalcemia--related to NHL; PTH and PTH related peptide not elevated  (a) pamidronate given 12/10/2017  (b) consider denosumab/ prolia dose if hypercalcemia persists  Plan: Alyza has had 3 doses of rituximab with good tolerance; also 2 cycles of steroids w/o evidence of glc intolerance. As far as her lymphoma is concerned, she is scheduled for chemotherapy at the [facility] 01/09. If she is discharged before that date we will arrange for transportation through the SNF. If she is still in house we may need to hold the chemo or change to bendamustine (which would be easier on the IV team).  I am encouraged as she is that there is now some function in the R arm. She has a long way to go towards independence in ADL. Greatly appreciate your help to this patient and your accomodating her rituximab therapy during Rehab!     Bobetta Lime, MD 12/26/2017  7:42 AM Medical Oncology and Hematology [facility] [address] [place], [place] [address] Tel. [phone]    Fax. [phone]

## 2017-12-27 ENCOUNTER — Inpatient Hospital Stay (HOSPITAL_COMMUNITY)
Admission: AD | Admit: 2017-12-27 | Discharge: 2018-01-23 | DRG: 871 | Disposition: E | Payer: Medicare Other | Source: Other Acute Inpatient Hospital | Attending: Pulmonary Disease | Admitting: Pulmonary Disease

## 2017-12-27 ENCOUNTER — Inpatient Hospital Stay (HOSPITAL_COMMUNITY): Payer: Medicare Other | Admitting: Physical Therapy

## 2017-12-27 ENCOUNTER — Inpatient Hospital Stay (HOSPITAL_COMMUNITY): Payer: Medicare Other | Admitting: Occupational Therapy

## 2017-12-27 ENCOUNTER — Other Ambulatory Visit: Payer: Self-pay

## 2017-12-27 ENCOUNTER — Inpatient Hospital Stay (HOSPITAL_COMMUNITY): Payer: Medicare Other

## 2017-12-27 DIAGNOSIS — E785 Hyperlipidemia, unspecified: Secondary | ICD-10-CM | POA: Diagnosis present

## 2017-12-27 DIAGNOSIS — Z90710 Acquired absence of both cervix and uterus: Secondary | ICD-10-CM

## 2017-12-27 DIAGNOSIS — C8590 Non-Hodgkin lymphoma, unspecified, unspecified site: Secondary | ICD-10-CM | POA: Diagnosis not present

## 2017-12-27 DIAGNOSIS — I6529 Occlusion and stenosis of unspecified carotid artery: Secondary | ICD-10-CM | POA: Diagnosis present

## 2017-12-27 DIAGNOSIS — R6521 Severe sepsis with septic shock: Secondary | ICD-10-CM | POA: Diagnosis present

## 2017-12-27 DIAGNOSIS — I5032 Chronic diastolic (congestive) heart failure: Secondary | ICD-10-CM | POA: Diagnosis not present

## 2017-12-27 DIAGNOSIS — E875 Hyperkalemia: Secondary | ICD-10-CM

## 2017-12-27 DIAGNOSIS — E872 Acidosis: Secondary | ICD-10-CM | POA: Diagnosis present

## 2017-12-27 DIAGNOSIS — S143XXS Injury of brachial plexus, sequela: Secondary | ICD-10-CM

## 2017-12-27 DIAGNOSIS — R570 Cardiogenic shock: Secondary | ICD-10-CM | POA: Diagnosis not present

## 2017-12-27 DIAGNOSIS — Z66 Do not resuscitate: Secondary | ICD-10-CM | POA: Diagnosis present

## 2017-12-27 DIAGNOSIS — Z79890 Hormone replacement therapy (postmenopausal): Secondary | ICD-10-CM | POA: Diagnosis not present

## 2017-12-27 DIAGNOSIS — E039 Hypothyroidism, unspecified: Secondary | ICD-10-CM | POA: Diagnosis present

## 2017-12-27 DIAGNOSIS — R5381 Other malaise: Secondary | ICD-10-CM | POA: Diagnosis not present

## 2017-12-27 DIAGNOSIS — G540 Brachial plexus disorders: Secondary | ICD-10-CM | POA: Diagnosis present

## 2017-12-27 DIAGNOSIS — N179 Acute kidney failure, unspecified: Secondary | ICD-10-CM | POA: Diagnosis present

## 2017-12-27 DIAGNOSIS — I4891 Unspecified atrial fibrillation: Secondary | ICD-10-CM | POA: Diagnosis present

## 2017-12-27 DIAGNOSIS — D696 Thrombocytopenia, unspecified: Secondary | ICD-10-CM | POA: Diagnosis present

## 2017-12-27 DIAGNOSIS — Z8673 Personal history of transient ischemic attack (TIA), and cerebral infarction without residual deficits: Secondary | ICD-10-CM

## 2017-12-27 DIAGNOSIS — D62 Acute posthemorrhagic anemia: Secondary | ICD-10-CM | POA: Diagnosis present

## 2017-12-27 DIAGNOSIS — J9601 Acute respiratory failure with hypoxia: Secondary | ICD-10-CM | POA: Diagnosis present

## 2017-12-27 DIAGNOSIS — E871 Hypo-osmolality and hyponatremia: Secondary | ICD-10-CM | POA: Diagnosis present

## 2017-12-27 DIAGNOSIS — Z87891 Personal history of nicotine dependence: Secondary | ICD-10-CM

## 2017-12-27 DIAGNOSIS — C8330 Diffuse large B-cell lymphoma, unspecified site: Secondary | ICD-10-CM | POA: Diagnosis present

## 2017-12-27 DIAGNOSIS — I469 Cardiac arrest, cause unspecified: Secondary | ICD-10-CM | POA: Diagnosis present

## 2017-12-27 DIAGNOSIS — I110 Hypertensive heart disease with heart failure: Secondary | ICD-10-CM | POA: Diagnosis present

## 2017-12-27 DIAGNOSIS — M109 Gout, unspecified: Secondary | ICD-10-CM | POA: Diagnosis present

## 2017-12-27 DIAGNOSIS — I890 Lymphedema, not elsewhere classified: Secondary | ICD-10-CM | POA: Diagnosis present

## 2017-12-27 DIAGNOSIS — Z7901 Long term (current) use of anticoagulants: Secondary | ICD-10-CM | POA: Diagnosis not present

## 2017-12-27 DIAGNOSIS — I5030 Unspecified diastolic (congestive) heart failure: Secondary | ICD-10-CM | POA: Diagnosis present

## 2017-12-27 DIAGNOSIS — K5900 Constipation, unspecified: Secondary | ICD-10-CM | POA: Diagnosis present

## 2017-12-27 DIAGNOSIS — Z9049 Acquired absence of other specified parts of digestive tract: Secondary | ICD-10-CM

## 2017-12-27 DIAGNOSIS — R339 Retention of urine, unspecified: Secondary | ICD-10-CM | POA: Diagnosis present

## 2017-12-27 DIAGNOSIS — A419 Sepsis, unspecified organism: Secondary | ICD-10-CM | POA: Diagnosis present

## 2017-12-27 DIAGNOSIS — B009 Herpesviral infection, unspecified: Secondary | ICD-10-CM | POA: Diagnosis present

## 2017-12-27 LAB — CBC WITH DIFFERENTIAL/PLATELET
BASOS PCT: 0 %
Basophils Absolute: 0 10*3/uL (ref 0.0–0.1)
EOS ABS: 0 10*3/uL (ref 0.0–0.7)
EOS PCT: 0 %
HCT: 31.2 % — ABNORMAL LOW (ref 36.0–46.0)
Hemoglobin: 10 g/dL — ABNORMAL LOW (ref 12.0–15.0)
LYMPHS ABS: 0.8 10*3/uL (ref 0.7–4.0)
Lymphocytes Relative: 6 %
MCH: 34 pg (ref 26.0–34.0)
MCHC: 32.1 g/dL (ref 30.0–36.0)
MCV: 106.1 fL — ABNORMAL HIGH (ref 78.0–100.0)
MONO ABS: 0.5 10*3/uL (ref 0.1–1.0)
Monocytes Relative: 4 %
NEUTROS PCT: 90 %
Neutro Abs: 10.7 10*3/uL — ABNORMAL HIGH (ref 1.7–7.7)
PLATELETS: 181 10*3/uL (ref 150–400)
RBC: 2.94 MIL/uL — ABNORMAL LOW (ref 3.87–5.11)
RDW: 17.3 % — AB (ref 11.5–15.5)
WBC: 11.9 10*3/uL — ABNORMAL HIGH (ref 4.0–10.5)

## 2017-12-27 LAB — BASIC METABOLIC PANEL
Anion gap: 17 — ABNORMAL HIGH (ref 5–15)
BUN: 57 mg/dL — ABNORMAL HIGH (ref 6–20)
CALCIUM: 9 mg/dL (ref 8.9–10.3)
CHLORIDE: 87 mmol/L — AB (ref 101–111)
CO2: 24 mmol/L (ref 22–32)
CREATININE: 1.47 mg/dL — AB (ref 0.44–1.00)
GFR calc Af Amer: 38 mL/min — ABNORMAL LOW (ref >60)
GFR calc non Af Amer: 32 mL/min — ABNORMAL LOW (ref >60)
GLUCOSE: 129 mg/dL — AB (ref 65–99)
Potassium: 5.6 mmol/L — ABNORMAL HIGH (ref 3.5–5.1)
Sodium: 128 mmol/L — ABNORMAL LOW (ref 135–145)

## 2017-12-27 LAB — LACTATE DEHYDROGENASE: LDH: 1255 U/L — AB (ref 98–192)

## 2017-12-27 LAB — URIC ACID: URIC ACID, SERUM: 4.9 mg/dL (ref 2.3–6.6)

## 2017-12-27 LAB — PROTIME-INR
INR: 2.95
PROTHROMBIN TIME: 30.5 s — AB (ref 11.4–15.2)

## 2017-12-27 MED ORDER — VANCOMYCIN HCL IN DEXTROSE 750-5 MG/150ML-% IV SOLN: 750.0000 mg | INTRAVENOUS | Status: DC

## 2017-12-27 MED ORDER — FENTANYL CITRATE (PF) 100 MCG/2ML IJ SOLN: 50.0000 ug | Freq: Once | INTRAMUSCULAR | Status: DC

## 2017-12-27 MED ORDER — MIDAZOLAM HCL 2 MG/2ML IJ SOLN: 1.0000 mg | INTRAMUSCULAR | Status: DC | PRN

## 2017-12-27 MED ORDER — WARFARIN SODIUM 1 MG PO TABS
1.0000 mg | ORAL_TABLET | Freq: Once | ORAL | Status: AC
  Administered 2017-12-27: 1 mg via ORAL
  Filled 2017-12-27: qty 1

## 2017-12-27 MED ORDER — PROPOFOL 1000 MG/100ML IV EMUL: 0.0000 ug/kg/min | INTRAVENOUS | Status: DC

## 2017-12-27 MED ORDER — PHENYLEPHRINE HCL 10 MG/ML IJ SOLN
0.0000 ug/min | INTRAMUSCULAR | Status: DC
  Administered 2017-12-27: 300 ug/min via INTRAVENOUS
  Administered 2017-12-28 (×2): 400 ug/min via INTRAVENOUS
  Administered 2017-12-28: 300 ug/min via INTRAVENOUS
  Administered 2017-12-28 (×2): 400 ug/min via INTRAVENOUS
  Filled 2017-12-27: qty 40
  Filled 2017-12-27: qty 4
  Filled 2017-12-27: qty 40
  Filled 2017-12-27 (×4): qty 4

## 2017-12-27 MED ORDER — FENTANYL CITRATE (PF) 100 MCG/2ML IJ SOLN: 50.0000 ug | INTRAMUSCULAR | Status: DC | PRN

## 2017-12-27 MED ORDER — NOREPINEPHRINE BITARTRATE 1 MG/ML IV SOLN
0.0000 ug/min | INTRAVENOUS | Status: DC
  Administered 2017-12-27: 40 ug/min via INTRAVENOUS
  Filled 2017-12-27: qty 4

## 2017-12-27 MED ORDER — NOREPINEPHRINE BITARTRATE 1 MG/ML IV SOLN
0.0000 ug/min | INTRAVENOUS | Status: DC
  Administered 2017-12-27 – 2017-12-28 (×2): 40 ug/min via INTRAVENOUS
  Filled 2017-12-27 (×3): qty 16

## 2017-12-27 MED ORDER — FENTANYL BOLUS VIA INFUSION
25.0000 ug | INTRAVENOUS | Status: DC | PRN
  Filled 2017-12-27: qty 25

## 2017-12-27 MED ORDER — SODIUM CHLORIDE 0.9 % IV SOLN
0.0000 ug/min | INTRAVENOUS | Status: DC
  Administered 2017-12-27: 200 ug/min via INTRAVENOUS
  Filled 2017-12-27: qty 10

## 2017-12-27 MED ORDER — VASOPRESSIN 20 UNIT/ML IV SOLN
0.0300 [IU]/min | INTRAVENOUS | Status: DC
  Administered 2017-12-27: 0.03 [IU]/min via INTRAVENOUS
  Filled 2017-12-27: qty 2

## 2017-12-27 MED ORDER — VANCOMYCIN HCL 10 G IV SOLR
1500.0000 mg | Freq: Once | INTRAVENOUS | Status: AC
  Administered 2017-12-28: 1500 mg via INTRAVENOUS
  Filled 2017-12-27: qty 1500

## 2017-12-27 MED ORDER — INSULIN ASPART 100 UNIT/ML IV SOLN
10.0000 [IU] | Freq: Once | INTRAVENOUS | Status: AC
  Administered 2017-12-28: 10 [IU] via INTRAVENOUS

## 2017-12-27 MED ORDER — DEXTROSE 50 % IV SOLN
25.0000 g | Freq: Once | INTRAVENOUS | Status: AC
  Administered 2017-12-28: 25 g via INTRAVENOUS

## 2017-12-27 MED ORDER — NALOXONE HCL 0.4 MG/ML IJ SOLN
INTRAMUSCULAR | Status: AC
  Filled 2017-12-27: qty 1

## 2017-12-27 MED ORDER — SODIUM CHLORIDE 0.9 % IV SOLN
250.0000 mL | INTRAVENOUS | Status: DC | PRN
  Administered 2017-12-28: 10 mL via INTRAVENOUS

## 2017-12-27 MED ORDER — FAMOTIDINE IN NACL 20-0.9 MG/50ML-% IV SOLN
20.0000 mg | Freq: Two times a day (BID) | INTRAVENOUS | Status: DC
  Administered 2017-12-28: 20 mg via INTRAVENOUS
  Filled 2017-12-27 (×3): qty 50

## 2017-12-27 MED ORDER — PIPERACILLIN-TAZOBACTAM 3.375 G IVPB
3.3750 g | Freq: Three times a day (TID) | INTRAVENOUS | Status: DC
  Administered 2017-12-28 (×2): 3.375 g via INTRAVENOUS
  Filled 2017-12-27 (×4): qty 50

## 2017-12-27 MED ORDER — SODIUM CHLORIDE 0.9 % IV SOLN
25.0000 ug/h | INTRAVENOUS | Status: DC
  Filled 2017-12-27: qty 50

## 2017-12-27 NOTE — Code Documentation (Signed)
  Patient Name: Colleen [name]   MRN: [id number]   Date of Birth/ Sex: 06-01-1935 , female      Admission Date: 01/21/2018  Attending Provider: Aldean Jewett, MD  Primary Diagnosis: <principal problem not specified>   Indication: Pt was in her usual state of health until this PM, when she was noted to be unresponsive, cyanotic, with agonal respirations. Code blue was subsequently called. At the time of arrival on scene, ACLS protocol was underway.   Technical Description:  - CPR performance duration:  15 minutes  - Was defibrillation or cardioversion used? No   - Was external pacer placed? No  - Was patient intubated pre/post CPR? Yes   Medications Administered: Y = Yes; Blank = No Amiodarone    Atropine    Calcium    Epinephrine  Y  Lidocaine    Magnesium    Norepinephrine    Phenylephrine    Sodium bicarbonate  Y  Vasopressin     Post CPR evaluation:  - Final Status - Was patient successfully resuscitated ? Yes - What is current rhythm? Atrial fibrillation - What is current hemodynamic status? Hypotensive, receiving vasopressin  Miscellaneous Information:  - Labs sent, including: CMP, Mag, Phos, lactic acid, troponin, CBC, ABG, protime-INR  - Primary team notified?  Yes  - Family Notified? Yes  - Additional notes/ transfer status: Transferred to [facility]M for further management     Kathrene Alu, MD  01/08/2018, 11:12 PM

## 2017-12-27 NOTE — Progress Notes (Signed)
Occupational Therapy Session Note  Patient Details  Name: Colleen [name] MRN: [id number] Date of Birth: Nov 17, 1935  Today's Date: 01/15/2018 OT Individual Time:  - 0800-0900  (60 min)      Short Term Goals: Week 1:  OT Short Term Goal 1 (Week 1): Pt will maintain dynamic sitting balance during selfcare tasks with no more than min assist when sitting unsupported. OT Short Term Goal 1 - Progress (Week 1): Not met OT Short Term Goal 2 (Week 1): Pt will complete UB bathing with min assist using LH sponge.  OT Short Term Goal 2 - Progress (Week 1): Not met OT Short Term Goal 3 (Week 1): Pt will perform LB bathing sit to stand with mod assist using AE PRN.  OT Short Term Goal 3 - Progress (Week 1): Not met OT Short Term Goal 4 (Week 1): Pt will toleerate AAROM of the RUE shoulder flexion 0-90 degrees for greater functional use with selfcare tasks.   OT Short Term Goal 4 - Progress (Week 1): Not met OT Short Term Goal 5 (Week 1): Pt will perform toilet transfer with mod assist stand pivot to the 3:1 OT Short Term Goal 5 - Progress (Week 1): Not met Week 2:  OT Short Term Goal 1 (Week 2): Pt will maintain static sitting during selfcare tasks with mod assist.  OT Short Term Goal 1 - Progress (Week 2): Met OT Short Term Goal 2 (Week 2): Pt will perform UB bathing with mod assist using AE PRN. OT Short Term Goal 2 - Progress (Week 2): Met OT Short Term Goal 3 (Week 2): Pt will perform stand pivot toilet transfer to the 3:1 with mod assist.  OT Short Term Goal 3 - Progress (Week 2): Not met OT Short Term Goal 4 (Week 2): Pt will tolerate AAROM of the right shoulder 0-70 degrees for edema control and for greater use with selfcare tasks.   OT Short Term Goal 4 - Progress (Week 2): Not met  Skilled Therapeutic Interventions/Progress Updates:    Addressed sitting balance, RUE AAROM, self bathing.   Pt agreed to sit EOB and perform bathing.  Was leaning throughout session to left.  Provided manual  assistance for upright position and sitting balance.  Pt unable to maintain midline control.  See Function for levels of assist.  Pt scooted on EOB  With max assist.  Performed AAROM to RUE shoulder/scapular area with flex/abed 0-60 before pain at end range.  Went from sitting to supine with max assist.  Ppt able to position self in middle of bed with max cues and mod assist.  Left in bed with all items in reach.    Therapy Documentation Precautions:  Precautions Precautions: Fall, Other (comment) Precaution Comments: Chemo; BP only in LE; incontinent with mobility; R UE pain/lymphedema Restrictions Weight Bearing Restrictions: No General: General Pain: 6/10            See Function Navigator for Current Functional Status.   Therapy/Group: Individual Therapy  Lisa Roca 01/07/2018, 5:47 PM

## 2017-12-27 NOTE — Progress Notes (Signed)
Physical Therapy Session Note  Patient Details  Name: Colleen [name] MRN: [id number] Date of Birth: 06/02/1935  Today's Date: 01/20/2018 PT Individual Time: 1115-1155 PT Individual Time Calculation (min): 40 min  Missed time (min): 45 min   Short Term Goals: Week 3:  PT Short Term Goal 1 (Week 3): Pt will tolerate 15 min of activity in supported sitting w/o increase in fatigue.  PT Short Term Goal 2 (Week 3): Pt will perform HEP LE strengthening exercises w/ min cues. PT Short Term Goal 3 (Week 3): With min cues, pt will verbalize assistance required to reposition in bed and/or chair for pressure relief, neutral upright posture, and comfort.   Skilled Therapeutic Interventions/Progress Updates:   Session 1:  Pt supine upon arrival and agreeable to therapy, no c/o pain. Transferred to EOB w/ max assist and required max assist to maintain static sitting balance at EOB while 2nd helper set-up stedy for transfer. Pt agreeable to sit up in recliner until after lunch at this point this PT had another session w/ pt. Required total assist x2 to stand in stedy and max assist for posture in stedy. Transferred to recliner in stedy and provided max assist to position in recliner for comfort and tolerance to upright sitting. Verbal cues for breathing as pt had increased work of breathing w/ transfer, resolved w/ 5-10 pursed lip breaths. Worked on upright cognitive task at tray table while in recliner requiring matching, sequencing, and attention to task. Max cues for cognitive task and manual assistance at LUE to perform. Ended session in recliner, call bell within reach and all needs met.   Session 2:  Pt in supine upon arrival, NT and RN report they had spent 45 minutes assisting pt back to bed to use bedpan and get cleaned up. Pt extremely fatigued and preacher present, requesting to stay in bed. Missed 45 minutes of skilled PT 2/2 fatigue.   Therapy Documentation Precautions:   Precautions Precautions: Fall, Other (comment) Precaution Comments: Chemo; BP only in LE; incontinent with mobility; R UE pain/lymphedema Restrictions Weight Bearing Restrictions: No  See Function Navigator for Current Functional Status.   Therapy/Group: Individual Therapy  Keshayla Schrum K Arnette 01/15/2018, 1:37 PM

## 2017-12-27 NOTE — Progress Notes (Signed)
[employer] PHYSICAL MEDICINE & REHABILITATION     PROGRESS NOTE  Subjective/Complaints:   No problems overnite, Right arm still swollen but no pain at rest  ROS: Denies CP, nausea, vomiting, diarrhea.  Objective: Vital Signs: Blood pressure (!) 113/40, pulse 80, temperature 98.4 F (36.9 C), temperature source Oral, resp. rate 16, height 5\' 4"  (1.626 m), weight 87.9 kg (193 lb 11.2 oz), SpO2 96 %. No results found. Recent Labs    12/25/17 1823 01/14/2018 0248  WBC 14.7* 11.9*  HGB 11.7* 10.0*  HCT 35.4* 31.2*  PLT 263 181   No results for input(s): NA, K, CL, GLUCOSE, BUN, CREATININE, CALCIUM in the last 72 hours.  Invalid input(s): CO CBG (last 3)  No results for input(s): GLUCAP in the last 72 hours.  Wt Readings from Last 3 Encounters:  01/11/2018 87.9 kg (193 lb 11.2 oz)  12/08/17 88 kg (194 lb 0.1 oz)  11/28/17 73 kg (161 lb)    Physical Exam:  BP (!) 113/40 (BP Location: Left Arm)   Pulse 80   Temp 98.4 F (36.9 C) (Oral)   Resp 16   Ht 5\' 4"  (1.626 m)   Wt 87.9 kg (193 lb 11.2 oz)   SpO2 96%   BMI 33.25 kg/m  Constitutional: NAD. Obese  HENT: Normocephalic. Atraumatic Eyes: EOM are normal. No discharge.  Cardiovascular: RRR. No JVD.  Respiratory: No respiratory distress. Clear to auscultation.  GI: Bowel sounds are normal. She exhibits no distension.  Musculoskeletal: +Edema in all extremities >> LE, was improving, but UE edema increasing again Neurological: She isalertand oriented. Motor: RUE: Shoulder abduction 1/5, elbow flex/extension 2-/5, hand grip 3+/5 (stable, pain inhibition) LUE: 4/5 proximal to distal B/L LE: HF 4/5, KE 4+/5, ADF/PF 4+/5.  Skin. Warm and dry Psychiatric: She has a normal mood and affect. Her behavior is normal. Judgment and thought content normal.   Assessment/Plan: 1. Functional deficits secondary to diffuse large B-cell non-Hodgkin's lymphoma which require 3+ hours per day of interdisciplinary therapy in a comprehensive  inpatient rehab setting. Physiatrist is providing close team supervision and 24 hour management of active medical problems listed below. Physiatrist and rehab team continue to assess barriers to discharge/monitor patient progress toward functional and medical goals.  Function:  Bathing Bathing position   Position: Bed(bedside chair)  Bathing parts Body parts bathed by patient: Chest, Abdomen, Right upper leg, Left upper leg Body parts bathed by helper: Right arm, Left arm, Back, Right lower leg, Left lower leg  Bathing assist Assist Level: (mod A)   Set up : To open containers, To obtain items  Upper Body Dressing/Undressing Upper body dressing   What is the patient wearing?: [place] over shirt/dress - Perfomed by helper: Thread/unthread right sleeve, Thread/unthread left sleeve, Put head through opening, Pull shirt over trunk        Upper body assist Assist Level: Set up      Lower Body Dressing/Undressing Lower body dressing   What is the patient wearing?: Non-skid slipper socks           Non-skid slipper socks- Performed by helper: Don/doff right sock, Don/doff left sock               TED Hose - Performed by helper: Don/doff left TED hose, Don/doff right TED hose  Lower body assist        Toileting Toileting Toileting activity did not occur: No continent bowel/bladder event   Toileting steps  completed by helper: Adjust clothing prior to toileting, Performs perineal hygiene, Adjust clothing after toileting    Toileting assist Assist level: Two helpers   Transfers Chair/bed transfer   Chair/bed transfer method: Stand pivot Chair/bed transfer assist level: Total assist (Pt < 25%) Chair/bed transfer assistive device: Mechanical lift Mechanical lift: Ecologist     Max distance: 4 ft Assist level: Touching or steadying assistance (Pt > 75%)   Wheelchair   Type: Manual Max wheelchair distance: 40' Assist Level:  Supervision or verbal cues  Cognition Comprehension Comprehension assist level: Understands basic 90% of the time/cues < 10% of the time  Expression Expression assist level: Expresses basic needs/ideas: With extra time/assistive device  Social Interaction Social Interaction assist level: Interacts appropriately 75 - 89% of the time - Needs redirection for appropriate language or to initiate interaction.  Problem Solving Problem solving assist level: Solves basic 75 - 89% of the time/requires cueing 10 - 24% of the time  Memory Memory assist level: Recognizes or recalls 75 - 89% of the time/requires cueing 10 - 24% of the time     Medical Problem List and Plan: 1.  Decreased functional mobility secondary to diffuse large B-cell non-Hodgkin's lymphoma.   Continue CIR    Per Heme/Onc, notes reviewed, plan Rituximab tomorrow, second round of steroids on 12/28.  Spoke with Heme/Onc regarding edema and Rituximab infusion.    Plan CHOP with infusional doxorubicin as an outpatient per Heme/Onc 2.  DVT Prophylaxis/Anticoagulation:  Monitor for any bleeding episodes   INR therapeutic on 1/3 3. Pain Management with brachial plexus impingement due to lymphadenopathy, RUE swelling             Neurontin 300 mg 3 times a day, oxycodone as needed 4. Mood: Provide emotional support 5. Neuropsych: This patient is capable of making decisions on her own behalf. 6. Skin/Wound Care: Routine skin checks 7. Fluids/Electrolytes/Nutrition: Routine I&O's  8. Atrial fibrillation. Continue Tambocor 100 mg twice a day, Lopressor 12.5 mg twice a day   Bradycardia d/c lopressor- improved 1/5 9. Hypertension. Monitor with increased mobility   Lisinopril 20 mg daily d/ced on 12/26   Relatively controlled on 1/5 Vitals:   12/26/17 1430 01/15/2018 0454  BP: (!) 116/41 (!) 113/40  Pulse: (!) 58 80  Resp: 17 16  Temp: 98 F (36.7 C) 98.4 F (36.9 C)  SpO2: 96%    10. Diastolic congestive heart failure. Monitor for any  signs of fluid overload. Weigh patient daily Filed Weights   12/25/17 0547 12/26/17 0522 01/14/2018 0454  Weight: 84.9 kg (187 lb 2.7 oz) 87.1 kg (192 lb 0.3 oz) 87.9 kg (193 lb 11.2 oz)    ?Reliability   Last Echo 2016, reordered, completed, relatively stable, with no significant changes 11. Hypothyroidism. Synthroid 12. Gout. Allopurinol 300 mg daily. Monitor for any gout flareups 13. Constipation. Laxative assistance 14. RUE lymphedema: see #3, continue elevation, massage, edema mgt 15. Hypoalbuminemia   Supplement initiated on 12/19 16. Acute blood loss anemia   Hemoglobin 9.6 on 12/28      Continue to monitor 17. 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 Hyponatremia: Resolved   Cont to monitor 20. AKI    Cr 0.86 on 12/31   Labs pending   Hospitalist signed off   Encourage fluids   Will need to  be mindful of tumor lysis syndrome    Cont to monitor 21. Leukocytosis   UA equivocal, urine culture with multiple species   CXR reviewed, showing improvement   Cont to monitor 22. Thrombocytopenia:    Platelets 146 on 12/27   Labs pending   Continue to monitor 23. Postinfusion fluid overload   UE edema has significantly decreased, but LE edema persists     LE dopplers neg for DVT   Lasix increased to BID  24. Urinary retention with incontinence now   Foley inserted by hospitalist with plans for outpt follow up   LOS (Days) 18 A TO FACE EVALUATION WAS PERFORMED  Charlett Blake 01/22/2018 7:41 AM

## 2017-12-27 NOTE — Progress Notes (Signed)
Pharmacy Antibiotic Note  Colleen [name] is a 83 y.o. female 01/20/2018 s/p cardiac arrest, possible sepsis.  Pharmacy has been consulted for Vancomycin and Zosyn dosing.  Plan: Vancomycin 1500 mg IV now, then Vancomycin 750 mg IV q48h Zosyn 3.375 g IV q8h     Temp (24hrs), Avg:98.1 F (36.7 C), Min:97.8 F (36.6 C), Max:98.4 F (36.9 C)  Recent Labs  Lab 12/21/17 0404 12/22/17 0355 12/22/17 0911 12/25/17 1823 01/17/2018 0248 01/20/2018 2200 01/01/2018 2205  WBC  --   --   --  14.7* 11.9*  --   --   CREATININE 1.00 0.86 0.88  --   --  1.47*  --   LATICACIDVEN  --   --   --   --   --   --  11.7*    Estimated Creatinine Clearance: 32.8 mL/min (A) (by C-G formula based on SCr of 1.47 mg/dL (H)).    No Known Allergies    Colleen [name] 01/19/2018 11:22 PM

## 2017-12-27 NOTE — Procedures (Signed)
Intubation Procedure Note LADAWN BOULLION [id number] 10-17-1935  Procedure: Intubation Indications: Respiratory insufficiency  Procedure Details Consent: Risks of procedure as well as the alternatives and risks of each were explained to the (patient/caregiver).  Consent for procedure obtained. and Unable to obtain consent because of emergent medical necessity. Time Out: Verified patient identification, verified procedure, site/side was marked, verified correct patient position, special equipment/implants available, medications/allergies/relevent history reviewed, required imaging and test results available.  Performed  Maximum sterile technique was used including gloves, gown and hand hygiene.  MAC and 4    Evaluation Hemodynamic Status: Transient hypotension treated with fluid; O2 sats: transiently fell during during procedure Patient's Current Condition: unstable Complications: No apparent complications Patient did tolerate procedure well. Chest X-ray ordered to verify placement.  CXR: pending. Code Blue, Patient was intubated for oxygenation and airway protection purposes. B/S equal with positive color change on EZ-Cap. Patient being transported to 10M.  Ulice Dash 12/23/2017

## 2017-12-27 NOTE — H&P (Signed)
PULMONARY / CRITICAL CARE MEDICINE   Name: Colleen [name] MRN: [id number] DOB: 1935/12/09    ADMISSION DATE:  01/15/2018 CONSULTATION DATE:  01/09/2018  REFERRING MD:  hospitalist   CHIEF COMPLAINT:  Cardiac arrest   HISTORY OF PRESENT ILLNESS:   83 yr old lady with known B cell Non hodgkins lymphoma, atrial fibrillation with chronic Coumadin, diastolic congestive heart failure, hypertension, TIA and remote tobacco abuse. Undergoing CHOP chemo/ rituximab coming in from rehab after cardiac arrest. Patient was in PEA for 15-20 mins. As per nursing she asked for pain medicine earlier prior to the start of the shift. RRt was called and patient was agonal then had an arrest. Came to ICU intubated unresponsive severely hypotensive. Obviously history could not be obtained due to patient altered mental status./   Husband arrived and further history was obtained from him. He did confirm she is full code.    PAST MEDICAL HISTORY :  She  has a past medical history of Atrial fibrillation ([place]), Carotid artery disease ([place]), CHF (congestive heart failure) ([place]), Hyperlipidemia, Hypertension, Hypothyroidism, Shortness of breath, and TIA (transient ischemic attack) (2001).  PAST SURGICAL HISTORY: She  has a past surgical history that includes Cholecystectomy; Abdominal hysterectomy (1975); Carotid endarterectomy (2001); doppler echocardiography (02/13/2010); doppler echocardiography (09/29/2007); NM MYOVIEW LTD (02/13/2010); carotid duplex (11/24/2012); carotid duplex (11/01/2010); renal duplex (02/13/2010); Video bronchoscopy (Bilateral, 08/09/2015); Cardiac catheterization (N/A, 08/14/2015); IR US Guide Vasc Access Right (12/17/2017); and IR FLUORO GUIDE PORT INSERTION RIGHT (12/17/2017).  No Known Allergies  No current facility-administered medications on file prior to encounter.    Current Outpatient Medications on File Prior to Encounter  Medication Sig  . acetaminophen (TYLENOL) 500 MG tablet Take 1,000  mg by mouth every 6 (six) hours as needed for headache (pain).  Marland Kitchen allopurinol (ZYLOPRIM) 300 MG tablet Take 1 tablet (300 mg total) by mouth daily.  Marland Kitchen Dextrose-Sodium Chloride (DEXTROSE 5 % AND 0.9% NACL) 5-0.9 % infusion Inject 75 mL/hr into the vein continuous.  . flecainide (TAMBOCOR) 100 MG tablet Take 1 tablet (100 mg total) by mouth 2 (two) times daily.  Marland Kitchen gabapentin (NEURONTIN) 300 MG capsule Take 1 capsule (300 mg total) by mouth 3 (three) times daily.  Marland Kitchen levothyroxine (SYNTHROID, LEVOTHROID) 88 MCG tablet Take 88 mcg by mouth daily before breakfast.   . lisinopril (PRINIVIL,ZESTRIL) 20 MG tablet Take 1 tablet (20 mg total) by mouth 2 (two) times daily. (Patient taking differently: Take 20 mg by mouth daily. )  . metoprolol tartrate (LOPRESSOR) 25 MG tablet Take 0.5 tablets (12.5 mg total) by mouth 2 (two) times daily.  Marland Kitchen oxyCODONE 10 MG TABS Take 1 tablet (10 mg total) by mouth every 4 (four) hours as needed for moderate pain.  Marland Kitchen senna-docusate (SENOKOT-S) 8.6-50 MG tablet Take 1 tablet by mouth at bedtime.  Marland Kitchen warfarin (COUMADIN) 5 MG tablet Take 2.5-5 mg by mouth See admin instructions. Take 1 tablet (5 mg) by mouth on Monday and Friday after supper, take 1/2 tablet (2.5 mg) on Sunday, Tuesday, Wednesday, Thursday, Saturday with supper    FAMILY HISTORY:  As per note  Her indicated that the status of her mother is unknown. She indicated that the status of her sister is unknown. She indicated that the status of her brother is unknown. She indicated that the status of her neg hx is unknown.   SOCIAL HISTORY: As per note  She  reports that she quit smoking about 18 years ago. Her smoking use included cigarettes. She  has a 47.00 pack-year smoking history. she has never used smokeless tobacco. She reports that she drinks alcohol. She reports that she does not use drugs.  REVIEW OF SYSTEMS:   Could not be obtained due to altered mental status     VITAL SIGNS: Pulse 75   Resp (!) 27    HEMODYNAMICS:    VENTILATOR SETTINGS: Vent Mode: PRVC FiO2 (%):  [100 %] 100 % Set Rate:  [20 bmp] 20 bmp Vt Set:  [420 mL] 420 mL PEEP:  [5 cmH20] 5 cmH20  INTAKE / OUTPUT: No intake/output data recorded.  PHYSICAL EXAMINATION: General:  Looks in severe distress intubated  Neuro:  pupild sluggish with upward gaze  HEENT: intubated  Cardiovascular: normal heart sounds no murmurs  Lungs: clear equal air sounds no murmurs   Abdomen:  Soft no tenderness no organomegally  Musculoskeletal:  +++ edema  Skin:  Thin skin   LABS:  BMET Recent Labs  Lab 12/22/17 0355 12/22/17 0911 01/01/2018 2200  NA 136 136 128*  K 4.7 4.6 5.6*  CL 95* 97* 87*  CO2 28 28 24   BUN 44* 42* 57*  CREATININE 0.86 0.88 1.47*  GLUCOSE 101* 78 129*    Electrolytes Recent Labs  Lab 12/22/17 0355 12/22/17 0911 12/30/2017 2200  CALCIUM 10.5* 10.9* 9.0    CBC Recent Labs  Lab 12/25/17 1823 01/09/2018 0248  WBC 14.7* 11.9*  HGB 11.7* 10.0*  HCT 35.4* 31.2*  PLT 263 181    Coag's Recent Labs  Lab 12/25/17 0414 12/26/17 0352 01/20/2018 0248  INR 2.69 3.01 2.95    Sepsis Markers No results for input(s): LATICACIDVEN, PROCALCITON, O2SATVEN in the last 168 hours.  ABG No results for input(s): PHART, PCO2ART, PO2ART in the last 168 hours.  Liver Enzymes No results for input(s): AST, ALT, ALKPHOS, BILITOT, ALBUMIN in the last 168 hours.  Cardiac Enzymes No results for input(s): TROPONINI, PROBNP in the last 168 hours.  Glucose No results for input(s): GLUCAP in the last 168 hours.  Imaging No results found.    LINES/TUBES: Intubated 01/05/2018     ASSESSMENT / PLAN:  Post cardiac arrest Cardiogenic shock Suspected septic shock Hyponatremia  Hyperkalemia  Non hodgkin's lymphoma undergoing chemo AKI Severe lactic acidosis Acute hypoxemic respiratory failure requiring mechanical ventilation   Plan: - levophed, phenylephrine vasopressin - IVf boluses - Echo -  cardiac enzymes - discussed code status FULL code  - start empiric ABx - cultures  - insulin D50c CaCl  - stat CT head and CTA chest when more stable  Hold anticoagulation for now - PPI    I have spent 45 mins of CC time bedside ro in the unit exclusive of any billable procedures      FAMILY  - Updates: husband updated   - Inter-disciplinary family meet or Palliative Care meeting due by:  01/01/2018    Pulmonary and [place] Pager: [phone]  01/14/2018, 11:02 PM

## 2017-12-27 NOTE — Progress Notes (Signed)
The writer went to the patient's room for head-to-toe assessment and saw pt with head tilted to the right and foaming. The head of the bed was immediately raised, and the other nurse was called to the room.  When patient's name was called, she faintly answered one time and became unresponsive. Rapid response was called as this time as the patient still has a pulse. At the time, patient had already taken all her bedtime medications from the other nurse. The Rapid Response RN came in the room within few minutes while the MD and the family members were notified. Code was called and  CPR was started when the patient became pulseless. Pt was intubated in her room and was transferred to 2 Medical ICU.

## 2017-12-27 NOTE — Progress Notes (Signed)
[place] for Warfarin Indication: atrial fibrillation  No Known Allergies  Patient Measurements: Height: (mistaken entry) Weight: 193 lb 11.2 oz (87.9 kg) IBW/kg (Calculated) : 54.7   Vital Signs: Temp: 98.4 F (36.9 C) (01/05 0454) Temp Source: Oral (01/05 0454) BP: 113/40 (01/05 0454) Pulse Rate: 80 (01/05 0454)  Labs: Recent Labs    12/25/17 0414 12/25/17 1823 12/26/17 0352 01/09/2018 0248  HGB  --  11.7*  --  10.0*  HCT  --  35.4*  --  31.2*  PLT  --  263  --  181  LABPROT 28.4*  --  31.0* 30.5*  INR 2.69  --  3.01 2.95   Estimated Creatinine Clearance: 54.7 mL/min (by C-G formula based on SCr of 0.88 mg/dL).  Assessment: 83 y/o female admitted with lymphadenopathy requiring biopsy - diagnosed with diffuse large B-cell non Hodgkin's Lymphoma. She is on chronic warfarin for history of afib. INR is therapeutic today at 2.95. PO intake low. No bleeding noted.  PTA warfarin dose: 2.5mg  daily except 5mg  MF (INR 1.91 on 12/7)   Goal of Therapy:  INR 2-3 Monitor platelets by anticoagulation protocol: Yes   Plan:  Warfarin 1 mg PO x1 Daily INR Monitor for s/sx bleeding  Leroy Libman, PharmD Pharmacy Resident Pager: [phone]

## 2017-12-28 ENCOUNTER — Encounter (HOSPITAL_COMMUNITY): Payer: Self-pay | Admitting: *Deleted

## 2017-12-28 ENCOUNTER — Inpatient Hospital Stay (HOSPITAL_COMMUNITY): Payer: Medicare Other | Admitting: Physical Therapy

## 2017-12-28 ENCOUNTER — Inpatient Hospital Stay (HOSPITAL_COMMUNITY): Payer: Medicare Other

## 2017-12-28 ENCOUNTER — Other Ambulatory Visit: Payer: Self-pay

## 2017-12-28 ENCOUNTER — Other Ambulatory Visit (HOSPITAL_COMMUNITY): Payer: Medicare Other

## 2017-12-28 LAB — CBC WITH DIFFERENTIAL/PLATELET
BASOS ABS: 0.3 10*3/uL — AB (ref 0.0–0.1)
Basophils Relative: 2 %
EOS PCT: 0 %
Eosinophils Absolute: 0 10*3/uL (ref 0.0–0.7)
HEMATOCRIT: 33 % — AB (ref 36.0–46.0)
HEMOGLOBIN: 10.2 g/dL — AB (ref 12.0–15.0)
LYMPHS ABS: 0.4 10*3/uL — AB (ref 0.7–4.0)
LYMPHS PCT: 3 %
MCH: 34.1 pg — ABNORMAL HIGH (ref 26.0–34.0)
MCHC: 30.9 g/dL (ref 30.0–36.0)
MCV: 110.4 fL — ABNORMAL HIGH (ref 78.0–100.0)
MONOS PCT: 4 %
Monocytes Absolute: 0.6 10*3/uL (ref 0.1–1.0)
NEUTROS PCT: 91 %
Neutro Abs: 12.7 10*3/uL — ABNORMAL HIGH (ref 1.7–7.7)
Platelets: ADEQUATE 10*3/uL (ref 150–400)
RBC: 2.99 MIL/uL — AB (ref 3.87–5.11)
RDW: 17.4 % — ABNORMAL HIGH (ref 11.5–15.5)
WBC: 14 10*3/uL — AB (ref 4.0–10.5)

## 2017-12-28 LAB — COMPREHENSIVE METABOLIC PANEL
ALT: 78 U/L — AB (ref 14–54)
ANION GAP: 16 — AB (ref 5–15)
AST: 226 U/L — ABNORMAL HIGH (ref 15–41)
Albumin: 1.7 g/dL — ABNORMAL LOW (ref 3.5–5.0)
Alkaline Phosphatase: 277 U/L — ABNORMAL HIGH (ref 38–126)
BUN: 56 mg/dL — ABNORMAL HIGH (ref 6–20)
CHLORIDE: 96 mmol/L — AB (ref 101–111)
CO2: 21 mmol/L — ABNORMAL LOW (ref 22–32)
Calcium: 8.6 mg/dL — ABNORMAL LOW (ref 8.9–10.3)
Creatinine, Ser: 1.68 mg/dL — ABNORMAL HIGH (ref 0.44–1.00)
GFR, EST AFRICAN AMERICAN: 32 mL/min — AB (ref >60)
GFR, EST NON AFRICAN AMERICAN: 28 mL/min — AB (ref >60)
Glucose, Bld: 165 mg/dL — ABNORMAL HIGH (ref 65–99)
POTASSIUM: 4.7 mmol/L (ref 3.5–5.1)
SODIUM: 133 mmol/L — AB (ref 135–145)
Total Bilirubin: 2.2 mg/dL — ABNORMAL HIGH (ref 0.3–1.2)
Total Protein: 3.8 g/dL — ABNORMAL LOW (ref 6.5–8.1)

## 2017-12-28 LAB — BLOOD CULTURE ID PANEL (REFLEXED)
ACINETOBACTER BAUMANNII: NOT DETECTED
CANDIDA ALBICANS: NOT DETECTED
CANDIDA PARAPSILOSIS: NOT DETECTED
CANDIDA TROPICALIS: NOT DETECTED
CARBAPENEM RESISTANCE: NOT DETECTED
Candida glabrata: NOT DETECTED
Candida krusei: NOT DETECTED
ENTEROBACTER CLOACAE COMPLEX: NOT DETECTED
Enterobacteriaceae species: DETECTED — AB
Enterococcus species: NOT DETECTED
Escherichia coli: DETECTED — AB
HAEMOPHILUS INFLUENZAE: NOT DETECTED
KLEBSIELLA PNEUMONIAE: NOT DETECTED
Klebsiella oxytoca: NOT DETECTED
Listeria monocytogenes: NOT DETECTED
NEISSERIA MENINGITIDIS: NOT DETECTED
PROTEUS SPECIES: NOT DETECTED
Pseudomonas aeruginosa: NOT DETECTED
SERRATIA MARCESCENS: NOT DETECTED
STAPHYLOCOCCUS SPECIES: NOT DETECTED
STREPTOCOCCUS AGALACTIAE: NOT DETECTED
STREPTOCOCCUS SPECIES: NOT DETECTED
Staphylococcus aureus (BCID): NOT DETECTED
Streptococcus pneumoniae: NOT DETECTED
Streptococcus pyogenes: NOT DETECTED

## 2017-12-28 LAB — BASIC METABOLIC PANEL
ANION GAP: 22 — AB (ref 5–15)
BUN: 58 mg/dL — ABNORMAL HIGH (ref 6–20)
CHLORIDE: 90 mmol/L — AB (ref 101–111)
CO2: 19 mmol/L — AB (ref 22–32)
Calcium: 8.3 mg/dL — ABNORMAL LOW (ref 8.9–10.3)
Creatinine, Ser: 1.78 mg/dL — ABNORMAL HIGH (ref 0.44–1.00)
GFR calc non Af Amer: 26 mL/min — ABNORMAL LOW (ref >60)
GFR, EST AFRICAN AMERICAN: 30 mL/min — AB (ref >60)
GLUCOSE: 91 mg/dL (ref 65–99)
Potassium: 5.5 mmol/L — ABNORMAL HIGH (ref 3.5–5.1)
Sodium: 131 mmol/L — ABNORMAL LOW (ref 135–145)

## 2017-12-28 LAB — BLOOD GAS, ARTERIAL
ACID-BASE DEFICIT: 11.3 mmol/L — AB (ref 0.0–2.0)
BICARBONATE: 15.4 mmol/L — AB (ref 20.0–28.0)
Drawn by: 252031
FIO2: 100
LHR: 20 {breaths}/min
O2 Saturation: 92 %
PATIENT TEMPERATURE: 98.6
PEEP/CPAP: 5 cmH2O
PO2 ART: 79 mmHg — AB (ref 83.0–108.0)
VT: 420 mL
pCO2 arterial: 42 mmHg (ref 32.0–48.0)
pH, Arterial: 7.19 — CL (ref 7.350–7.450)

## 2017-12-28 LAB — PROTIME-INR
INR: 4.79 — AB
PROTHROMBIN TIME: 44.6 s — AB (ref 11.4–15.2)

## 2017-12-28 LAB — TROPONIN I
TROPONIN I: 1.13 ng/mL — AB (ref <0.03)
Troponin I: 2.58 ng/mL (ref <0.03)

## 2017-12-28 LAB — TRIGLYCERIDES: TRIGLYCERIDES: 96 mg/dL (ref <150)

## 2017-12-28 LAB — PROCALCITONIN: Procalcitonin: 57.98 ng/mL

## 2017-12-28 LAB — MAGNESIUM
Magnesium: 1.4 mg/dL — ABNORMAL LOW (ref 1.7–2.4)
Magnesium: 1.7 mg/dL (ref 1.7–2.4)

## 2017-12-28 LAB — LACTIC ACID, PLASMA
Lactic Acid, Venous: 11.7 mmol/L (ref 0.5–1.9)
Lactic Acid, Venous: 11.4 mmol/L (ref 0.5–1.9)

## 2017-12-28 LAB — CK: CK TOTAL: 24 U/L — AB (ref 38–234)

## 2017-12-28 LAB — MRSA PCR SCREENING: MRSA BY PCR: NEGATIVE

## 2017-12-28 MED ORDER — ATROPINE SULFATE 1 MG/10ML IJ SOSY
1.0000 mg | PREFILLED_SYRINGE | Freq: Once | INTRAMUSCULAR | Status: AC
  Administered 2017-12-28: 0.5 mg via INTRAVENOUS

## 2017-12-28 MED ORDER — PANTOPRAZOLE SODIUM 40 MG IV SOLR: 40.0000 mg | INTRAVENOUS | Status: DC

## 2017-12-28 MED ORDER — SODIUM CHLORIDE 0.9 % IV SOLN
1.0000 g | Freq: Once | INTRAVENOUS | Status: DC
  Filled 2017-12-28: qty 10

## 2017-12-28 MED ORDER — SODIUM CHLORIDE 0.9 % IV SOLN: 1.0000 g | Freq: Once | INTRAVENOUS | Status: DC

## 2017-12-28 MED ORDER — SODIUM BICARBONATE 8.4 % IV SOLN
100.0000 meq | Freq: Once | INTRAVENOUS | Status: AC
  Administered 2017-12-27: 100 meq via INTRAVENOUS

## 2017-12-28 MED ORDER — DEXTROSE 50 % IV SOLN
INTRAVENOUS | Status: AC
  Administered 2017-12-28: 25 g via INTRAVENOUS
  Filled 2017-12-28: qty 50

## 2017-12-28 MED ORDER — CALCIUM CHLORIDE 10 % IV SOLN
1.0000 g | Freq: Once | INTRAVENOUS | Status: AC
  Administered 2017-12-28: 1 g via INTRAVENOUS
  Filled 2017-12-28: qty 10

## 2017-12-28 MED ORDER — ATROPINE SULFATE 1 MG/10ML IJ SOSY
1.0000 mg | PREFILLED_SYRINGE | Freq: Once | INTRAMUSCULAR | Status: AC
  Administered 2017-12-28: 1 mg via INTRAVENOUS

## 2017-12-28 MED ORDER — SODIUM BICARBONATE 8.4 % IV SOLN
100.0000 meq | Freq: Once | INTRAVENOUS | Status: AC
  Administered 2017-12-28: 100 meq via INTRAVENOUS

## 2017-12-28 MED ORDER — SODIUM BICARBONATE 8.4 % IV SOLN
INTRAVENOUS | Status: DC
  Administered 2017-12-28: 05:00:00 via INTRAVENOUS
  Filled 2017-12-28: qty 150

## 2017-12-28 MED ORDER — ORAL CARE MOUTH RINSE
15.0000 mL | Freq: Four times a day (QID) | OROMUCOSAL | Status: DC
  Administered 2017-12-28: 15 mL via OROMUCOSAL

## 2017-12-28 MED ORDER — CHLORHEXIDINE GLUCONATE 0.12% ORAL RINSE (MEDLINE KIT)
15.0000 mL | Freq: Two times a day (BID) | OROMUCOSAL | Status: DC
  Administered 2017-12-28 (×2): 15 mL via OROMUCOSAL

## 2017-12-28 MED ORDER — SODIUM BICARBONATE 8.4 % IV SOLN
INTRAVENOUS | Status: AC
  Administered 2017-12-28: 100 meq
  Filled 2017-12-28: qty 100

## 2017-12-28 MED ORDER — MAGNESIUM SULFATE 2 GM/50ML IV SOLN
2.0000 g | Freq: Once | INTRAVENOUS | Status: AC
  Administered 2017-12-28: 2 g via INTRAVENOUS
  Filled 2017-12-28: qty 50

## 2017-12-28 MED ORDER — SODIUM CHLORIDE 0.9 % IV BOLUS (SEPSIS)
3000.0000 mL | Freq: Once | INTRAVENOUS | Status: AC
  Administered 2017-12-28: 1000 mL via INTRAVENOUS

## 2017-12-28 MED ORDER — CALCIUM CHLORIDE 10 % IV SOLN
1.0000 g | Freq: Once | INTRAVENOUS | Status: AC
  Administered 2017-12-27: 1 g via INTRAVENOUS

## 2017-12-28 MED FILL — Medication: Qty: 1 | Status: AC

## 2017-12-29 ENCOUNTER — Inpatient Hospital Stay (HOSPITAL_COMMUNITY): Payer: Medicare Other

## 2017-12-29 ENCOUNTER — Inpatient Hospital Stay (HOSPITAL_COMMUNITY): Payer: Medicare Other | Admitting: Occupational Therapy

## 2017-12-29 LAB — POCT I-STAT 3, ART BLOOD GAS (G3+)
ACID-BASE EXCESS: 4 mmol/L — AB (ref 0.0–2.0)
Bicarbonate: 30.7 mmol/L — ABNORMAL HIGH (ref 20.0–28.0)
O2 SAT: 95 %
PH ART: 7.354 (ref 7.350–7.450)
Patient temperature: 98.6
TCO2: 32 mmol/L (ref 22–32)
pCO2 arterial: 55.1 mmHg — ABNORMAL HIGH (ref 32.0–48.0)
pO2, Arterial: 81 mmHg — ABNORMAL LOW (ref 83.0–108.0)

## 2017-12-29 LAB — GLUCOSE, CAPILLARY
GLUCOSE-CAPILLARY: 134 mg/dL — AB (ref 65–99)
Glucose-Capillary: 146 mg/dL — ABNORMAL HIGH (ref 65–99)

## 2017-12-29 NOTE — Progress Notes (Signed)
Social Work Discharge Note  The overall goal for the admission was met for:   Discharge location: No - pt transferred to acute after cardiac arrest on CIR and later expired in ICU  Length of Stay: No - 19 days  Discharge activity level: No - pt was max A  Home/community participation: No  Services provided included: MD, RD, PT, OT, RN, Pharmacy, Neuropsych and SW  Financial Services: Private Insurance: [facility] Medicare  Follow-up services arranged: Other: none - pt expired  Comments (or additional information):    Patient/Family verbalized understanding of follow-up arrangements: Other : pt transferred off CIR and then expired  Individual responsible for coordination of the follow-up plan: pt's husband to make arrangements  Confirmed correct DME delivered: Trey Sailors 12/29/2017    Colleen [name], Colleen [name]

## 2017-12-30 ENCOUNTER — Telehealth: Payer: Self-pay

## 2017-12-30 LAB — CULTURE, BLOOD (ROUTINE X 2): SPECIAL REQUESTS: ADEQUATE

## 2017-12-30 NOTE — Telephone Encounter (Signed)
On 12/30/17 I received a d/c from [employer] (original). The d/c is for cremation. The patient is a patient of Doctor Elsworth Soho. The d/c will be taken to [facility] ([address]) this pm for signature.  On 01/01/18 I received the d/c back from Doctor Elsworth Soho. I got the d/c ready and called the funeral home to let them know the d/c is ready for pickup. I also faxed a copy to the funeral home per the funeral home request.

## 2017-12-31 ENCOUNTER — Other Ambulatory Visit: Payer: Medicare Other

## 2017-12-31 ENCOUNTER — Ambulatory Visit: Payer: Medicare Other | Admitting: Adult Health

## 2017-12-31 ENCOUNTER — Ambulatory Visit: Payer: Medicare Other

## 2018-01-02 LAB — CULTURE, BLOOD (ROUTINE X 2)
CULTURE: NO GROWTH
Special Requests: ADEQUATE

## 2018-01-07 ENCOUNTER — Ambulatory Visit: Payer: Medicare Other | Admitting: Adult Health

## 2018-01-10 ENCOUNTER — Other Ambulatory Visit: Payer: Self-pay | Admitting: Nurse Practitioner

## 2018-01-23 NOTE — Discharge Summary (Signed)
[name], [name]                ACCOUNT NO.:  [id number]  MEDICAL RECORD NO.:  [id number]  LOCATION:                                 FACILITY:  PHYSICIAN:  Delice Lesch, MD        DATE OF BIRTH:  [birth year]  DATE OF ADMISSION:  12/09/2017 DATE OF DISCHARGE:  12/31/2017                              DISCHARGE SUMMARY   DISCHARGE DIAGNOSES: 1. Diffuse large B-cell non-Hodgkin lymphoma. 2. Pain management. 3. Atrial fibrillation. 4. Hypertension. 5. Diastolic congestive heart failure. 6. Hypothyroidism. 7. Gout. 8. Constipation. 9. Right upper extremity lymphedema. 10.Decreased nutritional storage. 11.Acute blood loss anemia. 12.Hypercalcemia. 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 14.Hyponatremia. 15.Acute kidney injury. 16.Leukocytosis. 17.Thrombocytopenia. 18.Post infusion fluid overload. 19.Urinary retention.  HISTORY OF PRESENT ILLNESS:  This is an 83 year old right-handed female with history of atrial fibrillation, on chronic Coumadin, diastolic congestive heart failure, hypertension.  Lives with spouse and son, independent prior to admission.  Multiple level home.  Presented on November 29, 2017, with complaints of right arm pain, swelling as well as recent reports of a mass on her right neck.  CT of the chest showed significant adenopathy bilaterally.  MRI soft tissue of the neck showed bulky cervical and included chest lymph adenopathy with right brachial plexus impingement.  Lymph node biopsy on December 02, 2017, likely diffuse large B-cell non-Hodgkin lymphoma.  Hematology consulted, placed on prednisone, allopurinol as well as weekly Rituxan, initiated on December 04, 2017.  Plan was to receive CHOP with infusional Doxorubicin per Hematology/Oncology Services, chronic Coumadin ongoing, physical and occupational therapy.  The patient was admitted for comprehensive rehab program.  PAST MEDICAL HISTORY:  See discharge diagnoses.  SOCIAL HISTORY:  Lives with spouse  and son.  Independent prior to admission.  Functional status upon admission to rehab services was minimal assist, 10 feet, rolling walker; minimal assist, stand, pivot transfers; mod max assist, activities of daily living.  PHYSICAL EXAMINATION:  VITAL SIGNS:  Blood pressure 175/103, pulse 59, temperature 97, and respirations 16. GENERAL:  Alert female. HEENT:  EOMs intact. NECK:  Supple.  Nontender no JVD. CARDIAC:  Rate controlled. ABDOMEN:  Soft, nontender.  Good bowel sounds. LUNGS:  Fair inspiratory effort.  Clear to auscultation. NEUROLOGIC:  Motor strength 4-/5, right upper extremity, hand and grip, shoulder, elbow limited by pain.  Left upper extremity 5/5, distal, proximal to distal.  Bilateral lower extremities, 4-/5 hip flexors, 4/5 knee extension.  REHABILITATION HOSPITAL COURSE:  The patient was admitted to Inpatient Rehab Services.  Therapies initiated on a 3-hour daily basis consisting of physical therapy, occupational therapy, and rehabilitation nursing. The following issues were addressed during the patient's rehabilitation stay.  Pertaining to Colleen [name]'s diffuse large B-cell non-Hodgkin lymphoma.  She is followed closely by Hematology Services receiving her rituximab as advised.  Therapies slow to initiate due to multiple medical issues.  Chronic Coumadin as advised.  Pain management with brachial plexus impingement due to lymphadenopathy.  She remained on Neurontin scheduled as well as oxycodone as needed.  Blood pressures controlled.  She was on Tambocor as well as Lopressor.  Lopressor later discontinued due to some bradycardia.  Monitoring of orthostasis. Diastolic congestive heart  failure.  Close monitoring of fluid overload. Receiving diuresis as advised.  Allopurinol ongoing for episodes of gout.  Acute blood loss anemia, stable at 9.6.  AKI, latest creatinine 0.86.  Close monitoring of chemistries.  Hospitalist remained followup for multiple medical  issues.  The patient received weekly collaborative interdisciplinary team conferences to discuss estimated length of stay, family teaching, any barriers to her discharge.  She transferred to edge of bed with max assist, required max assist to maintain static sitting balance.  Working with energy conservation techniques required total assist x2 to stand and steady max assist for posturing.  Activities of daily living and homemaking, scoot to edge of bed with max assist. Perform active range of motion, right upper extremity, again working with energy conservation techniques.  On December 27, 2016 went into cardiac arrest.  Code was called.  The patient was in PEA for 15-20 minutes.  Critical Care followup.  She was discharged to the intensive care unit.  Required intubation, unresponsive, severely hypotensive. Husband and family contacted.  Lactic acid of 11.4, troponin 2.58, creatinine 1.78.  WBC 14,000, hemoglobin 10.2.  The patient's condition critical.  All medication changes made as per Medical Team.     Lauraine Rinne, P.A.   ______________________________ Delice Lesch, MD    DA/MEDQ  D:  12/29/2017  T:  12/29/2017  Job:  [id number]  cc:   Delice Lesch, MD

## 2018-01-23 NOTE — Progress Notes (Signed)
CRITICAL VALUE ALERT  Critical Value:  Troponin  Date & Time Notied:  0613 01-25-18  Provider Notified: Dr. Baltazar Apo  Orders Received/Actions taken: none

## 2018-01-23 NOTE — Progress Notes (Signed)
Patient surrounded by family as patient died.  Gave family some time. Their pastor was not available during church time.  Had prayer with family and they shared she like song [place] the [place] so I sang and invited them to join me in singing together. Conard Novak, Chaplain   01/21/2018 1100  Clinical Encounter Type  Visited With Patient and family together  Visit Type Death  Referral From Nurse  Consult/Referral To Chaplain  Spiritual Encounters  Spiritual Needs Prayer;Emotional;Grief support;Other (Comment) (sang In the [place])

## 2018-01-23 NOTE — Progress Notes (Signed)
CRITICAL VALUE ALERT  Critical Value:  INR  Date & Time Notied: 0328 2018-01-17  Provider Notified:  Dr. Baltazar Apo  Orders Received/Actions taken: none

## 2018-01-23 NOTE — Discharge Summary (Signed)
Discharge summary job [id number]

## 2018-01-23 NOTE — Discharge Summary (Deleted)
  The note originally documented on this encounter has been moved the the encounter in which it belongs.  

## 2018-01-23 NOTE — Death Summary Note (Signed)
PULMONARY / CRITICAL CARE MEDICINE   Name: Colleen [name] MRN: [id number] DOB: [birth year]    ADMISSION DATE:  01/19/2018 CONSULTATION DATE:  01/04/2018  REFERRING MD:  Hospitalist   CHIEF COMPLAINT:  Cardiac arrest  HISTORY OF PRESENT ILLNESS:   79yro femal with known B-cell non hodkins lymphoma, A-fib w/ chronic coumadin, HFpEF, HTN, previous TIA, and remote tobacco use. Undergoing CHOP chemo w/ rituximab presented from rehab with PEA arrest for >20 minutes. Severely hypotensive, unresponsive following code. Intubated for airway maintenance due to acute respiratory failure.   Patient is now DNR given severity of illness.     REVIEW OF SYSTEMS:   Unable to perform as she is sedated.  SUBJECTIVE:  As above   VITAL SIGNS: BP (!) 106/96   Pulse 95   Temp (!) 102.9 F (39.4 C) (Oral)   Resp (!) 21   Ht 5\' 4"  (1.626 m)   Wt 217 lb 6 oz (98.6 kg)   SpO2 96%   BMI 37.31 kg/m   HEMODYNAMICS:    VENTILATOR SETTINGS: Vent Mode: PRVC FiO2 (%):  [100 %] 100 % Set Rate:  [20 bmp-30 bmp] 30 bmp Vt Set:  [420 mL] 420 mL PEEP:  [5 cmH20] 5 cmH20 Plateau Pressure:  [15 cmH20] 15 cmH20  INTAKE / OUTPUT: I/O last 3 completed shifts: In: 5618.1 [I.V.:1955.6; IV Piggyback:3662.5] Out: 175 [Urine:175]  PHYSICAL EXAMINATION: General:  obtunded, severely edematous, cold to touch, clammy and diaphoretic  Neuro:  unresponsive  Cardiovascular:  Distant heart sounds, S1-S2 difficult to ascertain Lungs:  Crackles in bilateral lung fields, breath sounds present bilaterally upper and lower Abdomen:  Soft, distended, bowel sounds present but decreased Musculoskeletal:  4+ pitting edema bilateral distant lower extremity  Skin:  Cool to touch, clammy, multiple ecchymosis of the left upper distal extremity   LABS:  BMET Recent Labs  Lab 01/05/2018 2200 01-21-18 0059 01/21/18 0457  NA 128* 133* 131*  K 5.6* 4.7 5.5*  CL 87* 96* 90*  CO2 24 21* 19*  BUN 57* 56* 58*  CREATININE 1.47*  1.68* 1.78*  GLUCOSE 129* 165* 91    Electrolytes Recent Labs  Lab 01/02/2018 2200 01/13/2018 2314 01-21-18 0059 01/21/18 0457  CALCIUM 9.0  --  8.6* 8.3*  MG  --  1.4*  --  1.7    CBC Recent Labs  Lab 12/25/17 1823 01/20/2018 0248 2018/01/21 0059  WBC 14.7* 11.9* 14.0*  HGB 11.7* 10.0* 10.2*  HCT 35.4* 31.2* 33.0*  PLT 263 181 PLATELET CLUMPS NOTED ON SMEAR, COUNT APPEARS ADEQUATE    Coag's Recent Labs  Lab 12/26/17 0352 01/12/2018 0248 12/25/2017 2314  INR 3.01 2.95 4.79*    Sepsis Markers Recent Labs  Lab 01/10/2018 2205 2018-01-21 0059 2018-01-21 0156  LATICACIDVEN 11.7*  --  11.4*  PROCALCITON  --  57.98  --     ABG Recent Labs  Lab 01/21/18 0350  PHART 7.190*  PCO2ART 42.0  PO2ART 79.0*    Liver Enzymes Recent Labs  Lab Jan 21, 2018 0059  AST 226*  ALT 78*  ALKPHOS 277*  BILITOT 2.2*  ALBUMIN 1.7*    Cardiac Enzymes Recent Labs  Lab 01/16/2018 2314 21-Jan-2018 0457  TROPONINI 1.13* 2.58*    Glucose No results for input(s): GLUCAP in the last 168 hours.  Imaging Dg Chest Port 1 View  Result Date: 01/01/2018 CLINICAL DATA:  Cardiac arrest EXAM: PORTABLE CHEST 1 VIEW COMPARISON:  12/12/2017 FINDINGS: Endotracheal tube placed with tip measuring 3.1 cm above the carina.  Power port type central venous catheter with tip over the low SVC region. No pneumothorax. Patient rotation limits examination. Shallow inspiration. Mild cardiac enlargement. No vascular congestion or edema. Consolidation in the right upper lung is new since previous studies and likely represents pneumonia. This could indicate aspiration. Prominent mediastinal soft tissue corresponding to prominent lymphadenopathy on the previous CT from 11/29/2017. Small bilateral pleural effusions are suggested. Degenerative changes in the spine. IMPRESSION: 1. Appliances appear in satisfactory location. 2. New consolidation in the right upper lung likely representing pneumonia, possibly due to aspiration. 3.  Mediastinal soft tissue mass consistent with known lymphadenopathy. 4. Cardiac enlargement.  Small pleural effusions. Electronically Signed   By: Lucienne Capers M.D.   On: 01/13/2018 23:02    STUDIES:  Chest DG following Cardiac arrest  CULTURES: Blood Cx 1/6 >>  ANTIBIOTICS: Vanc 1/6 >> Zosyn 1/6 >>  SIGNIFICANT EVENTS: PEA arrest 1/6 >> Intubated ROSC attained   LINES/TUBES: PIV left wrist 1/05>>  DISCUSSION: 59 yof presented with PEA arrest with PMHx of B-cell non hodkins lymphoma, A-fib w/ chronic coumadin, HFpEF, HTN, previous TIA. She was found with agonal breaths and pulseless. ACLS was followed, patient was intubated and taken to ICU. Currently on maximum levophed, epinephrine, and on vasopressin having received three liters of fluids. Continuing full spectrum of care, patient is now DNR given status and poor prognosis.   ASSESSMENT / PLAN:  PULMONARY A: Continue PRVC at current settings ABG on 1/6--- 7.190/42/79/15.4 P:   Continue ventilator support Continue to support pressure and monitor vitals Maintaining oxygen saturation above 95% on exam  CARDIOVASCULAR A:  Cardiogenic shock, Patient cold, clammy with weak pulse Interstitial volume increased, intravascular maintaining but perfusion limited P:  Pressure support with levophed, phenylephrine, and vasopressin  IV fluid boluses as needed Echo ordered, no need to complete at this time Follow cardiac enzymes for completion, initial 1.13, up to 2.58, next at 1130 hours Code status DNR now  RENAL/FEN: A:   Hyperkalemia Hyponatremia AKI P:   Given calcium chloride and bicarbonate for hyperkalemia  K+ 5.5, Cl 90, Na 131 on 1/6 Mag 1.7 given 2 grams, repeat ordered Phos pending  HEMATOLOGIC A:   Hgb stable P:  Holding DVT ppx for now  INFECTIOUS A:   No clear infectious source noted Severe lactic acidosis  Blood cultures taken P:   Started Vanc/zosyn Continue pressure support as above LA 11.4,  continues to be elevated  NEUROLOGIC A:   Altered since PEA arrest. ROSC obtained but unresponsive since 0300 hours.  P:   RASS goal: -2  FAMILY  - Updates: Family updated at bedside.  - Inter-disciplinary family meet or Palliative Care meeting due by:  day 7  Kathi Ludwig, MD  2017/12/31, 7:24 AM

## 2018-01-23 NOTE — Progress Notes (Signed)
Patients pupils fixed and dilated, no heart tones, absent pulses. Verified by Grandville Silos time of death 10:23am.

## 2018-01-23 NOTE — Progress Notes (Signed)
CRITICAL VALUE ALERT  Critical Value:  Latic Acid  Date & Time Notied:  9357 01-25-2018  Provider Notified: Dr. Baltazar Apo  Orders Received/Actions taken: none

## 2018-01-23 NOTE — Progress Notes (Signed)
PCCM Interval Note   Family at bedside (husband and grand-daughter). Patient currently on maximum dose vasopressin, neo, and levophed with progressive hypotension/bradycardia. Family understand severity of illness and agree to no CPR. Would like to continue current medical management and ventilator support. Code status updated in EMR.   Hayden Pedro, AGACNP-BC [facility]  Pgr: [phone]  PCCM Pgr: [phone]

## 2018-01-23 DEATH — deceased

## 2018-05-14 IMAGING — US US BIOPSY LYMPH NODE
1 series · 13 of 16 positions shown · non-contrast
Comparison: none

INDICATION: 82-year-old female with presumed lymphoma.

[Series 1: us biopsy lymph node · 0.09mm/px · 13 of 16 slices shown]
[im 1/16]
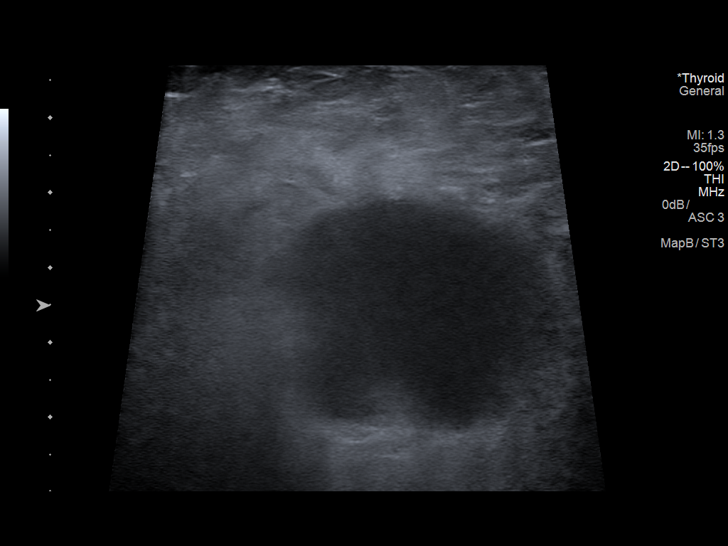
[im 2/16]
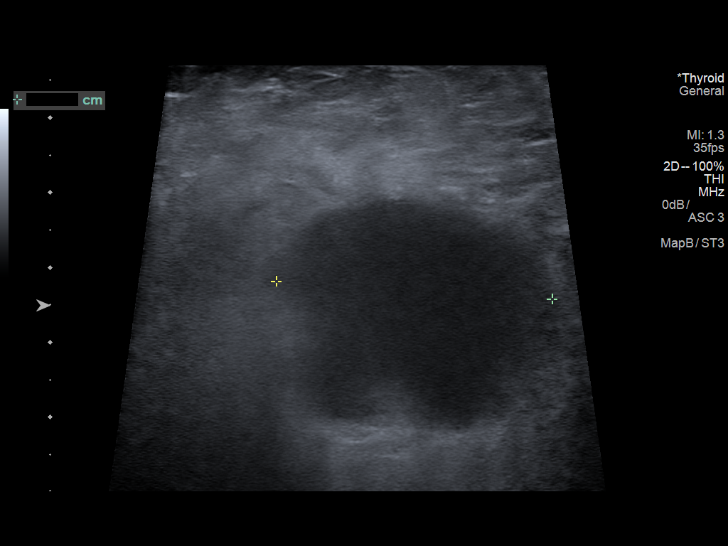
[im 4/16]
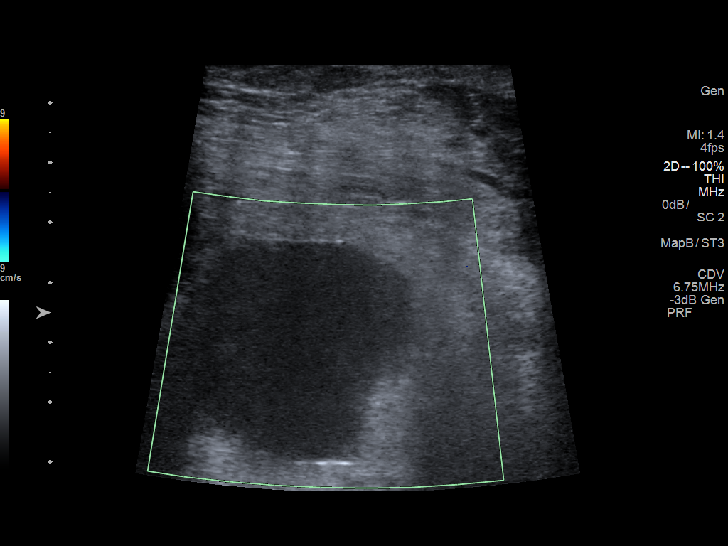
[im 5/16]
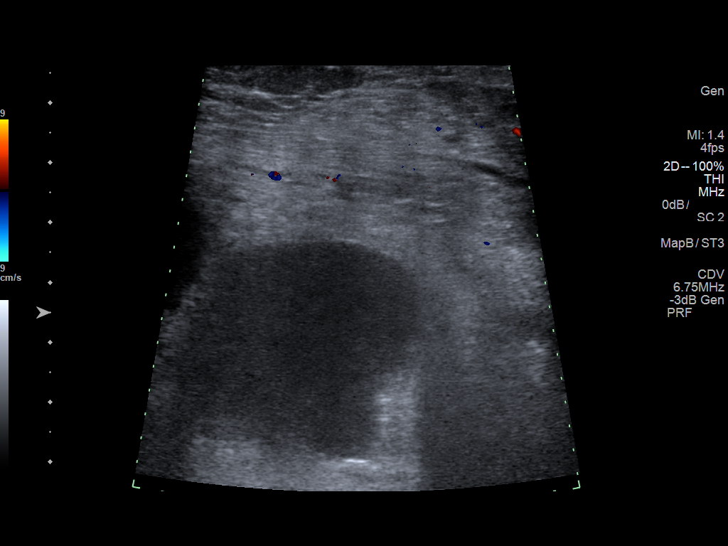
[im 6/16]
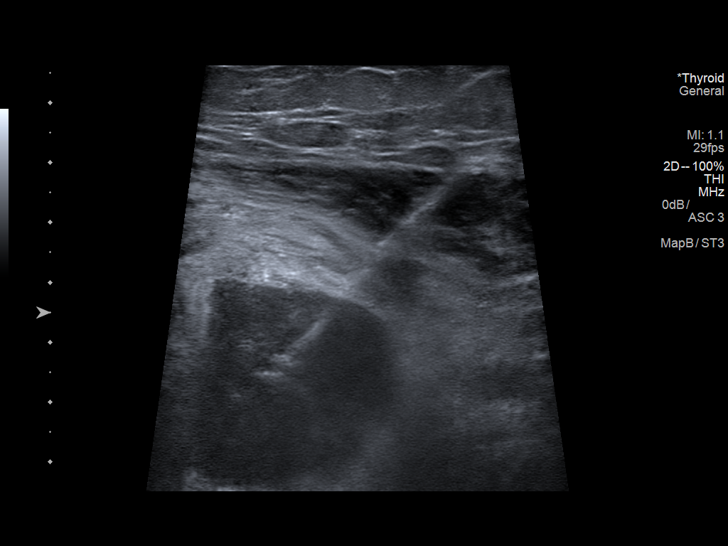
[im 7/16]
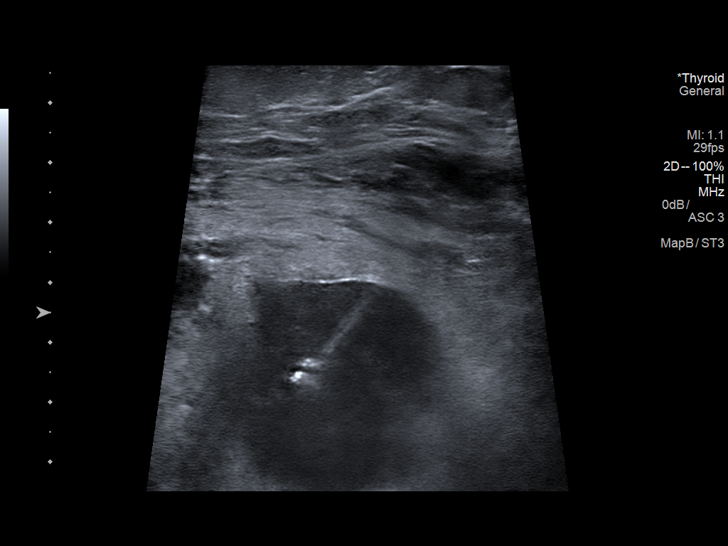
[im 9/16]
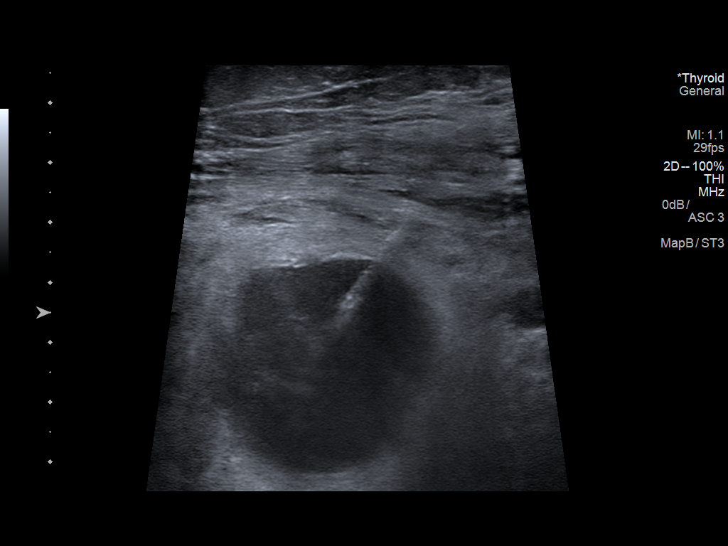
[im 10/16]
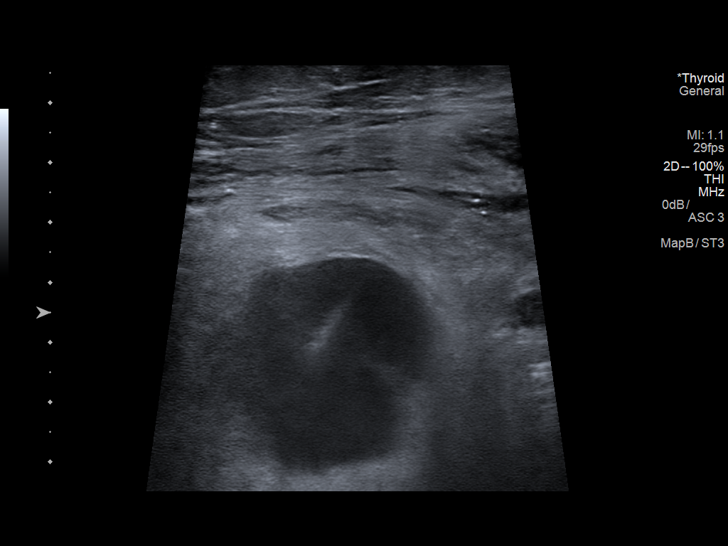
[im 11/16]
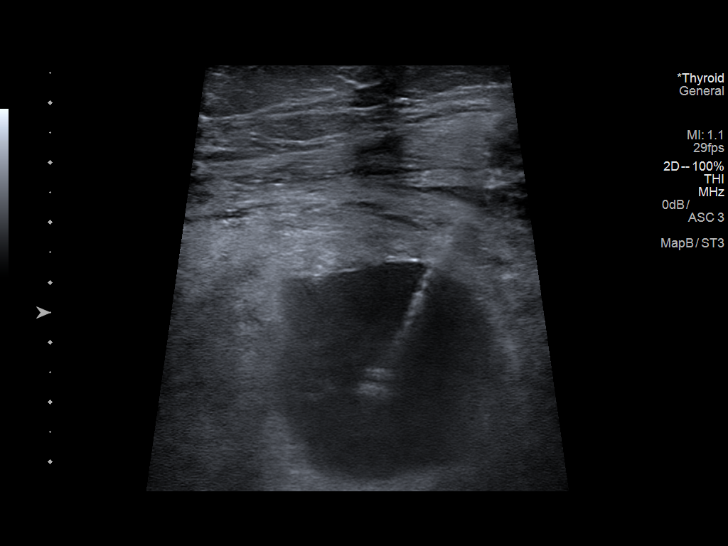
[im 12/16]
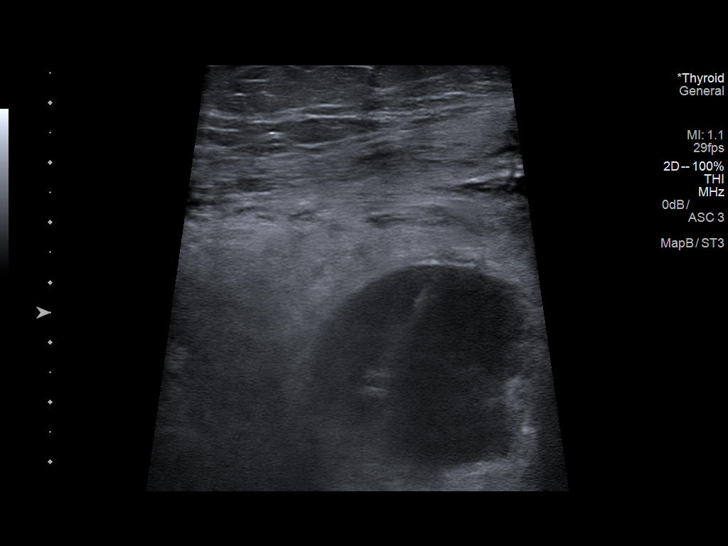
[im 13/16]
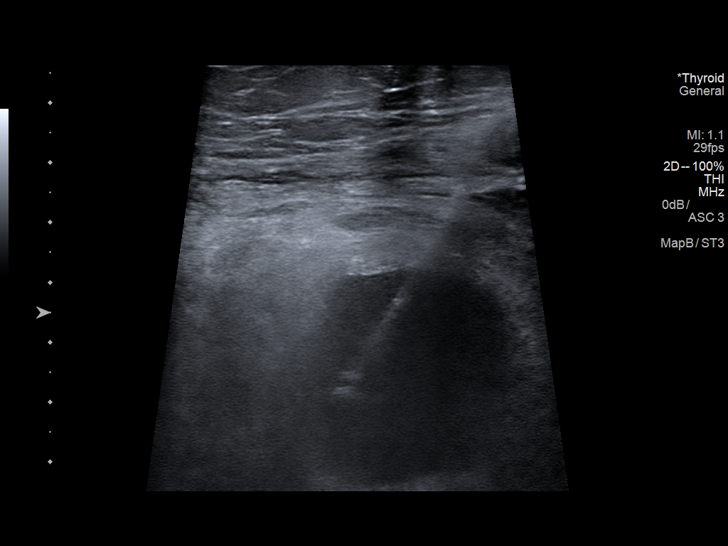
[im 15/16]
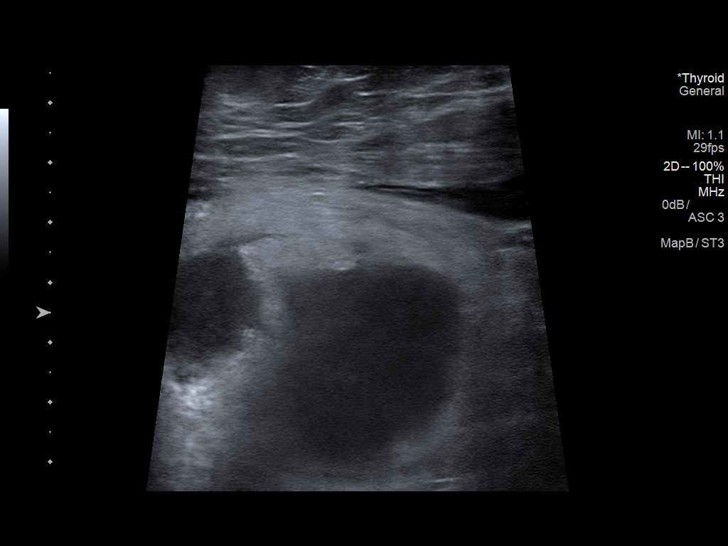
[im 16/16]
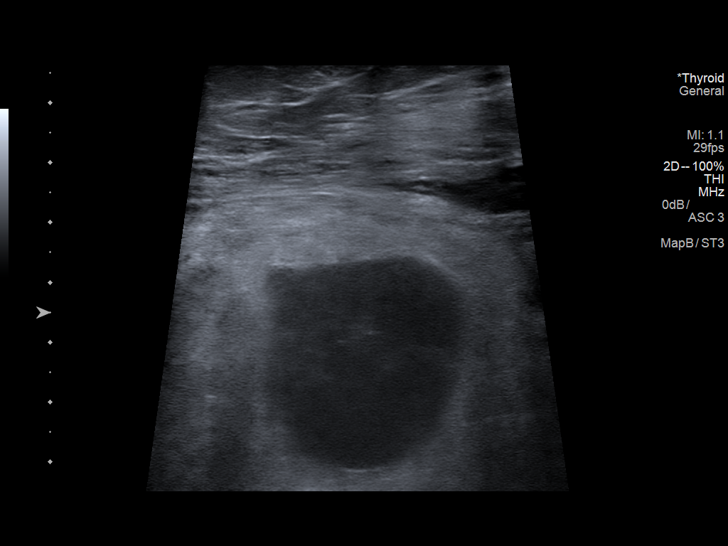

[13 of 16 positions shown; findings below may reference images not displayed]

She presents for biopsy of nodal mass of the left axilla.

EXAM:
ULTRASOUND-GUIDED LYMPH NODE BIOPSY

MEDICATIONS:
None.

ANESTHESIA/SEDATION:
Moderate (conscious) sedation was employed during this procedure. A
total of Versed 2.0 mg and Fentanyl 50 mcg was administered
intravenously.

Moderate Sedation Time: 10 minutes. The patient's level of
consciousness and vital signs were monitored continuously by
radiology nursing throughout the procedure under my direct
supervision.

FLUOROSCOPY TIME:  None

COMPLICATIONS:
None

PROCEDURE:
Informed written consent was obtained from the patient after a
thorough discussion of the procedural risks, benefits and
alternatives. All questions were addressed. Maximal Sterile Barrier
Technique was utilized including caps, mask, sterile gowns, sterile
gloves, sterile drape, hand hygiene and skin antiseptic. A timeout
was performed prior to the initiation of the procedure.

Patient positioned supine position on the ultrasound stretcher.
Ultrasound images of the left axillary region were performed with
images stored and sent to PACs.

The patient is prepped and draped in the usual sterile fashion. The
skin and subcutaneous tissues were generously infiltrated 1%
lidocaine for local anesthesia. Using ultrasound guidance, at least
8 separate 18 gauge core biopsy of the left axillary node were
performed.

Specimen placed in the saline.

Final image was stored.

Patient tolerated the procedure well and remained hemodynamically
stable throughout.

No complications were encountered and no significant blood loss.
IMPRESSION: Status post ultrasound-guided biopsy of left axillary mass. Tissue
specimen sent to pathology for complete histopathologic analysis.
